# Patient Record
Sex: Male | Born: 1940
Health system: Southern US, Community
[De-identification: ages and names within clinical notes are randomized; demographics above are authoritative.]

## PROBLEM LIST (undated history)

## (undated) DIAGNOSIS — M5416 Radiculopathy, lumbar region: Secondary | ICD-10-CM

## (undated) DIAGNOSIS — C259 Malignant neoplasm of pancreas, unspecified: Secondary | ICD-10-CM

## (undated) DIAGNOSIS — R1900 Intra-abdominal and pelvic swelling, mass and lump, unspecified site: Secondary | ICD-10-CM

## (undated) DIAGNOSIS — K8689 Other specified diseases of pancreas: Secondary | ICD-10-CM

## (undated) DIAGNOSIS — C3490 Malignant neoplasm of unspecified part of unspecified bronchus or lung: Secondary | ICD-10-CM

## (undated) DIAGNOSIS — M1990 Unspecified osteoarthritis, unspecified site: Secondary | ICD-10-CM

## (undated) DIAGNOSIS — I639 Cerebral infarction, unspecified: Secondary | ICD-10-CM

## (undated) DIAGNOSIS — I8290 Acute embolism and thrombosis of unspecified vein: Secondary | ICD-10-CM

## (undated) DIAGNOSIS — I82409 Acute embolism and thrombosis of unspecified deep veins of unspecified lower extremity: Secondary | ICD-10-CM

## (undated) DIAGNOSIS — I10 Essential (primary) hypertension: Secondary | ICD-10-CM

## (undated) DIAGNOSIS — E119 Type 2 diabetes mellitus without complications: Secondary | ICD-10-CM

## (undated) DIAGNOSIS — J439 Emphysema, unspecified: Secondary | ICD-10-CM

## (undated) DIAGNOSIS — I700 Atherosclerosis of aorta: Secondary | ICD-10-CM

## (undated) HISTORY — DX: Cerebral infarction, unspecified: I63.9

## (undated) HISTORY — DX: Acute embolism and thrombosis of unspecified deep veins of unspecified lower extremity: I82.409

## (undated) HISTORY — DX: Atherosclerosis of aorta: I70.0

## (undated) HISTORY — PX: CYST REMOVAL LEG: SHX6280

## (undated) HISTORY — DX: Malignant neoplasm of unspecified part of unspecified bronchus or lung: C34.90

## (undated) HISTORY — PX: TONSILLECTOMY: SUR1361

## (undated) HISTORY — DX: Unspecified osteoarthritis, unspecified site: M19.90

## (undated) HISTORY — DX: Emphysema, unspecified: J43.9

## (undated) HISTORY — PX: APPENDECTOMY: SHX54

---

## 2001-10-27 ENCOUNTER — Ambulatory Visit (HOSPITAL_COMMUNITY): Admission: AD | Admit: 2001-10-27 | Discharge: 2001-10-27 | Payer: Self-pay | Admitting: General Surgery

## 2001-10-27 ENCOUNTER — Encounter: Payer: Self-pay | Admitting: General Surgery

## 2003-02-25 ENCOUNTER — Inpatient Hospital Stay (HOSPITAL_COMMUNITY): Admission: EM | Admit: 2003-02-25 | Discharge: 2003-02-28 | Payer: Self-pay | Admitting: Emergency Medicine

## 2003-02-26 ENCOUNTER — Encounter: Payer: Self-pay | Admitting: Emergency Medicine

## 2003-03-07 ENCOUNTER — Encounter: Admission: RE | Admit: 2003-03-07 | Discharge: 2003-03-07 | Payer: Self-pay | Admitting: Infectious Diseases

## 2003-03-14 ENCOUNTER — Encounter: Admission: RE | Admit: 2003-03-14 | Discharge: 2003-03-14 | Payer: Self-pay | Admitting: Internal Medicine

## 2003-03-21 ENCOUNTER — Encounter: Admission: RE | Admit: 2003-03-21 | Discharge: 2003-03-21 | Payer: Self-pay | Admitting: Internal Medicine

## 2003-04-07 ENCOUNTER — Encounter: Admission: RE | Admit: 2003-04-07 | Discharge: 2003-04-07 | Payer: Self-pay | Admitting: Internal Medicine

## 2003-04-18 ENCOUNTER — Encounter: Admission: RE | Admit: 2003-04-18 | Discharge: 2003-04-18 | Payer: Self-pay | Admitting: Internal Medicine

## 2003-05-02 ENCOUNTER — Encounter: Admission: RE | Admit: 2003-05-02 | Discharge: 2003-05-02 | Payer: Self-pay | Admitting: Internal Medicine

## 2003-05-16 ENCOUNTER — Encounter: Admission: RE | Admit: 2003-05-16 | Discharge: 2003-05-16 | Payer: Self-pay | Admitting: Internal Medicine

## 2003-06-06 ENCOUNTER — Encounter: Admission: RE | Admit: 2003-06-06 | Discharge: 2003-06-06 | Payer: Self-pay | Admitting: Internal Medicine

## 2003-06-27 ENCOUNTER — Encounter: Admission: RE | Admit: 2003-06-27 | Discharge: 2003-06-27 | Payer: Self-pay | Admitting: Internal Medicine

## 2003-07-25 ENCOUNTER — Encounter: Admission: RE | Admit: 2003-07-25 | Discharge: 2003-07-25 | Payer: Self-pay | Admitting: Internal Medicine

## 2003-08-15 ENCOUNTER — Encounter: Admission: RE | Admit: 2003-08-15 | Discharge: 2003-08-15 | Payer: Self-pay | Admitting: Internal Medicine

## 2003-09-07 ENCOUNTER — Encounter: Admission: RE | Admit: 2003-09-07 | Discharge: 2003-09-07 | Payer: Self-pay | Admitting: Internal Medicine

## 2003-09-12 ENCOUNTER — Encounter: Admission: RE | Admit: 2003-09-12 | Discharge: 2003-09-12 | Payer: Self-pay | Admitting: Internal Medicine

## 2003-10-10 ENCOUNTER — Encounter: Admission: RE | Admit: 2003-10-10 | Discharge: 2003-10-10 | Payer: Self-pay | Admitting: Internal Medicine

## 2003-10-13 ENCOUNTER — Encounter: Admission: RE | Admit: 2003-10-13 | Discharge: 2003-10-13 | Payer: Self-pay | Admitting: Internal Medicine

## 2013-11-26 ENCOUNTER — Encounter (HOSPITAL_COMMUNITY): Payer: Self-pay | Admitting: Emergency Medicine

## 2013-11-26 ENCOUNTER — Inpatient Hospital Stay (HOSPITAL_COMMUNITY)
Admission: EM | Admit: 2013-11-26 | Discharge: 2013-11-30 | DRG: 041 | Disposition: A | Discharge status: Rehabilitation Facility | Payer: Medicare Other | Attending: Family Medicine | Admitting: Family Medicine

## 2013-11-26 ENCOUNTER — Emergency Department (HOSPITAL_COMMUNITY): Payer: Medicare Other

## 2013-11-26 DIAGNOSIS — R29810 Facial weakness: Secondary | ICD-10-CM | POA: Diagnosis present

## 2013-11-26 DIAGNOSIS — E119 Type 2 diabetes mellitus without complications: Secondary | ICD-10-CM | POA: Diagnosis present

## 2013-11-26 DIAGNOSIS — I6340 Cerebral infarction due to embolism of unspecified cerebral artery: Principal | ICD-10-CM | POA: Diagnosis present

## 2013-11-26 DIAGNOSIS — G8190 Hemiplegia, unspecified affecting unspecified side: Secondary | ICD-10-CM | POA: Diagnosis present

## 2013-11-26 DIAGNOSIS — E785 Hyperlipidemia, unspecified: Secondary | ICD-10-CM | POA: Diagnosis present

## 2013-11-26 DIAGNOSIS — I639 Cerebral infarction, unspecified: Secondary | ICD-10-CM | POA: Diagnosis present

## 2013-11-26 DIAGNOSIS — R5381 Other malaise: Secondary | ICD-10-CM | POA: Diagnosis not present

## 2013-11-26 DIAGNOSIS — E875 Hyperkalemia: Secondary | ICD-10-CM | POA: Diagnosis present

## 2013-11-26 DIAGNOSIS — R4789 Other speech disturbances: Secondary | ICD-10-CM | POA: Diagnosis present

## 2013-11-26 DIAGNOSIS — R29818 Other symptoms and signs involving the nervous system: Secondary | ICD-10-CM | POA: Diagnosis not present

## 2013-11-26 DIAGNOSIS — Z5189 Encounter for other specified aftercare: Secondary | ICD-10-CM | POA: Diagnosis not present

## 2013-11-26 DIAGNOSIS — F17200 Nicotine dependence, unspecified, uncomplicated: Secondary | ICD-10-CM | POA: Diagnosis present

## 2013-11-26 DIAGNOSIS — I6350 Cerebral infarction due to unspecified occlusion or stenosis of unspecified cerebral artery: Secondary | ICD-10-CM | POA: Diagnosis not present

## 2013-11-26 DIAGNOSIS — G8110 Spastic hemiplegia affecting unspecified side: Secondary | ICD-10-CM | POA: Diagnosis not present

## 2013-11-26 DIAGNOSIS — R29898 Other symptoms and signs involving the musculoskeletal system: Secondary | ICD-10-CM | POA: Diagnosis not present

## 2013-11-26 DIAGNOSIS — I517 Cardiomegaly: Secondary | ICD-10-CM | POA: Diagnosis not present

## 2013-11-26 DIAGNOSIS — I10 Essential (primary) hypertension: Secondary | ICD-10-CM | POA: Diagnosis present

## 2013-11-26 DIAGNOSIS — I6330 Cerebral infarction due to thrombosis of unspecified cerebral artery: Secondary | ICD-10-CM | POA: Diagnosis not present

## 2013-11-26 DIAGNOSIS — Z86718 Personal history of other venous thrombosis and embolism: Secondary | ICD-10-CM

## 2013-11-26 DIAGNOSIS — Z79899 Other long term (current) drug therapy: Secondary | ICD-10-CM

## 2013-11-26 DIAGNOSIS — Z7982 Long term (current) use of aspirin: Secondary | ICD-10-CM

## 2013-11-26 DIAGNOSIS — R5383 Other fatigue: Secondary | ICD-10-CM | POA: Diagnosis not present

## 2013-11-26 DIAGNOSIS — B370 Candidal stomatitis: Secondary | ICD-10-CM | POA: Diagnosis not present

## 2013-11-26 HISTORY — DX: Acute embolism and thrombosis of unspecified vein: I82.90

## 2013-11-26 HISTORY — DX: Unspecified osteoarthritis, unspecified site: M19.90

## 2013-11-26 HISTORY — DX: Essential (primary) hypertension: I10

## 2013-11-26 HISTORY — DX: Type 2 diabetes mellitus without complications: E11.9

## 2013-11-26 HISTORY — DX: Cerebral infarction, unspecified: I63.9

## 2013-11-26 LAB — BASIC METABOLIC PANEL
BUN: 9 mg/dL (ref 6–23)
CALCIUM: 9.4 mg/dL (ref 8.4–10.5)
CO2: 22 mEq/L (ref 19–32)
CREATININE: 0.89 mg/dL (ref 0.50–1.35)
Chloride: 102 mEq/L (ref 96–112)
GFR calc non Af Amer: 82 mL/min — ABNORMAL LOW (ref >90)
Glucose, Bld: 117 mg/dL — ABNORMAL HIGH (ref 70–99)
Potassium: 3.9 mEq/L (ref 3.7–5.3)
Sodium: 140 mEq/L (ref 137–147)

## 2013-11-26 LAB — CBC
HEMATOCRIT: 43.7 % (ref 39.0–52.0)
Hemoglobin: 14.5 g/dL (ref 13.0–17.0)
MCH: 26.4 pg (ref 26.0–34.0)
MCHC: 33.2 g/dL (ref 30.0–36.0)
MCV: 79.5 fL (ref 78.0–100.0)
PLATELETS: 327 10*3/uL (ref 150–400)
RBC: 5.5 MIL/uL (ref 4.22–5.81)
RDW: 16.1 % — AB (ref 11.5–15.5)
WBC: 8.6 10*3/uL (ref 4.0–10.5)

## 2013-11-26 LAB — RAPID URINE DRUG SCREEN, HOSP PERFORMED
Amphetamines: NOT DETECTED
BARBITURATES: NOT DETECTED
Benzodiazepines: NOT DETECTED
COCAINE: NOT DETECTED
Opiates: NOT DETECTED
TETRAHYDROCANNABINOL: NOT DETECTED

## 2013-11-26 LAB — DIFFERENTIAL
BASOS PCT: 0 % (ref 0–1)
Basophils Absolute: 0 10*3/uL (ref 0.0–0.1)
Eosinophils Absolute: 0 10*3/uL (ref 0.0–0.7)
Eosinophils Relative: 0 % (ref 0–5)
LYMPHS PCT: 24 % (ref 12–46)
Lymphs Abs: 2.1 10*3/uL (ref 0.7–4.0)
MONO ABS: 0.4 10*3/uL (ref 0.1–1.0)
Monocytes Relative: 5 % (ref 3–12)
NEUTROS PCT: 70 % (ref 43–77)
Neutro Abs: 6 10*3/uL (ref 1.7–7.7)

## 2013-11-26 LAB — URINALYSIS, ROUTINE W REFLEX MICROSCOPIC
Bilirubin Urine: NEGATIVE
Glucose, UA: NEGATIVE mg/dL
HGB URINE DIPSTICK: NEGATIVE
KETONES UR: NEGATIVE mg/dL
Nitrite: NEGATIVE
PROTEIN: NEGATIVE mg/dL
Specific Gravity, Urine: 1.019 (ref 1.005–1.030)
Urobilinogen, UA: 1 mg/dL (ref 0.0–1.0)
pH: 6 (ref 5.0–8.0)

## 2013-11-26 LAB — COMPREHENSIVE METABOLIC PANEL
ALK PHOS: 82 U/L (ref 39–117)
ALT: 18 U/L (ref 0–53)
AST: 25 U/L (ref 0–37)
Albumin: 3.9 g/dL (ref 3.5–5.2)
BILIRUBIN TOTAL: 0.6 mg/dL (ref 0.3–1.2)
BUN: 10 mg/dL (ref 6–23)
CO2: 21 meq/L (ref 19–32)
CREATININE: 0.85 mg/dL (ref 0.50–1.35)
Calcium: 9.5 mg/dL (ref 8.4–10.5)
Chloride: 101 mEq/L (ref 96–112)
GFR calc Af Amer: >90 mL/min (ref >90)
GFR, EST NON AFRICAN AMERICAN: 84 mL/min — AB (ref >90)
Glucose, Bld: 129 mg/dL — ABNORMAL HIGH (ref 70–99)
POTASSIUM: 5.3 meq/L (ref 3.7–5.3)
SODIUM: 138 meq/L (ref 137–147)
Total Protein: 8.3 g/dL (ref 6.0–8.3)

## 2013-11-26 LAB — URINE MICROSCOPIC-ADD ON

## 2013-11-26 LAB — ETHANOL: Alcohol, Ethyl (B): <11 mg/dL (ref 0–11)

## 2013-11-26 LAB — APTT: APTT: 33 s (ref 24–37)

## 2013-11-26 LAB — I-STAT TROPONIN, ED: TROPONIN I, POC: 0 ng/mL (ref 0.00–0.08)

## 2013-11-26 LAB — PROTIME-INR
INR: 1.09 (ref 0.00–1.49)
Prothrombin Time: 13.9 seconds (ref 11.6–15.2)

## 2013-11-26 MED ORDER — ENOXAPARIN SODIUM 40 MG/0.4ML ~~LOC~~ SOLN
40.0000 mg | SUBCUTANEOUS | Status: DC
  Administered 2013-11-27 – 2013-11-28 (×2): 40 mg via SUBCUTANEOUS
  Filled 2013-11-26 (×3): qty 0.4

## 2013-11-26 MED ORDER — ASPIRIN 325 MG PO TABS
325.0000 mg | ORAL_TABLET | Freq: Every day | ORAL | Status: DC
  Administered 2013-11-26 – 2013-11-30 (×5): 325 mg via ORAL
  Filled 2013-11-26 (×5): qty 1

## 2013-11-26 MED ORDER — HYDRALAZINE HCL 20 MG/ML IJ SOLN
5.0000 mg | Freq: Once | INTRAMUSCULAR | Status: AC
  Administered 2013-11-27: 5 mg via INTRAVENOUS
  Filled 2013-11-26: qty 1

## 2013-11-26 MED ORDER — HYDRALAZINE HCL 20 MG/ML IJ SOLN: 10.0000 mg | Freq: Four times a day (QID) | INTRAMUSCULAR | Status: DC | PRN

## 2013-11-26 MED ORDER — HYDRALAZINE HCL 20 MG/ML IJ SOLN
5.0000 mg | Freq: Four times a day (QID) | INTRAMUSCULAR | Status: DC | PRN
  Administered 2013-11-26: 21:00:00 via INTRAVENOUS
  Filled 2013-11-26: qty 0.25

## 2013-11-26 NOTE — Progress Notes (Signed)
  CARE MANAGEMENT ED NOTE 11/26/2013  Patient:  NORI, POLAND   Account Number:  [id number]  Date Initiated:  11/26/2013  Documentation initiated by:  Livia Snellen  Subjective/Objective Assessment:   Patient presents to Ed with left sided weakness and difficulty ambulating.     Subjective/Objective Assessment Detail:     Action/Plan:   Action/Plan Detail:   Anticipated DC Date:       Status Recommendation to Physician:   Result of Recommendation:    Other ED Services  Consult Working [place]  CM consult  Other    Choice offered to / List presented to:  C-4 Adult Children          Status of service:  Completed, signed off  ED Comments:   ED Comments Detail:  EDCM spoke to patient and his daughter Tyrel Lex.  Pam's phone number [phone].  Patient lives at home alone. Patient reports he does not have any medical equipment at home and does not have any home health services at this time.  Patient reports he is usually able to perform his ADL's without difficulty however he requires assistance now because, "I cannt use this left arm."  [facility] provided patient with printed list of home health agencies in [facility].  [facility] informed patient that with home health, the patient may receive a visiting RN, PT, OT, aid and social worker if needed.   EDCM also provided patient a list of private duty nursing agencies and explained it would be an out of pocket expense for patinet.  Also provided patient with printed information about the [employer] [facility] [place].  All printed materials given to patient's daughter.  Patient and patient's daughter thankful for resources.  No further EDCM needs at this time.

## 2013-11-26 NOTE — ED Provider Notes (Signed)
Medical screening examination/treatment/procedure(s) were performed by non-physician practitioner and as supervising physician I was immediately available for consultation/collaboration.   EKG Interpretation   Date/Time:  Friday Nov 26 2013 17:45:27 EDT Ventricular Rate:  95 PR Interval:  197 QRS Duration: 123 QT Interval:  409 QTC Calculation: 514 R Axis:   -48 Text Interpretation:  Sinus rhythm Nonspecific IVCD with LAD LVH with  secondary repolarization abnormality Anterolateral Q waves, probably due  to LVH Baseline wander in lead(s) V5 Confirmed by Zenia Resides  MD, Jaquala Fuller  ([id number]) on 11/26/2013 5:48:45 PM        Leota Jacobsen, MD 11/26/13 2308

## 2013-11-26 NOTE — ED Notes (Signed)
Patient lives by himself and started noticing some left sided weakness.  He went to bed then this morning noticed slurred speech and his left sided weakness had worsened.  Patient has no history of previous stroke.

## 2013-11-26 NOTE — ED Provider Notes (Signed)
Medical screening examination/treatment/procedure(s) were conducted as a shared visit with non-physician practitioner(s) and myself.  I personally evaluated the patient during the encounter.   EKG Interpretation   Date/Time:  Friday Nov 26 2013 17:45:27 EDT Ventricular Rate:  95 PR Interval:  197 QRS Duration: 123 QT Interval:  409 QTC Calculation: 514 R Axis:   -48 Text Interpretation:  Sinus rhythm Nonspecific IVCD with LAD LVH with  secondary repolarization abnormality Anterolateral Q waves, probably due  to LVH Baseline wander in lead(s) V5 Confirmed by Candyce Gambino  MD, Teren Franckowiak  ([id number]) on 11/26/2013 5:48:45 PM     Patient here complaining of left-sided weakness that began yesterday. He's had trouble walking. On physical exam, he has 3/5 strength in his left upper extremity. No slurred speech. Will obtain head CT. Suspect that patient has had a stroke and will require hospitalization.  Leota Jacobsen, MD 11/26/13 [phone]

## 2013-11-26 NOTE — ED Notes (Signed)
Browning PA made aware of patient chem 8 results.

## 2013-11-26 NOTE — H&P (Signed)
[facility] History and Physical  Scott Conley OEV:[id number] DOB: 06/12/1941 DOA: 11/26/2013  Referring physician: ED physician PCP: No primary provider on file.   Chief Complaint:   HPI:  Pt is 73 yo male who presented to [facility] ED with main concern of sudden onset of left sided weakness and slurred speech and was last seen at his baseline 6 pm night prior to this admission. Pt says he woke up with slurred speech and has noticed weakness was getting worse on the left side. He denies similar events in the past, no chest pain or shortness of breath, no abdominal or urinary concerns.   In ED, pt is hemodynamically stable but SBP noted to be in 200's. Initial BMP unremarkable and I-STAT BMP with K 6.6. CT Head with small age-indeterminate high right frontoparietal cortical infarct. TRH asked to admit to [facility] hospital for stroke work up.   Assessment and Plan: Active Problems: Left sided weakness, slurred speech - will transfer to [facility] for further evaluation - MRI/MRA brain for further evaluation - lipid panel and A1C ordered, 2 D ECHO and carotid doppler - PT/OT/SLP - aspirin 325 mg PO given in ED - will continue once passes swallow evaluation  - neurology consulted, appreciate input  Hyperkalemia - will repeat BMP to ensure no blood error  Accelerated HTN - with SBP in 200 on arrival to ED - will place on Hydralazine IV scheduled and as needed for now - will need antihypertensive regimen adjusted but will avoid lowering BP too fast at this point  Radiological Exams on Admission: Ct Head Wo Contrast  11/26/2013   Small age-indeterminate high right frontoparietal cortical infarct - no evidence of hemorrhage.  Chronic small-vessel white matter ischemic changes.    Code Status: Full Family Communication: Pt at bedside Disposition Plan: Admit for further evaluation     Review of Systems:  Constitutional:  Negative for diaphoresis.  HENT: Negative for hearing loss, congestion,  sore throat, neck pain, tinnitus and ear discharge.   Eyes: Negative for blurred vision, double vision, photophobia, pain, discharge and redness.  Respiratory: Negative for cough, hemoptysis, sputum production, shortness of breath, wheezing and stridor.   Cardiovascular: Negative for chest pain, palpitations, orthopnea, claudication and leg swelling.  Gastrointestinal: Negative for heartburn, constipation, blood in stool and melena.  Genitourinary: Negative for dysuria, urgency, frequency, hematuria and flank pain.  Musculoskeletal: Negative for myalgias, back pain, joint pain and falls.  Skin: Negative for itching and rash.  Neurological: Negative for tingling, tremors, loss of consciousness and headaches.  Endo/Heme/Allergies: Negative for environmental allergies and polydipsia. Does not bruise/bleed easily.  Psychiatric/Behavioral: Negative for suicidal ideas. The patient is not nervous/anxious.      Past Medical History  Diagnosis Date  . Blood clot in vein   . Arthritis     Past Surgical History  Procedure Laterality Date  . Cyst removal leg      removed from right groin    Social History:  reports that he has been smoking Cigarettes.  He has a 29 pack-year smoking history. He has never used smokeless tobacco. He reports that he does not drink alcohol or use illicit drugs.  No Known Allergies  No known family medical history   Prior to Admission medications   Medication Sig Start Date End Date Taking? Authorizing Provider  diphenhydrAMINE (BENADRYL) 25 mg capsule Take 25 mg by mouth at bedtime as needed for sleep.   Yes Historical Provider, MD    Physical Exam: Filed Vitals:  11/26/13 1741 11/26/13 1745 11/26/13 1800 11/26/13 1900  BP:  192/96 181/94 178/91  Pulse:  95 95 94  Temp: 97.7 F (36.5 C)     TempSrc:      Resp:  0 18 19  Height:      Weight:      SpO2:  97% 97% 97%    Physical Exam  Constitutional: Appears well-developed and well-nourished. No  distress.  HENT: Normocephalic. External right and left ear normal. Oropharynx is clear and moist.  Eyes: Conjunctivae and EOM are normal. PERRLA, no scleral icterus.  Neck: Normal ROM. Neck supple. No JVD. No tracheal deviation. No thyromegaly.  CVS: RRR, S1/S2 +, no murmurs, no gallops, no carotid bruit.  Pulmonary: Effort and breath sounds normal, no stridor, rhonchi, wheezes, rales.  Abdominal: Soft. BS +,  no distension, tenderness, rebound or guarding.  Musculoskeletal: Normal range of motion. No edema and no tenderness.  Lymphadenopathy: No lymphadenopathy noted, cervical, inguinal. Neuro: Alert, follows commands appropriately, mild aphasia noted, CN II - XII intact except asymmetric smile, left upper and lower extremity strength 4/5 with decreased sensation to light touch, strength in right upper and lower extremity 5/5 with intact sensation to light touch.  Skin: Skin is warm and dry. No rash noted. Not diaphoretic. No erythema. No pallor.  Psychiatric: Normal mood and affect.  Labs on Admission:  Basic Metabolic Panel:  Recent Labs Lab 11/26/13 1730 11/26/13 1738  NA 138 137  K 5.3 6.6*  CL 101 106  CO2 21  --   GLUCOSE 129* 128*  BUN 10 11  CREATININE 0.85 1.00  CALCIUM 9.5  --    Liver Function Tests:  Recent Labs Lab 11/26/13 1730  AST 25  ALT 18  ALKPHOS 82  BILITOT 0.6  PROT 8.3  ALBUMIN 3.9   CBC:  Recent Labs Lab 11/26/13 1730 11/26/13 1738  WBC 8.6  --   NEUTROABS 6.0  --   HGB 14.5 16.3  HCT 43.7 48.0  MCV 79.5  --   PLT 327  --     EKG: Normal sinus rhythm, no ST/T wave changes  [name], MD  [facility] Pager [phone]  If 7PM-7AM, please contact night-coverage [email or web address] Password [email or web address] 11/26/2013, 8:01 PM

## 2013-11-26 NOTE — Progress Notes (Addendum)
Clinical Social Work Department BRIEF PSYCHOSOCIAL ASSESSMENT 11/26/2013  Patient:  Scott Conley, Scott Conley     Account Number:  [id number]     Admit date:  11/26/2013  Clinical Social Worker:  Luretha Rued  Date/Time:  11/26/2013 07:00 PM  Referred by:  CSW  Date Referred:  11/26/2013  Other Referral:   Interview type:  Patient Other interview type:   Daughter at bedside    PSYCHOSOCIAL DATA Living Status:  ALONE Admitted from facility:   Level of care:   Primary support name:  Jerrol Helmers Primary support relationship to patient:  CHILD, ADULT Degree of support available:   High level of support    CURRENT CONCERNS  Other Concerns:    SOCIAL WORK ASSESSMENT / PLAN CSW met with the patient and daughter at bedside to complete this assessment. Patient appears alert, oriented x4, calm, and cooperative.   Patient currently lives alone but has his daughters that lives close that frequently check on him.  Patient do not have a POA at this time but his family is very involved with his safety.   Assessment/plan status:  Psychosocial Support/Ongoing Assessment of Needs Other assessment/ plan:   Information/referral to community resources:   SNF    PATIENT'S/FAMILY'S RESPONSE TO PLAN OF CARE: Patient and family expressed their appreciation for the support of the social work department.     Chesley Noon, MSW, [place], 11/26/2013 Evening Clinical Social Worker [phone]

## 2013-11-26 NOTE — ED Provider Notes (Signed)
CSN: [id number]     Arrival date & time 11/26/13  1659 History   First MD Initiated Contact with Patient 11/26/13 1715     Chief Complaint  Patient presents with  . Extremity Weakness     (Consider location/radiation/quality/duration/timing/severity/associated sxs/prior Treatment) HPI Comments: Patient presents to the emergency department with chief complaint of left-sided weakness and slurred speech. He was last known normal last night at 6:00.  This is when the weakness began.  Patient states that he awoke this morning and had some slurred speech.  He denies any history of stroke.  He denies any chest pain, SOB, or abdominal pain.  He has not taken anything to alleviate his symptoms.  There are no aggravating or alleviating factors.  The history is provided by the patient. No language interpreter was used.    Past Medical History  Diagnosis Date  . Blood clot in vein    History reviewed. No pertinent past surgical history. No family history on file. History  Substance Use Topics  . Smoking status: Light Tobacco Smoker    Types: Cigarettes  . Smokeless tobacco: Not on file  . Alcohol Use: No    Review of Systems  Constitutional: Negative for fever and chills.  Respiratory: Negative for shortness of breath.   Cardiovascular: Negative for chest pain.  Gastrointestinal: Negative for nausea, vomiting, diarrhea and constipation.  Genitourinary: Negative for dysuria.  Neurological: Positive for facial asymmetry and weakness.      Allergies  Review of patient's allergies indicates not on file.  Home Medications   Prior to Admission medications   Not on File   BP 192/99  Pulse 99  Temp(Src) 97.7 F (36.5 C) (Oral)  Ht 6\' 4"  (1.93 m)  Wt 235 lb (106.595 kg)  BMI 28.62 kg/m2  SpO2 96% Physical Exam  Nursing note and vitals reviewed. Constitutional: He is oriented to person, place, and time. He appears well-developed and well-nourished.  HENT:  Head: Normocephalic and  atraumatic.  asymmetric smile  Eyes: Conjunctivae and EOM are normal. Pupils are equal, round, and reactive to light. Right eye exhibits no discharge. Left eye exhibits no discharge. No scleral icterus.  Neck: Normal range of motion. Neck supple. No JVD present.  Cardiovascular: Normal rate, regular rhythm and normal heart sounds.  Exam reveals no gallop and no friction rub.   No murmur heard. Pulmonary/Chest: Effort normal and breath sounds normal. No respiratory distress. He has no wheezes. He has no rales. He exhibits no tenderness.  Abdominal: Soft. He exhibits no distension and no mass. There is no tenderness. There is no rebound and no guarding.  Musculoskeletal: Normal range of motion. He exhibits no edema and no tenderness.  4/5 ROM and strength in left upper and lower extremities  Neurological: He is alert and oriented to person, place, and time.  No sensation in left upper and lower extremities   Skin: Skin is warm and dry.  Psychiatric: He has a normal mood and affect. His behavior is normal. Judgment and thought content normal.    ED Course  Procedures (including critical care time) Results for orders placed during the hospital encounter of 11/26/13  ETHANOL      Result Value Ref Range   Alcohol, Ethyl (B) <11  0 - 11 mg/dL  PROTIME-INR      Result Value Ref Range   Prothrombin Time 13.9  11.6 - 15.2 seconds   INR 1.09  0.00 - 1.49  APTT      Result  Value Ref Range   aPTT 33  24 - 37 seconds  CBC      Result Value Ref Range   WBC 8.6  4.0 - 10.5 K/uL   RBC 5.50  4.22 - 5.81 MIL/uL   Hemoglobin 14.5  13.0 - 17.0 g/dL   HCT 43.7  39.0 - 52.0 %   MCV 79.5  78.0 - 100.0 fL   MCH 26.4  26.0 - 34.0 pg   MCHC 33.2  30.0 - 36.0 g/dL   RDW 16.1 (*) 11.5 - 15.5 %   Platelets 327  150 - 400 K/uL  DIFFERENTIAL      Result Value Ref Range   Neutrophils Relative % 70  43 - 77 %   Neutro Abs 6.0  1.7 - 7.7 K/uL   Lymphocytes Relative 24  12 - 46 %   Lymphs Abs 2.1  0.7 -  4.0 K/uL   Monocytes Relative 5  3 - 12 %   Monocytes Absolute 0.4  0.1 - 1.0 K/uL   Eosinophils Relative 0  0 - 5 %   Eosinophils Absolute 0.0  0.0 - 0.7 K/uL   Basophils Relative 0  0 - 1 %   Basophils Absolute 0.0  0.0 - 0.1 K/uL  COMPREHENSIVE METABOLIC PANEL      Result Value Ref Range   Sodium 138  137 - 147 mEq/L   Potassium 5.3  3.7 - 5.3 mEq/L   Chloride 101  96 - 112 mEq/L   CO2 21  19 - 32 mEq/L   Glucose, Bld 129 (*) 70 - 99 mg/dL   BUN 10  6 - 23 mg/dL   Creatinine, Ser 0.85  0.50 - 1.35 mg/dL   Calcium 9.5  8.4 - 10.5 mg/dL   Total Protein 8.3  6.0 - 8.3 g/dL   Albumin 3.9  3.5 - 5.2 g/dL   AST 25  0 - 37 U/L   ALT 18  0 - 53 U/L   Alkaline Phosphatase 82  39 - 117 U/L   Total Bilirubin 0.6  0.3 - 1.2 mg/dL   GFR calc non Af Amer 84 (*) >90 mL/min   GFR calc Af Amer >90  >90 mL/min  URINE RAPID DRUG SCREEN (HOSP PERFORMED)      Result Value Ref Range   Opiates NONE DETECTED  NONE DETECTED   Cocaine NONE DETECTED  NONE DETECTED   Benzodiazepines NONE DETECTED  NONE DETECTED   Amphetamines NONE DETECTED  NONE DETECTED   Tetrahydrocannabinol NONE DETECTED  NONE DETECTED   Barbiturates NONE DETECTED  NONE DETECTED  URINALYSIS, ROUTINE W REFLEX MICROSCOPIC      Result Value Ref Range   Color, Urine YELLOW  YELLOW   APPearance CLEAR  CLEAR   Specific Gravity, Urine 1.019  1.005 - 1.030   pH 6.0  5.0 - 8.0   Glucose, UA NEGATIVE  NEGATIVE mg/dL   Hgb urine dipstick NEGATIVE  NEGATIVE   Bilirubin Urine NEGATIVE  NEGATIVE   Ketones, ur NEGATIVE  NEGATIVE mg/dL   Protein, ur NEGATIVE  NEGATIVE mg/dL   Urobilinogen, UA 1.0  0.0 - 1.0 mg/dL   Nitrite NEGATIVE  NEGATIVE   Leukocytes, UA TRACE (*) NEGATIVE  URINE MICROSCOPIC-ADD ON      Result Value Ref Range   Squamous Epithelial / LPF RARE  RARE   WBC, UA 0-2  <3 WBC/hpf  I-STAT CHEM 8, ED      Result Value Ref Range  Sodium 137  137 - 147 mEq/L   Potassium 6.6 (*) 3.7 - 5.3 mEq/L   Chloride 106  96 - 112  mEq/L   BUN 11  6 - 23 mg/dL   Creatinine, Ser 1.00  0.50 - 1.35 mg/dL   Glucose, Bld 128 (*) 70 - 99 mg/dL   Calcium, Ion 1.02 (*) 1.13 - 1.30 mmol/L   TCO2 23  0 - 100 mmol/L   Hemoglobin 16.3  13.0 - 17.0 g/dL   HCT 48.0  39.0 - 52.0 %   Comment NOTIFIED PHYSICIAN    I-STAT TROPOININ, ED      Result Value Ref Range   Troponin i, poc 0.00  0.00 - 0.08 ng/mL   Comment 3            Ct Head Wo Contrast  11/26/2013   CLINICAL DATA:  73 year old with left-sided arm and leg weakness for 1 day.  EXAM: CT HEAD WITHOUT CONTRAST  TECHNIQUE: Contiguous axial images were obtained from the base of the skull through the vertex without intravenous contrast.  COMPARISON:  None.  FINDINGS: There is a small high right frontoparietal cortical infarct which is age-indeterminate.  Mild chronic small-vessel white matter ischemic changes are present.  There is no evidence of mass lesion or mass effect, hydrocephalus, extra-axial fluid collection, midline shift or hemorrhage.  The visualized bony calvarium is unremarkable.  IMPRESSION: Small age-indeterminate high right frontoparietal cortical infarct - no evidence of hemorrhage.  Chronic small-vessel white matter ischemic changes.   Electronically Signed   By: Hassan Rowan M.D.   On: 11/26/2013 18:45      EKG Interpretation None      MDM   Final diagnoses:  Stroke    Patient with stroke symptoms.  Will check labs.  Not a candidate for TPA because onset was yesterday at 6:00pm.  Patient discussed with Dr. Zenia Resides.  5:55 PM Patient seen by and discussed with Dr. Zenia Resides, who agrees that the patient will need to be admitted.  K is 6.6 on i-stat.  Suspect hemolysis.  Will repeat CMP.  7:55 PM Patient discussed with hospitalist, as well as neurologist. Burnis Medin transfer the patient to the [facility].    Montine Circle, PA-C 11/26/13 1955

## 2013-11-27 ENCOUNTER — Inpatient Hospital Stay (HOSPITAL_COMMUNITY): Payer: Medicare Other

## 2013-11-27 DIAGNOSIS — I6350 Cerebral infarction due to unspecified occlusion or stenosis of unspecified cerebral artery: Secondary | ICD-10-CM | POA: Diagnosis not present

## 2013-11-27 DIAGNOSIS — I517 Cardiomegaly: Secondary | ICD-10-CM | POA: Diagnosis not present

## 2013-11-27 DIAGNOSIS — R5381 Other malaise: Secondary | ICD-10-CM | POA: Diagnosis not present

## 2013-11-27 LAB — HEMOGLOBIN A1C
Hgb A1c MFr Bld: 7.4 % — ABNORMAL HIGH (ref <5.7)
Mean Plasma Glucose: 166 mg/dL — ABNORMAL HIGH (ref <117)

## 2013-11-27 LAB — LIPID PANEL
Cholesterol: 196 mg/dL (ref 0–200)
HDL: 43 mg/dL (ref >39)
LDL CALC: 135 mg/dL — AB (ref 0–99)
Total CHOL/HDL Ratio: 4.6 RATIO
Triglycerides: 89 mg/dL (ref <150)
VLDL: 18 mg/dL (ref 0–40)

## 2013-11-27 MED ORDER — ATORVASTATIN CALCIUM 80 MG PO TABS
80.0000 mg | ORAL_TABLET | Freq: Every day | ORAL | Status: DC
  Administered 2013-11-27 – 2013-11-29 (×3): 80 mg via ORAL
  Filled 2013-11-27 (×4): qty 1

## 2013-11-27 MED ORDER — STROKE: EARLY STAGES OF RECOVERY BOOK
Freq: Once | Status: AC
  Administered 2013-11-29: 07:00:00
  Filled 2013-11-27: qty 1

## 2013-11-27 MED ORDER — HYDRALAZINE HCL 20 MG/ML IJ SOLN: 10.0000 mg | Freq: Four times a day (QID) | INTRAMUSCULAR | Status: DC | PRN

## 2013-11-27 NOTE — Progress Notes (Signed)
Stroke Team Progress Note  HISTORY Scott Conley is a 73 y.o. male with a history of no new medical problems given that he does not go to physicians frequently. He went to bed yesterday evening (4/30). He woke the next morning with left-sided facial droop and arm weakness. He states that this is been a static deficit since he noticed it.  Patient was not administered TPA secondary to being outside the window. He was admitted to [facility] for further evaluation and treatment.  SUBJECTIVE Resting comfortably, no acute concerns at this time  OBJECTIVE Most recent Vital Signs: Filed Vitals:   11/27/13 0400 11/27/13 0602 11/27/13 0800 11/27/13 1000  BP: 157/82 169/83 175/83 181/94  Pulse: 90 95 94 93  Temp: 97.9 F (36.6 C)   98 F (36.7 C)  TempSrc: Oral   Oral  Resp: 18 18 18 18   Height:      Weight:  248 lb (112.492 kg)    SpO2: 100% 98% 95% 99%   CBG (last 3)  No results found for this basename: GLUCAP,  in the last 72 hours  IV Fluid Intake:     MEDICATIONS  . aspirin  325 mg Oral Daily  . atorvastatin  80 mg Oral q1800  . enoxaparin (LOVENOX) injection  40 mg Subcutaneous Q24H   PRN:  hydrALAZINE  Diet:  Cardiac  Activity:   Up with assistance DVT Prophylaxis: lovenox  CLINICALLY SIGNIFICANT STUDIES Basic Metabolic Panel:  Recent Labs Lab 11/26/13 1730 11/26/13 1738 11/26/13 2050  NA 138 137 140  K 5.3 6.6* 3.9  CL 101 106 102  CO2 21  --  22  GLUCOSE 129* 128* 117*  BUN 10 11 9   CREATININE 0.85 1.00 0.89  CALCIUM 9.5  --  9.4   Liver Function Tests:  Recent Labs Lab 11/26/13 1730  AST 25  ALT 18  ALKPHOS 82  BILITOT 0.6  PROT 8.3  ALBUMIN 3.9   CBC:  Recent Labs Lab 11/26/13 1730 11/26/13 1738  WBC 8.6  --   NEUTROABS 6.0  --   HGB 14.5 16.3  HCT 43.7 48.0  MCV 79.5  --   PLT 327  --    Coagulation:  Recent Labs Lab 11/26/13 1730  LABPROT 13.9  INR 1.09   Cardiac Enzymes: No results found for this basename: CKTOTAL, CKMB, CKMBINDEX,  TROPONINI,  in the last 168 hours Urinalysis:  Recent Labs Lab 11/26/13 1757  COLORURINE YELLOW  LABSPEC 1.019  PHURINE 6.0  GLUCOSEU NEGATIVE  HGBUR NEGATIVE  BILIRUBINUR NEGATIVE  KETONESUR NEGATIVE  PROTEINUR NEGATIVE  UROBILINOGEN 1.0  NITRITE NEGATIVE  LEUKOCYTESUR TRACE*   Lipid Panel    Component Value Date/Time   CHOL 196 11/27/2013 0521   TRIG 89 11/27/2013 0521   HDL 43 11/27/2013 0521   CHOLHDL 4.6 11/27/2013 0521   VLDL 18 11/27/2013 0521   LDLCALC 135* 11/27/2013 0521   HgbA1C  No results found for this basename: HGBA1C    Urine Drug Screen:     Component Value Date/Time   LABOPIA NONE DETECTED 11/26/2013 1757   COCAINSCRNUR NONE DETECTED 11/26/2013 1757   LABBENZ NONE DETECTED 11/26/2013 1757   AMPHETMU NONE DETECTED 11/26/2013 1757   THCU NONE DETECTED 11/26/2013 1757   LABBARB NONE DETECTED 11/26/2013 1757    Alcohol Level:  Recent Labs Lab 11/26/13 1730  ETH <11    Ct Head Wo Contrast  11/26/2013   CLINICAL DATA:  73 year old with left-sided arm and leg weakness for  1 day.  EXAM: CT HEAD WITHOUT CONTRAST  TECHNIQUE: Contiguous axial images were obtained from the base of the skull through the vertex without intravenous contrast.  COMPARISON:  None.  FINDINGS: There is a small high right frontoparietal cortical infarct which is age-indeterminate.  Mild chronic small-vessel white matter ischemic changes are present.  There is no evidence of mass lesion or mass effect, hydrocephalus, extra-axial fluid collection, midline shift or hemorrhage.  The visualized bony calvarium is unremarkable.  IMPRESSION: Small age-indeterminate high right frontoparietal cortical infarct - no evidence of hemorrhage.  Chronic small-vessel white matter ischemic changes.   Electronically Signed   By: Hassan Rowan M.D.   On: 11/26/2013 18:45    MRI of the brain    MRA of the brain    2D Echocardiogram    Carotid Doppler    CXR    EKG    Therapy Recommendations   Physical Exam   Mental  Status:  Patient is awake, alert, oriented to person, place, month, year, and situation.  Immediate and remote memory are intact.  Patient is able to give a clear and coherent history.  No signs of aphasia or neglect  Cranial Nerves:  II: Visual Fields are full. Pupils are equal, round, and reactive to light.  III,IV, VI: EOMI without ptosis or diploplia.  V: Facial sensation is symmetric to temperature  VII: Facial movement is notable for droop on left  VIII: hearing is intact to voice  X: Uvula elevates symmetrically  XI: Shoulder shrug is symmetric.  XII: tongue is midline without atrophy or fasciculations.  Motor:  Tone is normal. Bulk is normal. 5/5 strength was present on the right side, on the left he has a distal much greater than proximal weakness of the left upper extremity and possible very mild weakness of the left lower extremity(5-/5).  Sensory:  Sensation is symmetric to light touch and temperature in the arms and legs.  Deep Tendon Reflexes:  2+ and symmetric in the biceps and patellae.  Plantars:  Toes are downgoing bilaterally.    ASSESSMENT [name] is a 73 y.o. male presenting with left sided facial droop and LUE weakness. No TPA given as he is outside the window. CT head showed possible subacute infarct. On no anticoagulation prior to admission. Now on ASA 325mg  daily for secondary stroke prevention. Patient with resultant left sided weakness. Stroke work up underway.   CVA  LDL 135    Hospital day # 1  TREATMENT/PLAN  Continue ASA 325mg  daily  Lipitor 80mg  daily  MRI/A pending  2D echo and carotid doppler  Rehab consult  Hemoglobin A1c pending  Risk factor management   Jim Like, DO Neurology-Stroke    To contact Stroke Continuity provider, please refer to [email or web address] After hours, contact General Neurology

## 2013-11-27 NOTE — Consult Note (Signed)
Neurology Consultation Reason for Consult: Stroke Referring Physician: Doyle Askew  CC: Stroke  History is obtained from: Patient  HPI: Scott Conley is a 73 y.o. male with a history of no new medical problems given that he does not go to physicians frequently. He went to bed yesterday evening (4/30). He woke the next morning with left-sided facial droop and arm weakness. He states that this is been a static deficit since he noticed it.   LKW: 4/30 prior to bed tpa given?: no, outside of window    ROS: A 14 point ROS was performed and is negative except as noted in the HPI.  Past Medical History  Diagnosis Date  . Blood clot in vein   . Arthritis     Family History: No history of stroke  Social History: Tob: Current smoker  Exam: Current vital signs: BP 162/81  Pulse 88  Temp(Src) 98 F (36.7 C) (Oral)  Resp 16  Ht 6\' 4"  (1.93 m)  Wt 111.222 kg (245 lb 3.2 oz)  BMI 29.86 kg/m2  SpO2 100% Vital signs in last 24 hours: Temp:  [97.7 F (36.5 C)-98 F (36.7 C)] 98 F (36.7 C) (05/02 0000) Pulse Rate:  [88-104] 88 (05/02 0201) Resp:  [0-19] 16 (05/02 0201) BP: (162-201)/(81-99) 162/81 mmHg (05/02 0201) SpO2:  [96 %-100 %] 100 % (05/02 0201) Weight:  [106.595 kg (235 lb)-111.222 kg (245 lb 3.2 oz)] 111.222 kg (245 lb 3.2 oz) (05/01 2214)  General: In bed, NAD CV: Regular rate and rhythm Mental Status: Patient is awake, alert, oriented to person, place, month, year, and situation. Immediate and remote memory are intact. Patient is able to give a clear and coherent history. No signs of aphasia or neglect Cranial Nerves: II: Visual Fields are full. Pupils are equal, round, and reactive to light.  Discs are difficult to visualize. III,IV, VI: EOMI without ptosis or diploplia.  V: Facial sensation is symmetric to temperature VII: Facial movement is notable for droop on left VIII: hearing is intact to voice X: Uvula elevates symmetrically XI: Shoulder shrug is  symmetric. XII: tongue is midline without atrophy or fasciculations.  Motor: Tone is normal. Bulk is normal. 5/5 strength was present on the right side, on the left he has a distal much greater than proximal weakness of the left upper extremity and possible very mild weakness of the left lower extremity(5-/5). Sensory: Sensation is symmetric to light touch and temperature in the arms and legs. Deep Tendon Reflexes: 2+ and symmetric in the biceps and patellae.  Plantars: Toes are downgoing bilaterally.  Cerebellar: FNF with mild tremor on the right, consistent with weakness on left. Gait: Not performed secondary to patient safety concerns         I have reviewed labs in epic and the results pertinent to this consultation are: BMP unremarkable  I have reviewed the images obtained: CT head-hypodensity suggestive of a subacute infarct  Impression: 73 year old male with subacute infarct causing left arm and face weakness. He is being admitted for stroke workup  Recommendations: 1. HgbA1c, fasting lipid panel 2. MRI, MRA  of the brain without contrast 3. Frequent neuro checks 4. Echocardiogram 5. Carotid dopplers 6. Prophylactic therapy-Antiplatelet med: Aspirin - dose 325mg  PO or 300mg  PR 7. Risk factor modification 8. Telemetry monitoring 9. PT consult, OT consult, Speech consult    Roland Rack, MD [facility] [phone]  If 7pm- 7am, please page neurology on call as listed in [place].

## 2013-11-27 NOTE — Progress Notes (Signed)
Note: This document was prepared with digital dictation and possible smart phrase technology. Any transcriptional errors that result from this process are unintentional.   Scott Conley BWL:[id number] DOB: Nov 19, 1940 DOA: 11/26/2013 PCP: No primary provider on file.  Brief narrative: 73 y/o ?, no significant pmh admitted 11/26/13 with sudden L sdied Scott Conley, Slurrred speech, accelerated HTN.  He has poor health Scott Holler C. at baseline and has not followed with a doctor for many years  Past medical history-As per Problem list Chart reviewed as below- reviewed  Consultants:  Neurology  Procedures:  CT head  Echocardiogram pending  Carotid Doppler pending  MRI pending  Antibiotics:   None   Subjective  He is a little better. Increasing strength in left upper and lower extremity. Still unable to coordinate well with the left upper extremity. No chest pain no nausea no vomiting slight tolerating diet    Objective    Interim History: No acute findings  Telemetry: Sinus rhythm 90   Objective: Filed Vitals:   11/27/13 0201 11/27/13 0400 11/27/13 0602 11/27/13 0800  BP: 162/81 157/82 169/83 175/83  Pulse: 88 90 95 94  Temp:  97.9 F (36.6 C)    TempSrc:  Oral    Resp: 16 18 18 18   Height:      Weight:   112.492 kg (248 lb)   SpO2: 100% 100% 98% 95%    Intake/Output Summary (Last 24 hours) at 11/27/13 1001 Last data filed at 11/27/13 0800  Gross per 24 hour  Intake    120 ml  Output      0 ml  Net    120 ml    Exam:  General: Alert pleasant oriented, facial twisting to the right, extraocular movements intact, vision by direct confrontation is normal Smile symmetrical Cardiovascular: S1-S2 no murmur rub or gallop no displaced PMI Respiratory: Clear Abdomen: Soft nontender nontender rebound Skin intact Neuro 4/5 power left upper and lower extremity, reflexes are 2/3 bilaterally. Past-pointing noted on the left side with poor coordination movements. Lower  extremity not evaluated from that standpoint. Sensory is intact bilaterally although a little less intense feeling of hypertension and lower extremity on the left side he asked her  Data Reviewed: Basic Metabolic Panel:  Recent Labs Lab 11/26/13 1730 11/26/13 1738 11/26/13 2050  NA 138 137 140  K 5.3 6.6* 3.9  CL 101 106 102  CO2 21  --  22  GLUCOSE 129* 128* 117*  BUN 10 11 9   CREATININE 0.85 1.00 0.89  CALCIUM 9.5  --  9.4   Liver Function Tests:  Recent Labs Lab 11/26/13 1730  AST 25  ALT 18  ALKPHOS 82  BILITOT 0.6  PROT 8.3  ALBUMIN 3.9   No results found for this basename: LIPASE, AMYLASE,  in the last 168 hours No results found for this basename: AMMONIA,  in the last 168 hours CBC:  Recent Labs Lab 11/26/13 1730 11/26/13 1738  WBC 8.6  --   NEUTROABS 6.0  --   HGB 14.5 16.3  HCT 43.7 48.0  MCV 79.5  --   PLT 327  --    Cardiac Enzymes: No results found for this basename: CKTOTAL, CKMB, CKMBINDEX, TROPONINI,  in the last 168 hours BNP: No components found with this basename: POCBNP,  CBG: No results found for this basename: GLUCAP,  in the last 168 hours  No results found for this or any previous visit (from the past 240 hour(s)).   Studies:  All Imaging reviewed and is as per above notation   Scheduled Meds: . aspirin  325 mg Oral Daily  . enoxaparin (LOVENOX) injection  40 mg Subcutaneous Q24H   Continuous Infusions:    Assessment/Plan: 1. Probable right MCA infarct resulting in face hand greater than leg weakness. On stroke pathway-await echocardiogram, MRI, carotid. Neurology has been consulted-recommending aspirin 325 mg daily 2. Accelerated hypertension-allow permissive hypertension up to 220/110-will start antihypertensives in the morning next line 3. Hyperlipidemia-LDL 135. Secondary prevention will start intensity statin atorvastatin 80 mg daily 4. ? Diabetes-blood sugar random 117, a weight HbA1c for further  recommendations  Code Status: Full Family Communication: ? # fr HCPOA-Scott Conley [phone] Disposition Plan: Pending PT/OT Eval   Verneita Griffes, MD  [facility] Pager [phone] 11/27/2013, 10:01 AM    LOS: 1 day

## 2013-11-27 NOTE — Progress Notes (Signed)
  Echocardiogram 2D Echocardiogram has been performed.  [place] 11/27/2013, 2:20 PM

## 2013-11-27 NOTE — Progress Notes (Signed)
Rehab Admissions Coordinator Note:  Patient was screened by Cleatrice Burke for appropriateness for an Inpatient Acute Rehab Consult per PT recommendation.  At this time, we are recommending Inpatient Rehab consult. Please place order.   Audelia [facility] 11/27/2013, 7:47 PM  I can be reached at [phone].

## 2013-11-27 NOTE — Evaluation (Signed)
Physical Therapy Evaluation Patient Details Name: Scott Conley MRN: [id number] DOB: 08-Mar-1941 Today's Date: 11/27/2013   History of Present Illness  Scott Conley is a 73 y.o. male with a history of no new medical problems given that he does not go to physicians frequently. He went to bed yesterday evening (4/30). He woke the next morning with left-sided facial droop and arm weakness.   Clinical Impression  Pt indep and driving PTA. Pt now presents with L sided weakness, impaired balance, and co-ordination. Daughter avail for intermittent supervision but not 24/7. Pt to strongly benefit from CIR upon d/c to achieve safe mod I function for safe transition home.  Pt demo's great rehab potential and suspect can achieve save mod I function thru intense rehab at CIR.    Follow Up Recommendations CIR;Supervision/Assistance - 24 hour    Equipment Recommendations   (defer to next venue)    Recommendations for Other Services Rehab consult     Precautions / Restrictions Precautions Precautions: Fall Restrictions Weight Bearing Restrictions: No      Mobility  Bed Mobility Overal bed mobility: Needs Assistance Bed Mobility: Supine to Sit     Supine to sit: Min assist;HOB elevated     General bed mobility comments: increased time, definite use of UEs  Transfers Overall transfer level: Needs assistance Equipment used: 1 person hand held assist Transfers: Sit to/from Stand Sit to Stand: Min assist         General transfer comment: increased time, guarded, unsteady, limited use of L UE functionally  Ambulation/Gait Ambulation/Gait assistance: Mod assist Ambulation Distance (Feet): 120 Feet Assistive device: Rolling walker (2 wheeled);None Gait Pattern/deviations: Step-through pattern;Decreased step length - left;Decreased stance time - left;Decreased dorsiflexion - left;Trunk flexed;Drifts right/left Gait velocity: slow   General Gait Details: attempted to amb without RW  however pt extremely unsteady requiring modA to maintain balance. pt with increased stability with RW however requirined min/modA for safe walker management has pt had difficulty with holding onto walker with L UE and inabiltiy to stay in walker. Pt required max verbal and tactile cues to manage walker. 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    Modified Rankin (Stroke Patients Only) Modified Rankin (Stroke Patients Only) Pre-Morbid Rankin Score: No symptoms Modified Rankin: Moderately severe disability     Balance Overall balance assessment: Needs assistance Sitting-balance support: No upper extremity supported;Feet supported Sitting balance-Leahy Scale: Fair     Standing balance support: Single extremity supported Standing balance-Leahy Scale: Poor Standing balance comment: pt with anterior bias                             Pertinent Vitals/Pain Denies pain    Home Living Family/patient expects to be discharged to:: Private residence Living Arrangements: Alone Available Help at Discharge: Family;Available PRN/intermittently Type of Home: Apartment Home Access: Level entry     Home Layout: One level Home Equipment: None      Prior Function Level of Independence: Independent               Hand Dominance   Dominant Hand: Right    Extremity/Trunk Assessment   Upper Extremity Assessment: LUE deficits/detail       LUE Deficits / Details: grossly 3/5, L UE drift, grip 3-/5   Lower Extremity Assessment: LLE deficits/detail   LLE Deficits / Details: grossly 3+/5  Cervical /  Trunk Assessment: Normal  Communication   Communication: No difficulties  Cognition Arousal/Alertness: Awake/alert Behavior During Therapy: WFL for tasks assessed/performed Overall Cognitive Status: Within Functional Limits for tasks assessed                      General Comments      Exercises         Assessment/Plan    PT Assessment Patient needs continued PT services  PT Diagnosis Difficulty walking;Generalized weakness   PT Problem List Decreased strength;Decreased activity tolerance;Decreased balance;Decreased mobility  PT Treatment Interventions DME instruction;Gait training;Stair training;Functional mobility training;Therapeutic activities;Therapeutic exercise;Balance training   PT Goals (Current goals can be found in the Care Plan section) Acute Rehab PT Goals Patient Stated Goal: home PT Goal Formulation: With patient Time For Goal Achievement: 12/11/13 Potential to Achieve Goals: Good    Frequency Min 4X/week   Barriers to discharge Decreased caregiver support pt lives alone    Co-evaluation               End of Session Equipment Utilized During Treatment: Gait belt Activity Tolerance: Patient tolerated treatment well Patient left: in chair;with call bell/phone within reach Nurse Communication: Mobility status         Time: 8676-7209 PT Time Calculation (min): 22 min   Charges:   PT Evaluation $Initial PT Evaluation Tier I: 1 Procedure PT Treatments $Gait Training: 8-22 mins   PT G CodesBerline Lopes 11/27/2013, 2:28 PM  Kittie Plater, PT, DPT Pager #: [phone] Office #: [phone]

## 2013-11-28 DIAGNOSIS — I6350 Cerebral infarction due to unspecified occlusion or stenosis of unspecified cerebral artery: Secondary | ICD-10-CM | POA: Diagnosis not present

## 2013-11-28 LAB — COMPREHENSIVE METABOLIC PANEL
ALT: 16 U/L (ref 0–53)
AST: 26 U/L (ref 0–37)
Albumin: 3.6 g/dL (ref 3.5–5.2)
Alkaline Phosphatase: 74 U/L (ref 39–117)
BUN: 14 mg/dL (ref 6–23)
CO2: 20 meq/L (ref 19–32)
CREATININE: 0.87 mg/dL (ref 0.50–1.35)
Calcium: 9.3 mg/dL (ref 8.4–10.5)
Chloride: 100 mEq/L (ref 96–112)
GFR calc Af Amer: >90 mL/min (ref >90)
GFR, EST NON AFRICAN AMERICAN: 83 mL/min — AB (ref >90)
Glucose, Bld: 105 mg/dL — ABNORMAL HIGH (ref 70–99)
Potassium: 3.7 mEq/L (ref 3.7–5.3)
Sodium: 138 mEq/L (ref 137–147)
Total Bilirubin: 0.8 mg/dL (ref 0.3–1.2)
Total Protein: 7.7 g/dL (ref 6.0–8.3)

## 2013-11-28 MED ORDER — SODIUM CHLORIDE 0.9 % IV SOLN
INTRAVENOUS | Status: DC
  Administered 2013-11-29: 20 mL/h via INTRAVENOUS

## 2013-11-28 MED ORDER — CARVEDILOL 6.25 MG PO TABS
6.2500 mg | ORAL_TABLET | Freq: Two times a day (BID) | ORAL | Status: DC
  Administered 2013-11-28 – 2013-11-30 (×3): 6.25 mg via ORAL
  Filled 2013-11-28 (×6): qty 1

## 2013-11-28 MED ORDER — METFORMIN HCL 500 MG PO TABS
500.0000 mg | ORAL_TABLET | Freq: Every day | ORAL | Status: DC
  Administered 2013-11-30: 500 mg via ORAL
  Filled 2013-11-28 (×3): qty 1

## 2013-11-28 NOTE — Progress Notes (Addendum)
Note: This document was prepared with digital dictation and possible smart phrase technology. Any transcriptional errors that result from this process are unintentional.   Scott Conley [id number]:[id number] DOB: 1941/02/11 DOA: 11/26/2013 PCP: No primary provider on file.  Brief narrative: 73 y/o ?, no significant pmh admitted 11/26/13 with sudden L sdied [employer], Slurrred speech, accelerated HTN.  He has poor health literacy of at baseline and has not followed with a doctor for many years  Past medical history-As per Problem list Chart reviewed as below- reviewed  Consultants:  Neurology  Procedures:  CT head  Echocardiogram pending  Carotid Doppler shows 1-39% bilateral stenosis only  MRI  5/3 -shows multifocal areas of acute infarct right hemisphere, thrombosis right middle cerebral artery  Antibiotics:   None   Subjective  He is a little better. Increasing strength in left lower extremity however still has right upper extremity weakness Eating lunch at bedside and watching TV No distress otherwise    Objective    Interim History: No acute findings  Telemetry: Sinus rhythm 90   Objective: Filed Vitals:   11/27/13 2355 11/28/13 0400 11/28/13 0803 11/28/13 1205  BP: 156/92 155/90 155/91 159/80  Pulse: 84 91 90 85  Temp: 97.4 F (36.3 C) 97.5 F (36.4 C) 97.5 F (36.4 C) 97.4 F (36.3 C)  TempSrc: Oral Oral Oral Oral  Resp: 18 18 18 18   Height:      Weight:  108.546 kg (239 lb 4.8 oz)    SpO2: 97% 98% 97% 95%    Intake/Output Summary (Last 24 hours) at 11/28/13 1236 Last data filed at 11/27/13 1300  Gross per 24 hour  Intake    120 ml  Output      0 ml  Net    120 ml    Exam:  General: Alert pleasant oriented, facial twisting to the right, extraocular movements intact, vision by direct confrontation is normal Smile symmetrical Cardiovascular: S1-S2 no murmur rub or gallop no displaced PMI Respiratory: Clear  Data Reviewed: Basic Metabolic  Panel:  Recent Labs Lab 11/26/13 1730 11/26/13 1738 11/26/13 2050 11/28/13 0354  NA 138 137 140 138  K 5.3 6.6* 3.9 3.7  CL 101 106 102 100  CO2 21  --  22 20  GLUCOSE 129* 128* 117* 105*  BUN 10 11 9 14   CREATININE 0.85 1.00 0.89 0.87  CALCIUM 9.5  --  9.4 9.3   Liver Function Tests:  Recent Labs Lab 11/26/13 1730 11/28/13 0354  AST 25 26  ALT 18 16  ALKPHOS 82 74  BILITOT 0.6 0.8  PROT 8.3 7.7  ALBUMIN 3.9 3.6   No results found for this basename: LIPASE, AMYLASE,  in the last 168 hours No results found for this basename: AMMONIA,  in the last 168 hours CBC:  Recent Labs Lab 11/26/13 1730 11/26/13 1738  WBC 8.6  --   NEUTROABS 6.0  --   HGB 14.5 16.3  HCT 43.7 48.0  MCV 79.5  --   PLT 327  --    Cardiac Enzymes: No results found for this basename: CKTOTAL, CKMB, CKMBINDEX, TROPONINI,  in the last 168 hours BNP: No components found with this basename: POCBNP,  CBG: No results found for this basename: GLUCAP,  in the last 168 hours  No results found for this or any previous visit (from the past 240 hour(s)).   Studies:              All Imaging reviewed and  is as per above notation   Scheduled Meds: .  stroke: mapping our early stages of recovery book   Does not apply Once  . aspirin  325 mg Oral Daily  . atorvastatin  80 mg Oral q1800  . enoxaparin (LOVENOX) injection  40 mg Subcutaneous Q24H   Continuous Infusions:    Assessment/Plan: 1. Probable right MCA infarct resulting in face hand greater than leg weakness. On stroke pathway-await echocardiogram, MRI, carotid. Neurology has been consulted-recommending TEE to rule out source of embolus-cardiology has been made aware 2. Accelerated hypertension-patient was allowed to rest hypertension 5/1 now will currently control more aggressively start Coreg 3.125 twice a day 3. Hyperlipidemia-LDL 135. Secondary prevention will start intensity statin atorvastatin 80 mg daily 4. Diabetes, A1c 7.4-started  metformin 500 mg will not aggressively control. Get dietary education from coordinator in the morning  Code Status: Full Family Communication: ? # fr HCPOA-[name],[name] [phone] answer at bedside when I called. Will attempt again in the morning Disposition Plan: Pending PT/OT Eval-speech therapist however recommends skilled nursing care at see her   Verneita Griffes, MD  [facility] Pager [phone] 11/28/2013, 12:36 PM    LOS: 2 days

## 2013-11-28 NOTE — Evaluation (Signed)
Speech Language Pathology Evaluation Patient Details Name: Scott Conley MRN: [id number] DOB: 10-13-1940 Today's Date: 11/28/2013 Time: 3545-6256 SLP Time Calculation (min): 26 min  Problem List:  Patient Active Problem List   Diagnosis Date Noted  . Stroke 11/26/2013   Past Medical History:  Past Medical History  Diagnosis Date  . Blood clot in vein   . Arthritis    Past Surgical History:  Past Surgical History  Procedure Laterality Date  . Cyst removal leg      removed from right groin   HPI:  Pt is 73 yo male who presented to [facility] ED with main concern of sudden onset of left sided weakness and slurred speech and was last seen at his baseline 6 pm night prior to this admission. Pt says he woke up with slurred speech and has noticed weakness was getting worse on the left side.  MRI: Multifocal areas of restricted diffusion affect the right hemisphere consistent with acute infarction. There is no associated hemorrhage.      Assessment / Plan / Recommendation Clinical Impression  Cognitive Linguistic Evaluation completed per Stroke Protocol.  No evidence of aphasia.  Cognitive skills functional at verbal and functional basic levels.  No further Skilled ST indicated in acute care setting as receiving necessary assist and supervision for ADL's.  Recommend ST consult at next level of care to further assess cognition for complex ADL's. ST to sign off education complete.      SLP Assessment  All further Speech Lanaguage Pathology  needs can be addressed in the next venue of care    Follow Up Recommendations  Inpatient Rehab        SLP Evaluation Prior Functioning  Cognitive/Linguistic Baseline: Within functional limits Type of Home: Apartment  Lives With: Alone Available Help at Discharge: Family;Available PRN/intermittently Education: Highschool, 2 years college  Vocation: Retired   Associate Professor  Overall Cognitive Status: Within Functional Limits for tasks  assessed Arousal/Alertness: Awake/alert Orientation Level: Oriented X4 Memory: Appears intact Awareness: Appears intact Problem Solving: Appears intact Safety/Judgment: Appears intact    Comprehension  Auditory Comprehension Overall Auditory Comprehension: Appears within functional limits for tasks assessed Visual Recognition/Discrimination Discrimination: Not tested Reading Comprehension Reading Status: Not tested    Expression Expression Primary Mode of Expression: Verbal Verbal Expression Overall Verbal Expression: Appears within functional limits for tasks assessed   Oral / Motor Oral Motor/Sensory Function Overall Oral Motor/Sensory Function: Appears within functional limits for tasks assessed Motor Speech Overall Motor Speech: Appears within functional limits for tasks assessed   [place], [place] 11/28/2013, 11:56 AM

## 2013-11-28 NOTE — Progress Notes (Signed)
Stroke Team Progress Note  HISTORY Scott Conley is a 73 y.o. male with a history of no new medical problems given that he does not go to physicians frequently. He went to bed yesterday evening (4/30). He woke the next morning with left-sided facial droop and arm weakness. He states that this is been a static deficit since he noticed it.  Patient was not administered TPA secondary to being outside the window. He was admitted to [facility] for further evaluation and treatment.  SUBJECTIVE Resting comfortably, no acute concerns at this time  OBJECTIVE Most recent Vital Signs: Filed Vitals:   11/27/13 1957 11/27/13 2355 11/28/13 0400 11/28/13 0803  BP: 170/86 156/92 155/90 155/91  Pulse: 91 84 91 90  Temp: 97.5 F (36.4 C) 97.4 F (36.3 C) 97.5 F (36.4 C) 97.5 F (36.4 C)  TempSrc: Oral Oral Oral Oral  Resp: 18 18 18 18   Height:      Weight:   239 lb 4.8 oz (108.546 kg)   SpO2: 99% 97% 98% 97%   CBG (last 3)  No results found for this basename: GLUCAP,  in the last 72 hours  IV Fluid Intake:     MEDICATIONS  .  stroke: mapping our early stages of recovery book   Does not apply Once  . aspirin  325 mg Oral Daily  . atorvastatin  80 mg Oral q1800  . enoxaparin (LOVENOX) injection  40 mg Subcutaneous Q24H   PRN:  hydrALAZINE  Diet:  Cardiac  Activity:   Up with assistance DVT Prophylaxis: lovenox  CLINICALLY SIGNIFICANT STUDIES Basic Metabolic Panel:   Recent Labs Lab 11/26/13 2050 11/28/13 0354  NA 140 138  K 3.9 3.7  CL 102 100  CO2 22 20  GLUCOSE 117* 105*  BUN 9 14  CREATININE 0.89 0.87  CALCIUM 9.4 9.3   Liver Function Tests:   Recent Labs Lab 11/26/13 1730 11/28/13 0354  AST 25 26  ALT 18 16  ALKPHOS 82 74  BILITOT 0.6 0.8  PROT 8.3 7.7  ALBUMIN 3.9 3.6   CBC:   Recent Labs Lab 11/26/13 1730 11/26/13 1738  WBC 8.6  --   NEUTROABS 6.0  --   HGB 14.5 16.3  HCT 43.7 48.0  MCV 79.5  --   PLT 327  --    Coagulation:   Recent Labs Lab  11/26/13 1730  LABPROT 13.9  INR 1.09   Cardiac Enzymes: No results found for this basename: CKTOTAL, CKMB, CKMBINDEX, TROPONINI,  in the last 168 hours Urinalysis:   Recent Labs Lab 11/26/13 1757  COLORURINE YELLOW  LABSPEC 1.019  PHURINE 6.0  GLUCOSEU NEGATIVE  HGBUR NEGATIVE  BILIRUBINUR NEGATIVE  KETONESUR NEGATIVE  PROTEINUR NEGATIVE  UROBILINOGEN 1.0  NITRITE NEGATIVE  LEUKOCYTESUR TRACE*   Lipid Panel    Component Value Date/Time   CHOL 196 11/27/2013 0521   TRIG 89 11/27/2013 0521   HDL 43 11/27/2013 0521   CHOLHDL 4.6 11/27/2013 0521   VLDL 18 11/27/2013 0521   LDLCALC 135* 11/27/2013 0521   HgbA1C  Lab Results  Component Value Date   HGBA1C 7.4* 11/27/2013    Urine Drug Screen:     Component Value Date/Time   LABOPIA NONE DETECTED 11/26/2013 1757   COCAINSCRNUR NONE DETECTED 11/26/2013 1757   LABBENZ NONE DETECTED 11/26/2013 1757   AMPHETMU NONE DETECTED 11/26/2013 1757   THCU NONE DETECTED 11/26/2013 1757   LABBARB NONE DETECTED 11/26/2013 1757    Alcohol Level:   Recent  Labs Lab 11/26/13 1730  ETH <11     MRI of the brain   Multifocal areas of restricted diffusion affect the right hemisphere  consistent with acute infarction. There is no associated hemorrhage.  Mild atrophy and small vessel disease.  Suspected acute thrombosis of one or more right middle cerebral  artery M3 vessels. Intracranial atherosclerotic change is seen more  proximally in a right MCA M2 branch.  2D Echocardiogram    Carotid Doppler    CXR    EKG    Therapy Recommendations   Physical Exam   Mental Status:  Patient is awake, alert, oriented to person, place, month, year, and situation.  Immediate and remote memory are intact.  Patient is able to give a clear and coherent history.  No signs of aphasia or neglect  Cranial Nerves:  II: Visual Fields are full. Pupils are equal, round, and reactive to light.  III,IV, VI: EOMI without ptosis or diploplia.  V: Facial sensation is  symmetric to temperature  VII: Facial movement is notable for droop on left  VIII: hearing is intact to voice  X: Uvula elevates symmetrically  XI: Shoulder shrug is symmetric.  XII: tongue is midline without atrophy or fasciculations.  Motor:  Tone is normal. Bulk is normal. 5/5 strength was present on the right side, on the left he has a distal much greater than proximal weakness of the left upper extremity and possible very mild weakness of the left lower extremity(5-/5).  Sensory:  Sensation is symmetric to light touch and temperature in the arms and legs.  Deep Tendon Reflexes:  2+ and symmetric in the biceps and patellae.  Plantars:  Toes are downgoing bilaterally.    ASSESSMENT [name] is a 73 y.o. male presenting with left sided facial droop and LUE weakness. No TPA given as he is outside the window. CT head showed possible subacute infarct. On no anticoagulation prior to admission. Now on ASA 325mg  daily for secondary stroke prevention. Patient with resultant left sided weakness.MRI shows right hemispheric acute infarct, suspect embolic etiology. Stroke work up underway.   CVA  LDL 135  DM    Hospital day # 2  TREATMENT/PLAN  Continue ASA 325mg  daily  Lipitor 80mg  daily  Carotid doppler  Suggest TEE, cardiology notified, may need loop recorder  Rehab consult  Risk factor management   Jim Like, DO Neurology-Stroke    To contact Stroke Continuity provider, please refer to [email or web address] After hours, contact General Neurology

## 2013-11-28 NOTE — Progress Notes (Signed)
VASCULAR LAB PRELIMINARY  PRELIMINARY  PRELIMINARY  PRELIMINARY  Carotid duplex completed.    Preliminary report:  Bilateral:  1-39% ICA stenosis.  Vertebral artery flow is antegrade.     [place], [place] 11/28/2013, 11:29 AM

## 2013-11-29 ENCOUNTER — Encounter (HOSPITAL_COMMUNITY): Payer: Self-pay | Admitting: *Deleted

## 2013-11-29 ENCOUNTER — Encounter (HOSPITAL_COMMUNITY)
Admission: EM | Disposition: A | Discharge status: Rehabilitation Facility | Payer: Self-pay | Source: Home / Self Care | Attending: Family Medicine

## 2013-11-29 DIAGNOSIS — I6330 Cerebral infarction due to thrombosis of unspecified cerebral artery: Secondary | ICD-10-CM | POA: Diagnosis not present

## 2013-11-29 DIAGNOSIS — I6350 Cerebral infarction due to unspecified occlusion or stenosis of unspecified cerebral artery: Secondary | ICD-10-CM

## 2013-11-29 DIAGNOSIS — G8110 Spastic hemiplegia affecting unspecified side: Secondary | ICD-10-CM | POA: Diagnosis not present

## 2013-11-29 HISTORY — PX: LOOP RECORDER IMPLANT: SHX5477

## 2013-11-29 HISTORY — PX: LOOP RECORDER IMPLANT: SHX5954

## 2013-11-29 HISTORY — PX: TEE WITHOUT CARDIOVERSION: SHX5443

## 2013-11-29 LAB — I-STAT CHEM 8, ED
BUN: 11 mg/dL (ref 6–23)
CREATININE: 1 mg/dL (ref 0.50–1.35)
Calcium, Ion: 1.02 mmol/L — ABNORMAL LOW (ref 1.13–1.30)
Chloride: 106 mEq/L (ref 96–112)
Glucose, Bld: 128 mg/dL — ABNORMAL HIGH (ref 70–99)
HCT: 48 % (ref 39.0–52.0)
Hemoglobin: 16.3 g/dL (ref 13.0–17.0)
POTASSIUM: 6.6 meq/L — AB (ref 3.7–5.3)
Sodium: 137 mEq/L (ref 137–147)
TCO2: 23 mmol/L (ref 0–100)

## 2013-11-29 SURGERY — LOOP RECORDER IMPLANT
Anesthesia: LOCAL

## 2013-11-29 SURGERY — ECHOCARDIOGRAM, TRANSESOPHAGEAL
Anesthesia: Moderate Sedation

## 2013-11-29 MED ORDER — LIVING WELL WITH DIABETES BOOK
Freq: Once | Status: AC
  Administered 2013-11-29: 09:00:00
  Filled 2013-11-29 (×2): qty 1

## 2013-11-29 MED ORDER — ENOXAPARIN SODIUM 60 MG/0.6ML ~~LOC~~ SOLN
55.0000 mg | SUBCUTANEOUS | Status: DC
  Administered 2013-11-30: 55 mg via SUBCUTANEOUS
  Filled 2013-11-29 (×2): qty 0.6

## 2013-11-29 MED ORDER — METOPROLOL TARTRATE 1 MG/ML IV SOLN
INTRAVENOUS | Status: AC
  Filled 2013-11-29: qty 5

## 2013-11-29 MED ORDER — METOPROLOL TARTRATE 1 MG/ML IV SOLN
2.5000 mg | Freq: Once | INTRAVENOUS | Status: AC
  Administered 2013-11-29: 2.5 mg via INTRAVENOUS

## 2013-11-29 MED ORDER — MIDAZOLAM HCL 10 MG/2ML IJ SOLN
INTRAMUSCULAR | Status: DC | PRN
  Administered 2013-11-29: 2 mg via INTRAVENOUS

## 2013-11-29 MED ORDER — LIDOCAINE VISCOUS 2 % MT SOLN
OROMUCOSAL | Status: DC | PRN
  Administered 2013-11-29: 5 mL via OROMUCOSAL

## 2013-11-29 MED ORDER — FENTANYL CITRATE 0.05 MG/ML IJ SOLN
INTRAMUSCULAR | Status: AC
  Filled 2013-11-29: qty 2

## 2013-11-29 MED ORDER — BUTAMBEN-TETRACAINE-BENZOCAINE 2-2-14 % EX AERO
INHALATION_SPRAY | CUTANEOUS | Status: DC | PRN
  Administered 2013-11-29: 2 via TOPICAL

## 2013-11-29 MED ORDER — FENTANYL CITRATE 0.05 MG/ML IJ SOLN
INTRAMUSCULAR | Status: DC | PRN
  Administered 2013-11-29 (×2): 25 ug via INTRAVENOUS

## 2013-11-29 MED ORDER — LIDOCAINE VISCOUS 2 % MT SOLN
OROMUCOSAL | Status: AC
  Filled 2013-11-29: qty 15

## 2013-11-29 MED ORDER — MIDAZOLAM HCL 5 MG/ML IJ SOLN
INTRAMUSCULAR | Status: AC
  Filled 2013-11-29: qty 2

## 2013-11-29 NOTE — Plan of Care (Signed)
Problem: Food- and Nutrition-Related Knowledge Deficit (NB-1.1) Goal: Nutrition education Formal process to instruct or train a patient/client in a skill or to impart knowledge to help patients/clients voluntarily manage or modify food choices and eating behavior to maintain or improve health.  Outcome: Completed/Met Date Met:  11/29/13  RD consulted for nutrition education regarding diabetes.     Lab Results  Component Value Date    HGBA1C 7.4* 11/27/2013    RD provided "Carbohydrate Counting for People with Diabetes" handout from the Academy of Nutrition and Dietetics. Discussed different food groups and their effects on blood sugar, emphasizing carbohydrate-containing foods. Provided list of carbohydrates and recommended serving sizes of common foods.  Discussed importance of controlled and consistent carbohydrate intake throughout the day. Provided examples of ways to balance meals/snacks and encouraged intake of high-fiber, whole grain complex carbohydrates. Teach back method used.  Expect fair compliance.  Body mass index is 29.38 kg/(m^2). Pt meets criteria for overweight based on current BMI.  Current diet order is NPO for a procedure. Labs and medications reviewed. No further nutrition interventions warranted at this time. RD contact information provided. If additional nutrition issues arise, please re-consult RD.  Molli Barrows, RD, LDN, [place] Pager [phone] After Hours Pager [phone]

## 2013-11-29 NOTE — Progress Notes (Signed)
Utilization review completed.  

## 2013-11-29 NOTE — Clinical Documentation Improvement (Signed)
Possible Clinical Conditions? ____________________Hemiparesis  ____________________Hemiplegia Clarify if dominant  Vs  non-dominant side _______Other Condition__________________ _______Cannot Clinically Determine   Supporting Information: Risk Factors: Stroke  Signs & Symptoms: ED notes: "chief complaint of left-sided weakness and slurred speech.  Neurological: Positive for facial asymmetry and weakness  Musculoskeletal: 4/5 ROM and strength in left upper and lower extremities  Neurological: No sensation in left upper and lower extremities" H&P: "main concern of sudden onset of left sided weakness; noticed weakness was getting worse on the left side Diagnostics: Treatment: MRI/MRA,  Neuro consult,  PT eval  Thank You, Philippa Chester ,RN Clinical Documentation Specialist:  [place] Information Management

## 2013-11-29 NOTE — CV Procedure (Signed)
Pre op Dx cryptogenic stroke Post op Dx    Procedure  Loop Recorder implantation  After routine prep and drape of the left parasternal area, a small incision was created. A [employer] LINQ Reveal Loop Recorder  Serial Number  [id number] was inserted.    Steristrips were  applied.  The patient tolerated the procedure without apparent complication.

## 2013-11-29 NOTE — Progress Notes (Signed)
I met with pt at bedside and contacted his daughter, [name], by phone per his request. Pt is in agreement to an inpt rehab admission today after his TEE. I contacted Dr. Samtani and he is aware and in agreement. I will make arrangements for admit later today. [phone] 

## 2013-11-29 NOTE — Progress Notes (Signed)
PT Progress Note:   Pt progressing with mobility & PT goals at this date.  Required decreased assistance for all mobility but cont's to present with decreased strength, balance, & independence with functional mobility.  Cont with current POC & d/c recommendation of CIR.      11/29/13 1200  PT Visit Information  Last PT Received On 11/29/13  Assistance Needed +1  History of Present Illness Scott Conley is a 73 y.o. male with a history of no new medical problems given that he does not go to physicians frequently. He went to bed yesterday evening (4/30). He woke the next morning with left-sided facial droop and arm weakness.   PT Time Calculation  PT Start Time 0808  PT Stop Time 0831  PT Time Calculation (min) 23 min  Subjective Data  Patient Stated Goal home  Precautions  Precautions Fall  Restrictions  Weight Bearing Restrictions No  Cognition  Arousal/Alertness Awake/alert  Behavior During Therapy WFL for tasks assessed/performed  Overall Cognitive Status Within Functional Limits for tasks assessed  Bed Mobility  Overal bed mobility Needs Assistance  Bed Mobility Supine to Sit  Supine to sit Min guard;HOB elevated  General bed mobility comments increased time, definite use of UEs  Transfers  Overall transfer level Needs assistance  Transfers Sit to/from Stand  Sit to Stand Min guard  General transfer comment cues for hand placement & technique.  Performed 5x's for strengthening, technique, & activiy tolerance.    Ambulation/Gait  Ambulation/Gait assistance Min guard;Min assist  Ambulation Distance (Feet) 150 Feet  Assistive device Rolling walker (2 wheeled);None  Gait Pattern/deviations Step-through pattern;Decreased stride length  Gait velocity slow  General Gait Details ocassional min (A) with turns due to mild unsteadiness.  Cues for safe RW management & body positioning inside RW.    Balance  Standing balance support No upper extremity supported  Standing  balance-Leahy Scale Good  Standing balance comment Pt able to reach cross midline, weight shift, reach various heights without UE support  PT - End of Session  Equipment Utilized During Treatment Gait belt  Activity Tolerance Patient tolerated treatment well  Patient left in chair;with call bell/phone within reach  Nurse Communication Mobility status  PT - Assessment/Plan  PT Plan Current plan remains appropriate  PT Frequency Min 4X/week  Recommendations for Other Services Rehab consult  Follow Up Recommendations CIR;Supervision/Assistance - 24 hour  Acute Rehab PT Goals  PT Goal Formulation With patient  Time For Goal Achievement 12/11/13  Potential to Achieve Goals Good  PT General Charges  $$ ACUTE PT VISIT 1 Procedure  PT Treatments  $Gait Training 8-22 mins  $Therapeutic Activity 8-22 mins    Sarajane Marek, [place] [phone] 11/29/2013

## 2013-11-29 NOTE — Consult Note (Signed)
ELECTROPHYSIOLOGY CONSULT NOTE  Patient ID: Scott Conley MRN: [id number], DOB/AGE: 1940-09-01   Admit date: 11/26/2013 Date of Consult: 11/29/2013  Primary Physician: No primary provider on file. Primary Cardiologist: new to [facility] Reason for Consultation: Cryptogenic stroke; recommendations regarding Implantable Loop Recorder  History of Present Illness Scott Conley was admitted on 11/26/2013 with left sided weakness and slurred speech.  Imaging has confirmed right hemispheric acute infarct. He has been monitored on telemetry which has demonstrated no arrhythmias. No cause has been identified. Inpatient stroke work-up is to be completed with a TEE. EP has been asked to evaluate for placement of an implantable loop recorder to monitor for atrial fibrillation.  TEE>> neg for CSE Normal function and structure  Past Medical History Past Medical History  Diagnosis Date  . Blood clot in vein   . Arthritis   . Diabetes   . CVA (cerebral infarction)   . Hypertension     Past Surgical History Past Surgical History  Procedure Laterality Date  . Cyst removal leg      removed from right groin    Allergies/Intolerances No Known Allergies Inpatient Medications . aspirin  325 mg Oral Daily  . atorvastatin  80 mg Oral q1800  . carvedilol  6.25 mg Oral BID WC  . enoxaparin (LOVENOX) injection  55 mg Subcutaneous Q24H  . living well with diabetes book   Does not apply Once  . metFORMIN  500 mg Oral Q breakfast   . sodium chloride     Social History History   Social History  . Marital Status: Widowed    Spouse Name: N/A    Number of Children: N/A  . Years of Education: N/A   Occupational History  . Not on file.   Social History Main Topics  . Smoking status: Heavy Tobacco Smoker -- 0.50 packs/day for 58 years    Types: Cigarettes  . Smokeless tobacco: Never Used  . Alcohol Use: No     Comment: none since 1985  . Drug Use: No  . Sexual Activity: Not on file    Other Topics Concern  . Not on file   Social History Narrative  . No narrative on file    Review of Systems General: No chills, fever, night sweats or weight changes  Cardiovascular:  No chest pain, dyspnea on exertion, edema, orthopnea, palpitations, paroxysmal nocturnal dyspnea Dermatological: No rash, lesions or masses Respiratory: No cough, dyspnea Urologic: No hematuria, dysuria Abdominal: No nausea, vomiting, diarrhea, bright red blood per rectum, melena, or hematemesis Neurologic: No visual changes, weakness, changes in mental status All other systems reviewed and are otherwise negative except as noted above.  Physical Exam Blood pressure 140/73, pulse 83, temperature 97.4 F (36.3 C), temperature source Oral, resp. rate 18, height 6\' 4"  (1.93 m), weight 241 lb 4.8 oz (109.453 kg), SpO2 99.00%.  General: Well developed, well appearing 73 y.o. male in no acute distress. HEENT: Normocephalic, atraumatic. EOMs intact. Sclera nonicteric.    Neck: Supple  Lungs: Respirations regular and unlabored, CTA bilaterally. No wheezes, rales or rhonchi. Heart: RRR. S1, S2 present. No murmurs,  Abdomen: Soft, non-tender, non-distended. BS present x 4 quadrants. No hepatosplenomegaly.  Extremities: No clubbing, cyanosis or edema. Psych: Normal affect. Neuro: Alert and oriented X 3. Moves all extremities spontaneously.  L sided weakness Musculoskeletal: No kyphosis. Skin: Intact. Warm and dry. No rashes or petechiae in exposed areas.   Labs Lab Results  Component Value Date   WBC 8.6  11/26/2013   HGB 16.3 11/26/2013   HCT 48.0 11/26/2013   MCV 79.5 11/26/2013   PLT 327 11/26/2013     Recent Labs Lab 11/28/13 0354  NA 138  K 3.7  CL 100  CO2 20  BUN 14  CREATININE 0.87  CALCIUM 9.3  PROT 7.7  BILITOT 0.8  ALKPHOS 74  ALT 16  AST 26  GLUCOSE 105*    Recent Labs  11/26/13 1730  INR 1.09    Radiology/Studies Dg Chest 2 View 11/27/2013   CLINICAL DATA:  Left-sided weakness,  stroke  EXAM: CHEST  2 VIEW  COMPARISON:  None.  FINDINGS: Lungs are clear.  No pleural effusion or pneumothorax.  The heart is normal in size.  Mild degenerative changes of the visualized thoracolumbar spine.  IMPRESSION: No evidence of acute cardiopulmonary disease.   Electronically Signed   By: Julian Hy M.D.   On: 11/27/2013 19:14   Ct Head Wo Contrast 11/26/2013   CLINICAL DATA:  73 year old with left-sided arm and leg weakness for 1 day.  EXAM: CT HEAD WITHOUT CONTRAST  TECHNIQUE: Contiguous axial images were obtained from the base of the skull through the vertex without intravenous contrast.  COMPARISON:  None.  FINDINGS: There is a small high right frontoparietal cortical infarct which is age-indeterminate.  Mild chronic small-vessel white matter ischemic changes are present.  There is no evidence of mass lesion or mass effect, hydrocephalus, extra-axial fluid collection, midline shift or hemorrhage.  The visualized bony calvarium is unremarkable.  IMPRESSION: Small age-indeterminate high right frontoparietal cortical infarct - no evidence of hemorrhage.  Chronic small-vessel white matter ischemic changes.   Electronically Signed   By: Hassan Rowan M.D.   On: 11/26/2013 18:45   Mr Brain Wo Contrast 11/27/2013   CLINICAL DATA:  Patient awoke with left-sided facial droop and arm weakness.  EXAM: MRI HEAD WITHOUT CONTRAST  MRA HEAD WITHOUT CONTRAST  TECHNIQUE: Multiplanar, multiecho pulse sequences of the brain and surrounding structures were obtained without intravenous contrast. Angiographic images of the head were obtained using MRA technique without contrast.  COMPARISON:  CT HEAD W/O CM dated 11/26/2013  FINDINGS: MRI HEAD FINDINGS  Multifocal areas of restricted diffusion affect the right posterior frontal, anterior parietal, and superior temporal lobe largely punctate and subcentimeter but some confluent consistent with an acute right MCA territory insult. There is no hemorrhage, mass lesion,  hydrocephalus, or extra-axial fluid.  Mild cerebral and cerebellar atrophy. Mild subcortical and periventricular T2 and FLAIR hyperintensities, likely chronic microvascular ischemic change. Flow voids are maintained throughout the carotid, basilar, and vertebral arteries. There are no areas of chronic hemorrhage. Pituitary, pineal, and cerebellar tonsils unremarkable. No upper cervical lesions. Visualized calvarium, skull base, and upper cervical osseous structures unremarkable. Scalp and extracranial soft tissues, orbits, sinuses, and mastoids show no acute process.  MRA HEAD FINDINGS  Mild non stenotic irregularity of the cavernous and supra clinoid internal carotid arteries without flow-limiting stenosis. Basilar artery widely patent. Right vertebral dominant. Left vertebral diminutive, primarily supplying PICA. No proximal flow limiting stenosis of the anterior, middle, or posterior cerebral arteries. No cerebellar branch occlusion. No visible aneurysm.  There are a reduced number of right middle cerebral artery M3 vessels consistent with one or more MCA branch occlusions. There is a severe stenosis of a proximal M2 branch on the right; it is unclear if the right MCA is a trifurcation or bifurcation.  IMPRESSION: Multifocal areas of restricted diffusion affect the right hemisphere consistent with acute infarction.  There is no associated hemorrhage.  Mild atrophy and small vessel disease.  Suspected acute thrombosis of one or more right middle cerebral artery M3 vessels. Intracranial atherosclerotic change is seen more proximally in a right MCA M2 branch.   Electronically Signed   By: Rolla Flatten M.D.   On: 11/27/2013 21:27   Echocardiogram  11-27-13 EF 55-60%, no RWMA, trivial AR, RA mildly dilated, LA 33.   12-lead ECG sinus rhythm, rate 68, LVH, prolonged QT Telemetry *    Assessment and Plan 1. Cryptogenic stroke   If the TEE is negative, we recommend loop recorder insertion to monitor for AF. The  indication for loop recorder insertion / monitoring for AF in setting of cryptogenic stroke was discussed with the patient. The loop recorder insertion procedure was reviewed in detail including risks and benefits. These risks include but are not limited to bleeding and infection. The patient expressed verbal understanding and agrees to proceed. The patient was also counseled regarding wound care and device follow-up.  Signed,

## 2013-11-29 NOTE — Consult Note (Signed)
Physical Medicine and Rehabilitation Consult Reason for Consult: CVA Referring Physician: [facility]   HPI: Scott Conley is a 73 y.o. right-handed male on no scheduled medications. Presented 11/26/2013 with left-sided weakness and slurred speech. Noted elevated systolic blood pressure in the 200s as well as findings of potassium 6.6. MRI of the brain shows right hemispheric acute infarct. MRA of the head with atherosclerotic type changes. Echocardiogram with ejection fraction of 60% no wall motion abnormalities. Carotid Dopplers with no ICA stenosis. Patient did not receive TPA. Coreg was added for hypertension control. TEE is pending. Neurology followup maintain on aspirin for CVA prophylaxis as well as subcutaneous Lovenox for DVT prophylaxis. Elevated hemoglobin A1c of 7.4 Glucophage initiated. Physical therapy evaluation completed 11/27/2013 with recommendations of physical medicine rehabilitation consult  Patient is retired he lives alone but has a daughter that can help him post discharge. He was independent prior to admission  Review of Systems  Musculoskeletal: Positive for joint pain and myalgias.  Neurological: Positive for speech change.  All other systems reviewed and are negative.  Past Medical History  Diagnosis Date  . Blood clot in vein   . Arthritis    Past Surgical History  Procedure Laterality Date  . Cyst removal leg      removed from right groin   History reviewed. No pertinent family history. Social History:  reports that he has been smoking Cigarettes.  He has a 29 pack-year smoking history. He has never used smokeless tobacco. He reports that he does not drink alcohol or use illicit drugs. Allergies: No Known Allergies Medications Prior to Admission  Medication Sig Dispense Refill  . diphenhydrAMINE (BENADRYL) 25 mg capsule Take 25 mg by mouth at bedtime as needed for sleep.        Home: Home Living Family/patient expects to be discharged to::  Private residence Living Arrangements: Alone Available Help at Discharge: Family;Available PRN/intermittently Type of Home: Apartment Home Access: Level entry Home Layout: One level Home Equipment: None  Lives With: Alone  Functional History: Prior Function Level of Independence: Independent Functional Status:  Mobility: Bed Mobility Overal bed mobility: Needs Assistance Bed Mobility: Supine to Sit Supine to sit: Min assist;HOB elevated General bed mobility comments: increased time, definite use of UEs Transfers Overall transfer level: Needs assistance Equipment used: 1 person hand held assist Transfers: Sit to/from Stand Sit to Stand: Min assist General transfer comment: increased time, guarded, unsteady, limited use of L UE functionally Ambulation/Gait Ambulation/Gait assistance: Mod assist Ambulation Distance (Feet): 120 Feet Assistive device: Rolling walker (2 wheeled);None Gait Pattern/deviations: Step-through pattern;Decreased step length - left;Decreased stance time - left;Decreased dorsiflexion - left;Trunk flexed;Drifts right/left Gait velocity: slow General Gait Details: attempted to amb without RW however pt extremely unsteady requiring modA to maintain balance. pt with increased stability with RW however requirined min/modA for safe walker management has pt had difficulty with holding onto walker with L UE and inabiltiy to stay in walker. Pt required max verbal and tactile cues to manage walker. requiring max walker management during turns    ADL:    Cognition: Cognition Overall Cognitive Status: Within Functional Limits for tasks assessed Arousal/Alertness: Awake/alert Orientation Level: Oriented X4 Memory: Appears intact Awareness: Appears intact Problem Solving: Appears intact Safety/Judgment: Appears intact Cognition Arousal/Alertness: Awake/alert Behavior During Therapy: WFL for tasks assessed/performed Overall Cognitive Status: Within Functional  Limits for tasks assessed  Blood pressure 146/78, pulse 84, temperature 97.6 F (36.4 C), temperature source Oral, resp. rate 18, height 6'  4" (1.93 m), weight 109.453 kg (241 lb 4.8 oz), SpO2 95.00%. Physical Exam  Constitutional: He is oriented to person, place, and time. He appears well-developed.  HENT:  Head: Normocephalic.  Eyes: EOM are normal.  Neck: Normal range of motion. Neck supple. No thyromegaly present.  Cardiovascular: Normal rate and regular rhythm.   Respiratory: Effort normal and breath sounds normal. No respiratory distress.  GI: Soft. Bowel sounds are normal. He exhibits no distension.  Neurological: He is alert and oriented to person, place, and time.  Follows three-step commands  Skin: Skin is warm and dry.  Psychiatric: He has a normal mood and affect.   motor strength is 5/5 in the right deltoid, bicep, tricep, grip, hip flexor, knee extensor, and ankle dorsiflexor plantar flexor 3/5 in the left deltoid, bicep, tricep, grip, 4/5 in the left hip flexor, knee extensors, ankle dorsiflexion plantar flexor Absent sensation left hand, normal sensation right hand Reduced sensation left lateral foot, normal sensation right foot  No results found for this or any previous visit (from the past 24 hour(s)). Dg Chest 2 View  11/27/2013   CLINICAL DATA:  Left-sided weakness, stroke  EXAM: CHEST  2 VIEW  COMPARISON:  None.  FINDINGS: Lungs are clear.  No pleural effusion or pneumothorax.  The heart is normal in size.  Mild degenerative changes of the visualized thoracolumbar spine.  IMPRESSION: No evidence of acute cardiopulmonary disease.   Electronically Signed   By: Julian Hy M.D.   On: 11/27/2013 19:14   Mr Brain Wo Contrast  11/27/2013   CLINICAL DATA:  Patient awoke with left-sided facial droop and arm weakness.  EXAM: MRI HEAD WITHOUT CONTRAST  MRA HEAD WITHOUT CONTRAST  TECHNIQUE: Multiplanar, multiecho pulse sequences of the brain and surrounding structures were  obtained without intravenous contrast. Angiographic images of the head were obtained using MRA technique without contrast.  COMPARISON:  CT HEAD W/O CM dated 11/26/2013  FINDINGS: MRI HEAD FINDINGS  Multifocal areas of restricted diffusion affect the right posterior frontal, anterior parietal, and superior temporal lobe largely punctate and subcentimeter but some confluent consistent with an acute right MCA territory insult. There is no hemorrhage, mass lesion, hydrocephalus, or extra-axial fluid.  Mild cerebral and cerebellar atrophy. Mild subcortical and periventricular T2 and FLAIR hyperintensities, likely chronic microvascular ischemic change. Flow voids are maintained throughout the carotid, basilar, and vertebral arteries. There are no areas of chronic hemorrhage. Pituitary, pineal, and cerebellar tonsils unremarkable. No upper cervical lesions. Visualized calvarium, skull base, and upper cervical osseous structures unremarkable. Scalp and extracranial soft tissues, orbits, sinuses, and mastoids show no acute process.  MRA HEAD FINDINGS  Mild non stenotic irregularity of the cavernous and supra clinoid internal carotid arteries without flow-limiting stenosis. Basilar artery widely patent. Right vertebral dominant. Left vertebral diminutive, primarily supplying PICA. No proximal flow limiting stenosis of the anterior, middle, or posterior cerebral arteries. No cerebellar branch occlusion. No visible aneurysm.  There are a reduced number of right middle cerebral artery M3 vessels consistent with one or more MCA branch occlusions. There is a severe stenosis of a proximal M2 branch on the right; it is unclear if the right MCA is a trifurcation or bifurcation.  IMPRESSION: Multifocal areas of restricted diffusion affect the right hemisphere consistent with acute infarction. There is no associated hemorrhage.  Mild atrophy and small vessel disease.  Suspected acute thrombosis of one or more right middle cerebral  artery M3 vessels. Intracranial atherosclerotic change is seen more proximally in a right  MCA M2 branch.   Electronically Signed   By: Rolla Flatten M.D.   On: 11/27/2013 21:27   Mr Jodene Nam Head/brain Wo Cm  11/27/2013   CLINICAL DATA:  Patient awoke with left-sided facial droop and arm weakness.  EXAM: MRI HEAD WITHOUT CONTRAST  MRA HEAD WITHOUT CONTRAST  TECHNIQUE: Multiplanar, multiecho pulse sequences of the brain and surrounding structures were obtained without intravenous contrast. Angiographic images of the head were obtained using MRA technique without contrast.  COMPARISON:  CT HEAD W/O CM dated 11/26/2013  FINDINGS: MRI HEAD FINDINGS  Multifocal areas of restricted diffusion affect the right posterior frontal, anterior parietal, and superior temporal lobe largely punctate and subcentimeter but some confluent consistent with an acute right MCA territory insult. There is no hemorrhage, mass lesion, hydrocephalus, or extra-axial fluid.  Mild cerebral and cerebellar atrophy. Mild subcortical and periventricular T2 and FLAIR hyperintensities, likely chronic microvascular ischemic change. Flow voids are maintained throughout the carotid, basilar, and vertebral arteries. There are no areas of chronic hemorrhage. Pituitary, pineal, and cerebellar tonsils unremarkable. No upper cervical lesions. Visualized calvarium, skull base, and upper cervical osseous structures unremarkable. Scalp and extracranial soft tissues, orbits, sinuses, and mastoids show no acute process.  MRA HEAD FINDINGS  Mild non stenotic irregularity of the cavernous and supra clinoid internal carotid arteries without flow-limiting stenosis. Basilar artery widely patent. Right vertebral dominant. Left vertebral diminutive, primarily supplying PICA. No proximal flow limiting stenosis of the anterior, middle, or posterior cerebral arteries. No cerebellar branch occlusion. No visible aneurysm.  There are a reduced number of right middle cerebral artery M3  vessels consistent with one or more MCA branch occlusions. There is a severe stenosis of a proximal M2 branch on the right; it is unclear if the right MCA is a trifurcation or bifurcation.  IMPRESSION: Multifocal areas of restricted diffusion affect the right hemisphere consistent with acute infarction. There is no associated hemorrhage.  Mild atrophy and small vessel disease.  Suspected acute thrombosis of one or more right middle cerebral artery M3 vessels. Intracranial atherosclerotic change is seen more proximally in a right MCA M2 branch.   Electronically Signed   By: Rolla Flatten M.D.   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 Does the need for close, 24 hr/day medical supervision in concert with the patient's rehab needs make it unreasonable for this patient to be served in a less intensive setting? Yes 2. Co-Morbidities requiring supervision/potential complications: Diabetes, osteoarthritis, tobacco abuse 3. Due to bladder management, bowel management, safety, skin/wound care, disease management, medication administration, pain management and patient education, does the patient require 24 hr/day rehab nursing? Yes 4. Does the patient require coordinated care of a physician, rehab nurse, PT (1-2 hrs/day, 5 days/week) and OT (1-2 hrs/day, 5 days/week) to address physical and functional deficits in the context of the above medical diagnosis(es)? Yes Addressing deficits in the following areas: balance, endurance, locomotion, strength, transferring, bowel/bladder control, bathing, dressing, feeding, grooming and toileting 5. Can the patient actively participate in an intensive therapy program of at least 3 hrs of therapy per day at least 5 days per week? Yes 6. The potential for patient to make measurable gains while on inpatient rehab is good 7. Anticipated functional outcomes upon discharge from inpatient rehab are modified  independent  with PT, modified independent with OT, modified independent with SLP. 8. Estimated rehab length of stay to reach the above functional goals is: 10-12 days 9. Does  the patient have adequate social supports to accommodate these discharge functional goals? Yes 10. Anticipated D/C setting: Home 11. Anticipated post D/C treatments: [place] therapy 12. Overall Rehab/Functional Prognosis: excellent  RECOMMENDATIONS: This patient's condition is appropriate for continued rehabilitative care in the following setting: CIR Patient has agreed to participate in recommended program. Yes Note that insurance prior authorization may be required for reimbursement for recommended care.  Comment:     11/29/2013

## 2013-11-29 NOTE — Progress Notes (Signed)
Made aware that loop recorder unable to be accomplished today. I discussed with Dr. Verlon Au and we will hold admission for today. I will follow up tomorrow. [phone]

## 2013-11-29 NOTE — Interval H&P Note (Signed)
History and Physical Interval Note:  11/29/2013 12:40 PM  Scott Conley  has presented today for surgery, with the diagnosis of stroke  The various methods of treatment have been discussed with the patient and family. After consideration of risks, benefits and other options for treatment, the patient has consented to  Procedure(s): TRANSESOPHAGEAL ECHOCARDIOGRAM (TEE) (N/A) as a surgical intervention .  The patient's history has been reviewed, patient examined, no change in status, stable for surgery.  I have reviewed the patient's chart and labs.  Questions were answered to the patient's satisfaction.     Sueanne Margarita

## 2013-11-29 NOTE — Evaluation (Signed)
Occupational Therapy Evaluation Patient Details Name: Scott Conley MRN: [id number] DOB: [birth year] Today's Date: 11/29/2013    History of Present Illness Scott Conley is a 73 y.o. male with a history of no new medical problems given that he does not go to physicians frequently. He went to 4/30 and woke the next morning with left-sided facial droop and arm weakness.    Clinical Impression   Patient very engaging.  Excited for his transfer to CIR.  He understands that he is waiting for placement of loop recorder.  Patient with mostly sensory-motor restrictions after this stroke.  Non dominant hemiparesis with decreased ability to sustain muscle activation, and difficulty with interlimb coordination.  Patient with LLE weakness which impedes dynamic stand balance at this time.  Patient also limited in his ability to reach his feet in sitting.      Follow Up Recommendations  CIR    Equipment Recommendations  Other (comment) (to be determined by CIR)    Recommendations for Other Services Rehab consult     Precautions / Restrictions Precautions Precautions: Fall Restrictions Weight Bearing Restrictions: No      Mobility Bed Mobility Overal bed mobility: Needs Assistance Bed Mobility: Supine to Sit     Supine to sit: Min guard;HOB elevated     General bed mobility comments: increased time, definite use of UEs  Transfers Overall transfer level: Needs assistance Equipment used: 1 person hand held assist Transfers: Sit to/from Stand Sit to Stand: Min guard         General transfer comment: cues for hand placement & technique.  Performed 5x's for strengthening, technique, & activiy tolerance.      Balance Overall balance assessment: Needs assistance Sitting-balance support: No upper extremity supported;Feet supported Sitting balance-Leahy Scale: Fair   Postural control: Posterior lean Standing balance support: No upper extremity supported;During functional  activity Standing balance-Leahy Scale: Fair Standing balance comment: Pt able to reach cross midline, weight shift, reach various heights without UE support                            ADL Overall ADL's : Needs assistance/impaired     Grooming: Wash/dry hands;Wash/dry face   Upper Body Bathing: Minimal assitance   Lower Body Bathing: Moderate assistance   Upper Body Dressing : Minimal assistance   Lower Body Dressing: Moderate assistance   Toilet Transfer: Minimal assistance           Functional mobility during ADLs: Minimal assistance General ADL Comments: Difficulty using left hand functionally, weak grasp. de;ayed release.  Proximal weakness.  Decreased balance, limited sensation LLE     Vision                     Perception Perception Perception Tested?: Yes   Praxis Praxis Praxis tested?: Within functional limits    Pertinent Vitals/Pain VSS, denies pain     Hand Dominance Right   Extremity/Trunk Assessment Upper Extremity Assessment Upper Extremity Assessment: LUE deficits/detail LUE Deficits / Details: grossly 3/5, L UE drift, grip 3-/5 LUE Coordination: decreased fine motor;decreased gross motor   Lower Extremity Assessment Lower Extremity Assessment: Defer to PT evaluation LLE Coordination: decreased gross motor   Cervical / Trunk Assessment Cervical / Trunk Assessment: Normal   Communication Communication Communication: No difficulties   Cognition Arousal/Alertness: Awake/alert Behavior During Therapy: WFL for tasks assessed/performed Overall Cognitive Status: Within Functional Limits for tasks assessed  General Comments       Exercises       Shoulder Instructions      Home Living Family/patient expects to be discharged to:: Private residence Living Arrangements: Alone Available Help at Discharge: Family;Available 24 hours/day Type of Home: Apartment Home Access: Level entry      Home Layout: One level     Bathroom Shower/Tub: Tub/shower unit Shower/tub characteristics: Architectural technologist: Standard Bathroom Accessibility: Yes How Accessible: Accessible via walker Home Equipment: Environmental consultant - 2 wheels      Lives With: Alone    Prior Functioning/Environment Level of Independence: Independent        Comments: drives. Retired for 8 years; eats out alot    OT Diagnosis: Generalized weakness;Hemiplegia non-dominant side   OT Problem List: Decreased strength;Impaired balance (sitting and/or standing);Impaired tone;Decreased range of motion;Decreased safety awareness;Cardiopulmonary status limiting activity;Decreased activity tolerance;Impaired sensation;Impaired UE functional use   OT Treatment/Interventions: Self-care/ADL training;Therapeutic exercise;Neuromuscular education;Energy conservation;DME and/or AE instruction;Manual therapy;Splinting;Therapeutic activities;Patient/family education;Balance training    OT Goals(Current goals can be found in the care plan section) Acute Rehab OT Goals Patient Stated Goal: Inpatient rehab OT Goal Formulation: With patient Time For Goal Achievement: 12/06/13 Potential to Achieve Goals: Good  OT Frequency: Min 2X/week   Barriers to D/C:            Co-evaluation              End of Session    Activity Tolerance: Patient tolerated treatment well Patient left: in chair;with call bell/phone within reach;with family/visitor present   Time: 1525-1540 OT Time Calculation (min): 15 min Charges:  OT General Charges $OT Visit: 1 Procedure OT Evaluation $Initial OT Evaluation Tier I: 1 Procedure OT Treatments $Self Care/Home Management : 8-22 mins G-Codes:    Mariah Milling 12/29/2013, 3:49 PM

## 2013-11-29 NOTE — Progress Notes (Signed)
Note: This document was prepared with digital dictation and possible smart phrase technology. Any transcriptional errors that result from this process are unintentional.   Scott Conley HCW:[id number] DOB: Dec 22, 1940 DOA: 11/26/2013 PCP: No primary provider on file.  Brief narrative: 73 y/o ?, no significant pmh admitted 11/26/13 with sudden L sdied [employer], Slurrred speech, accelerated HTN.  He has poor health literacy of at baseline and has not followed with a doctor for many years  Past medical history-As per Problem list Chart reviewed as below- reviewed  Consultants:  Neurology  Procedures:  CT head  Echocardiogram pending  Carotid Doppler shows 1-39% bilateral stenosis only  MRI  5/3 -shows multifocal areas of acute infarct right hemisphere, thrombosis right middle cerebral artery  Antibiotics:   None   Subjective   Went for TEE and had secretions and elevated pressure. Currently hungry and wants to eat. Loop recorder being discussed for today vs am NO     Objective    Interim History: No acute findings  Telemetry: Sinus rhythm 90   Objective: Filed Vitals:   11/29/13 1305 11/29/13 1310 11/29/13 1325 11/29/13 1408  BP: 160/90 163/92 142/87 187/92  Pulse: 83 80 78 78  Temp:      TempSrc:      Resp: 18 20 13    Height:      Weight:      SpO2: 99% 97% 99% 98%    Intake/Output Summary (Last 24 hours) at 11/29/13 1501 Last data filed at 11/29/13 0600  Gross per 24 hour  Intake    120 ml  Output    400 ml  Net   -280 ml    Exam:  General: Alert pleasant oriented, facial twisting to the right, extraocular movements intact, vision by direct confrontation is normal Smile symmetrical Cardiovascular: S1-S2 no murmur rub or gallop no displaced PMI Respiratory: Clear  Data Reviewed: Basic Metabolic Panel:  Recent Labs Lab 11/26/13 1730 11/26/13 1738 11/26/13 2050 11/28/13 0354  NA 138 137 140 138  K 5.3 6.6* 3.9 3.7  CL 101 106 102 100    CO2 21  --  22 20  GLUCOSE 129* 128* 117* 105*  BUN 10 11 9 14   CREATININE 0.85 1.00 0.89 0.87  CALCIUM 9.5  --  9.4 9.3   Liver Function Tests:  Recent Labs Lab 11/26/13 1730 11/28/13 0354  AST 25 26  ALT 18 16  ALKPHOS 82 74  BILITOT 0.6 0.8  PROT 8.3 7.7  ALBUMIN 3.9 3.6   No results found for this basename: LIPASE, AMYLASE,  in the last 168 hours No results found for this basename: AMMONIA,  in the last 168 hours CBC:  Recent Labs Lab 11/26/13 1730 11/26/13 1738  WBC 8.6  --   NEUTROABS 6.0  --   HGB 14.5 16.3  HCT 43.7 48.0  MCV 79.5  --   PLT 327  --    Cardiac Enzymes: No results found for this basename: CKTOTAL, CKMB, CKMBINDEX, TROPONINI,  in the last 168 hours BNP: No components found with this basename: POCBNP,  CBG: No results found for this basename: GLUCAP,  in the last 168 hours  No results found for this or any previous visit (from the past 240 hour(s)).   Studies:              All Imaging reviewed and is as per above notation   Scheduled Meds: . aspirin  325 mg Oral Daily  . atorvastatin  80  mg Oral q1800  . carvedilol  6.25 mg Oral BID WC  . enoxaparin (LOVENOX) injection  55 mg Subcutaneous Q24H  . metFORMIN  500 mg Oral Q breakfast   Continuous Infusions: . sodium chloride 20 mL/hr (11/29/13 1140)     Assessment/Plan: 1. Probable right MCA infarct resulting in face hand greater than leg weakness. On stroke pathway--work up completed although TEE suboptimal, bubble study neg by saline injection. Will be having Loop recorder placed this pm.  Likely d/c to CIR in am 2. Frothy blood tinged sputum-no symptoms now.  ?Anesthesia/preocdure related.  Monitor overnight 3. Accelerated hypertension-patient was allowed to rest hypertension 5/1 now will currently control more aggressively start Coreg 3.125 twice a day--if remains elevated, would uptitrate Coreg to 6.25 4. Hyperlipidemia-LDL 135. Secondary prevention will start intensity statin  atorvastatin 80 mg daily 5. Diabetes, A1c 7.4-started metformin 500 mg will not aggressively control. Get dietary education from coordinator in the morning  Code Status: Full Family Communication: ? # fr HCPOA-[name],[name] [phone] Disposition Plan: likely d/c to CIR in am  Verneita Griffes, MD  [facility] Pager [phone] 11/29/2013, 3:01 PM    LOS: 3 days

## 2013-11-29 NOTE — Progress Notes (Signed)
Stroke Team Progress Note  HISTORY Scott Conley is a 73 y.o. male with a history of no new medical problems given that he does not go to physicians frequently. He went to bed yesterday evening (4/30). He woke the next morning with left-sided facial droop and arm weakness. He states that this is been a static deficit since he noticed it. Patient was not administered TPA secondary to being outside the window. He was admitted to [facility] for further evaluation and treatment.  SUBJECTIVE Patient with lots of questions related to dx and treatment that were addressed by the stroke team.  OBJECTIVE Most recent Vital Signs: Filed Vitals:   11/29/13 0000 11/29/13 0400 11/29/13 0839 11/29/13 0840  BP: 148/83 146/78 152/73 140/73  Pulse: 69 84 84 83  Temp: 97.6 F (36.4 C) 97.6 F (36.4 C) 97.4 F (36.3 C)   TempSrc: Oral Oral Oral   Resp: 18 18 18    Height:      Weight:  109.453 kg (241 lb 4.8 oz)    SpO2: 95% 95% 99%    CBG (last 3)  No results found for this basename: GLUCAP,  in the last 72 hours  IV Fluid Intake:   . sodium chloride      MEDICATIONS  . aspirin  325 mg Oral Daily  . atorvastatin  80 mg Oral q1800  . carvedilol  6.25 mg Oral BID WC  . enoxaparin (LOVENOX) injection  55 mg Subcutaneous Q24H  . living well with diabetes book   Does not apply Once  . metFORMIN  500 mg Oral Q breakfast   PRN:  hydrALAZINE  Diet:  NPO for TEE Activity:   Up with assistance DVT Prophylaxis: lovenox  CLINICALLY SIGNIFICANT STUDIES Basic Metabolic Panel:   Recent Labs Lab 11/26/13 2050 11/28/13 0354  NA 140 138  K 3.9 3.7  CL 102 100  CO2 22 20  GLUCOSE 117* 105*  BUN 9 14  CREATININE 0.89 0.87  CALCIUM 9.4 9.3   Liver Function Tests:   Recent Labs Lab 11/26/13 1730 11/28/13 0354  AST 25 26  ALT 18 16  ALKPHOS 82 74  BILITOT 0.6 0.8  PROT 8.3 7.7  ALBUMIN 3.9 3.6   CBC:   Recent Labs Lab 11/26/13 1730 11/26/13 1738  WBC 8.6  --   NEUTROABS 6.0  --   HGB  14.5 16.3  HCT 43.7 48.0  MCV 79.5  --   PLT 327  --    Coagulation:   Recent Labs Lab 11/26/13 1730  LABPROT 13.9  INR 1.09   Cardiac Enzymes: No results found for this basename: CKTOTAL, CKMB, CKMBINDEX, TROPONINI,  in the last 168 hours Urinalysis:   Recent Labs Lab 11/26/13 1757  COLORURINE YELLOW  LABSPEC 1.019  PHURINE 6.0  GLUCOSEU NEGATIVE  HGBUR NEGATIVE  BILIRUBINUR NEGATIVE  KETONESUR NEGATIVE  PROTEINUR NEGATIVE  UROBILINOGEN 1.0  NITRITE NEGATIVE  LEUKOCYTESUR TRACE*   Lipid Panel    Component Value Date/Time   CHOL 196 11/27/2013 0521   TRIG 89 11/27/2013 0521   HDL 43 11/27/2013 0521   CHOLHDL 4.6 11/27/2013 0521   VLDL 18 11/27/2013 0521   LDLCALC 135* 11/27/2013 0521   HgbA1C  Lab Results  Component Value Date   HGBA1C 7.4* 11/27/2013    Urine Drug Screen:     Component Value Date/Time   LABOPIA NONE DETECTED 11/26/2013 1757   COCAINSCRNUR NONE DETECTED 11/26/2013 1757   LABBENZ NONE DETECTED 11/26/2013 1757   AMPHETMU  NONE DETECTED 11/26/2013 1757   THCU NONE DETECTED 11/26/2013 1757   LABBARB NONE DETECTED 11/26/2013 1757    Alcohol Level:   Recent Labs Lab 11/26/13 [place] <11     MRI of the brain  11/27/2013 Multifocal areas of restricted diffusion affect the right hemisphere consistent with acute infarction. There is no associated hemorrhage. Mild atrophy and small vessel disease.   MRA of the brain 11/27/2013 Suspected acute thrombosis of one or more right middle cerebral artery M3 vessels. Intracranial atherosclerotic change is seen more proximally in a right MCA M2 branch.  2D Echocardiogram EF 55-60% with no source of embolus.   Carotid Doppler  No evidence of hemodynamically significant internal carotid artery stenosis. Vertebral artery flow is antegrade.   CXR  11/27/2013 No evidence of acute cardiopulmonary disease.  EKG  normal sinus rhythm   Therapy Recommendations CIR  Physical Exam   Mental Status:  Patient is awake, alert, oriented  to person, place, month, year, and situation.  Immediate and remote memory are intact.  Patient is able to give a clear and coherent history.  No signs of aphasia or neglect  Cranial Nerves:  II: Visual Fields are full. Pupils are equal, round, and reactive to light.  III,IV, VI: EOMI without ptosis or diploplia.  V: Facial sensation is symmetric to temperature  VII: Facial movement is notable for droop on left  VIII: hearing is intact to voice  X: Uvula elevates symmetrically  XI: Shoulder shrug is symmetric.  XII: tongue is midline without atrophy or fasciculations.  Motor:  Tone is normal. Bulk is normal. 5/5 strength was present on the right side, on the left he has a distal much greater than proximal weakness of the left upper extremity and possible very mild weakness of the left lower extremity(5-/5).  Sensory:  Sensation is symmetric to light touch and temperature in the arms and legs.  Deep Tendon Reflexes:  2+ and symmetric in the biceps and patellae.  Plantars:  Toes are downgoing bilaterally.    ASSESSMENT [name] is a 73 y.o. male presenting with left sided facial droop and LUE weakness. No TPA given as he is outside the window. MRI confirmed right hemispheric infarcts, felt to be embolic due to unknown source. On no antithrombotics prior to admission. Now on ASA 325mg  daily for secondary stroke prevention. Patient with resultant left hemiparesis, IP rehab planned. Stroke work up underway.  Hyperlipidemia, LDL 135, on no statin PTA, now on Lipitor 80mg  daily, goal LDL < 70 for diabetics  Diabetes, HgbA1c 7.4, goal < 7.0  Cigarette smoker  Hx right leg DVT 2005, no etiology known  Hospital day # 3  TREATMENT/PLAN  Continue ASA 325mg  daily for secondary stroke prevention  Lipitor 80mg  daily  TEE to look for embolic source. Arranged with [place] for today at 1245. If positive for PFO (patent foramen ovale), check bilateral  lower extremity venous dopplers to rule out DVT as possible source of stroke. If TEE negative, a [place] electrophysiologist will consult and consider placement of an place implantable loop recorder to evaluate for atrial fibrillation as etiology of stroke. This has been explained to patient/family by Dr. Leonie Man and they are agreeable.   Rehab consult  Ongoing Risk factor management  Burnetta Sabin, MSN, RN, ANVP-BC, ANP-BC, GNP-BC [facility] Pager: [phone] 11/29/2013 10:42 AM  I have personally obtained a history, examined the patient, evaluated imaging results, and  formulated the assessment and plan of care. I agree with the above. Antony Contras, MD    To contact Stroke Continuity provider, please refer to [email or web address] After hours, contact General Neurology

## 2013-11-29 NOTE — CV Procedure (Addendum)
       PROCEDURE NOTE  Procedure:  Transesophageal echocardiogram Operator:  Fransico Him, MD Indications:  CVA Complications: blood tinged sputum with increased respiratory secretions and marked HTN at 236/153mmHg  requiring Lopressor 2.5mg  IV IV Meds:  Versed 2mg  IV, Fentanyl 13mcg IV, Lopressor 2.5mg  IV  Results: Normal LV size and function Mildly dilated RV with normal systolic function Mildly dilated RA Normal LA and LA appendage Normal TV with trivial TR Normal trileaflet AV with trivial AR Normal MV Normal PV with trivial PR Normal Ascending and thoracic aorta  TEE had to be aborted before bubble study due to increased respiratory secretions with severe cough and blood tinged secretions suctioned.  Beside saline contrast study performed which showed was negative for interatrial shunt on 3 separate injections in apical four chamber, parasternal short axis and epigastric views.    No obvious source of embolism  Patient tolerated procedure and was transferred back to his room in stable condition.

## 2013-11-29 NOTE — Progress Notes (Addendum)
Inpatient Diabetes Program Recommendations  AACE/ADA: New Consensus Statement on Inpatient Glycemic Control (2013)  Target Ranges:  Prepandial:   less than 140 mg/dL      Peak postprandial:   less than 180 mg/dL (1-2 hours)      Critically ill patients:  140 - 180 mg/dL     Results for EMANNUEL, VISE (MRN [id number]) as of 11/29/2013 07:49  Ref. Range 11/27/2013 05:21  Hemoglobin A1C Latest Range: <5.7 % 7.4 (H)     Admitted with CVA, accelerated HTN.  No medical care for years.  No PCP.  Diagnosed with DM this admission per Dr. Verlon Au.  Note patient started on Metformin 500 mg daily today.  DM Coordinator to see pt today and make sure RNs providing ongoing DM education with patient at bedside.  Patient may need Inpatient Rehab stay.   MD- Please start Novolog Sensitive SSI tid ac + HS   Will follow Wyn Quaker RN, MSN, CDE Diabetes Coordinator Inpatient Diabetes Program Team Pager: [phone] (8a-10p)   Addendum 1220pm:  Spoke with pt about new diagnosis.  Discussed A1C results with him and explained what an A1C is, basic pathophysiology of DM Type 2, basic home care, importance of checking CBGs and maintaining good CBG control to prevent long-term and short-term complications.  Reviewed Metformin with patient and how to take, side effects, etc.  Also reviewed basic DM diet information with patient as well.  RNs to provide ongoing basic DM education at bedside with this patient.  Have ordered educational booklet and DM videos.

## 2013-11-29 NOTE — Progress Notes (Signed)
  Echocardiogram Echocardiogram Transesophageal has been performed.  Scott Conley Scott Conley 11/29/2013, 2:04 PM

## 2013-11-29 NOTE — Progress Notes (Signed)
Medicare initial IM given to pt and signed copy placed in shadow chart

## 2013-11-29 NOTE — PMR Pre-admission (Deleted)
PMR Admission Coordinator Pre-Admission Assessment  Patient: Scott Conley is an 73 y.o., male MRN: [id number] DOB: [birth year] Height: 6\' 4"  (193 cm) Weight: 109.453 kg (241 lb 4.8 oz)              Insurance Information HMO:     PPO:      PCP:      IPA:      80/20: yes     OTHER: no HMO PRIMARY: Medicare a and b      Policy#: [id number]      Subscriber: pt Benefits:  Phone #: [email or web address] [email or web address]     Name: 11/29/13 Eff. Date: a 09/26/04 b 06/28/06     Deduct: $1260      Out of Pocket Max: none      Life Max: none CIR: 100%      SNF: 20 full days Outpatient: 80%     Co-Pay: 20% Home Health: 100%      Co-Pay: none DME: 80%     Co-Pay: 20% Providers: pt choice  SECONDARY: none       Medicaid Application Date:       Case Manager:  Disability Application Date:       Case Worker:   Emergency Contact Information Contact Information   Name Relation Home Work Mobile   [place] Daughter   [place]  [phone]     [name]    [phone]     Current Medical History  Patient Admitting Diagnosis:Right MCA distribution infarct with left hemiparesis   History of Present Illness: Scott Conley is a 73 y.o. right-handed male on no scheduled medications. Presented 11/26/2013 with left-sided weakness and slurred speech. Noted elevated systolic blood pressure in the 200s as well as findings of potassium 6.6. MRI of the brain shows right hemispheric acute infarct. MRA of the head with atherosclerotic type changes. Echocardiogram with ejection fraction of 60% no wall motion abnormalities. Carotid Dopplers with no ICA stenosis. Patient did not receive TPA. Coreg was added for hypertension control. TEE is pending and possible LOOP recorder for 11/29/13. Neurology followup maintain on aspirin for CVA prophylaxis as well as subcutaneous Lovenox for DVT prophylaxis.  Elevated hemoglobin A1c of 7.4 Glucophage initiated. New diagnosis for DM per pt.  Cardiology consulted for pt on telemetry  which has demonstrated no arrhythmias after cryptogenic stroke. No cause identified. TEE 11/27/13. EP placed an implantable loop recorder to monitor for atrial fibrillation on 11/29/13.  Patient is retired he lives alone but has a daughter that can help him post discharge. He was independent prior to admission   Total: 5  NIHSS    Past Medical History  Past Medical History  Diagnosis Date  . Blood clot in vein   . Arthritis   . Diabetes   . CVA (cerebral infarction)   . Hypertension     Family History  family history is not on file.  Prior Rehab/Hospitalizations: none  Current Medications  Current facility-administered medications:aspirin tablet 325 mg, 325 mg, Oral, Daily, [name], MD, 325 mg at 11/30/13 0952;  atorvastatin (LIPITOR) tablet 80 mg, 80 mg, Oral, q1800, Nita Sells, MD, 80 mg at 11/29/13 1712;  carvedilol (COREG) tablet 6.25 mg, 6.25 mg, Oral, BID WC, Nita Sells, MD, 6.25 mg at 11/30/13 0952 enoxaparin (LOVENOX) injection 55 mg, 55 mg, Subcutaneous, Q24H, Darnell Level Mancheril, RPH, 55 mg at 11/30/13 9833;  hydrALAZINE (APRESOLINE) injection 10 mg, 10 mg, Intravenous, Q6H PRN, Nita Sells, MD;  metFORMIN (GLUCOPHAGE)  tablet 500 mg, 500 mg, Oral, Q breakfast, Nita Sells, MD, 500 mg at 11/30/13 6578  Patients Current Diet: Carb Control for TEE 11/29/12. 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 Retired for 8 years; eats out Company secretary  Current Functional Level Cognition  Arousal/Alertness:  Awake/alert Overall Cognitive Status: Within Functional Limits for tasks assessed Orientation Level: Oriented X4 Memory: Appears intact Awareness: Appears intact Problem Solving: Appears intact Safety/Judgment: Appears intact    Extremity Assessment (includes Sensation/Coordination)          ADLs  Overall ADL's : Needs assistance/impaired Grooming: Wash/dry hands;Wash/dry face Upper Body Bathing: Minimal assitance Lower Body Bathing: Moderate assistance Upper Body Dressing : Minimal assistance Lower Body Dressing: Moderate assistance Toilet Transfer: Minimal assistance Functional mobility during ADLs: Minimal assistance General ADL Comments: Difficulty using left hand functionally, weak grasp. de;ayed release.  Proximal weakness.  Decreased balance, limited sensation LLE    Mobility  Overal bed mobility: Needs Assistance Bed Mobility: Supine to Sit Supine to sit: Min guard;HOB elevated General bed mobility comments: increased time, definite use of UEs    Transfers  Overall transfer level: Needs assistance Equipment used: 1 person hand held assist Transfers: Sit to/from Stand Sit to Stand: Min guard General transfer comment: cues for hand placement & technique.  Performed 5x's for strengthening, technique, & activiy tolerance.      Ambulation / Gait / Stairs / Wheelchair Mobility  Ambulation/Gait Ambulation/Gait assistance: Min guard;Min Wellsite geologist (Feet): 150 Feet Assistive device: Rolling walker (2 wheeled);None Gait Pattern/deviations: Step-through pattern;Decreased stride length Gait velocity: slow General Gait Details: ocassional min (A) with turns due to mild unsteadiness.  Cues for safe RW management & body positioning inside RW.      Posture / Balance      Special needs/care consideration Bowel mgmt: continent Bladder mgmt: condom catheter Diabetic mgmt Hgb A1C 7.4 new dx od DM per pt. Will need diet education, monitoring education, med  management, etc.   Previous Home Environment Living Arrangements: Alone  Lives With: Alone Available Help at Discharge: Family;Available 24 hours/day Type of Home: Apartment Home Layout: One level Home Access: Level entry Bathroom Shower/Tub: Chiropodist: Standard Bathroom Accessibility: Yes How Accessible: Accessible via walker Home Care Services: No  Discharge Living Setting Plans for Discharge Living Setting: Patient's home;Apartment;Alone Type of Home at Discharge: Apartment Discharge Home Layout: One level Discharge Home Access: Level entry Discharge Bathroom Shower/Tub: Tub/shower unit Discharge Bathroom Toilet: Standard Discharge Bathroom Accessibility: Yes How Accessible: Accessible via walker Does the patient have any problems obtaining your medications?: No  Social/Family/Support Systems Patient Roles: Parent;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) Contact Information: Arita Miss, his two daughters Anticipated Caregiver: [name] to come for one week after d/c; otherwise [name] prn and two sons prn Anticipated Caregiver's Contact Information: see above Ability/Limitations of Caregiver: [name] lives in [place], [name] and two local sons also work Caregiver Availability: Other (Comment) [name] for one week 24/7 then prn assist) Discharge Plan Discussed with Primary Caregiver: Yes Is Caregiver In Agreement with Plan?: Yes Does Caregiver/Family have Issues with Lodging/Transportation while Pt is in Rehab?: No  Goals/Additional Needs Patient/Family  Goal for Rehab: Mod I with PT, OT, and SLP Expected length of stay: ELOS 10 to 12 days Dietary Needs: Carb Modified; new diabetic dx this admission Pt/Family Agrees to Admission and willing to participate: Yes Program Orientation Provided & Reviewed with Pt/Caregiver Including Roles  & Responsibilities: Yes   Decrease burden of Care through IP rehab admission: n/a  Possible need for SNF placement  upon discharge:n/a   Patient Condition: This patient's condition remains as documented in the consult dated 11/29/13, in which the Rehabilitation Physician determined and documented that the patient's condition is appropriate for intensive rehabilitative care in an inpatient rehabilitation facility. Will admit to inpatient rehab today.  Preadmission Screen Completed By:  Cleatrice Burke, 11/30/2013 12:00 PM ______________________________________________________________________   Discussed status with Dr. Naaman Plummer on 11/29/13 at  1223 and received telephone approval for admission today. Patient not admitted 11/29/13 due to delay in implantation of Loop recorder after TEE with increased secretions and elevated BP during TEE 11/29/13.   Discussed status with Dr. Naaman Plummer on 11/30/13 at 33 and received telephone approval for admission today.  Admission Coordinator:  Cleatrice Burke, time 8887 Date 11/29/2013. We will plan admit today. 11/30/13 at 1200.

## 2013-11-30 ENCOUNTER — Inpatient Hospital Stay (HOSPITAL_COMMUNITY)
Admission: RE | Admit: 2013-11-30 | Discharge: 2013-12-10 | DRG: 945 | Disposition: A | Discharge status: Home Health | Payer: Medicare Other | Source: Intra-hospital | Attending: Physical Medicine & Rehabilitation | Admitting: Physical Medicine & Rehabilitation

## 2013-11-30 ENCOUNTER — Encounter (HOSPITAL_COMMUNITY): Payer: Self-pay | Admitting: Cardiology

## 2013-11-30 DIAGNOSIS — R29898 Other symptoms and signs involving the musculoskeletal system: Secondary | ICD-10-CM | POA: Diagnosis present

## 2013-11-30 DIAGNOSIS — Z7982 Long term (current) use of aspirin: Secondary | ICD-10-CM

## 2013-11-30 DIAGNOSIS — Z5189 Encounter for other specified aftercare: Secondary | ICD-10-CM | POA: Diagnosis not present

## 2013-11-30 DIAGNOSIS — I6330 Cerebral infarction due to thrombosis of unspecified cerebral artery: Secondary | ICD-10-CM | POA: Diagnosis not present

## 2013-11-30 DIAGNOSIS — I6350 Cerebral infarction due to unspecified occlusion or stenosis of unspecified cerebral artery: Secondary | ICD-10-CM

## 2013-11-30 DIAGNOSIS — R4789 Other speech disturbances: Secondary | ICD-10-CM | POA: Diagnosis present

## 2013-11-30 DIAGNOSIS — Z8673 Personal history of transient ischemic attack (TIA), and cerebral infarction without residual deficits: Secondary | ICD-10-CM | POA: Diagnosis not present

## 2013-11-30 DIAGNOSIS — E119 Type 2 diabetes mellitus without complications: Secondary | ICD-10-CM

## 2013-11-30 DIAGNOSIS — I6340 Cerebral infarction due to embolism of unspecified cerebral artery: Secondary | ICD-10-CM | POA: Diagnosis present

## 2013-11-30 DIAGNOSIS — Z602 Problems related to living alone: Secondary | ICD-10-CM

## 2013-11-30 DIAGNOSIS — Z79899 Other long term (current) drug therapy: Secondary | ICD-10-CM | POA: Diagnosis not present

## 2013-11-30 DIAGNOSIS — I639 Cerebral infarction, unspecified: Secondary | ICD-10-CM | POA: Diagnosis present

## 2013-11-30 DIAGNOSIS — I10 Essential (primary) hypertension: Secondary | ICD-10-CM | POA: Diagnosis present

## 2013-11-30 DIAGNOSIS — Y9210 Unspecified residential institution as the place of occurrence of the external cause: Secondary | ICD-10-CM | POA: Diagnosis not present

## 2013-11-30 DIAGNOSIS — B370 Candidal stomatitis: Secondary | ICD-10-CM | POA: Diagnosis present

## 2013-11-30 DIAGNOSIS — R197 Diarrhea, unspecified: Secondary | ICD-10-CM | POA: Diagnosis not present

## 2013-11-30 DIAGNOSIS — E785 Hyperlipidemia, unspecified: Secondary | ICD-10-CM | POA: Diagnosis present

## 2013-11-30 DIAGNOSIS — K5900 Constipation, unspecified: Secondary | ICD-10-CM | POA: Diagnosis present

## 2013-11-30 DIAGNOSIS — F17200 Nicotine dependence, unspecified, uncomplicated: Secondary | ICD-10-CM | POA: Diagnosis present

## 2013-11-30 DIAGNOSIS — T383X5A Adverse effect of insulin and oral hypoglycemic [antidiabetic] drugs, initial encounter: Secondary | ICD-10-CM | POA: Diagnosis not present

## 2013-11-30 LAB — CBC WITH DIFFERENTIAL/PLATELET
Basophils Absolute: 0 10*3/uL (ref 0.0–0.1)
Basophils Relative: 0 % (ref 0–1)
Eosinophils Absolute: 0.2 10*3/uL (ref 0.0–0.7)
Eosinophils Relative: 2 % (ref 0–5)
HCT: 45.2 % (ref 39.0–52.0)
HEMOGLOBIN: 15.1 g/dL (ref 13.0–17.0)
Lymphocytes Relative: 25 % (ref 12–46)
Lymphs Abs: 2.6 10*3/uL (ref 0.7–4.0)
MCH: 26.8 pg (ref 26.0–34.0)
MCHC: 33.4 g/dL (ref 30.0–36.0)
MCV: 80.1 fL (ref 78.0–100.0)
MONO ABS: 0.6 10*3/uL (ref 0.1–1.0)
MONOS PCT: 6 % (ref 3–12)
Neutro Abs: 6.8 10*3/uL (ref 1.7–7.7)
Neutrophils Relative %: 67 % (ref 43–77)
Platelets: 276 10*3/uL (ref 150–400)
RBC: 5.64 MIL/uL (ref 4.22–5.81)
RDW: 16.2 % — ABNORMAL HIGH (ref 11.5–15.5)
WBC: 10.3 10*3/uL (ref 4.0–10.5)

## 2013-11-30 LAB — COMPREHENSIVE METABOLIC PANEL
ALK PHOS: 78 U/L (ref 39–117)
ALT: 28 U/L (ref 0–53)
AST: 34 U/L (ref 0–37)
Albumin: 3.9 g/dL (ref 3.5–5.2)
BUN: 27 mg/dL — AB (ref 6–23)
CALCIUM: 9.8 mg/dL (ref 8.4–10.5)
CO2: 22 mEq/L (ref 19–32)
Chloride: 101 mEq/L (ref 96–112)
Creatinine, Ser: 1.13 mg/dL (ref 0.50–1.35)
GFR calc non Af Amer: 62 mL/min — ABNORMAL LOW (ref >90)
GFR, EST AFRICAN AMERICAN: 72 mL/min — AB (ref >90)
GLUCOSE: 116 mg/dL — AB (ref 70–99)
Potassium: 4 mEq/L (ref 3.7–5.3)
Sodium: 139 mEq/L (ref 137–147)
TOTAL PROTEIN: 8.1 g/dL (ref 6.0–8.3)
Total Bilirubin: 0.8 mg/dL (ref 0.3–1.2)

## 2013-11-30 LAB — GLUCOSE, CAPILLARY
GLUCOSE-CAPILLARY: 119 mg/dL — AB (ref 70–99)
Glucose-Capillary: 110 mg/dL — ABNORMAL HIGH (ref 70–99)

## 2013-11-30 MED ORDER — ASPIRIN 325 MG PO TABS: 325.0000 mg | ORAL_TABLET | Freq: Every day | ORAL | Status: DC

## 2013-11-30 MED ORDER — ENOXAPARIN SODIUM 60 MG/0.6ML ~~LOC~~ SOLN
55.0000 mg | SUBCUTANEOUS | Status: DC
  Administered 2013-12-01 – 2013-12-09 (×9): 55 mg via SUBCUTANEOUS
  Filled 2013-11-30 (×10): qty 0.6

## 2013-11-30 MED ORDER — CARVEDILOL 6.25 MG PO TABS
6.2500 mg | ORAL_TABLET | Freq: Two times a day (BID) | ORAL | Status: DC
  Administered 2013-11-30 – 2013-12-10 (×20): 6.25 mg via ORAL
  Filled 2013-11-30 (×22): qty 1

## 2013-11-30 MED ORDER — ONDANSETRON HCL 4 MG/2ML IJ SOLN: 4.0000 mg | Freq: Four times a day (QID) | INTRAMUSCULAR | Status: DC | PRN

## 2013-11-30 MED ORDER — SORBITOL 70 % SOLN: 30.0000 mL | Freq: Every day | Status: DC | PRN

## 2013-11-30 MED ORDER — ATORVASTATIN CALCIUM 80 MG PO TABS: 80.0000 mg | ORAL_TABLET | Freq: Every day | ORAL | Status: DC

## 2013-11-30 MED ORDER — INSULIN ASPART 100 UNIT/ML ~~LOC~~ SOLN
0.0000 [IU] | SUBCUTANEOUS | Status: DC
  Administered 2013-12-01 (×3): 2 [IU] via SUBCUTANEOUS

## 2013-11-30 MED ORDER — ACETAMINOPHEN 325 MG PO TABS: 325.0000 mg | ORAL_TABLET | ORAL | Status: DC | PRN

## 2013-11-30 MED ORDER — METFORMIN HCL 500 MG PO TABS: 500.0000 mg | ORAL_TABLET | Freq: Every day | ORAL | Status: DC

## 2013-11-30 MED ORDER — ONDANSETRON HCL 4 MG PO TABS: 4.0000 mg | ORAL_TABLET | Freq: Four times a day (QID) | ORAL | Status: DC | PRN

## 2013-11-30 MED ORDER — ATORVASTATIN CALCIUM 80 MG PO TABS
80.0000 mg | ORAL_TABLET | Freq: Every day | ORAL | Status: DC
  Administered 2013-11-30 – 2013-12-09 (×10): 80 mg via ORAL
  Filled 2013-11-30 (×11): qty 1

## 2013-11-30 MED ORDER — ASPIRIN 325 MG PO TABS
325.0000 mg | ORAL_TABLET | Freq: Every day | ORAL | Status: DC
  Administered 2013-11-30 – 2013-12-10 (×11): 325 mg via ORAL
  Filled 2013-11-30 (×13): qty 1

## 2013-11-30 MED ORDER — NYSTATIN 100000 UNIT/ML MT SUSP
5.0000 mL | Freq: Four times a day (QID) | OROMUCOSAL | Status: AC
  Administered 2013-11-30 – 2013-12-03 (×12): 500000 [IU] via ORAL
  Filled 2013-11-30 (×12): qty 5

## 2013-11-30 MED ORDER — METFORMIN HCL 500 MG PO TABS
500.0000 mg | ORAL_TABLET | Freq: Every day | ORAL | Status: DC
  Filled 2013-11-30 (×2): qty 1

## 2013-11-30 MED ORDER — CARVEDILOL 6.25 MG PO TABS
6.2500 mg | ORAL_TABLET | Freq: Two times a day (BID) | ORAL | Status: DC
Start: 2013-11-30 — End: 2013-12-10

## 2013-11-30 MED ORDER — ENOXAPARIN SODIUM 60 MG/0.6ML ~~LOC~~ SOLN: 55.0000 mg | SUBCUTANEOUS | Status: DC

## 2013-11-30 NOTE — Progress Notes (Signed)
Patient received at 1345 alert and oriented x 4 . No complaint of pain . Skin intact . Oriented patient to room and call bell system . Patient verbalized understanding of admission process .  Continue with plan of care .           Scott Conley

## 2013-11-30 NOTE — Progress Notes (Signed)
Stroke Team Progress Note  HISTORY Scott Conley is a 73 y.o. male with a history of no new medical problems given that he does not go to physicians frequently. He went to bed yesterday evening (4/30). He woke the next morning with left-sided facial droop and arm weakness. He states that this is been a static deficit since he noticed it. Patient was not administered TPA secondary to being outside the window. He was admitted to [facility] for further evaluation and treatment.  SUBJECTIVE Patient without complaints. Excited about plans for d/c to CIR today.  OBJECTIVE Most recent Vital Signs: Filed Vitals:   11/29/13 2000 11/30/13 0000 11/30/13 0400 11/30/13 0731  BP: 152/83 147/92 144/79 139/92  Pulse: 80 81 56 76  Temp: 97.9 F (36.6 C) 97.5 F (36.4 C) 97.7 F (36.5 C) 97.8 F (36.6 C)  TempSrc: Oral Oral Oral Oral  Resp: 18 18 18 17   Height:      Weight:   109.453 kg (241 lb 4.8 oz)   SpO2: 96% 95% 98% 93%   CBG (last 3)  No results found for this basename: GLUCAP,  in the last 72 hours  IV Fluid Intake:      MEDICATIONS  . aspirin  325 mg Oral Daily  . atorvastatin  80 mg Oral q1800  . carvedilol  6.25 mg Oral BID WC  . enoxaparin (LOVENOX) injection  55 mg Subcutaneous Q24H  . metFORMIN  500 mg Oral Q breakfast   PRN:  hydrALAZINE  Diet:  Carb Control thin liquids Activity:   Up with assistance DVT Prophylaxis: lovenox  CLINICALLY SIGNIFICANT STUDIES Basic Metabolic Panel:   Recent Labs Lab 11/26/13 2050 11/28/13 0354  NA 140 138  K 3.9 3.7  CL 102 100  CO2 22 20  GLUCOSE 117* 105*  BUN 9 14  CREATININE 0.89 0.87  CALCIUM 9.4 9.3   Liver Function Tests:   Recent Labs Lab 11/26/13 1730 11/28/13 0354  AST 25 26  ALT 18 16  ALKPHOS 82 74  BILITOT 0.6 0.8  PROT 8.3 7.7  ALBUMIN 3.9 3.6   CBC:   Recent Labs Lab 11/26/13 1730 11/26/13 1738  WBC 8.6  --   NEUTROABS 6.0  --   HGB 14.5 16.3  HCT 43.7 48.0  MCV 79.5  --   PLT 327  --     Coagulation:   Recent Labs Lab 11/26/13 1730  LABPROT 13.9  INR 1.09   Cardiac Enzymes: No results found for this basename: CKTOTAL, CKMB, CKMBINDEX, TROPONINI,  in the last 168 hours Urinalysis:   Recent Labs Lab 11/26/13 1757  COLORURINE YELLOW  LABSPEC 1.019  PHURINE 6.0  GLUCOSEU NEGATIVE  HGBUR NEGATIVE  BILIRUBINUR NEGATIVE  KETONESUR NEGATIVE  PROTEINUR NEGATIVE  UROBILINOGEN 1.0  NITRITE NEGATIVE  LEUKOCYTESUR TRACE*   Lipid Panel    Component Value Date/Time   CHOL 196 11/27/2013 0521   TRIG 89 11/27/2013 0521   HDL 43 11/27/2013 0521   CHOLHDL 4.6 11/27/2013 0521   VLDL 18 11/27/2013 0521   LDLCALC 135* 11/27/2013 0521   HgbA1C  Lab Results  Component Value Date   HGBA1C 7.4* 11/27/2013    Urine Drug Screen:     Component Value Date/Time   LABOPIA NONE DETECTED 11/26/2013 1757   COCAINSCRNUR NONE DETECTED 11/26/2013 1757   LABBENZ NONE DETECTED 11/26/2013 1757   AMPHETMU NONE DETECTED 11/26/2013 1757   THCU NONE DETECTED 11/26/2013 1757   LABBARB NONE DETECTED 11/26/2013 1757  Alcohol Level:   Recent Labs Lab 11/26/13 [place] <11     MRI of the brain  11/27/2013 Multifocal areas of restricted diffusion affect the right hemisphere consistent with acute infarction. There is no associated hemorrhage. Mild atrophy and small vessel disease.   MRA of the brain 11/27/2013 Suspected acute thrombosis of one or more right middle cerebral artery M3 vessels. Intracranial atherosclerotic change is seen more proximally in a right MCA M2 branch.  2D Echocardiogram EF 55-60% with no source of embolus.   Carotid Doppler  No evidence of hemodynamically significant internal carotid artery stenosis. Vertebral artery flow is antegrade.   CXR  11/27/2013 No evidence of acute cardiopulmonary disease.  EKG  normal sinus rhythm   Therapy Recommendations CIR  Physical Exam   Mental Status:  Patient is awake, alert, oriented to person, place, month, year, and situation.  Immediate  and remote memory are intact.  Patient is able to give a clear and coherent history.  No signs of aphasia or neglect  Cranial Nerves:  II: Visual Fields are full. Pupils are equal, round, and reactive to light.  III,IV, VI: EOMI without ptosis or diploplia.  V: Facial sensation is symmetric to temperature  VII: Facial movement is notable for droop on left  VIII: hearing is intact to voice  X: Uvula elevates symmetrically  XI: Shoulder shrug is symmetric.  XII: tongue is midline without atrophy or fasciculations.  Motor:  Tone is normal. Bulk is normal. 5/5 strength was present on the right side, on the left he has a distal much greater than proximal weakness of the left upper extremity and possible very mild weakness of the left lower extremity(5-/5).  Sensory:  Sensation is symmetric to light touch and temperature in the arms and legs.  Deep Tendon Reflexes:  2+ and symmetric in the biceps and patellae.  Plantars:  Toes are downgoing bilaterally.    ASSESSMENT [name] is a 73 y.o. male presenting with left sided facial droop and LUE weakness. No TPA given as he is outside the window. MRI confirmed right hemispheric infarcts, felt to be embolic due to unknown source. TEE without source of embolus, though suboptimal as bubble study not performed secondary to respiratory issues, though bedside injection x 3 was performed (neg). Loop recorder placed. On no antithrombotics prior to admission. Now on ASA 325mg  daily for secondary stroke prevention. Patient with resultant left hemiparesis, IP rehab planned. Stroke work up completed.  Accelerated hypertension Hyperlipidemia, LDL 135, on no statin PTA, now on Lipitor 80mg  daily, goal LDL < 70 for diabetics  Diabetes, HgbA1c 7.4, goal < 7.0  Cigarette smoker  Hx right leg DVT 2005, no etiology known  Hospital day # 4  TREATMENT/PLAN  Continue ASA 325mg  daily for secondary stroke prevention  Anticipate d/c to IP Rehab  today  Ongoing Risk factor management  Device clinic will follow up loop recorder monitoring  No further stroke workup indicated. Patient has a 10-15% risk of having another stroke over the next year, the highest risk is within 2 weeks of the most recent stroke/TIA (risk of having a stroke following a stroke or TIA is the same). Ongoing risk factor control by Primary Care Physician Stroke Service will sign off. Please call should any needs arise. Follow up with Dr. Leonie Man, [facility], in 2 months.  Burnetta Sabin, MSN, RN, ANVP-BC, ANP-BC, GNP-BC [facility] Pager: [phone] 11/30/2013 8:55 AM  I have personally obtained a history, examined  the patient, evaluated imaging results, and formulated the assessment and plan of care. I agree with the above.  Antony Contras, MD    To contact Stroke Continuity provider, please refer to [email or web address] After hours, contact General Neurology

## 2013-11-30 NOTE — Discharge Summary (Signed)
Physician Discharge Summary  Scott Conley YTK:[id number] DOB: May 12, 1941 DOA: 11/26/2013  PCP: No primary provider on file.  Admit date: 11/26/2013 Discharge date: 11/30/2013  Time spent: 25 minutes  Recommendations for Outpatient Follow-up:  1. Needs diabetic teaching-new diabetic-would increase metformin to 500 bid in 1 weel 2. Needs Loop recorder device follow up 2 wk-1 mo 3. Cont ASA 325 daily 4. Check lipid panel in 3 months-started Lipitor 80 this admit 5. Needs CIR level care per PT/OT    Discharge Diagnoses:  Active Problems:   Stroke   Discharge Condition: Good  Diet recommendation: heart healthy diabetic  Filed Weights   11/28/13 0400 11/29/13 0400 11/30/13 0400  Weight: 108.546 kg (239 lb 4.8 oz) 109.453 kg (241 lb 4.8 oz) 109.453 kg (241 lb 4.8 oz)    History of present illness:  Brief narrative:  73 y/o ?, no significant pmh admitted 11/26/13 with sudden L sdied [employer], Slurrred speech, accelerated HTN. He has poor health literacy of at baseline and has not followed with a doctor for many years    Past medical history-As per Problem list  Chart reviewed as below-  reviewed  Consultants:  Neurology Cardioloy Procedures:  CT head  Echocardiogram pending  Carotid Doppler shows 1-39% bilateral stenosis only  MRI 5/3 -shows multifocal areas of acute infarct right hemisphere, thrombosis right middle cerebral artery TEE 5/5 Loop recorder placed 5/5  Antibiotics:  None  Hospital Course:   1. Right MCA infarcts resulting in face hand greater than leg weakness. On stroke pathway--work up completed although TEE suboptimal, bubble study neg by saline injection. Had loop recorder 11/30/13--needs this follwed in OP setting by device clinic. Likely d/c to CIR in am 2. Frothy blood tinged sputum-no symptoms now. ?Anesthesia/procedure related. Monitor overnight 3. Accelerated hypertension-patient was allowed permissiove hypertension 5/1--uptitrate Coreg to  6.25 4. Hyperlipidemia-LDL 135. Secondary prevention will start intensity statin atorvastatin 80 mg daily 5. Diabetes, A1c 7.4-started metformin 500 mg- Doesn't qualify for insulin-recommend uptitration in 1 week to 500 po bid. Get dietary education as OP   Discharge Exam: Filed Vitals:   11/30/13 0731  BP: 139/92  Pulse: 76  Temp: 97.8 F (36.6 C)  Resp: 17    General: EOMI, NCAt  Cardiovascular: s1 s2 no m/r/g Respiratory: Clear no added sound Neuro intact wekaer L side but stronger  Discharge Instructions You were cared for by a hospitalist during your hospital stay. If you have any questions about your discharge medications or the care you received while you were in the hospital after you are discharged, you can call the unit and asked to speak with the hospitalist on call if the hospitalist that took care of you is not available. Once you are discharged, your primary care physician will handle any further medical issues. Please note that NO REFILLS for any discharge medications will be authorized once you are discharged, as it is imperative that you return to your primary care physician (or establish a relationship with a primary care physician if you do not have one) for your aftercare needs so that they can reassess your need for medications and monitor your lab values.  Discharge Orders   Future Appointments Provider Department Dept Phone   12/13/2013 10:30 AM [facility] Office [phone]   Future Orders Complete By Expires   Ambulatory referral to Nutrition and Diabetic Education  As directed    Diet - low sodium heart healthy  As directed    Increase  activity slowly  As directed        Medication List         aspirin 325 MG tablet  Take 1 tablet (325 mg total) by mouth daily.     atorvastatin 80 MG tablet  Commonly known as:  LIPITOR  Take 1 tablet (80 mg total) by mouth daily at 6 PM.     carvedilol 6.25 MG tablet  Commonly  known as:  COREG  Take 1 tablet (6.25 mg total) by mouth 2 (two) times daily with a meal.     diphenhydrAMINE 25 mg capsule  Commonly known as:  BENADRYL  Take 25 mg by mouth at bedtime as needed for sleep.     metFORMIN 500 MG tablet  Commonly known as:  GLUCOPHAGE  Take 1 tablet (500 mg total) by mouth daily with breakfast.       No Known Allergies     Follow-up Information   Follow up with Forbes Cellar, MD. Schedule an appointment as soon as possible for a visit in 2 months. (Stroke Clinic)    Specialties:  Neurology, Radiology   Contact information:   [address] [place] [address] [phone]        The results of significant diagnostics from this hospitalization (including imaging, microbiology, ancillary and laboratory) are listed below for reference.    Significant Diagnostic Studies: Dg Chest 2 View  11/27/2013   CLINICAL DATA:  Left-sided weakness, stroke  EXAM: CHEST  2 VIEW  COMPARISON:  None.  FINDINGS: Lungs are clear.  No pleural effusion or pneumothorax.  The heart is normal in size.  Mild degenerative changes of the visualized thoracolumbar spine.  IMPRESSION: No evidence of acute cardiopulmonary disease.   Electronically Signed   By: Julian Hy M.D.   On: 11/27/2013 19:14   Ct Head Wo Contrast  11/26/2013   CLINICAL DATA:  73 year old with left-sided arm and leg weakness for 1 day.  EXAM: CT HEAD WITHOUT CONTRAST  TECHNIQUE: Contiguous axial images were obtained from the base of the skull through the vertex without intravenous contrast.  COMPARISON:  None.  FINDINGS: There is a small high right frontoparietal cortical infarct which is age-indeterminate.  Mild chronic small-vessel white matter ischemic changes are present.  There is no evidence of mass lesion or mass effect, hydrocephalus, extra-axial fluid collection, midline shift or hemorrhage.  The visualized bony calvarium is unremarkable.  IMPRESSION: Small age-indeterminate high  right frontoparietal cortical infarct - no evidence of hemorrhage.  Chronic small-vessel white matter ischemic changes.   Electronically Signed   By: Hassan Rowan M.D.   On: 11/26/2013 18:45   Mr Brain Wo Contrast  11/27/2013   CLINICAL DATA:  Patient awoke with left-sided facial droop and arm weakness.  EXAM: MRI HEAD WITHOUT CONTRAST  MRA HEAD WITHOUT CONTRAST  TECHNIQUE: Multiplanar, multiecho pulse sequences of the brain and surrounding structures were obtained without intravenous contrast. Angiographic images of the head were obtained using MRA technique without contrast.  COMPARISON:  CT HEAD W/O CM dated 11/26/2013  FINDINGS: MRI HEAD FINDINGS  Multifocal areas of restricted diffusion affect the right posterior frontal, anterior parietal, and superior temporal lobe largely punctate and subcentimeter but some confluent consistent with an acute right MCA territory insult. There is no hemorrhage, mass lesion, hydrocephalus, or extra-axial fluid.  Mild cerebral and cerebellar atrophy. Mild subcortical and periventricular T2 and FLAIR hyperintensities, likely chronic microvascular ischemic change. Flow voids are maintained throughout the carotid, basilar, and vertebral  arteries. There are no areas of chronic hemorrhage. Pituitary, pineal, and cerebellar tonsils unremarkable. No upper cervical lesions. Visualized calvarium, skull base, and upper cervical osseous structures unremarkable. Scalp and extracranial soft tissues, orbits, sinuses, and mastoids show no acute process.  MRA HEAD FINDINGS  Mild non stenotic irregularity of the cavernous and supra clinoid internal carotid arteries without flow-limiting stenosis. Basilar artery widely patent. Right vertebral dominant. Left vertebral diminutive, primarily supplying PICA. No proximal flow limiting stenosis of the anterior, middle, or posterior cerebral arteries. No cerebellar branch occlusion. No visible aneurysm.  There are a reduced number of right middle cerebral  artery M3 vessels consistent with one or more MCA branch occlusions. There is a severe stenosis of a proximal M2 branch on the right; it is unclear if the right MCA is a trifurcation or bifurcation.  IMPRESSION: Multifocal areas of restricted diffusion affect the right hemisphere consistent with acute infarction. There is no associated hemorrhage.  Mild atrophy and small vessel disease.  Suspected acute thrombosis of one or more right middle cerebral artery M3 vessels. Intracranial atherosclerotic change is seen more proximally in a right MCA M2 branch.   Electronically Signed   By: Rolla Flatten M.D.   On: 11/27/2013 21:27   Mr Jodene Nam Head/brain Wo Cm  11/27/2013   CLINICAL DATA:  Patient awoke with left-sided facial droop and arm weakness.  EXAM: MRI HEAD WITHOUT CONTRAST  MRA HEAD WITHOUT CONTRAST  TECHNIQUE: Multiplanar, multiecho pulse sequences of the brain and surrounding structures were obtained without intravenous contrast. Angiographic images of the head were obtained using MRA technique without contrast.  COMPARISON:  CT HEAD W/O CM dated 11/26/2013  FINDINGS: MRI HEAD FINDINGS  Multifocal areas of restricted diffusion affect the right posterior frontal, anterior parietal, and superior temporal lobe largely punctate and subcentimeter but some confluent consistent with an acute right MCA territory insult. There is no hemorrhage, mass lesion, hydrocephalus, or extra-axial fluid.  Mild cerebral and cerebellar atrophy. Mild subcortical and periventricular T2 and FLAIR hyperintensities, likely chronic microvascular ischemic change. Flow voids are maintained throughout the carotid, basilar, and vertebral arteries. There are no areas of chronic hemorrhage. Pituitary, pineal, and cerebellar tonsils unremarkable. No upper cervical lesions. Visualized calvarium, skull base, and upper cervical osseous structures unremarkable. Scalp and extracranial soft tissues, orbits, sinuses, and mastoids show no acute process.  MRA  HEAD FINDINGS  Mild non stenotic irregularity of the cavernous and supra clinoid internal carotid arteries without flow-limiting stenosis. Basilar artery widely patent. Right vertebral dominant. Left vertebral diminutive, primarily supplying PICA. No proximal flow limiting stenosis of the anterior, middle, or posterior cerebral arteries. No cerebellar branch occlusion. No visible aneurysm.  There are a reduced number of right middle cerebral artery M3 vessels consistent with one or more MCA branch occlusions. There is a severe stenosis of a proximal M2 branch on the right; it is unclear if the right MCA is a trifurcation or bifurcation.  IMPRESSION: Multifocal areas of restricted diffusion affect the right hemisphere consistent with acute infarction. There is no associated hemorrhage.  Mild atrophy and small vessel disease.  Suspected acute thrombosis of one or more right middle cerebral artery M3 vessels. Intracranial atherosclerotic change is seen more proximally in a right MCA M2 branch.   Electronically Signed   By: Rolla Flatten M.D.   On: 11/27/2013 21:27    Microbiology: No results found for this or any previous visit (from the past 240 hour(s)).   Labs: Basic Metabolic Panel:  Recent Labs Lab  11/26/13 1730 11/26/13 1738 11/26/13 2050 11/28/13 0354  NA 138 137 140 138  K 5.3 6.6* 3.9 3.7  CL 101 106 102 100  CO2 21  --  22 20  GLUCOSE 129* 128* 117* 105*  BUN 10 11 9 14   CREATININE 0.85 1.00 0.89 0.87  CALCIUM 9.5  --  9.4 9.3   Liver Function Tests:  Recent Labs Lab 11/26/13 1730 11/28/13 0354  AST 25 26  ALT 18 16  ALKPHOS 82 74  BILITOT 0.6 0.8  PROT 8.3 7.7  ALBUMIN 3.9 3.6   No results found for this basename: LIPASE, AMYLASE,  in the last 168 hours No results found for this basename: AMMONIA,  in the last 168 hours CBC:  Recent Labs Lab 11/26/13 1730 11/26/13 1738  WBC 8.6  --   NEUTROABS 6.0  --   HGB 14.5 16.3  HCT 43.7 48.0  MCV 79.5  --   PLT 327  --     Cardiac Enzymes: No results found for this basename: CKTOTAL, CKMB, CKMBINDEX, TROPONINI,  in the last 168 hours BNP: BNP (last 3 results) No results found for this basename: PROBNP,  in the last 8760 hours CBG: No results found for this basename: GLUCAP,  in the last 168 hours     Signed:  Nita Sells  [facility] 11/30/2013, 11:23 AM

## 2013-11-30 NOTE — Progress Notes (Signed)
Patient information reviewed and entered into eRehab system by Charley Lafrance, RN, CRRN, PPS Coordinator.  Information including medical coding and functional independence measure will be reviewed and updated through discharge.     Per nursing patient was given "Data Collection Information Summary for Patients in Inpatient Rehabilitation Facilities with attached "Privacy Act Statement-Health Care Records" upon admission.  

## 2013-11-30 NOTE — H&P (Signed)
Physical Medicine and Rehabilitation Admission H&P  Chief Complaint   Patient presents with   .  Extremity Weakness   :  Chief complaint: Weakness  HPI: Scott Conley is a 73 y.o. right-handed male on no scheduled medications. Independent prior to admission living alone. Presented 11/26/2013 with left-sided weakness and slurred speech. Noted elevated systolic blood pressure in the 200s as well as findings of potassium 6.6. MRI of the brain shows right hemispheric acute infarct. MRA of the head with atherosclerotic type changes. Echocardiogram with ejection fraction of 60% no wall motion abnormalities. Carotid Dopplers with no ICA stenosis. Patient did not receive TPA. Coreg was added for hypertension control. TEE 11/29/2013 had to be aborted before bubble study due to increased respiratory secretions with severe cough. Bedside saline contrast study performed which showed negative for interatrial shunt or embolism. Loop recorder placed 11/29/2013 per Dr. Caryl Comes. Neurology followup maintain on aspirin for CVA prophylaxis as well as subcutaneous Lovenox for DVT prophylaxis.  Elevated hemoglobin A1c of 7.4 Glucophage initiated. Physical therapy evaluation completed 11/27/2013 with recommendations of physical medicine rehabilitation consult. Patient was admitted for comprehensive rehabilitation program  ROS Review of Systems  Musculoskeletal: Positive for joint pain and myalgias.  Neurological: Positive for speech change.  All other systems reviewed and are negative  Past Medical History   Diagnosis  Date   .  Blood clot in vein    .  Arthritis    .  Diabetes    .  CVA (cerebral infarction)    .  Hypertension     Past Surgical History   Procedure  Laterality  Date   .  Cyst removal leg       removed from right groin    History reviewed. No pertinent family history.  Social History: reports that he has been smoking Cigarettes. He has a 29 pack-year smoking history. He has never used smokeless  tobacco. He reports that he does not drink alcohol or use illicit drugs.  Allergies: No Known Allergies  Medications Prior to Admission   Medication  Sig  Dispense  Refill   .  diphenhydrAMINE (BENADRYL) 25 mg capsule  Take 25 mg by mouth at bedtime as needed for sleep.      Home:  Home Living  Family/patient expects to be discharged to:: Private residence  Living Arrangements: Alone  Available Help at Discharge: Family;Available PRN/intermittently  Type of Home: Apartment  Home Access: Level entry  Home Layout: One level  Home Equipment: None  Lives With: Alone  Functional History:  Prior Function  Level of Independence: Independent  Functional Status:  Mobility:  Bed Mobility  Overal bed mobility: Needs Assistance  Bed Mobility: Supine to Sit  Supine to sit: Min guard;HOB elevated  General bed mobility comments: increased time, definite use of UEs  Transfers  Overall transfer level: Needs assistance  Equipment used: 1 person hand held assist  Transfers: Sit to/from Stand  Sit to Stand: Min guard  General transfer comment: cues for hand placement & technique. Performed 5x's for strengthening, technique, & activiy tolerance.  Ambulation/Gait  Ambulation/Gait assistance: Min guard;Min assist  Ambulation Distance (Feet): 150 Feet  Assistive device: Rolling walker (2 wheeled);None  Gait Pattern/deviations: Step-through pattern;Decreased stride length  Gait velocity: slow  General Gait Details: ocassional min (A) with turns due to mild unsteadiness. Cues for safe RW management & body positioning inside RW.   ADL: min assist for basic adls  Cognition:  Cognition  Overall Cognitive Status: Within Functional  Limits for tasks assessed  Arousal/Alertness: Awake/alert  Orientation Level: Oriented X4  Memory: Appears intact  Awareness: Appears intact  Problem Solving: Appears intact  Safety/Judgment: Appears intact  Cognition  Arousal/Alertness: Awake/alert  Behavior During  Therapy: WFL for tasks assessed/performed  Overall Cognitive Status: Within Functional Limits for tasks assessed    Physical Exam:  Blood pressure 140/73, pulse 83, temperature 97.4 F (36.3 C), temperature source Oral, resp. rate 18, height 6' 4" (1.93 m), weight 109.453 kg (241 lb 4.8 oz), SpO2 99.00%.     HENT: slight thrush on the tongue Head: Normocephalic.  Eyes: EOM are normal. PERRL Neck: Normal range of motion. Neck supple. No thyromegaly present.  Cardiovascular: Normal rate and regular rhythm. No murmurs Respiratory: Effort normal and breath sounds normal. No respiratory distress. No wheezes GI: Soft. Bowel sounds are normal. He exhibits no distension.  Neurological: He is alert and oriented to person, place, and time.  Follows three-step commands. Mild left central 7.  motor strength is 5/5 in the right deltoid, bicep, tricep, grip, hip flexor, knee extensor, and ankle dorsiflexor plantar flexor  3+/5 in the left deltoid, bicep, tricep, grip, 4/5 in the left hip flexor, knee extensors, ankle dorsiflexion plantar flexor  1/2 sensation LUE to LT, 1+ LLE, normal sensation right hand. Senses pain in all 4's. DTR's 1+. No resting tone. Has good insight, awareness, memory. Conversationally appropriate. Skin: intact without breakdown, loop recorder intact on chest without drainage outside dressing Psych: pt pleasant and in good spirits, cooperative    Results for orders placed during the hospital encounter of 11/26/13 (from the past 48 hour(s))   COMPREHENSIVE METABOLIC PANEL Status: Abnormal    Collection Time    11/28/13 3:54 AM   Result  Value  Ref Range    Sodium  138  137 - 147 mEq/L    Potassium  3.7  3.7 - 5.3 mEq/L    Chloride  100  96 - 112 mEq/L    CO2  20  19 - 32 mEq/L    Glucose, Bld  105 (*)  70 - 99 mg/dL    BUN  14  6 - 23 mg/dL    Creatinine, Ser  0.87  0.50 - 1.35 mg/dL    Calcium  9.3  8.4 - 10.5 mg/dL    Total Protein  7.7  6.0 - 8.3 g/dL    Albumin  3.6   3.5 - 5.2 g/dL    AST  26  0 - 37 U/L    ALT  16  0 - 53 U/L    Alkaline Phosphatase  74  39 - 117 U/L    Total Bilirubin  0.8  0.3 - 1.2 mg/dL    GFR calc non Af Amer  83 (*)  >90 mL/min    GFR calc Af Amer  >90  >90 mL/min    Comment:  (NOTE)     The eGFR has been calculated using the CKD EPI equation.     This calculation has not been validated in all clinical situations.     eGFR's persistently <90 mL/min signify possible Chronic Kidney     Disease.    Dg Chest 2 View  11/27/2013 CLINICAL DATA: Left-sided weakness, stroke EXAM: CHEST 2 VIEW COMPARISON: None. FINDINGS: Lungs are clear. No pleural effusion or pneumothorax. The heart is normal in size. Mild degenerative changes of the visualized thoracolumbar spine. IMPRESSION: No evidence of acute cardiopulmonary disease. Electronically Signed By: Sriyesh Krishnan M.D. On: 11/27/2013   19:14  Mr Brain Wo Contrast  11/27/2013 CLINICAL DATA: Patient awoke with left-sided facial droop and arm weakness. EXAM: MRI HEAD WITHOUT CONTRAST MRA HEAD WITHOUT CONTRAST TECHNIQUE: Multiplanar, multiecho pulse sequences of the brain and surrounding structures were obtained without intravenous contrast. Angiographic images of the head were obtained using MRA technique without contrast. COMPARISON: CT HEAD W/O CM dated 11/26/2013 FINDINGS: MRI HEAD FINDINGS Multifocal areas of restricted diffusion affect the right posterior frontal, anterior parietal, and superior temporal lobe largely punctate and subcentimeter but some confluent consistent with an acute right MCA territory insult. There is no hemorrhage, mass lesion, hydrocephalus, or extra-axial fluid. Mild cerebral and cerebellar atrophy. Mild subcortical and periventricular T2 and FLAIR hyperintensities, likely chronic microvascular ischemic change. Flow voids are maintained throughout the carotid, basilar, and vertebral arteries. There are no areas of chronic hemorrhage. Pituitary, pineal, and cerebellar tonsils  unremarkable. No upper cervical lesions. Visualized calvarium, skull base, and upper cervical osseous structures unremarkable. Scalp and extracranial soft tissues, orbits, sinuses, and mastoids show no acute process. MRA HEAD FINDINGS Mild non stenotic irregularity of the cavernous and supra clinoid internal carotid arteries without flow-limiting stenosis. Basilar artery widely patent. Right vertebral dominant. Left vertebral diminutive, primarily supplying PICA. No proximal flow limiting stenosis of the anterior, middle, or posterior cerebral arteries. No cerebellar branch occlusion. No visible aneurysm. There are a reduced number of right middle cerebral artery M3 vessels consistent with one or more MCA branch occlusions. There is a severe stenosis of a proximal M2 branch on the right; it is unclear if the right MCA is a trifurcation or bifurcation. IMPRESSION: Multifocal areas of restricted diffusion affect the right hemisphere consistent with acute infarction. There is no associated hemorrhage. Mild atrophy and small vessel disease. Suspected acute thrombosis of one or more right middle cerebral artery M3 vessels. Intracranial atherosclerotic change is seen more proximally in a right MCA M2 branch. Electronically Signed By: Rolla Flatten M.D. On: 11/27/2013 21:27  Mr Jodene Nam Head/brain Wo Cm  11/27/2013 CLINICAL DATA: Patient awoke with left-sided facial droop and arm weakness. EXAM: MRI HEAD WITHOUT CONTRAST MRA HEAD WITHOUT CONTRAST TECHNIQUE: Multiplanar, multiecho pulse sequences of the brain and surrounding structures were obtained without intravenous contrast. Angiographic images of the head were obtained using MRA technique without contrast. COMPARISON: CT HEAD W/O CM dated 11/26/2013 FINDINGS: MRI HEAD FINDINGS Multifocal areas of restricted diffusion affect the right posterior frontal, anterior parietal, and superior temporal lobe largely punctate and subcentimeter but some confluent consistent with an acute  right MCA territory insult. There is no hemorrhage, mass lesion, hydrocephalus, or extra-axial fluid. Mild cerebral and cerebellar atrophy. Mild subcortical and periventricular T2 and FLAIR hyperintensities, likely chronic microvascular ischemic change. Flow voids are maintained throughout the carotid, basilar, and vertebral arteries. There are no areas of chronic hemorrhage. Pituitary, pineal, and cerebellar tonsils unremarkable. No upper cervical lesions. Visualized calvarium, skull base, and upper cervical osseous structures unremarkable. Scalp and extracranial soft tissues, orbits, sinuses, and mastoids show no acute process. MRA HEAD FINDINGS Mild non stenotic irregularity of the cavernous and supra clinoid internal carotid arteries without flow-limiting stenosis. Basilar artery widely patent. Right vertebral dominant. Left vertebral diminutive, primarily supplying PICA. No proximal flow limiting stenosis of the anterior, middle, or posterior cerebral arteries. No cerebellar branch occlusion. No visible aneurysm. There are a reduced number of right middle cerebral artery M3 vessels consistent with one or more MCA branch occlusions. There is a severe stenosis of a proximal M2 branch on the right; it is  unclear if the right MCA is a trifurcation or bifurcation. IMPRESSION: Multifocal areas of restricted diffusion affect the right hemisphere consistent with acute infarction. There is no associated hemorrhage. Mild atrophy and small vessel disease. Suspected acute thrombosis of one or more right middle cerebral artery M3 vessels. Intracranial atherosclerotic change is seen more proximally in a right MCA M2 branch. Electronically Signed By: John Curnes M.D. On: 11/27/2013 21:27   Medical Problem List and Plan:  1. Functional deficits secondary to right MCA infarct felt to be embolic.  2. DVT Prophylaxis/Anticoagulation: Subcutaneous Lovenox. Monitor platelet counts and any signs of bleeding  3. Pain Management:  Tylenol as needed  5. Neuropsych: This patient is capable of making decisions on his own behalf.  6. Hypertension. Coreg 6.25 mg twice a day. Monitor with increased mobility and adjust appropriately. Has had high readings over last 24 hours 7. Newly diagnosed diabetes mellitus. Hemoglobin A1c 7.4. Glucophage 500 mg daily. Provide diabetic teaching. Check blood sugars a.c. and at bedtime. Adjust regimen as needed for tighter control 8. Hyperlipidemia. Lipitor   Post Admission Physician Evaluation:  1. Functional deficits secondary to embolic right MCA infarct. 2. Patient is admitted to receive collaborative, interdisciplinary care between the physiatrist, rehab nursing staff, and therapy team. 3. Patient's level of medical complexity and substantial therapy needs in context of that medical necessity cannot be provided at a lesser intensity of care such as a SNF. 4. Patient has experienced substantial functional loss from his/her baseline which was documented above under the "Functional History" and "Functional Status" headings. Judging by the patient's diagnosis, physical exam, and functional history, the patient has potential for functional progress which will result in measurable gains while on inpatient rehab. These gains will be of substantial and practical use upon discharge in facilitating mobility and self-care at the household level. 5. Physiatrist will provide 24 hour management of medical needs as well as oversight of the therapy plan/treatment and provide guidance as appropriate regarding the interaction of the two. 6. 24 hour rehab nursing will assist with bladder management, bowel management, safety, skin/wound care, disease management, medication administration, pain management and patient education and help integrate therapy concepts, techniques,education, etc. 7. PT will assess and treat for/with: Lower extremity strength, range of motion, stamina, balance, functional mobility, safety,  adaptive techniques and equipment, NMR, visual-spatial awareness, stroke education, BP monitoring, family ed. Goals are: mod I. 8. OT will assess and treat for/with: ADL's, functional mobility, safety, upper extremity strength, adaptive techniques and equipment, NMR, visual-spatial awareness, bp monitoring, education. Goals are: mod I. 9. SLP will assess and treat for/with: speech intelligibility, communication. Goals are: mod I. 10. Case Management and Social Worker will assess and treat for psychological issues and discharge planning. 11. Team conference will be held weekly to assess progress toward goals and to determine barriers to discharge. 12. Patient will receive at least 3 hours of therapy per day at least 5 days per week. 13. ELOS: 7-9 days  14. Prognosis: excellent   Zachary T. Swartz, MD, FAAPMR [facility]   11/29/2013  

## 2013-11-30 NOTE — Progress Notes (Signed)
Inpatient Diabetes Program Recommendations  AACE/ADA: New Consensus Statement on Inpatient Glycemic Control (2013)  Target Ranges:  Prepandial:   less than 140 mg/dL      Peak postprandial:   less than 180 mg/dL (1-2 hours)      Critically ill patients:  140 - 180 mg/dL     New diagnosis of DM this admission.  Note plans to send patient to Inpatient Rehab for further care.  DM Coordinator and RD spoke with patient yesterday.  RNs to provide ongoing DM education with patient at the bedside.    MD- Please place order for CBGs checks tid ac + HS and Add Novolog Sensitive SSI    Will follow Wyn Quaker RN, MSN, CDE Diabetes Coordinator Inpatient Diabetes Program Team Pager: [phone] (8a-10p)

## 2013-11-30 NOTE — Progress Notes (Signed)
I have contacted Dr. Verlon Au to clarify if pt medically ready to d/c to inpt rehab today. Pt in chair and agreeable to admit today. [phone]

## 2013-11-30 NOTE — Progress Notes (Signed)
PMR Admission Coordinator Pre-Admission Assessment   Patient: Scott Conley is an 73 y.o., male MRN: [id number] DOB: [birth year] Height: 6\' 4"  (193 cm) Weight: 109.453 kg (241 lb 4.8 oz)                                                                                                                                                  Insurance Information HMO:     PPO:      PCP:      IPA:      80/20: yes     OTHER: no HMO PRIMARY: Medicare a and b      Policy#: [id number]      Subscriber: pt Benefits:  Phone #: [email or web address] [email or web address]     Name: 11/29/13 Eff. Date: a 09/26/04 b 06/28/06     Deduct: $1260      Out of Pocket Max: none      Life Max: none CIR: 100%      SNF: 20 full days Outpatient: 80%     Co-Pay: 20% Home Health: 100%      Co-Pay: none DME: 80%     Co-Pay: 20% Providers: pt choice  SECONDARY: none       Medicaid Application Date:       Case Manager:  Disability Application Date:       Case Worker:    Emergency Contact Information Contact Information     Name  Relation  Home  Work  Mobile     [employer]  Daughter      [place]    [phone]         [name]        [phone]       Current Medical History  Patient Admitting Diagnosis: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 right-handed male on no scheduled medications. Presented 11/26/2013 with left-sided weakness and slurred speech. Noted elevated systolic blood pressure in the 200s as well as findings of potassium 6.6. MRI of the brain shows right hemispheric acute infarct. MRA of the head with atherosclerotic type changes. Echocardiogram with ejection fraction of 60% no wall motion abnormalities. Carotid Dopplers with no ICA stenosis. Patient did not receive TPA. Coreg was added for hypertension control. TEE is pending and possible LOOP recorder for 11/29/13. Neurology followup maintain on aspirin for CVA prophylaxis as well as  subcutaneous Lovenox for DVT prophylaxis.   Elevated hemoglobin A1c of 7.4 Glucophage initiated. New diagnosis for DM per pt.   Cardiology consulted for pt on telemetry which has demonstrated no arrhythmias after cryptogenic stroke. No cause identified. TEE 11/27/13. EP placed an implantable loop recorder to monitor for atrial fibrillation on 11/29/13.   Patient is retired he lives alone but has a daughter that can help  him post discharge. He was independent prior to admission     Total: 5  NIHSS   Past Medical History  Past Medical History   Diagnosis  Date   .  Blood clot in vein     .  Arthritis     .  Diabetes     .  CVA (cerebral infarction)     .  Hypertension        Family History  family history is not on file.   Prior Rehab/Hospitalizations: none   Current Medications  Current facility-administered medications:aspirin tablet 325 mg, 325 mg, Oral, Daily, [name], MD, 325 mg at 11/30/13 4970;  atorvastatin (LIPITOR) tablet 80 mg, 80 mg, Oral, q1800, Nita Sells, MD, 80 mg at 11/29/13 1712;  carvedilol (COREG) tablet 6.25 mg, 6.25 mg, Oral, BID WC, Nita Sells, MD, 6.25 mg at 11/30/13 0952 enoxaparin (LOVENOX) injection 55 mg, 55 mg, Subcutaneous, Q24H, Darnell Level Mancheril, RPH, 55 mg at 11/30/13 2637;  hydrALAZINE (APRESOLINE) injection 10 mg, 10 mg, Intravenous, Q6H PRN, Nita Sells, MD;  metFORMIN (GLUCOPHAGE) tablet 500 mg, 500 mg, Oral, Q breakfast, Nita Sells, MD, 500 mg at 11/30/13 8588   Patients Current Diet: Carb Control for TEE 11/29/12. 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 Retired for 8 years; eats out Company secretary   Current Functional Level Cognition   Arousal/Alertness: Awake/alert Overall Cognitive Status: Within Functional Limits for tasks assessed Orientation Level: Oriented X4 Memory: Appears intact Awareness: Appears intact Problem Solving: Appears intact Safety/Judgment: Appears intact     Extremity Assessment (includes Sensation/Coordination)          ADLs   Overall ADL's : Needs assistance/impaired Grooming: Wash/dry hands;Wash/dry face Upper Body Bathing: Minimal assitance Lower Body Bathing: Moderate assistance Upper Body Dressing : Minimal assistance Lower Body Dressing: Moderate assistance Toilet Transfer: Minimal assistance Functional mobility during ADLs: Minimal assistance General ADL Comments: Difficulty using left hand functionally, weak grasp. de;ayed release.  Proximal weakness.  Decreased balance, limited sensation LLE      Mobility   Overal bed mobility: Needs Assistance Bed Mobility: Supine to Sit Supine to sit: Min guard;HOB elevated General bed mobility comments: increased time, definite use of UEs      Transfers   Overall transfer level: Needs assistance Equipment used: 1 person hand held assist Transfers: Sit to/from Stand Sit to Stand: Min guard General transfer comment: cues for hand placement & technique.  Performed 5x's for strengthening, technique, & activiy tolerance.       Ambulation / Gait / Stairs / Wheelchair Mobility   Ambulation/Gait Ambulation/Gait assistance: Min guard;Min Wellsite geologist (Feet): 150 Feet Assistive device: Rolling walker (2 wheeled);None Gait Pattern/deviations: Step-through pattern;Decreased stride length Gait velocity: slow General Gait Details: ocassional min (A) with turns due to mild unsteadiness.  Cues for safe RW management & body positioning inside RW.        Posture / Balance       Special needs/care consideration  Bowel mgmt: continent Bladder mgmt: condom catheter Diabetic mgmt Hgb A1C 7.4 new dx od DM per pt. Will need diet education, monitoring education, med management,  etc.      Previous Home Environment Living Arrangements: Alone  Lives With: Alone Available Help at Discharge: Family;Available 24 hours/day Type of Home: Apartment Home Layout: One level Home Access: Level entry Bathroom Shower/Tub: Chiropodist: Standard Bathroom Accessibility: Yes How Accessible: Accessible via walker Home Care Services: No   Discharge Living Setting Plans for Discharge Living Setting: Patient's home;Apartment;Alone Type of Home at Discharge: Apartment Discharge Home Layout: One level Discharge Home Access: Level entry Discharge Bathroom Shower/Tub: Tub/shower unit Discharge Bathroom Toilet: Standard Discharge Bathroom Accessibility: Yes How Accessible: Accessible via walker Does the patient have any problems obtaining your medications?: No   Social/Family/Support Systems Patient Roles: Parent;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) Contact Information: Arita Miss, his two daughters Anticipated Caregiver: [name] to come for one week after d/c; otherwise [name] prn and two sons prn Anticipated Caregiver's Contact Information: see above Ability/Limitations of Caregiver: [name] lives in [place], [name] and two local sons also work Caregiver Availability: Other (Comment) [name] for one week 24/7 then prn assist) Discharge Plan Discussed with Primary Caregiver: Yes Is Caregiver In Agreement with Plan?: Yes Does Caregiver/Family have Issues with Lodging/Transportation while Pt is in Rehab?: No   Goals/Additional Needs Patient/Family Goal for Rehab: Mod I with PT, OT, and SLP Expected length of stay: ELOS 10 to 12 days Dietary Needs: Carb Modified; new diabetic dx this admission Pt/Family Agrees to Admission  and willing to participate: Yes Program Orientation Provided & Reviewed with Pt/Caregiver Including Roles  & Responsibilities: Yes     Decrease burden of Care through IP rehab admission: n/a   Possible need for SNF placement upon discharge:n/a     Patient Condition: This patient's condition remains as documented in the consult dated 11/29/13, in which the Rehabilitation Physician determined and documented that the patient's condition is appropriate for intensive rehabilitative care in an inpatient rehabilitation facility. Will admit to inpatient rehab today.   Preadmission Screen Completed By:  Cleatrice [place], 11/30/2013 12:00 PM ______________________________________________________________________    Discussed status with Dr. Naaman Plummer on 11/29/13 at  1223 and received telephone approval for admission today. Patient not admitted 11/29/13 due to delay in implantation of Loop recorder after TEE with increased secretions and elevated BP during TEE 11/29/13.    Discussed status with Dr. Naaman Plummer on 11/30/13 at 52 and received telephone approval for admission today.   Admission Coordinator:  Cleatrice Burke, time 1610 Date 11/29/2013. We will plan admit today. 11/30/13 at 1200.         Deleted by: Audelia Acton Evergreen, RN    [11/30/2013 1:37 PM]  Cosigned by: Meredith Staggers, MD    [11/30/2013 12:28 PM]  Revision History...     Date/Time User Action   11/30/2013 12:28 PM Meredith Staggers, MD Cosign   11/30/2013 12:00 PM Cleatrice [place], RN Addend   11/30/2013 10:42 AM Cleatrice [place], RN Incomplete Revision   11/29/2013 1:20 PM Meredith Staggers, MD Cosign   11/29/2013 12:23 PM Cleatrice [place], RN Sign  View Details Report   Chart Correction History...     Date/Time User Action   11/30/2013 1:37 PM Cleatrice [place], RN Delete Data

## 2013-11-30 NOTE — Progress Notes (Signed)
Report called to Scott Conley, [address] [place]. Dr. Tessa Lerner, rehab MD at bedside to assess patient. Spoke with patient's daughter regarding move to other unit. Stable for transfer. Tonita Cong, RN

## 2013-11-30 NOTE — Progress Notes (Signed)
Bilateral lower extremity venous duplex:  No evidence of DVT, superficial thrombosis, or Baker's Cyst.

## 2013-11-30 NOTE — Discharge Summary (Signed)
Physician Discharge Summary  Scott Conley EHO:[id number] DOB: 1941/03/13 DOA: 11/26/2013  PCP: No primary provider on file.  Admit date: 11/26/2013 Discharge date: 11/30/2013  Time spent: 25 minutes  Recommendations for Outpatient Follow-up:   1. No PCP-Needs one assigned  2. Needs diabetic teaching-new diabetic-would increase metformin to 500 bid in 1 weel 3. Needs Loop recorder device follow up 2 wk-1 mo 4. Cont ASA 325 daily 5. Check lipid panel in 3 months-started Lipitor 80 this admit 6. Needs CIR level care per PT/OT    Discharge Diagnoses:  Active Problems:   Stroke   Discharge Condition: Good  Diet recommendation: heart healthy diabetic  Filed Weights   11/28/13 0400 11/29/13 0400 11/30/13 0400  Weight: 108.546 kg (239 lb 4.8 oz) 109.453 kg (241 lb 4.8 oz) 109.453 kg (241 lb 4.8 oz)    History of present illness:  Brief narrative:  73 y/o ?, no significant pmh admitted 11/26/13 with sudden L sdied [employer], Slurrred speech, accelerated HTN. He has poor health literacy of at baseline and has not followed with a doctor for many years    Past medical history-As per Problem list  Chart reviewed as below-  reviewed  Consultants:  Neurology Cardioloy Procedures:  CT head  Echocardiogram pending  Carotid Doppler shows 1-39% bilateral stenosis only  MRI 5/3 -shows multifocal areas of acute infarct right hemisphere, thrombosis right middle cerebral artery TEE 5/5 Loop recorder placed 5/5  Antibiotics:  None  Hospital Course:   1. Right MCA infarcts resulting in face hand greater than leg weakness. On stroke pathway--work up completed although TEE suboptimal, bubble study neg by saline injection. Had loop recorder 11/30/13--needs this follwed in OP setting by device clinic. Likely d/c to CIR in am 2. Frothy blood tinged sputum-no symptoms now. ?Anesthesia/procedure related. Monitor overnight 3. Accelerated hypertension-patient was allowed permissiove hypertension  5/1--uptitrate Coreg to 6.25 4. Hyperlipidemia-LDL 135. Secondary prevention will start intensity statin atorvastatin 80 mg daily 5. Diabetes, A1c 7.4-started metformin 500 mg- Doesn't qualify for insulin-recommend uptitration in 1 week to 500 po bid. Get dietary education as OP   Discharge Exam: Filed Vitals:   11/30/13 0731  BP: 139/92  Pulse: 76  Temp: 97.8 F (36.6 C)  Resp: 17    General: EOMI, NCAt  Cardiovascular: s1 s2 no m/r/g Respiratory: Clear no added sound Neuro intact wekaer L side but stronger  Discharge Instructions You were cared for by a hospitalist during your hospital stay. If you have any questions about your discharge medications or the care you received while you were in the hospital after you are discharged, you can call the unit and asked to speak with the hospitalist on call if the hospitalist that took care of you is not available. Once you are discharged, your primary care physician will handle any further medical issues. Please note that NO REFILLS for any discharge medications will be authorized once you are discharged, as it is imperative that you return to your primary care physician (or establish a relationship with a primary care physician if you do not have one) for your aftercare needs so that they can reassess your need for medications and monitor your lab values.      Discharge Orders   Future Appointments Provider Department Dept Phone   12/13/2013 10:30 AM [facility] Office [phone]   Future Orders Complete By Expires   Ambulatory referral to Nutrition and Diabetic Education  As directed    Diet -  low sodium heart healthy  As directed    Increase activity slowly  As directed        Medication List         aspirin 325 MG tablet  Take 1 tablet (325 mg total) by mouth daily.     atorvastatin 80 MG tablet  Commonly known as:  LIPITOR  Take 1 tablet (80 mg total) by mouth daily at 6 PM.     carvedilol  6.25 MG tablet  Commonly known as:  COREG  Take 1 tablet (6.25 mg total) by mouth 2 (two) times daily with a meal.     diphenhydrAMINE 25 mg capsule  Commonly known as:  BENADRYL  Take 25 mg by mouth at bedtime as needed for sleep.     metFORMIN 500 MG tablet  Commonly known as:  GLUCOPHAGE  Take 1 tablet (500 mg total) by mouth daily with breakfast.       No Known Allergies Follow-up Information   Follow up with Forbes Cellar, MD. Schedule an appointment as soon as possible for a visit in 2 months. (Stroke Clinic)    Specialties:  Neurology, Radiology   Contact information:   [address] [place] [address] [phone]        The results of significant diagnostics from this hospitalization (including imaging, microbiology, ancillary and laboratory) are listed below for reference.    Significant Diagnostic Studies: Dg Chest 2 View  11/27/2013   CLINICAL DATA:  Left-sided weakness, stroke  EXAM: CHEST  2 VIEW  COMPARISON:  None.  FINDINGS: Lungs are clear.  No pleural effusion or pneumothorax.  The heart is normal in size.  Mild degenerative changes of the visualized thoracolumbar spine.  IMPRESSION: No evidence of acute cardiopulmonary disease.   Electronically Signed   By: Julian Hy M.D.   On: 11/27/2013 19:14   Ct Head Wo Contrast  11/26/2013   CLINICAL DATA:  73 year old with left-sided arm and leg weakness for 1 day.  EXAM: CT HEAD WITHOUT CONTRAST  TECHNIQUE: Contiguous axial images were obtained from the base of the skull through the vertex without intravenous contrast.  COMPARISON:  None.  FINDINGS: There is a small high right frontoparietal cortical infarct which is age-indeterminate.  Mild chronic small-vessel white matter ischemic changes are present.  There is no evidence of mass lesion or mass effect, hydrocephalus, extra-axial fluid collection, midline shift or hemorrhage.  The visualized bony calvarium is unremarkable.  IMPRESSION: Small  age-indeterminate high right frontoparietal cortical infarct - no evidence of hemorrhage.  Chronic small-vessel white matter ischemic changes.   Electronically Signed   By: Hassan Rowan M.D.   On: 11/26/2013 18:45   Mr Brain Wo Contrast  11/27/2013   CLINICAL DATA:  Patient awoke with left-sided facial droop and arm weakness.  EXAM: MRI HEAD WITHOUT CONTRAST  MRA HEAD WITHOUT CONTRAST  TECHNIQUE: Multiplanar, multiecho pulse sequences of the brain and surrounding structures were obtained without intravenous contrast. Angiographic images of the head were obtained using MRA technique without contrast.  COMPARISON:  CT HEAD W/O CM dated 11/26/2013  FINDINGS: MRI HEAD FINDINGS  Multifocal areas of restricted diffusion affect the right posterior frontal, anterior parietal, and superior temporal lobe largely punctate and subcentimeter but some confluent consistent with an acute right MCA territory insult. There is no hemorrhage, mass lesion, hydrocephalus, or extra-axial fluid.  Mild cerebral and cerebellar atrophy. Mild subcortical and periventricular T2 and FLAIR hyperintensities, likely chronic microvascular ischemic change. Flow voids are  maintained throughout the carotid, basilar, and vertebral arteries. There are no areas of chronic hemorrhage. Pituitary, pineal, and cerebellar tonsils unremarkable. No upper cervical lesions. Visualized calvarium, skull base, and upper cervical osseous structures unremarkable. Scalp and extracranial soft tissues, orbits, sinuses, and mastoids show no acute process.  MRA HEAD FINDINGS  Mild non stenotic irregularity of the cavernous and supra clinoid internal carotid arteries without flow-limiting stenosis. Basilar artery widely patent. Right vertebral dominant. Left vertebral diminutive, primarily supplying PICA. No proximal flow limiting stenosis of the anterior, middle, or posterior cerebral arteries. No cerebellar branch occlusion. No visible aneurysm.  There are a reduced number of  right middle cerebral artery M3 vessels consistent with one or more MCA branch occlusions. There is a severe stenosis of a proximal M2 branch on the right; it is unclear if the right MCA is a trifurcation or bifurcation.  IMPRESSION: Multifocal areas of restricted diffusion affect the right hemisphere consistent with acute infarction. There is no associated hemorrhage.  Mild atrophy and small vessel disease.  Suspected acute thrombosis of one or more right middle cerebral artery M3 vessels. Intracranial atherosclerotic change is seen more proximally in a right MCA M2 branch.   Electronically Signed   By: Rolla Flatten M.D.   On: 11/27/2013 21:27   Mr Jodene Nam Head/brain Wo Cm  11/27/2013   CLINICAL DATA:  Patient awoke with left-sided facial droop and arm weakness.  EXAM: MRI HEAD WITHOUT CONTRAST  MRA HEAD WITHOUT CONTRAST  TECHNIQUE: Multiplanar, multiecho pulse sequences of the brain and surrounding structures were obtained without intravenous contrast. Angiographic images of the head were obtained using MRA technique without contrast.  COMPARISON:  CT HEAD W/O CM dated 11/26/2013  FINDINGS: MRI HEAD FINDINGS  Multifocal areas of restricted diffusion affect the right posterior frontal, anterior parietal, and superior temporal lobe largely punctate and subcentimeter but some confluent consistent with an acute right MCA territory insult. There is no hemorrhage, mass lesion, hydrocephalus, or extra-axial fluid.  Mild cerebral and cerebellar atrophy. Mild subcortical and periventricular T2 and FLAIR hyperintensities, likely chronic microvascular ischemic change. Flow voids are maintained throughout the carotid, basilar, and vertebral arteries. There are no areas of chronic hemorrhage. Pituitary, pineal, and cerebellar tonsils unremarkable. No upper cervical lesions. Visualized calvarium, skull base, and upper cervical osseous structures unremarkable. Scalp and extracranial soft tissues, orbits, sinuses, and mastoids show  no acute process.  MRA HEAD FINDINGS  Mild non stenotic irregularity of the cavernous and supra clinoid internal carotid arteries without flow-limiting stenosis. Basilar artery widely patent. Right vertebral dominant. Left vertebral diminutive, primarily supplying PICA. No proximal flow limiting stenosis of the anterior, middle, or posterior cerebral arteries. No cerebellar branch occlusion. No visible aneurysm.  There are a reduced number of right middle cerebral artery M3 vessels consistent with one or more MCA branch occlusions. There is a severe stenosis of a proximal M2 branch on the right; it is unclear if the right MCA is a trifurcation or bifurcation.  IMPRESSION: Multifocal areas of restricted diffusion affect the right hemisphere consistent with acute infarction. There is no associated hemorrhage.  Mild atrophy and small vessel disease.  Suspected acute thrombosis of one or more right middle cerebral artery M3 vessels. Intracranial atherosclerotic change is seen more proximally in a right MCA M2 branch.   Electronically Signed   By: Rolla Flatten M.D.   On: 11/27/2013 21:27    Microbiology: No results found for this or any previous visit (from the past 240 hour(s)).   Labs:  Basic Metabolic Panel:  Recent Labs Lab 11/26/13 1730 11/26/13 1738 11/26/13 2050 11/28/13 0354  NA 138 137 140 138  K 5.3 6.6* 3.9 3.7  CL 101 106 102 100  CO2 21  --  22 20  GLUCOSE 129* 128* 117* 105*  BUN 10 11 9 14   CREATININE 0.85 1.00 0.89 0.87  CALCIUM 9.5  --  9.4 9.3   Liver Function Tests:  Recent Labs Lab 11/26/13 1730 11/28/13 0354  AST 25 26  ALT 18 16  ALKPHOS 82 74  BILITOT 0.6 0.8  PROT 8.3 7.7  ALBUMIN 3.9 3.6   No results found for this basename: LIPASE, AMYLASE,  in the last 168 hours No results found for this basename: AMMONIA,  in the last 168 hours CBC:  Recent Labs Lab 11/26/13 1730 11/26/13 1738  WBC 8.6  --   NEUTROABS 6.0  --   HGB 14.5 16.3  HCT 43.7 48.0  MCV  79.5  --   PLT 327  --    Cardiac Enzymes: No results found for this basename: CKTOTAL, CKMB, CKMBINDEX, TROPONINI,  in the last 168 hours BNP: BNP (last 3 results) No results found for this basename: PROBNP,  in the last 8760 hours CBG: No results found for this basename: GLUCAP,  in the last 168 hours     Signed:  Nita Sells  [facility] 11/30/2013, 11:31 AM

## 2013-12-01 ENCOUNTER — Inpatient Hospital Stay (HOSPITAL_COMMUNITY): Payer: Medicare Other | Admitting: Rehabilitation

## 2013-12-01 ENCOUNTER — Inpatient Hospital Stay (HOSPITAL_COMMUNITY): Payer: Medicare Other | Admitting: Speech Pathology

## 2013-12-01 ENCOUNTER — Inpatient Hospital Stay (HOSPITAL_COMMUNITY): Payer: Medicare Other | Admitting: Occupational Therapy

## 2013-12-01 DIAGNOSIS — I6330 Cerebral infarction due to thrombosis of unspecified cerebral artery: Secondary | ICD-10-CM

## 2013-12-01 LAB — COMPREHENSIVE METABOLIC PANEL
ALBUMIN: 3.5 g/dL (ref 3.5–5.2)
ALT: 26 U/L (ref 0–53)
AST: 32 U/L (ref 0–37)
Alkaline Phosphatase: 73 U/L (ref 39–117)
BUN: 29 mg/dL — ABNORMAL HIGH (ref 6–23)
CO2: 20 mEq/L (ref 19–32)
CREATININE: 1.03 mg/dL (ref 0.50–1.35)
Calcium: 9 mg/dL (ref 8.4–10.5)
Chloride: 101 mEq/L (ref 96–112)
GFR calc Af Amer: 81 mL/min — ABNORMAL LOW (ref >90)
GFR calc non Af Amer: 69 mL/min — ABNORMAL LOW (ref >90)
Glucose, Bld: 123 mg/dL — ABNORMAL HIGH (ref 70–99)
POTASSIUM: 3.7 meq/L (ref 3.7–5.3)
Sodium: 138 mEq/L (ref 137–147)
Total Bilirubin: 0.8 mg/dL (ref 0.3–1.2)
Total Protein: 7.5 g/dL (ref 6.0–8.3)

## 2013-12-01 LAB — GLUCOSE, CAPILLARY
GLUCOSE-CAPILLARY: 114 mg/dL — AB (ref 70–99)
GLUCOSE-CAPILLARY: 126 mg/dL — AB (ref 70–99)
GLUCOSE-CAPILLARY: 130 mg/dL — AB (ref 70–99)
Glucose-Capillary: 121 mg/dL — ABNORMAL HIGH (ref 70–99)
Glucose-Capillary: 97 mg/dL (ref 70–99)
Glucose-Capillary: 107 mg/dL — ABNORMAL HIGH (ref 70–99)

## 2013-12-01 MED ORDER — LOPERAMIDE HCL 2 MG PO CAPS
4.0000 mg | ORAL_CAPSULE | Freq: Four times a day (QID) | ORAL | Status: DC | PRN
  Administered 2013-12-01 – 2013-12-09 (×2): 4 mg via ORAL
  Filled 2013-12-01 (×2): qty 2

## 2013-12-01 NOTE — Evaluation (Signed)
Physical Therapy Assessment and Plan  Patient Details  Name: Scott Conley MRN: [id number] Date of Birth: 12/15/1940  PT Diagnosis: Abnormal posture, Abnormality of gait, Coordination disorder, Hemiparesis non-dominant, Impaired sensation and Muscle weakness Rehab Potential: Excellent ELOS: 10-12 days   Today's Date: 12/01/2013 Time: 8563-1497 Time Calculation (min): 65 min  Problem List:  Patient Active Problem List   Diagnosis Date Noted  . CVA (cerebral infarction) 11/30/2013  . Stroke 11/26/2013    Past Medical History:  Past Medical History  Diagnosis Date  . Blood clot in vein   . Arthritis   . Diabetes   . CVA (cerebral infarction)   . Hypertension    Past Surgical History:  Past Surgical History  Procedure Laterality Date  . Cyst removal leg      removed from right groin  . Loop recorder implant  11/29/13    MDT LinQ implanted by Dr Caryl Comes for cryptogenic stroke  . Tee without cardioversion N/A 11/29/2013    Procedure: TRANSESOPHAGEAL ECHOCARDIOGRAM (TEE);  Surgeon: Sueanne Margarita, MD;  Location: [facility] ENDOSCOPY;  Service: Cardiovascular;  Laterality: N/A;    Assessment & Plan Clinical Impression: Patient is a 73 y.o. year old male on no scheduled medications. Independent prior to admission living alone. Presented 11/26/2013 with left-sided weakness and slurred speech. Noted elevated systolic blood pressure in the 200s as well as findings of potassium 6.6. MRI of the brain shows right hemispheric acute infarct. MRA of the head with atherosclerotic type changes. Echocardiogram with ejection fraction of 60% no wall motion abnormalities. Carotid Dopplers with no ICA stenosis. Patient did not receive TPA. Coreg was added for hypertension control. TEE 11/29/2013 had to be aborted before bubble study due to increased respiratory secretions with severe cough. Bedside saline contrast study performed which showed negative for interatrial shunt or embolism. Loop recorder placed  11/29/2013 per Dr. Caryl Comes. Neurology followup maintain on aspirin for CVA prophylaxis.  Patient transferred to CIR on 11/30/2013 .   Patient currently requires min with mobility secondary to muscle weakness, decreased cardiorespiratoy endurance, impaired timing and sequencing and decreased coordination and decreased problem solving and decreased memory.  Prior to hospitalization, patient was independent  with mobility and lived with Alone in a [place] home.  Home access is  Level entry.  Patient will benefit from skilled PT intervention to maximize safe functional mobility, minimize fall risk and decrease caregiver burden for planned discharge home with 24 hour supervision for one week then intermittent supervision.  Anticipate patient will benefit from follow up OP at discharge.  PT - End of Session Activity Tolerance: Tolerates 30+ min activity with multiple rests Endurance Deficit: No (allowed several rest breaks) PT Assessment Rehab Potential: Excellent Barriers to Discharge: Decreased caregiver support PT Patient demonstrates impairments in the following area(s): Balance;Endurance;Motor;Safety PT Transfers Functional Problem(s): Bed Mobility;Bed to Chair;Car;Furniture;Floor PT Locomotion Functional Problem(s): Ambulation;Stairs PT Plan PT Intensity: Minimum of 1-2 x/day ,45 to 90 minutes PT Frequency: 5 out of 7 days PT Duration Estimated Length of Stay: 10-12 days PT Treatment/Interventions: Ambulation/gait training;Balance/vestibular training;Discharge planning;DME/adaptive equipment instruction;Functional electrical stimulation;Functional mobility training;Neuromuscular re-education;Pain management;Patient/family education;Stair training;Therapeutic Activities;Therapeutic Exercise;UE/LE Strength taining/ROM;UE/LE Coordination activities;Wheelchair propulsion/positioning PT Transfers Anticipated Outcome(s): Mod I PT Locomotion Anticipated Outcome(s): supervision PT Recommendation Follow  Up Recommendations: Home health PT;Outpatient PT (HH if can't get to OP) Patient destination: Home Equipment Recommended: To be determined  Skilled Therapeutic Intervention PT evaluation and assessment completed, see details below.  Initiated gait training with RW x 100' x 2 reps  at min assist level. Note increased difficulty maintaining position inside of RW, esp during turns, almost making use of RW unsafe at this time.  Will continue to assess safety with use of AD during therapy sessions.  Also initiated stair training following assessment at min assist with cues for correct sequencing/technique.  Pt verbalized understanding.  Performed bed mobility in ADL apt to better simulate home environment at supervision level.  Ended session with w/c propulsion with BLEs in order to increased BLE strengthening and coordination.  Pt with increased difficulty sequencing feet in alternating pattern despite demonstration from therapist.  Pt assisted remainder of distance to room and then assisted to restroom via HHA.  Discussed ELOS, goals w/ expected outcomes, equipment and rehab schedule.  Pt verbalized understanding.  Pt left on toilet with OT in room to complete OT evaluation.    PT Evaluation Precautions/Restrictions Precautions Precautions: Fall Restrictions Weight Bearing Restrictions: No General   Vital Signs  Pain Pain Assessment Pain Assessment: No/denies pain Pain Score: 0-No pain Home Living/Prior Functioning Home Living Available Help at Discharge: Family;Available PRN/intermittently (daughter, will have 24/7 for a week following D/C) Type of Home: Apartment Home Access: Level entry Home Layout: One level  Lives With: Alone Prior Function Level of Independence: Independent with basic ADLs;Independent with gait;Independent with transfers  Able to Take Stairs?: No (never needed to use stairs) Driving: Yes Vocation: Retired Leisure: Hobbies-yes (Comment) (likes to play pool) Comments:  drives. Retired for 8 years; eats out alot-mostly for breakfast Vision/Perception   see OT note, however pt reports no change in vision from baseline.  Cognition Overall Cognitive Status: Impaired/Different from baseline (noted slight problem solving, awareness issues) Arousal/Alertness: Awake/alert Orientation Level: Oriented X4 Attention: Focused;Sustained;Selective;Alternating Focused Attention: Appears intact Sustained Attention: Appears intact Selective Attention: Appears intact Alternating Attention: Impaired Alternating Attention Impairment: Functional basic Memory: Impaired Memory Impairment: Decreased recall of new information Awareness: Impaired Awareness Impairment: Anticipatory impairment Problem Solving: Impaired Problem Solving Impairment: Functional complex Safety/Judgment: Appears intact Comments: Pt able to state that he should not get up on his own and should use call bell and wait for assist when getting OOB or chair.  Sensation Sensation Light Touch: Impaired Detail Light Touch Impaired Details: Impaired LUE Stereognosis: Not tested Hot/Cold: Not tested Proprioception: Appears Intact Coordination Gross Motor Movements are Fluid and Coordinated: No (mostly in LUE) Fine Motor Movements are Fluid and Coordinated: No (LUE) Coordination and Movement Description: Note difficulty coordinating movements with LUE.  Unsure if motor planning issue or due to decreased sensation.  Finger Nose Finger Test: Pt with slight over shooting/dysmetria with task.  Heel Shin Test: [facility] but not full ROM due to decreased hip strength in LLE.  Motor  Motor Motor: Hemiplegia;Abnormal postural alignment and control Motor - Skilled Clinical Observations: Pt demonstrates weakness in LUE>LE.  Also note coordination issues in LUE>LE with forward flexed trunk in sitting, standing and during ambulation.    Mobility Bed Mobility Bed Mobility: Supine to Sit;Sit to Supine Supine to Sit: 5:  Supervision Supine to Sit Details: Verbal cues for precautions/safety Sit to Supine: 5: Supervision Sit to Supine - Details: Verbal cues for technique;Verbal cues for precautions/safety Transfers Transfers: Yes Sit to Stand: 4: Min assist Sit to Stand Details: Verbal cues for sequencing;Verbal cues for technique;Verbal cues for precautions/safety;Manual facilitation for weight shifting Stand to Sit: 4: Min assist Stand to Sit Details (indicate cue type and reason): Verbal cues for sequencing;Verbal cues for technique;Verbal cues for precautions/safety;Manual facilitation for weight shifting Stand Pivot  Transfers: 4: Min Psychologist, occupational Details: Verbal cues for sequencing;Verbal cues for technique;Verbal cues for precautions/safety;Manual facilitation for weight shifting;Manual facilitation for weight bearing Locomotion  Ambulation Ambulation: Yes Ambulation/Gait Assistance: 4: Min assist Ambulation Distance (Feet): 90 Feet (then another 100' x 2 with RW) Assistive device: Rolling walker Ambulation/Gait Assistance Details: Verbal cues for sequencing;Verbal cues for technique;Verbal cues for precautions/safety;Verbal cues for gait pattern;Verbal cues for safe use of DME/AE;Manual facilitation for placement;Manual facilitation for weight shifting Ambulation/Gait Assistance Details: Pt with increased difficulty with placing RW, esp during turns and tends to increase forward flexed posture with use of RW.  Gait Gait: Yes Gait Pattern: Impaired Gait Pattern: Trunk flexed;Step-through pattern;Decreased stride length;Narrow base of support Stairs / Additional Locomotion Stairs: Yes Stairs Assistance: 4: Min assist Stairs Assistance Details: Verbal cues for sequencing;Verbal cues for technique;Verbal cues for precautions/safety;Verbal cues for gait pattern;Manual facilitation for weight shifting Stair Management Technique: Two rails;Step to pattern;Alternating pattern;Forwards Number  of Stairs: 5 Height of Stairs: 4 Architect: Yes Wheelchair Assistance: 5: Investment banker, operational Details: Verbal cues for sequencing;Verbal cues for technique;Verbal cues for Information systems manager: Both lower extermities Wheelchair Parts Management: Needs assistance Distance: 100  Trunk/Postural Assessment  Lumbar Assessment Lumbar Assessment: Exceptions to [facility] Lumbar Strength Overall Lumbar Strength Comments: Pt demonstrates posterior pelvic tilt during sitting.  Postural Control Postural Control: Deficits on evaluation Postural Limitations: Pt demonstrates forward flexed trunk, protracted shoulders and forward head during gait training, esp with RW.   Balance Balance Balance Assessed: Yes Static Sitting Balance Static Sitting - Balance Support: Feet supported;Bilateral upper extremity supported Static Sitting - Level of Assistance: 5: Stand by assistance Dynamic Sitting Balance Dynamic Sitting - Balance Support: Feet supported;During functional activity Dynamic Sitting - Level of Assistance: 5: Stand by assistance Static Standing Balance Static Standing - Balance Support: During functional activity Static Standing - Level of Assistance: 4: Min assist Dynamic Standing Balance Dynamic Standing - Balance Support: During functional activity Dynamic Standing - Level of Assistance: 4: Min assist Dynamic Standing - Balance Activities: Lateral lean/weight shifting;Forward lean/weight shifting Dynamic Standing - Comments: Pt able to adjust brief prior to toileting during session with light min assist for steadying.  Extremity Assessment      RLE Assessment RLE Assessment: Within Functional Limits LLE Assessment LLE Assessment: Exceptions to [facility] LLE Strength LLE Overall Strength: Deficits LLE Overall Strength Comments: Pt with grossly 3+/5 to 4/5 strength, however did note weakness in hip flex and functional weakness of  ankle DF while performing stairs.   FIM:  FIM - Bed/Chair Transfer Bed/Chair Transfer: 5: Supine > Sit: Supervision (verbal cues/safety issues);5: Sit > Supine: Supervision (verbal cues/safety issues);4: Bed > Chair or W/C: Min A (steadying Pt. > 75%);4: Chair or W/C > Bed: Min A (steadying Pt. > 75%) FIM - Locomotion: Wheelchair Distance: 100 Locomotion: Wheelchair: 2: Travels 50 - 149 ft with supervision, cueing or coaxing FIM - Locomotion: Ambulation Locomotion: Ambulation Assistive Devices: Walker - Rolling;Other (comment) (HHA and with RW) Ambulation/Gait Assistance: 4: Min assist Locomotion: Ambulation: 2: Travels 50 - 149 ft with minimal assistance (Pt.>75%) FIM - Locomotion: Stairs Locomotion: Scientist, physiological: Hand rail - 2 Locomotion: Stairs: 2: Up and Down 4 - 11 stairs with minimal assistance (Pt.>75%)   Refer to Care Plan for Long Term Goals  Recommendations for other services: None  Discharge Criteria: Patient will be discharged from PT if patient refuses treatment 3 consecutive times without medical reason, if treatment goals not met, if  there is a change in medical status, if patient makes no progress towards goals or if patient is discharged from hospital.  The above assessment, treatment plan, treatment alternatives and goals were discussed and mutually agreed upon: by patient  Raquel Sarna A  12/01/2013, 9:58 AM

## 2013-12-01 NOTE — Progress Notes (Signed)
Physical Therapy Session Note  Patient Details  Name: Scott Conley MRN: [id number] Date of Birth: 08/16/1940  Today's Date: 12/01/2013 Time: 1130-1157 Time Calculation (min): 27 min  Short Term Goals: Week 1:  PT Short Term Goal 1 (Week 1): =LTG's due to ELOS  Skilled Therapeutic Interventions/Progress Updates:  Pt received sitting in w/c in room, agreeable to therapy this morning.  Pt ambulated >150' to/from therapy gym with min assist.  Provided tactile cues for upright posture and min facilitation for increased weight shift to the R in order to better clear LLE.  Note at times that pt tends to drag LLE during gait.  Feel that it may be more motor planning issues than actual strength deficits.  Once in gym, performed BERG balance test with score of 33/56 (see details below).  Discussed results placed him at a high fall risk with understanding that this will be improved on and will assist in determining if AD needed at time of D/C.  Pt ambulated back to room as stated above.  Left in w/c with all needs in reach and nurse tech in room.    Therapy Documentation Precautions:  Precautions Precautions: Fall Precaution Comments: LUE and LLE mild hemiparesis Restrictions Weight Bearing Restrictions: No   Pain: Pain Assessment Pain Assessment: No/denies pain Pain Score: 0-No pain  Balance: Balance Balance Assessed: Yes Standardized Balance Assessment Standardized Balance Assessment: Berg Balance Test Berg Balance Test Sit to Stand: Able to stand  independently using hands Standing Unsupported: Able to stand 2 minutes with supervision Sitting with Back Unsupported but Feet Supported on Floor or Stool: Able to sit safely and securely 2 minutes Stand to Sit: Sits safely with minimal use of hands Transfers: Able to transfer safely, definite need of hands Standing Unsupported with Eyes Closed: Able to stand 10 seconds with supervision Standing Ubsupported with Feet Together: Able to  place feet together independently and stand for 1 minute with supervision From Standing, Reach Forward with Outstretched Arm: Can reach forward >12 cm safely (5") From Standing Position, Pick up Object from Floor: Able to pick up shoe, needs supervision From Standing Position, Turn to Look Behind Over each Shoulder: Looks behind one side only/other side shows less weight shift Turn 360 Degrees: Needs close supervision or verbal cueing Standing Unsupported, Alternately Place Feet on Step/Stool: Needs assistance to keep from falling or unable to try Standing Unsupported, One Foot in Front: Loses balance while stepping or standing Standing on One Leg: Unable to try or needs assist to prevent fall Total Score: 33  See FIM for current functional status  Therapy/Group: Individual Therapy  Raquel Sarna A Youa Deloney 12/01/2013, 12:04 PM

## 2013-12-01 NOTE — Progress Notes (Signed)
Inpatient [facility] Individual Statement of Services  Patient Name:  Scott Conley  Date:  12/01/2013  Welcome to the [facility].  Our goal is to provide you with an individualized program based on your diagnosis and situation, designed to meet your specific needs.  With this comprehensive rehabilitation program, you will be expected to participate in at least 3 hours of rehabilitation therapies Monday-Friday, with modified therapy programming on the weekends.  Your rehabilitation program will include the following services:  Physical Therapy (PT), Occupational Therapy (OT), Speech Therapy (ST), 24 hour per day rehabilitation nursing, Therapeutic Recreation (TR), Neuropsychology, Case Management (Social Worker), Rehabilitation Medicine, Nutrition Services and Pharmacy Services  Weekly team conferences will be held on Wednesdays to discuss your progress.  Your Social Worker will talk with you frequently to get your input and to update you on team discussions.  Team conferences with you and your family in attendance may also be held.  Expected length of stay: 10 days  Overall anticipated outcome: Modified Independent  Depending on your progress and recovery, your program may change. Your Social Worker will coordinate services and will keep you informed of any changes. Your Social Worker's name and contact numbers are listed  below.  The following services may also be recommended but are not provided by the [facility] will be made to provide these services after discharge if needed.  Arrangements include referral to agencies that provide these services.  Your insurance has been verified to be:  Medicare Your primary doctor is:  None  Pertinent information will be shared with your doctor and your insurance company.  Social  Worker:  Alfonse Alpers, LCSW  [phone] or [phone]  Information discussed with and copy given to patient by: [name], 12/01/2013, 2:04 PM

## 2013-12-01 NOTE — Progress Notes (Signed)
Subjective/Complaints: 73 y.o. right-handed male on no scheduled medications. Independent prior to admission living alone. Presented 11/26/2013 with left-sided weakness and slurred speech. Noted elevated systolic blood pressure in the 200s as well as findings of potassium 6.6. MRI of the brain shows right hemispheric acute infarct. MRA of the head with atherosclerotic type changes. Echocardiogram with ejection fraction of 60% no wall motion abnormalities. Carotid Dopplers with no ICA stenosis. Patient did not receive TPA. Coreg was added for hypertension control. TEE 11/29/2013 had to be aborted before bubble study due to increased respiratory secretions with severe cough. Bedside saline contrast study performed which showed negative for interatrial shunt or embolism. Loop recorder placed 11/29/2013 per Dr. Caryl Comes. Neurology followup maintain on aspirin for CVA prophylaxis   Loose BM x 2 Objective: Vital Signs: Blood pressure 146/81, pulse 73, temperature 97.9 F (36.6 C), temperature source Oral, resp. rate 19, height '6\' 4"'  (1.93 m), weight 106.414 kg (234 lb 9.6 oz), SpO2 93.00%. No results found. Results for orders placed during the hospital encounter of 11/30/13 (from the past 72 hour(s))  GLUCOSE, CAPILLARY     Status: Abnormal   Collection Time    11/30/13  4:02 PM      Result Value Ref Range   Glucose-Capillary 119 (*) 70 - 99 mg/dL  CBC WITH DIFFERENTIAL     Status: Abnormal   Collection Time    11/30/13  4:06 PM      Result Value Ref Range   WBC 10.3  4.0 - 10.5 K/uL   RBC 5.64  4.22 - 5.81 MIL/uL   Hemoglobin 15.1  13.0 - 17.0 g/dL   HCT 45.2  39.0 - 52.0 %   MCV 80.1  78.0 - 100.0 fL   MCH 26.8  26.0 - 34.0 pg   MCHC 33.4  30.0 - 36.0 g/dL   RDW 16.2 (*) 11.5 - 15.5 %   Platelets 276  150 - 400 K/uL   Neutrophils Relative % 67  43 - 77 %   Neutro Abs 6.8  1.7 - 7.7 K/uL   Lymphocytes Relative 25  12 - 46 %   Lymphs Abs 2.6  0.7 - 4.0 K/uL   Monocytes Relative 6  3 - 12 %    Monocytes Absolute 0.6  0.1 - 1.0 K/uL   Eosinophils Relative 2  0 - 5 %   Eosinophils Absolute 0.2  0.0 - 0.7 K/uL   Basophils Relative 0  0 - 1 %   Basophils Absolute 0.0  0.0 - 0.1 K/uL  COMPREHENSIVE METABOLIC PANEL     Status: Abnormal   Collection Time    11/30/13  4:06 PM      Result Value Ref Range   Sodium 139  137 - 147 mEq/L   Potassium 4.0  3.7 - 5.3 mEq/L   Chloride 101  96 - 112 mEq/L   CO2 22  19 - 32 mEq/L   Glucose, Bld 116 (*) 70 - 99 mg/dL   BUN 27 (*) 6 - 23 mg/dL   Creatinine, Ser 1.13  0.50 - 1.35 mg/dL   Calcium 9.8  8.4 - 10.5 mg/dL   Total Protein 8.1  6.0 - 8.3 g/dL   Albumin 3.9  3.5 - 5.2 g/dL   AST 34  0 - 37 U/L   ALT 28  0 - 53 U/L   Alkaline Phosphatase 78  39 - 117 U/L   Total Bilirubin 0.8  0.3 - 1.2 mg/dL   GFR calc  non Af Amer 62 (*) >90 mL/min   GFR calc Af Amer 72 (*) >90 mL/min   Comment: (NOTE)     The eGFR has been calculated using the CKD EPI equation.     This calculation has not been validated in all clinical situations.     eGFR's persistently <90 mL/min signify possible Chronic Kidney     Disease.  GLUCOSE, CAPILLARY     Status: Abnormal   Collection Time    11/30/13  8:48 PM      Result Value Ref Range   Glucose-Capillary 110 (*) 70 - 99 mg/dL  GLUCOSE, CAPILLARY     Status: Abnormal   Collection Time    12/01/13 12:08 AM      Result Value Ref Range   Glucose-Capillary 114 (*) 70 - 99 mg/dL  GLUCOSE, CAPILLARY     Status: Abnormal   Collection Time    12/01/13  4:14 AM      Result Value Ref Range   Glucose-Capillary 121 (*) 70 - 99 mg/dL  COMPREHENSIVE METABOLIC PANEL     Status: Abnormal   Collection Time    12/01/13  5:08 AM      Result Value Ref Range   Sodium 138  137 - 147 mEq/L   Potassium 3.7  3.7 - 5.3 mEq/L   Chloride 101  96 - 112 mEq/L   CO2 20  19 - 32 mEq/L   Glucose, Bld 123 (*) 70 - 99 mg/dL   BUN 29 (*) 6 - 23 mg/dL   Creatinine, Ser 1.03  0.50 - 1.35 mg/dL   Calcium 9.0  8.4 - 10.5 mg/dL   Total  Protein 7.5  6.0 - 8.3 g/dL   Albumin 3.5  3.5 - 5.2 g/dL   AST 32  0 - 37 U/L   ALT 26  0 - 53 U/L   Alkaline Phosphatase 73  39 - 117 U/L   Total Bilirubin 0.8  0.3 - 1.2 mg/dL   GFR calc non Af Amer 69 (*) >90 mL/min   GFR calc Af Amer 81 (*) >90 mL/min   Comment: (NOTE)     The eGFR has been calculated using the CKD EPI equation.     This calculation has not been validated in all clinical situations.     eGFR's persistently <90 mL/min signify possible Chronic Kidney     Disease.     HEENT: normal Cardio: RRR and no murmurs Resp: CTA B/L and unlabored GI: BS positive and NT,ND Extremity:  No Edema Skin:   Intact Neuro: Alert/Oriented, Cranial Nerve II-XII normal, Normal Sensory, Abnormal Motor 4- Left denltoid , bi, tri,grip, 4/5 B HF, KE, ADF, 5/5  RUE and Abnormal FMC Ataxic/ dec FMC Musc/Skel:  Normal Gen NAD   Assessment/Plan: 1. Functional deficits secondary to Right embolic MCA infarct which require 3+ hours per day of interdisciplinary therapy in a comprehensive inpatient rehab setting. Physiatrist is providing close team supervision and 24 hour management of active medical problems listed below. Physiatrist and rehab team continue to assess barriers to discharge/monitor patient progress toward functional and medical goals. FIM:       FIM - Toileting Toileting steps completed by patient: Performs perineal hygiene Toileting: 2: Max-Patient completed 1 of 3 steps  FIM - Radio producer Devices: Elevated toilet seat Toilet Transfers: 4-To toilet/BSC: Min A (steadying Pt. > 75%);4-From toilet/BSC: Min A (steadying Pt. > 75%)  FIM - Bed/Chair Transfer Bed/Chair Transfer: 5: Supine >  Sit: Supervision (verbal cues/safety issues);4: Sit > Supine: Min A (steadying pt. > 75%/lift 1 leg);4: Bed > Chair or W/C: Min A (steadying Pt. > 75%);4: Chair or W/C > Bed: Min A (steadying Pt. > 75%)     Comprehension Comprehension Mode:  Auditory Comprehension: 6-Follows complex conversation/direction: With extra time/assistive device  Expression Expression Mode: Verbal Expression: 6-Expresses complex ideas: With extra time/assistive device  Social Interaction Social Interaction: 6-Interacts appropriately with others with medication or extra time (anti-anxiety, antidepressant).  Problem Solving Problem Solving: 6-Solves complex problems: With extra time  Memory Memory: 6-More than reasonable amt of time  Medical Problem List and Plan:  1. Functional deficits secondary to right MCA infarct felt to be embolic.  2. DVT Prophylaxis/Anticoagulation: Subcutaneous Lovenox. Monitor platelet counts and any signs of bleeding  3. Pain Management: Tylenol as needed  5. Neuropsych: This patient is capable of making decisions on his own behalf.  6. Hypertension. Coreg 6.25 mg twice a day. Monitor with increased mobility and adjust appropriately. Has had high readings over last 24 hours  7. Newly diagnosed diabetes mellitus. Hemoglobin A1c 7.4. Glucophage 500 mg daily. Provide diabetic teaching. Check blood sugars a.c. and at bedtime. Adjust regimen as needed for tighter control  8. Hyperlipidemia. Lipitor  9.  Diarrhea, hold metformin, stool for C diff pnd  LOS (Days) 1 A FACE TO FACE EVALUATION WAS PERFORMED  Scott Conley 12/01/2013, 7:01 AM

## 2013-12-01 NOTE — Evaluation (Signed)
Speech Language Pathology Assessment and Plan  Patient Details  Name: Scott Conley MRN: [id number] Date of Birth: 1941/06/14  SLP Diagnosis:  none Rehab Potential:  defer to OT/PT ELOS:   defer to OT/PT  Today's Date: 12/01/2013 Time: 8242-3536 Time Calculation (min): 55 min  Problem List:  Patient Active Problem List   Diagnosis Date Noted  . CVA (cerebral infarction) 11/30/2013  . Stroke 11/26/2013   Past Medical History:  Past Medical History  Diagnosis Date  . Blood clot in vein   . Arthritis   . Diabetes   . CVA (cerebral infarction)   . Hypertension    Past Surgical History:  Past Surgical History  Procedure Laterality Date  . Cyst removal leg      removed from right groin  . Loop recorder implant  11/29/13    MDT LinQ implanted by Dr Caryl Comes for cryptogenic stroke  . Tee without cardioversion N/A 11/29/2013    Procedure: TRANSESOPHAGEAL ECHOCARDIOGRAM (TEE);  Surgeon: Sueanne Margarita, MD;  Location: [facility] ENDOSCOPY;  Service: Cardiovascular;  Laterality: N/A;    Assessment / Plan / Recommendation Clinical Impression Scott Conley is a 73 y.o. right-handed male on no scheduled medications. Independent prior to admission living alone. Presented 11/26/2013 with left-sided weakness and slurred speech. Noted elevated systolic blood pressure in the 200s as well as findings of potassium 6.6. MRI of the brain shows right hemispheric acute infarct. MRA of the head with atherosclerotic type changes. Echocardiogram with ejection fraction of 60% no wall motion abnormalities. Carotid Dopplers with no ICA stenosis. Patient did not receive TPA. Coreg was added for hypertension control. TEE 11/29/2013 had to be aborted before bubble study due to increased respiratory secretions with severe cough. Bedside saline contrast study performed which showed negative for interatrial shunt or embolism. Loop recorder placed 11/29/2013 per Dr. Caryl Comes. Neurology followup maintain on aspirin for CVA  prophylaxis as well as subcutaneous Lovenox for DVT prophylaxis. Elevated hemoglobin A1c of 7.4 Glucophage initiated. Physical therapy evaluation completed 11/27/2013 with recommendations of physical medicine rehabilitation consult. Patient was admitted for comprehensive rehabilitation program 11/30/13.  Orders received; cognitive-linguistic evaluation completed.  MOCA score within normal limits and patient Mod I with all tasks presented.  As a result, no skilled SLP services are warranted at this time.  Skilled Therapeutic Interventions          SLP provided education regarding diet changes and medication management as a means to reduce stroke risk; patient verbalized understanding.    SLP Assessment  Patient does not need any further Speech [place] Pathology Services    Recommendations  Patient destination: Home Follow up Recommendations: None Equipment Recommended: None recommended by SLP               Pain Pain Assessment Pain Assessment: No/denies pain Prior Functioning Cognitive/Linguistic Baseline: Within functional limits Type of Home: Apartment  Lives With: Alone Available Help at Discharge: Family;Available PRN/intermittently Education: Highschool, 2 years college  Vocation: Retired  See FIM for current functional status  Recommendations for other services: None  The above assessment, treatment plan, treatment alternatives and goals were discussed and mutually agreed upon: by patient  Carmelia Roller., [place] 12/01/2013, 4:34 PM

## 2013-12-01 NOTE — Progress Notes (Signed)
Social Work Assessment and Plan  Patient Details  Name: Scott Conley MRN: [id number] Date of Birth: 11-08-1940  Today's Date: 12/01/2013  Problem List:  Patient Active Problem List   Diagnosis Date Noted  . CVA (cerebral infarction) 11/30/2013  . Stroke 11/26/2013   Past Medical History:  Past Medical History  Diagnosis Date  . Blood clot in vein   . Arthritis   . Diabetes   . CVA (cerebral infarction)   . Hypertension    Past Surgical History:  Past Surgical History  Procedure Laterality Date  . Cyst removal leg      removed from right groin  . Loop recorder implant  11/29/13    MDT LinQ implanted by Dr Caryl Comes for cryptogenic stroke  . Tee without cardioversion N/A 11/29/2013    Procedure: TRANSESOPHAGEAL ECHOCARDIOGRAM (TEE);  Surgeon: Sueanne Margarita, MD;  Location: [facility] ENDOSCOPY;  Service: Cardiovascular;  Laterality: N/A;   Social History:  reports that he has been smoking Cigarettes.  He has a 29 pack-year smoking history. He has never used smokeless tobacco. He reports that he does not drink alcohol or use illicit drugs.  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  [name] to coordinate with sister [name] to cover friday, day of d/c.  otherwise [name] prn and two sons prn once [name] returns home. Ability/Limitations of Caregiver: [name] lives in [place], [place] and two local sons also work Caregiver Availability: Other (Comment) Family Dynamics: close, supportive family  Social History Preferred language: English Religion:  Education: couple of years of college Read: Yes Write: Yes Employment Status: Retired Date Retired/Disabled/Unemployed: 2007 Age Retired: 65 Lexicographer Issues: None reported Guardian/Conservator: N/A   Abuse/Neglect Physical Abuse: Denies Verbal Abuse: Denies Sexual Abuse: Denies Exploitation of patient/patient's resources: Denies Self-Neglect: Denies  Emotional Status Pt's affect, behavior and adjustment status: Pt was upbeat and ready to work hard with therapists to improve his condition.   Recent Psychosocial Issues: None reported Pyschiatric History: None reported Substance Abuse History: None reported  Patient / Family Perceptions, Expectations & Goals Pt/Family understanding of illness & functional limitations: Pt reports a good understanding of his condition and limitations.  He did not have any questions, nor did his dtr, [name]. Premorbid pt/family roles/activities: Pt likes to play pool 5-6 x/week and used to enjoy bowling. Anticipated changes in roles/activities/participation: Pt was helping his 6 y/o mother PTA.  Other family members helping out. Pt/family expectations/goals: Pt wants "to get back to being independent."  [employer]: None Premorbid Home Care/DME Agencies: None Transportation available at discharge: family Resource referrals recommended: Support group (specify)  Discharge Planning Living Arrangements: Alone;Children (dtr [name] for one week) Support Systems: Spouse/significant other;Other relatives;Friends/neighbors Type of Residence: Private residence Insurance Resources: Education officer, museum (specify) Personnel officer for prescriptions) Financial Resources: Social Security;Other (Comment) (retirement funds from [employer]) Financial Screen Referred: No Living Expenses: Rent Money Management: Patient Does the patient have any problems obtaining your medications?: No Home Management: Pt was doing all of this PTA.  His family will asssit. Patient/Family Preliminary Plans: Pt to return to his home with dtr, [name], initially for one week and then family to check on pt as  needed. Social Work Anticipated Follow Up Needs: [place] Additional Notes/Comments: Pt  feels someone can transport him to outpatient appointments. Expected length of stay: 10 days  Clinical Impression CSW met with pt to introduce self and role of CSW, as well as complete assessment. Pt was very pleasant with CSW and reported feeling comfortable with d/c on 12-10-13.  He explained that his dtr from [place] will initially stay with him for a week and after that, he will have intermittent/as needed visits from local dtr and 2 sons.  Pt also has a good support network of friends.  Pt does not have a PCP and has really not had a lot of medical interventions prior to this stroke.  Pt is open to any needed DME and follow up therapies.  CSW will arrange this closer to pt's d/c.  CSW did talk with pt's dtr, [name], who plans to come to [facility] on 12-11-13.  She will talk with her sister, [name], to coordinate pt's care prior to [name]'s arrival.  Encouraged dtr to start working on finding pt a PCP by talking with other family members and friends in the early.  CSW will continue to follow and assist pt as needed.   [name] 12/01/2013, 2:20 PM

## 2013-12-01 NOTE — Patient Care Conference (Signed)
Inpatient RehabilitationTeam Conference and Plan of Care Update Date: 12/01/2013   Time: 11:00 AM    Patient Name: Scott Conley      Medical Record Number: [id number]  Date of Birth: 1941/05/30 Sex: Male         Room/Bed: 4W05C/4W05C-01 Payor Info: Payor: MEDICARE / Plan: MEDICARE PART A AND B / Product Type: *No Product type* /    Admitting Diagnosis: CVA  Admit Date/Time:  11/30/2013  1:47 PM Admission Comments: No comment available   Primary Diagnosis:  <principal problem not specified> Principal Problem: <principal problem not specified>  Patient Active Problem List   Diagnosis Date Noted  . CVA (cerebral infarction) 11/30/2013  . Stroke 11/26/2013    Expected Discharge Date: Expected Discharge Date: 12/10/13  Team Members Present: Physician leading conference: Dr. Alysia Penna Social Worker Present: Ovidio Kin, LCSW Nurse Present: Elliot Cousin, RN PT Present: Cameron Sprang, PT OT Present: Clyda Greener, Artemio Aly, OT SLP Present: Gunnar Fusi, SLP PPS Coordinator present : Ileana Ladd, PT     Current Status/Progress Goal Weekly Team Focus  Medical   Diarrhea question etiology, left-sided weakness primarily upper extremity  Maintain medical stability during inpatient rehabilitation stay  Evaluate etiology of diarrhea   Bowel/Bladder   cont of bowel and bladder - 7 loose bm's since 11/30/13 on imodium 4mg  q6 hrs prn, cdiff precautions  decrease loose stools  continue to monitor bowel pattern and bladder   Swallow/Nutrition/ Hydration   eval pending          ADL's             Mobility   min assist overall for transfers, gait and stairs  supervision to Mod I  balance, gait, stairs, pt/family training   Communication   eval pending          Safety/Cognition/ Behavioral Observations  eval pending   Mod I  continue to monitor safety precautions   Pain   no complaints of pain  less than or equal to 2  continue to monitor   Skin   No skin  breakdown  No new skin breakdown this admission  continue to monitor    Rehab Goals Patient on target to meet rehab goals: Yes Rehab Goals Revised: None - pt's first conference *See Care Plan and progress notes for long and short-term goals.  Barriers to Discharge: See above    Possible Resolutions to Barriers:  See above    Discharge Planning/Teaching Needs:  Pt will return to his home with family support.  Pt will have 24/7 coverage for first week post d/c.  Pt's local dtr can attend family education, as dtr from [place] will not arrive prior to d/c.   Team Discussion:  Pt with diarrhea and is being checked for c-diff and metformin has been d/c'd to see if it was the cause for diarrhea.  Pt is continent of bowel and bladder.  Pt is currently at min assist level now with modified independent goals.  It is recommended that pt receive outpt therapies if family/friends can provide transportation.  Revisions to Treatment Plan:  None   Continued Need for Acute Rehabilitation Level of Care: The patient requires daily medical management by a physician with specialized training in physical medicine and rehabilitation for the following conditions: Daily direction of a multidisciplinary physical rehabilitation program to ensure safe treatment while eliciting the highest outcome that is of practical value to the patient.: Yes Daily medical management of patient stability for increased activity  during participation in an intensive rehabilitation regime.: Yes Daily analysis of laboratory values and/or radiology reports with any subsequent need for medication adjustment of medical intervention for : Neurological problems;Other  Silvestre Mesi Kenae Lindquist 12/01/2013, 2:34 PM

## 2013-12-01 NOTE — Evaluation (Signed)
Occupational Therapy Assessment and Plan  Patient Details  Name: Scott Conley MRN: [id number] Date of Birth: April 22, 1941  OT Diagnosis: abnormal posture, hemiplegia affecting non-dominant side and muscle weakness (generalized) Rehab Potential: Rehab Potential: Excellent ELOS: 10 -12 days   Today's Date: 12/01/2013 Time: 1130-1157 Time Calculation (min): 27 min  Problem List:  Patient Active Problem List   Diagnosis Date Noted  . CVA (cerebral infarction) 11/30/2013  . Stroke 11/26/2013    Past Medical History:  Past Medical History  Diagnosis Date  . Blood clot in vein   . Arthritis   . Diabetes   . CVA (cerebral infarction)   . Hypertension    Past Surgical History:  Past Surgical History  Procedure Laterality Date  . Cyst removal leg      removed from right groin  . Loop recorder implant  11/29/13    MDT LinQ implanted by Dr Caryl Comes for cryptogenic stroke  . Tee without cardioversion N/A 11/29/2013    Procedure: TRANSESOPHAGEAL ECHOCARDIOGRAM (TEE);  Surgeon: Sueanne Margarita, MD;  Location: [facility] ENDOSCOPY;  Service: Cardiovascular;  Laterality: N/A;    Assessment & Plan Clinical Impression: Patient is a 73 y.o. year old male with recent admission to the hospital on 11/26/2013 with left-sided weakness and slurred speech. Noted elevated systolic blood pressure in the 200s as well as findings of potassium 6.6. MRI of the brain shows right hemispheric acute infarct. MRA of the head with atherosclerotic type changes.  Patient transferred to CIR on 11/30/2013 .    Patient currently requires min with basic self-care skills secondary to impaired timing and sequencing, unbalanced muscle activation, motor apraxia, decreased coordination and decreased motor planning and decreased standing balance, decreased postural control and hemiplegia.  Prior to hospitalization, patient could complete ADLs and IADLs with independent .  Patient will benefit from skilled intervention to decrease level of  assist with basic self-care skills prior to discharge home with care partner.  Anticipate patient will require intermittent supervision and follow up outpatient.  OT - End of Session Activity Tolerance: Tolerates 30+ min activity without fatigue Endurance Deficit: No (allowed several rest breaks) OT Assessment Rehab Potential: Excellent OT Patient demonstrates impairments in the following area(s): Balance;Motor;Safety;Sensory OT Basic ADL's Functional Problem(s): Eating;Bathing;Dressing;Toileting OT Advanced ADL's Functional Problem(s): Simple Meal Preparation OT Transfers Functional Problem(s): Toilet;Tub/Shower OT Additional Impairment(s): Fuctional Use of Upper Extremity OT Plan OT Intensity: Minimum of 1-2 x/day, 45 to 90 minutes OT Frequency: 5 out of 7 days OT Duration/Estimated Length of Stay: 10 -12 days OT Treatment/Interventions: Medical illustrator training;Community reintegration;Discharge planning;Functional mobility training;DME/adaptive equipment instruction;Neuromuscular re-education;Patient/family education;Therapeutic Activities;Splinting/orthotics;Self Care/advanced ADL retraining;Therapeutic Exercise;UE/LE Strength taining/ROM;UE/LE Coordination activities OT Self Feeding Anticipated Outcome(s): modified independent OT Basic Self-Care Anticipated Outcome(s): modified independent OT Toileting Anticipated Outcome(s): modified independent OT Bathroom Transfers Anticipated Outcome(s): modified independent OT Recommendation Patient destination: Home Follow Up Recommendations: Outpatient OT Equipment Recommended: Tub/shower bench;3 in 1 bedside comode   Skilled Therapeutic Intervention   OT Evaluation Precautions/Restrictions  Precautions Precautions: Fall Precaution Comments: LUE and LLE mild hemiparesis Restrictions Weight Bearing Restrictions: No General   Vital Signs   Pain Pain Assessment Pain Assessment: No/denies pain Home Living/Prior Functioning Home  Living Family/patient expects to be discharged to:: Private residence Living Arrangements: Alone;Children Available Help at Discharge: Family;Available PRN/intermittently (daughter, will have 24/7 for a week following D/C) Type of Home: Apartment Home Access: Level entry Home Layout: One level  Lives With: Alone Prior Function Level of Independence: Independent with basic ADLs;Independent with gait;Independent with  transfers  Able to Take Stairs?: No (never needed to use stairs) Driving: Yes Vocation: Retired Leisure: Hobbies-yes (Comment) (likes to play pool) Comments: drives. Retired for 8 years; eats out alot-mostly for breakfast ADL   Vision/Perception  Vision- History Baseline Vision/History: Wears glasses (Pt had Lasik surgery but still has glasses for reading.) Patient Visual Report: No change from baseline Vision- Assessment Vision Assessment?: No apparent visual deficits;Yes Eye Alignment: Within Functional Limits Ocular Range of Motion: Within Functional Limits Alignment/Gaze Preference: Within Defined Limits Tracking/Visual Pursuits: Able to track stimulus in all quads without difficulty Saccades: Within functional limits Convergence: Within functional limits Visual Fields: No apparent deficits  Cognition Overall Cognitive Status: Within Functional Limits for tasks assessed Arousal/Alertness: Awake/alert Orientation Level: Oriented X4 Attention: Focused;Sustained;Selective Focused Attention: Appears intact Sustained Attention: Appears intact Selective Attention: Appears intact Awareness: Appears intact Awareness Impairment: Anticipatory impairment Safety/Judgment: Appears intact Comments: Pt able to state that he should not get up on his own and should use call bell and wait for assist when getting OOB or chair.  Sensation Sensation Light Touch: Impaired Detail Light Touch Impaired Details: Impaired LUE Stereognosis: Impaired Detail Stereognosis Impaired  Details: Impaired LUE Hot/Cold: Not tested Proprioception: Impaired Detail Proprioception Impaired Details: Impaired LUE Additional Comments: Pt with decreased light touch acuity as well as propriception in the left hand and digits. Coordination Gross Motor Movements are Fluid and Coordinated: No Fine Motor Movements are Fluid and Coordinated: No Coordination and Movement Description: Pt with decreased ability to motor plan functional use of the LUE with FM tasks.   Finger Nose Finger Test: Much slower with slight ataxia in the LUE compared to the right. Heel Shin Test: [facility] but not full ROM due to decreased hip strength in LLE.  Motor  Motor Motor: Hemiplegia;Abnormal postural alignment and control Motor - Skilled Clinical Observations: Pt demonstrates weakness in LUE>LE.  Also note coordination issues in LUE>LE with forward flexed trunk in sitting, standing and during ambulation.   Motor - Discharge Observations: Pt with increased weaknss in the LUE and LLE.  Flexed trunk in standing as well.  Mobility  Bed Mobility Bed Mobility: Supine to Sit;Sit to Supine Supine to Sit: 5: Supervision Supine to Sit Details: Verbal cues for precautions/safety Sit to Supine: 5: Supervision Sit to Supine - Details: Verbal cues for technique;Verbal cues for precautions/safety Transfers Transfers: Sit to Stand Sit to Stand: 4: Min assist;Without upper extremity assist;From toilet;From chair/3-in-1 Sit to Stand Details: Verbal cues for sequencing;Verbal cues for technique;Verbal cues for precautions/safety;Manual facilitation for weight shifting Stand to Sit: 4: Min assist;With upper extremity assist Stand to Sit Details (indicate cue type and reason): Verbal cues for sequencing;Verbal cues for technique;Verbal cues for precautions/safety;Manual facilitation for weight shifting  Trunk/Postural Assessment  Cervical Assessment Cervical Assessment: Within Functional Limits Thoracic Assessment Thoracic  Assessment: Exceptions to [facility] Thoracic Strength Overall Thoracic Strength Comments: Pt with slight thoracic kyphosis in standing. Lumbar Assessment Lumbar Assessment: Exceptions to [facility] Lumbar Strength Overall Lumbar Strength Comments: Pt demonstrates posterior pelvic tilt during sitting.  Postural Control Postural Control: Deficits on evaluation Postural Limitations: Pt demonstrates forward flexed trunk, protracted shoulders and forward head during gait training.    Balance Balance Balance Assessed: Yes Standardized Balance Assessment Standardized Balance Assessment: Berg Balance Test Berg Balance Test Sit to Stand: Able to stand  independently using hands Standing Unsupported: Able to stand 2 minutes with supervision Sitting with Back Unsupported but Feet Supported on Floor or Stool: Able to sit safely and securely 2 minutes  Stand to Sit: Sits safely with minimal use of hands Transfers: Able to transfer safely, definite need of hands Standing Unsupported with Eyes Closed: Able to stand 10 seconds with supervision Standing Ubsupported with Feet Together: Able to place feet together independently and stand for 1 minute with supervision From Standing, Reach Forward with Outstretched Arm: Can reach forward >12 cm safely (5") From Standing Position, Pick up Object from Floor: Able to pick up shoe, needs supervision From Standing Position, Turn to Look Behind Over each Shoulder: Looks behind one side only/other side shows less weight shift Turn 360 Degrees: Needs close supervision or verbal cueing Standing Unsupported, Alternately Place Feet on Step/Stool: Needs assistance to keep from falling or unable to try Standing Unsupported, One Foot in Front: Loses balance while stepping or standing Standing on One Leg: Unable to try or needs assist to prevent fall Total Score: 33 Static Sitting Balance Static Sitting - Balance Support: Feet supported;Bilateral upper extremity supported Static  Sitting - Level of Assistance: 5: Stand by assistance Dynamic Sitting Balance Dynamic Sitting - Balance Support: Feet supported;During functional activity Dynamic Sitting - Level of Assistance: 5: Stand by assistance Static Standing Balance Static Standing - Balance Support: During functional activity Static Standing - Level of Assistance: 4: Min assist Dynamic Standing Balance Dynamic Standing - Balance Support: During functional activity Dynamic Standing - Level of Assistance: 4: Min assist Dynamic Standing - Balance Activities: Reaching for objects Dynamic Standing - Comments: Pt able to adjust brief prior to toileting during session with light min assist for steadying.  Extremity/Trunk Assessment RUE Assessment RUE Assessment: Within Functional Limits LUE Assessment LUE Assessment: Exceptions to [facility] LUE Strength LUE Overall Strength Comments: Pt with Brunnstrum stage V in the left hand and arm.  AROM shouilder flexion 0-120 degrees with isolated movement at the shoulder, elbow, and digits.  He is able to oppose the thumb to all digits but decreased ability manipulating small objects.  Slight ataxia noted as well.  FIM:  FIM - Eating Eating Activity: 6: Swallowing techniques: self-managed FIM - Grooming Grooming Steps: Wash, rinse, dry face;Wash, rinse, dry hands;Oral care, brush teeth, clean dentures;Brush, comb hair;Shave or apply make-up Grooming: 5: Supervision: safety issues or verbal cues FIM - Bathing Bathing Steps Patient Completed: Chest;Right Arm;Left Arm;Abdomen;Front perineal area;Buttocks;Right upper leg;Left lower leg (including foot);Right lower leg (including foot);Left upper leg Bathing: 5: Supervision: Safety issues/verbal cues FIM - Upper Body Dressing/Undressing Upper body dressing/undressing steps patient completed: Thread/unthread right sleeve of pullover shirt/dresss;Thread/unthread left sleeve of pullover shirt/dress;Put head through opening of pull over  shirt/dress;Pull shirt over trunk Upper body dressing/undressing: 5: Supervision: Safety issues/verbal cues FIM - Lower Body Dressing/Undressing Lower body dressing/undressing steps patient completed: Thread/unthread right underwear leg;Thread/unthread left underwear leg;Pull underwear up/down;Thread/unthread right pants leg;Thread/unthread left pants leg;Pull pants up/down;Don/Doff right shoe;Fasten/unfasten left shoe;Fasten/unfasten right shoe;Don/Doff left shoe Lower body dressing/undressing: 4: Min-Patient completed 75 plus % of tasks FIM - Toileting Toileting steps completed by patient: Performs perineal hygiene;Adjust clothing prior to toileting;Adjust clothing after toileting Toileting: 4: Steadying assist FIM - Bed/Chair Transfer Bed/Chair Transfer: 5: Supine > Sit: Supervision (verbal cues/safety issues);5: Sit > Supine: Supervision (verbal cues/safety issues);4: Bed > Chair or W/C: Min A (steadying Pt. > 75%);4: Chair or W/C > Bed: Min A (steadying Pt. > 75%) FIM - Radio producer Devices: Elevated toilet seat;Grab bars Toilet Transfers: 4-To toilet/BSC: Min A (steadying Pt. > 75%);4-From toilet/BSC: Min A (steadying Pt. > 75%)   Refer to Care Plan for Long  Term Goals  Recommendations for other services: None  Discharge Criteria: Patient will be discharged from OT if patient refuses treatment 3 consecutive times without medical reason, if treatment goals not met, if there is a change in medical status, if patient makes no progress towards goals or if patient is discharged from hospital.  The above assessment, treatment plan, treatment alternatives and goals were discussed and mutually agreed upon: by patient Began education on selfcare re-training sit to stand at the sink.  Pt overall min assist level.  Demonstrates decreased coordination and functional use of the LUE, especially with FM tasks.  Cindra Presume OTR/L 12/01/2013, 12:35 PM

## 2013-12-01 NOTE — Progress Notes (Signed)
Social Work Patient ID: Scott Conley, male   DOB: Jan 17, 1941, 73 y.o.   MRN: [id number]  Inpatient RehabilitationTeam Conference and Plan of Care Update Date: 12/01/2013   Time: 11:00 AM     Patient Name: Scott Conley       Medical Record Number: [id number]   Date of Birth: 22-Apr-1941 Sex: Male         Room/Bed: 4W05C/4W05C-01 Payor Info: Payor: MEDICARE / Plan: MEDICARE PART A AND B / Product Type: *No Product type* /   Admitting Diagnosis: CVA   Admit Date/Time:  11/30/2013  1:47 PM Admission Comments: No comment available   Primary Diagnosis:  <principal problem not specified> Principal Problem: <principal problem not specified>    Patient Active Problem List     Diagnosis  Date Noted   .  CVA (cerebral infarction)  11/30/2013   .  Stroke  11/26/2013     Expected Discharge Date: Expected Discharge Date: 12/10/13  Team Members Present: Physician leading conference: Dr. Alysia Penna Social Worker Present: Ovidio Kin, LCSW Nurse Present: Elliot Cousin, RN PT Present: Cameron Sprang, PT OT Present: Clyda Greener, Artemio Aly, OT SLP Present: Gunnar Fusi, SLP PPS Coordinator present : Ileana Ladd, PT        Current Status/Progress  Goal  Weekly Team Focus   Medical     Diarrhea question etiology, left-sided weakness primarily upper extremity  Maintain medical stability during inpatient rehabilitation stay  Evaluate etiology of diarrhea   Bowel/Bladder     cont of bowel and bladder - 7 loose bm's since 11/30/13 on imodium 4mg  q6 hrs prn, cdiff precautions  decrease loose stools  continue to monitor bowel pattern and bladder   Swallow/Nutrition/ Hydration     eval pending        ADL's            Mobility     min assist overall for transfers, gait and stairs  supervision to Mod I  balance, gait, stairs, pt/family training   Communication     eval pending        Safety/Cognition/ Behavioral Observations    eval pending   Mod I  continue to monitor  safety precautions   Pain     no complaints of pain  less than or equal to 2  continue to monitor   Skin     No skin breakdown  No new skin breakdown this admission  continue to monitor    Rehab Goals Patient on target to meet rehab goals: Yes Rehab Goals Revised: None - pt's first conference *See Care Plan and progress notes for long and short-term goals.    Barriers to Discharge:  See above      Possible Resolutions to Barriers:    See above      Discharge Planning/Teaching Needs:    Pt will return to his home with family support.  Pt will have 24/7 coverage for first week post d/c.   Pt's local dtr can attend family education, as dtr from [place] will not arrive prior to d/c.    Team Discussion:    Pt with diarrhea and is being checked for c-diff and metformin has been d/c'd to see if it was the cause for diarrhea.  Pt is continent of bowel and bladder.  Pt is currently at min assist level now with modified independent goals.  It is recommended that pt receive outpt therapies if family/friends can provide transportation.   Revisions to Treatment Plan:  None    Continued Need for Acute Rehabilitation Level of Care: The patient requires daily medical management by a physician with specialized training in physical medicine and rehabilitation for the following conditions: Daily direction of a multidisciplinary physical rehabilitation program to ensure safe treatment while eliciting the highest outcome that is of practical value to the patient.: Yes Daily medical management of patient stability for increased activity during participation in an intensive rehabilitation regime.: Yes Daily analysis of laboratory values and/or radiology reports with any subsequent need for medication adjustment of medical intervention for : Neurological problems;Other  Silvestre Mesi Fredricka Kohrs 12/01/2013, 2:34 PM

## 2013-12-02 ENCOUNTER — Inpatient Hospital Stay (HOSPITAL_COMMUNITY): Payer: Medicare Other | Admitting: Occupational Therapy

## 2013-12-02 ENCOUNTER — Encounter (HOSPITAL_COMMUNITY): Payer: Medicare Other | Admitting: Occupational Therapy

## 2013-12-02 ENCOUNTER — Inpatient Hospital Stay (HOSPITAL_COMMUNITY): Payer: Medicare Other | Admitting: Speech Pathology

## 2013-12-02 ENCOUNTER — Inpatient Hospital Stay (HOSPITAL_COMMUNITY): Payer: Medicare Other | Admitting: Rehabilitation

## 2013-12-02 LAB — GLUCOSE, CAPILLARY
GLUCOSE-CAPILLARY: 105 mg/dL — AB (ref 70–99)
GLUCOSE-CAPILLARY: 109 mg/dL — AB (ref 70–99)
GLUCOSE-CAPILLARY: 111 mg/dL — AB (ref 70–99)
GLUCOSE-CAPILLARY: 123 mg/dL — AB (ref 70–99)
Glucose-Capillary: 101 mg/dL — ABNORMAL HIGH (ref 70–99)
Glucose-Capillary: 149 mg/dL — ABNORMAL HIGH (ref 70–99)

## 2013-12-02 MED ORDER — INSULIN ASPART 100 UNIT/ML ~~LOC~~ SOLN
0.0000 [IU] | Freq: Three times a day (TID) | SUBCUTANEOUS | Status: DC
  Administered 2013-12-02 – 2013-12-04 (×4): 2 [IU] via SUBCUTANEOUS
  Administered 2013-12-05: 3 [IU] via SUBCUTANEOUS
  Administered 2013-12-08: 2 [IU] via SUBCUTANEOUS

## 2013-12-02 MED ORDER — INSULIN ASPART 100 UNIT/ML ~~LOC~~ SOLN: 0.0000 [IU] | Freq: Three times a day (TID) | SUBCUTANEOUS | Status: DC

## 2013-12-02 NOTE — Progress Notes (Signed)
Subjective/Complaints: 73 y.o. right-handed male on no scheduled medications. Independent prior to admission living alone. Presented 11/26/2013 with left-sided weakness and slurred speech. Noted elevated systolic blood pressure in the 200s as well as findings of potassium 6.6. MRI of the brain shows right hemispheric acute infarct. MRA of the head with atherosclerotic type changes. Echocardiogram with ejection fraction of 60% no wall motion abnormalities. Carotid Dopplers with no ICA stenosis. Patient did not receive TPA. Coreg was added for hypertension control. TEE 11/29/2013 had to be aborted before bubble study due to increased respiratory secretions with severe cough. Bedside saline contrast study performed which showed negative for interatrial shunt or embolism. Loop recorder placed 11/29/2013 per Dr. Caryl Comes. Neurology followup maintain on aspirin for CVA prophylaxis   No further loose stools Review of Systems - Negative except feels week Objective: Vital Signs: Blood pressure 156/88, pulse 78, temperature 97.6 F (36.4 C), temperature source Oral, resp. rate 18, height _0  (1.93 m), weight 106.414 kg (234 lb 9.6 oz), SpO2 98.00%. No results found. Results for orders placed during the hospital encounter of 11/30/13 (from the past 72 hour(s))  GLUCOSE, CAPILLARY     Status: Abnormal   Collection Time    11/30/13  4:02 PM      Result Value Ref Range   Glucose-Capillary 119 (*) 70 - 99 mg/dL  CBC WITH DIFFERENTIAL     Status: Abnormal   Collection Time    11/30/13  4:06 PM      Result Value Ref Range   WBC 10.3  4.0 - 10.5 K/uL   RBC 5.64  4.22 - 5.81 MIL/uL   Hemoglobin 15.1  13.0 - 17.0 g/dL   HCT 45.2  39.0 - 52.0 %   MCV 80.1  78.0 - 100.0 fL   MCH 26.8  26.0 - 34.0 pg   MCHC 33.4  30.0 - 36.0 g/dL   RDW 16.2 (*) 11.5 - 15.5 %   Platelets 276  150 - 400 K/uL   Neutrophils Relative % 67  43 - 77 %   Neutro Abs 6.8  1.7 - 7.7 K/uL   Lymphocytes Relative 25  12 - 46 %   Lymphs  Abs 2.6  0.7 - 4.0 K/uL   Monocytes Relative 6  3 - 12 %   Monocytes Absolute 0.6  0.1 - 1.0 K/uL   Eosinophils Relative 2  0 - 5 %   Eosinophils Absolute 0.2  0.0 - 0.7 K/uL   Basophils Relative 0  0 - 1 %   Basophils Absolute 0.0  0.0 - 0.1 K/uL  COMPREHENSIVE METABOLIC PANEL     Status: Abnormal   Collection Time    11/30/13  4:06 PM      Result Value Ref Range   Sodium 139  137 - 147 mEq/L   Potassium 4.0  3.7 - 5.3 mEq/L   Chloride 101  96 - 112 mEq/L   CO2 22  19 - 32 mEq/L   Glucose, Bld 116 (*) 70 - 99 mg/dL   BUN 27 (*) 6 - 23 mg/dL   Creatinine, Ser 1.13  0.50 - 1.35 mg/dL   Calcium 9.8  8.4 - 10.5 mg/dL   Total Protein 8.1  6.0 - 8.3 g/dL   Albumin 3.9  3.5 - 5.2 g/dL   AST 34  0 - 37 U/L   ALT 28  0 - 53 U/L   Alkaline Phosphatase 78  39 - 117 U/L   Total Bilirubin 0.8  0.3 - 1.2 mg/dL   GFR calc non Af Amer 62 (*) >90 mL/min   GFR calc Af Amer 72 (*) >90 mL/min   Comment: (NOTE)     The eGFR has been calculated using the CKD EPI equation.     This calculation has not been validated in all clinical situations.     eGFR's persistently <90 mL/min signify possible Chronic Kidney     Disease.  GLUCOSE, CAPILLARY     Status: Abnormal   Collection Time    11/30/13  8:48 PM      Result Value Ref Range   Glucose-Capillary 110 (*) 70 - 99 mg/dL  GLUCOSE, CAPILLARY     Status: Abnormal   Collection Time    12/01/13 12:08 AM      Result Value Ref Range   Glucose-Capillary 114 (*) 70 - 99 mg/dL  GLUCOSE, CAPILLARY     Status: Abnormal   Collection Time    12/01/13  4:14 AM      Result Value Ref Range   Glucose-Capillary 121 (*) 70 - 99 mg/dL  COMPREHENSIVE METABOLIC PANEL     Status: Abnormal   Collection Time    12/01/13  5:08 AM      Result Value Ref Range   Sodium 138  137 - 147 mEq/L   Potassium 3.7  3.7 - 5.3 mEq/L   Chloride 101  96 - 112 mEq/L   CO2 20  19 - 32 mEq/L   Glucose, Bld 123 (*) 70 - 99 mg/dL   BUN 29 (*) 6 - 23 mg/dL   Creatinine, Ser 1.03   0.50 - 1.35 mg/dL   Calcium 9.0  8.4 - 10.5 mg/dL   Total Protein 7.5  6.0 - 8.3 g/dL   Albumin 3.5  3.5 - 5.2 g/dL   AST 32  0 - 37 U/L   ALT 26  0 - 53 U/L   Alkaline Phosphatase 73  39 - 117 U/L   Total Bilirubin 0.8  0.3 - 1.2 mg/dL   GFR calc non Af Amer 69 (*) >90 mL/min   GFR calc Af Amer 81 (*) >90 mL/min   Comment: (NOTE)     The eGFR has been calculated using the CKD EPI equation.     This calculation has not been validated in all clinical situations.     eGFR's persistently <90 mL/min signify possible Chronic Kidney     Disease.  GLUCOSE, CAPILLARY     Status: Abnormal   Collection Time    12/01/13  7:31 AM      Result Value Ref Range   Glucose-Capillary 126 (*) 70 - 99 mg/dL  GLUCOSE, CAPILLARY     Status: Abnormal   Collection Time    12/01/13 11:57 AM      Result Value Ref Range   Glucose-Capillary 130 (*) 70 - 99 mg/dL  GLUCOSE, CAPILLARY     Status: None   Collection Time    12/01/13  4:22 PM      Result Value Ref Range   Glucose-Capillary 97  70 - 99 mg/dL  GLUCOSE, CAPILLARY     Status: Abnormal   Collection Time    12/01/13  8:07 PM      Result Value Ref Range   Glucose-Capillary 107 (*) 70 - 99 mg/dL  GLUCOSE, CAPILLARY     Status: Abnormal   Collection Time    12/02/13 12:06 AM      Result Value Ref Range  Glucose-Capillary 109 (*) 70 - 99 mg/dL  GLUCOSE, CAPILLARY     Status: Abnormal   Collection Time    12/02/13  4:14 AM      Result Value Ref Range   Glucose-Capillary 111 (*) 70 - 99 mg/dL     HEENT: normal Cardio: RRR and no murmurs Resp: CTA B/L and unlabored GI: BS positive and NT,ND Extremity:  No Edema Skin:   Intact Neuro: Alert/Oriented, Cranial Nerve II-XII normal, Normal Sensory, Abnormal Motor 4- Left denltoid , bi, tri,grip, 4/5 B HF, KE, ADF, 5/5  RUE and Abnormal FMC Ataxic/ dec FMC Musc/Skel:  Normal Gen NAD   Assessment/Plan: 1. Functional deficits secondary to Right embolic MCA infarct which require 3+ hours per day  of interdisciplinary therapy in a comprehensive inpatient rehab setting. Physiatrist is providing close team supervision and 24 hour management of active medical problems listed below. Physiatrist and rehab team continue to assess barriers to discharge/monitor patient progress toward functional and medical goals. FIM: FIM - Bathing Bathing Steps Patient Completed: Chest;Right Arm;Left Arm;Abdomen;Front perineal area;Buttocks;Right upper leg;Left lower leg (including foot);Right lower leg (including foot);Left upper leg Bathing: 5: Supervision: Safety issues/verbal cues  FIM - Upper Body Dressing/Undressing Upper body dressing/undressing steps patient completed: Thread/unthread right sleeve of pullover shirt/dresss;Thread/unthread left sleeve of pullover shirt/dress;Put head through opening of pull over shirt/dress;Pull shirt over trunk Upper body dressing/undressing: 5: Supervision: Safety issues/verbal cues FIM - Lower Body Dressing/Undressing Lower body dressing/undressing steps patient completed: Thread/unthread right underwear leg;Thread/unthread left underwear leg;Pull underwear up/down;Thread/unthread right pants leg;Thread/unthread left pants leg;Pull pants up/down;Don/Doff right shoe;Fasten/unfasten left shoe;Fasten/unfasten right shoe;Don/Doff left shoe Lower body dressing/undressing: 4: Min-Patient completed 75 plus % of tasks  FIM - Toileting Toileting steps completed by patient: Adjust clothing prior to toileting;Performs perineal hygiene;Adjust clothing after toileting Toileting Assistive Devices: Grab bar or rail for support Toileting: 5: Supervision: Safety issues/verbal cues  FIM - Radio producer Devices: Elevated toilet seat;Grab bars Toilet Transfers: 4-To toilet/BSC: Min A (steadying Pt. > 75%);4-From toilet/BSC: Min A (steadying Pt. > 75%)  FIM - Bed/Chair Transfer Bed/Chair Transfer: 5: Supine > Sit: Supervision (verbal cues/safety issues);5:  Sit > Supine: Supervision (verbal cues/safety issues);4: Bed > Chair or W/C: Min A (steadying Pt. > 75%);4: Chair or W/C > Bed: Min A (steadying Pt. > 75%)  FIM - Locomotion: Wheelchair Distance: 100 Locomotion: Wheelchair: 2: Travels 50 - 149 ft with supervision, cueing or coaxing FIM - Locomotion: Ambulation Locomotion: Ambulation Assistive Devices: Walker - Rolling;Other (comment) (HHA and with RW) Ambulation/Gait Assistance: 4: Min assist Locomotion: Ambulation: 2: Travels 50 - 149 ft with minimal assistance (Pt.>75%)  Comprehension Comprehension Mode: Auditory Comprehension: 6-Follows complex conversation/direction: With extra time/assistive device  Expression Expression Mode: Verbal Expression: 7-Expresses complex ideas: With no assist  Social Interaction Social Interaction: 6-Interacts appropriately with others with medication or extra time (anti-anxiety, antidepressant).  Problem Solving Problem Solving: 6-Solves complex problems: With extra time  Memory Memory: 6-Assistive device: No helper  Medical Problem List and Plan:  1. Functional deficits secondary to right MCA infarct felt to be embolic.  2. DVT Prophylaxis/Anticoagulation: Subcutaneous Lovenox. Monitor platelet counts and any signs of bleeding  3. Pain Management: Tylenol as needed  5. Neuropsych: This patient is capable of making decisions on his own behalf.  6. Hypertension. Coreg 6.25 mg twice a day. Monitor with increased mobility and adjust appropriately. Has had high readings over last 24 hours  7. Newly diagnosed diabetes mellitus. Hemoglobin A1c 7.4. Glucophage 500 mg  daily. Provide diabetic teaching. Check blood sugars a.c. and at bedtime. Adjust regimen as needed for tighter control  8. Hyperlipidemia. Lipitor  9.  Diarrhea, resolved off metformin, stool for C diff pnd  LOS (Days) 2 A FACE TO FACE EVALUATION WAS PERFORMED  Charlett Blake 12/02/2013, 7:21 AM

## 2013-12-02 NOTE — Progress Notes (Signed)
Physical Therapy Session Note  Patient Details  Name: Scott Conley MRN: [id number] Date of Birth: 03-Oct-1940  Today's Date: 12/02/2013 Time: 1102-1200 Time Calculation (min): 58 min  Short Term Goals: Week 1:  PT Short Term Goal 1 (Week 1): =LTG's due to ELOS  Skilled Therapeutic Interventions/Progress Updates:   Pt received sitting in w/c in room, agreeable to therapy.  Pt ambulated to/from therapy gym with RW at min assist.  Note slight improvement with use of RW today as well as posture.  PT would provide intermittent cues for upright posture and pt would correct on his own at other times.  Also provided cues for keeping inside of RW with turns and when backing up to chair.  Once in therapy gym, performed seated nustep x 6 mins with BLEs only to increase NMR through LLE.  Pt able to keep pace above 40 steps per minute.  Transitioned over to therapy mat in order to work on trunk dissociation and trunk shortening/lengthening.  Elevated mat so feet not supported to further increase use of trunk while reaching for objects.  Also worked on retrieving cards from under hips to work on trunk shortening.  Worked on [employer] through stepping RLE to/from 6" step with and without UE support at min/guard assist with cues for upright posture and increased L knee extension.  Worked on high level gait/balance activities while performing forwards/backwards and side to side gait without use of AD with min assist (B hand held assist from therapist) as well as gait while kicking yoga block to work on weight shifts and WB through LLE, as well as hip abd strength.  Ended session with gait back to room as stated above.  Left in recliner with all needs in reach.    Therapy Documentation Precautions:  Precautions Precautions: Fall Precaution Comments: LUE and LLE mild hemiparesis Restrictions Weight Bearing Restrictions: No   Pain: Pain Assessment Pain Assessment: No/denies pain Ambulation Ambulation/Gait  Assistance: 4: Min assist   See FIM for current functional status  Therapy/Group: Individual Therapy  Raquel Sarna A Kashlyn Salinas 12/02/2013, 12:35 PM

## 2013-12-02 NOTE — Progress Notes (Signed)
Social Work Patient ID: Scott Conley, male   DOB: April 19, 1941, 73 y.o.   MRN: [id number]  CSW met with pt and spoke with pt's dtr, Scott Conley, via telephone to update them on team conference discussion.  Pt was pleased to learn his d/c date would be next week and dtr will begin to make her arrangements to travel here from [place].  Pt's dtr who is local, Scott Conley, will assist with pt's d/c on Friday and be with pt until Scott Conley's arrival.  Scott Conley told CSW she will communicate the outcome of team conference to her sister, Scott Conley, and they will coordinate plans.  Pt is appreciative of rehab care and is willing to stay and work hard until his d/c date.  Pt feels he can arrange friends and family for transportation to outpatient therapies.  CSW encouraged him to begin checking with people to make sure, otherwise, CSW will arrange [facility].  No questions or concerns noted at this time.

## 2013-12-02 NOTE — Progress Notes (Signed)
Occupational Therapy Session Note  Patient Details  Name: Scott Conley MRN: [id number] Date of Birth: September 29, 1940  Today's Date: 12/02/2013 Time: 0903-1000 Time Calculation (min): 57 min  Short Term Goals: Week 1:  OT Short Term Goal 1 (Week 1): Pt will perform all bathing with modified independence sit to stand. OT Short Term Goal 2 (Week 1): Pt will perform all dressing sit to stand with min assist.  OT Short Term Goal 3 (Week 1): Pt will perform toilet transfers with modified independence using 3:1. OT Short Term Goal 4 (Week 1): Pt will perform tub/shower transfer with modified independence. OT Short Term Goal 5 (Week 1): Pt will perform LUE FM and gross motor exercises with independence in order to increase functional use.   Skilled Therapeutic Interventions/Progress Updates:    Pt performed bathing and dressing during am OT session.  He was able to gather his clothing and towels with min assist but does exhibit a flexed posture in his knees and back as well as reaching for objets to help stabilize his balance.  He needed mod instructional cueing to gather all items needed for the bath and had to get up from the sink on 2 occasions to retrieve his shirt and pants as he did not bring them closer to the sink.  All bathing and dressing supervision level.  He used the LUE as a active assist for washing the right side as well as for assisting with removal of the left sock.  Finished with pt working on sitting in his wheelchair and folding towels and washcloths with incorporation of using bilateral UEs.  Had pt just use the LUE for folding washcloths. Therapy Documentation Precautions:  Precautions Precautions: Fall Precaution Comments: LUE and LLE mild hemiparesis Restrictions Weight Bearing Restrictions: No  Pain: Pain Assessment Pain Assessment: No/denies pain ADL: See FIM for current functional status  Therapy/Group: Individual Therapy  Cindra Presume OTR/L 12/02/2013, 10:00  AM

## 2013-12-02 NOTE — Progress Notes (Signed)
Occupational Therapy Session Note  Patient Details  Name: Scott Conley MRN: [id number] Date of Birth: 01-Apr-1941  Today's Date: 12/02/2013 Time: 7048-8891 Time Calculation (min): 60 min  Skilled Therapeutic Interventions/Progress Updates:    Had pt walk down to the therapy gym using the RW and close supervision.  He demonstrates increased trunk flexion with mobility and needs max instructional cueing to stand closer to the walker and stand up straight.  In the gym had him work in standing and wiping down the upper cabinet facings with the LUE.  He was able to successfully perform but needed multiple rest breaks secondary to fatigue when having to stand and reach up.  O2 sats 94% on room air and HR 95 with standing.  Transitioned to sitting and working on [employer] tasks using the LUE as well.  Had pt work on picking up larger pegs and placing in grid.  Then progressed to picking up checkers one at a time as well.  Eventually worked on Network engineer from palm to fingertips to place in grid.  Increased time and concentration needed to transition checker from palm to fingertips but he was able to perform with 90% accuracy.  Finished by having him work on flipping cards over one at a time.  Increased difficulty with this task and needed use of both hands to keep cards straight in the piles.  Therapy Documentation Precautions:  Precautions Precautions: Fall Precaution Comments: LUE and LLE mild hemiparesis Restrictions Weight Bearing Restrictions: No  Pain: Pain Assessment Pain Assessment: No/denies pain ADL: See FIM for current functional status  Therapy/Group: Individual Therapy  Cindra Presume OTR/L 12/02/2013, 3:31 PM

## 2013-12-03 ENCOUNTER — Inpatient Hospital Stay (HOSPITAL_COMMUNITY): Payer: Medicare Other | Admitting: Rehabilitation

## 2013-12-03 ENCOUNTER — Inpatient Hospital Stay (HOSPITAL_COMMUNITY): Payer: Medicare Other

## 2013-12-03 ENCOUNTER — Inpatient Hospital Stay (HOSPITAL_COMMUNITY): Payer: Medicare Other | Admitting: Occupational Therapy

## 2013-12-03 LAB — GLUCOSE, CAPILLARY
GLUCOSE-CAPILLARY: 98 mg/dL (ref 70–99)
Glucose-Capillary: 114 mg/dL — ABNORMAL HIGH (ref 70–99)
Glucose-Capillary: 123 mg/dL — ABNORMAL HIGH (ref 70–99)
Glucose-Capillary: 98 mg/dL (ref 70–99)

## 2013-12-03 NOTE — Progress Notes (Signed)
Occupational Therapy Session Note  Patient Details  Name: STEDMAN SUMMERVILLE MRN: [id number] Date of Birth: Oct 25, 1940  Today's Date: 12/03/2013 Time: 1886-7737 Time Calculation (min): 45 min  Skilled Therapeutic Interventions/Progress Updates:    Pt walked down to the therapy gym using the RW and close supervision.  He demonstrates increased trunk flexion with mobility and needs max instructional cueing to stand closer to the walker and stand up straight.  Pt completed BUE strengthen exercises using 2#; shoulder and elbow ranges; 1x12. Increased fatigue with use of LUE. Rest breaks when needed. Patient completed fine motor coordination task using resistive clothespins with LUE to increase pinch strength and hand eye coordination. Increased time to complete. Patient completed sitting and standing. Min vc's to only use LUE. Patient self corrected on several occasions.  Pt walked back to room with RW at close supervision. Used toilet to urinate standing with distant supervision. Pt left in room sitting in w/c with call light within reach. Therapy Documentation Precautions:  Precautions Precautions: Fall Precaution Comments: LUE and LLE mild hemiparesis Restrictions Weight Bearing Restrictions: No  Pain: Pain Assessment Pain Assessment: No/denies pain ADL: See FIM for current functional status  Therapy/Group: Individual Therapy  Clarene Duke Kindal Ponti OTR/L 12/03/2013, 1:56 PM

## 2013-12-03 NOTE — Progress Notes (Signed)
Occupational Therapy Session Note  Patient Details  Name: Scott Conley MRN: [id number] Date of Birth: 02-11-1941  Today's Date: 12/03/2013 Time: 1000-1100 Time Calculation (min): 60 min  Short Term Goals: Week 1:  OT Short Term Goal 1 (Week 1): Pt will perform all bathing with modified independence sit to stand. OT Short Term Goal 2 (Week 1): Pt will perform all dressing sit to stand with min assist.  OT Short Term Goal 3 (Week 1): Pt will perform toilet transfers with modified independence using 3:1. OT Short Term Goal 4 (Week 1): Pt will perform tub/shower transfer with modified independence. OT Short Term Goal 5 (Week 1): Pt will perform LUE FM and gross motor exercises with independence in order to increase functional use.   Skilled Therapeutic Interventions/Progress Updates: ADL-retraining with focus on functional use of LUE, sit><stand, dynamic standing balance.   Patient received in bed receptive for planned ADL.   Patient completed bathing at shower level, ambulating with RW with min verbal cues to stand upright and only supervision for transfer.   Patient completed bath unassisted, standing to wash buttocks and peri-area with standby assist for safety.  Patient returned to edge of bed to dress and required only 1 reminder to dress left LE first after brief struggle with lower body dressing.   Patient groomed at sink in w/c, brushing his teeth and shaving with only setup assist.   No LOB noted during mobility, standing, and transfers.  Patient left in w/c awaiting physical therapist with call light within reach.  Therapy Documentation Precautions:  Precautions Precautions: Fall Precaution Comments: LUE and LLE mild hemiparesis Restrictions Weight Bearing Restrictions: No  Vital Signs: Therapy Vitals Temp: 97.3 F (36.3 C) Temp src: Oral Pulse Rate: 69 Resp: 19 BP: 144/51 mmHg Patient Position, if appropriate: Sitting Oxygen Therapy SpO2: 96 % O2 Device: None (Room  air) Pain: Pain Assessment Pain Assessment: No/denies pain  See FIM for current functional status  Therapy/Group: Individual Therapy  Salome Spotted 12/03/2013, 3:26 PM

## 2013-12-03 NOTE — Progress Notes (Signed)
Physical Therapy Session Note  Patient Details  Name: Scott Conley MRN: [id number] Date of Birth: 1941/05/07  Today's Date: 12/03/2013 Time:  -     Short Term Goals: Week 1:  PT Short Term Goal 1 (Week 1): =LTG's due to ELOS  Skilled Therapeutic Interventions/Progress Updates:   Pt received sitting in w/c in room, agreeable to therapy session.  Performed gait training to/from therapy gym with RW at min/guard to close supervision level with continued cues for upright posture (again, he is able to intermittently correct on his own), increased step width, and also increased DF on LLE.  Took seated rest break in therapy gym before continuing to ortho gym.  Focus of session was gait while on LiteGait body weight support treadmill system.  Performed total of 6 mins x 419' at 1 mph.  Broke session into 2 min increments to decrease fatigue with one seated rest break.  During first two trials, assist with weight shifting at hips intermittently and also assisted LLE for forward progression and to perform heel strike instead of foot flat.  Pt also cued to do the same on RLE, as he tends to walk with feet flat B.  Last rep, placed small physioball between pts stomach and LiteGait system in order to increase forward protraction of hips and increased glute activation.  Pt able to do with continued cuing and motivation from therapist, however did drop ball on two occasions.  Ended session with gait in hallway without AD to determine carryover from treadmill system.  Performed HHA x 100' x 2 reps with therapist assisting with increased gait speed.  Pt shows marked improvement with maintaining upright posture when gait speed is increased.  Also trialed use of Lite AFO on LLE, however continued to note occasional foot drag and feel that it is more an awareness issue than pure motor.  Pt ambulated back to room and left in w/c with all needs in reach.    Therapy Documentation Precautions:  Precautions Precautions:  Fall Precaution Comments: LUE and LLE mild hemiparesis Restrictions Weight Bearing Restrictions: No   Vital Signs: Therapy Vitals Temp: 98.2 F (36.8 C) Temp src: Oral Pulse Rate: 70 Resp: 18 BP: 142/75 mmHg Patient Position, if appropriate: Lying Oxygen Therapy SpO2: 97 % Pain: No c/o pain during PT session.   See FIM for current functional status  Therapy/Group: Individual Therapy  Raquel Sarna A Chianne Byrns 12/03/2013, 8:06 AM

## 2013-12-03 NOTE — IPOC Note (Signed)
Overall Plan of Care [facility]) Patient Details Name: Scott Conley MRN: [id number] DOB: [birth year]  Admitting Diagnosis: CVA  Hospital Problems: Active Problems:   CVA (cerebral infarction)     Functional Problem List: Nursing Bladder;Bowel;Medication Management;Nutrition;Perception;Safety  PT Balance;Endurance;Motor;Safety  OT Balance;Motor;Safety;Sensory  SLP    TR         Basic ADL's: OT Eating;Bathing;Dressing;Toileting     Advanced  ADL's: OT Simple Meal Preparation     Transfers: PT Bed Mobility;Bed to Chair;Car;Furniture;Floor  OT Toilet;Tub/Shower     Locomotion: PT Ambulation;Stairs     Additional Impairments: OT Fuctional Use of Upper Extremity  SLP        TR      Anticipated Outcomes Item Anticipated Outcome  Self Feeding modified independent  Swallowing      Basic self-care  modified independent  Toileting  modified independent   Bathroom Transfers modified independent  Bowel/Bladder  mod I  Transfers  Mod I  Locomotion  supervision  Communication     Cognition     Pain  n/a  Safety/Judgment  mod I   Therapy Plan: PT Intensity: Minimum of 1-2 x/day ,45 to 90 minutes PT Frequency: 5 out of 7 days PT Duration Estimated Length of Stay: 10-12 days OT Intensity: Minimum of 1-2 x/day, 45 to 90 minutes OT Frequency: 5 out of 7 days OT Duration/Estimated Length of Stay: 10 -12 days         Team Interventions: Nursing Interventions Patient/Family Education;Bladder Management;Bowel Management;Disease Management/Prevention;Medication Management;Discharge Planning;Psychosocial Support  PT interventions Ambulation/gait training;Balance/vestibular training;Discharge planning;DME/adaptive equipment instruction;Functional electrical stimulation;Functional mobility training;Neuromuscular re-education;Pain management;Patient/family education;Stair training;Therapeutic Activities;Therapeutic Exercise;UE/LE Strength taining/ROM;UE/LE Coordination  activities;Wheelchair propulsion/positioning  OT Interventions Balance/vestibular training;Community reintegration;Discharge planning;Functional mobility training;DME/adaptive equipment instruction;Neuromuscular re-education;Patient/family education;Therapeutic Activities;Splinting/orthotics;Self Care/advanced ADL retraining;Therapeutic Exercise;UE/LE Strength taining/ROM;UE/LE Coordination activities  SLP Interventions    TR Interventions    SW/CM Interventions Discharge Planning;Psychosocial Support;Patient/Family Education    Team Discharge Planning: Destination: PT-Home ,OT- Home , SLP-Home Projected Follow-up: PT-Home health PT;Outpatient PT (HH if can't get to OP), OT-  Outpatient OT, SLP-None Projected Equipment Needs: PT-To be determined, OT- Tub/shower bench;3 in 1 bedside comode, SLP-None recommended by SLP Equipment Details: PT- , OT-  Patient/family involved in discharge planning: PT- Patient,  OT-Patient, SLP-Patient  MD ELOS: 7-10d Medical Rehab Prognosis:  Good Assessment: 73 y.o. right-handed male on no scheduled medications. Independent prior to admission living alone. Presented 11/26/2013 with left-sided weakness and slurred speech. Noted elevated systolic blood pressure in the 200s as well as findings of potassium 6.6. MRI of the brain shows right hemispheric acute infarct. MRA of the head with atherosclerotic type changes.  Now requiring 24/7 Rehab RN,MD, as well as CIR level PT, OT .  Treatment team will focus on ADLs and mobility with goals set at Mod I    See Team Conference Notes for weekly updates to the plan of care

## 2013-12-03 NOTE — Progress Notes (Signed)
Physical Therapy Session Note  Patient Details  Name: Scott Conley MRN: [id number] Date of Birth: 1941-03-25  Today's Date: 12/03/2013 Time: 1530-1600 Time Calculation (min): 30 min  Short Term Goals: Week 1:  PT Short Term Goal 1 (Week 1): =LTG's due to ELOS  Skilled Therapeutic Interventions/Progress Updates:   Pt received sitting in w/c in room, agreeable to therapy.  Pt ambulated to/from therapy gym >150' with RW at supervision level.  Note marked improvement in pts ability to maintain upright posture and keep closer to RW with turns.  Once in therapy gym, performed seated nustep x 7 mins at level 4 resistance with BLEs only to increase NMR through LLE with increased hip ext.  Tolerated well with 2 rest breaks in between due to fatigue.  Ended session with high level balance on balance beam to work on ankle and hip strategies.  Initially requires max assist, however pt then able to stand with min/guard to min assist therefore progressed to head turns side/side and up/down.  Then performing tapping to cones alternating LEs at mod assist initially to min assist.  Pt ambulated back to room as stated above.  All needs in reach.   Therapy Documentation Precautions:  Precautions Precautions: Fall Precaution Comments: LUE and LLE mild hemiparesis Restrictions Weight Bearing Restrictions: No   Vital Signs: Therapy Vitals Temp: 97.3 F (36.3 C) Temp src: Oral Pulse Rate: 69 Resp: 19 BP: 144/51 mmHg Patient Position, if appropriate: Sitting Oxygen Therapy SpO2: 96 % O2 Device: None (Room air) Pain: Pain Assessment Pain Assessment: No/denies pain   Locomotion : Ambulation Ambulation/Gait Assistance: 5: Supervision   See FIM for current functional status  Therapy/Group: Individual Therapy  Rosey Bath Enyah Moman 12/03/2013, 4:43 PM

## 2013-12-03 NOTE — Progress Notes (Signed)
Subjective/Complaints: 73 y.o. right-handed male on no scheduled medications. Independent prior to admission living alone. Presented 11/26/2013 with left-sided weakness and slurred speech. Noted elevated systolic blood pressure in the 200s as well as findings of potassium 6.6. MRI of the brain shows right hemispheric acute infarct. MRA of the head with atherosclerotic type changes. Echocardiogram with ejection fraction of 60% no wall motion abnormalities. Carotid Dopplers with no ICA stenosis. Patient did not receive TPA. Coreg was added for hypertension control. TEE 11/29/2013 had to be aborted before bubble study due to increased respiratory secretions with severe cough. Bedside saline contrast study performed which showed negative for interatrial shunt or embolism. Loop recorder placed 11/29/2013 per Dr. Caryl Comes. Neurology followup maintain on aspirin for CVA prophylaxis   No further loose stools Review of Systems - Negative except feels week Objective: Vital Signs: Blood pressure 142/75, pulse 70, temperature 98.2 F (36.8 C), temperature source Oral, resp. rate 18, height _0  (1.93 m), weight 106.414 kg (234 lb 9.6 oz), SpO2 97.00%. No results found. Results for orders placed during the hospital encounter of 11/30/13 (from the past 72 hour(s))  GLUCOSE, CAPILLARY     Status: Abnormal   Collection Time    11/30/13  4:02 PM      Result Value Ref Range   Glucose-Capillary 119 (*) 70 - 99 mg/dL  CBC WITH DIFFERENTIAL     Status: Abnormal   Collection Time    11/30/13  4:06 PM      Result Value Ref Range   WBC 10.3  4.0 - 10.5 K/uL   RBC 5.64  4.22 - 5.81 MIL/uL   Hemoglobin 15.1  13.0 - 17.0 g/dL   HCT 45.2  39.0 - 52.0 %   MCV 80.1  78.0 - 100.0 fL   MCH 26.8  26.0 - 34.0 pg   MCHC 33.4  30.0 - 36.0 g/dL   RDW 16.2 (*) 11.5 - 15.5 %   Platelets 276  150 - 400 K/uL   Neutrophils Relative % 67  43 - 77 %   Neutro Abs 6.8  1.7 - 7.7 K/uL   Lymphocytes Relative 25  12 - 46 %   Lymphs  Abs 2.6  0.7 - 4.0 K/uL   Monocytes Relative 6  3 - 12 %   Monocytes Absolute 0.6  0.1 - 1.0 K/uL   Eosinophils Relative 2  0 - 5 %   Eosinophils Absolute 0.2  0.0 - 0.7 K/uL   Basophils Relative 0  0 - 1 %   Basophils Absolute 0.0  0.0 - 0.1 K/uL  COMPREHENSIVE METABOLIC PANEL     Status: Abnormal   Collection Time    11/30/13  4:06 PM      Result Value Ref Range   Sodium 139  137 - 147 mEq/L   Potassium 4.0  3.7 - 5.3 mEq/L   Chloride 101  96 - 112 mEq/L   CO2 22  19 - 32 mEq/L   Glucose, Bld 116 (*) 70 - 99 mg/dL   BUN 27 (*) 6 - 23 mg/dL   Creatinine, Ser 1.13  0.50 - 1.35 mg/dL   Calcium 9.8  8.4 - 10.5 mg/dL   Total Protein 8.1  6.0 - 8.3 g/dL   Albumin 3.9  3.5 - 5.2 g/dL   AST 34  0 - 37 U/L   ALT 28  0 - 53 U/L   Alkaline Phosphatase 78  39 - 117 U/L   Total Bilirubin 0.8  0.3 - 1.2 mg/dL   GFR calc non Af Amer 62 (*) >90 mL/min   GFR calc Af Amer 72 (*) >90 mL/min   Comment: (NOTE)     The eGFR has been calculated using the CKD EPI equation.     This calculation has not been validated in all clinical situations.     eGFR's persistently <90 mL/min signify possible Chronic Kidney     Disease.  GLUCOSE, CAPILLARY     Status: Abnormal   Collection Time    11/30/13  8:48 PM      Result Value Ref Range   Glucose-Capillary 110 (*) 70 - 99 mg/dL  GLUCOSE, CAPILLARY     Status: Abnormal   Collection Time    12/01/13 12:08 AM      Result Value Ref Range   Glucose-Capillary 114 (*) 70 - 99 mg/dL  GLUCOSE, CAPILLARY     Status: Abnormal   Collection Time    12/01/13  4:14 AM      Result Value Ref Range   Glucose-Capillary 121 (*) 70 - 99 mg/dL  COMPREHENSIVE METABOLIC PANEL     Status: Abnormal   Collection Time    12/01/13  5:08 AM      Result Value Ref Range   Sodium 138  137 - 147 mEq/L   Potassium 3.7  3.7 - 5.3 mEq/L   Chloride 101  96 - 112 mEq/L   CO2 20  19 - 32 mEq/L   Glucose, Bld 123 (*) 70 - 99 mg/dL   BUN 29 (*) 6 - 23 mg/dL   Creatinine, Ser 1.03   0.50 - 1.35 mg/dL   Calcium 9.0  8.4 - 10.5 mg/dL   Total Protein 7.5  6.0 - 8.3 g/dL   Albumin 3.5  3.5 - 5.2 g/dL   AST 32  0 - 37 U/L   ALT 26  0 - 53 U/L   Alkaline Phosphatase 73  39 - 117 U/L   Total Bilirubin 0.8  0.3 - 1.2 mg/dL   GFR calc non Af Amer 69 (*) >90 mL/min   GFR calc Af Amer 81 (*) >90 mL/min   Comment: (NOTE)     The eGFR has been calculated using the CKD EPI equation.     This calculation has not been validated in all clinical situations.     eGFR's persistently <90 mL/min signify possible Chronic Kidney     Disease.  GLUCOSE, CAPILLARY     Status: Abnormal   Collection Time    12/01/13  7:31 AM      Result Value Ref Range   Glucose-Capillary 126 (*) 70 - 99 mg/dL  GLUCOSE, CAPILLARY     Status: Abnormal   Collection Time    12/01/13 11:57 AM      Result Value Ref Range   Glucose-Capillary 130 (*) 70 - 99 mg/dL  GLUCOSE, CAPILLARY     Status: None   Collection Time    12/01/13  4:22 PM      Result Value Ref Range   Glucose-Capillary 97  70 - 99 mg/dL  GLUCOSE, CAPILLARY     Status: Abnormal   Collection Time    12/01/13  8:07 PM      Result Value Ref Range   Glucose-Capillary 107 (*) 70 - 99 mg/dL  GLUCOSE, CAPILLARY     Status: Abnormal   Collection Time    12/02/13 12:06 AM      Result Value Ref Range  Glucose-Capillary 109 (*) 70 - 99 mg/dL  GLUCOSE, CAPILLARY     Status: Abnormal   Collection Time    12/02/13  4:14 AM      Result Value Ref Range   Glucose-Capillary 111 (*) 70 - 99 mg/dL  GLUCOSE, CAPILLARY     Status: Abnormal   Collection Time    12/02/13  7:38 AM      Result Value Ref Range   Glucose-Capillary 105 (*) 70 - 99 mg/dL  GLUCOSE, CAPILLARY     Status: Abnormal   Collection Time    12/02/13 11:55 AM      Result Value Ref Range   Glucose-Capillary 123 (*) 70 - 99 mg/dL  GLUCOSE, CAPILLARY     Status: Abnormal   Collection Time    12/02/13  5:12 PM      Result Value Ref Range   Glucose-Capillary 101 (*) 70 - 99 mg/dL   GLUCOSE, CAPILLARY     Status: Abnormal   Collection Time    12/02/13  8:43 PM      Result Value Ref Range   Glucose-Capillary 149 (*) 70 - 99 mg/dL     HEENT: normal Cardio: RRR and no murmurs Resp: CTA B/L and unlabored GI: BS positive and NT,ND Extremity:  No Edema Skin:   Intact Neuro: Alert/Oriented, Cranial Nerve II-XII normal, Normal Sensory, Abnormal Motor 4- Left denltoid , bi, tri,grip, 4/5 B HF, KE, ADF, 5/5  RUE and Abnormal FMC Ataxic/ dec FMC Musc/Skel:  Normal Gen NAD   Assessment/Plan: 1. Functional deficits secondary to Right embolic MCA infarct which require 3+ hours per day of interdisciplinary therapy in a comprehensive inpatient rehab setting. Physiatrist is providing close team supervision and 24 hour management of active medical problems listed below. Physiatrist and rehab team continue to assess barriers to discharge/monitor patient progress toward functional and medical goals. FIM: FIM - Bathing Bathing Steps Patient Completed: Chest;Right Arm;Left Arm;Abdomen;Front perineal area;Buttocks;Right upper leg;Left upper leg Bathing: 5: Supervision: Safety issues/verbal cues  FIM - Upper Body Dressing/Undressing Upper body dressing/undressing steps patient completed: Thread/unthread right sleeve of pullover shirt/dresss;Thread/unthread left sleeve of pullover shirt/dress;Put head through opening of pull over shirt/dress;Pull shirt over trunk Upper body dressing/undressing: 5: Supervision: Safety issues/verbal cues FIM - Lower Body Dressing/Undressing Lower body dressing/undressing steps patient completed: Thread/unthread right underwear leg;Thread/unthread left underwear leg;Pull underwear up/down;Thread/unthread right pants leg;Thread/unthread left pants leg;Pull pants up/down;Don/Doff right shoe;Fasten/unfasten left shoe;Fasten/unfasten right shoe;Don/Doff left shoe;Fasten/unfasten pants Lower body dressing/undressing: 5: Supervision: Safety issues/verbal  cues  FIM - Toileting Toileting steps completed by patient: Adjust clothing prior to toileting;Performs perineal hygiene;Adjust clothing after toileting Toileting Assistive Devices: Grab bar or rail for support Toileting: 5: Supervision: Safety issues/verbal cues  FIM - Radio producer Devices: Elevated toilet seat;Grab bars Toilet Transfers: 4-To toilet/BSC: Min A (steadying Pt. > 75%);4-From toilet/BSC: Min A (steadying Pt. > 75%)  FIM - Bed/Chair Transfer Bed/Chair Transfer: 0: Activity did not occur  FIM - Locomotion: Wheelchair Distance: 100 Locomotion: Wheelchair: 0: Activity did not occur FIM - Locomotion: Ambulation Locomotion: Ambulation Assistive Devices: Administrator Ambulation/Gait Assistance: 4: Min assist Locomotion: Ambulation: 4: Travels 150 ft or more with minimal assistance (Pt.>75%)  Comprehension Comprehension Mode: Auditory Comprehension: 6-Follows complex conversation/direction: With extra time/assistive device  Expression Expression Mode: Verbal Expression: 6-Expresses complex ideas: With extra time/assistive device  Social Interaction Social Interaction: 6-Interacts appropriately with others with medication or extra time (anti-anxiety, antidepressant).  Problem Solving Problem Solving: 6-Solves complex problems: With  extra time  Memory Memory: 6-Assistive device: No helper  Medical Problem List and Plan:  1. Functional deficits secondary to right MCA infarct felt to be embolic.  2. DVT Prophylaxis/Anticoagulation: Subcutaneous Lovenox. Monitor platelet counts and any signs of bleeding  3. Pain Management: Tylenol as needed  5. Neuropsych: This patient is capable of making decisions on his own behalf.  6. Hypertension. Coreg 6.25 mg twice a day. Monitor with increased mobility and adjust appropriately. Has had high readings over last 24 hours  7. Newly diagnosed diabetes mellitus. Hemoglobin A1c 7.4. Off glucophage  due to diarrhea Provide diabetic teaching. Check blood sugars a.c. and at bedtime. CBGs look ok on diet  8. Hyperlipidemia. Lipitor  9.  Diarrhea, resolved off metformin, stool for C diff pnd  LOS (Days) 3 A FACE TO FACE EVALUATION WAS PERFORMED  Scott Conley 12/03/2013, 6:32 AM

## 2013-12-04 ENCOUNTER — Inpatient Hospital Stay (HOSPITAL_COMMUNITY): Payer: Medicare Other | Admitting: Physical Therapy

## 2013-12-04 DIAGNOSIS — I10 Essential (primary) hypertension: Secondary | ICD-10-CM

## 2013-12-04 DIAGNOSIS — I6350 Cerebral infarction due to unspecified occlusion or stenosis of unspecified cerebral artery: Secondary | ICD-10-CM

## 2013-12-04 DIAGNOSIS — E119 Type 2 diabetes mellitus without complications: Secondary | ICD-10-CM

## 2013-12-04 LAB — GLUCOSE, CAPILLARY
GLUCOSE-CAPILLARY: 117 mg/dL — AB (ref 70–99)
GLUCOSE-CAPILLARY: 105 mg/dL — AB (ref 70–99)
Glucose-Capillary: 124 mg/dL — ABNORMAL HIGH (ref 70–99)
Glucose-Capillary: 109 mg/dL — ABNORMAL HIGH (ref 70–99)

## 2013-12-04 NOTE — Progress Notes (Signed)
Patient ID: Scott Conley, male   DOB: May 24, 1941, 73 y.o.   MRN: [id number]  12/04/2013.  Subjective/Complaints:  73 y.o. right-handed male  Independent prior to admission living alone. Presented 11/26/2013 with left-sided weakness and slurred speech. Noted elevated systolic blood pressure in the 200s as well as findings of potassium 6.6. MRI of the brain shows right hemispheric acute infarct. MRA of the head with atherosclerotic type changes. Echocardiogram with ejection fraction of 60% no wall motion abnormalities. Carotid Dopplers with no ICA stenosis. Patient did not receive TPA. Coreg was added for hypertension control. TEE 11/29/2013 had to be aborted before bubble study due to increased respiratory secretions with severe cough. Bedside saline contrast study performed which showed negative for interatrial shunt or embolism. Loop recorder placed 11/29/2013 per Dr. Caryl Comes. Neurology followup maintain on aspirin for CVA prophylaxis   Ambulatory will walker Review of Systems - Negative except feels week Objective:  Patient Vitals for the past 24 hrs:  BP Temp Temp src Pulse Resp SpO2  12/04/13 0553 144/83 mmHg 97.5 F (36.4 C) Oral 64 18 98 %  12/03/13 2210 132/82 mmHg 97.9 F (36.6 C) Oral 75 18 98 %  12/03/13 1406 144/51 mmHg 97.3 F (36.3 C) Oral 69 19 96 %    Intake/Output Summary (Last 24 hours) at 12/04/13 0858 Last data filed at 12/04/13 0556  Gross per 24 hour  Intake    720 ml  Output    400 ml  Net    320 ml    CBG (last 3)   Recent Labs  12/03/13 1626 12/03/13 2141 12/04/13 0704  GLUCAP 98 98 117*     Gen- alert without c/os HEENT negative Neck- no bruits Chest-clear CV- regular Abd- soft and flat Extr- no edema Neuro- slight L sided weakness    Assessment/Plan: 1. Functional deficits secondary to Right embolic MCA infarct which require 3+ hours per day of interdisciplinary therapy in a comprehensive inpatient rehab setting.  2. Hypertension. Coreg  6.25 mg twice a day. Monitor with increased mobility and adjust appropriately. Has had high readings over last 24 hours  3. Newly diagnosed diabetes mellitus. Hemoglobin A1c 7.4. Off glucophage due to diarrhea Provide diabetic teaching. Check blood sugars a.c. and at bedtime. CBGs look ok on diet  4. Hyperlipidemia. Lipitor  5.  Diarrhea, resolved off metformin  LOS (Days) 4 A FACE TO FACE EVALUATION WAS PERFORMED  Marletta Lor 12/04/2013, 8:56 AM

## 2013-12-04 NOTE — Progress Notes (Signed)
Physical Therapy Session Note  Patient Details  Name: Scott Conley MRN: [id number] Date of Birth: 27-Apr-1941  Today's Date: 12/04/2013 Time: 4975-3005 Time Calculation (min): 53 min  Short Term Goals: Week 1:  PT Short Term Goal 1 (Week 1): =LTG's due to ELOS  Skilled Therapeutic Interventions/Progress Updates:  Pt was seen bedside in the pm. Performed multiple sit to stand transfers with rolling walker and S. Pt ambulated with rolling walker and 200 feet x 3 with S and occasional verbal cues. Pt ambulated without assistive device about 85 feet with min guard. Pt ambulated with straight cane about 85 feet with min guard. Performed cone taps for LE strengthening. Pt ascended/descended 2 stairs with B rails and S.    Therapy Documentation Precautions:  Precautions Precautions: Fall Precaution Comments: LUE and LLE mild hemiparesis Restrictions Weight Bearing Restrictions: No General:   Pain: No c/o pain.    Locomotion : Ambulation Ambulation/Gait Assistance: 5: Supervision   See FIM for current functional status  Therapy/Group: Individual Therapy  [name] 12/04/2013, 5:20 PM

## 2013-12-05 ENCOUNTER — Inpatient Hospital Stay (HOSPITAL_COMMUNITY): Payer: Medicare Other | Admitting: Physical Therapy

## 2013-12-05 ENCOUNTER — Inpatient Hospital Stay (HOSPITAL_COMMUNITY): Payer: Medicare Other | Admitting: *Deleted

## 2013-12-05 LAB — GLUCOSE, CAPILLARY
GLUCOSE-CAPILLARY: 106 mg/dL — AB (ref 70–99)
GLUCOSE-CAPILLARY: 116 mg/dL — AB (ref 70–99)
GLUCOSE-CAPILLARY: 106 mg/dL — AB (ref 70–99)
Glucose-Capillary: 155 mg/dL — ABNORMAL HIGH (ref 70–99)

## 2013-12-05 NOTE — Progress Notes (Signed)
Occupational Therapy Session Note  Patient Details  Name: Scott Conley MRN: [id number] Date of Birth: [birth year]  Today's Date: 12/05/2013 Time: 0830-0930 Time Calculation (min):60 min  Short Term Goals: Week 1:  OT Short Term Goal 1 (Week 1): Pt will perform all bathing with modified independence sit to stand. OT Short Term Goal 2 (Week 1): Pt will perform all dressing sit to stand with min assist.  OT Short Term Goal 3 (Week 1): Pt will perform toilet transfers with modified independence using 3:1. OT Short Term Goal 4 (Week 1): Pt will perform tub/shower transfer with modified independence. OT Short Term Goal 5 (Week 1): Pt will perform LUE FM and gross motor exercises with independence in order to increase functional use.   Skilled Therapeutic Interventions/Progress Updates:      1st session:  Pt. Lying in bed upon OT arrival.  Ambulated from bed to dresser to retrieve clothes.  Needed cues to position walker close to body when reaching into drawers and closet.  Ambulated to shower and used tub bench to bathe.  Pt. Stood with SBA for peri cleaning.  Dressed self with assistance with jean button due to decreased strength.  Needed assist using LUE to velcro shoes.  Pt. Left in wc with call bell in reach.       Therapy Documentation Precautions:  Precautions Precautions: Fall Precaution Comments: LUE and LLE mild hemiparesis Restrictions Weight Bearing Restrictions: No Pain:  None   Time:1345-1430  (45 min)  2nd session Pain: none Individual session  2nd session Addressed NMRE, functional mobility, balance.  Pt. Ambulated to gym with RW and moderate verbal cues for upright posture.  Sat on EOM and engaged in LUE NMRE using thraband, bad mitten, PNF, and grasping exercises.  Pt stated his arm was fatiqued at end of session but no pain.  Ambulated back to room and left in wc with call bell in reach.        Exercises:  LUE AROM, PNF, activities       See FIM for current  functional status  Therapy/Group: Individual Therapy  Lisa Roca 12/05/2013, 8:53 AM

## 2013-12-05 NOTE — Progress Notes (Signed)
Physical Therapy Session Note  Patient Details  Name: Scott Conley MRN: [id number] Date of Birth: [birth year]  Today's Date: 12/05/2013 Time: 1215-1241 Time Calculation (min): 26 min  Short Term Goals: Week 1:  PT Short Term Goal 1 (Week 1): =LTG's due to ELOS  Skilled Therapeutic Interventions/Progress Updates:  Pt was seen bedside in the pm. Pt  Used to bathroom in the room with rolling walker and S. Pt ambulated with rolling walker 200 feet x 2 and S only. Pt ambulated 100 feet and 200 feet x 2 without assistive device and S to min guard, ambulates with wide BOS, decreased cadence and step length when ambulating without assistive device. No loss of balance while ambulating without assistive device. Pt's states he is more comfortable ambulating with rolling walker.    Therapy Documentation Precautions:  Precautions Precautions: Fall Precaution Comments: LUE and LLE mild hemiparesis Restrictions Weight Bearing Restrictions: No General:   Pain: No c/o pain.    Locomotion : Ambulation Ambulation/Gait Assistance: 5: Supervision   See FIM for current functional status  Therapy/Group: Individual Therapy  [name] 12/05/2013, 2:14 PM

## 2013-12-05 NOTE — Progress Notes (Signed)
Physical Therapy Session Note  Patient Details  Name: Scott Conley MRN: [id number] Date of Birth: 02/02/1941  Today's Date: 12/05/2013 Time: 3225-6720 Time Calculation (min): 55 min  Short Term Goals: Week 1:  PT Short Term Goal 1 (Week 1): =LTG's due to ELOS  Skilled Therapeutic Interventions/Progress Updates:  Pt was seen bedside in the am. Pt transferred sit to stand multiple times with rolling walker and S. Pt ambulated 200 feet x 2 with rolling walker and S. Pt ambulated 40 feet x 2 with rolling walker and S. Pt ambulated 100 feet x 2 without assistive device and S to min guard, wide BOS, decreased cadence, and decreased step length. Pt performed cone taps and criss cross cones for LE strengthening, as well as mini squats. Pt rode Nu-step x 10 minutes at level 4 with 1 rest break at 8 minute mark.   Therapy Documentation Precautions:  Precautions Precautions: Fall Precaution Comments: LUE and LLE mild hemiparesis Restrictions Weight Bearing Restrictions: No General:   Pain: No c/o pain.    Locomotion : Ambulation Ambulation/Gait Assistance: 5: Supervision   See FIM for current functional status  Therapy/Group: Individual Therapy  [name] 12/05/2013, 12:43 PM

## 2013-12-05 NOTE — Progress Notes (Signed)
Patient ID: Scott Conley, male   DOB: Jul 10, 1941, 73 y.o.   MRN: [id number]   Patient ID: Scott Conley, male   DOB: 1940/11/12, 73 y.o.   MRN: [id number]  12/05/2013.  Subjective/Complaints:  73 y.o. right-handed male  Independent prior to admission living alone. Presented 11/26/2013 with left-sided weakness and slurred speech. Noted elevated systolic blood pressure in the 200s as well as findings of potassium 6.6. MRI of the brain shows right hemispheric acute infarct. MRA of the head with atherosclerotic type changes. Echocardiogram with ejection fraction of 60% no wall motion abnormalities. Carotid Dopplers with no ICA stenosis. Patient did not receive TPA. Coreg was added for hypertension control. TEE 11/29/2013 had to be aborted before bubble study due to increased respiratory secretions with severe cough. Bedside saline contrast study performed which showed negative for interatrial shunt or embolism. Loop recorder placed 11/29/2013 per Dr. Caryl Comes. Neurology followup maintain on aspirin for CVA prophylaxis   Ambulatory with walker Review of Systems - no complaints; slept well last night  Past Medical History  Diagnosis Date  . Blood clot in vein   . Arthritis   . Diabetes   . CVA (cerebral infarction)   . Hypertension    Objective:  Patient Vitals for the past 24 hrs:  BP Temp Temp src Pulse Resp SpO2  12/05/13 0558 150/79 mmHg 97.7 F (36.5 C) Oral 63 18 98 %  12/04/13 2146 106/67 mmHg 97.6 F (36.4 C) Oral 64 18 96 %  12/04/13 1413 148/82 mmHg 97.9 F (36.6 C) Oral 67 18 98 %    Intake/Output Summary (Last 24 hours) at 12/05/13 0815 Last data filed at 12/05/13 0559  Gross per 24 hour  Intake    720 ml  Output    500 ml  Net    220 ml    CBG (last 3)   Recent Labs  12/04/13 1615 12/04/13 2051 12/05/13 0645  GLUCAP 105* 109* 106*     Gen- alert without c/os HEENT negative Neck- no bruits Chest-clear CV- regular Abd- soft and flat Extr- no edema Neuro-  slight L sided weakness    Assessment/Plan:  1. Functional deficits secondary to Right embolic MCA infarct which require 3+ hours per day of interdisciplinary therapy in a comprehensive inpatient rehab setting.  2. Hypertension. Coreg 6.25 mg twice a day. Monitor with increased mobility and adjust appropriately. Well controlled 3. Newly diagnosed diabetes mellitus. Hemoglobin A1c 7.4. Off glucophage due to diarrhea Provide diabetic teaching. Check blood sugars a.c. and at bedtime. CBGs well controlled 4. Hyperlipidemia. Lipitor    LOS (Days) 5 A FACE TO FACE EVALUATION WAS PERFORMED  Marletta Lor 12/05/2013, 8:15 AM

## 2013-12-06 ENCOUNTER — Inpatient Hospital Stay (HOSPITAL_COMMUNITY): Payer: Medicare Other | Admitting: Physical Therapy

## 2013-12-06 ENCOUNTER — Inpatient Hospital Stay (HOSPITAL_COMMUNITY): Payer: Medicare Other | Admitting: Rehabilitation

## 2013-12-06 ENCOUNTER — Inpatient Hospital Stay (HOSPITAL_COMMUNITY): Payer: Medicare Other | Admitting: *Deleted

## 2013-12-06 LAB — GLUCOSE, CAPILLARY
GLUCOSE-CAPILLARY: 118 mg/dL — AB (ref 70–99)
Glucose-Capillary: 98 mg/dL (ref 70–99)
Glucose-Capillary: 106 mg/dL — ABNORMAL HIGH (ref 70–99)
Glucose-Capillary: 105 mg/dL — ABNORMAL HIGH (ref 70–99)

## 2013-12-06 NOTE — Progress Notes (Signed)
Physical Therapy Session Note  Patient Details  Name: Scott Conley MRN: [id number] Date of Birth: June 06, 1941  Today's Date: 12/06/2013 Time: 5809-9833 Time Calculation (min): 42 min  Short Term Goals: Week 1:  PT Short Term Goal 1 (Week 1): =LTG's due to ELOS  Skilled Therapeutic Interventions/Progress Updates:    Session focused on increasing gait stability and stability with functional mobility in community environment. Pt performed w/c mobility x120' in controlled and community environments using bilat UE's/LE's with supervision. Transported pt remaining distance to hospital lobby, where pt performed gait x200', x175' in community environment with rolling walker and supervision to min A secondary to posterior walker legs getting caught on rugs in lobby. Discussed use of tennis balls on posterior legs of rolling walker to facilitate safety with negotiation of rugs. During seated rest breaks, pt engaged in interactive discussion of home modifications to decrease risk of falling. Outside of hospital lobby, PT explained, demonstrated technique for negotiating standard curb with rolling walker; pt gave effective return demonstration of curb negotiation with rolling walker and supervision, min cueing for technique. Transported pt back to rehab unit in w/c, where pt performed gait x65' in controlled environment without assistive device requiring close supervision; no overt LOB. Session ended in pt room, where pt was left seated in bedside chair with friend present and all needs within reach.  Therapy Documentation Precautions:  Precautions Precautions: Fall Precaution Comments: LUE and LLE mild hemiparesis Restrictions Weight Bearing Restrictions: No Vital Signs: Therapy Vitals Temp: 98.1 F (36.7 C) Temp src: Oral Pulse Rate: 74 Resp: 18 BP: 150/84 mmHg Patient Position, if appropriate: Sitting Oxygen Therapy SpO2: 96 % O2 Device: None (Room air) Pain: Pain Assessment Pain  Assessment: No/denies pain Locomotion : Ambulation Ambulation/Gait Assistance: 5: Supervision Wheelchair Mobility Distance: 120   See FIM for current functional status  Therapy/Group: Individual Therapy  Malva Cogan Tayana Shankle 12/06/2013, 3:24 PM

## 2013-12-06 NOTE — Progress Notes (Signed)
Physical Therapy Session Note  Patient Details  Name: Scott Conley MRN: [id number] Date of Birth: 1941/06/26  Today's Date: 12/06/2013 Time: 7096-2836 Time Calculation (min): 44 min  Short Term Goals: Week 1:  PT Short Term Goal 1 (Week 1): =LTG's due to ELOS  Skilled Therapeutic Interventions/Progress Updates:   Pt received sitting in recliner in room, agreeable to therapy this afternoon.  Pt ambulated to/from gym with RW at supervision level in controlled and carpeted environment in order to better simulate home environment.  Note slight dragging of LLE, however pt able to correct with min cues.  Once in therapy gym, had pt perform seated nu step x 7 mins at level 5 resistance with BLEs only in order to increase NMR through LLE.  Pt tolerated well with only single rest break.  Progressed to performing toe taps to cones alternating feet then side stepping to increase weight shift and WB through LLE as well as higher level balance.  Pt able to perform with min assist and no overt LOB.  Also had pt push Bosu ball side to side in order to also increase hip abd strength, esp on LLE.  Ambulated to ortho gym in order to perform car transfer.  Pt able to perform at supervision level with min cues for correct technique and safety.  Ambulated back down hallway to stair well where pt ascended/descended 10, 6" steps with single handrail to better prepare pt for community places that may have stairs.  He was able to perform at min assist level with min cues for correct sequencing/technique.  Pt ambulated back to room as stated above.  Left in recliner with all needs in reach.   Therapy Documentation Precautions:  Precautions Precautions: Fall Precaution Comments: LUE and LLE mild hemiparesis Restrictions Weight Bearing Restrictions: No Vital Signs: Therapy Vitals Temp: 98.1 F (36.7 C) Temp src: Oral Pulse Rate: 74 Resp: 18 BP: 150/84 mmHg Patient Position, if appropriate: Sitting Oxygen  Therapy SpO2: 96 % O2 Device: None (Room air) Pain: Pain Assessment Pain Assessment: No/denies pain   Locomotion : Ambulation Ambulation/Gait Assistance: 5: Supervision Wheelchair Mobility Distance: 120   See FIM for current functional status  Therapy/Group: Individual Therapy  Raquel Sarna A Ravneet Spilker 12/06/2013, 4:01 PM

## 2013-12-06 NOTE — Progress Notes (Signed)
Occupational Therapy Session Note  Patient Details  Name: Scott Conley MRN: [id number] Date of Birth: 1941/04/12  Today's Date: 12/06/2013 Time: 5885-0277 Time Calculation (min): 45 min  Short Term Goals: Week 1:  OT Short Term Goal 1 (Week 1): Pt will perform all bathing with modified independence sit to stand. OT Short Term Goal 2 (Week 1): Pt will perform all dressing sit to stand with min assist.  OT Short Term Goal 3 (Week 1): Pt will perform toilet transfers with modified independence using 3:1. OT Short Term Goal 4 (Week 1): Pt will perform tub/shower transfer with modified independence. OT Short Term Goal 5 (Week 1): Pt will perform LUE FM and gross motor exercises with independence in order to increase functional use.   Skilled Therapeutic Interventions/Progress Updates:    Engaged in therapeutic ADL at [place] level.  Pt. Used RW to gather clothes.  Addressed organization, functional mobility, transfers, standing balance, walker safety.  Provided questioning cues for anticipatory awarenss for getting dressed and supplies for shower.  Pt. Followed cues with no problems.  Recalled events from Sunday and asked appropriate questions.  Pt. Showered with bench and stood with Supervision.  Pt. Alerted OT when he was about to stand which he did not on previous occasions.  Questioned pt regarding the safest place to stand when when is donning pants. He followed through with no problems.  Pt. Able to button jeans and donned shoes and velcro.  Pt.left in wc with call button near sink where he was shaving.     Therapy Documentation Precautions:  Precautions Precautions: Fall Precaution Comments: LUE and LLE mild hemiparesis Restrictions Weight Bearing Restrictions: No    Pain: Pain Assessment Pain Assessment: No/denies pain ADL:     See FIM for current functional status  Therapy/Group: Individual Therapy  Lisa Roca 12/06/2013, 8:40 AM

## 2013-12-06 NOTE — Progress Notes (Signed)
Subjective/Complaints: 73 y.o. right-handed male on no scheduled medications. Independent prior to admission living alone. Presented 11/26/2013 with left-sided weakness and slurred speech. Noted elevated systolic blood pressure in the 200s as well as findings of potassium 6.6. MRI of the brain shows right hemispheric acute infarct. MRA of the head with atherosclerotic type changes. Echocardiogram with ejection fraction of 60% no wall motion abnormalities. Carotid Dopplers with no ICA stenosis. Patient did not receive TPA. Coreg was added for hypertension control. TEE 11/29/2013 had to be aborted before bubble study due to increased respiratory secretions with severe cough. Bedside saline contrast study performed which showed negative for interatrial shunt or embolism. Loop recorder placed 11/29/2013 per Dr. Caryl Comes. Neurology followup maintain on aspirin for CVA prophylaxis   No further loose stools Review of Systems - Negative except feels week Objective: Vital Signs: Blood pressure 160/93, pulse 65, temperature 97.6 F (36.4 C), temperature source Oral, resp. rate 18, height 6\' 4"  (1.93 m), weight 106.414 kg (234 lb 9.6 oz), SpO2 99.00%. No results found. Results for orders placed during the hospital encounter of 11/30/13 (from the past 72 hour(s))  GLUCOSE, CAPILLARY     Status: Abnormal   Collection Time    12/03/13 11:50 AM      Result Value Ref Range   Glucose-Capillary 123 (*) 70 - 99 mg/dL   Comment 1 Notify RN    GLUCOSE, CAPILLARY     Status: None   Collection Time    12/03/13  4:26 PM      Result Value Ref Range   Glucose-Capillary 98  70 - 99 mg/dL   Comment 1 Notify RN    GLUCOSE, CAPILLARY     Status: None   Collection Time    12/03/13  9:41 PM      Result Value Ref Range   Glucose-Capillary 98  70 - 99 mg/dL  GLUCOSE, CAPILLARY     Status: Abnormal   Collection Time    12/04/13  7:04 AM      Result Value Ref Range   Glucose-Capillary 117 (*) 70 - 99 mg/dL   Comment 1  Notify RN    GLUCOSE, CAPILLARY     Status: Abnormal   Collection Time    12/04/13 11:29 AM      Result Value Ref Range   Glucose-Capillary 124 (*) 70 - 99 mg/dL   Comment 1 Notify RN    GLUCOSE, CAPILLARY     Status: Abnormal   Collection Time    12/04/13  4:15 PM      Result Value Ref Range   Glucose-Capillary 105 (*) 70 - 99 mg/dL   Comment 1 Notify RN    GLUCOSE, CAPILLARY     Status: Abnormal   Collection Time    12/04/13  8:51 PM      Result Value Ref Range   Glucose-Capillary 109 (*) 70 - 99 mg/dL  GLUCOSE, CAPILLARY     Status: Abnormal   Collection Time    12/05/13  6:45 AM      Result Value Ref Range   Glucose-Capillary 106 (*) 70 - 99 mg/dL  GLUCOSE, CAPILLARY     Status: Abnormal   Collection Time    12/05/13 11:30 AM      Result Value Ref Range   Glucose-Capillary 116 (*) 70 - 99 mg/dL  GLUCOSE, CAPILLARY     Status: Abnormal   Collection Time    12/05/13  4:28 PM      Result Value Ref  Range   Glucose-Capillary 155 (*) 70 - 99 mg/dL   Comment 1 Notify RN    GLUCOSE, CAPILLARY     Status: Abnormal   Collection Time    12/05/13  9:24 PM      Result Value Ref Range   Glucose-Capillary 106 (*) 70 - 99 mg/dL  GLUCOSE, CAPILLARY     Status: Abnormal   Collection Time    12/06/13  7:23 AM      Result Value Ref Range   Glucose-Capillary 118 (*) 70 - 99 mg/dL     HEENT: normal Cardio: RRR and no murmurs Resp: CTA B/L and unlabored GI: BS positive and NT,ND Extremity:  No Edema Skin:   Intact Neuro: Alert/Oriented, Cranial Nerve II-XII normal, Normal Sensory, Abnormal Motor 4- Left denltoid , bi, tri,grip, 4/5 B HF, KE, ADF, 5/5  RUE and Abnormal FMC Ataxic/ dec FMC Musc/Skel:  Normal Gen NAD   Assessment/Plan: 1. Functional deficits secondary to Right embolic MCA infarct which require 3+ hours per day of interdisciplinary therapy in a comprehensive inpatient rehab setting. Physiatrist is providing close team supervision and 24 hour management of active  medical problems listed below. Physiatrist and rehab team continue to assess barriers to discharge/monitor patient progress toward functional and medical goals. FIM: FIM - Bathing Bathing Steps Patient Completed: Chest;Right Arm;Left Arm;Abdomen;Front perineal area;Buttocks;Right upper leg;Left upper leg Bathing: 5: Supervision: Safety issues/verbal cues  FIM - Upper Body Dressing/Undressing Upper body dressing/undressing steps patient completed: Thread/unthread right sleeve of pullover shirt/dresss;Thread/unthread left sleeve of pullover shirt/dress;Put head through opening of pull over shirt/dress;Pull shirt over trunk Upper body dressing/undressing: 5: Supervision: Safety issues/verbal cues FIM - Lower Body Dressing/Undressing Lower body dressing/undressing steps patient completed: Thread/unthread right underwear leg;Thread/unthread left underwear leg;Pull underwear up/down;Thread/unthread right pants leg;Thread/unthread left pants leg;Pull pants up/down;Don/Doff right sock;Don/Doff left sock;Fasten/unfasten left shoe Lower body dressing/undressing: 4: Min-Patient completed 75 plus % of tasks  FIM - Toileting Toileting steps completed by patient: Adjust clothing prior to toileting;Performs perineal hygiene;Adjust clothing after toileting Toileting Assistive Devices: Grab bar or rail for support Toileting: 5: Supervision: Safety issues/verbal cues  FIM - Radio producer Devices: Elevated toilet seat;Grab bars Toilet Transfers: 0-Activity did not occur  FIM - IT sales professional Transfer: 0: Activity did not occur  FIM - Locomotion: Wheelchair Distance: 100 Locomotion: Wheelchair: 0: Activity did not occur FIM - Locomotion: Ambulation Locomotion: Ambulation Assistive Devices: Administrator Ambulation/Gait Assistance: 5: Supervision Locomotion: Ambulation: 5: Travels 150 ft or more with supervision/safety issues  Comprehension Comprehension  Mode: Auditory Comprehension: 6-Follows complex conversation/direction: With extra time/assistive device  Expression Expression Mode: Verbal Expression: 5-Expresses complex 90% of the time/cues < 10% of the time  Social Interaction Social Interaction: 6-Interacts appropriately with others with medication or extra time (anti-anxiety, antidepressant).  Problem Solving Problem Solving: 6-Solves complex problems: With extra time  Memory Memory: 6-Assistive device: No helper  Medical Problem List and Plan:  1. Functional deficits secondary to right MCA infarct felt to be embolic, has loop recorder , may shower with this.  2. DVT Prophylaxis/Anticoagulation: Subcutaneous Lovenox. Monitor platelet counts and any signs of bleeding  3. Pain Management: Tylenol as needed  5. Neuropsych: This patient is capable of making decisions on his own behalf.  6. Hypertension. Coreg 6.25 mg twice a day. Monitor with increased mobility and adjust appropriately. Has had high readings over last 24 hours  7. Newly diagnosed diabetes mellitus. Hemoglobin A1c 7.4. Off glucophage due to diarrhea Provide diabetic  teaching. Check blood sugars a.c. and at bedtime. CBGs look ok on diet  8. Hyperlipidemia. Lipitor  9.  Diarrhea, resolved off metformin, stool for C diff pnd  LOS (Days) 6 A FACE TO FACE EVALUATION WAS PERFORMED  Charlett Blake 12/06/2013, 7:47 AM

## 2013-12-06 NOTE — Progress Notes (Signed)
Physical Therapy Session Note  Patient Details  Name: Scott Conley MRN: [id number] Date of Birth: 1941-01-04  Today's Date: 12/06/2013 Time: 1040-1130 Time Calculation (min): 50 min  Short Term Goals: Week 1:  PT Short Term Goal 1 (Week 1): =LTG's due to ELOS  Skilled Therapeutic Interventions/Progress Updates:   Pt received sitting in w/c in room, agreeable to therapy.  Pt ambulated to/from therapy gym with RW at supervision level.  Note marked improvement in ability to steer RW and maintain position inside of RW, esp with turns.  Min cues for upright posture, however this also is improved from last week.  Once in therapy gym, worked on high level balance on compliant/narrow surfaces while also working on reaching for clothes pins with LUE to work on coordination and fine IT trainer.  Started on balance board in horizontal fashion (rocker board) then progressed to standing on balance beam to further challenge ankle and hip strategy.  Pt did very well with both tasks and only requires up to min/guard for slight steadying.  Progressed to high level gait activity in hallway x 150' with head turns side/side, up/down and also with high gait speed and sudden stops.  Pt able to complete activity without AD with min/guard assist.  Pt ambulated into ortho gym in order to work on floor recovery.  Discussed situations in which he would need to call for 911 vs when attempting to off of floor.  Pt able to perform floor transfer at min assist level.  Provided verbal cues and demonstration on correctly performing transfer.  Ended session with gait back to gym and high level balance while picking up rings from floor.  Pt able to complete at supervision level (close for safety).  Pt ambulated back to room as stated above and left in w/c with all needs in reach.  Friend also in room.    Therapy Documentation Precautions:  Precautions Precautions: Fall Precaution Comments: LUE and LLE mild  hemiparesis Restrictions Weight Bearing Restrictions: No   Pain: no pain during session.    Ambulation Ambulation/Gait Assistance: 5: Supervision   See FIM for current functional status  Therapy/Group: Individual Therapy  Raquel Sarna A Thelma Viana 12/06/2013, 12:21 PM

## 2013-12-07 ENCOUNTER — Inpatient Hospital Stay (HOSPITAL_COMMUNITY): Payer: Medicare Other | Admitting: Rehabilitation

## 2013-12-07 ENCOUNTER — Inpatient Hospital Stay (HOSPITAL_COMMUNITY): Payer: Medicare Other

## 2013-12-07 ENCOUNTER — Encounter (HOSPITAL_COMMUNITY): Payer: Medicare Other | Admitting: Occupational Therapy

## 2013-12-07 ENCOUNTER — Inpatient Hospital Stay (HOSPITAL_COMMUNITY): Payer: Medicare Other | Admitting: *Deleted

## 2013-12-07 DIAGNOSIS — I6330 Cerebral infarction due to thrombosis of unspecified cerebral artery: Secondary | ICD-10-CM

## 2013-12-07 LAB — GLUCOSE, CAPILLARY
GLUCOSE-CAPILLARY: 104 mg/dL — AB (ref 70–99)
Glucose-Capillary: 115 mg/dL — ABNORMAL HIGH (ref 70–99)
Glucose-Capillary: 115 mg/dL — ABNORMAL HIGH (ref 70–99)
Glucose-Capillary: 108 mg/dL — ABNORMAL HIGH (ref 70–99)

## 2013-12-07 LAB — CREATININE, SERUM
Creatinine, Ser: 0.9 mg/dL (ref 0.50–1.35)
Creatinine, Ser: 0.92 mg/dL (ref 0.50–1.35)
GFR calc Af Amer: >90 mL/min (ref >90)
GFR calc non Af Amer: 82 mL/min — ABNORMAL LOW (ref >90)
GFR, EST NON AFRICAN AMERICAN: 81 mL/min — AB (ref >90)

## 2013-12-07 MED ORDER — RAMIPRIL 2.5 MG PO CAPS
2.5000 mg | ORAL_CAPSULE | Freq: Every day | ORAL | Status: DC
  Administered 2013-12-07 – 2013-12-10 (×4): 2.5 mg via ORAL
  Filled 2013-12-07 (×6): qty 1

## 2013-12-07 NOTE — Progress Notes (Signed)
Subjective/Complaints: 73 y.o. right-handed male on no scheduled medications. Independent prior to admission living alone. Presented 11/26/2013 with left-sided weakness and slurred speech. Noted elevated systolic blood pressure in the 200s as well as findings of potassium 6.6. MRI of the brain shows right hemispheric acute infarct. MRA of the head with atherosclerotic type changes. Echocardiogram with ejection fraction of 60% no wall motion abnormalities. Carotid Dopplers with no ICA stenosis. Patient did not receive TPA. Coreg was added for hypertension control. TEE 11/29/2013 had to be aborted before bubble study due to increased respiratory secretions with severe cough. Bedside saline contrast study performed which showed negative for interatrial shunt or embolism. Loop recorder placed 11/29/2013 per Dr. Caryl Comes. Neurology followup maintain on aspirin for CVA prophylaxis   Pt c/o dosing off during the day Review of Systems - Negative except feels week Objective: Vital Signs: Blood pressure 167/71, pulse 65, temperature 97.7 F (36.5 C), temperature source Oral, resp. rate 18, height '6\' 4"'  (1.93 m), weight 106.414 kg (234 lb 9.6 oz), SpO2 96.00%. No results found. Results for orders placed during the hospital encounter of 11/30/13 (from the past 72 hour(s))  GLUCOSE, CAPILLARY     Status: Abnormal   Collection Time    12/04/13 11:29 AM      Result Value Ref Range   Glucose-Capillary 124 (*) 70 - 99 mg/dL   Comment 1 Notify RN    GLUCOSE, CAPILLARY     Status: Abnormal   Collection Time    12/04/13  4:15 PM      Result Value Ref Range   Glucose-Capillary 105 (*) 70 - 99 mg/dL   Comment 1 Notify RN    GLUCOSE, CAPILLARY     Status: Abnormal   Collection Time    12/04/13  8:51 PM      Result Value Ref Range   Glucose-Capillary 109 (*) 70 - 99 mg/dL  GLUCOSE, CAPILLARY     Status: Abnormal   Collection Time    12/05/13  6:45 AM      Result Value Ref Range   Glucose-Capillary 106 (*) 70 -  99 mg/dL  GLUCOSE, CAPILLARY     Status: Abnormal   Collection Time    12/05/13 11:30 AM      Result Value Ref Range   Glucose-Capillary 116 (*) 70 - 99 mg/dL  GLUCOSE, CAPILLARY     Status: Abnormal   Collection Time    12/05/13  4:28 PM      Result Value Ref Range   Glucose-Capillary 155 (*) 70 - 99 mg/dL   Comment 1 Notify RN    GLUCOSE, CAPILLARY     Status: Abnormal   Collection Time    12/05/13  9:24 PM      Result Value Ref Range   Glucose-Capillary 106 (*) 70 - 99 mg/dL  GLUCOSE, CAPILLARY     Status: Abnormal   Collection Time    12/06/13  7:23 AM      Result Value Ref Range   Glucose-Capillary 118 (*) 70 - 99 mg/dL  GLUCOSE, CAPILLARY     Status: None   Collection Time    12/06/13 12:06 PM      Result Value Ref Range   Glucose-Capillary 98  70 - 99 mg/dL  GLUCOSE, CAPILLARY     Status: Abnormal   Collection Time    12/06/13  4:26 PM      Result Value Ref Range   Glucose-Capillary 106 (*) 70 - 99 mg/dL  GLUCOSE, CAPILLARY  Status: Abnormal   Collection Time    12/06/13  8:42 PM      Result Value Ref Range   Glucose-Capillary 105 (*) 70 - 99 mg/dL  CREATININE, SERUM     Status: Abnormal   Collection Time    12/07/13  1:34 AM      Result Value Ref Range   Creatinine, Ser 0.90  0.50 - 1.35 mg/dL   GFR calc non Af Amer 82 (*) >90 mL/min   GFR calc Af Amer >90  >90 mL/min   Comment: (NOTE)     The eGFR has been calculated using the CKD EPI equation.     This calculation has not been validated in all clinical situations.     eGFR's persistently <90 mL/min signify possible Chronic Kidney     Disease.  CREATININE, SERUM     Status: Abnormal   Collection Time    12/07/13  5:35 AM      Result Value Ref Range   Creatinine, Ser 0.92  0.50 - 1.35 mg/dL   GFR calc non Af Amer 81 (*) >90 mL/min   GFR calc Af Amer >90  >90 mL/min   Comment: (NOTE)     The eGFR has been calculated using the CKD EPI equation.     This calculation has not been validated in all  clinical situations.     eGFR's persistently <90 mL/min signify possible Chronic Kidney     Disease.  GLUCOSE, CAPILLARY     Status: Abnormal   Collection Time    12/07/13  7:08 AM      Result Value Ref Range   Glucose-Capillary 104 (*) 70 - 99 mg/dL     HEENT: normal Cardio: RRR and no murmurs Resp: CTA B/L and unlabored GI: BS positive and NT,ND Extremity:  No Edema Skin:   Intact Neuro: Alert/Oriented, Cranial Nerve II-XII normal, Normal Sensory, Abnormal Motor 4- Left denltoid , bi, tri,grip, 4/5 B HF, KE, ADF, 5/5  RUE and Abnormal FMC Ataxic/ dec FMC Musc/Skel:  Normal Gen NAD   Assessment/Plan: 1. Functional deficits secondary to Right embolic MCA infarct which require 3+ hours per day of interdisciplinary therapy in a comprehensive inpatient rehab setting. Physiatrist is providing close team supervision and 24 hour management of active medical problems listed below. Physiatrist and rehab team continue to assess barriers to discharge/monitor patient progress toward functional and medical goals. FIM: FIM - Bathing Bathing Steps Patient Completed: Chest;Right Arm;Left Arm;Abdomen;Front perineal area;Buttocks;Right upper leg;Left upper leg;Right lower leg (including foot);Left lower leg (including foot) Bathing: 5: Supervision: Safety issues/verbal cues  FIM - Upper Body Dressing/Undressing Upper body dressing/undressing steps patient completed: Thread/unthread right sleeve of pullover shirt/dresss;Thread/unthread left sleeve of pullover shirt/dress;Put head through opening of pull over shirt/dress;Pull shirt over trunk Upper body dressing/undressing: 5: Supervision: Safety issues/verbal cues FIM - Lower Body Dressing/Undressing Lower body dressing/undressing steps patient completed: Thread/unthread right underwear leg;Thread/unthread left underwear leg;Pull underwear up/down;Thread/unthread right pants leg;Thread/unthread left pants leg;Pull pants up/down;Don/Doff right  sock;Don/Doff left sock;Fasten/unfasten left shoe;Fasten/unfasten pants;Don/Doff right shoe;Don/Doff left shoe;Fasten/unfasten right shoe Lower body dressing/undressing: 5: Supervision: Safety issues/verbal cues  FIM - Toileting Toileting steps completed by patient: Adjust clothing prior to toileting;Performs perineal hygiene;Adjust clothing after toileting Toileting Assistive Devices: Grab bar or rail for support Toileting: 5: Supervision: Safety issues/verbal cues  FIM - Radio producer Devices: Elevated toilet seat;Grab bars Toilet Transfers: 0-Activity did not occur  FIM - IT sales professional Transfer: 0: Activity did not occur  FIM - Locomotion: Wheelchair Distance: 120 Locomotion: Wheelchair: 0: Activity did not occur FIM - Locomotion: Ambulation Locomotion: Ambulation Assistive Devices: Administrator Ambulation/Gait Assistance: 5: Supervision Locomotion: Ambulation: 5: Travels 150 ft or more with supervision/safety issues  Comprehension Comprehension Mode: Auditory Comprehension: 6-Follows complex conversation/direction: With extra time/assistive device  Expression Expression Mode: Verbal Expression: 6-Expresses complex ideas: With extra time/assistive device  Social Interaction Social Interaction: 7-Interacts appropriately with others - No medications needed.  Problem Solving Problem Solving: 6-Solves complex problems: With extra time  Memory Memory: 6-More than reasonable amt of time  Medical Problem List and Plan:  1. Functional deficits secondary to right MCA infarct felt to be embolic, has loop recorder , may shower with this.  2. DVT Prophylaxis/Anticoagulation: Subcutaneous Lovenox. Monitor platelet counts and any signs of bleeding  3. Pain Management: Tylenol as needed  5. Neuropsych: This patient is capable of making decisions on his own behalf.  6. Hypertension. Coreg 6.25 mg twice a day. Monitor with increased  mobility and adjust appropriately. Add ACE I          7. Newly diagnosed diabetes mellitus. Hemoglobin A1c 7.4. Off glucophage due to diarrhea Provide diabetic teaching. Check blood sugars a.c. and at bedtime. CBGs look ok on diet  8. Hyperlipidemia. Lipitor    LOS (Days) 7 A FACE TO FACE EVALUATION WAS PERFORMED  Charlett Blake 12/07/2013, 7:52 AM

## 2013-12-07 NOTE — Progress Notes (Signed)
Physical Therapy Session Note  Patient Details  Name: Scott Conley MRN: [id number] Date of Birth: 09-12-1940  Today's Date: 12/07/2013 Time: 0930-1026 Time Calculation (min): 56 min  Short Term Goals: Week 1:  PT Short Term Goal 1 (Week 1): =LTG's due to ELOS  Skilled Therapeutic Interventions/Progress Updates:   Pt received sitting in w/c in room, requesting for assist to don TEDS prior to session.  Provided total assist for donning TED hose and also for non skid socks and shoes.  Pt then ambulated to/from therapy gym x 150' x 2 reps without RW in order to further challenge balance, esp in higher traffic areas.  Pt able to ambulate at supervision level with min tactile cues for upright posture intermittently.  Once in therapy gym, worked on high level balance activity in // bars on rocker board horizontally and vertically without UE support performing head turns up/down, side side.  Pt able to maintain balance with min/guard to single min assist during on mild LOB.  Then worked on standing on foam pad while tossing ball/catching ball to/from [employer].  Began with BLEs shoulder width apart>closer>feet together.  He was able to maintain balance throughout with min/guard assist.   Progressed to performing standing balance on Wii Fit program in order to work on weight shifting.  Performed 2 games x 20 mins total with 3 seated rest breaks.  Pt with mild c/o fatigue following session.  Pt ambulated back to room as stated above.  All needs left in reach.    Therapy Documentation Precautions:  Precautions Precautions: Fall Precaution Comments: LUE and LLE mild hemiparesis Restrictions Weight Bearing Restrictions: No Vital Signs:   Pain: No c/o pain this morning.  Ambulation Ambulation/Gait Assistance: 5: Supervision   See FIM for current functional status  Therapy/Group: Individual Therapy  Raquel Sarna A Zuleika Gallus 12/07/2013, 10:29 AM

## 2013-12-07 NOTE — Progress Notes (Signed)
Physical Therapy Session Note  Patient Details  Name: Scott Conley MRN: [id number] Date of Birth: 10-Nov-1940  Today's Date: 12/07/2013 Time: 1525-1550 Time Calculation (min): 25 min  Short Term Goals: Week 1:  PT Short Term Goal 1 (Week 1): =LTG's due to ELOS  Skilled Therapeutic Interventions/Progress Updates:  Pt participated in community gait and mobility 2x200' and 2x~500' with RW and close S over varying and uneven surfaces and inclines.  Pt able to navigate busy settings and tight spaces with S, instructed on side-stepping technique with RW. Pt had no LOB, but needed 2 seated rest breaks due to fatigue. Discussed typical community access and trouble shooting possible difficulties. Performed transfers from varying surfaces and furniture with S. Performed curb step x2 with S and cues for RW management.  Pt left up in room with all needs in reach.       Therapy Documentation Precautions:  Precautions Precautions: Fall Precaution Comments: LUE and LLE mild hemiparesis Restrictions Weight Bearing Restrictions: No General:   Vital Signs:   Pain: Pain Assessment Pain Assessment: No/denies pain Pain Score: 0-No pain Mobility:   Locomotion : Ambulation Ambulation/Gait Assistance: 5: Supervision   See FIM for current functional status  Therapy/Group: Individual Therapy Kennieth Rad, PT, DPT   Soundra Pilon 12/07/2013, 3:58 PM

## 2013-12-07 NOTE — Progress Notes (Signed)
Social Work Patient ID: Scott Conley, male   DOB: May 25, 1941, 73 y.o.   MRN: [id number]  CSW spoke with pt's dtr in [place] who was concerned that pt was under precautions and that visitors needed to wear a gown and gloves.  CSW explained that pt was being tested for c-diff due to diarrhea and this is a precaution to others until the results are known.  CSW followed up with pt's RN, Scott Conley, and learned that MD ruled that diarrhea was due to medications and these have been discontinued and diarrhea has stopped.  CSW relayed this to pt's dtr, Scott Conley, who was pleased to learn this.  She will be coming on 12-11-13 and her sister will come to get pt on 12-10-13 at d/c.  CSW will arrange f/u care as needed and DME as recommended by therapists.

## 2013-12-07 NOTE — Plan of Care (Signed)
Problem: RH KNOWLEDGE DEFICIT Goal: RH STG INCREASE KNOWLEDGE OF DIABETES Patient and family will be able to verbalize understanding of diabetic management and medications and nutrition prior to discharge .  Outcome: Progressing Diabetes management handout provided

## 2013-12-07 NOTE — Progress Notes (Signed)
Occupational Therapy Session Note  Patient Details  Name: Scott Conley MRN: [id number] Date of Birth: 02-14-1941  Today's Date: 12/07/2013 Time: 1102-1200 Time Calculation (min): 58 min  Short Term Goals: Week 1:  OT Short Term Goal 1 (Week 1): Pt will perform all bathing with modified independence sit to stand. OT Short Term Goal 2 (Week 1): Pt will perform all dressing sit to stand with min assist.  OT Short Term Goal 3 (Week 1): Pt will perform toilet transfers with modified independence using 3:1. OT Short Term Goal 4 (Week 1): Pt will perform tub/shower transfer with modified independence. OT Short Term Goal 5 (Week 1): Pt will perform LUE FM and gross motor exercises with independence in order to increase functional use.   Skilled Therapeutic Interventions/Progress Updates:    Pt had already performed bathing and dressing prior to session so had him ambulate down to the ADL apartment to practice tub shower transfers.  Pt able to ambulate with close supervision but demonstrates increased lumbar flexion and increased knee flexion.  He needed mod instructional cueing to maintain upright standing.  He was able to transfer into the shower with min guard assist.  Discussed using the shower seat for safety at home which pt also agreed with, so plan to order.  Transitioned to the gym next and had pt work in quadriped to help increased forced weightbearing over the LUE.  Had pt reach across his body to grasp and place checkers with the RUE while stabilizing his upper body with the LUE.  Needed mod assist to come into tall kneeling and maintain.  Pt transitioned to sitting EOM and worked on bilateral shoulder flexion in sitting while holding a therapy ball.  Once he achieved approximately 140 degrees had him hold it for 3-5 seconds.  Pt with slight decreased ability to maintain left elbow extension.  Had pt work in standing as well on picking up cups with the LUE and placing on top of cabinet.   Attempted to donn 2 lb wrist weight and take cups down but pt unable to tolerate.  After standing for 2 mins and reaching up pt reported fatigue and needed to sit down.  Finished session by having pt pick up small 1" pegs one at a time and place in grid.  Pt able to perform with 75% accuracy.  When attempting to pick up more than one peg at a time and manipulate into the palm pt with frequent drops.  Ambulated back to room with supervision to conclude session.   Therapy Documentation Precautions:  Precautions Precautions: Fall Precaution Comments: LUE and LLE mild hemiparesis Restrictions Weight Bearing Restrictions: No  Pain: Pain Assessment Pain Assessment: No/denies pain ADL: See FIM for current functional status  Therapy/Group: Individual Therapy  Cindra Presume OTR/L 12/07/2013, 12:34 PM

## 2013-12-07 NOTE — Progress Notes (Signed)
Physical Therapy Session Note  Patient Details  Name: Scott Conley MRN: [id number] Date of Birth: [birth year]  Today's Date: 12/07/2013 Time: 3419-3790 Time Calculation (min): 41 min  Short Term Goals: Week 1:  PT Short Term Goal 1 (Week 1): =LTG's due to ELOS  Skilled Therapeutic Interventions/Progress Updates:    Patient received sitting in wheelchair. Session focused on gait training and standing dynamic balance. Gait training in controlled environment >150' x2 without AD and close supervision, intermittent min guard and tactile cues for upright posture, verbal cues to relax L UE to allow for reciprocal arm swing. Alternating LE taps on 4 inch step, 2x20 with min guard. Sit<>stands without UE support x10 with supervision, cues for slow descent from stand>sit to improve eccentric quad control. Backwards walking x30', initially without AD, then with use of L handrail to facilitate increased step/stride length and increased pace with min guard. Side stepping 20' x2 in each direction with therapist's hands as B UE support. Standing heel raises x20 with patient's UEs on therapist's shoulders. Patient left sitting in wheelchair with all needs within reach.  Therapy Documentation Precautions:  Precautions Precautions: Fall Precaution Comments: LUE and LLE mild hemiparesis Restrictions Weight Bearing Restrictions: No Pain: Pain Assessment Pain Assessment: No/denies pain Pain Score: 0-No pain Locomotion : Ambulation Ambulation/Gait Assistance: 5: Supervision   See FIM for current functional status  Therapy/Group: Individual Therapy  Lillia Abed. Tonatiuh Mallon, PT, DPT 12/07/2013, 1:40 PM

## 2013-12-08 ENCOUNTER — Inpatient Hospital Stay (HOSPITAL_COMMUNITY): Payer: Medicare Other | Admitting: Occupational Therapy

## 2013-12-08 ENCOUNTER — Inpatient Hospital Stay (HOSPITAL_COMMUNITY): Payer: Medicare Other | Admitting: Rehabilitation

## 2013-12-08 ENCOUNTER — Encounter (HOSPITAL_COMMUNITY): Payer: Medicare Other | Admitting: Occupational Therapy

## 2013-12-08 LAB — GLUCOSE, CAPILLARY
GLUCOSE-CAPILLARY: 108 mg/dL — AB (ref 70–99)
GLUCOSE-CAPILLARY: 114 mg/dL — AB (ref 70–99)
GLUCOSE-CAPILLARY: 129 mg/dL — AB (ref 70–99)
GLUCOSE-CAPILLARY: 120 mg/dL — AB (ref 70–99)

## 2013-12-08 MED ORDER — SENNOSIDES-DOCUSATE SODIUM 8.6-50 MG PO TABS
2.0000 | ORAL_TABLET | Freq: Every day | ORAL | Status: DC
Start: 2013-12-08 — End: 2013-12-09
  Administered 2013-12-08: 2 via ORAL
  Filled 2013-12-08: qty 2

## 2013-12-08 NOTE — Progress Notes (Signed)
Physical Therapy Session Note  Patient Details  Name: Scott Conley MRN: [id number] Date of Birth: 10-29-1940  Today's Date: 12/08/2013 Time: 5035-4656 Time Calculation (min): 60 min  Short Term Goals: Week 1:  PT Short Term Goal 1 (Week 1): =LTG's due to ELOS  Skilled Therapeutic Interventions/Progress Updates:   Pt received in therapy gym, ambulating with OT to therapy mat, agreeable to next PT session.  Focus of session was high level balance with gait while tossing ball up/down initially then progressing to side stepping while tossing ball back and fourth to therapist.  Performed all at supervision level, however noted that when side stepping, would actually end up braiding LEs with cues for maintaining side stepping for safety.  Single instance of LOB, however pt able to self correct.  Then worked on standing on balance beam while performing squats to reach for clothes pins with LUE and placing them over to the L on pole to work on quad/glute strength and increased weight shifting and WB through LLE.   Performed floor transfer today with min/guard assist for safety and total assist cues for naming situations when to call 911 vs when to attempt floor transfer.  Also provided min cues for hand placement and technique once in tall kneeling.  Ambulated to/from ADL apt in order to continue to work on high level balance, standing tolerance and increase use of LUE with task of making bed and donning pillow cases to pillows.  Able to perform all at supervision level, however requires seated rest breaks when doing pillows due to back pain and increased fatigue.  Performed seated nustep x 8 mins with BLEs only at level 4 resistance to increase overall activity tolerance and NMR to LLE.  Tolerated well with only single rest break.  Ended session with ambulation back towards room then performing tandem stepping and backwards walking on carpet for high level balance.  Pt left in w/c in room with all needs in  reach.  Adjusted RW in room to correct height.    Therapy Documentation Precautions:  Precautions Precautions: Fall Precaution Comments: LUE and LLE mild hemiparesis Restrictions Weight Bearing Restrictions: No   Pain: 5/10 back pain with floor transfer and squats, better when upright.  States he does not need Tylenol at this time.        See FIM for current functional status  Therapy/Group: Individual Therapy  Raquel Sarna A Maxene Byington 12/08/2013, 3:53 PM

## 2013-12-08 NOTE — Progress Notes (Addendum)
Subjective/Complaints: 73 y.o. right-handed male on no scheduled medications. Independent prior to admission living alone. Presented 11/26/2013 with left-sided weakness and slurred speech. Noted elevated systolic blood pressure in the 200s as well as findings of potassium 6.6. MRI of the brain shows right hemispheric acute infarct. MRA of the head with atherosclerotic type changes. Echocardiogram with ejection fraction of 60% no wall motion abnormalities. Carotid Dopplers with no ICA stenosis. Patient did not receive TPA. Coreg was added for hypertension control. TEE 11/29/2013 had to be aborted before bubble study due to increased respiratory secretions with severe cough. Bedside saline contrast study performed which showed negative for interatrial shunt or embolism. Loop recorder placed 11/29/2013 per Dr. Caryl Comes. Neurology followup maintain on aspirin for CVA prophylaxis   Pt c/o dosing off during the day Review of Systems - Negative except feels week Objective: Vital Signs: Blood pressure 170/82, pulse 65, temperature 97.8 F (36.6 C), temperature source Oral, resp. rate 18, height '6\' 4"'  (1.93 m), weight 106.414 kg (234 lb 9.6 oz), SpO2 99.00%. No results found. Results for orders placed during the hospital encounter of 11/30/13 (from the past 72 hour(s))  GLUCOSE, CAPILLARY     Status: Abnormal   Collection Time    12/05/13 11:30 AM      Result Value Ref Range   Glucose-Capillary 116 (*) 70 - 99 mg/dL  GLUCOSE, CAPILLARY     Status: Abnormal   Collection Time    12/05/13  4:28 PM      Result Value Ref Range   Glucose-Capillary 155 (*) 70 - 99 mg/dL   Comment 1 Notify RN    GLUCOSE, CAPILLARY     Status: Abnormal   Collection Time    12/05/13  9:24 PM      Result Value Ref Range   Glucose-Capillary 106 (*) 70 - 99 mg/dL  GLUCOSE, CAPILLARY     Status: Abnormal   Collection Time    12/06/13  7:23 AM      Result Value Ref Range   Glucose-Capillary 118 (*) 70 - 99 mg/dL  GLUCOSE,  CAPILLARY     Status: None   Collection Time    12/06/13 12:06 PM      Result Value Ref Range   Glucose-Capillary 98  70 - 99 mg/dL  GLUCOSE, CAPILLARY     Status: Abnormal   Collection Time    12/06/13  4:26 PM      Result Value Ref Range   Glucose-Capillary 106 (*) 70 - 99 mg/dL  GLUCOSE, CAPILLARY     Status: Abnormal   Collection Time    12/06/13  8:42 PM      Result Value Ref Range   Glucose-Capillary 105 (*) 70 - 99 mg/dL  CREATININE, SERUM     Status: Abnormal   Collection Time    12/07/13  1:34 AM      Result Value Ref Range   Creatinine, Ser 0.90  0.50 - 1.35 mg/dL   GFR calc non Af Amer 82 (*) >90 mL/min   GFR calc Af Amer >90  >90 mL/min   Comment: (NOTE)     The eGFR has been calculated using the CKD EPI equation.     This calculation has not been validated in all clinical situations.     eGFR's persistently <90 mL/min signify possible Chronic Kidney     Disease.  CREATININE, SERUM     Status: Abnormal   Collection Time    12/07/13  5:35 AM  Result Value Ref Range   Creatinine, Ser 0.92  0.50 - 1.35 mg/dL   GFR calc non Af Amer 81 (*) >90 mL/min   GFR calc Af Amer >90  >90 mL/min   Comment: (NOTE)     The eGFR has been calculated using the CKD EPI equation.     This calculation has not been validated in all clinical situations.     eGFR's persistently <90 mL/min signify possible Chronic Kidney     Disease.  GLUCOSE, CAPILLARY     Status: Abnormal   Collection Time    12/07/13  7:08 AM      Result Value Ref Range   Glucose-Capillary 104 (*) 70 - 99 mg/dL  GLUCOSE, CAPILLARY     Status: Abnormal   Collection Time    12/07/13 12:12 PM      Result Value Ref Range   Glucose-Capillary 115 (*) 70 - 99 mg/dL  GLUCOSE, CAPILLARY     Status: Abnormal   Collection Time    12/07/13  4:28 PM      Result Value Ref Range   Glucose-Capillary 115 (*) 70 - 99 mg/dL  GLUCOSE, CAPILLARY     Status: Abnormal   Collection Time    12/07/13  9:20 PM      Result Value  Ref Range   Glucose-Capillary 108 (*) 70 - 99 mg/dL     HEENT: normal Cardio: RRR and no murmurs Resp: CTA B/L and unlabored GI: BS positive and NT,ND Extremity:  No Edema Skin:   Intact Neuro: Alert/Oriented, Cranial Nerve II-XII normal, Normal Sensory, Abnormal Motor 4- Left denltoid , bi, tri,grip, 4/5 B HF, KE, ADF, 5/5  RUE and Abnormal FMC Ataxic/ dec FMC Musc/Skel:  Normal Gen NAD   Assessment/Plan: 1. Functional deficits secondary to Right embolic MCA infarct which require 3+ hours per day of interdisciplinary therapy in a comprehensive inpatient rehab setting. Physiatrist is providing close team supervision and 24 hour management of active medical problems listed below. Physiatrist and rehab team continue to assess barriers to discharge/monitor patient progress toward functional and medical goals. Team conference today please see physician documentation under team conference tab, met with team face-to-face to discuss problems,progress, and goals. Formulized individual treatment plan based on medical history, underlying problem and comorbidities. FIM: FIM - Bathing Bathing Steps Patient Completed: Chest;Right Arm;Left Arm;Abdomen;Front perineal area;Buttocks;Right upper leg;Left upper leg;Right lower leg (including foot);Left lower leg (including foot) Bathing: 5: Supervision: Safety issues/verbal cues  FIM - Upper Body Dressing/Undressing Upper body dressing/undressing steps patient completed: Thread/unthread right sleeve of pullover shirt/dresss;Thread/unthread left sleeve of pullover shirt/dress;Put head through opening of pull over shirt/dress;Pull shirt over trunk Upper body dressing/undressing: 5: Supervision: Safety issues/verbal cues FIM - Lower Body Dressing/Undressing Lower body dressing/undressing steps patient completed: Thread/unthread right underwear leg;Thread/unthread left underwear leg;Pull underwear up/down;Thread/unthread right pants leg;Thread/unthread left  pants leg;Pull pants up/down;Don/Doff right sock;Don/Doff left sock;Fasten/unfasten left shoe;Fasten/unfasten pants;Don/Doff right shoe;Don/Doff left shoe;Fasten/unfasten right shoe Lower body dressing/undressing: 5: Supervision: Safety issues/verbal cues  FIM - Toileting Toileting steps completed by patient: Adjust clothing prior to toileting;Performs perineal hygiene;Adjust clothing after toileting Toileting Assistive Devices: Grab bar or rail for support Toileting: 6: Assistive device: No helper  FIM - Radio producer Devices: Elevated toilet seat;Grab bars Toilet Transfers: 0-Activity did not occur  FIM - Control and instrumentation engineer Devices: Arm rests;Walker Bed/Chair Transfer: 5: Bed > Chair or W/C: Supervision (verbal cues/safety issues);5: Chair or W/C > Bed: Supervision (verbal cues/safety issues)  FIM - Locomotion: Wheelchair Distance: 120 Locomotion: Wheelchair: 0: Activity did not occur FIM - Locomotion: Ambulation Locomotion: Ambulation Assistive Devices: Administrator Ambulation/Gait Assistance: 5: Supervision Locomotion: Ambulation: 5: Travels 150 ft or more with supervision/safety issues  Comprehension Comprehension Mode: Auditory Comprehension: 6-Follows complex conversation/direction: With extra time/assistive device  Expression Expression Mode: Verbal Expression: 6-Expresses complex ideas: With extra time/assistive device  Social Interaction Social Interaction: 7-Interacts appropriately with others - No medications needed.  Problem Solving Problem Solving: 5-Solves complex 90% of the time/cues < 10% of the time  Memory Memory: 6-More than reasonable amt of time  Medical Problem List and Plan:  1. Functional deficits secondary to right MCA infarct felt to be embolic, has loop recorder , may shower with this.  2. DVT Prophylaxis/Anticoagulation: Subcutaneous Lovenox. Monitor platelet counts and any signs of  bleeding  3. Pain Management: Tylenol as needed  5. Neuropsych: This patient is capable of making decisions on his own behalf.  6. Hypertension. Coreg 6.25 mg twice a day. Monitor with increased mobility and adjust appropriately. Add ACE I          7. Newly diagnosed diabetes mellitus. Hemoglobin A1c 7.4. Off glucophage due to diarrhea Provide diabetic teaching. Check blood sugars a.c. and at bedtime. CBGs look ok on diet  8. Hyperlipidemia. Lipitor 9.  Constipation- no problems at home, may need stool softener   LOS (Days) 8 A FACE TO FACE EVALUATION WAS PERFORMED  Charlett Blake 12/08/2013, 7:26 AM

## 2013-12-08 NOTE — Progress Notes (Signed)
Physical Therapy Session Note  Patient Details  Name: Scott Conley MRN: [id number] Date of Birth: [birth year]  Today's Date: 12/08/2013 Time: 1030-1116 Time Calculation (min): 46 min  Short Term Goals: Week 1:  PT Short Term Goal 1 (Week 1): =LTG's due to ELOS  Skilled Therapeutic Interventions/Progress Updates:   Pt received in restroom, without supervision, therefore provided max education on having someone at least with him when ambulating in room and to restroom.  Pt verbalized understanding and states that he will call for assist.  Focus of session was gait training in community setting (>500') outside up/down inclines, over uneven surfaces, up/down stairs, and over grass all with RW (except stairs).  Pt able to perform all at supervision with min cues for use of RW over grass, maintaining closeness to RW, and maintaining upright posture.  Pt requires intermittent rest breaks due to fatigue and also cues for safety and sequencing on stairs with use of B handrails then single handrail.  Also worked on negotiating around small columns outside to simulate tight spaces and also in crowded areas.  Note that when higher level of distraction, pt tends to demonstrate increased forward flexion and increased distance from RW.  Ambulated back to room and left in w/c with all needs in reach.    Therapy Documentation Precautions:  Precautions Precautions: Fall Precaution Comments: LUE and LLE mild hemiparesis Restrictions Weight Bearing Restrictions: No     Pain: Pt with no c/o pain during session.  Ambulation Ambulation/Gait Assistance: 5: Supervision   See FIM for current functional status  Therapy/Group: Individual Therapy  Scott Conley 12/08/2013, 11:24 AM

## 2013-12-08 NOTE — Progress Notes (Signed)
Occupational Therapy Session Note  Patient Details  Name: Scott Conley MRN: [id number] Date of Birth: May 04, 1941  Today's Date: 12/08/2013 Time: 0900-1000 Time Calculation (min): 60 min  Short Term Goals: Week 1:  OT Short Term Goal 1 (Week 1): Pt will perform all bathing with modified independence sit to stand. OT Short Term Goal 2 (Week 1): Pt will perform all dressing sit to stand with min assist.  OT Short Term Goal 3 (Week 1): Pt will perform toilet transfers with modified independence using 3:1. OT Short Term Goal 4 (Week 1): Pt will perform tub/shower transfer with modified independence. OT Short Term Goal 5 (Week 1): Pt will perform LUE FM and gross motor exercises with independence in order to increase functional use.   Skilled Therapeutic Interventions/Progress Updates:    Pt performed shower and dressing this am.  He was able to ambulate around the room and to the shower with supervision and no assistive device.  He does exhibit increased trunk flexion with prolonged standing and functional transfers.  He still exhibits decreased LUE functional coordination as well but only impacts his proficiency with fastening buttons and pulling up his pants.  Pt stood to brush his teeth for approximately 5 mins but then reported back pain and needed to sit back down to rest and finish his shaving.  Concluded session with pt working on LUE functional coordination by having the pt work on Emergency planning/management officer and towel with just use of the LUE.     Therapy Documentation Precautions:  Precautions Precautions: Fall Precaution Comments: LUE and LLE mild hemiparesis Restrictions Weight Bearing Restrictions: No  Pain: Pain Assessment Pain Assessment: No/denies painNo report of pain when asked ADL: See FIM for current functional status  Therapy/Group: Individual Therapy  Cindra Presume OTR/L 12/08/2013, 11:03 AM

## 2013-12-08 NOTE — Progress Notes (Signed)
Occupational Therapy Session Note  Patient Details  Name: Scott Conley MRN: [id number] Date of Birth: Nov 07, 1940  Today's Date: 12/08/2013 Time: 1350-1416 Time Calculation (min): 26 min  Skilled Therapeutic Interventions/Progress Updates:    Pt ambulated down to the ADL kitchen using the RW and distant supervision.  Once in the kitchen had pt work on making a scrambled egg as he states that occasionally he does this at home.  Provided instruction on correct use of the RW for transporting items around the kitchen safely.  However, I do feel that his balance is well enough to maneuver around the kitchen without need for the RW.  He wanted to use it based on having to walk from his room stating his back begins to get weak if he walks long distances.  He was able to fry an egg with min instructional cueing for location of the items he needed and occasional cueing to stay inside of the walker and not attempt to walk while holding onto only one side of the walker.  Pt reports that his daughter will do most of the cooking when she comes to check on him.  Therapy Documentation Precautions:  Precautions Precautions: Fall Precaution Comments: LUE and LLE mild hemiparesis Restrictions Weight Bearing Restrictions: No  Pain: Pain Assessment Pain Assessment: No/denies pain ADL: See FIM for current functional status  Therapy/Group: Individual Therapy  Cindra Presume OTR/L 12/08/2013, 2:47 PM

## 2013-12-09 ENCOUNTER — Inpatient Hospital Stay (HOSPITAL_COMMUNITY): Payer: Medicare Other | Admitting: Occupational Therapy

## 2013-12-09 ENCOUNTER — Inpatient Hospital Stay (HOSPITAL_COMMUNITY): Payer: Medicare Other | Admitting: Physical Therapy

## 2013-12-09 ENCOUNTER — Inpatient Hospital Stay (HOSPITAL_COMMUNITY): Payer: Medicare Other | Admitting: Rehabilitation

## 2013-12-09 ENCOUNTER — Encounter (HOSPITAL_COMMUNITY): Payer: Medicare Other | Admitting: Occupational Therapy

## 2013-12-09 DIAGNOSIS — I6330 Cerebral infarction due to thrombosis of unspecified cerebral artery: Secondary | ICD-10-CM

## 2013-12-09 LAB — GLUCOSE, CAPILLARY
GLUCOSE-CAPILLARY: 117 mg/dL — AB (ref 70–99)
Glucose-Capillary: 115 mg/dL — ABNORMAL HIGH (ref 70–99)
Glucose-Capillary: 97 mg/dL (ref 70–99)
Glucose-Capillary: 109 mg/dL — ABNORMAL HIGH (ref 70–99)

## 2013-12-09 MED ORDER — SENNOSIDES-DOCUSATE SODIUM 8.6-50 MG PO TABS
1.0000 | ORAL_TABLET | Freq: Every day | ORAL | Status: DC
Start: 2013-12-09 — End: 2013-12-10
  Administered 2013-12-09: 1 via ORAL
  Filled 2013-12-09: qty 1

## 2013-12-09 NOTE — Discharge Summary (Signed)
NAMEBLAIR, Scott Conley NO.:  [id number]  MEDICAL RECORD NO.:  [id number]  LOCATION:  [id number]                        FACILITY:  [facility]  PHYSICIAN:  Charlett Blake, M.D.DATE OF BIRTH:  April 24, 1941  DATE OF ADMISSION:  11/30/2013 DATE OF DISCHARGE:  12/10/2013                              DISCHARGE SUMMARY   DISCHARGE DIAGNOSES: 1. Functional deficits secondary to right middle cerebral artery     infarct felt to be embolic. 2. Subcutaneous Lovenox for deep vein thrombosis prophylaxis. 3. Hypertension. 4. Hyperlipidemia. 5. Constipation.  HISTORY OF PRESENT ILLNESS:  This is a 73 year old right-handed male on no scheduled medications, independent prior to admission, living alone. Presented Nov 26, 2013, with left-sided weakness and slurred speech. Noted elevated blood pressure in the 200s as well as findings of potassium 6.6.  MRI of the brain showed right hemispheric acute infarct. MRA of the head with atherosclerotic type changes.  Echocardiogram with ejection fraction of 60%, no wall motion abnormalities.  Carotid Dopplers with no ICA stenosis.  The patient did not receive tPA.  Coreg was added for blood pressure control.  TEE Nov 29, 2013, had to be aborted before bubble study due to increased respiratory secretions with severe cough.  Bedside saline contrast study performed showed negative for inter-atrial shunt or embolism.  A loop recorder was placed per Dr. Caryl Comes.  Neurology Service was consulted, maintained on aspirin for CVA prophylaxis as well as subcutaneous Lovenox for DVT prophylaxis. Elevated hemoglobin 7.4.  Glucophage initiated.  Physical and occupational therapy ongoing. The patient was admitted for a comprehensive rehab program.  PAST MEDICAL HISTORY:  See discharge diagnoses.  SOCIAL HISTORY:  Lives alone.  FUNCTIONAL HISTORY PRIOR TO ADMISSION:  Independent.  FUNCTIONAL STATUS UPON ADMISSION TO REHAB SERVICES:  Min assist to ambulate  with a rolling walker, minimal assist for gait details, min to mod assist for activities of daily living.  PHYSICAL EXAMINATION:  VITAL SIGNS:  Blood pressure 140/73, pulse 83, temperature 97.4, respirations 18. GENERAL:  This was an alert male, followed 3-step commands, oriented to person, place, and time. LUNGS:  Clear to auscultation. CARDIAC:  Regular rate and rhythm. ABDOMEN:  Soft, nontender.  Good bowel sounds. PSYCH:  He had good insight and awareness of his deficits. NEURO:  Speech was fluent.  REHABILITATION HOSPITAL COURSE:  The patient was admitted to Inpatient Rehab Services with therapies initiated on a 3-hour daily basis consisting of physical therapy, occupational therapy, and rehabilitation nursing.  The following issues were addressed during the patient's rehabilitation stay.  Pertaining to Mr. Hanken's right MCA infarct, he remained on aspirin therapy.  He would follow up with Neurology Services.  Subcutaneous Lovenox for DVT prophylaxis with no bleeding episodes.  Blood pressures controlled on Coreg 6.25 mg b.i.d. as well as the addition of Altace 2.5 mg daily.  He would follow up with the PCP which was to be provided by Case Management.  Newly diagnosed diabetes mellitus. Hemoglobin A1c of 7.4.  Glucophage would be initiated.  The patient received weekly collaborative interdisciplinary team conferences to discuss estimated length of stay, family teaching, and any barriers to discharge.  Session has focused on high-level balancing gait while tossing  a ball progressing to side stepping.  Perform floor transfers with minimal guard assist for safety.  Ambulated to and from activities of daily living apartment in order to continue to work on high-level balance.  Occupational therapy, the patient was able to gather his belongings, shower, bathe, ambulate to sink side to perform daily activities of daily living.  Full teaching was completed.  Plan was to be discharged  to home with ongoing therapies dictated per [facility].  DISCHARGE MEDICATIONS: 1. Aspirin 325 mg p.o. daily. 2. Lipitor 80 mg p.o. daily. 3. Coreg 6.25 mg b.i.d. 4. Altace 2.5 mg p.o. daily.  DIET:  Diabetic diet.  The patient's Glucophage had been held due to some diarrhea.  He would follow up Dr. Alysia Penna at the outpatient rehab center January 18, 2014, Dr. Antony Contras 1 month call for appointment, Dr. Virl Axe, Cardiology Services.  Case Management to arrange for PCP.     Lauraine Rinne, P.A.   ______________________________ Charlett Blake, M.D.    DA/MEDQ  D:  12/09/2013  T:  12/09/2013  Job:  [id number]  cc:   Deboraha Sprang, MD, [facility] Pramod P. Leonie Man, MD

## 2013-12-09 NOTE — Discharge Summary (Signed)
Discharge summary job [id number]

## 2013-12-09 NOTE — Progress Notes (Signed)
Social Work Patient ID: Scott Conley, male   DOB: 12/27/1940, 73 y.o.   MRN: [id number]  CSW met with pt to update him on team conference discussion and called pt's dtr in [place] to confirm that pt remains ready for d/c tomorrow. They feel comfortable with this date and are making arrangements for having him at home.  Pt will need to do [facility] therapies and he still needs a primary care physician.  Dtr asked that CSW arrange PCP.  CSW is currently working on this.  CSW has already ordered pt's DME and arranged HH.  Will continue to follow and assist as needed.

## 2013-12-09 NOTE — Progress Notes (Signed)
Physical Therapy Session Note  Patient Details  Name: Scott Conley MRN: [id number] Date of Birth: 11-Aug-1940  Today's Date: 12/09/2013 Time: 9528-4132 Time Calculation (min): 45 min  Short Term Goals: Week 1:  PT Short Term Goal 1 (Week 1): =LTG's due to ELOS  Skilled Therapeutic Interventions/Progress Updates:   Pt received sitting in w/c. Gait training using RW x > 150 ft x 2 throughout rehab unit with mod I in controlled environment. Performed Berg Balance Scale with score of 46/56, improved from 33/56 on evaluation, indicating decreased risk of falls. Patient noted to have increased difficulty with SLS, for NMR patient performed alt LE step taps to 6" step to fatigue with no AD and supervision.  Patient negotiated up/down curb step using RW with supervision, vc's for safety. Gait training without AD x 50 ft on carpet with mod I. Pt required intermittent rest breaks due to fatigue throughout session. Pt returned to room and left sitting in w/c, mod I in room.    Therapy Documentation Precautions:  Precautions Precautions: Fall Precaution Comments: LUE and LLE mild hemiparesis Restrictions Weight Bearing Restrictions: No Pain: Pain Assessment Pain Assessment: No/denies pain Pain Score: 0-No pain  See FIM for current functional status  Therapy/Group: Individual Therapy  Laretta Alstrom 12/09/2013, 4:50 PM

## 2013-12-09 NOTE — Progress Notes (Signed)
Occupational Therapy Discharge Summary  Patient Details  Name: Scott Conley MRN: [id number] Date of Birth: 04-26-1941  Today's Date: 12/09/2013 Time: 3382-5053 Time Calculation (min): 57 min  Patient has met 12 of 12 long term goals due to improved activity tolerance, improved balance, postural control, ability to compensate for deficits, functional use of  LEFT upper extremity, improved awareness and improved coordination.  Patient to discharge at overall Modified Independent level.  Patient will be living alone but his daughters are planning to come and stay with him initially at discharge.    Reasons goals not met: NA  Recommendation:  Patient will benefit from ongoing skilled OT services in outpatient setting to continue to advance functional skills in the area of BADL.  Pt continues to demonstrate slight balance and endurance issues as well as decreased LUE functional use and coordination.  Feel he will benefit from continued OT to help further progress LUE functional use and overall independence with selfcare tasks.   Equipment: RW and tub seat  Reasons for discharge: treatment goals met and discharge from hospital  Patient/family agrees with progress made and goals achieved: Yes  OT Discharge Precautions/Restrictions  Precautions Precautions: Fall Precaution Comments: LUE and LLE mild hemiparesis Restrictions Weight Bearing Restrictions: No  Vital Signs Therapy Vitals Temp: 97.8 F (36.6 C) Temp src: Oral Pulse Rate: 59 Resp: 17 BP: 125/74 mmHg Patient Position (if appropriate): Sitting Oxygen Therapy SpO2: 97 % O2 Device: None (Room air) Pain Pain Assessment Pain Assessment: No/denies pain Pain Score: 0-No pain ADL  See FIM scale for details  Vision/Perception  Vision- History Baseline Vision/History: Wears glasses Patient Visual Report: No change from baseline Vision- Assessment Vision Assessment?: No apparent visual deficits;Yes Eye Alignment:  Within Functional Limits Alignment/Gaze Preference: Within Defined Limits Tracking/Visual Pursuits: Able to track stimulus in all quads without difficulty Saccades: Within functional limits Convergence: Within functional limits Visual Fields: No apparent deficits  Cognition Overall Cognitive Status: Within Functional Limits for tasks assessed Arousal/Alertness: Awake/alert Orientation Level: Oriented X4 Attention: Focused;Sustained;Selective;Alternating Focused Attention: Appears intact Sustained Attention: Appears intact Selective Attention: Appears intact Alternating Attention: Appears intact Alternating Attention Impairment: Functional basic Memory: Impaired Memory Impairment: Decreased recall of new information Awareness: Appears intact Awareness Impairment: Anticipatory impairment Problem Solving: Appears intact Problem Solving Impairment: Functional complex Safety/Judgment: Appears intact Comments: Pt still occasionally requres cueing to use the walker correctly. Sensation Sensation Light Touch: Appears Intact Stereognosis: Impaired Detail Stereognosis Impaired Details: Impaired LUE Hot/Cold: Not tested Proprioception: Appears Intact Proprioception Impaired Details: Impaired LUE Additional Comments: Pt demonstrated decreased proprioception and stereognosis in the left hand.   Coordination Gross Motor Movements are Fluid and Coordinated: No Fine Motor Movements are Fluid and Coordinated: No Coordination and Movement Description: Pt with decreased ability to motor plan functional use of the LUE with FM tasks.   Finger Nose Finger Test: Slight ataxia noted and decreased speed in the LUE compared to the right. Heel Shin Test: Grossly WFL, however not able to perform full ROM Motor  Motor Motor: Hemiplegia;Abnormal postural alignment and control Motor - Discharge Observations: Pt continues to demonstrate decreased coordination in LUE>LE, however has shown marked improvement  in functional tasks.  Also continue to note forward flexed posture with gait, esp when fatigued or without AD Mobility  Bed Mobility Bed Mobility: Supine to Sit;Sit to Supine Supine to Sit: 6: Modified independent (Device/Increase time) Sit to Supine: 6: Modified independent (Device/Increase time) Transfers Transfers: Sit to Stand Sit to Stand: 6: Modified independent (Device/Increase time);From toilet;With  upper extremity assist Stand to Sit: 6: Modified independent (Device/Increase time);To toilet  Trunk/Postural Assessment  Cervical Assessment Cervical Assessment: Within Functional Limits Thoracic Assessment Thoracic Assessment: Exceptions to [facility] Thoracic Strength Overall Thoracic Strength Comments: Pt with slight thoracic kyphosis in standing. Lumbar Assessment Lumbar Assessment: Exceptions to [facility] Lumbar Strength Overall Lumbar Strength Comments: Pt demonstrates posterior pelvic tilt during sitting.  Postural Control Postural Limitations: Pt demonstrates forward flexed trunk, protracted shoulders and forward head during gait training.    Balance Balance Balance Assessed: Yes Standardized Balance Assessment Standardized Balance Assessment: Berg Balance Test Berg Balance Test Sit to Stand: Able to stand without using hands and stabilize independently Standing Unsupported: Able to stand safely 2 minutes Sitting with Back Unsupported but Feet Supported on Floor or Stool: Able to sit safely and securely 2 minutes Stand to Sit: Sits safely with minimal use of hands Transfers: Able to transfer safely, minor use of hands Standing Unsupported with Eyes Closed: Able to stand 10 seconds safely Standing Ubsupported with Feet Together: Able to place feet together independently and stand 1 minute safely From Standing, Reach Forward with Outstretched Arm: Can reach confidently >25 cm (10") From Standing Position, Pick up Object from Floor: Able to pick up shoe safely and easily From  Standing Position, Turn to Look Behind Over each Shoulder: Looks behind one side only/other side shows less weight shift Turn 360 Degrees: Able to turn 360 degrees safely but slowly Standing Unsupported, Alternately Place Feet on Step/Stool: Able to complete 4 steps without aid or supervision Standing Unsupported, One Foot in Front: Able to take small step independently and hold 30 seconds Standing on One Leg: Tries to lift leg/unable to hold 3 seconds but remains standing independently Total Score: 46 Static Sitting Balance Static Sitting - Balance Support: Feet supported;Bilateral upper extremity supported Static Sitting - Level of Assistance: 6: Modified independent (Device/Increase time) Dynamic Sitting Balance Dynamic Sitting - Balance Support: Feet supported;During functional activity Dynamic Sitting - Level of Assistance: 6: Modified independent (Device/Increase time) Static Standing Balance Static Standing - Balance Support: During functional activity Static Standing - Level of Assistance: 6: Modified independent (Device/Increase time) Dynamic Standing Balance Dynamic Standing - Balance Support: During functional activity Dynamic Standing - Level of Assistance: 6: Modified independent (Device/Increase time) Dynamic Standing - Comments: During selfcare tasks Extremity/Trunk Assessment RUE Assessment RUE Assessment: Within Functional Limits LUE Assessment LUE Assessment: Exceptions to [facility] LUE Strength LUE Overall Strength Comments: Pt is currently Brunnstrum  stage V level in the left arm and hand.  He demonstrates increased strength and AROM overall in all joints compared to initial evaluation. He still demonstrates decreased ability to oppose the thumb to the 4th and 5th digits.  Strength overall 4/5 throughout including shoulder flexion.  Decreased ability with FM in-hand manipulation tasks secondary to decreased motor planning.       See FIM for current functional status  Cindra Presume OTR/L 12/09/2013, 4:46 PM

## 2013-12-09 NOTE — Progress Notes (Signed)
Occupational Therapy Session Note  Patient Details  Name: Scott Conley MRN: [id number] Date of Birth: [birth year]  Today's Date: 12/09/2013 Time: 1103-1200 Time Calculation (min): 57 min  Short Term Goals: Week 1:  OT Short Term Goal 1 (Week 1): Pt will perform all bathing with modified independence sit to stand. OT Short Term Goal 2 (Week 1): Pt will perform all dressing sit to stand with min assist.  OT Short Term Goal 3 (Week 1): Pt will perform toilet transfers with modified independence using 3:1. OT Short Term Goal 4 (Week 1): Pt will perform tub/shower transfer with modified independence. OT Short Term Goal 5 (Week 1): Pt will perform LUE FM and gross motor exercises with independence in order to increase functional use.   Skilled Therapeutic Interventions/Progress Updates:    Pt performed bathing and dressing at a modified independent level during session.  Therapist instructed him to use the RW for safety as pt has been noted to furniture walk at time and feel this is a much safer option.  Pt was able to transfer to the shower and it on the seat to bathe.  He stood to dry off without difficulty and performed all dressing and grooming tasks sit to stand at the sink.  He continues to demonstrate increased forward trunk flexion and lean in standing, especially when standing for extended period of times.  Feel the support of the walker can assist with this aspect as well.  Issued pt foam pieces to work on in Ecologist and pinching skills as well for the LUE.  Pt still with decreased FM control and dexterity.   Therapy Documentation Precautions:  Precautions Precautions: Fall Precaution Comments: LUE and LLE mild hemiparesis Restrictions Weight Bearing Restrictions: No  Pain: Pain Assessment Pain Assessment: No/denies pain ADL: See FIM for current functional status  Therapy/Group: Individual Therapy  Cindra Presume OTR/L 12/09/2013, 1:54 PM

## 2013-12-09 NOTE — Progress Notes (Signed)
Social Work Discharge Note  The overall goal for the admission was met for:   Discharge location: Yes - home  Length of Stay: Yes - 10 days  Discharge activity level: Yes - Modified Independent  Home/community participation: Yes  Services provided included: MD, RD, PT, OT, SLP, RN, TR, Pharmacy and [place]: Medicare and Other: [employer] for prescription coverage  Follow-up services arranged: Home Health: PT, OT, DME: wide rolling walker and shower seat and Patient/Family has no preference for HH/DME agencies  Comments (or additional information):  Patient/Family verbalized understanding of follow-up arrangements: Yes  Individual responsible for coordination of the follow-up plan: pt with support from his two daughters  Confirmed correct DME delivered: Silvestre Mesi Kortne All 12/09/2013    [place]

## 2013-12-09 NOTE — Progress Notes (Addendum)
Subjective/Complaints: 73 y.o. right-handed male on no scheduled medications. Independent prior to admission living alone. Presented 11/26/2013 with left-sided weakness and slurred speech. Noted elevated systolic blood pressure in the 200s as well as findings of potassium 6.6. MRI of the brain shows right hemispheric acute infarct. MRA of the head with atherosclerotic type changes. Echocardiogram with ejection fraction of 60% no wall motion abnormalities. Carotid Dopplers with no ICA stenosis. Patient did not receive TPA. Coreg was added for hypertension control. TEE 11/29/2013 had to be aborted before bubble study due to increased respiratory secretions with severe cough. Bedside saline contrast study performed which showed negative for interatrial shunt or embolism. Loop recorder placed 11/29/2013 per Dr. Caryl Comes. Neurology followup maintain on aspirin for CVA prophylaxis  Bowels too loose, increased senna to 2 tabs Review of Systems - Negative except feels week Objective: Vital Signs: Blood pressure 122/74, pulse 67, temperature 97.8 F (36.6 C), temperature source Oral, resp. rate 17, height '6\' 4"'  (1.93 m), weight 111.6 kg (246 lb 0.5 oz), SpO2 97.00%. No results found. Results for orders placed during the hospital encounter of 11/30/13 (from the past 72 hour(s))  GLUCOSE, CAPILLARY     Status: None   Collection Time    12/06/13 12:06 PM      Result Value Ref Range   Glucose-Capillary 98  70 - 99 mg/dL  GLUCOSE, CAPILLARY     Status: Abnormal   Collection Time    12/06/13  4:26 PM      Result Value Ref Range   Glucose-Capillary 106 (*) 70 - 99 mg/dL  GLUCOSE, CAPILLARY     Status: Abnormal   Collection Time    12/06/13  8:42 PM      Result Value Ref Range   Glucose-Capillary 105 (*) 70 - 99 mg/dL  CREATININE, SERUM     Status: Abnormal   Collection Time    12/07/13  1:34 AM      Result Value Ref Range   Creatinine, Ser 0.90  0.50 - 1.35 mg/dL   GFR calc non Af Amer 82 (*) >90 mL/min    GFR calc Af Amer >90  >90 mL/min   Comment: (NOTE)     The eGFR has been calculated using the CKD EPI equation.     This calculation has not been validated in all clinical situations.     eGFR's persistently <90 mL/min signify possible Chronic Kidney     Disease.  CREATININE, SERUM     Status: Abnormal   Collection Time    12/07/13  5:35 AM      Result Value Ref Range   Creatinine, Ser 0.92  0.50 - 1.35 mg/dL   GFR calc non Af Amer 81 (*) >90 mL/min   GFR calc Af Amer >90  >90 mL/min   Comment: (NOTE)     The eGFR has been calculated using the CKD EPI equation.     This calculation has not been validated in all clinical situations.     eGFR's persistently <90 mL/min signify possible Chronic Kidney     Disease.  GLUCOSE, CAPILLARY     Status: Abnormal   Collection Time    12/07/13  7:08 AM      Result Value Ref Range   Glucose-Capillary 104 (*) 70 - 99 mg/dL  GLUCOSE, CAPILLARY     Status: Abnormal   Collection Time    12/07/13 12:12 PM      Result Value Ref Range   Glucose-Capillary 115 (*) 70 -  99 mg/dL  GLUCOSE, CAPILLARY     Status: Abnormal   Collection Time    12/07/13  4:28 PM      Result Value Ref Range   Glucose-Capillary 115 (*) 70 - 99 mg/dL  GLUCOSE, CAPILLARY     Status: Abnormal   Collection Time    12/07/13  9:20 PM      Result Value Ref Range   Glucose-Capillary 108 (*) 70 - 99 mg/dL  GLUCOSE, CAPILLARY     Status: Abnormal   Collection Time    12/08/13  7:36 AM      Result Value Ref Range   Glucose-Capillary 108 (*) 70 - 99 mg/dL  GLUCOSE, CAPILLARY     Status: Abnormal   Collection Time    12/08/13 12:16 PM      Result Value Ref Range   Glucose-Capillary 114 (*) 70 - 99 mg/dL  GLUCOSE, CAPILLARY     Status: Abnormal   Collection Time    12/08/13  4:38 PM      Result Value Ref Range   Glucose-Capillary 129 (*) 70 - 99 mg/dL  GLUCOSE, CAPILLARY     Status: Abnormal   Collection Time    12/08/13  9:10 PM      Result Value Ref Range    Glucose-Capillary 120 (*) 70 - 99 mg/dL  GLUCOSE, CAPILLARY     Status: Abnormal   Collection Time    12/09/13  7:36 AM      Result Value Ref Range   Glucose-Capillary 117 (*) 70 - 99 mg/dL     HEENT: normal Cardio: RRR and no murmurs Resp: CTA B/L and unlabored GI: BS positive and NT,ND Extremity:  No Edema Skin:   Intact Neuro: Alert/Oriented, Cranial Nerve II-XII normal, Normal Sensory, Abnormal Motor 4- Left denltoid , bi, tri,grip, 4/5 B HF, KE, ADF, 5/5  RUE and Abnormal FMC Ataxic/ dec FMC Musc/Skel:  Normal Gen NAD   Assessment/Plan: 1. Functional deficits secondary to Right embolic MCA infarct which require 3+ hours per day of interdisciplinary therapy in a comprehensive inpatient rehab setting. Physiatrist is providing close team supervision and 24 hour management of active medical problems listed below. Physiatrist and rehab team continue to assess barriers to discharge/monitor patient progress toward functional and medical goals. Plan D/C in am FIM: FIM - Bathing Bathing Steps Patient Completed: Chest;Right Arm;Left Arm;Abdomen;Front perineal area;Buttocks;Right upper leg;Left upper leg;Right lower leg (including foot);Left lower leg (including foot) Bathing: 5: Supervision: Safety issues/verbal cues  FIM - Upper Body Dressing/Undressing Upper body dressing/undressing steps patient completed: Thread/unthread right sleeve of pullover shirt/dresss;Thread/unthread left sleeve of pullover shirt/dress;Put head through opening of pull over shirt/dress;Pull shirt over trunk Upper body dressing/undressing: 5: Supervision: Safety issues/verbal cues FIM - Lower Body Dressing/Undressing Lower body dressing/undressing steps patient completed: Thread/unthread right underwear leg;Thread/unthread left underwear leg;Pull underwear up/down;Thread/unthread right pants leg;Thread/unthread left pants leg;Pull pants up/down;Don/Doff right sock;Don/Doff left sock;Fasten/unfasten left  shoe;Fasten/unfasten pants;Don/Doff right shoe;Don/Doff left shoe;Fasten/unfasten right shoe Lower body dressing/undressing: 5: Supervision: Safety issues/verbal cues  FIM - Toileting Toileting steps completed by patient: Adjust clothing prior to toileting;Performs perineal hygiene;Adjust clothing after toileting Toileting Assistive Devices: Grab bar or rail for support Toileting: 5: Supervision: Safety issues/verbal cues  FIM - Radio producer Devices: Elevated toilet seat;Grab bars Toilet Transfers: 0-Activity did not occur  FIM - Control and instrumentation engineer Devices: Arm rests;Walker Bed/Chair Transfer: 0: Activity did not occur  FIM - Locomotion: Wheelchair Distance: 120 Locomotion: Wheelchair: 0: Activity  did not occur FIM - Locomotion: Ambulation Locomotion: Ambulation Assistive Devices: Walker - Rolling Ambulation/Gait Assistance: 5: Supervision Locomotion: Ambulation: 5: Travels 150 ft or more with supervision/safety issues  Comprehension Comprehension Mode: Auditory Comprehension: 7-Follows complex conversation/direction: With no assist  Expression Expression Mode: Verbal Expression: 7-Expresses complex ideas: With no assist  Social Interaction Social Interaction: 7-Interacts appropriately with others - No medications needed.  Problem Solving Problem Solving: 7-Solves complex problems: Recognizes & self-corrects  Memory Memory: 7-Complete Independence: No helper  Medical Problem List and Plan:  1. Functional deficits secondary to right MCA infarct felt to be embolic, has loop recorder , f/u as outpt with Cards 2. DVT Prophylaxis/Anticoagulation: Subcutaneous Lovenox. Monitor platelet counts and any signs of bleeding  3. Pain Management: Tylenol as needed  5. Neuropsych: This patient is capable of making decisions on his own behalf.  6. Hypertension. Coreg 6.25 mg twice a day. Monitor with increased mobility and  adjust appropriately. Add ACE I          7. Newly diagnosed diabetes mellitus. Hemoglobin A1c 7.4. Off glucophage due to diarrhea Provide diabetic teaching. Check blood sugars a.c. and at bedtime. CBGs look ok on diet  8. Hyperlipidemia. Lipitor 9.  Constipation-now with loose stools r/t laxative use , adjust meds   LOS (Days) 9 A FACE TO FACE EVALUATION WAS PERFORMED  Charlett Blake 12/09/2013, 10:10 AM

## 2013-12-09 NOTE — Progress Notes (Signed)
Social Work Patient ID: Olga Coaster, male   DOB: February 03, 1941, 73 y.o.   MRN: [id number]  Silvestre Mesi Webber Michiels, LCSW Social Worker Signed  Patient Care Conference Service date: 12/09/2013 11:56 AM  Inpatient RehabilitationTeam Conference and Plan of Care Update Date: 12/08/2013   Time: 11:25 AM     Patient Name: Scott Conley       Medical Record Number: [id number]   Date of Birth: Aug 05, 1940 Sex: Male         Room/Bed: 4W05C/4W05C-01 Payor Info: Payor: MEDICARE / Plan: MEDICARE PART A AND B / Product Type: *No Product type* /   Admitting Diagnosis: CVA   Admit Date/Time:  11/30/2013  1:47 PM Admission Comments: No comment available   Primary Diagnosis:  <principal problem not specified> Principal Problem: <principal problem not specified>    Patient Active Problem List     Diagnosis  Date Noted   .  CVA (cerebral infarction)  11/30/2013   .  Stroke  11/26/2013     Expected Discharge Date: Expected Discharge Date: 12/10/13  Team Members Present: Physician leading conference: Dr. Alysia Penna Social Worker Present: Alfonse Alpers, LCSW Nurse Present: Elliot Cousin, RN PT Present: Cameron Sprang, Cecille Rubin, PT OT Present: Clyda Greener, OT SLP Present: Gunnar Fusi, SLP PPS Coordinator present : Daiva Nakayama, RN, CRRN;Becky Alwyn Ren, PT        Current Status/Progress  Goal  Weekly Team Focus   Medical     Diarrhea resolved now with constipation, left-sided weakness improving  Normalize bowel function  Discharge planning   Bowel/Bladder     Continent of bowel and bladder, per report [facility] 12/05/13, sorbitol prn for constipation, uses urinal   remain continet with no reports of accidents   continue to monitor bowel pattern and bladder   Swallow/Nutrition/ Hydration            ADL's     Pt is currently supervision approaching modified independent level for all selfcare tasks and functional transfers.  overall modified independent  selfcare re-training, LUE  neuromuscular re-education, balance retraining, pt education   Mobility     Pt is currently supervision overall for mobility  all goals are Mod I  balance, gait, stairs, pt/family education   Communication            Safety/Cognition/ Behavioral Observations           Pain     no c/o pain  pain remain less than 2  assess pain q shift and prn   Skin     skin CDI  no new skin breakdown or signs of infectiont his admission  assess skin q shift     Rehab Goals Patient on target to meet rehab goals: Yes Rehab Goals Revised: None *See Care Plan and progress notes for long and short-term goals.    Barriers to Discharge:  Still requiring assistance for mobility      Possible Resolutions to Barriers:    Upgrade to modified independent vs. caregiver training      Discharge Planning/Teaching Needs:    Pt will return to his home with family support.  Pt will have 24/7 coverage for first week post d/c.   No family education required at this time.    Team Discussion:    Pt is doing well medically.  MD is starting pt on senokot-s as he is having trouble with constipation.  Pt's therapies are going well and pt remains on target for 12-10-13 d/c.  Pt continues  to have some discoordination with his left hand per OT.  Per PT, pt will be modified independent in the room on 12-09-13.   Revisions to Treatment Plan:    None    Continued Need for Acute Rehabilitation Level of Care: The patient requires daily medical management by a physician with specialized training in physical medicine and rehabilitation for the following conditions: Daily direction of a multidisciplinary physical rehabilitation program to ensure safe treatment while eliciting the highest outcome that is of practical value to the patient.: Yes Daily medical management of patient stability for increased activity during participation in an intensive rehabilitation regime.: Yes Daily analysis of laboratory values and/or radiology reports  with any subsequent need for medication adjustment of medical intervention for : Neurological problems;Other  Silvestre Mesi Matthias Bogus 12/09/2013, 11:56 AM

## 2013-12-09 NOTE — Patient Care Conference (Signed)
Inpatient RehabilitationTeam Conference and Plan of Care Update Date: 12/08/2013   Time: 11:25 AM    Patient Name: Scott Conley      Medical Record Number: [id number]  Date of Birth: July 15, 1941 Sex: Male         Room/Bed: 4W05C/4W05C-01 Payor Info: Payor: MEDICARE / Plan: MEDICARE PART A AND B / Product Type: *No Product type* /    Admitting Diagnosis: CVA  Admit Date/Time:  11/30/2013  1:47 PM Admission Comments: No comment available   Primary Diagnosis:  <principal problem not specified> Principal Problem: <principal problem not specified>  Patient Active Problem List   Diagnosis Date Noted  . CVA (cerebral infarction) 11/30/2013  . Stroke 11/26/2013    Expected Discharge Date: Expected Discharge Date: 12/10/13  Team Members Present: Physician leading conference: Dr. Alysia Penna Social Worker Present: Alfonse Alpers, LCSW Nurse Present: Elliot Cousin, RN PT Present: Cameron Sprang, Cecille Rubin, PT OT Present: Clyda Greener, OT SLP Present: Gunnar Fusi, SLP PPS Coordinator present : Daiva Nakayama, RN, CRRN;Becky Alwyn Ren, PT     Current Status/Progress Goal Weekly Team Focus  Medical   Diarrhea resolved now with constipation, left-sided weakness improving  Normalize bowel function  Discharge planning   Bowel/Bladder   Continent of bowel and bladder, per report [facility] 12/05/13, sorbitol prn for constipation, uses urinal   remain continet with no reports of accidents   continue to monitor bowel pattern and bladder   Swallow/Nutrition/ Hydration             ADL's   Pt is currently supervision approaching modified independent level for all selfcare tasks and functional transfers.  overall modified independent  selfcare re-training, LUE neuromuscular re-education, balance retraining, pt education   Mobility   Pt is currently supervision overall for mobility  all goals are Mod I  balance, gait, stairs, pt/family education   Communication             Safety/Cognition/  Behavioral Observations            Pain   no c/o pain  pain remain less than 2  assess pain q shift and prn   Skin   skin CDI  no new skin breakdown or signs of infectiont his admission  assess skin q shift     Rehab Goals Patient on target to meet rehab goals: Yes Rehab Goals Revised: None *See Care Plan and progress notes for long and short-term goals.  Barriers to Discharge: Still requiring assistance for mobility    Possible Resolutions to Barriers:  Upgrade to modified independent vs. caregiver training    Discharge Planning/Teaching Needs:  Pt will return to his home with family support.  Pt will have 24/7 coverage for first week post d/c.  No family education required at this time.   Team Discussion:  Pt is doing well medically.  MD is starting pt on senokot-s as he is having trouble with constipation.  Pt's therapies are going well and pt remains on target for 12-10-13 d/c.  Pt continues to have some discoordination with his left hand per OT.  Per PT, pt will be modified independent in the room on 12-09-13.  Revisions to Treatment Plan:  None   Continued Need for Acute Rehabilitation Level of Care: The patient requires daily medical management by a physician with specialized training in physical medicine and rehabilitation for the following conditions: Daily direction of a multidisciplinary physical rehabilitation program to ensure safe treatment while eliciting the highest outcome that is of  practical value to the patient.: Yes Daily medical management of patient stability for increased activity during participation in an intensive rehabilitation regime.: Yes Daily analysis of laboratory values and/or radiology reports with any subsequent need for medication adjustment of medical intervention for : Neurological problems;Other  [name] 12/09/2013, 11:56 AM

## 2013-12-09 NOTE — Progress Notes (Signed)
Physical Therapy Discharge Summary  Patient Details  Name: Scott Conley MRN: [id number] Date of Birth: 1940/12/14  Today's Date: 12/09/2013 Time: 0947-0962 Time Calculation (min): 57 min  Patient has met 9 of 9 long term goals due to improved activity tolerance, improved balance, improved postural control, increased strength, functional use of  left upper extremity and left lower extremity and improved coordination.  Patient to discharge at an ambulatory level Modified Independent.  Pt's family did not participate in formal family training due to being Mod I at D/C, however pts daughter will be available for 1 week following D/C in order to assist if needed.   Reasons goals not met: n/a  Recommendation:  Patient will benefit from ongoing skilled PT services in outpatient setting to continue to advance safe functional mobility, address ongoing impairments in balance, decreased coordination, decreased overall strength and endurance, and minimize fall risk.  Equipment: RW  Reasons for discharge: treatment goals met and discharge from hospital  Patient/family agrees with progress made and goals achieved: Yes  PT Treatment/Intervention:  Pt received sitting in w/c in room, agreeable to therapy.  Focus of session was checking off goals with car transfers, bed mobility, floor transfer, stair negotiation for community use, gait in controlled and home environment, balance and HEP.  Performed all mentioned above at Mod I level with exception of floor transfer at supervision level for min cues for safety and technique, and HEP.  Provided pt with HEP including strengthening and balance activities; seated LAQs x 10 reps BLE, standing knee flex x 10 reps BLE, standing hip abd x 10 reps BLE, mini squats x 10 reps, tandem stance with UE support (working on decreased UE support), and sit <> stand x 5 reps without UE support.  Worked shortly on balance with L foot planted on red balance disc and advancing  and retrostepping RLE with min assist.  Pt ambulated back to room and left in w/c with all needs in reach.  (Pt now Mod I in room).   PT Discharge Precautions/Restrictions Precautions Precautions: Fall Precaution Comments: LUE and LLE mild hemiparesis Restrictions Weight Bearing Restrictions: No Vital Signs Therapy Vitals Temp: 97.8 F (36.6 C) Temp src: Oral Pulse Rate: 59 Resp: 17 BP: 125/74 mmHg Patient Position, if appropriate: Sitting Oxygen Therapy SpO2: 97 % O2 Device: None (Room air) Pain Pain Assessment Pain Assessment: No/denies pain Pain Score: 0-No pain Vision/Perception  Vision - Assessment Eye Alignment: Within Functional Limits Alignment/Gaze Preference: Within Defined Limits Tracking/Visual Pursuits: Able to track stimulus in all quads without difficulty Saccades: Within functional limits Convergence: Within functional limits  Cognition Overall Cognitive Status: Within Functional Limits for tasks assessed Arousal/Alertness: Awake/alert Orientation Level: Oriented X4 Attention: Focused;Sustained;Selective;Alternating Focused Attention: Appears intact Sustained Attention: Appears intact Selective Attention: Appears intact Alternating Attention: Appears intact Memory: Impaired Memory Impairment: Decreased recall of new information Awareness: Appears intact Safety/Judgment: Appears intact Sensation Sensation Light Touch: Appears Intact Stereognosis: Not tested Hot/Cold: Not tested Proprioception: Appears Intact Coordination Gross Motor Movements are Fluid and Coordinated: No (not in LUE) Fine Motor Movements are Fluid and Coordinated: No (not in LUE) Heel Shin Test: Grossly WFL, however not able to perform full ROM Motor  Motor Motor: Hemiplegia;Abnormal postural alignment and control Motor - Discharge Observations: Pt continues to demonstrate decreased coordination in LUE>LE, however has shown marked improvement in functional tasks.  Also  continue to note forward flexed posture with gait, esp when fatigued or without AD  Mobility Bed Mobility Bed Mobility: Supine to Sit;Sit  to Supine Supine to Sit: 6: Modified independent (Device/Increase time) Sit to Supine: 6: Modified independent (Device/Increase time) Transfers Transfers: Yes Sit to Stand: 6: Modified independent (Device/Increase time) Stand to Sit: 6: Modified independent (Device/Increase time) Stand Pivot Transfers: 6: Modified independent (Device/Increase time) Locomotion  Ambulation Ambulation: Yes Ambulation/Gait Assistance: 6: Modified independent (Device/Increase time) Ambulation Distance (Feet): 200 Feet Assistive device: Rolling walker Gait Gait: Yes Gait Pattern: Impaired Gait Pattern: Trunk flexed;Step-through pattern;Decreased stride length;Narrow base of support Stairs / Additional Locomotion Stairs: Yes Stairs Assistance: 5: Supervision Stairs Assistance Details: Verbal cues for precautions/safety Stair Management Technique: One rail Right;Step to pattern;Forwards Number of Stairs: 10 Height of Stairs: 6 Wheelchair Mobility Wheelchair Mobility: No (pt ambulatory at D/C) Distance:  (pt ambulatory at D/C)  Trunk/Postural Assessment  Cervical Assessment Cervical Assessment: Within Functional Limits Thoracic Assessment Thoracic Assessment: Exceptions to [facility] Thoracic Strength Overall Thoracic Strength Comments: Pt with slight thoracic kyphosis in standing. Lumbar Assessment Lumbar Assessment: Exceptions to [facility] Lumbar Strength Overall Lumbar Strength Comments: Pt demonstrates posterior pelvic tilt during sitting.  Postural Control Postural Limitations: Pt demonstrates forward flexed trunk, protracted shoulders and forward head during gait training.    Balance Balance Balance Assessed: Yes Standardized Balance Assessment Standardized Balance Assessment: Berg Balance Test Berg Balance Test Sit to Stand: Able to stand without using hands and  stabilize independently Standing Unsupported: Able to stand safely 2 minutes Sitting with Back Unsupported but Feet Supported on Floor or Stool: Able to sit safely and securely 2 minutes Stand to Sit: Sits safely with minimal use of hands Transfers: Able to transfer safely, minor use of hands Standing Unsupported with Eyes Closed: Able to stand 10 seconds safely Standing Ubsupported with Feet Together: Able to place feet together independently and stand 1 minute safely From Standing, Reach Forward with Outstretched Arm: Can reach confidently >25 cm (10") From Standing Position, Pick up Object from Floor: Able to pick up shoe safely and easily From Standing Position, Turn to Look Behind Over each Shoulder: Looks behind one side only/other side shows less weight shift Turn 360 Degrees: Able to turn 360 degrees safely but slowly Standing Unsupported, Alternately Place Feet on Step/Stool: Able to complete 4 steps without aid or supervision Standing Unsupported, One Foot in Front: Able to take small step independently and hold 30 seconds Standing on One Leg: Tries to lift leg/unable to hold 3 seconds but remains standing independently Total Score: 46 Static Sitting Balance Static Sitting - Balance Support: Feet supported;Bilateral upper extremity supported Static Sitting - Level of Assistance: 6: Modified independent (Device/Increase time) Dynamic Sitting Balance Dynamic Sitting - Balance Support: Feet supported;During functional activity Dynamic Sitting - Level of Assistance: 6: Modified independent (Device/Increase time) Static Standing Balance Static Standing - Balance Support: During functional activity Static Standing - Level of Assistance: 6: Modified independent (Device/Increase time) Dynamic Standing Balance Dynamic Standing - Balance Support: During functional activity Dynamic Standing - Level of Assistance: 6: Modified independent (Device/Increase time) Extremity Assessment       RLE Assessment RLE Assessment: Within Functional Limits LLE Assessment LLE Assessment: Within Functional Limits (grossly 3+ to 4/5)  See FIM for current functional status  Raquel Sarna A Fredick Schlosser 12/09/2013, 4:21 PM

## 2013-12-10 ENCOUNTER — Encounter (HOSPITAL_COMMUNITY): Payer: Self-pay | Admitting: *Deleted

## 2013-12-10 DIAGNOSIS — I6330 Cerebral infarction due to thrombosis of unspecified cerebral artery: Secondary | ICD-10-CM

## 2013-12-10 LAB — GLUCOSE, CAPILLARY: Glucose-Capillary: 106 mg/dL — ABNORMAL HIGH (ref 70–99)

## 2013-12-10 MED ORDER — ATORVASTATIN CALCIUM 80 MG PO TABS: 80.0000 mg | ORAL_TABLET | Freq: Every day | ORAL | Status: DC

## 2013-12-10 MED ORDER — RAMIPRIL 2.5 MG PO CAPS: 2.5000 mg | ORAL_CAPSULE | Freq: Every day | ORAL | Status: DC

## 2013-12-10 MED ORDER — CARVEDILOL 6.25 MG PO TABS: 6.2500 mg | ORAL_TABLET | Freq: Two times a day (BID) | ORAL | Status: DC

## 2013-12-10 MED ORDER — ASPIRIN 325 MG PO TABS: 325.0000 mg | ORAL_TABLET | Freq: Every day | ORAL | Status: DC

## 2013-12-10 MED ORDER — SENNOSIDES-DOCUSATE SODIUM 8.6-50 MG PO TABS: 1.0000 | ORAL_TABLET | Freq: Every day | ORAL | Status: DC

## 2013-12-10 NOTE — Progress Notes (Signed)
Discharge to home accompanied by dtr. Discharge instructions given to pt via [place] PAC. HAndouts given for medications. No questions noted. Margarito Liner

## 2013-12-10 NOTE — Discharge Instructions (Signed)
Inpatient Rehab Discharge Instructions  SKEETER SHEARD Discharge date and time: 12/10/13   Activities/Precautions/ Functional Status: Activity: activity as tolerated Diet: diabetic diet Wound Care: none needed Functional status:  ___ No restrictions     ___ Walk up steps independently ___ 24/7 supervision/assistance   ___ Walk up steps with assistance _X__ Intermittent supervision/assistance  _X__ Bathe/dress independently ___ Walk with walker     ___ Bathe/dress with assistance ___ Walk Independently    ___ Shower independently ___ Walk with assistance    ___ Shower with assistance _X__ No alcohol     ___ Return to work/school ________  COMMUNITY REFERRALS UPON DISCHARGE:   Home Health:   PT     OT  Agency:  [facility] Phone:  [phone] Medical Equipment/Items Ordered: wide rolling walker and shower seat  Agency/Supplier:  [facility]     Phone:  [phone]  GENERAL COMMUNITY RESOURCES FOR PATIENT/FAMILY: Support Groups:  [facility] Support [facility]                                Meets the 3rd Sunday of the month (except June, July, and August)                                   3:00 Pm in the [place] of the Inpatient Rehab Unit at [facility]  Special Instructions: Followup cardiology services Dr. Caryl Comes in relations to loop recorder [place] Cigarette smoking nearly doubles your risk of having a stroke & is the single most alterable risk factor  If you smoke or have smoked in the last 12 months, you are advised to quit smoking for your health.  Most of the excess cardiovascular risk related to smoking disappears within a year of stopping.  Ask you doctor about anti-smoking medications  [place] Quit Line: 1-800-QUIT NOW  Free Smoking Cessation Classes [phone]  CHOLESTEROL Know your levels; limit fat & cholesterol in your diet  Lipid Panel     Component Value Date/Time   CHOL 196 11/27/2013 0521   TRIG 89  11/27/2013 0521   HDL 43 11/27/2013 0521   CHOLHDL 4.6 11/27/2013 0521   VLDL 18 11/27/2013 0521   LDLCALC 135* 11/27/2013 0521      Many patients benefit from treatment even if their cholesterol is at goal.  Goal: Total Cholesterol (CHOL) less than 160  Goal:  Triglycerides (TRIG) less than 150  Goal:  HDL greater than 40  Goal:  LDL (LDLCALC) less than 100   BLOOD PRESSURE American Stroke Association blood pressure target is less that 120/80 mm/Hg  Your discharge blood pressure is:  BP: 136/79 mmHg  Monitor your blood pressure  Limit your salt and alcohol intake  Many individuals will require more than one medication for high blood pressure  DIABETES (A1c is a blood sugar average for last 3 months) Goal HGBA1c is under 7% (HBGA1c is blood sugar average for last 3 months)  Diabetes:    Lab Results  Component Value Date   HGBA1C 7.4* 11/27/2013     Your HGBA1c can be lowered with medications, healthy diet, and exercise.  Check your blood sugar as directed by your physician  Call your physician if you experience unexplained or low blood sugars.  PHYSICAL ACTIVITY/REHABILITATION Goal is 30 minutes at least 4 days per week  Activity: Increase activity slowly, and No driving, Therapies: Physical Therapy: Home Health Return to work: N/A  Activity decreases your risk of heart attack and stroke and makes your heart stronger.  It helps control your weight and blood pressure; helps you relax and can improve your mood.  Participate in a regular exercise program.  Talk with your doctor about the best form of exercise for you (dancing, walking, swimming, cycling).  DIET/WEIGHT Goal is to maintain a healthy weight  Your discharge diet is: Carb Control  liquids Your height is:  Height: 6\' 4"  (193 cm) Your current weight is: Weight: 111.6 kg (246 lb 0.5 oz) Your Body Mass Index (BMI) is:  BMI (Calculated): 28.6  Following the type of diet specifically designed for you will help prevent  another stroke.  Your goal weight is:  205 lbs  Your goal Body Mass Index (BMI) is 19-24.  Healthy food habits can help reduce 3 risk factors for stroke:  High cholesterol, hypertension, and excess weight.  RESOURCES Stroke/Support Group:  Call [phone]   STROKE EDUCATION PROVIDED/REVIEWED AND GIVEN TO PATIENT Stroke warning signs and symptoms How to activate emergency medical system (call 911). Medications prescribed at discharge. Need for follow-up after discharge. Personal risk factors for stroke. Pneumonia vaccine given:  Flu vaccine given:  My questions have been answered, the writing is legible, and I understand these instructions.  I will adhere to these goals & educational materials that have been provided to me after my discharge from the hospital.       My questions have been answered and I understand these instructions. I will adhere to these goals and the provided educational materials after my discharge from the hospital.  Patient/Caregiver Signature _______________________________ Date __________  Clinician Signature _______________________________________ Date __________  Please bring this form and your medication list with you to all your follow-up doctor's appointments.

## 2013-12-10 NOTE — Progress Notes (Signed)
Subjective/Complaints: No new issues Review of Systems - Negative except feels week Objective: Vital Signs: Blood pressure 136/79, pulse 56, temperature 97.7 F (36.5 C), temperature source Oral, resp. rate 18, height 6\' 4"  (1.93 m), weight 111.6 kg (246 lb 0.5 oz), SpO2 98.00%. No results found. Results for orders placed during the hospital encounter of 11/30/13 (from the past 72 hour(s))  GLUCOSE, CAPILLARY     Status: Abnormal   Collection Time    12/07/13 12:12 PM      Result Value Ref Range   Glucose-Capillary 115 (*) 70 - 99 mg/dL  GLUCOSE, CAPILLARY     Status: Abnormal   Collection Time    12/07/13  4:28 PM      Result Value Ref Range   Glucose-Capillary 115 (*) 70 - 99 mg/dL  GLUCOSE, CAPILLARY     Status: Abnormal   Collection Time    12/07/13  9:20 PM      Result Value Ref Range   Glucose-Capillary 108 (*) 70 - 99 mg/dL  GLUCOSE, CAPILLARY     Status: Abnormal   Collection Time    12/08/13  7:36 AM      Result Value Ref Range   Glucose-Capillary 108 (*) 70 - 99 mg/dL  GLUCOSE, CAPILLARY     Status: Abnormal   Collection Time    12/08/13 12:16 PM      Result Value Ref Range   Glucose-Capillary 114 (*) 70 - 99 mg/dL  GLUCOSE, CAPILLARY     Status: Abnormal   Collection Time    12/08/13  4:38 PM      Result Value Ref Range   Glucose-Capillary 129 (*) 70 - 99 mg/dL  GLUCOSE, CAPILLARY     Status: Abnormal   Collection Time    12/08/13  9:10 PM      Result Value Ref Range   Glucose-Capillary 120 (*) 70 - 99 mg/dL  GLUCOSE, CAPILLARY     Status: Abnormal   Collection Time    12/09/13  7:36 AM      Result Value Ref Range   Glucose-Capillary 117 (*) 70 - 99 mg/dL  GLUCOSE, CAPILLARY     Status: Abnormal   Collection Time    12/09/13 12:07 PM      Result Value Ref Range   Glucose-Capillary 115 (*) 70 - 99 mg/dL  GLUCOSE, CAPILLARY     Status: None   Collection Time    12/09/13  4:29 PM      Result Value Ref Range   Glucose-Capillary 97  70 - 99 mg/dL   GLUCOSE, CAPILLARY     Status: Abnormal   Collection Time    12/09/13  9:28 PM      Result Value Ref Range   Glucose-Capillary 109 (*) 70 - 99 mg/dL   Comment 1 Notify RN    GLUCOSE, CAPILLARY     Status: Abnormal   Collection Time    12/10/13  7:23 AM      Result Value Ref Range   Glucose-Capillary 106 (*) 70 - 99 mg/dL   Comment 1 Notify RN       HEENT: normal Cardio: RRR and no murmurs Resp: CTA B/L and unlabored GI: BS positive and NT,ND Extremity:  No Edema Skin:   Intact Neuro: Alert/Oriented, Cranial Nerve II-XII normal, Normal Sensory, Abnormal Motor 4- Left denltoid , bi, tri,grip, 4/5 B HF, KE, ADF, 5/5  RUE and Abnormal FMC Ataxic/ dec FMC Musc/Skel:  Normal Gen NAD   Assessment/Plan:  1. Functional deficits secondary to Right embolic MCA infarct Stable for D/C today F/u PCP in 1-2 weeks F/u PM&R 3 weeks See D/C summary See D/C instructions FIM: FIM - Bathing Bathing Steps Patient Completed: Chest;Right Arm;Left Arm;Abdomen;Front perineal area;Buttocks;Right upper leg;Left upper leg;Right lower leg (including foot);Left lower leg (including foot) Bathing: 6: More than reasonable amount of time  FIM - Upper Body Dressing/Undressing Upper body dressing/undressing steps patient completed: Thread/unthread right sleeve of pullover shirt/dresss;Thread/unthread left sleeve of pullover shirt/dress;Put head through opening of pull over shirt/dress;Pull shirt over trunk Upper body dressing/undressing: 6: More than reasonable amount of time FIM - Lower Body Dressing/Undressing Lower body dressing/undressing steps patient completed: Thread/unthread right underwear leg;Thread/unthread left underwear leg;Pull underwear up/down;Thread/unthread right pants leg;Thread/unthread left pants leg;Pull pants up/down;Don/Doff right sock;Don/Doff left sock;Fasten/unfasten left shoe;Fasten/unfasten pants;Don/Doff right shoe;Don/Doff left shoe;Fasten/unfasten right shoe Lower body  dressing/undressing: 6: More than reasonable amount of time  FIM - Toileting Toileting steps completed by patient: Adjust clothing prior to toileting;Performs perineal hygiene;Adjust clothing after toileting Toileting Assistive Devices: Grab bar or rail for support Toileting: 6: More than reasonable amount of time  FIM - Radio producer Devices: Elevated toilet seat;Grab bars;Walker Toilet Transfers: 6-More than reasonable amt of time  FIM - Engineer, site: Copy: 6: Bed > Chair or W/C: No assist;6: Chair or W/C > Bed: No assist  FIM - Locomotion: Electrical engineer:  (pt ambulatory at D/C) Locomotion: Wheelchair: 0: Activity did not occur (pt ambulatory) FIM - Locomotion: Ambulation Locomotion: Ambulation Assistive Devices: Administrator Ambulation/Gait Assistance: 6: Modified independent (Device/Increase time) Locomotion: Ambulation: 6: Travels 150 ft or more with assistive device/no helper  Comprehension Comprehension Mode: Auditory Comprehension: 7-Follows complex conversation/direction: With no assist  Expression Expression Mode: Verbal Expression: 7-Expresses complex ideas: With no assist  Social Interaction Social Interaction: 7-Interacts appropriately with others - No medications needed.  Problem Solving Problem Solving: 7-Solves complex problems: Recognizes & self-corrects  Memory Memory: 7-Complete Independence: No helper  Medical Problem List and Plan:  1. Functional deficits secondary to right MCA infarct felt to be embolic, has loop recorder , f/u as outpt with Cards 2. DVT Prophylaxis/Anticoagulation: Subcutaneous Lovenox. Monitor platelet counts and any signs of bleeding  3. Pain Management: Tylenol as needed  5. Neuropsych: This patient is capable of making decisions on his own behalf.  6. Hypertension. Coreg 6.25 mg twice a day. Monitor with increased mobility and  adjust appropriately. Add ACE I          7. Newly diagnosed diabetes mellitus. Hemoglobin A1c 7.4. Off glucophage due to diarrhea Provide diabetic teaching. Check blood sugars a.c. and at bedtime. CBGs look ok on diet  8. Hyperlipidemia. Lipitor    LOS (Days) 10 A FACE TO FACE EVALUATION WAS PERFORMED  Charlett Blake 12/10/2013, 10:16 AM

## 2013-12-13 ENCOUNTER — Ambulatory Visit (INDEPENDENT_AMBULATORY_CARE_PROVIDER_SITE_OTHER): Payer: Medicare Other | Admitting: *Deleted

## 2013-12-13 DIAGNOSIS — I639 Cerebral infarction, unspecified: Secondary | ICD-10-CM

## 2013-12-13 DIAGNOSIS — I6350 Cerebral infarction due to unspecified occlusion or stenosis of unspecified cerebral artery: Secondary | ICD-10-CM

## 2013-12-13 LAB — MDC_IDC_ENUM_SESS_TYPE_INCLINIC
MDC IDC SESS DTM: 20150518111445
MDC IDC SET ZONE DETECTION INTERVAL: 2000 ms
Zone Setting Detection Interval: 3000 ms
Zone Setting Detection Interval: 380 ms

## 2013-12-13 NOTE — Progress Notes (Signed)
Wound check appointment. Steri-strips removed. Wound without redness or edema. Incision edges approximated, wound well healed. Normal device function. Pt with 0 tachy episodes; 0 brady episodes; 0 asystole. Pt followed via monthly [facility] summary reports. ROV w/ Dr. Caryl Comes PRN.

## 2013-12-14 DIAGNOSIS — I10 Essential (primary) hypertension: Secondary | ICD-10-CM | POA: Diagnosis not present

## 2013-12-14 DIAGNOSIS — E119 Type 2 diabetes mellitus without complications: Secondary | ICD-10-CM | POA: Diagnosis not present

## 2013-12-14 DIAGNOSIS — Z5189 Encounter for other specified aftercare: Secondary | ICD-10-CM | POA: Diagnosis not present

## 2013-12-14 DIAGNOSIS — I69959 Hemiplegia and hemiparesis following unspecified cerebrovascular disease affecting unspecified side: Secondary | ICD-10-CM | POA: Diagnosis not present

## 2013-12-16 DIAGNOSIS — Z5189 Encounter for other specified aftercare: Secondary | ICD-10-CM | POA: Diagnosis not present

## 2013-12-16 DIAGNOSIS — E119 Type 2 diabetes mellitus without complications: Secondary | ICD-10-CM | POA: Diagnosis not present

## 2013-12-16 DIAGNOSIS — I69959 Hemiplegia and hemiparesis following unspecified cerebrovascular disease affecting unspecified side: Secondary | ICD-10-CM | POA: Diagnosis not present

## 2013-12-16 DIAGNOSIS — I10 Essential (primary) hypertension: Secondary | ICD-10-CM | POA: Diagnosis not present

## 2013-12-21 DIAGNOSIS — I10 Essential (primary) hypertension: Secondary | ICD-10-CM | POA: Diagnosis not present

## 2013-12-21 DIAGNOSIS — E119 Type 2 diabetes mellitus without complications: Secondary | ICD-10-CM | POA: Diagnosis not present

## 2013-12-21 DIAGNOSIS — I69959 Hemiplegia and hemiparesis following unspecified cerebrovascular disease affecting unspecified side: Secondary | ICD-10-CM | POA: Diagnosis not present

## 2013-12-21 DIAGNOSIS — Z5189 Encounter for other specified aftercare: Secondary | ICD-10-CM | POA: Diagnosis not present

## 2013-12-23 ENCOUNTER — Encounter: Payer: Self-pay | Admitting: Internal Medicine

## 2013-12-23 DIAGNOSIS — I69959 Hemiplegia and hemiparesis following unspecified cerebrovascular disease affecting unspecified side: Secondary | ICD-10-CM | POA: Diagnosis not present

## 2013-12-23 DIAGNOSIS — I10 Essential (primary) hypertension: Secondary | ICD-10-CM | POA: Diagnosis not present

## 2013-12-23 DIAGNOSIS — Z5189 Encounter for other specified aftercare: Secondary | ICD-10-CM | POA: Diagnosis not present

## 2013-12-23 DIAGNOSIS — E119 Type 2 diabetes mellitus without complications: Secondary | ICD-10-CM | POA: Diagnosis not present

## 2013-12-24 DIAGNOSIS — I69959 Hemiplegia and hemiparesis following unspecified cerebrovascular disease affecting unspecified side: Secondary | ICD-10-CM | POA: Diagnosis not present

## 2013-12-24 DIAGNOSIS — Z5189 Encounter for other specified aftercare: Secondary | ICD-10-CM | POA: Diagnosis not present

## 2013-12-24 DIAGNOSIS — E119 Type 2 diabetes mellitus without complications: Secondary | ICD-10-CM | POA: Diagnosis not present

## 2013-12-24 DIAGNOSIS — I10 Essential (primary) hypertension: Secondary | ICD-10-CM | POA: Diagnosis not present

## 2013-12-25 DIAGNOSIS — Z5189 Encounter for other specified aftercare: Secondary | ICD-10-CM | POA: Diagnosis not present

## 2013-12-25 DIAGNOSIS — I69959 Hemiplegia and hemiparesis following unspecified cerebrovascular disease affecting unspecified side: Secondary | ICD-10-CM | POA: Diagnosis not present

## 2013-12-25 DIAGNOSIS — I10 Essential (primary) hypertension: Secondary | ICD-10-CM | POA: Diagnosis not present

## 2013-12-25 DIAGNOSIS — E119 Type 2 diabetes mellitus without complications: Secondary | ICD-10-CM | POA: Diagnosis not present

## 2013-12-27 DIAGNOSIS — E119 Type 2 diabetes mellitus without complications: Secondary | ICD-10-CM | POA: Diagnosis not present

## 2013-12-27 DIAGNOSIS — Z5189 Encounter for other specified aftercare: Secondary | ICD-10-CM | POA: Diagnosis not present

## 2013-12-27 DIAGNOSIS — I69959 Hemiplegia and hemiparesis following unspecified cerebrovascular disease affecting unspecified side: Secondary | ICD-10-CM | POA: Diagnosis not present

## 2013-12-27 DIAGNOSIS — I10 Essential (primary) hypertension: Secondary | ICD-10-CM | POA: Diagnosis not present

## 2013-12-29 DIAGNOSIS — E119 Type 2 diabetes mellitus without complications: Secondary | ICD-10-CM | POA: Diagnosis not present

## 2013-12-29 DIAGNOSIS — I69959 Hemiplegia and hemiparesis following unspecified cerebrovascular disease affecting unspecified side: Secondary | ICD-10-CM | POA: Diagnosis not present

## 2013-12-29 DIAGNOSIS — I10 Essential (primary) hypertension: Secondary | ICD-10-CM | POA: Diagnosis not present

## 2013-12-29 DIAGNOSIS — Z5189 Encounter for other specified aftercare: Secondary | ICD-10-CM | POA: Diagnosis not present

## 2013-12-30 DIAGNOSIS — Z5189 Encounter for other specified aftercare: Secondary | ICD-10-CM | POA: Diagnosis not present

## 2013-12-30 DIAGNOSIS — I69959 Hemiplegia and hemiparesis following unspecified cerebrovascular disease affecting unspecified side: Secondary | ICD-10-CM | POA: Diagnosis not present

## 2013-12-30 DIAGNOSIS — E782 Mixed hyperlipidemia: Secondary | ICD-10-CM | POA: Diagnosis not present

## 2013-12-30 DIAGNOSIS — I10 Essential (primary) hypertension: Secondary | ICD-10-CM | POA: Diagnosis not present

## 2013-12-30 DIAGNOSIS — E119 Type 2 diabetes mellitus without complications: Secondary | ICD-10-CM | POA: Diagnosis not present

## 2013-12-30 DIAGNOSIS — I679 Cerebrovascular disease, unspecified: Secondary | ICD-10-CM | POA: Diagnosis not present

## 2013-12-30 DIAGNOSIS — IMO0001 Reserved for inherently not codable concepts without codable children: Secondary | ICD-10-CM | POA: Diagnosis not present

## 2014-01-03 DIAGNOSIS — Z5189 Encounter for other specified aftercare: Secondary | ICD-10-CM | POA: Diagnosis not present

## 2014-01-03 DIAGNOSIS — I10 Essential (primary) hypertension: Secondary | ICD-10-CM | POA: Diagnosis not present

## 2014-01-03 DIAGNOSIS — E119 Type 2 diabetes mellitus without complications: Secondary | ICD-10-CM | POA: Diagnosis not present

## 2014-01-03 DIAGNOSIS — I69959 Hemiplegia and hemiparesis following unspecified cerebrovascular disease affecting unspecified side: Secondary | ICD-10-CM | POA: Diagnosis not present

## 2014-01-05 DIAGNOSIS — I10 Essential (primary) hypertension: Secondary | ICD-10-CM | POA: Diagnosis not present

## 2014-01-05 DIAGNOSIS — Z5189 Encounter for other specified aftercare: Secondary | ICD-10-CM | POA: Diagnosis not present

## 2014-01-05 DIAGNOSIS — I69959 Hemiplegia and hemiparesis following unspecified cerebrovascular disease affecting unspecified side: Secondary | ICD-10-CM | POA: Diagnosis not present

## 2014-01-05 DIAGNOSIS — E119 Type 2 diabetes mellitus without complications: Secondary | ICD-10-CM | POA: Diagnosis not present

## 2014-01-10 ENCOUNTER — Other Ambulatory Visit: Payer: Self-pay | Admitting: Physical Medicine and Rehabilitation

## 2014-01-10 DIAGNOSIS — I69959 Hemiplegia and hemiparesis following unspecified cerebrovascular disease affecting unspecified side: Secondary | ICD-10-CM | POA: Diagnosis not present

## 2014-01-10 DIAGNOSIS — Z5189 Encounter for other specified aftercare: Secondary | ICD-10-CM | POA: Diagnosis not present

## 2014-01-10 DIAGNOSIS — E119 Type 2 diabetes mellitus without complications: Secondary | ICD-10-CM | POA: Diagnosis not present

## 2014-01-10 DIAGNOSIS — I10 Essential (primary) hypertension: Secondary | ICD-10-CM | POA: Diagnosis not present

## 2014-01-12 ENCOUNTER — Ambulatory Visit (INDEPENDENT_AMBULATORY_CARE_PROVIDER_SITE_OTHER): Payer: Medicare Other | Admitting: *Deleted

## 2014-01-12 DIAGNOSIS — I6350 Cerebral infarction due to unspecified occlusion or stenosis of unspecified cerebral artery: Secondary | ICD-10-CM

## 2014-01-12 DIAGNOSIS — I639 Cerebral infarction, unspecified: Secondary | ICD-10-CM

## 2014-01-12 LAB — MDC_IDC_ENUM_SESS_TYPE_REMOTE

## 2014-01-13 ENCOUNTER — Encounter: Payer: Self-pay | Admitting: *Deleted

## 2014-01-18 ENCOUNTER — Encounter: Payer: Self-pay | Admitting: Physical Medicine & Rehabilitation

## 2014-01-18 ENCOUNTER — Ambulatory Visit (HOSPITAL_BASED_OUTPATIENT_CLINIC_OR_DEPARTMENT_OTHER): Payer: Medicare Other | Admitting: Physical Medicine & Rehabilitation

## 2014-01-18 ENCOUNTER — Encounter: Payer: Medicare Other | Attending: Physical Medicine & Rehabilitation

## 2014-01-18 VITALS — BP 120/68 | HR 65 | Resp 14 | Ht 76.0 in | Wt 235.4 lb

## 2014-01-18 DIAGNOSIS — E119 Type 2 diabetes mellitus without complications: Secondary | ICD-10-CM | POA: Insufficient documentation

## 2014-01-18 DIAGNOSIS — I69993 Ataxia following unspecified cerebrovascular disease: Secondary | ICD-10-CM | POA: Diagnosis not present

## 2014-01-18 DIAGNOSIS — G830 Diplegia of upper limbs: Secondary | ICD-10-CM | POA: Insufficient documentation

## 2014-01-18 DIAGNOSIS — I69965 Other paralytic syndrome following unspecified cerebrovascular disease, bilateral: Secondary | ICD-10-CM | POA: Diagnosis not present

## 2014-01-18 DIAGNOSIS — I6350 Cerebral infarction due to unspecified occlusion or stenosis of unspecified cerebral artery: Secondary | ICD-10-CM | POA: Diagnosis not present

## 2014-01-18 DIAGNOSIS — I10 Essential (primary) hypertension: Secondary | ICD-10-CM | POA: Diagnosis not present

## 2014-01-18 DIAGNOSIS — F17200 Nicotine dependence, unspecified, uncomplicated: Secondary | ICD-10-CM | POA: Diagnosis not present

## 2014-01-18 NOTE — Progress Notes (Signed)
Subjective:    Patient ID: Scott Conley, male    DOB: October 05, 1940, 73 y.o.   MRN: [id number]  HPI This is a 73 year old right-handed male on no scheduled medications, independent prior to admission, living alone. Presented Nov 26, 2013, with left-sided weakness and slurred speech. Noted elevated blood pressure in the 200s as well as findings of potassium 6.6.  MRI of the brain showed right hemispheric acute infarct. MRA of the head with atherosclerotic type changes.  Echocardiogram with ejection fraction of 60%, no wall motion abnormalities.  Carotid Dopplers with no ICA stenosis.  The patient did not receive tPA.  Coreg was added for blood pressure control  DATE OF ADMISSION:  11/30/2013 DATE OF DISCHARGE:  12/10/2013  Patient has had home health OT as well as PT. PT plans to come out until 01/24/2014, OT finished last week  Patient is independent with dressing and bathing. Daughter assists with cooking and cleaning  Has followed up with primary care physician since discharge from the Pain Inventory Average Pain 0 Pain Right Now 0 My pain is no pain  In the last 24 hours, has pain interfered with the following? General activity 0 Relation with others 0 Enjoyment of life 0 What TIME of day is your pain at its worst? no pain Sleep (in general) Good  Pain is worse with: no pain Pain improves with: no pain Relief from Meds: no pain  Mobility walk without assistance use a cane how many minutes can you walk? 10 ability to climb steps?  no do you drive?  no  Function retired  Neuro/Psych No problems in this area  Prior Studies Any changes since last visit?  no  Physicians involved in your care Any changes since last visit?  no   History reviewed. No pertinent family history. History   Social History  . Marital Status: Widowed    Spouse Name: N/A    Number of Children: N/A  . Years of Education: N/A   Social History Main Topics  . Smoking status:  Heavy Tobacco Smoker -- 0.50 packs/day for 58 years    Types: Cigarettes  . Smokeless tobacco: Never Used  . Alcohol Use: No     Comment: none since 1985  . Drug Use: No  . Sexual Activity: None   Other Topics Concern  . None   Social History Narrative  . None   Past Surgical History  Procedure Laterality Date  . Cyst removal leg      removed from right groin  . Loop recorder implant  11/29/13    MDT LinQ implanted by Dr Caryl Comes for cryptogenic stroke  . Tee without cardioversion N/A 11/29/2013    Procedure: TRANSESOPHAGEAL ECHOCARDIOGRAM (TEE);  Surgeon: Sueanne Margarita, MD;  Location: [facility] ENDOSCOPY;  Service: Cardiovascular;  Laterality: N/A;   Past Medical History  Diagnosis Date  . Blood clot in vein   . Arthritis   . Diabetes   . CVA (cerebral infarction)   . Hypertension    BP 120/68  Pulse 65  Resp 14  Ht 6\' 4"  (1.93 m)  Wt 235 lb 6.4 oz (106.777 kg)  BMI 28.67 kg/m2  SpO2 98%  Opioid Risk Score:   Fall Risk Score: Moderate Fall Risk (6-13 points) (educated and handout given for fall prevention in the home) Review of Systems  All other systems reviewed and are negative.      Objective:   Physical Exam Decreased fine motor with left finger to thumb opposition  Dysdiadochokinesis with rapid alternating supination and pronation of the left upper extremity Motor strength is 4/5 in the left deltoid, bicep, tricep, grip Motor strength is 5/5 in the right deltoid, bicep, tricep Sensation equal in bilateral upper extremities to pinprick Lower extremity strength is 5/5 bilateral hip flexor knee extensor ankle dorsiflexor  Ambulation is without toe drag or knee instability. No AFO, no assistive device except when outside      Assessment & Plan:  #1. Left upper extremity paresis status post right CVA 11/26/2013. Has made excellent progress.  patient may benefit from some OT as an outpatient however patient feels like he is doing well enough with his left hand. If he  changes his mind I can call in a referral  We discussed recommendations such as no smoking, walking on regular basis.  followup with primary care physician Dr. polite   Graduated return to driving instructions were provided. It is recommended that the patient first drives with another licensed driver in an empty parking lot. If the patient does well with this, and they can drive on a quiet street with the licensed driver. If the patient does well with this they can drive on a busy street with a licensed driver. If the patient does well with this, the next time out they can go by himself. For the first month after resuming driving, I recommend no nighttime or Interstate driving.  Over half of the 25 min visit was spent counseling and coordinating care.

## 2014-01-18 NOTE — Patient Instructions (Signed)

## 2014-01-21 NOTE — Progress Notes (Signed)
Loop recorder 

## 2014-01-27 DIAGNOSIS — E782 Mixed hyperlipidemia: Secondary | ICD-10-CM | POA: Diagnosis not present

## 2014-01-27 DIAGNOSIS — I10 Essential (primary) hypertension: Secondary | ICD-10-CM | POA: Diagnosis not present

## 2014-01-27 DIAGNOSIS — IMO0001 Reserved for inherently not codable concepts without codable children: Secondary | ICD-10-CM | POA: Diagnosis not present

## 2014-01-27 DIAGNOSIS — Z5189 Encounter for other specified aftercare: Secondary | ICD-10-CM | POA: Diagnosis not present

## 2014-01-27 DIAGNOSIS — I679 Cerebrovascular disease, unspecified: Secondary | ICD-10-CM | POA: Diagnosis not present

## 2014-01-27 DIAGNOSIS — I69959 Hemiplegia and hemiparesis following unspecified cerebrovascular disease affecting unspecified side: Secondary | ICD-10-CM | POA: Diagnosis not present

## 2014-01-27 DIAGNOSIS — E119 Type 2 diabetes mellitus without complications: Secondary | ICD-10-CM | POA: Diagnosis not present

## 2014-02-09 ENCOUNTER — Ambulatory Visit (INDEPENDENT_AMBULATORY_CARE_PROVIDER_SITE_OTHER): Payer: Medicare Other | Admitting: *Deleted

## 2014-02-09 DIAGNOSIS — I6350 Cerebral infarction due to unspecified occlusion or stenosis of unspecified cerebral artery: Secondary | ICD-10-CM | POA: Diagnosis not present

## 2014-02-09 DIAGNOSIS — I639 Cerebral infarction, unspecified: Secondary | ICD-10-CM

## 2014-02-09 LAB — MDC_IDC_ENUM_SESS_TYPE_REMOTE

## 2014-02-16 NOTE — Progress Notes (Signed)
Loop recorder 

## 2014-03-01 ENCOUNTER — Ambulatory Visit (INDEPENDENT_AMBULATORY_CARE_PROVIDER_SITE_OTHER): Payer: Medicare Other | Admitting: Nurse Practitioner

## 2014-03-01 ENCOUNTER — Encounter: Payer: Self-pay | Admitting: Nurse Practitioner

## 2014-03-01 VITALS — BP 160/88 | HR 70 | Ht 76.0 in | Wt 240.0 lb

## 2014-03-01 DIAGNOSIS — Z720 Tobacco use: Secondary | ICD-10-CM | POA: Insufficient documentation

## 2014-03-01 DIAGNOSIS — I69993 Ataxia following unspecified cerebrovascular disease: Secondary | ICD-10-CM | POA: Diagnosis not present

## 2014-03-01 DIAGNOSIS — I6340 Cerebral infarction due to embolism of unspecified cerebral artery: Secondary | ICD-10-CM | POA: Diagnosis not present

## 2014-03-01 DIAGNOSIS — I639 Cerebral infarction, unspecified: Secondary | ICD-10-CM

## 2014-03-01 MED ORDER — SENNOSIDES-DOCUSATE SODIUM 8.6-50 MG PO TABS: 1.0000 | ORAL_TABLET | Freq: Every day | ORAL | Status: DC

## 2014-03-01 NOTE — Patient Instructions (Addendum)
Continue aspirin 325 mg orally every day  for secondary stroke prevention and maintain strict control of hypertension with blood pressure goal below 130/90, diabetes with hemoglobin A1c goal below 6.5% and lipids with LDL cholesterol goal below 100 mg/dL.  Followup in the future with me in 4 months.  Stroke Prevention Some medical conditions and behaviors are associated with an increased chance of having a stroke. You may prevent a stroke by making healthy choices and managing medical conditions. HOW CAN I REDUCE MY RISK OF HAVING A STROKE?   Stay physically active. Get at least 30 minutes of activity on most or all days.  Do not smoke. It may also be helpful to avoid exposure to secondhand smoke.  Limit alcohol use. Moderate alcohol use is considered to be:  No more than 2 drinks per day for men.  No more than 1 drink per day for nonpregnant women.  Eat healthy foods. This involves:  Eating 5 or more servings of fruits and vegetables a day.  Making dietary changes that address high blood pressure (hypertension), high cholesterol, diabetes, or obesity.  Manage your cholesterol levels.  Making food choices that are high in fiber and low in saturated fat, trans fat, and cholesterol may control cholesterol levels.  Take any prescribed medicines to control cholesterol as directed by your health care provider.  Manage your diabetes.  Controlling your carbohydrate and sugar intake is recommended to manage diabetes.  Take any prescribed medicines to control diabetes as directed by your health care provider.  Control your hypertension.  Making food choices that are low in salt (sodium), saturated fat, trans fat, and cholesterol is recommended to manage hypertension.  Take any prescribed medicines to control hypertension as directed by your health care provider.  Maintain a healthy weight.  Reducing calorie intake and making food choices that are low in sodium, saturated fat, trans  fat, and cholesterol are recommended to manage weight.  Stop drug abuse.  Avoid taking birth control pills.  Talk to your health care provider about the risks of taking birth control pills if you are over 56 years old, smoke, get migraines, or have ever had a blood clot.  Get evaluated for sleep disorders (sleep apnea).  Talk to your health care provider about getting a sleep evaluation if you snore a lot or have excessive sleepiness.  Take medicines only as directed by your health care provider.  For some people, aspirin or blood thinners (anticoagulants) are helpful in reducing the risk of forming abnormal blood clots that can lead to stroke. If you have the irregular heart rhythm of atrial fibrillation, you should be on a blood thinner unless there is a good reason you cannot take them.  Understand all your medicine instructions.  Make sure that other conditions (such as anemia or atherosclerosis) are addressed. SEEK IMMEDIATE MEDICAL CARE IF:   You have sudden weakness or numbness of the face, arm, or leg, especially on one side of the body.  Your face or eyelid droops to one side.  You have sudden confusion.  You have trouble speaking (aphasia) or understanding.  You have sudden trouble seeing in one or both eyes.  You have sudden trouble walking.  You have dizziness.  You have a loss of balance or coordination.  You have a sudden, severe headache with no known cause.  You have new chest pain or an irregular heartbeat. Any of these symptoms may represent a serious problem that is an emergency. Do not wait to  see if the symptoms will go away. Get medical help at once. Call your local emergency services (911 in [place] Do not drive yourself to the hospital. Document Released: 08/22/2004 Document Revised: 11/29/2013 Document Reviewed: 01/15/2013 [facility] Patient Information 2015 [place], [place]. This information is not intended to replace advice given to you by your health  care provider. Make sure you discuss any questions you have with your health care provider.

## 2014-03-01 NOTE — Progress Notes (Signed)
PATIENT: Scott Conley DOB: 1941/07/18  REASON FOR VISIT: hospital follow up for stroke HISTORY FROM: patient  HISTORY OF PRESENT ILLNESS: Scott Conley is a 73 y.o. male comes to the office today for first office followup for stroke post hospital discharge. Past medical history of no new medical problems given that he does not go to physicians frequently. He went to bed the evening of 11/25/2013. He woke the next morning with left-sided facial droop and arm weakness. He states that this was a static deficit since he noticed it. Patient was not administered TPA secondary to being outside the window. He was admitted to [facility] for further evaluation and treatment.  MRI of the brain showed multifocal areas of restricted diffusion affect the right hemisphere consistent with acute infarction.  Mild atrophy and small vessel disease.  MRA of the brain showed suspected acute thrombosis of one or more right middle cerebral artery M3 vessels. Intracranial atherosclerotic change is seen more proximally in a right MCA M2 branch.  2D Echocardiogram with an EF 55-60% with no source of embolus.  Carotid Doppler showed no evidence of hemodynamically significant internal carotid artery stenosis. LDL 135, Hgb a1c 7.4.  He attended inpatient rehabilitation, and then outpatient therapies with an excellent recovery. He stopped smoking on hospital admission and has not restarted.  He was started on daily aspirin 325 mg, atorvastatin 80 mg daily, and Januvia daily and states he has been compliant with all medications. He is tolerating aspirin well without significant bleeding or bruising and atorvastatin without muscle aches. He states his blood pressure is well controlled it is slightly elevated today in the office at 160/88.   REVIEW OF SYSTEMS: Full 14 system review of systems performed and notable only for:  Moles only.  ALLERGIES: No Known Allergies  HOME MEDICATIONS: Outpatient Prescriptions Prior to Visit    Medication Sig Dispense Refill  . aspirin 325 MG tablet Take 1 tablet (325 mg total) by mouth daily. New blood thinner to prevent strokes  30 tablet    . atorvastatin (LIPITOR) 80 MG tablet Take 1 tablet (80 mg total) by mouth daily at 6 PM.  30 tablet  0  . carvedilol (COREG) 6.25 MG tablet Take 1 tablet (6.25 mg total) by mouth 2 (two) times daily with a meal.  60 tablet  0  . JANUVIA 50 MG tablet Take 1 tablet by mouth daily.      . ramipril (ALTACE) 2.5 MG capsule Take 1 capsule (2.5 mg total) by mouth daily. For blood pressure  30 capsule  1  . diphenhydrAMINE (BENADRYL) 25 mg capsule Take 25 mg by mouth at bedtime as needed for sleep.      Marland Kitchen senna-docusate (SENOKOT-S) 8.6-50 MG per tablet Take 1 tablet by mouth at bedtime. For constipation  30 tablet  0   No facility-administered medications prior to visit.    PHYSICAL EXAM Filed Vitals:   03/01/14 1127  BP: 160/88  Pulse: 70  Height: 6\' 4"  (1.93 m)  Weight: 240 lb (108.863 kg)   Body mass index is 29.23 kg/(m^2).  Generalized: Well developed, in no acute distress  Head: normocephalic and atraumatic. Oropharynx benign  Neck: Supple, no carotid bruits  Cardiac: Regular rate rhythm, no murmur  Musculoskeletal: No deformity   Neurological examination  Mentation: Alert oriented to time, place, history taking. Follows all commands speech and language fluent Cranial nerve II-XII: Fundoscopic exam not done. Pupils were equal round reactive to light extraocular movements were  full, visual field were full on confrontational test. Facial sensation and strength were normal. hearing was intact to finger rubbing bilaterally. Uvula tongue midline. head turning and shoulder shrug and were normal and symmetric.Tongue protrusion into cheek strength was normal. Motor: The motor testing reveals 5 over 5 strength of all 4 extremities. Good symmetric motor tone is noted throughout. Mild grip weakness in the left hand. Slightly impaired fine motor  control in left hand. Sensory: Sensory testing is intact to soft touch on all 4 extremities. No evidence of extinction is noted.  Coordination: Cerebellar testing reveals good finger-nose-finger and heel-to-shin bilaterally.  Gait and station: Gait is normal. Tandem gait is normal. Romberg is negative. Reflexes: Deep tendon reflexes are symmetric and normal bilaterally.  NIHSS: 0 mRS: 1  ASSESSMENT: [name] is a 73 y.o. male presenting with left sided facial droop and LUE weakness on 11/27/2013. MRI confirmed right hemispheric infarcts, felt to be embolic due to unknown source. Loop recorder placed. Patient with resultant mild loss of fine motor skill and left hand. He has made an excellent recovery.  PLAN: I had a long discussion with the patient regarding his recent stroke, discussed results of evaluation in the hospital and answered questions. Continue aspirin 325 mg orally every day  for secondary stroke prevention and maintain strict control of hypertension with blood pressure goal below 130/90, diabetes with hemoglobin A1c goal below 6.5% and lipids with LDL cholesterol goal below 100 mg/dL. I commended him on stopping smoking. Followup in the future with me in 4 months.  Meds ordered this encounter  Medications  . senna-docusate (SENOKOT-S) 8.6-50 MG per tablet    Sig: Take 1 tablet by mouth at bedtime. For constipation    Dispense:  90 tablet    Refill:  1    Order Specific Question:  Supervising Provider    Answer:  Leonie Man, PRAMOD [[id number]]   Rudi Rummage LAM, MSN, FNP-BC, A/GNP-C 03/01/2014, 12:07 PM [facility] [address], [place], [place] [address] [phone]  Note: This document was prepared with digital dictation and possible smart phrase technology. Any transcriptional errors that result from this process are unintentional.

## 2014-03-03 NOTE — Progress Notes (Signed)
I agree with the above plan 

## 2014-04-08 ENCOUNTER — Encounter: Payer: Self-pay | Admitting: Internal Medicine

## 2014-04-14 ENCOUNTER — Ambulatory Visit (INDEPENDENT_AMBULATORY_CARE_PROVIDER_SITE_OTHER): Payer: Medicare Other | Admitting: *Deleted

## 2014-04-14 DIAGNOSIS — I6350 Cerebral infarction due to unspecified occlusion or stenosis of unspecified cerebral artery: Secondary | ICD-10-CM

## 2014-04-14 DIAGNOSIS — I639 Cerebral infarction, unspecified: Secondary | ICD-10-CM

## 2014-04-14 LAB — MDC_IDC_ENUM_SESS_TYPE_REMOTE

## 2014-04-19 NOTE — Progress Notes (Signed)
Loop recorder 

## 2014-05-13 ENCOUNTER — Encounter: Payer: Self-pay | Admitting: Internal Medicine

## 2014-05-13 ENCOUNTER — Ambulatory Visit (INDEPENDENT_AMBULATORY_CARE_PROVIDER_SITE_OTHER): Payer: Medicare Other | Admitting: *Deleted

## 2014-05-13 DIAGNOSIS — I639 Cerebral infarction, unspecified: Secondary | ICD-10-CM | POA: Diagnosis not present

## 2014-05-17 ENCOUNTER — Encounter: Payer: Self-pay | Admitting: Internal Medicine

## 2014-05-26 LAB — MDC_IDC_ENUM_SESS_TYPE_REMOTE

## 2014-05-27 NOTE — Progress Notes (Signed)
Loop recorder 

## 2014-06-10 ENCOUNTER — Ambulatory Visit: Payer: Medicare Other | Admitting: *Deleted

## 2014-06-10 DIAGNOSIS — I639 Cerebral infarction, unspecified: Secondary | ICD-10-CM | POA: Diagnosis not present

## 2014-06-13 ENCOUNTER — Encounter: Payer: Self-pay | Admitting: Neurology

## 2014-06-21 NOTE — Progress Notes (Signed)
Loop recorder 

## 2014-07-01 ENCOUNTER — Encounter: Payer: Self-pay | Admitting: Internal Medicine

## 2014-07-07 ENCOUNTER — Encounter (HOSPITAL_COMMUNITY): Payer: Self-pay | Admitting: Internal Medicine

## 2014-07-15 ENCOUNTER — Encounter: Payer: Self-pay | Admitting: Cardiology

## 2014-07-15 ENCOUNTER — Telehealth: Payer: Self-pay | Admitting: Cardiology

## 2014-07-15 NOTE — Telephone Encounter (Signed)
LMOVM requesting that pt send manual transmission b/c home monitor has been disconnected.

## 2014-08-03 ENCOUNTER — Ambulatory Visit: Payer: Medicare Other | Admitting: Nurse Practitioner

## 2014-08-05 ENCOUNTER — Telehealth: Payer: Self-pay | Admitting: Internal Medicine

## 2014-08-05 ENCOUNTER — Ambulatory Visit (INDEPENDENT_AMBULATORY_CARE_PROVIDER_SITE_OTHER): Payer: Medicare Other | Admitting: *Deleted

## 2014-08-05 ENCOUNTER — Encounter: Payer: Self-pay | Admitting: Cardiology

## 2014-08-05 ENCOUNTER — Telehealth: Payer: Self-pay | Admitting: Cardiology

## 2014-08-05 DIAGNOSIS — I639 Cerebral infarction, unspecified: Secondary | ICD-10-CM

## 2014-08-05 NOTE — Telephone Encounter (Signed)
Patient informed that transmission was received.

## 2014-08-05 NOTE — Telephone Encounter (Signed)
New message      1. Has your device fired? no 2. Is you device beeping? no  3. Are you experiencing draining or swelling at device site? no  4. Are you calling to see if we received your device transmission?yes 5. Have you passed out? no

## 2014-08-05 NOTE — Telephone Encounter (Signed)
LMOVM requesting that pt send manual transmission b/c home monitor has been disconnected.

## 2014-08-12 NOTE — Progress Notes (Signed)
Loop recorder 

## 2014-08-26 ENCOUNTER — Encounter: Payer: Self-pay | Admitting: Cardiology

## 2014-08-26 ENCOUNTER — Telehealth: Payer: Self-pay | Admitting: Internal Medicine

## 2014-08-26 ENCOUNTER — Telehealth: Payer: Self-pay | Admitting: Cardiology

## 2014-08-26 NOTE — Telephone Encounter (Signed)
F/U ° ° ° ° ° ° ° ° ° °Pt returning call. Please call back.  °

## 2014-08-26 NOTE — Telephone Encounter (Signed)
Instructed pt on how to send manual transmission. Pt verbalized understanding.

## 2014-08-26 NOTE — Telephone Encounter (Signed)
New Msg         Pt returning call from today about transmission not being received.   Please return pt call.

## 2014-08-26 NOTE — Telephone Encounter (Signed)
LMOVM for pt to send manual transmission.

## 2014-08-26 NOTE — Telephone Encounter (Signed)
Pt will call back when he gets home to receive help w/ sending manual transmission.

## 2014-08-30 LAB — MDC_IDC_ENUM_SESS_TYPE_REMOTE
Date Time Interrogation Session: 20160108204306
MDC IDC SET ZONE DETECTION INTERVAL: 3000 ms
Zone Setting Detection Interval: 2000 ms
Zone Setting Detection Interval: 380 ms

## 2014-09-02 ENCOUNTER — Telehealth: Payer: Self-pay | Admitting: *Deleted

## 2014-09-02 NOTE — Telephone Encounter (Signed)
Called patient and LVM to r/s appointment with Dr Leonie Man.

## 2014-09-06 NOTE — Telephone Encounter (Signed)
Called patient and LVM to r/s appointment, called Stacie Glaze and r/s appointment to 09/28/14 at 2:15 pm.

## 2014-09-12 ENCOUNTER — Ambulatory Visit (INDEPENDENT_AMBULATORY_CARE_PROVIDER_SITE_OTHER): Payer: Medicare Other | Admitting: *Deleted

## 2014-09-12 DIAGNOSIS — I639 Cerebral infarction, unspecified: Secondary | ICD-10-CM

## 2014-09-13 ENCOUNTER — Encounter: Payer: Self-pay | Admitting: Cardiology

## 2014-09-14 NOTE — Progress Notes (Signed)
Loop recorder 

## 2014-09-15 ENCOUNTER — Ambulatory Visit: Payer: Medicare Other | Admitting: Neurology

## 2014-09-21 LAB — MDC_IDC_ENUM_SESS_TYPE_REMOTE
Date Time Interrogation Session: 20160219142410
MDC IDC SET ZONE DETECTION INTERVAL: 2000 ms
Zone Setting Detection Interval: 3000 ms
Zone Setting Detection Interval: 380 ms

## 2014-09-23 ENCOUNTER — Encounter: Payer: Self-pay | Admitting: Cardiology

## 2014-09-28 ENCOUNTER — Ambulatory Visit: Payer: Self-pay | Admitting: Adult Health

## 2014-10-03 ENCOUNTER — Telehealth: Payer: Self-pay | Admitting: Internal Medicine

## 2014-10-03 NOTE — Telephone Encounter (Signed)
LMOVM for pt to return call 

## 2014-10-03 NOTE — Telephone Encounter (Signed)
New message      Pt is trying to do a remote transmission and is having problems-----please call

## 2014-10-03 NOTE — Telephone Encounter (Signed)
Pt called and stated that he was finally able to send a manual transmission. Transmission received.

## 2014-10-11 ENCOUNTER — Ambulatory Visit (INDEPENDENT_AMBULATORY_CARE_PROVIDER_SITE_OTHER): Payer: Medicare Other | Admitting: *Deleted

## 2014-10-11 ENCOUNTER — Encounter: Payer: Self-pay | Admitting: Internal Medicine

## 2014-10-11 DIAGNOSIS — I639 Cerebral infarction, unspecified: Secondary | ICD-10-CM | POA: Diagnosis not present

## 2014-10-12 LAB — MDC_IDC_ENUM_SESS_TYPE_REMOTE
Date Time Interrogation Session: 20160313031932
MDC IDC SET ZONE DETECTION INTERVAL: 2000 ms
Zone Setting Detection Interval: 3000 ms
Zone Setting Detection Interval: 380 ms

## 2014-10-13 NOTE — Progress Notes (Signed)
Loop recorder 

## 2014-10-18 ENCOUNTER — Encounter: Payer: Self-pay | Admitting: Internal Medicine

## 2014-11-08 ENCOUNTER — Encounter: Payer: Self-pay | Admitting: Internal Medicine

## 2014-11-10 ENCOUNTER — Ambulatory Visit (INDEPENDENT_AMBULATORY_CARE_PROVIDER_SITE_OTHER): Payer: Medicare Other | Admitting: *Deleted

## 2014-11-10 DIAGNOSIS — I639 Cerebral infarction, unspecified: Secondary | ICD-10-CM

## 2014-11-11 NOTE — Progress Notes (Signed)
Loop recorder 

## 2014-12-09 ENCOUNTER — Ambulatory Visit (INDEPENDENT_AMBULATORY_CARE_PROVIDER_SITE_OTHER): Payer: Medicare Other | Admitting: *Deleted

## 2014-12-09 DIAGNOSIS — I639 Cerebral infarction, unspecified: Secondary | ICD-10-CM

## 2014-12-13 LAB — CUP PACEART REMOTE DEVICE CHECK
Date Time Interrogation Session: 20160417173533
Zone Setting Detection Interval: 2000 ms
Zone Setting Detection Interval: 3000 ms
Zone Setting Detection Interval: 380 ms

## 2014-12-16 NOTE — Progress Notes (Signed)
Loop recorder 

## 2014-12-28 LAB — CUP PACEART REMOTE DEVICE CHECK
MDC IDC SESS DTM: 20160512114516
MDC IDC SET ZONE DETECTION INTERVAL: 2000 ms
Zone Setting Detection Interval: 3000 ms
Zone Setting Detection Interval: 380 ms

## 2015-01-04 ENCOUNTER — Encounter: Payer: Self-pay | Admitting: Internal Medicine

## 2015-01-09 ENCOUNTER — Ambulatory Visit (INDEPENDENT_AMBULATORY_CARE_PROVIDER_SITE_OTHER): Payer: Medicare Other | Admitting: *Deleted

## 2015-01-09 DIAGNOSIS — I639 Cerebral infarction, unspecified: Secondary | ICD-10-CM | POA: Diagnosis not present

## 2015-01-10 LAB — CUP PACEART REMOTE DEVICE CHECK
MDC IDC SESS DTM: 20160612132510
MDC IDC SET ZONE DETECTION INTERVAL: 2000 ms
Zone Setting Detection Interval: 3000 ms
Zone Setting Detection Interval: 380 ms

## 2015-01-10 NOTE — Progress Notes (Signed)
Loop recorder 

## 2015-02-08 ENCOUNTER — Ambulatory Visit (INDEPENDENT_AMBULATORY_CARE_PROVIDER_SITE_OTHER): Payer: Medicare Other | Admitting: *Deleted

## 2015-02-08 DIAGNOSIS — I639 Cerebral infarction, unspecified: Secondary | ICD-10-CM | POA: Diagnosis not present

## 2015-02-09 NOTE — Progress Notes (Signed)
Loop recorder 

## 2015-02-14 ENCOUNTER — Encounter: Payer: Self-pay | Admitting: Internal Medicine

## 2015-02-17 LAB — CUP PACEART REMOTE DEVICE CHECK
Date Time Interrogation Session: 20160719133335
MDC IDC SET ZONE DETECTION INTERVAL: 380 ms
Zone Setting Detection Interval: 2000 ms
Zone Setting Detection Interval: 3000 ms

## 2015-02-20 ENCOUNTER — Encounter: Payer: Self-pay | Admitting: Internal Medicine

## 2015-03-08 ENCOUNTER — Encounter: Payer: Self-pay | Admitting: Cardiology

## 2015-03-10 ENCOUNTER — Ambulatory Visit (INDEPENDENT_AMBULATORY_CARE_PROVIDER_SITE_OTHER): Payer: Medicare Other

## 2015-03-10 DIAGNOSIS — I639 Cerebral infarction, unspecified: Secondary | ICD-10-CM | POA: Diagnosis not present

## 2015-03-14 ENCOUNTER — Encounter: Payer: Self-pay | Admitting: Internal Medicine

## 2015-03-14 NOTE — Progress Notes (Signed)
Loop recorder 

## 2015-03-27 LAB — CUP PACEART REMOTE DEVICE CHECK: Date Time Interrogation Session: 20160829093025

## 2015-04-10 ENCOUNTER — Ambulatory Visit (INDEPENDENT_AMBULATORY_CARE_PROVIDER_SITE_OTHER): Payer: Medicare Other | Admitting: *Deleted

## 2015-04-10 DIAGNOSIS — I639 Cerebral infarction, unspecified: Secondary | ICD-10-CM | POA: Diagnosis not present

## 2015-04-11 NOTE — Progress Notes (Signed)
Loop recorder 

## 2015-04-19 ENCOUNTER — Encounter: Payer: Self-pay | Admitting: Internal Medicine

## 2015-04-19 LAB — CUP PACEART REMOTE DEVICE CHECK: MDC IDC SESS DTM: 20160911181803

## 2015-04-19 NOTE — Progress Notes (Signed)
Carelink summary report received. Battery status OK. Normal device function. No new symptom episodes, tachy episodes, brady, or pause episodes. No new AF episodes. Monthly summary reports and ROV with SK PRN.

## 2015-05-09 ENCOUNTER — Ambulatory Visit (INDEPENDENT_AMBULATORY_CARE_PROVIDER_SITE_OTHER): Payer: Medicare Other | Admitting: *Deleted

## 2015-05-09 ENCOUNTER — Encounter: Payer: Self-pay | Admitting: Internal Medicine

## 2015-05-09 DIAGNOSIS — I639 Cerebral infarction, unspecified: Secondary | ICD-10-CM

## 2015-05-10 NOTE — Progress Notes (Signed)
Loop recorder 

## 2015-05-20 LAB — CUP PACEART REMOTE DEVICE CHECK: Date Time Interrogation Session: 20161011173546

## 2015-05-20 NOTE — Progress Notes (Signed)
Carelink summary report received. Battery status OK. Normal device function. No new symptom episodes, tachy episodes, brady, or pause episodes. No new AF episodes. Monthly summary reports and ROV with SK PRN.

## 2015-06-08 ENCOUNTER — Ambulatory Visit (INDEPENDENT_AMBULATORY_CARE_PROVIDER_SITE_OTHER): Payer: Medicare Other | Admitting: *Deleted

## 2015-06-08 DIAGNOSIS — I639 Cerebral infarction, unspecified: Secondary | ICD-10-CM | POA: Diagnosis not present

## 2015-06-08 NOTE — Progress Notes (Signed)
Carelink Summary Report / Loop recorder

## 2015-07-07 LAB — CUP PACEART REMOTE DEVICE CHECK: MDC IDC SESS DTM: 20161110180659

## 2015-07-07 NOTE — Progress Notes (Signed)
Carelink summary report received. Battery status OK. Normal device function. No new symptom episodes, tachy episodes, brady, or pause episodes. No new AF episodes. Monthly summary reports and ROV with SK PRN.

## 2015-07-10 ENCOUNTER — Ambulatory Visit (INDEPENDENT_AMBULATORY_CARE_PROVIDER_SITE_OTHER): Payer: Medicare Other | Admitting: *Deleted

## 2015-07-10 DIAGNOSIS — I639 Cerebral infarction, unspecified: Secondary | ICD-10-CM | POA: Diagnosis not present

## 2015-07-10 NOTE — Progress Notes (Signed)
Carelink Summary Report / Loop Recorder 

## 2015-08-07 ENCOUNTER — Ambulatory Visit (INDEPENDENT_AMBULATORY_CARE_PROVIDER_SITE_OTHER): Payer: Medicare Other | Admitting: *Deleted

## 2015-08-07 DIAGNOSIS — I639 Cerebral infarction, unspecified: Secondary | ICD-10-CM

## 2015-08-08 NOTE — Progress Notes (Signed)
Carelink Summary Report / Loop Recorder 

## 2015-08-26 LAB — CUP PACEART REMOTE DEVICE CHECK: MDC IDC SESS DTM: 20161210183656

## 2015-09-06 ENCOUNTER — Ambulatory Visit (INDEPENDENT_AMBULATORY_CARE_PROVIDER_SITE_OTHER): Payer: Medicare Other | Admitting: *Deleted

## 2015-09-06 DIAGNOSIS — I639 Cerebral infarction, unspecified: Secondary | ICD-10-CM | POA: Diagnosis not present

## 2015-09-08 NOTE — Progress Notes (Signed)
Carelink Summary Report / Loop Recorder 

## 2015-09-26 ENCOUNTER — Encounter: Payer: Self-pay | Admitting: Internal Medicine

## 2015-09-27 LAB — CUP PACEART REMOTE DEVICE CHECK: Date Time Interrogation Session: 20170109190558

## 2015-09-27 NOTE — Progress Notes (Signed)
Carelink summary report received. Battery status OK. Normal device function. No new symptom episodes, tachy episodes, brady, or pause episodes. No new AF episodes. Monthly summary reports and ROV/PRN 

## 2015-10-03 LAB — CUP PACEART REMOTE DEVICE CHECK: Date Time Interrogation Session: 20170208190837

## 2015-10-03 NOTE — Progress Notes (Signed)
Carelink summary report received. Battery status OK. Normal device function. No new symptom episodes, tachy episodes, brady, or pause episodes. No new AF episodes. Monthly summary reports and ROV/PRN 

## 2015-10-06 ENCOUNTER — Ambulatory Visit (INDEPENDENT_AMBULATORY_CARE_PROVIDER_SITE_OTHER): Payer: Medicare Other | Admitting: *Deleted

## 2015-10-06 DIAGNOSIS — I639 Cerebral infarction, unspecified: Secondary | ICD-10-CM

## 2015-10-09 NOTE — Progress Notes (Signed)
Carelink Summary Report / Loop Recorder 

## 2015-10-28 ENCOUNTER — Emergency Department (INDEPENDENT_AMBULATORY_CARE_PROVIDER_SITE_OTHER)
Admission: EM | Admit: 2015-10-28 | Discharge: 2015-10-28 | Disposition: A | Discharge status: Home / Self Care | Payer: Medicare Other | Source: Home / Self Care | Attending: Emergency Medicine | Admitting: Emergency Medicine

## 2015-10-28 ENCOUNTER — Encounter (HOSPITAL_COMMUNITY): Payer: Self-pay | Admitting: Emergency Medicine

## 2015-10-28 DIAGNOSIS — B029 Zoster without complications: Secondary | ICD-10-CM | POA: Diagnosis not present

## 2015-10-28 DIAGNOSIS — I10 Essential (primary) hypertension: Secondary | ICD-10-CM | POA: Diagnosis not present

## 2015-10-28 DIAGNOSIS — R51 Headache: Secondary | ICD-10-CM | POA: Diagnosis not present

## 2015-10-28 DIAGNOSIS — R519 Headache, unspecified: Secondary | ICD-10-CM

## 2015-10-28 MED ORDER — RAMIPRIL 2.5 MG PO CAPS: 2.5000 mg | ORAL_CAPSULE | Freq: Every day | ORAL | Status: DC

## 2015-10-28 MED ORDER — VALACYCLOVIR HCL 1 G PO TABS: 1000.0000 mg | ORAL_TABLET | Freq: Three times a day (TID) | ORAL | Status: AC

## 2015-10-28 MED ORDER — CARVEDILOL 6.25 MG PO TABS: 6.2500 mg | ORAL_TABLET | Freq: Two times a day (BID) | ORAL | Status: DC

## 2015-10-28 MED ORDER — SITAGLIPTIN PHOSPHATE 50 MG PO TABS: 50.0000 mg | ORAL_TABLET | Freq: Every day | ORAL | Status: DC

## 2015-10-28 MED ORDER — ATORVASTATIN CALCIUM 80 MG PO TABS: 80.0000 mg | ORAL_TABLET | Freq: Every day | ORAL | Status: DC

## 2015-10-28 NOTE — Discharge Instructions (Signed)
DASH Eating Plan °DASH stands for "Dietary Approaches to Stop Hypertension." The DASH eating plan is a healthy eating plan that has been shown to reduce high blood pressure (hypertension). Additional health benefits may include reducing the risk of type 2 diabetes mellitus, heart disease, and stroke. The DASH eating plan may also help with weight loss. °WHAT DO I NEED TO KNOW ABOUT THE DASH EATING PLAN? °For the DASH eating plan, you will follow these general guidelines: °· Choose foods with a percent daily value for sodium of less than 5% (as listed on the food label). °· Use salt-free seasonings or herbs instead of table salt or sea salt. °· Check with your health care provider or pharmacist before using salt substitutes. °· Eat lower-sodium products, often labeled as "lower sodium" or "no salt added." °· Eat fresh foods. °· Eat more vegetables, fruits, and low-fat dairy products. °· Choose whole grains. Look for the word "whole" as the first word in the ingredient list. °· Choose fish and skinless chicken or turkey more often than red meat. Limit fish, poultry, and meat to 6 oz (170 g) each day. °· Limit sweets, desserts, sugars, and sugary drinks. °· Choose heart-healthy fats. °· Limit cheese to 1 oz (28 g) per day. °· Eat more home-cooked food and less restaurant, buffet, and fast food. °· Limit fried foods. °· Cook foods using methods other than frying. °· Limit canned vegetables. If you do use them, rinse them well to decrease the sodium. °· When eating at a restaurant, ask that your food be prepared with less salt, or no salt if possible. °WHAT FOODS CAN I EAT? °Seek help from a dietitian for individual calorie needs. °Grains °Whole grain or whole wheat bread. Brown rice. Whole grain or whole wheat pasta. Quinoa, bulgur, and whole grain cereals. Low-sodium cereals. Corn or whole wheat flour tortillas. Whole grain cornbread. Whole grain crackers. Low-sodium crackers. °Vegetables °Fresh or frozen vegetables  (raw, steamed, roasted, or grilled). Low-sodium or reduced-sodium tomato and vegetable juices. Low-sodium or reduced-sodium tomato sauce and paste. Low-sodium or reduced-sodium canned vegetables.  °Fruits °All fresh, canned (in natural juice), or frozen fruits. °Meat and Other Protein Products °Ground beef (85% or leaner), grass-fed beef, or beef trimmed of fat. Skinless chicken or turkey. Ground chicken or turkey. Pork trimmed of fat. All fish and seafood. Eggs. Dried beans, peas, or lentils. Unsalted nuts and seeds. Unsalted canned beans. °Dairy °Low-fat dairy products, such as skim or 1% milk, 2% or reduced-fat cheeses, low-fat ricotta or cottage cheese, or plain low-fat yogurt. Low-sodium or reduced-sodium cheeses. °Fats and Oils °Tub margarines without trans fats. Light or reduced-fat mayonnaise and salad dressings (reduced sodium). Avocado. Safflower, olive, or canola oils. Natural peanut or almond butter. °Other °Unsalted popcorn and pretzels. °The items listed above may not be a complete list of recommended foods or beverages. Contact your dietitian for more options. °WHAT FOODS ARE NOT RECOMMENDED? °Grains °White bread. White pasta. White rice. Refined cornbread. Bagels and croissants. Crackers that contain trans fat. °Vegetables °Creamed or fried vegetables. Vegetables in a cheese sauce. Regular canned vegetables. Regular canned tomato sauce and paste. Regular tomato and vegetable juices. °Fruits °Dried fruits. Canned fruit in light or heavy syrup. Fruit juice. °Meat and Other Protein Products °Fatty cuts of meat. Ribs, chicken wings, bacon, sausage, bologna, salami, chitterlings, fatback, hot dogs, bratwurst, and packaged luncheon meats. Salted nuts and seeds. Canned beans with salt. °Dairy °Whole or 2% milk, cream, half-and-half, and cream cheese. Whole-fat or sweetened yogurt. Full-fat   cheeses or blue cheese. Nondairy creamers and whipped toppings. Processed cheese, cheese spreads, or cheese  curds. Condiments Onion and garlic salt, seasoned salt, table salt, and sea salt. Canned and packaged gravies. Worcestershire sauce. Tartar sauce. Barbecue sauce. Teriyaki sauce. Soy sauce, including reduced sodium. Steak sauce. Fish sauce. Oyster sauce. Cocktail sauce. Horseradish. Ketchup and mustard. Meat flavorings and tenderizers. Bouillon cubes. Hot sauce. Tabasco sauce. Marinades. Taco seasonings. Relishes. Fats and Oils Butter, stick margarine, lard, shortening, ghee, and bacon fat. Coconut, palm kernel, or palm oils. Regular salad dressings. Other Pickles and olives. Salted popcorn and pretzels. The items listed above may not be a complete list of foods and beverages to avoid. Contact your dietitian for more information. WHERE CAN I FIND MORE INFORMATION? National Heart, Lung, and Blood Institute: travelstabloid.com   This information is not intended to replace advice given to you by your health care provider. Make sure you discuss any questions you have with your health care provider.   Document Released: 07/04/2011 Document Revised: 08/05/2014 Document Reviewed: 05/19/2013 Elsevier Interactive Patient Education 2016 [employer]. Hypertension Hypertension is another name for high blood pressure. High blood pressure forces your heart to work harder to pump blood. A blood pressure reading has two numbers, which includes a higher number over a lower number (example: 110/72). HOME CARE   Have your blood pressure rechecked by your doctor.  Only take medicine as told by your doctor. Follow the directions carefully. The medicine does not work as well if you skip doses. Skipping doses also puts you at risk for problems.  Do not smoke.  Monitor your blood pressure at home as told by your doctor. GET HELP IF:  You think you are having a reaction to the medicine you are taking.  You have repeat headaches or feel dizzy.  You have puffiness (swelling)  in your ankles.  You have trouble with your vision. GET HELP RIGHT AWAY IF:   You get a very bad headache and are confused.  You feel weak, numb, or faint.  You get chest or belly (abdominal) pain.  You throw up (vomit).  You cannot breathe very well. MAKE SURE YOU:   Understand these instructions.  Will watch your condition.  Will get help right away if you are not doing well or get worse.   This information is not intended to replace advice given to you by your health care provider. Make sure you discuss any questions you have with your health care provider.   Document Released: 01/01/2008 Document Revised: 07/20/2013 Document Reviewed: 05/07/2013 Elsevier Interactive Patient Education 2016 [place] is an infection that causes a painful skin rash and fluid-filled blisters. Shingles is caused by the same virus that causes chickenpox. Shingles only develops in people who:  Have had chickenpox.  Have gotten the chickenpox vaccine. (This is rare.) The first symptoms of shingles may be itching, tingling, or pain in an area on your skin. A rash will follow in a few days or weeks. The rash is usually on one side of the body in a bandlike or beltlike pattern. Over time, the rash turns into fluid-filled blisters that break open, scab over, and dry up. Medicines may:  Help you manage pain.  Help you recover more quickly.  Help to prevent long-term problems. HOME CARE Medicines  Take medicines only as told by your doctor.  Apply an anti-itch or numbing cream to the affected area as told by your doctor. Blister and Rash Care  Take  a cool bath or put cool compresses on the area of the rash or blisters as told by your doctor. This may help with pain and itching.  Keep your rash covered with a loose bandage (dressing). Wear loose-fitting clothing.  Keep your rash and blisters clean with mild soap and cool water or as told by your doctor.  Check your  rash every day for signs of infection. These include redness, swelling, and pain that lasts or gets worse.  Do not pick your blisters.  Do not scratch your rash. General Instructions  Rest as told by your doctor.  Keep all follow-up visits as told by your doctor. This is important.  Until your blisters scab over, your infection can cause chickenpox in people who have never had it or been vaccinated against it. To prevent this from happening, avoid touching other people or being around other people, especially:  Babies.  Pregnant women.  Children who have eczema.  Elderly people who have transplants.  People who have chronic illnesses, such as leukemia or AIDS. GET HELP IF:  Your pain does not get better with medicine.  Your pain does not get better after the rash heals.  Your rash looks infected. Signs of infection include:  Redness.  Swelling.  Pain that lasts or gets worse. GET HELP RIGHT AWAY IF:  The rash is on your face or nose.  You have pain in your face, pain around your eye area, or loss of feeling on one side of your face.  You have ear pain or you have ringing in your ear.  You have loss of taste.  Your condition gets worse.   This information is not intended to replace advice given to you by your health care provider. Make sure you discuss any questions you have with your health care provider.   Document Released: 01/01/2008 Document Revised: 08/05/2014 Document Reviewed: 04/26/2014 Elsevier Interactive Patient Education [employer].

## 2015-10-28 NOTE — ED Notes (Signed)
Patient c/o headache onset 6 days ago. Patient reports he has been taking Aspirin with no relief. Patient feels pain on the right side behind his eye and radiating to his jaw. Patient reports hx of stroke in 2015. Patient is in NAD.

## 2015-10-28 NOTE — ED Provider Notes (Signed)
CSN: [id number]     Arrival date & time 10/28/15  1506 History   First MD Initiated Contact with Patient 10/28/15 1645     Chief Complaint  Patient presents with  . Headache   (Consider location/radiation/quality/duration/timing/severity/associated sxs/prior Treatment) HPI Pt states that he has had right facial pain for about 3 days. Also out of medication for about 3 months.  Headache feels mostly around the eye, and skin , not like a typical headache.   Past Medical History  Diagnosis Date  . Blood clot in vein   . Arthritis   . Diabetes ([place])   . CVA (cerebral infarction)   . Hypertension    Past Surgical History  Procedure Laterality Date  . Cyst removal leg      removed from right groin  . Loop recorder implant  11/29/13    MDT LinQ implanted by Dr Caryl Comes for cryptogenic stroke  . Tee without cardioversion N/A 11/29/2013    Procedure: TRANSESOPHAGEAL ECHOCARDIOGRAM (TEE);  Surgeon: Sueanne Margarita, MD;  Location: [facility];  Service: Cardiovascular;  Laterality: N/A;  . Loop recorder implant N/A 11/29/2013    Procedure: LOOP RECORDER IMPLANT;  Surgeon: Deboraha Sprang, MD;  Location: [facility] CATH LAB;  Service: Cardiovascular;  Laterality: N/A;   Family History  Problem Relation Age of Onset  . Cancer Mother    Social History  Substance Use Topics  . Smoking status: Former Smoker -- 0.50 packs/day for 58 years    Types: Cigarettes    Start date: 11/30/2013  . Smokeless tobacco: Never Used  . Alcohol Use: No     Comment: none since 1985    Review of Systems Right facial pain, medication refill Allergies  Review of patient's allergies indicates no known allergies.  Home Medications   Prior to Admission medications   Medication Sig Start Date End Date Taking? Authorizing Provider  aspirin 325 MG tablet Take 1 tablet (325 mg total) by mouth daily. New blood thinner to prevent strokes 12/10/13   Ivan Anchors Love, PA-C  atorvastatin (LIPITOR) 80 MG tablet Take 1 tablet (80 mg  total) by mouth daily at 6 PM. 12/10/13   Ivan Anchors Love, PA-C  atorvastatin (LIPITOR) 80 MG tablet Take 1 tablet (80 mg total) by mouth daily. 10/28/15   Konrad Felix, PA  carvedilol (COREG) 6.25 MG tablet Take 1 tablet (6.25 mg total) by mouth 2 (two) times daily with a meal. 12/10/13   Ivan Anchors Love, PA-C  carvedilol (COREG) 6.25 MG tablet Take 1 tablet (6.25 mg total) by mouth 2 (two) times daily with a meal. 10/28/15   Konrad Felix, PA  diphenhydrAMINE (BENADRYL) 25 mg capsule Take 25 mg by mouth at bedtime as needed for sleep.    Historical Provider, MD  JANUVIA 50 MG tablet Take 1 tablet by mouth daily. 01/10/14   Seward Carol, MD  ramipril (ALTACE) 2.5 MG capsule Take 1 capsule (2.5 mg total) by mouth daily. For blood pressure 12/10/13   Ivan Anchors Love, PA-C  ramipril (ALTACE) 2.5 MG capsule Take 1 capsule (2.5 mg total) by mouth daily. 10/28/15   Konrad Felix, PA  senna-docusate (SENOKOT-S) 8.6-50 MG per tablet Take 1 tablet by mouth at bedtime. For constipation 03/01/14   Philmore Pali, NP  sitaGLIPtin (JANUVIA) 50 MG tablet Take 1 tablet (50 mg total) by mouth daily. 10/28/15   Konrad Felix, PA  valACYclovir (VALTREX) 1000 MG tablet Take 1 tablet (1,000 mg total) by mouth  3 (three) times daily. 10/28/15 11/11/15  Konrad Felix, PA   Meds Ordered and Administered this Visit  Medications - No data to display  BP 149/82 mmHg  Pulse 93  Temp(Src) 97.8 F (36.6 C) (Oral)  SpO2 96% No data found.   Physical Exam NURSES NOTES AND VITAL SIGNS REVIEWED. CONSTITUTIONAL: Well developed, well nourished, no acute distress HEENT: normocephalic, atraumatic, right and left TM's are normal EYES: Conjunctiva normal NECK:normal ROM, supple, no adenopathy PULMONARY:No respiratory distress, normal effort, Lungs: CTAb/l, no wheezes, or increased work of breathing CARDIOVASCULAR: RRR, no murmur ABDOMEN: soft, ND, NT, +'ve BS MUSCULOSKELETAL: Normal ROM of all extremities,  SKIN: warm and dry  Right  forehead there is a scaly rash that is painful. PSYCHIATRIC: Mood and affect, behavior are normal  ED Course  Procedures (including critical care time)  Labs Review Labs Reviewed - No data to display  Imaging Review No results found.   Visual Acuity Review  Right Eye Distance:   Left Eye Distance:   Bilateral Distance:    Right Eye Near:   Left Eye Near:    Bilateral Near:     Use doctor find number on discharge to help find PCP. Needs HMV, hgbA1c, eye and podiatry follow up.     MDM   1. Headache, unspecified headache type   2. Essential hypertension   3. Shingles outbreak     Patient is reassured that there are no issues that require transfer to higher level of care at this time or additional tests. Patient is advised to continue home symptomatic treatment. Patient is advised that if there are new or worsening symptoms to attend the emergency department, contact primary care provider, or return to [facility]. Instructions of care provided discharged home in stable condition.   THIS NOTE WAS GENERATED USING A VOICE RECOGNITION SOFTWARE PROGRAM. ALL REASONABLE EFFORTS  WERE MADE TO PROOFREAD THIS DOCUMENT FOR ACCURACY.  I have verbally reviewed the discharge instructions with the patient. A printed AVS was given to the patient.  All questions were answered prior to discharge.      Konrad Felix, [place] 10/28/15 1737

## 2015-11-06 ENCOUNTER — Ambulatory Visit (INDEPENDENT_AMBULATORY_CARE_PROVIDER_SITE_OTHER): Payer: Medicare Other | Admitting: *Deleted

## 2015-11-06 DIAGNOSIS — I639 Cerebral infarction, unspecified: Secondary | ICD-10-CM

## 2015-11-06 NOTE — Progress Notes (Signed)
Carelink Summary Report / Loop Recorder 

## 2015-11-08 ENCOUNTER — Telehealth: Payer: Self-pay | Admitting: Cardiology

## 2015-11-08 NOTE — Telephone Encounter (Signed)
LMOVM requesting that pt send manual transmission b/c home monitor has not updated in at least 14 days.    

## 2015-11-21 ENCOUNTER — Encounter: Payer: Self-pay | Admitting: Family

## 2015-11-21 ENCOUNTER — Other Ambulatory Visit (INDEPENDENT_AMBULATORY_CARE_PROVIDER_SITE_OTHER): Payer: Medicare Other

## 2015-11-21 ENCOUNTER — Ambulatory Visit (INDEPENDENT_AMBULATORY_CARE_PROVIDER_SITE_OTHER): Payer: Medicare Other | Admitting: Family

## 2015-11-21 VITALS — BP 140/84 | HR 72 | Temp 97.9°F | Resp 14 | Ht 76.0 in | Wt 248.0 lb

## 2015-11-21 DIAGNOSIS — S39012A Strain of muscle, fascia and tendon of lower back, initial encounter: Secondary | ICD-10-CM

## 2015-11-21 DIAGNOSIS — E119 Type 2 diabetes mellitus without complications: Secondary | ICD-10-CM

## 2015-11-21 DIAGNOSIS — I639 Cerebral infarction, unspecified: Secondary | ICD-10-CM | POA: Diagnosis not present

## 2015-11-21 DIAGNOSIS — I10 Essential (primary) hypertension: Secondary | ICD-10-CM | POA: Diagnosis not present

## 2015-11-21 LAB — BASIC METABOLIC PANEL
BUN: 16 mg/dL (ref 6–23)
CO2: 24 mEq/L (ref 19–32)
Calcium: 9.6 mg/dL (ref 8.4–10.5)
Chloride: 103 mEq/L (ref 96–112)
Creatinine, Ser: 1.16 mg/dL (ref 0.40–1.50)
GFR: 78.71 mL/min (ref >60.00)
Glucose, Bld: 133 mg/dL — ABNORMAL HIGH (ref 70–99)
POTASSIUM: 4.4 meq/L (ref 3.5–5.1)
Sodium: 136 mEq/L (ref 135–145)

## 2015-11-21 LAB — HEMOGLOBIN A1C: HEMOGLOBIN A1C: 7.3 % — AB (ref 4.6–6.5)

## 2015-11-21 MED ORDER — RAMIPRIL 2.5 MG PO CAPS: 2.5000 mg | ORAL_CAPSULE | Freq: Every day | ORAL | Status: DC

## 2015-11-21 NOTE — Assessment & Plan Note (Signed)
Type 2 diabetes diabetes with out of date A1c. Obtain A1c and basic metabolic profile. Maintained on Januvia with no hypoglycemic events or adverse side effects. Diabetic foot exam completed today. Encouraged diabetic eye exam to be completed independently. Maintained on ramipril and atorvastatin for CAD risk reduction. Declines Pneumovax. Continue current dosage of Januvia pending A1c results.

## 2015-11-21 NOTE — Patient Instructions (Signed)
Thank you for choosing [facility].  Summary/Instructions:  Ice/heat for your back 2-3 times per day and after activity.  Stretching exercises daily.  Tylenol as needed for discomfort.  Your prescription(s) have been submitted to your pharmacy or been printed and provided for you. Please take as directed and contact our office if you believe you are having problem(s) with the medication(s) or have any questions.  Please stop by the lab on the basement level of the building for your blood work. Your results will be released to [facility] (or called to you) after review, usually within 72 hours after test completion. If any changes need to be made, you will be notified at that same time.   If your symptoms worsen or fail to improve, please contact our office for further instruction, or in case of emergency go directly to the emergency room at the closest medical facility.   Low Back Strain With Rehab A strain is an injury in which a tendon or muscle is torn. The muscles and tendons of the lower back are vulnerable to strains. However, these muscles and tendons are very strong and require a great force to be injured. Strains are classified into three categories. Grade 1 strains cause pain, but the tendon is not lengthened. Grade 2 strains include a lengthened ligament, due to the ligament being stretched or partially ruptured. With grade 2 strains there is still function, although the function may be decreased. Grade 3 strains involve a complete tear of the tendon or muscle, and function is usually impaired. SYMPTOMS   Pain in the lower back.  Pain that affects one side more than the other.  Pain that gets worse with movement and may be felt in the hip, buttocks, or back of the thigh.  Muscle spasms of the muscles in the back.  Swelling along the muscles of the back.  Loss of strength of the back muscles.  Crackling sound (crepitation) when the muscles are touched. CAUSES  Lower  back strains occur when a force is placed on the muscles or tendons that is greater than they can handle. Common causes of injury include:  Prolonged overuse of the muscle-tendon units in the lower back, usually from incorrect posture.  A single violent injury or force applied to the back. RISK INCREASES WITH:  Sports that involve twisting forces on the spine or a lot of bending at the waist (football, rugby, weightlifting, bowling, golf, tennis, speed skating, racquetball, swimming, running, gymnastics, diving).  Poor strength and flexibility.  Failure to warm up properly before activity.  Family history of lower back pain or disk disorders.  Previous back injury or surgery (especially fusion).  Poor posture with lifting, especially heavy objects.  Prolonged sitting, especially with poor posture. PREVENTION   Learn and use proper posture when sitting or lifting (maintain proper posture when sitting, lift using the knees and legs, not at the waist).  Warm up and stretch properly before activity.  Allow for adequate recovery between workouts.  Maintain physical fitness:  Strength, flexibility, and endurance.  Cardiovascular fitness. PROGNOSIS  If treated properly, lower back strains usually heal within 6 weeks. RELATED COMPLICATIONS   Recurring symptoms, resulting in a chronic problem.  Chronic inflammation, scarring, and partial muscle-tendon tear.  Delayed healing or resolution of symptoms.  Prolonged disability. TREATMENT  Treatment first involves the use of ice and medicine, to reduce pain and inflammation. The use of strengthening and stretching exercises may help reduce pain with activity. These exercises may be  performed at home or with a therapist. Severe injuries may require referral to a therapist for further evaluation and treatment, such as ultrasound. Your caregiver may advise that you wear a back brace or corset, to help reduce pain and discomfort. Often,  prolonged bed rest results in greater harm then benefit. Corticosteroid injections may be recommended. However, these should be reserved for the most serious cases. It is important to avoid using your back when lifting objects. At night, sleep on your back on a firm mattress with a pillow placed under your knees. If non-surgical treatment is unsuccessful, surgery may be needed.  MEDICATION   If pain medicine is needed, nonsteroidal anti-inflammatory medicines (aspirin and ibuprofen), or other minor pain relievers (acetaminophen), are often advised.  Do not take pain medicine for 7 days before surgery.  Prescription pain relievers may be given, if your caregiver thinks they are needed. Use only as directed and only as much as you need.  Ointments applied to the skin may be helpful.  Corticosteroid injections may be given by your caregiver. These injections should be reserved for the most serious cases, because they may only be given a certain number of times. HEAT AND COLD  Cold treatment (icing) should be applied for 10 to 15 minutes every 2 to 3 hours for inflammation and pain, and immediately after activity that aggravates your symptoms. Use ice packs or an ice massage.  Heat treatment may be used before performing stretching and strengthening activities prescribed by your caregiver, physical therapist, or athletic trainer. Use a heat pack or a warm water soak. SEEK MEDICAL CARE IF:   Symptoms get worse or do not improve in 2 to 4 weeks, despite treatment.  You develop numbness, weakness, or loss of bowel or bladder function.  New, unexplained symptoms develop. (Drugs used in treatment may produce side effects.) EXERCISES  RANGE OF MOTION (ROM) AND STRETCHING EXERCISES - Low Back Strain Most people with lower back pain will find that their symptoms get worse with excessive bending forward (flexion) or arching at the lower back (extension). The exercises which will help resolve your  symptoms will focus on the opposite motion.  Your physician, physical therapist or athletic trainer will help you determine which exercises will be most helpful to resolve your lower back pain. Do not complete any exercises without first consulting with your caregiver. Discontinue any exercises which make your symptoms worse until you speak to your caregiver.  If you have pain, numbness or tingling which travels down into your buttocks, leg or foot, the goal of the therapy is for these symptoms to move closer to your back and eventually resolve. Sometimes, these leg symptoms will get better, but your lower back pain may worsen. This is typically an indication of progress in your rehabilitation. Be very alert to any changes in your symptoms and the activities in which you participated in the 24 hours prior to the change. Sharing this information with your caregiver will allow him/her to most efficiently treat your condition.  These exercises may help you when beginning to rehabilitate your injury. Your symptoms may resolve with or without further involvement from your physician, physical therapist or athletic trainer. While completing these exercises, remember:  Restoring tissue flexibility helps normal motion to return to the joints. This allows healthier, less painful movement and activity.  An effective stretch should be held for at least 30 seconds.  A stretch should never be painful. You should only feel a gentle lengthening or release in  the stretched tissue. FLEXION RANGE OF MOTION AND STRETCHING EXERCISES: STRETCH - Flexion, Single Knee to Chest   Lie on a firm bed or floor with both legs extended in front of you.  Keeping one leg in contact with the floor, bring your opposite knee to your chest. Hold your leg in place by either grabbing behind your thigh or at your knee.  Pull until you feel a gentle stretch in your lower back. Hold __________ seconds.  Slowly release your grasp and  repeat the exercise with the opposite side. Repeat __________ times. Complete this exercise __________ times per day.  STRETCH - Flexion, Double Knee to Chest   Lie on a firm bed or floor with both legs extended in front of you.  Keeping one leg in contact with the floor, bring your opposite knee to your chest.  Tense your stomach muscles to support your back and then lift your other knee to your chest. Hold your legs in place by either grabbing behind your thighs or at your knees.  Pull both knees toward your chest until you feel a gentle stretch in your lower back. Hold __________ seconds.  Tense your stomach muscles and slowly return one leg at a time to the floor. Repeat __________ times. Complete this exercise __________ times per day.  STRETCH - Low Trunk Rotation  Lie on a firm bed or floor. Keeping your legs in front of you, bend your knees so they are both pointed toward the ceiling and your feet are flat on the floor.  Extend your arms out to the side. This will stabilize your upper body by keeping your shoulders in contact with the floor.  Gently and slowly drop both knees together to one side until you feel a gentle stretch in your lower back. Hold for __________ seconds.  Tense your stomach muscles to support your lower back as you bring your knees back to the starting position. Repeat the exercise to the other side. Repeat __________ times. Complete this exercise __________ times per day  EXTENSION RANGE OF MOTION AND FLEXIBILITY EXERCISES: STRETCH - Extension, Prone on Elbows   Lie on your stomach on the floor, a bed will be too soft. Place your palms about shoulder width apart and at the height of your head.  Place your elbows under your shoulders. If this is too painful, stack pillows under your chest.  Allow your body to relax so that your hips drop lower and make contact more completely with the floor.  Hold this position for __________ seconds.  Slowly return to  lying flat on the floor. Repeat __________ times. Complete this exercise __________ times per day.  RANGE OF MOTION - Extension, Prone Press Ups  Lie on your stomach on the floor, a bed will be too soft. Place your palms about shoulder width apart and at the height of your head.  Keeping your back as relaxed as possible, slowly straighten your elbows while keeping your hips on the floor. You may adjust the placement of your hands to maximize your comfort. As you gain motion, your hands will come more underneath your shoulders.  Hold this position __________ seconds.  Slowly return to lying flat on the floor. Repeat __________ times. Complete this exercise __________ times per day.  RANGE OF MOTION- Quadruped, Neutral Spine   Assume a hands and knees position on a firm surface. Keep your hands under your shoulders and your knees under your hips. You may place padding under your knees for  comfort.  Drop your head and point your tail bone toward the ground below you. This will round out your lower back like an angry cat. Hold this position for __________ seconds.  Slowly lift your head and release your tail bone so that your back sags into a large arch, like an old horse.  Hold this position for __________ seconds.  Repeat this until you feel limber in your lower back.  Now, find your "sweet spot." This will be the most comfortable position somewhere between the two previous positions. This is your neutral spine. Once you have found this position, tense your stomach muscles to support your lower back.  Hold this position for __________ seconds. Repeat __________ times. Complete this exercise __________ times per day.  STRENGTHENING EXERCISES - Low Back Strain These exercises may help you when beginning to rehabilitate your injury. These exercises should be done near your "sweet spot." This is the neutral, low-back arch, somewhere between fully rounded and fully arched, that is your least  painful position. When performed in this safe range of motion, these exercises can be used for people who have either a flexion or extension based injury. These exercises may resolve your symptoms with or without further involvement from your physician, physical therapist or athletic trainer. While completing these exercises, remember:   Muscles can gain both the endurance and the strength needed for everyday activities through controlled exercises.  Complete these exercises as instructed by your physician, physical therapist or athletic trainer. Increase the resistance and repetitions only as guided.  You may experience muscle soreness or fatigue, but the pain or discomfort you are trying to eliminate should never worsen during these exercises. If this pain does worsen, stop and make certain you are following the directions exactly. If the pain is still present after adjustments, discontinue the exercise until you can discuss the trouble with your caregiver. STRENGTHENING - Deep Abdominals, Pelvic Tilt  Lie on a firm bed or floor. Keeping your legs in front of you, bend your knees so they are both pointed toward the ceiling and your feet are flat on the floor.  Tense your lower abdominal muscles to press your lower back into the floor. This motion will rotate your pelvis so that your tail bone is scooping upwards rather than pointing at your feet or into the floor.  With a gentle tension and even breathing, hold this position for __________ seconds. Repeat __________ times. Complete this exercise __________ times per day.  STRENGTHENING - Abdominals, Crunches   Lie on a firm bed or floor. Keeping your legs in front of you, bend your knees so they are both pointed toward the ceiling and your feet are flat on the floor. Cross your arms over your chest.  Slightly tip your chin down without bending your neck.  Tense your abdominals and slowly lift your trunk high enough to just clear your shoulder  blades. Lifting higher can put excessive stress on the lower back and does not further strengthen your abdominal muscles.  Control your return to the starting position. Repeat __________ times. Complete this exercise __________ times per day.  STRENGTHENING - Quadruped, Opposite UE/LE Lift   Assume a hands and knees position on a firm surface. Keep your hands under your shoulders and your knees under your hips. You may place padding under your knees for comfort.  Find your neutral spine and gently tense your abdominal muscles so that you can maintain this position. Your shoulders and hips should form a  rectangle that is parallel with the floor and is not twisted.  Keeping your trunk steady, lift your right hand no higher than your shoulder and then your left leg no higher than your hip. Make sure you are not holding your breath. Hold this position __________ seconds.  Continuing to keep your abdominal muscles tense and your back steady, slowly return to your starting position. Repeat with the opposite arm and leg. Repeat __________ times. Complete this exercise __________ times per day.  STRENGTHENING - Lower Abdominals, Double Knee Lift  Lie on a firm bed or floor. Keeping your legs in front of you, bend your knees so they are both pointed toward the ceiling and your feet are flat on the floor.  Tense your abdominal muscles to brace your lower back and slowly lift both of your knees until they come over your hips. Be certain not to hold your breath.  Hold __________ seconds. Using your abdominal muscles, return to the starting position in a slow and controlled manner. Repeat __________ times. Complete this exercise __________ times per day.  POSTURE AND BODY MECHANICS CONSIDERATIONS - Low Back Strain Keeping correct posture when sitting, standing or completing your activities will reduce the stress put on different body tissues, allowing injured tissues a chance to heal and limiting painful  experiences. The following are general guidelines for improved posture. Your physician or physical therapist will provide you with any instructions specific to your needs. While reading these guidelines, remember:  The exercises prescribed by your provider will help you have the flexibility and strength to maintain correct postures.  The correct posture provides the best environment for your joints to work. All of your joints have less wear and tear when properly supported by a spine with good posture. This means you will experience a healthier, less painful body.  Correct posture must be practiced with all of your activities, especially prolonged sitting and standing. Correct posture is as important when doing repetitive low-stress activities (typing) as it is when doing a single heavy-load activity (lifting). RESTING POSITIONS Consider which positions are most painful for you when choosing a resting position. If you have pain with flexion-based activities (sitting, bending, stooping, squatting), choose a position that allows you to rest in a less flexed posture. You would want to avoid curling into a fetal position on your side. If your pain worsens with extension-based activities (prolonged standing, working overhead), avoid resting in an extended position such as sleeping on your stomach. Most people will find more comfort when they rest with their spine in a more neutral position, neither too rounded nor too arched. Lying on a non-sagging bed on your side with a pillow between your knees, or on your back with a pillow under your knees will often provide some relief. Keep in mind, being in any one position for a prolonged period of time, no matter how correct your posture, can still lead to stiffness. PROPER SITTING POSTURE In order to minimize stress and discomfort on your spine, you must sit with correct posture. Sitting with good posture should be effortless for a healthy body. Returning to good  posture is a gradual process. Many people can work toward this most comfortably by using various supports until they have the flexibility and strength to maintain this posture on their own. When sitting with proper posture, your ears will fall over your shoulders and your shoulders will fall over your hips. You should use the back of the chair to support your upper back.  Your lower back will be in a neutral position, just slightly arched. You may place a small pillow or folded towel at the base of your lower back for support.  When working at a desk, create an environment that supports good, upright posture. Without extra support, muscles tire, which leads to excessive strain on joints and other tissues. Keep these recommendations in mind: CHAIR:  A chair should be able to slide under your desk when your back makes contact with the back of the chair. This allows you to work closely.  The chair's height should allow your eyes to be level with the upper part of your monitor and your hands to be slightly lower than your elbows. BODY POSITION  Your feet should make contact with the floor. If this is not possible, use a foot rest.  Keep your ears over your shoulders. This will reduce stress on your neck and lower back. INCORRECT SITTING POSTURES  If you are feeling tired and unable to assume a healthy sitting posture, do not slouch or slump. This puts excessive strain on your back tissues, causing more damage and pain. Healthier options include:  Using more support, like a lumbar pillow.  Switching tasks to something that requires you to be upright or walking.  Talking a brief walk.  Lying down to rest in a neutral-spine position. PROLONGED STANDING WHILE SLIGHTLY LEANING FORWARD  When completing a task that requires you to lean forward while standing in one place for a long time, place either foot up on a stationary 2-4 inch high object to help maintain the best posture. When both feet are on the  ground, the lower back tends to lose its slight inward curve. If this curve flattens (or becomes too large), then the back and your other joints will experience too much stress, tire more quickly, and can cause pain. CORRECT STANDING POSTURES Proper standing posture should be assumed with all daily activities, even if they only take a few moments, like when brushing your teeth. As in sitting, your ears should fall over your shoulders and your shoulders should fall over your hips. You should keep a slight tension in your abdominal muscles to brace your spine. Your tailbone should point down to the ground, not behind your body, resulting in an over-extended swayback posture.  INCORRECT STANDING POSTURES  Common incorrect standing postures include a forward head, locked knees and/or an excessive swayback. WALKING Walk with an upright posture. Your ears, shoulders and hips should all line-up. PROLONGED ACTIVITY IN A FLEXED POSITION When completing a task that requires you to bend forward at your waist or lean over a low surface, try to find a way to stabilize 3 out of 4 of your limbs. You can place a hand or elbow on your thigh or rest a knee on the surface you are reaching across. This will provide you more stability so that your muscles do not fatigue as quickly. By keeping your knees relaxed, or slightly bent, you will also reduce stress across your lower back. CORRECT LIFTING TECHNIQUES DO :   Assume a wide stance. This will provide you more stability and the opportunity to get as close as possible to the object which you are lifting.  Tense your abdominals to brace your spine. Bend at the knees and hips. Keeping your back locked in a neutral-spine position, lift using your leg muscles. Lift with your legs, keeping your back straight.  Test the weight of unknown objects before attempting to lift them.  Try to keep your elbows locked down at your sides in order get the best strength from your  shoulders when carrying an object.  Always ask for help when lifting heavy or awkward objects. INCORRECT LIFTING TECHNIQUES DO NOT:   Lock your knees when lifting, even if it is a small object.  Bend and twist. Pivot at your feet or move your feet when needing to change directions.  Assume that you can safely pick up even a paper clip without proper posture.   This information is not intended to replace advice given to you by your health care provider. Make sure you discuss any questions you have with your health care provider.   Document Released: 07/15/2005 Document Revised: 08/05/2014 Document Reviewed: 10/27/2008 [employer] Interactive Patient Education [employer].

## 2015-11-21 NOTE — Progress Notes (Signed)
Subjective:    Patient ID: Scott Conley, male    DOB: [birth year], 75 y.o.   MRN: [id number]  Chief Complaint  Patient presents with  . Establish Care    have been having back pain, x1 week    HPI:  Scott Conley is a 75 y.o. male who  has a past medical history of Blood clot in vein; Arthritis; Diabetes ([place]); CVA (cerebral infarction); Hypertension; and Stroke [facility]). and presents today For an office visit to establish care.  1.) Hypertension - Currently managed with carvedilol and ramapril. Reports that he has been out of the ramapril for about 2 days. Generally takes his medication as prescribed and denies adverse side effects or hypotensive readings. Does not currently monitor his blood pressure at home.   2.) Type 2 diabetes - Currently maintained on Januvia. Reports taking the medication as prescribed and denies adverse side effects. Does not currently check his blood sugars at home. Denies any symptoms of end organ damage.   Lab Results  Component Value Date   HGBA1C 7.3* 11/21/2015   3.) Back pain - This is a new problem. Associated symptom of pain located on the right side of his lower back has been going on for about 2 weeks. Does not recall any trauma or sounds/sensations heard or felt. Pain is described as achy and generally improved with sitting. There is occasional sharp pains. No modifying factors/treatments to make it better. No radiculopathy. No changes to bowel/bladder habits or saddle anesthesia.   No Known Allergies   Outpatient Prescriptions Prior to Visit  Medication Sig Dispense Refill  . aspirin 325 MG tablet Take 1 tablet (325 mg total) by mouth daily. New blood thinner to prevent strokes 30 tablet   . atorvastatin (LIPITOR) 80 MG tablet Take 1 tablet (80 mg total) by mouth daily. 90 tablet 0  . carvedilol (COREG) 6.25 MG tablet Take 1 tablet (6.25 mg total) by mouth 2 (two) times daily with a meal. 120 tablet 0  . diphenhydrAMINE (BENADRYL) 25 mg  capsule Take 25 mg by mouth at bedtime as needed for sleep.    Marland Kitchen JANUVIA 50 MG tablet Take 1 tablet by mouth daily.    Marland Kitchen senna-docusate (SENOKOT-S) 8.6-50 MG per tablet Take 1 tablet by mouth at bedtime. For constipation 90 tablet 1  . sitaGLIPtin (JANUVIA) 50 MG tablet Take 1 tablet (50 mg total) by mouth daily. 90 tablet 0  . atorvastatin (LIPITOR) 80 MG tablet Take 1 tablet (80 mg total) by mouth daily at 6 PM. 30 tablet 0  . carvedilol (COREG) 6.25 MG tablet Take 1 tablet (6.25 mg total) by mouth 2 (two) times daily with a meal. 60 tablet 0  . ramipril (ALTACE) 2.5 MG capsule Take 1 capsule (2.5 mg total) by mouth daily. For blood pressure 30 capsule 1  . ramipril (ALTACE) 2.5 MG capsule Take 1 capsule (2.5 mg total) by mouth daily. 90 capsule 0   No facility-administered medications prior to visit.     Past Medical History  Diagnosis Date  . Blood clot in vein   . Arthritis   . Diabetes ([place])   . CVA (cerebral infarction)   . Hypertension   . Stroke [facility])     Left hand stiffness      Past Surgical History  Procedure Laterality Date  . Cyst removal leg      removed from right groin  . Loop recorder implant  11/29/13    MDT LinQ implanted  by Dr Caryl Comes for cryptogenic stroke  . Tee without cardioversion N/A 11/29/2013    Procedure: TRANSESOPHAGEAL ECHOCARDIOGRAM (TEE);  Surgeon: Sueanne Margarita, MD;  Location: [place];  Service: Cardiovascular;  Laterality: N/A;  . Loop recorder implant N/A 11/29/2013    Procedure: LOOP RECORDER IMPLANT;  Surgeon: Deboraha Sprang, MD;  Location: [facility] CATH LAB;  Service: Cardiovascular;  Laterality: N/A;     Family History  Problem Relation Age of Onset  . Breast cancer Mother   . Breast cancer Maternal Grandmother      Social History   Social History  . Marital Status: Widowed    Spouse Name: N/A  . Number of Children: 4  . Years of Education: 14   Occupational History  . Retired    Social History Main Topics  . Smoking status:  Former Smoker -- 0.50 packs/day for 58 years    Types: Cigarettes    Start date: 11/30/2013  . Smokeless tobacco: Never Used  . Alcohol Use: No     Comment: none since 1985  . Drug Use: No  . Sexual Activity: Not on file   Other Topics Concern  . Not on file   Social History Narrative   Fun: playing billiards   Denies abuse and feels safe at home.       Review of Systems  Constitutional: Negative for fever and chills.  Eyes:       Negative for changes in vision  Respiratory: Negative for cough, chest tightness and wheezing.   Cardiovascular: Negative for chest pain, palpitations and leg swelling.  Endocrine: Negative for polydipsia, polyphagia and polyuria.  Musculoskeletal: Positive for back pain.  Neurological: Negative for dizziness, weakness and light-headedness.      Objective:    BP 140/84 mmHg  Pulse 72  Temp(Src) 97.9 F (36.6 C) (Oral)  Resp 14  Ht '6\' 4"'$  (1.93 m)  Wt 248 lb (112.492 kg)  BMI 30.20 kg/m2  SpO2 94% Nursing note and vital signs reviewed.  Physical Exam  Constitutional: He is oriented to person, place, and time. He appears well-developed and well-nourished. No distress.  Cardiovascular: Normal rate, regular rhythm, normal heart sounds and intact distal pulses.   Pulmonary/Chest: Effort normal and breath sounds normal.  Musculoskeletal:  Low back - no obvious deformity, discoloration, or edema. Mild tenderness elicited on right lumbar paraspinal musculature. Range of motion slightly restricted in flexion with no pain noted during movements. Straight leg raises negative. Faber's test is negative. Distal pulses and sensation are intact and appropriate.  Neurological: He is alert and oriented to person, place, and time.  Skin: Skin is warm and dry.  Psychiatric: He has a normal mood and affect. His behavior is normal. Judgment and thought content normal.       Assessment & Plan:   Problem List Items Addressed This Visit      Cardiovascular  and Mediastinum   Stroke ([place])    At risk for stroke given previous stroke and monitoring through risk factor adjustment and optimization. Hypertension and diabetes are the main risks associated with this time.      Relevant Medications   ramipril (ALTACE) 2.5 MG capsule   Essential hypertension    Blood pressure is at goal with today's measurement. He has not taken his ramipril in the last 2 days and continues to take the carvedilol. No adverse side effects or end organ system damage noted in addition to previously identified stroke. Continue current dosage of ramipril and carvedilol.  Encouraged to monitor blood pressure at home. Continue lifestyle management with nutrition and exercise.      Relevant Medications   ramipril (ALTACE) 2.5 MG capsule   Other Relevant Orders   Basic Metabolic Panel (BMET) (Completed)     Endocrine   Type 2 diabetes mellitus ([place]) - Primary    Type 2 diabetes diabetes with out of date A1c. Obtain A1c and basic metabolic profile. Maintained on Januvia with no hypoglycemic events or adverse side effects. Diabetic foot exam completed today. Encouraged diabetic eye exam to be completed independently. Maintained on ramipril and atorvastatin for CAD risk reduction. Declines Pneumovax. Continue current dosage of Januvia pending A1c results.      Relevant Medications   ramipril (ALTACE) 2.5 MG capsule   Other Relevant Orders   Basic Metabolic Panel (BMET) (Completed)   Hemoglobin A1c (Completed)     Musculoskeletal and Integument   Low back strain    Symptoms and exam consistent with low back strain. Treat conservatively with ice/heat, home exercise therapy and over-the-counter acetaminophen as needed for discomfort. Follow-up in 3 weeks or sooner if symptoms worsen or do not improve.          I am having Mr. Bogart maintain his diphenhydrAMINE, aspirin, JANUVIA, senna-docusate, atorvastatin, carvedilol, sitaGLIPtin, and ramipril.   Meds ordered this  encounter  Medications  . ramipril (ALTACE) 2.5 MG capsule    Sig: Take 1 capsule (2.5 mg total) by mouth daily.    Dispense:  90 capsule    Refill:  0    Order Specific Question:  Supervising Provider    Answer:  Hoyt Koch [[id number]]     Follow-up: Return Annual physical.  Scott Po, FNP

## 2015-11-21 NOTE — Assessment & Plan Note (Signed)
Symptoms and exam consistent with low back strain. Treat conservatively with ice/heat, home exercise therapy and over-the-counter acetaminophen as needed for discomfort. Follow-up in 3 weeks or sooner if symptoms worsen or do not improve.

## 2015-11-21 NOTE — Assessment & Plan Note (Signed)
Blood pressure is at goal with today's measurement. He has not taken his ramipril in the last 2 days and continues to take the carvedilol. No adverse side effects or end organ system damage noted in addition to previously identified stroke. Continue current dosage of ramipril and carvedilol. Encouraged to monitor blood pressure at home. Continue lifestyle management with nutrition and exercise.

## 2015-11-21 NOTE — Progress Notes (Signed)
Pre visit review using our clinic review tool, if applicable. No additional management support is needed unless otherwise documented below in the visit note. 

## 2015-11-21 NOTE — Assessment & Plan Note (Signed)
At risk for stroke given previous stroke and monitoring through risk factor adjustment and optimization. Hypertension and diabetes are the main risks associated with this time.

## 2015-11-22 ENCOUNTER — Telehealth: Payer: Self-pay | Admitting: Family

## 2015-11-22 NOTE — Telephone Encounter (Signed)
Please inform patient that his A1c is 7.3 and his kidney function and electrolytes are within the normal limits. Please continue to take the Januvia and we will plan to follow up for your annual exam or about 4 months.

## 2015-11-23 NOTE — Telephone Encounter (Signed)
Pt aware of results 

## 2015-12-05 ENCOUNTER — Ambulatory Visit (INDEPENDENT_AMBULATORY_CARE_PROVIDER_SITE_OTHER): Payer: Medicare Other | Admitting: *Deleted

## 2015-12-05 DIAGNOSIS — I639 Cerebral infarction, unspecified: Secondary | ICD-10-CM

## 2015-12-06 NOTE — Progress Notes (Signed)
Carelink Summary Report / Loop Recorder 

## 2015-12-20 LAB — CUP PACEART REMOTE DEVICE CHECK: Date Time Interrogation Session: 20170310193756

## 2015-12-24 LAB — CUP PACEART REMOTE DEVICE CHECK: Date Time Interrogation Session: 20170409200628

## 2015-12-24 NOTE — Progress Notes (Signed)
Carelink summary report received. Battery status OK. Normal device function. No new symptom episodes, tachy episodes, brady, or pause episodes. No new AF episodes. Monthly summary reports and ROV/PRN 

## 2016-01-04 ENCOUNTER — Ambulatory Visit (INDEPENDENT_AMBULATORY_CARE_PROVIDER_SITE_OTHER): Payer: Medicare Other | Admitting: *Deleted

## 2016-01-04 DIAGNOSIS — I639 Cerebral infarction, unspecified: Secondary | ICD-10-CM

## 2016-01-05 NOTE — Progress Notes (Signed)
Carelink Summary Report / Loop Recorder 

## 2016-01-12 LAB — CUP PACEART REMOTE DEVICE CHECK
Date Time Interrogation Session: 20170608203658
MDC IDC SESS DTM: 20170509201205

## 2016-01-25 ENCOUNTER — Telehealth: Payer: Self-pay | Admitting: Family

## 2016-01-25 DIAGNOSIS — I10 Essential (primary) hypertension: Secondary | ICD-10-CM

## 2016-01-25 MED ORDER — ATORVASTATIN CALCIUM 80 MG PO TABS: 80.0000 mg | ORAL_TABLET | Freq: Every day | ORAL | Status: DC

## 2016-01-25 MED ORDER — RAMIPRIL 2.5 MG PO CAPS: 2.5000 mg | ORAL_CAPSULE | Freq: Every day | ORAL | Status: DC

## 2016-01-25 MED ORDER — SITAGLIPTIN PHOSPHATE 50 MG PO TABS
50.0000 mg | ORAL_TABLET | Freq: Every day | ORAL | Status: DC
Start: 2016-01-25 — End: 2016-04-22

## 2016-01-25 MED ORDER — CARVEDILOL 6.25 MG PO TABS: 6.2500 mg | ORAL_TABLET | Freq: Two times a day (BID) | ORAL | Status: DC

## 2016-01-25 NOTE — Telephone Encounter (Signed)
Patient needs prescription refilled for Atorvastatin, Januvia, Ramipril and Carvedilol. Verify pharmacy.

## 2016-01-25 NOTE — Telephone Encounter (Signed)
LVM letting pt know that medications have been sent.

## 2016-01-29 ENCOUNTER — Other Ambulatory Visit: Payer: Self-pay

## 2016-02-05 ENCOUNTER — Ambulatory Visit (INDEPENDENT_AMBULATORY_CARE_PROVIDER_SITE_OTHER): Payer: Medicare Other | Admitting: *Deleted

## 2016-02-05 DIAGNOSIS — I639 Cerebral infarction, unspecified: Secondary | ICD-10-CM

## 2016-02-05 NOTE — Progress Notes (Signed)
Carelink Summary Report / Loop Recorder 

## 2016-03-04 ENCOUNTER — Ambulatory Visit (INDEPENDENT_AMBULATORY_CARE_PROVIDER_SITE_OTHER): Payer: Medicare Other | Admitting: *Deleted

## 2016-03-04 DIAGNOSIS — I639 Cerebral infarction, unspecified: Secondary | ICD-10-CM | POA: Diagnosis not present

## 2016-03-05 NOTE — Progress Notes (Signed)
Carelink Summary Report / Loop Recorder 

## 2016-03-06 ENCOUNTER — Telehealth: Payer: Self-pay | Admitting: Cardiology

## 2016-03-06 NOTE — Telephone Encounter (Signed)
LMOVM requesting that pt send manual transmission b/c home monitor has not updated in at least 14 days.    

## 2016-03-07 LAB — CUP PACEART REMOTE DEVICE CHECK: MDC IDC SESS DTM: 20170710180140

## 2016-03-11 LAB — CUP PACEART REMOTE DEVICE CHECK: Date Time Interrogation Session: 20170807214026

## 2016-04-03 ENCOUNTER — Ambulatory Visit (INDEPENDENT_AMBULATORY_CARE_PROVIDER_SITE_OTHER): Payer: Medicare Other | Admitting: *Deleted

## 2016-04-03 DIAGNOSIS — I639 Cerebral infarction, unspecified: Secondary | ICD-10-CM | POA: Diagnosis not present

## 2016-04-04 NOTE — Progress Notes (Signed)
Carelink Summary Report / Loop Recorder 

## 2016-04-22 ENCOUNTER — Telehealth: Payer: Self-pay | Admitting: *Deleted

## 2016-04-22 MED ORDER — SITAGLIPTIN PHOSPHATE 50 MG PO TABS: 50.0000 mg | ORAL_TABLET | Freq: Every day | ORAL | 5 refills | Status: DC

## 2016-04-22 MED ORDER — ATORVASTATIN CALCIUM 80 MG PO TABS
80.0000 mg | ORAL_TABLET | Freq: Every day | ORAL | 5 refills | Status: DC
Start: 2016-04-22 — End: 2016-10-23

## 2016-04-22 MED ORDER — CARVEDILOL 6.25 MG PO TABS: 6.2500 mg | ORAL_TABLET | Freq: Two times a day (BID) | ORAL | 5 refills | Status: DC

## 2016-04-22 NOTE — Telephone Encounter (Signed)
Pt left msg on triage requesting med refills. Called pt back to verify which meds need to be refill inform pt will send to [facility]...Scott Conley

## 2016-05-03 ENCOUNTER — Ambulatory Visit (INDEPENDENT_AMBULATORY_CARE_PROVIDER_SITE_OTHER): Payer: Medicare Other | Admitting: *Deleted

## 2016-05-03 DIAGNOSIS — I639 Cerebral infarction, unspecified: Secondary | ICD-10-CM | POA: Diagnosis not present

## 2016-05-04 LAB — CUP PACEART REMOTE DEVICE CHECK: Date Time Interrogation Session: 20170906221121

## 2016-05-04 NOTE — Progress Notes (Signed)
Carelink summary report received. Battery status OK. Normal device function. No new symptom episodes, tachy episodes, brady, or pause episodes. No new AF episodes. Monthly summary reports and ROV/PRN 

## 2016-05-06 NOTE — Progress Notes (Signed)
Carelink Summary Report / Loop Recorder 

## 2016-05-21 ENCOUNTER — Other Ambulatory Visit: Payer: Self-pay | Admitting: *Deleted

## 2016-05-21 DIAGNOSIS — I10 Essential (primary) hypertension: Secondary | ICD-10-CM

## 2016-05-21 MED ORDER — RAMIPRIL 2.5 MG PO CAPS: 2.5000 mg | ORAL_CAPSULE | Freq: Every day | ORAL | 1 refills | Status: DC

## 2016-05-23 ENCOUNTER — Telehealth: Payer: Self-pay | Admitting: Cardiology

## 2016-05-23 NOTE — Telephone Encounter (Signed)
LMOVM requesting that pt send manual transmission b/c home monitor has not updated in at least 14 days.    

## 2016-06-03 ENCOUNTER — Ambulatory Visit (INDEPENDENT_AMBULATORY_CARE_PROVIDER_SITE_OTHER): Payer: Medicare Other | Admitting: *Deleted

## 2016-06-03 DIAGNOSIS — I639 Cerebral infarction, unspecified: Secondary | ICD-10-CM | POA: Diagnosis not present

## 2016-06-04 NOTE — Progress Notes (Signed)
Carelink Summary Report / Loop Recorder 

## 2016-06-09 LAB — CUP PACEART REMOTE DEVICE CHECK
Date Time Interrogation Session: 20171006233745
Implantable Pulse Generator Implant Date: 20150504

## 2016-06-09 NOTE — Progress Notes (Signed)
Carelink summary report received. Battery status OK. Normal device function. No new symptom episodes, tachy episodes, brady, or pause episodes. No new AF episodes. Monthly summary reports and ROV/PRN 

## 2016-07-02 ENCOUNTER — Ambulatory Visit (INDEPENDENT_AMBULATORY_CARE_PROVIDER_SITE_OTHER): Payer: Medicare Other | Admitting: *Deleted

## 2016-07-02 DIAGNOSIS — I639 Cerebral infarction, unspecified: Secondary | ICD-10-CM

## 2016-07-03 NOTE — Progress Notes (Signed)
Carelink Summary Report / Loop Recorder 

## 2016-07-18 LAB — CUP PACEART REMOTE DEVICE CHECK
Date Time Interrogation Session: 20171106004157
MDC IDC PG IMPLANT DT: 20150504

## 2016-07-18 NOTE — Progress Notes (Signed)
Carelink summary report received. Battery status OK. Normal device function. No new symptom episodes, tachy episodes, brady, or pause episodes. No new AF episodes. Monthly summary reports and ROV/PRN 

## 2016-08-01 ENCOUNTER — Ambulatory Visit (INDEPENDENT_AMBULATORY_CARE_PROVIDER_SITE_OTHER): Payer: Medicare Other | Admitting: *Deleted

## 2016-08-01 DIAGNOSIS — I639 Cerebral infarction, unspecified: Secondary | ICD-10-CM

## 2016-08-02 NOTE — Progress Notes (Signed)
Carelink Summary Report / Loop Recorder 

## 2016-08-09 ENCOUNTER — Telehealth: Payer: Self-pay | Admitting: Cardiology

## 2016-08-09 NOTE — Telephone Encounter (Signed)
LM for pt to returncall

## 2016-08-18 LAB — CUP PACEART REMOTE DEVICE CHECK
Implantable Pulse Generator Implant Date: 20150504
MDC IDC SESS DTM: 20171206004225

## 2016-08-18 NOTE — Progress Notes (Signed)
Carelink summary report received. Battery status OK. Normal device function. No new symptom episodes, tachy episodes, brady, or pause episodes. No new AF episodes. Monthly summary reports and ROV/PRN 

## 2016-09-02 ENCOUNTER — Ambulatory Visit (INDEPENDENT_AMBULATORY_CARE_PROVIDER_SITE_OTHER): Payer: Medicare Other | Admitting: *Deleted

## 2016-09-02 DIAGNOSIS — I639 Cerebral infarction, unspecified: Secondary | ICD-10-CM | POA: Diagnosis not present

## 2016-09-03 NOTE — Progress Notes (Signed)
Carelink Summary Report / Loop Recorder 

## 2016-09-14 LAB — CUP PACEART REMOTE DEVICE CHECK
Date Time Interrogation Session: 20180105010904
MDC IDC PG IMPLANT DT: 20150504

## 2016-09-14 NOTE — Progress Notes (Signed)
Carelink summary report received. Battery status OK. Normal device function. No new symptom episodes, tachy episodes, brady, or pause episodes. No new AF episodes. Monthly summary reports and ROV/PRN 

## 2016-09-28 LAB — CUP PACEART REMOTE DEVICE CHECK
Implantable Pulse Generator Implant Date: 20150504
MDC IDC SESS DTM: 20180204011617

## 2016-09-28 NOTE — Progress Notes (Signed)
Carelink summary report received. Battery status OK. Normal device function. No new symptom episodes, tachy episodes, brady, or pause episodes. No new AF episodes. Monthly summary reports and ROV/PRN 

## 2016-09-30 ENCOUNTER — Ambulatory Visit (INDEPENDENT_AMBULATORY_CARE_PROVIDER_SITE_OTHER): Payer: Medicare Other | Admitting: *Deleted

## 2016-09-30 DIAGNOSIS — I639 Cerebral infarction, unspecified: Secondary | ICD-10-CM

## 2016-10-01 NOTE — Progress Notes (Signed)
Carelink Summary Report / Loop Recorder 

## 2016-10-15 ENCOUNTER — Other Ambulatory Visit: Payer: Self-pay

## 2016-10-15 MED ORDER — SITAGLIPTIN PHOSPHATE 50 MG PO TABS: 50.0000 mg | ORAL_TABLET | Freq: Every day | ORAL | 0 refills | Status: DC

## 2016-10-17 ENCOUNTER — Telehealth: Payer: Self-pay | Admitting: Cardiology

## 2016-10-17 NOTE — Telephone Encounter (Signed)
Spoke w/ pt and requested that he send a manual transmission b/c his home monitor has not updated in at least 14 days.   

## 2016-10-23 ENCOUNTER — Telehealth: Payer: Self-pay | Admitting: *Deleted

## 2016-10-23 MED ORDER — ATORVASTATIN CALCIUM 80 MG PO TABS: 80.0000 mg | ORAL_TABLET | Freq: Every day | ORAL | 0 refills | Status: DC

## 2016-10-23 MED ORDER — SITAGLIPTIN PHOSPHATE 50 MG PO TABS
50.0000 mg | ORAL_TABLET | Freq: Every day | ORAL | 0 refills | Status: DC
Start: 2016-10-23 — End: 2016-11-21

## 2016-10-23 NOTE — Telephone Encounter (Signed)
Rec'd call pt states he is needing refill on his Januvia and atorvastatin. Inform pt he is due for yearly appt in April can only send 30 day to pharmacy until he come in for appt. Sent 30 day to [facility]...Scott Conley

## 2016-10-30 ENCOUNTER — Ambulatory Visit (INDEPENDENT_AMBULATORY_CARE_PROVIDER_SITE_OTHER): Payer: Medicare Other | Admitting: *Deleted

## 2016-10-30 DIAGNOSIS — I639 Cerebral infarction, unspecified: Secondary | ICD-10-CM

## 2016-10-31 NOTE — Progress Notes (Signed)
Carelink Summary Report / Loop Recorder 

## 2016-11-01 LAB — CUP PACEART REMOTE DEVICE CHECK
MDC IDC PG IMPLANT DT: 20150504
MDC IDC SESS DTM: 20180306013750

## 2016-11-08 LAB — CUP PACEART REMOTE DEVICE CHECK
Date Time Interrogation Session: 20180405014134
MDC IDC PG IMPLANT DT: 20150504

## 2016-11-21 ENCOUNTER — Encounter: Payer: Self-pay | Admitting: Family

## 2016-11-21 ENCOUNTER — Other Ambulatory Visit: Payer: Self-pay | Admitting: General Practice

## 2016-11-21 ENCOUNTER — Ambulatory Visit (INDEPENDENT_AMBULATORY_CARE_PROVIDER_SITE_OTHER): Payer: Medicare Other | Admitting: Family

## 2016-11-21 ENCOUNTER — Other Ambulatory Visit (INDEPENDENT_AMBULATORY_CARE_PROVIDER_SITE_OTHER): Payer: Medicare Other

## 2016-11-21 VITALS — BP 124/70 | HR 75 | Temp 97.9°F | Resp 16 | Ht 76.0 in | Wt 265.8 lb

## 2016-11-21 DIAGNOSIS — E6609 Other obesity due to excess calories: Secondary | ICD-10-CM

## 2016-11-21 DIAGNOSIS — Z6830 Body mass index (BMI) 30.0-30.9, adult: Secondary | ICD-10-CM

## 2016-11-21 DIAGNOSIS — E119 Type 2 diabetes mellitus without complications: Secondary | ICD-10-CM

## 2016-11-21 DIAGNOSIS — I10 Essential (primary) hypertension: Secondary | ICD-10-CM | POA: Diagnosis not present

## 2016-11-21 DIAGNOSIS — Z0000 Encounter for general adult medical examination without abnormal findings: Secondary | ICD-10-CM

## 2016-11-21 DIAGNOSIS — I639 Cerebral infarction, unspecified: Secondary | ICD-10-CM

## 2016-11-21 DIAGNOSIS — E669 Obesity, unspecified: Secondary | ICD-10-CM | POA: Insufficient documentation

## 2016-11-21 DIAGNOSIS — Z125 Encounter for screening for malignant neoplasm of prostate: Secondary | ICD-10-CM | POA: Diagnosis not present

## 2016-11-21 LAB — COMPREHENSIVE METABOLIC PANEL
ALT: 20 U/L (ref 0–53)
AST: 21 U/L (ref 0–37)
Albumin: 4 g/dL (ref 3.5–5.2)
Alkaline Phosphatase: 73 U/L (ref 39–117)
BILIRUBIN TOTAL: 0.6 mg/dL (ref 0.2–1.2)
BUN: 13 mg/dL (ref 6–23)
CO2: 26 meq/L (ref 19–32)
CREATININE: 1.16 mg/dL (ref 0.40–1.50)
Calcium: 9.6 mg/dL (ref 8.4–10.5)
Chloride: 107 mEq/L (ref 96–112)
GFR: 78.5 mL/min (ref >60.00)
Glucose, Bld: 145 mg/dL — ABNORMAL HIGH (ref 70–99)
Potassium: 4.6 mEq/L (ref 3.5–5.1)
Sodium: 139 mEq/L (ref 135–145)
Total Protein: 7.6 g/dL (ref 6.0–8.3)

## 2016-11-21 LAB — MICROALBUMIN / CREATININE URINE RATIO
Creatinine,U: 622.4 mg/dL
Microalb Creat Ratio: 1 mg/g (ref 0.0–30.0)
Microalb, Ur: 6.4 mg/dL — ABNORMAL HIGH (ref 0.0–1.9)

## 2016-11-21 LAB — CBC
HCT: 38.8 % — ABNORMAL LOW (ref 39.0–52.0)
Hemoglobin: 12.3 g/dL — ABNORMAL LOW (ref 13.0–17.0)
MCHC: 31.8 g/dL (ref 30.0–36.0)
MCV: 79.1 fl (ref 78.0–100.0)
Platelets: 297 10*3/uL (ref 150.0–400.0)
RBC: 4.9 Mil/uL (ref 4.22–5.81)
RDW: 17.6 % — ABNORMAL HIGH (ref 11.5–15.5)
WBC: 7.9 10*3/uL (ref 4.0–10.5)

## 2016-11-21 LAB — LIPID PANEL
CHOL/HDL RATIO: 2
Cholesterol: 104 mg/dL (ref 0–200)
HDL: 43 mg/dL (ref >39.00)
LDL CALC: 50 mg/dL (ref 0–99)
NONHDL: 60.63
Triglycerides: 55 mg/dL (ref 0.0–149.0)
VLDL: 11 mg/dL (ref 0.0–40.0)

## 2016-11-21 LAB — HEMOGLOBIN A1C: Hgb A1c MFr Bld: 7.2 % — ABNORMAL HIGH (ref 4.6–6.5)

## 2016-11-21 MED ORDER — SITAGLIPTIN PHOSPHATE 100 MG PO TABS: 100.0000 mg | ORAL_TABLET | Freq: Every day | ORAL | 3 refills | Status: DC

## 2016-11-21 MED ORDER — CARVEDILOL 6.25 MG PO TABS
6.2500 mg | ORAL_TABLET | Freq: Two times a day (BID) | ORAL | 5 refills | Status: DC
Start: 2016-11-21 — End: 2020-03-05

## 2016-11-21 MED ORDER — RAMIPRIL 2.5 MG PO CAPS: 2.5000 mg | ORAL_CAPSULE | Freq: Every day | ORAL | 1 refills | Status: DC

## 2016-11-21 MED ORDER — ATORVASTATIN CALCIUM 80 MG PO TABS: 80.0000 mg | ORAL_TABLET | Freq: Every day | ORAL | 1 refills | Status: DC

## 2016-11-21 NOTE — Assessment & Plan Note (Signed)
Reviewed and updated patient's medical, surgical, family and social history. Medications and allergies were also reviewed. Basic screenings for depression, activities of daily living, hearing, cognition and safety were performed. Provider list was updated and health plan was provided to the patient.   Declines Prevnar, Pneumovax, and tetanus. All other immunizations are up-to-date per recommendations. Due for dental and vision exam encouraged to be completed independently. Obtain PSA for prostate cancer screening. All other screenings are up-to-date per recommendations.  Overall well exam with risk factors for cardiovascular disease including obesity, previous stroke, type 2 diabetes, and hypertension. Recommend weight loss of 5-10% of current body weight through nutrition and physical activity changes. Goal is 30 minutes of moderate level activity daily or approximately 10,000 steps per day. He does not smoke anymore. Encourage nutritional intake that is moderate, balance, and varied. Continue other healthy lifestyle behaviors and choices. Follow-up prevention exam in 1 year. Follow-up office visit for chronic conditions.

## 2016-11-21 NOTE — Patient Instructions (Signed)
Thank you for choosing [facility].  SUMMARY AND INSTRUCTIONS:  Please work on increasing physical activity to goal of 30 minutes daily or approximately 10,000 steps.  Decrease saturated fats and processed/sugary foods and increase nutrient dense foods like fruits and vegetables.  Medications have been refilled. We will refill your diabetic medications once we see your A1c.   Medication:  Your prescription(s) have been submitted to your pharmacy or been printed and provided for you. Please take as directed and contact our office if you believe you are having problem(s) with the medication(s) or have any questions.  Labs:  Please stop by the lab on the lower level of the building for your blood work. Your results will be released to [facility] (or called to you) after review, usually within 72 hours after test completion. If any changes need to be made, you will be notified at that same time.  1.) The lab is open from 7:30am to 5:30 pm Monday-Friday 2.) No appointment is necessary 3.) Fasting (if needed) is 6-8 hours after food and drink; black coffee and water are okay   Follow up:  If your symptoms worsen or fail to improve, please contact our office for further instruction, or in case of emergency go directly to the emergency room at the closest medical facility.   Health Maintenance  Topic Date Due  . FOOT EXAM  10/20/1950  . OPHTHALMOLOGY EXAM  [birth year]20/1950  . TETANUS/TDAP  10/21/1958  . PNA vac Low Risk Adult (1 of 2 - PCV13) 10/20/2004  . HEMOGLOBIN A1C  05/22/2016  . INFLUENZA VACCINE  02/26/2017    Health Maintenance, Male A healthy lifestyle and preventive care is important for your health and wellness. Ask your health care provider about what schedule of regular examinations is right for you. What should I know about weight and diet?  Eat a Healthy Diet  Eat plenty of vegetables, fruits, whole grains, low-fat dairy products, and lean protein.  Do not eat a lot  of foods high in solid fats, added sugars, or salt. Maintain a Healthy Weight  Regular exercise can help you achieve or maintain a healthy weight. You should:  Do at least 150 minutes of exercise each week. The exercise should increase your heart rate and make you sweat (moderate-intensity exercise).  Do strength-training exercises at least twice a week. Watch Your Levels of Cholesterol and Blood Lipids  Have your blood tested for lipids and cholesterol every 5 years starting at 77 years of age. If you are at high risk for heart disease, you should start having your blood tested when you are 77 years old. You may need to have your cholesterol levels checked more often if:  Your lipid or cholesterol levels are high.  You are older than 77 years of age.  You are at high risk for heart disease. What should I know about cancer screening? Many types of cancers can be detected early and may often be prevented. Lung Cancer  You should be screened every year for lung cancer if:  You are a current smoker who has smoked for at least 30 years.  You are a former smoker who has quit within the past 15 years.  Talk to your health care provider about your screening options, when you should start screening, and how often you should be screened. Colorectal Cancer  Routine colorectal cancer screening usually begins at 77 years of age and should be repeated every 5-10 years until you are 77 years old. You  may need to be screened more often if early forms of precancerous polyps or small growths are found. Your health care provider may recommend screening at an earlier age if you have risk factors for colon cancer.  Your health care provider may recommend using home test kits to check for hidden blood in the stool.  A small camera at the end of a tube can be used to examine your colon (sigmoidoscopy or colonoscopy). This checks for the earliest forms of colorectal cancer. Prostate and Testicular  Cancer  Depending on your age and overall health, your health care provider may do certain tests to screen for prostate and testicular cancer.  Talk to your health care provider about any symptoms or concerns you have about testicular or prostate cancer. Skin Cancer  Check your skin from head to toe regularly.  Tell your health care provider about any new moles or changes in moles, especially if:  There is a change in a mole's size, shape, or color.  You have a mole that is larger than a pencil eraser.  Always use sunscreen. Apply sunscreen liberally and repeat throughout the day.  Protect yourself by wearing long sleeves, pants, a wide-brimmed hat, and sunglasses when outside. What should I know about heart disease, diabetes, and high blood pressure?  If you are 10-4 years of age, have your blood pressure checked every 3-5 years. If you are 98 years of age or older, have your blood pressure checked every year. You should have your blood pressure measured twice-once when you are at a hospital or clinic, and once when you are not at a hospital or clinic. Record the average of the two measurements. To check your blood pressure when you are not at a hospital or clinic, you can use:  An automated blood pressure machine at a pharmacy.  A home blood pressure monitor.  Talk to your health care provider about your target blood pressure.  If you are between 71-24 years old, ask your health care provider if you should take aspirin to prevent heart disease.  Have regular diabetes screenings by checking your fasting blood sugar level.  If you are at a normal weight and have a low risk for diabetes, have this test once every three years after the age of 57.  If you are overweight and have a high risk for diabetes, consider being tested at a younger age or more often.  A one-time screening for abdominal aortic aneurysm (AAA) by ultrasound is recommended for men aged 65-75 years who are current  or former smokers. What should I know about preventing infection? Hepatitis B  If you have a higher risk for hepatitis B, you should be screened for this virus. Talk with your health care provider to find out if you are at risk for hepatitis B infection. Hepatitis C  Blood testing is recommended for:  Everyone born from [birth year] through 1966.  Anyone with known risk factors for hepatitis C. Sexually Transmitted Diseases (STDs)  You should be screened each year for STDs including gonorrhea and chlamydia if:  You are sexually active and are younger than 77 years of age.  You are older than 77 years of age and your health care provider tells you that you are at risk for this type of infection.  Your sexual activity has changed since you were last screened and you are at an increased risk for chlamydia or gonorrhea. Ask your health care provider if you are at risk.  Talk with  your health care provider about whether you are at high risk of being infected with HIV. Your health care provider may recommend a prescription medicine to help prevent HIV infection. What else can I do?  Schedule regular health, dental, and eye exams.  Stay current with your vaccines (immunizations).  Do not use any tobacco products, such as cigarettes, chewing tobacco, and e-cigarettes. If you need help quitting, ask your health care provider.  Limit alcohol intake to no more than 2 drinks per day. One drink equals 12 ounces of beer, 5 ounces of wine, or 1 ounces of hard liquor.  Do not use street drugs.  Do not share needles.  Ask your health care provider for help if you need support or information about quitting drugs.  Tell your health care provider if you often feel depressed.  Tell your health care provider if you have ever been abused or do not feel safe at home. This information is not intended to replace advice given to you by your health care provider. Make sure you discuss any questions you have  with your health care provider. Document Released: 01/11/2008 Document Revised: 03/13/2016 Document Reviewed: 04/18/2015 Elsevier Interactive Patient Education  2017 [place] Directive Advance directives are legal documents that let you make choices ahead of time about your health care and medical treatment in case you become unable to communicate for yourself. Advance directives are a way for you to communicate your wishes to family, friends, and health care providers. This can help convey your decisions about end-of-life care if you become unable to communicate. Discussing and writing advance directives should happen over time rather than all at once. Advance directives can be changed depending on your situation and what you want, even after you have signed the advance directives. If you do not have an advance directive, some states assign family decision makers to act on your behalf based on how closely you are related to them. Each state has its own laws regarding advance directives. You may want to check with your health care provider, attorney, or state representative about the laws in your state. There are different types of advance directives, such as: Medical power of attorney. Living will. Do not resuscitate (DNR) or do not attempt resuscitation (DNAR) order. Health care proxy and medical power of attorney A health care proxy, also called a health care agent, is a person who is appointed to make medical decisions for you in cases in which you are unable to make the decisions yourself. Generally, people choose someone they know well and trust to represent their preferences. Make sure to ask this person for an agreement to act as your proxy. A proxy may have to exercise judgment in the event of a medical decision for which your wishes are not known. A medical power of attorney is a legal document that names your health care proxy. Depending on the laws in your state, after the  document is written, it may also need to be: Signed. Notarized. Dated. Copied. Witnessed. Incorporated into your medical record. You may also want to appoint someone to manage your financial affairs in a situation in which you are unable to do so. This is called a durable power of attorney for finances. It is a separate legal document from the durable power of attorney for health care. You may choose the same person or someone different from your health care proxy to act as your agent in financial matters. If you do  not appoint a proxy, or if there is a concern that the proxy is not acting in your best interests, a court-appointed guardian may be designated to act on your behalf. Living will A living will is a set of instructions documenting your wishes about medical care when you cannot express them yourself. Health care providers should keep a copy of your living will in your medical record. You may want to give a copy to family members or friends. To alert caregivers in case of an emergency, you can place a card in your wallet to let them know that you have a living will and where they can find it. A living will is used if you become: Terminally ill. Incapacitated. Unable to communicate or make decisions. Items to consider in your living will include: The use or non-use of life-sustaining equipment, such as dialysis machines and breathing machines (ventilators). A DNR or DNAR order, which is the instruction not to use cardiopulmonary resuscitation (CPR) if breathing or heartbeat stops. The use or non-use of tube feeding. Withholding of food and fluids. Comfort (palliative) care when the goal becomes comfort rather than a cure. Organ and tissue donation. A living will does not give instructions for distributing your money and property if you should pass away. It is recommended that you seek the advice of a lawyer when writing a will. Decisions about taxes, beneficiaries, and asset distribution  will be legally binding. This process can relieve your family and friends of any concerns surrounding disputes or questions that may come up about the distribution of your assets. DNR or DNAR A DNR or DNAR order is a request not to have CPR in the event that your heart stops beating or you stop breathing. If a DNR or DNAR order has not been made and shared, a health care provider will try to help any patient whose heart has stopped or who has stopped breathing. If you plan to have surgery, talk with your health care provider about how your DNR or DNAR order will be followed if problems occur. Summary Advance directives are the legal documents that allow you to make choices ahead of time about your health care and medical treatment in case you become unable to communicate for yourself. The process of discussing and writing advance directives should happen over time. You can change the advance directives, even after you have signed them. Advance directives include DNR or DNAR orders, living wills, and designating an agent as your medical power of attorney. This information is not intended to replace advice given to you by your health care provider. Make sure you discuss any questions you have with your health care provider. Document Released: 10/22/2007 Document Revised: 06/03/2016 Document Reviewed: 06/03/2016 Elsevier Interactive Patient Education  2017 [employer].

## 2016-11-21 NOTE — Assessment & Plan Note (Signed)
Stable with no current symptoms and monitored through risk factor adjustment. Goal LDL of less than 70 and cold blood pressure less than 140/90. Continue to monitor.

## 2016-11-21 NOTE — Assessment & Plan Note (Signed)
Blood pressure appears adequately controlled current medication regimen and no adverse side effects. Denies worst headache of life with no symptoms of end organ damage noted on physical exam. Continue current dosage of ramipril and carvedilol. Encouraged to monitor blood pressure at home and follow sodium diet.

## 2016-11-21 NOTE — Assessment & Plan Note (Signed)
BMI of 32. Has gained significant amount of weight since previous office visit. Recommend weight loss of 5-10% of current body weight. Recommend increasing physical activity to 30 minutes of moderate level activity daily. Encourage nutritional intake that focuses on nutrient dense foods and is moderate, varied, and balanced and is low in saturated fats and processed/sugary foods. Continue to monitor.

## 2016-11-21 NOTE — Assessment & Plan Note (Signed)
Obtain hemoglobin A1c and urine microalbumin. Diabetic foot exam completed today. Maintained on ramipril atorvastatin for CAD risk reduction. Declines Pneumovax. Due for diabetic eye exam encouraged to be completed independently. Continue monitoring blood sugars at home. Changes pending A1c results.

## 2016-11-21 NOTE — Progress Notes (Signed)
Subjective:    Patient ID: Scott Conley, male    DOB: 01-15-1941, 76 y.o.   MRN: [id number]  Chief Complaint  Patient presents with  . CPE    fasting, refill on all meds    HPI:  Scott Conley is a 76 y.o. male who presents today for a Medicare Annual Wellness/Physical exam.    1) Health Maintenance -   Diet - Averaging about 2-3 meals per day consisting of a regular diet; Caffeine 2-3 cups of coffee every now and then.   Exercise - No structured exercise   Wt Readings from Last 3 Encounters:  11/21/16 265 lb 12.8 oz (120.6 kg)  11/21/15 248 lb (112.5 kg)  03/01/14 240 lb (108.9 kg)     2) Preventative Exams / Immunizations:  Dental -- Due for exam   Vision -- Due for exam    Health Maintenance  Topic Date Due  . FOOT EXAM  10/20/1949  . OPHTHALMOLOGY EXAM  10/20/1949  . TETANUS/TDAP  10/21/1958  . PNA vac Low Risk Adult (1 of 2 - PCV13) 10/20/2004  . HEMOGLOBIN A1C  05/22/2016  . INFLUENZA VACCINE  02/26/2017      There is no immunization history on file for this patient.  RISK FACTORS  Tobacco History  Smoking Status  . Former Smoker  . Packs/day: 0.50  . Years: 58.00  . Types: Cigarettes  . Start date: 11/30/2013  Smokeless Tobacco  . Never Used     Cardiac risk factors: advanced age (older than 83 for men, 92 for women), diabetes mellitus, hypertension, male gender, obesity (BMI >= 30 kg/m2) and sedentary lifestyle.  Depression Screen  Depression screen [facility] 2/9 11/21/2016  Decreased Interest 0  Down, Depressed, Hopeless 0  PHQ - 2 Score 0     Activities of Daily Living In your present state of health, do you have any difficulty performing the following activities?:  Driving? No Managing money?  No Feeding yourself? No Getting from bed to chair? No Climbing a flight of stairs? No Preparing food and eating?: No Bathing or showering? No Getting dressed: No Getting to the toilet? No Using the toilet: No Moving around from place  to place: No In the past year have you fallen or had a near fall?:No   Home Safety Has smoke detector and wears seat belts. No firearms. No excess sun exposure. Are there smokers in your home (other than you)?  No Do you feel safe at home?  Yes  Hearing Difficulties: No Do you often ask people to speak up or repeat themselves? No Do you experience ringing or noises in your ears? No  Do you have difficulty understanding soft or whispered voices? No    Cognitive Testing  Alert? Yes   Normal Appearance? Yes  Oriented to person? Yes  Place? Yes   Time? Yes  Recall of three objects?  Yes  Can perform simple calculations? Yes  Displays appropriate judgment? Yes  Can read the correct time from a watch face? Yes  Do you feel that you have a problem with memory? No  Do you often misplace items? No   Advanced Directives have been discussed with the patient? Yes   Current Physicians/Providers and Suppliers  1. Terri Piedra, FNP - Internal Medicine  Indicate any recent Medical Services you may have received from other than [facility] providers in the past year (date may be approximate).  All answers were reviewed with the patient and necessary referrals were made:  Mauricio Po, [place]   11/21/2016    No Known Allergies   Outpatient Medications Prior to Visit  Medication Sig Dispense Refill  . aspirin 325 MG tablet Take 1 tablet (325 mg total) by mouth daily. New blood thinner to prevent strokes 30 tablet   . senna-docusate (SENOKOT-S) 8.6-50 MG per tablet Take 1 tablet by mouth at bedtime. For constipation 90 tablet 1  . sitaGLIPtin (JANUVIA) 50 MG tablet Take 1 tablet (50 mg total) by mouth daily. Due for yearly physical w/labs in April must see MD for refills 30 tablet 0  . atorvastatin (LIPITOR) 80 MG tablet Take 1 tablet (80 mg total) by mouth daily. Due for yearly physical w/labs in April must see MD for refills 30 tablet 0  . carvedilol (COREG) 6.25 MG tablet Take 1 tablet (6.25 mg  total) by mouth 2 (two) times daily with a meal. 60 tablet 5  . JANUVIA 50 MG tablet Take 1 tablet by mouth daily.    . ramipril (ALTACE) 2.5 MG capsule Take 1 capsule (2.5 mg total) by mouth daily. 90 capsule 1  . diphenhydrAMINE (BENADRYL) 25 mg capsule Take 25 mg by mouth at bedtime as needed for sleep.     No facility-administered medications prior to visit.      Past Medical History:  Diagnosis Date  . Arthritis   . Blood clot in vein   . CVA (cerebral infarction)   . Diabetes ([place])   . Hypertension   . Stroke [facility])    Left hand stiffness      Past Surgical History:  Procedure Laterality Date  . CYST REMOVAL LEG     removed from right groin  . LOOP RECORDER IMPLANT  11/29/13   MDT LinQ implanted by Dr Caryl Comes for cryptogenic stroke  . LOOP RECORDER IMPLANT N/A 11/29/2013   Procedure: LOOP RECORDER IMPLANT;  Surgeon: Deboraha Sprang, MD;  Location: [facility] CATH LAB;  Service: Cardiovascular;  Laterality: N/A;  . TEE WITHOUT CARDIOVERSION N/A 11/29/2013   Procedure: TRANSESOPHAGEAL ECHOCARDIOGRAM (TEE);  Surgeon: Sueanne Margarita, MD;  Location: [facility] ENDOSCOPY;  Service: Cardiovascular;  Laterality: N/A;     Family History  Problem Relation Age of Onset  . Breast cancer Mother   . Breast cancer Maternal Grandmother      Social History   Social History  . Marital status: Widowed    Spouse name: N/A  . Number of children: 4  . Years of education: 14   Occupational History  . Retired    Social History Main Topics  . Smoking status: Former Smoker    Packs/day: 0.50    Years: 58.00    Types: Cigarettes    Start date: 11/30/2013  . Smokeless tobacco: Never Used  . Alcohol use No     Comment: none since 1985  . Drug use: No  . Sexual activity: Not on file   Other Topics Concern  . Not on file   Social History Narrative   Fun: playing billiards   Denies abuse and feels safe at home.      Review of Systems  Constitutional: Denies fever, chills, fatigue, or significant  weight gain/loss. HENT: Head: Denies headache or neck pain Ears: Denies changes in hearing, ringing in ears, earache, drainage Nose: Denies discharge, stuffiness, itching, nosebleed, sinus pain Throat: Denies sore throat, hoarseness, dry mouth, sores, thrush Eyes: Denies loss/changes in vision, pain, redness, blurry/double vision, flashing lights Cardiovascular: Denies chest pain/discomfort, tightness, palpitations, shortness of breath with activity,  difficulty lying down, swelling, sudden awakening with shortness of breath Respiratory: Denies shortness of breath, cough, sputum production, wheezing Gastrointestinal: Denies dysphasia, heartburn, change in appetite, nausea, change in bowel habits, rectal bleeding, constipation, diarrhea, yellow skin or eyes Genitourinary: Denies frequency, urgency, burning/pain, blood in urine, incontinence, change in urinary strength. Musculoskeletal: Denies muscle/joint pain, stiffness, back pain, redness or swelling of joints, trauma Skin: Denies rashes, lumps, itching, dryness, color changes, or hair/nail changes Neurological: Denies dizziness, fainting, seizures, weakness, numbness, tingling, tremor Psychiatric - Denies nervousness, stress, depression or memory loss Endocrine: Denies heat or cold intolerance, sweating, frequent urination, excessive thirst, changes in appetite Hematologic: Denies ease of bruising or bleeding    Objective:     BP 124/70 (BP Location: Left Arm, Patient Position: Sitting, Cuff Size: Large)   Pulse 75   Temp 97.9 F (36.6 C) (Oral)   Resp 16   Ht '6\' 4"'$  (1.93 m)   Wt 265 lb 12.8 oz (120.6 kg)   SpO2 96%   BMI 32.35 kg/m  Nursing note and vital signs reviewed.  Physical Exam  Constitutional: He is oriented to person, place, and time. He appears well-developed and well-nourished. No distress.  Cardiovascular: Normal rate, regular rhythm, normal heart sounds and intact distal pulses.   Pulmonary/Chest: Effort normal and  breath sounds normal.  Neurological: He is alert and oriented to person, place, and time.  Diabetic foot exam - bilateral feet are free from skin breakdown, cuts, and abrasions. Toenails are neatly trimmed. Pulses are intact and appropriate. Sensation is intact to monofilament bilaterally.  Skin: Skin is warm and dry.  Psychiatric: He has a normal mood and affect. His behavior is normal. Judgment and thought content normal.       Assessment & Plan:   During the course of the visit the patient was educated and counseled about appropriate screening and preventive services including:    Pneumococcal vaccine   Influenza vaccine  Prostate cancer screening  Colorectal cancer screening  Diabetes screening  Nutrition counseling   Diet review for nutrition referral? Yes ____  Not Indicated _X___   Patient Instructions (the written plan) was given to the patient.  Medicare Attestation I have personally reviewed: The patient's medical and social history Their use of alcohol, tobacco or illicit drugs Their current medications and supplements The patient's functional ability including ADLs,fall risks, home safety risks, cognitive, and hearing and visual impairment Diet and physical activities Evidence for depression or mood disorders  The patient's weight, height, BMI,  have been recorded in the chart.  I have made referrals, counseling, and provided education to the patient based on review of the above and I have provided the patient with a written personalized care plan for preventive services.     Problem List Items Addressed This Visit      Cardiovascular and Mediastinum   Stroke ([place])    Stable with no current symptoms and monitored through risk factor adjustment. Goal LDL of less than 70 and cold blood pressure less than 140/90. Continue to monitor.      Relevant Medications   atorvastatin (LIPITOR) 80 MG tablet   ramipril (ALTACE) 2.5 MG capsule   carvedilol (COREG) 6.25  MG tablet   Other Relevant Orders   CBC (Completed)   Comprehensive metabolic panel (Completed)   Lipid panel (Completed)   Hemoglobin A1c (Completed)   Essential hypertension    Blood pressure appears adequately controlled current medication regimen and no adverse side effects. Denies worst headache of life  with no symptoms of end organ damage noted on physical exam. Continue current dosage of ramipril and carvedilol. Encouraged to monitor blood pressure at home and follow sodium diet.      Relevant Medications   atorvastatin (LIPITOR) 80 MG tablet   ramipril (ALTACE) 2.5 MG capsule   carvedilol (COREG) 6.25 MG tablet   Other Relevant Orders   CBC (Completed)   Comprehensive metabolic panel (Completed)   Lipid panel (Completed)     Endocrine   Type 2 diabetes mellitus (HCC)    Obtain hemoglobin A1c and urine microalbumin. Diabetic foot exam completed today. Maintained on ramipril atorvastatin for CAD risk reduction. Declines Pneumovax. Due for diabetic eye exam encouraged to be completed independently. Continue monitoring blood sugars at home. Changes pending A1c results.      Relevant Medications   atorvastatin (LIPITOR) 80 MG tablet   ramipril (ALTACE) 2.5 MG capsule   Other Relevant Orders   Hemoglobin A1c (Completed)   Urine Microalbumin w/creat. ratio (Completed)     Other   Medicare annual wellness visit, subsequent - Primary    Reviewed and updated patient's medical, surgical, family and social history. Medications and allergies were also reviewed. Basic screenings for depression, activities of daily living, hearing, cognition and safety were performed. Provider list was updated and health plan was provided to the patient.   Declines Prevnar, Pneumovax, and tetanus. All other immunizations are up-to-date per recommendations. Due for dental and vision exam encouraged to be completed independently. Obtain PSA for prostate cancer screening. All other screenings are up-to-date  per recommendations.  Overall well exam with risk factors for cardiovascular disease including obesity, previous stroke, type 2 diabetes, and hypertension. Recommend weight loss of 5-10% of current body weight through nutrition and physical activity changes. Goal is 30 minutes of moderate level activity daily or approximately 10,000 steps per day. He does not smoke anymore. Encourage nutritional intake that is moderate, balance, and varied. Continue other healthy lifestyle behaviors and choices. Follow-up prevention exam in 1 year. Follow-up office visit for chronic conditions.      Obesity    BMI of 32. Has gained significant amount of weight since previous office visit. Recommend weight loss of 5-10% of current body weight. Recommend increasing physical activity to 30 minutes of moderate level activity daily. Encourage nutritional intake that focuses on nutrient dense foods and is moderate, varied, and balanced and is low in saturated fats and processed/sugary foods. Continue to monitor.         Other Visit Diagnoses    Screening for prostate cancer       Relevant Orders   PSA, Medicare       I have discontinued Mr. Briley's diphenhydrAMINE and JANUVIA. I am also having him maintain his aspirin, senna-docusate, sitaGLIPtin, atorvastatin, ramipril, and carvedilol.   Meds ordered this encounter  Medications  . atorvastatin (LIPITOR) 80 MG tablet    Sig: Take 1 tablet (80 mg total) by mouth daily. Due for yearly physical w/labs in April must see MD for refills    Dispense:  90 tablet    Refill:  1    Order Specific Question:   Supervising Provider    Answer:   Pricilla Holm A [[id number]]  . ramipril (ALTACE) 2.5 MG capsule    Sig: Take 1 capsule (2.5 mg total) by mouth daily.    Dispense:  90 capsule    Refill:  1    Order Specific Question:   Supervising Provider    Answer:  [place], [place] [[id number]]  . carvedilol (COREG) 6.25 MG tablet    Sig: Take 1 tablet (6.25 mg total)  by mouth 2 (two) times daily with a meal.    Dispense:  60 tablet    Refill:  5    Order Specific Question:   Supervising Provider    Answer:   Pricilla Holm A [[id number]]     Follow-up: Return in about 3 months (around 02/20/2017), or if symptoms worsen or fail to improve.   Mauricio Po, FNP

## 2016-11-29 ENCOUNTER — Ambulatory Visit (INDEPENDENT_AMBULATORY_CARE_PROVIDER_SITE_OTHER): Payer: Medicare Other | Admitting: *Deleted

## 2016-11-29 DIAGNOSIS — I639 Cerebral infarction, unspecified: Secondary | ICD-10-CM | POA: Diagnosis not present

## 2016-12-02 NOTE — Progress Notes (Signed)
Carelink Summary Report / Loop Recorder 

## 2016-12-12 LAB — CUP PACEART REMOTE DEVICE CHECK
Date Time Interrogation Session: 20180505021014
MDC IDC PG IMPLANT DT: 20150504

## 2016-12-30 ENCOUNTER — Ambulatory Visit (INDEPENDENT_AMBULATORY_CARE_PROVIDER_SITE_OTHER): Payer: Medicare Other | Admitting: *Deleted

## 2016-12-30 DIAGNOSIS — I639 Cerebral infarction, unspecified: Secondary | ICD-10-CM

## 2016-12-30 NOTE — Progress Notes (Signed)
Carelink Summary Report / Loop Recorder 

## 2017-01-01 LAB — CUP PACEART REMOTE DEVICE CHECK
Date Time Interrogation Session: 20180604020913
Implantable Pulse Generator Implant Date: 20150504

## 2017-01-13 ENCOUNTER — Other Ambulatory Visit: Payer: Self-pay

## 2017-01-13 MED ORDER — SITAGLIPTIN PHOSPHATE 100 MG PO TABS: 100.0000 mg | ORAL_TABLET | Freq: Every day | ORAL | 1 refills | Status: DC

## 2017-01-28 ENCOUNTER — Ambulatory Visit (INDEPENDENT_AMBULATORY_CARE_PROVIDER_SITE_OTHER): Payer: Medicare Other | Admitting: *Deleted

## 2017-01-28 DIAGNOSIS — I639 Cerebral infarction, unspecified: Secondary | ICD-10-CM | POA: Diagnosis not present

## 2017-01-30 NOTE — Progress Notes (Signed)
Carelink Summary Report / Loop Recorder 

## 2017-02-06 LAB — CUP PACEART REMOTE DEVICE CHECK
Implantable Pulse Generator Implant Date: 20150504
MDC IDC SESS DTM: 20180704021701

## 2017-02-27 ENCOUNTER — Ambulatory Visit (INDEPENDENT_AMBULATORY_CARE_PROVIDER_SITE_OTHER): Payer: Medicare Other | Admitting: *Deleted

## 2017-02-27 DIAGNOSIS — I639 Cerebral infarction, unspecified: Secondary | ICD-10-CM

## 2017-02-28 NOTE — Progress Notes (Signed)
Carelink Summary Report / Loop Recorder 

## 2017-03-11 LAB — CUP PACEART REMOTE DEVICE CHECK
MDC IDC PG IMPLANT DT: 20150504
MDC IDC SESS DTM: 20180803023903

## 2017-03-24 ENCOUNTER — Telehealth: Payer: Self-pay | Admitting: Family

## 2017-03-24 MED ORDER — SITAGLIPTIN PHOSPHATE 100 MG PO TABS: 100.0000 mg | ORAL_TABLET | Freq: Every day | ORAL | 0 refills | Status: DC

## 2017-03-24 NOTE — Telephone Encounter (Signed)
sitaGLIPtin (JANUVIA) 100 MG tablet   Patient is requesting a refill on this medication. Please advise.   [place].

## 2017-03-24 NOTE — Telephone Encounter (Signed)
Reviewed pt chart sent refill to [facility]...Scott Conley

## 2017-04-01 ENCOUNTER — Ambulatory Visit (INDEPENDENT_AMBULATORY_CARE_PROVIDER_SITE_OTHER): Payer: Medicare Other | Admitting: *Deleted

## 2017-04-01 DIAGNOSIS — I639 Cerebral infarction, unspecified: Secondary | ICD-10-CM | POA: Diagnosis not present

## 2017-04-03 NOTE — Progress Notes (Signed)
Carelink Summary Report / Loop Recorder 

## 2017-04-06 LAB — CUP PACEART REMOTE DEVICE CHECK
Date Time Interrogation Session: 20180902034054
MDC IDC PG IMPLANT DT: 20150504

## 2017-04-28 ENCOUNTER — Ambulatory Visit (INDEPENDENT_AMBULATORY_CARE_PROVIDER_SITE_OTHER): Payer: Medicare Other | Admitting: *Deleted

## 2017-04-28 DIAGNOSIS — I639 Cerebral infarction, unspecified: Secondary | ICD-10-CM

## 2017-04-29 NOTE — Progress Notes (Signed)
Loop recorder summary report

## 2017-05-01 LAB — CUP PACEART REMOTE DEVICE CHECK
Implantable Pulse Generator Implant Date: 20150504
MDC IDC SESS DTM: 20181002041200

## 2017-05-23 ENCOUNTER — Telehealth: Payer: Self-pay | Admitting: *Deleted

## 2017-05-23 NOTE — Telephone Encounter (Signed)
Spoke with patient regarding LINQ at [facility] as of 05/12/17.  Reviewed various options with patient and offered appointment with MD or PA to discuss explant.  Patient states he would prefer to just leave the [facility] in place.  He verbalizes understanding that it will no longer monitor his heart rhythm.  Patient agrees to call our office if he decides he wants to have it explanted at a later date.  Patient will plan to unplug his home monitor.  Unenrolled from [place], return kit ordered to confirmed address.  Patient denies additional questions or concerns at this time.

## 2017-06-23 ENCOUNTER — Telehealth: Payer: Self-pay | Admitting: Internal Medicine

## 2017-06-23 DIAGNOSIS — I10 Essential (primary) hypertension: Secondary | ICD-10-CM

## 2017-06-23 MED ORDER — SITAGLIPTIN PHOSPHATE 100 MG PO TABS: 100.0000 mg | ORAL_TABLET | Freq: Every day | ORAL | 1 refills | Status: DC

## 2017-06-23 MED ORDER — RAMIPRIL 2.5 MG PO CAPS: 2.5000 mg | ORAL_CAPSULE | Freq: Every day | ORAL | 1 refills | Status: DC

## 2017-06-23 MED ORDER — ATORVASTATIN CALCIUM 80 MG PO TABS: 80.0000 mg | ORAL_TABLET | Freq: Every day | ORAL | 1 refills | Status: DC

## 2017-06-23 NOTE — Telephone Encounter (Signed)
Per office policy sent 30 day to local pharmacy until appt.../lmb  

## 2017-06-23 NOTE — Telephone Encounter (Signed)
Patient has appt to transfer to [facility] in Feb.  Patient is requesting refills on januvia, ramipril and atorvastatin to be sent to [facility] at [place].

## 2017-07-16 ENCOUNTER — Other Ambulatory Visit: Payer: Self-pay | Admitting: Internal Medicine

## 2017-08-21 ENCOUNTER — Other Ambulatory Visit: Payer: Self-pay | Admitting: Internal Medicine

## 2017-08-21 DIAGNOSIS — I10 Essential (primary) hypertension: Secondary | ICD-10-CM

## 2017-09-01 ENCOUNTER — Ambulatory Visit: Payer: Medicare Other | Admitting: Internal Medicine

## 2017-10-01 ENCOUNTER — Ambulatory Visit: Payer: Medicare Other | Admitting: Internal Medicine

## 2017-11-05 ENCOUNTER — Ambulatory Visit: Payer: Medicare Other | Admitting: Internal Medicine

## 2018-01-19 ENCOUNTER — Ambulatory Visit: Admit: 2018-01-19 | Discharge: 2018-01-19 | Payer: MEDICARE | Attending: Medical | Primary: Family Medicine

## 2018-01-19 DIAGNOSIS — I10 Essential (primary) hypertension: Secondary | ICD-10-CM

## 2018-01-19 MED ORDER — AMLODIPINE-BENAZEPRIL 2.5 MG-10 MG CAP
ORAL_CAPSULE | Freq: Every day | ORAL | 5 refills | Status: DC
Start: 2018-01-19 — End: 2018-06-19

## 2018-01-19 NOTE — ACP (Advance Care Planning) (Signed)
No documents on file.

## 2018-01-19 NOTE — Progress Notes (Signed)
[facility]  [address] [place] [address]  Phone [phone]  Fax:  [phone]    Patient: Matthew Sherman  Date of Birth: 06-12-1941  Age 77 y.o.  Sex male  MEDICAL RECORD NUMBER [id number]  Visit Date: 01/19/18  Author:  Sueanne Margarita, PA-C    Family Practice Clinic Note    Chief Complaint   Patient presents with   ??? New Patient   ??? Blood Pressure Check   ??? Other     Pt is having pain in his legs, it just started a few weeks ago        History of Present Illness  This is a 77 year old male, new patient to the office, who presents today to establish care.  He reports a past medical history that includes hypertension but states that he has never been on medication for this.  Notes that he checks his blood pressure at home and is typically in the 170s/80s to 90s.  He denies headaches, vision change, shortness of breath, chest pain or palpitations.  No lower extremity edema.  He denies any other significant past medical history but admits that he has not been to see a physician in 12 or 13 years.  He believes it is been at least that long, possibly longer, since he has had lab work done.    Patient felt that he should become established because he has been experiencing some pains in the back of his legs for the past 3 weeks.  He notes achy pain in the muscles of the back of his legs which seems to worsen with ambulation.  He does not have any discomfort when he is sitting or lying down.  He also notes a "cold" sensation of his feet during this time as well.  Denies any numbness or tingling.  Denies weakness of his extremities.  Denies back pain.  No history of similar symptoms.    Past History:    Past Medical history   Past Medical History:   Diagnosis Date   ??? Hypertension        Current Problem List:   Patient Active Problem List   Diagnosis Code   ??? Hypertension I10       Current Medications:   Current Outpatient Medications   Medication Sig Dispense    ??? amLODIPine-benazepril (LOTREL) 2.5-10 mg per capsule Take 1 Cap by mouth daily. 30 Cap     No current facility-administered medications for this visit.        Allergies:No Known Allergies    Surgical History:  Past Surgical History:   Procedure Laterality Date   ??? HX APPENDECTOMY  1956   ??? HX CATARACT REMOVAL Bilateral     right done 2018, left 2002       Social History:   Social History     Social History Narrative   ??? Not on file      Social History     Socioeconomic History   ??? Marital status: MARRIED     Spouse name: Not on file   ??? Number of children: Not on file   ??? Years of education: Not on file   ??? Highest education level: Not on file   Occupational History   ??? Not on file   Social Needs   ??? Financial resource strain: Not on file   ??? Food insecurity:     Worry: Not on file     Inability: Not on file   ???  Transportation needs:     Medical: Not on file     Non-medical: Not on file   Tobacco Use   ??? Smoking status: Never Smoker   ??? Smokeless tobacco: Never Used   Substance and Sexual Activity   ??? Alcohol use: Never     Frequency: Never   ??? Drug use: Never   ??? Sexual activity: Not on file   Lifestyle   ??? Physical activity:     Days per week: Not on file     Minutes per session: Not on file   ??? Stress: Not on file   Relationships   ??? Social connections:     Talks on phone: Not on file     Gets together: Not on file     Attends religious service: Not on file     Active member of club or organization: Not on file     Attends meetings of clubs or organizations: Not on file     Relationship status: Not on file   ??? Intimate partner violence:     Fear of current or ex partner: Not on file     Emotionally abused: Not on file     Physically abused: Not on file     Forced sexual activity: Not on file   Other Topics Concern   ??? Not on file   Social History Narrative   ??? Not on file         ROS  Review of Systems   Constitutional: Negative for chills, fatigue and fever.   HENT: Negative for congestion.     Eyes: Negative for visual disturbance.   Respiratory: Negative for cough, chest tightness and shortness of breath.    Cardiovascular: Negative for chest pain, palpitations and leg swelling.   Gastrointestinal: Negative for blood in stool, constipation, diarrhea, nausea and vomiting.   Endocrine: Negative for cold intolerance, heat intolerance, polydipsia and polyuria.   Genitourinary: Negative for difficulty urinating.   Musculoskeletal: Positive for myalgias.   Skin: Negative for rash.   Neurological: Negative for dizziness, weakness, light-headedness, numbness and headaches.   Hematological: Does not bruise/bleed easily.         Visit Vitals  BP (!) 174/92 (BP 1 Location: Left arm, BP Patient Position: Sitting)   Pulse 69   Temp 98.2 ??F (36.8 ??C) (Oral)   Resp 13   Ht 6' (1.829 m)   Wt 215 lb 9.6 oz (97.8 kg)   SpO2 97%   BMI 29.24 kg/m??     Body mass index is 29.24 kg/m??.     Physical Exam    Physical Exam   Constitutional: He is oriented to person, place, and time. He appears well-developed and well-nourished.   Elderly gentleman in no apparent distress   HENT:   Head: Normocephalic and atraumatic.   Eyes: Conjunctivae are normal.   Neck: Neck supple. No thyromegaly present.   Cardiovascular: Normal rate, regular rhythm and normal heart sounds.   No murmur heard.  Palpable but slightly decreased pulses DP/PT bilateral   Pulmonary/Chest: Effort normal and breath sounds normal.   Musculoskeletal: Normal range of motion.   Lymphadenopathy:     He has no cervical adenopathy.   Neurological: He is alert and oriented to person, place, and time.   Psychiatric: He has a normal mood and affect.       ASSESSMENT & PLAN  Encounter Diagnoses     ICD-10-CM ICD-9-CM   1. Essential hypertension I10 401.9  2. Claudication of lower extremity (HCC) I73.9 443.9       1. Essential hypertension  *Blood pressure is elevated today with goal at < 140/90. Limit your sodium  intake to less than 2 grams daily. A weight loss of 5-10 pounds can also be helpful. Avoid excessive alcohol and caffeine. Start the Lotrel, one a day. Check your blood pressure 3-4 times a week or more frequently if any problems, record them, and bring them in for [place] to review at your next visit. Notify the office if persistently > 160/100 on more than 3 separate occasions and/or if you develop headaches, dizziness, lightheadedness, chest pain, shortness of breath, palpitations, vision changes, speech abnormalities or any other problems.  *We will obtain some fasting labs prior to next visit for review.    - amLODIPine-benazepril (LOTREL) 2.5-10 mg per capsule; Take 1 Cap by mouth daily.  Dispense: 30 Cap; Refill: 5  - CBC WITH AUTOMATED DIFF; Future  - LIPID PANEL; Future  - METABOLIC PANEL, COMPREHENSIVE; Future  - TSH 3RD GENERATION; Future    2. Claudication of lower extremity ([place])  *We will arrange referral for lower extremity arterial Doppler study for evaluation.    - DUPLEX LOWER EXT ARTERY BILAT; Future  - CBC WITH AUTOMATED DIFF; Future  - LIPID PANEL; Future  - METABOLIC PANEL, COMPREHENSIVE; Future  - TSH 3RD GENERATION; Future      Orders Placed This Encounter   ??? DUPLEX LOWER EXT ARTERY BILAT     Please evaluate for claudication of bilat lower extremities     Standing Status:   Future     Standing Expiration Date:   02/19/2019   ??? CBC WITH AUTOMATED DIFF     Standing Status:   Future     Standing Expiration Date:   07/21/2018   ??? LIPID PANEL     Standing Status:   Future     Standing Expiration Date:   67/61/9509   ??? METABOLIC PANEL, COMPREHENSIVE     Standing Status:   Future     Standing Expiration Date:   07/21/2018   ??? TSH, 3RD GENERATION     Standing Status:   Future     Standing Expiration Date:   07/21/2018   ??? amLODIPine-benazepril (LOTREL) 2.5-10 mg per capsule     Sig: Take 1 Cap by mouth daily.     Dispense:  30 Cap     Refill:  5      I have reviewed the patient's past medical history, social history and family history and vitals.  We have discussed treatment plan and follow up and given patient instructions.  Patient's questions are answered and we will follow up as indicated.    Dictated using voice recognition software.  Proof read but unrecognized errors may exist.      Follow-up and Dispositions    ?? Return in about 3 weeks (around 02/09/2018) for f/u lower ext art doppler, HTN, labs.         Sueanne Margarita, PA-C

## 2018-01-19 NOTE — Patient Instructions (Signed)
*  We will arrange for the ultrasound of your legs for further evaluation.  You will be called to schedule the appointment.  *Blood pressure is elevated today with goal at < 140/90. Limit your sodium intake to less than 2 grams daily. A weight loss of 5-10 pounds can also be helpful. Avoid excessive alcohol and caffeine. Start the Lotrel, one a day. Check your blood pressure 3-4 times a week or more frequently if any problems, record them, and bring them in for [place] to review at your next visit. Notify the office if persistently > 160/100 on more than 3 separate occasions and/or if you develop headaches, dizziness, lightheadedness, chest pain, shortness of breath, palpitations, vision changes, speech abnormalities or any other problems.

## 2018-01-21 ENCOUNTER — Inpatient Hospital Stay: Admit: 2018-01-21 | Payer: MEDICARE | Attending: Medical | Primary: Family Medicine

## 2018-01-21 DIAGNOSIS — I739 Peripheral vascular disease, unspecified: Secondary | ICD-10-CM

## 2018-01-22 ENCOUNTER — Other Ambulatory Visit: Payer: Self-pay | Admitting: Family

## 2018-01-22 NOTE — Progress Notes (Signed)
Please let patient know that his arterial doppler study was normal. Keep scheduled follow up appointment

## 2018-01-23 ENCOUNTER — Encounter: Payer: MEDICARE | Attending: Medical | Primary: Family Medicine

## 2018-02-03 ENCOUNTER — Other Ambulatory Visit: Admit: 2018-02-03 | Payer: MEDICARE | Primary: Family Medicine

## 2018-02-03 DIAGNOSIS — I10 Essential (primary) hypertension: Secondary | ICD-10-CM

## 2018-02-03 NOTE — Progress Notes (Signed)
appt 02/10/18 - labs essentially normal

## 2018-02-04 LAB — CBC WITH AUTOMATED DIFF
ABS. BASOPHILS: 0 10*3/uL (ref 0.0–0.2)
ABS. EOSINOPHILS: 0.1 10*3/uL (ref 0.0–0.4)
ABS. IMM. GRANS.: 0 10*3/uL (ref 0.0–0.1)
ABS. MONOCYTES: 0.7 10*3/uL (ref 0.1–0.9)
ABS. NEUTROPHILS: 5 10*3/uL (ref 1.4–7.0)
Abs Lymphocytes: 1.7 10*3/uL (ref 0.7–3.1)
BASOPHILS: 0 %
EOSINOPHILS: 1 %
HCT: 49.2 % (ref 37.5–51.0)
HGB: 16.7 g/dL (ref 13.0–17.7)
IMMATURE GRANULOCYTES: 0 %
Lymphocytes: 22 %
MCH: 28.6 pg (ref 26.6–33.0)
MCHC: 33.9 g/dL (ref 31.5–35.7)
MCV: 84 fL (ref 79–97)
MONOCYTES: 9 %
NEUTROPHILS: 68 %
PLATELET: 192 10*3/uL (ref 150–450)
RBC: 5.84 x10E6/uL — ABNORMAL HIGH (ref 4.14–5.80)
RDW: 14.6 % (ref 12.3–15.4)
WBC: 7.5 10*3/uL (ref 3.4–10.8)

## 2018-02-04 LAB — METABOLIC PANEL, COMPREHENSIVE
A-G Ratio: 2 (ref 1.2–2.2)
ALT (SGPT): 24 IU/L (ref 0–44)
AST (SGOT): 19 IU/L (ref 0–40)
Albumin: 4.2 g/dL (ref 3.5–4.8)
Alk. phosphatase: 61 IU/L (ref 39–117)
BUN/Creatinine ratio: 14 (ref 10–24)
BUN: 13 mg/dL (ref 8–27)
Bilirubin, total: 0.6 mg/dL (ref 0.0–1.2)
CO2: 22 mmol/L (ref 20–29)
Calcium: 9.2 mg/dL (ref 8.6–10.2)
Chloride: 104 mmol/L (ref 96–106)
Creatinine: 0.91 mg/dL (ref 0.76–1.27)
GFR est AA: 93 mL/min/{1.73_m2} (ref >59)
GFR est non-AA: 80 mL/min/{1.73_m2} (ref >59)
GLOBULIN, TOTAL: 2.1 g/dL (ref 1.5–4.5)
Glucose: 93 mg/dL (ref 65–99)
Potassium: 4.5 mmol/L (ref 3.5–5.2)
Protein, total: 6.3 g/dL (ref 6.0–8.5)
Sodium: 143 mmol/L (ref 134–144)

## 2018-02-04 LAB — LIPID PANEL
Cholesterol, total: 111 mg/dL (ref 100–199)
HDL Cholesterol: 36 mg/dL — ABNORMAL LOW (ref >39)
LDL, calculated: 63 mg/dL (ref 0–99)
Triglyceride: 59 mg/dL (ref 0–149)
VLDL, calculated: 12 mg/dL (ref 5–40)

## 2018-02-04 LAB — TSH 3RD GENERATION: TSH: 1.67 u[IU]/mL (ref 0.450–4.500)

## 2018-02-10 ENCOUNTER — Ambulatory Visit: Admit: 2018-02-10 | Payer: MEDICARE | Attending: Medical | Primary: Family Medicine

## 2018-02-10 DIAGNOSIS — I10 Essential (primary) hypertension: Secondary | ICD-10-CM

## 2018-02-10 NOTE — Patient Instructions (Signed)
*  A regular exercise program is a great way to maintain or to help you lose weight. General recommendations are: to perform at least 30 minutes, 5 times a week, of  Continuous aerobic exercise. Aerobic exercise consists of using the large muscles of your lower body and maintaining a heart rate of 60-80% of your maximum heart rate. This can be done with walking, swimming or biking. The best way to know you are working hard enough is the "talk test". It should be uncomfortable but not impossiible for you to have a conversation. Remember to start at a low level and increase the intensity from your start point, as this will avoid injury.   *Make sure to get plenty of exercise. Eat plenty of fruits, vegetables, whole grains and fish - all of which promote heart health. Avoid saturated and trans fats, which can raise cholesterol levels and add weight. Limit sweets, starches (carbohydrates) such as breads, pasta, potatoes, candy, soda, and alcohol. Try eating smaller meals throughout the day instead of 3 large meals. We will touch base on your progress at your next appointment.    Marland Kitchen

## 2018-02-10 NOTE — Progress Notes (Signed)
[facility]  [address] [place] [address]  Phone [phone]  Fax:  [phone]    Patient: Matthew Sherman  Date of Birth: 26-Apr-1941  Age 77 y.o.  Sex male  MEDICAL RECORD NUMBER [id number]  Visit Date: 02/10/18  Author:  Sueanne Margarita, PA-C    Family Practice Clinic Note    Chief Complaint   Patient presents with   ??? Hypertension     He has been checking his BP at home a few times daily and it seems to be higher in the evenings.    ??? Results     Labs drawn on 02/03/18 and had testing done at radiology in June 2019.        History of Present Illness  This is a 77 year old male who returns today for 3-week follow-up.  To follow-up hypertension.  He brings his home readings in for review.  He has noted improvement overall and his blood pressures.  Ranging 115-145/60-70s.  He reports compliance with Lotrel 2.5/10 mg once daily.  He denies any problems or side effects on the medication.  Denies chest pain, palpitations, shortness of breath or lower extremity edema.    Patient also here today to review recent arterial Doppler studies of the lower extremities.  He continues to have some infrequent/intermittent discomfort in the back of his legs.  No significant change from last visit.      =======================================================================================  Hx 01/19/18 (DF)    ??  ASSESSMENT & PLAN        Encounter Diagnoses   ?? ?? ICD-10-CM ICD-9-CM   1. Essential hypertension I10 401.9   2. Claudication of lower extremity (HCC) I73.9 443.9   ??  ??  1. Essential hypertension  *Blood pressure is elevated today with goal at < 140/90. Limit your sodium intake to less than 2 grams daily. A weight loss of 5-10 pounds can also be helpful. Avoid excessive alcohol and caffeine. Start the Lotrel, one a day. Check your blood pressure 3-4 times a week or more frequently if any problems, record them, and bring them in for [place] to review at your next  visit. Notify the office if persistently > 160/100 on more than 3 separate occasions and/or if you develop headaches, dizziness, lightheadedness, chest pain, shortness of breath, palpitations, vision changes, speech abnormalities or any other problems.  *We will obtain some fasting labs prior to next visit for review.  ??  - amLODIPine-benazepril (LOTREL) 2.5-10 mg per capsule; Take 1 Cap by mouth daily.  Dispense: 30 Cap; Refill: 5  - CBC WITH AUTOMATED DIFF; Future  - LIPID PANEL; Future  - METABOLIC PANEL, COMPREHENSIVE; Future  - TSH 3RD GENERATION; Future  ??  2. Claudication of lower extremity ([place])  *We will arrange referral for lower extremity arterial Doppler study for evaluation.  ??  - DUPLEX LOWER EXT ARTERY BILAT; Future  - CBC WITH AUTOMATED DIFF; Future  - LIPID PANEL; Future  - METABOLIC PANEL, COMPREHENSIVE; Future  - TSH 3RD GENERATION; Future  ??  ??        Orders Placed This Encounter   ??? DUPLEX LOWER EXT ARTERY BILAT   ?? ?? Please evaluate for claudication of bilat lower extremities   ?? ?? Standing Status:   Future   ?? ?? Standing Expiration Date:   02/19/2019   ??? CBC WITH AUTOMATED DIFF   ?? ?? Standing Status:   Future   ?? ?? Standing Expiration Date:   07/21/2018   ???  LIPID PANEL   ?? ?? Standing Status:   Future   ?? ?? Standing Expiration Date:   29/56/2130   ??? METABOLIC PANEL, COMPREHENSIVE   ?? ?? Standing Status:   Future   ?? ?? Standing Expiration Date:   07/21/2018   ??? TSH, 3RD GENERATION   ?? ?? Standing Status:   Future   ?? ?? Standing Expiration Date:   07/21/2018   ??? amLODIPine-benazepril (LOTREL) 2.5-10 mg per capsule   ?? ?? Sig: Take 1 Cap by mouth daily.   ?? ?? Dispense:  30 Cap   ?? ?? Refill:  5   ??  I have reviewed the patient's past medical history, social history and family history and vitals.  We have discussed treatment plan and follow up and given patient instructions.  Patient's questions are answered and we will follow up as indicated.  ??   Dictated using voice recognition software. ??Proof read but unrecognized errors may exist.  ??  ??  Follow-up and Dispositions    ?? Return in about 3 weeks (around 02/09/2018) for f/u lower ext art doppler, HTN, labs.  ??   ??  ??  Sueanne Margarita, PA-C  ========================================================================================      Past History:    Past Medical history   Past Medical History:   Diagnosis Date   ??? Hypertension        Current Problem List:   Patient Active Problem List   Diagnosis Code   ??? Hypertension I10       Current Medications:   Current Outpatient Medications   Medication Sig Dispense   ??? amLODIPine-benazepril (LOTREL) 2.5-10 mg per capsule Take 1 Cap by mouth daily. 30 Cap     No current facility-administered medications for this visit.        Allergies:No Known Allergies    Surgical History:  Past Surgical History:   Procedure Laterality Date   ??? HX APPENDECTOMY  1956   ??? HX CATARACT REMOVAL Bilateral     right done 2018, left 2002       Social History:   Social History     Social History Narrative   ??? Not on file      Social History     Socioeconomic History   ??? Marital status: MARRIED     Spouse name: Not on file   ??? Number of children: Not on file   ??? Years of education: Not on file   ??? Highest education level: Not on file   Occupational History   ??? Not on file   Social Needs   ??? Financial resource strain: Not on file   ??? Food insecurity:     Worry: Not on file     Inability: Not on file   ??? Transportation needs:     Medical: Not on file     Non-medical: Not on file   Tobacco Use   ??? Smoking status: Never Smoker   ??? Smokeless tobacco: Never Used   Substance and Sexual Activity   ??? Alcohol use: Never     Frequency: Never   ??? Drug use: Never   ??? Sexual activity: Not Currently     Partners: Female   Lifestyle   ??? Physical activity:     Days per week: Not on file     Minutes per session: Not on file   ??? Stress: Not on file   Relationships   ??? Social connections:      Talks on  phone: Not on file     Gets together: Not on file     Attends religious service: Not on file     Active member of club or organization: Not on file     Attends meetings of clubs or organizations: Not on file     Relationship status: Not on file   ??? Intimate partner violence:     Fear of current or ex partner: Not on file     Emotionally abused: Not on file     Physically abused: Not on file     Forced sexual activity: Not on file   Other Topics Concern   ??? Not on file   Social History Narrative   ??? Not on file         ROS  Review of Systems   Constitutional: Negative for chills, fatigue and fever.   Respiratory: Negative for chest tightness and shortness of breath.    Cardiovascular: Negative for chest pain, palpitations and leg swelling.   Neurological: Negative for dizziness, light-headedness and headaches.         Visit Vitals  BP 140/72 (BP 1 Location: Left arm, BP Patient Position: Sitting)   Pulse 67   Temp 97.9 ??F (36.6 ??C) (Tympanic)   Resp 16   Ht 6' (1.829 m)   Wt 211 lb 12.8 oz (96.1 kg)   SpO2 97%   BMI 28.73 kg/m??     Body mass index is 28.73 kg/m??.     Physical Exam    Physical Exam   Constitutional: He is oriented to person, place, and time. He appears well-developed and well-nourished.   Elderly gentleman in no apparent distress   HENT:   Head: Normocephalic and atraumatic.   Eyes: Conjunctivae are normal.   Neck: Neck supple. No thyromegaly present.   Cardiovascular: Normal rate, regular rhythm and normal heart sounds.   No murmur heard.  Pulmonary/Chest: Effort normal and breath sounds normal.   Musculoskeletal: Normal range of motion.   Lymphadenopathy:     He has no cervical adenopathy.   Neurological: He is alert and oriented to person, place, and time.   Psychiatric: He has a normal mood and affect.   =================================================================================     01/21/2018 13:27 01/21/2018 14:03   Study Result      TITLE: Lower extremity arterial ultrasound examination.  ??  INDICATION: Lower extremity claudication  ??  TECHNIQUE: Grayscale, color, and Doppler interrogation performed.  The entire  length of all of the arteries of the extremity were evaluated.  ??  COMPARISON: None.  ??  ??  ??  RIGHT LOWER EXTREMITY:  Peak systolic velocities--within normal limits Normal  triphasic waveform throughout.  ??  LEFT LOWER EXTREMITY:  Peak systolic velocities--within normal limits Normal  triphasic waveforms throughout.  ??  ABDOMEN:  No evidence of aneurysm.  ??  IMPRESSION  IMPRESSION:  No significant arterial disease identified.       ======================================================================================   Lab Results   Component Value Date/Time    Sodium 143 02/03/2018 10:19 AM    Potassium 4.5 02/03/2018 10:19 AM    Chloride 104 02/03/2018 10:19 AM    CO2 22 02/03/2018 10:19 AM    Glucose 93 02/03/2018 10:19 AM    BUN 13 02/03/2018 10:19 AM    Creatinine 0.91 02/03/2018 10:19 AM    BUN/Creatinine ratio 14 02/03/2018 10:19 AM    GFR est AA 93 02/03/2018 10:19 AM    GFR est non-AA 80 02/03/2018 10:19 AM  Calcium 9.2 02/03/2018 10:19 AM    Bilirubin, total 0.6 02/03/2018 10:19 AM    AST (SGOT) 19 02/03/2018 10:19 AM    Alk. phosphatase 61 02/03/2018 10:19 AM    Protein, total 6.3 02/03/2018 10:19 AM    Albumin 4.2 02/03/2018 10:19 AM    A-G Ratio 2.0 02/03/2018 10:19 AM    ALT (SGPT) 24 02/03/2018 10:19 AM     Lab Results   Component Value Date/Time    WBC 7.5 02/03/2018 10:19 AM    HGB 16.7 02/03/2018 10:19 AM    HCT 49.2 02/03/2018 10:19 AM    PLATELET 192 02/03/2018 10:19 AM    MCV 84 02/03/2018 10:19 AM     Lab Results   Component Value Date/Time    Cholesterol, total 111 02/03/2018 10:19 AM    HDL Cholesterol 36 (L) 02/03/2018 10:19 AM    LDL, calculated 63 02/03/2018 10:19 AM    VLDL, calculated 12 02/03/2018 10:19 AM    Triglyceride 59 02/03/2018 10:19 AM     Lab Results   Component Value Date/Time     TSH 1.670 02/03/2018 10:19 AM        ASSESSMENT & PLAN  Encounter Diagnoses     ICD-10-CM ICD-9-CM   1. Essential hypertension I10 401.9   2. Leg pain, bilateral M79.604 729.5    M79.605    3. Overweight (BMI 25.0-29.9) E66.3 278.02       1. Essential hypertension  *Blood pressure is controlled today at goal < 140/90. Continue Lotrel the same. Check your blood pressures at home 2-3 times weekly and more often if any problems, record them, and bring them in for [place] to review.      2. Leg pain, bilateral  *Arterial doppler study was normal.  *Would consider imaging of the lumbar spine if symptoms should worsen or persist.    3.  Overweight  *A regular exercise program is a great way to maintain or to help you lose weight. General recommendations are: to perform at least 30 minutes, 5 times a week, of  Continuous aerobic exercise. Aerobic exercise consists of using the large muscles of your lower body and maintaining a heart rate of 60-80% of your maximum heart rate. This can be done with walking, swimming or biking. The best way to know you are working hard enough is the "talk test". It should be uncomfortable but not impossiible for you to have a conversation. Remember to start at a low level and increase the intensity from your start point, as this will avoid injury.   *Make sure to get plenty of exercise. Eat plenty of fruits, vegetables, whole grains and fish - all of which promote heart health. Avoid saturated and trans fats, which can raise cholesterol levels and add weight. Limit sweets, starches (carbohydrates) such as breads, pasta, potatoes, candy, soda, and alcohol. Try eating smaller meals throughout the day instead of 3 large meals. We will touch base on your progress at your next appointment.      No orders of the defined types were placed in this encounter.    I have reviewed the patient's past medical history, social history and family history and vitals.   We have discussed treatment plan and follow up and given patient instructions.  Patient's questions are answered and we will follow up as indicated.    Dictated using voice recognition software.  Proof read but unrecognized errors may exist.      Follow-up and Dispositions    ??  Return in about 6 months (around 08/13/2018) for 6 mo f/u HTN.         Sueanne Margarita, PA-C

## 2018-02-24 ENCOUNTER — Other Ambulatory Visit: Payer: Self-pay | Admitting: Family

## 2018-03-18 ENCOUNTER — Inpatient Hospital Stay: Admit: 2018-03-18 | Payer: MEDICARE | Primary: Family Medicine

## 2018-03-18 ENCOUNTER — Ambulatory Visit: Admit: 2018-03-18 | Discharge: 2018-03-18 | Payer: MEDICARE | Attending: Family Medicine | Primary: Family Medicine

## 2018-03-18 DIAGNOSIS — M47816 Spondylosis without myelopathy or radiculopathy, lumbar region: Secondary | ICD-10-CM

## 2018-03-18 DIAGNOSIS — R200 Anesthesia of skin: Secondary | ICD-10-CM

## 2018-03-18 NOTE — Progress Notes (Addendum)
*ATTENTION:  This note has been created by a medical student for educational purposes only.  Please do not refer to the content of this note for clinical decision-making, billing, or other purposes.  Please see attending physician???s note to obtain clinical information on this patient.*     PROGRESS NOTE    SUBJECTIVE:   Matthew Sherman is a 77 y.o. male seen for a follow up visit regarding Leg Pain (when walking his feet and legs will go numb,get cold and start to give out; going on for awhile now)    Numbness and tingling  Pt presents today with N/T in his lower extremities. The sensation is brought on by standing and walking. It started sometime between April and now, and the onset has been gradual. He states that roughly 5 mins after standing or walking, his right foot begins to feel "cool and damp". Shortly after, he feels the sensation in his left foot and it travels B/L up to his ankles and posterior knee. He lives a very active lifestyle, and is distressed by his recent inability to enjoy his every day activities that include walking and standing.     Past Medical History, Past Surgical History, Family history, Social History, and Medications were all reviewed with the patient today and updated as necessary.       Current Outpatient Medications   Medication Sig Dispense Refill   ??? amLODIPine-benazepril (LOTREL) 2.5-10 mg per capsule Take 1 Cap by mouth daily. 30 Cap 5     No Known Allergies  Patient Active Problem List   Diagnosis Code   ??? Hypertension I10     Past Medical History:   Diagnosis Date   ??? Hypertension      Past Surgical History:   Procedure Laterality Date   ??? HX APPENDECTOMY  1956   ??? HX CATARACT REMOVAL Bilateral     right done 2018, left 2002     Social History     Tobacco Use   ??? Smoking status: Never Smoker   ??? Smokeless tobacco: Never Used   Substance Use Topics   ??? Alcohol use: Never     Frequency: Never         Review of Systems    Constitutional: Positive for activity change. Negative for chills, fatigue and fever.   HENT: Negative.    Eyes: Negative.    Respiratory: Negative.    Cardiovascular: Negative.    Gastrointestinal: Negative.    Endocrine: Negative.    Genitourinary: Negative.    Musculoskeletal: Positive for gait problem.        Inability to walk steady once feet go numb     Skin: Negative.    Allergic/Immunologic: Negative.    Neurological: Positive for numbness.        Feet and legs go numb after standing or walking   Hematological: Negative.    Psychiatric/Behavioral: Negative.          OBJECTIVE:  Visit Vitals  BP 152/80 (BP 1 Location: Right arm, BP Patient Position: Sitting)   Pulse 65   Temp 97.3 ??F (36.3 ??C) (Tympanic)   Ht 6\' 1"  (1.854 m)   Wt 210 lb 9.6 oz (95.5 kg)   SpO2 98%   BMI 27.79 kg/m??        Physical Exam   Constitutional: He appears well-developed and well-nourished. No distress.   HENT:   Head: Normocephalic and atraumatic.   Eyes: Pupils are equal, round, and reactive to light.  Neck: Normal range of motion.   Cardiovascular: Normal rate, regular rhythm, normal heart sounds and intact distal pulses. Exam reveals no gallop and no friction rub.   No murmur heard.  Pulmonary/Chest: Effort normal and breath sounds normal. He has no wheezes. He has no rales.   Musculoskeletal: Normal range of motion.   Despite numbness, nl ROM in the lumbar spine and hip/knee/ankle joints   Neurological: He is alert. He has normal strength. No sensory deficit.   N/T reproduced in foot during sciatic compression test   Skin: Skin is warm. He is not diaphoretic.   Psychiatric: He has a normal mood and affect. His behavior is normal.       Medical problems and test results were reviewed with the patient today.     No results found for this or any previous visit (from the past 672 hour(s)).      ASSESSMENT and PLAN    Diagnoses and all orders for this visit:    1. Numbness and tingling  -     XR SPINE LUMB 2 OR 3 V; Future     Pt consents to future XRAY, and pending the results, obtaining an MRI to show the extent of nerve involvement. Will return for OMT. (will check DTR at OMT visit**)    Will monitor BP for next 3 weeks and adjust accordingly at next OV    Follow-up and Dispositions    ?? Return in about 3 weeks (around 04/08/2018) for OMT.

## 2018-03-18 NOTE — Progress Notes (Signed)
XRay came back normal. I am ordering his MRI.

## 2018-03-18 NOTE — Progress Notes (Signed)
PROGRESS NOTE    SUBJECTIVE:   Matthew Sherman is a 77 y.o. male seen for a follow up visit regarding Leg Pain (when walking his feet and legs will go numb,get cold and start to give out; going on for awhile now)    Numbness and tingling  Pt presents today with N/T in his lower extremities. The sensation is brought on by standing and walking. It started sometime between April and now, and the onset has been gradual. He states that roughly 5 mins after standing or walking, his right foot begins to feel "cool and damp". Shortly after, he feels the sensation in his left foot and it travels B/L up to his ankles and posterior knee. It stops mid way up his hamstring on both legs. He lives a very active lifestyle, and is distressed by his recent inability to enjoy his every day activities that include walking and standing. There was no inciting incident. No pain. No bowel or bladder incontinence. Numbness improves slightly when he bends forward at his waist. None with laying down.     Past Medical History, Past Surgical History, Family history, Social History, and Medications were all reviewed with the patient today and updated as necessary.       Current Outpatient Medications   Medication Sig Dispense Refill   ??? amLODIPine-benazepril (LOTREL) 2.5-10 mg per capsule Take 1 Cap by mouth daily. 30 Cap 5     No Known Allergies  Patient Active Problem List   Diagnosis Code   ??? Hypertension I10     Past Medical History:   Diagnosis Date   ??? Hypertension      Past Surgical History:   Procedure Laterality Date   ??? HX APPENDECTOMY  1956   ??? HX CATARACT REMOVAL Bilateral     right done 2018, left 2002     Social History     Tobacco Use   ??? Smoking status: Never Smoker   ??? Smokeless tobacco: Never Used   Substance Use Topics   ??? Alcohol use: Never     Frequency: Never         Review of Systems   Constitutional: Positive for activity change. Negative for chills, fatigue and fever.   HENT: Negative.    Eyes: Negative.     Respiratory: Negative.    Cardiovascular: Negative.    Gastrointestinal: Negative.    Endocrine: Negative.    Genitourinary: Negative.    Musculoskeletal: Positive for gait problem.        Inability to walk steady once feet go numb     Skin: Negative.    Allergic/Immunologic: Negative.    Neurological: Positive for numbness.        Feet and legs go numb after standing or walking   Hematological: Negative.    Psychiatric/Behavioral: Negative.          OBJECTIVE:  Visit Vitals  BP 152/80 (BP 1 Location: Right arm, BP Patient Position: Sitting)   Pulse 65   Temp 97.3 ??F (36.3 ??C) (Tympanic)   Ht 6\' 1"  (1.854 m)   Wt 210 lb 9.6 oz (95.5 kg)   SpO2 98%   BMI 27.79 kg/m??        Physical Exam   Constitutional: He appears well-developed and well-nourished. No distress.   HENT:   Head: Normocephalic and atraumatic.   Eyes: Pupils are equal, round, and reactive to light.   Neck: Normal range of motion.   Cardiovascular: Normal rate, regular rhythm, normal  heart sounds and intact distal pulses. Exam reveals no gallop and no friction rub.   No murmur heard.  Pulmonary/Chest: Effort normal and breath sounds normal. He has no wheezes. He has no rales.   Musculoskeletal: Normal range of motion.   Despite numbness, nl ROM in the lumbar spine and hip/knee/ankle joints   Anterior innominate on right.   Neurological: He is alert. He has normal strength. No sensory deficit.   N/T reproduced in foot during sciatic compression test   Skin: Skin is warm. He is not diaphoretic.   Psychiatric: He has a normal mood and affect. His behavior is normal.       Medical problems and test results were reviewed with the patient today.     No results found for this or any previous visit (from the past 672 hour(s)).      ASSESSMENT and PLAN    Diagnoses and all orders for this visit:    1. Numbness and tingling  -     XR SPINE LUMB 2 OR 3 V; Future    2. Somatic dysfunction of hip     Pt consents to future XRAY, and pending the results, obtaining an MRI to show the extent of nerve involvement. Will return for OMT as well. OMT, ME, to innominate region today.    (will check DTR at OMT visit**)    Will monitor BP for next 3 weeks and adjust accordingly at next OV    Follow-up and Dispositions    ?? Return in about 3 weeks (around 04/08/2018) for OMT.

## 2018-03-18 NOTE — Progress Notes (Signed)
Informed of results

## 2018-03-26 ENCOUNTER — Encounter

## 2018-04-07 ENCOUNTER — Inpatient Hospital Stay: Admit: 2018-04-07 | Payer: MEDICARE | Attending: Family Medicine | Primary: Family Medicine

## 2018-04-07 DIAGNOSIS — M5416 Radiculopathy, lumbar region: Secondary | ICD-10-CM

## 2018-04-08 ENCOUNTER — Ambulatory Visit: Admit: 2018-04-08 | Payer: MEDICARE | Attending: Family Medicine | Primary: Family Medicine

## 2018-04-08 DIAGNOSIS — M9901 Segmental and somatic dysfunction of cervical region: Secondary | ICD-10-CM

## 2018-04-08 NOTE — Progress Notes (Signed)
Patient: Matthew Sherman  Date of Birth: May 25, 1941  MEDICAL RECORD NUMBER [id number]  Visit Date: 05/16/18  Author:  Olga Millers, DO    Family Practice Clinic Note    Patient presents for OMT for spinal stenosis of his lumbar and numbness and tingling in his lower extremities. The sensation is brought on by standing and walking. It started sometime between April and now, and the onset has been gradual. He states that roughly 5 mins after standing or walking, his right foot begins to feel "cool and damp". Shortly after, he feels the sensation in his left foot and it travels B/L up to his ankles and posterior knee. It stops mid way up his hamstring on both legs. He lives a very active lifestyle, and is distressed by his recent inability to enjoy his every day activities that include walking and standing. There was no inciting incident. No pain. No bowel or bladder incontinence. Numbness improves slightly when he bends forward at his waist. None with laying down.             Allergies:No Known Allergies    Current Problem List:   Patient Active Problem List   Diagnosis Code   ??? Hypertension I10       Current Medications:   Current Outpatient Medications   Medication Sig Dispense   ??? amLODIPine-benazepril (LOTREL) 2.5-10 mg per capsule Take 1 Cap by mouth daily. 30 Cap     No current facility-administered medications for this visit.        Social History:   Social History     Socioeconomic History   ??? Marital status: MARRIED     Spouse name: Not on file   ??? Number of children: Not on file   ??? Years of education: Not on file   ??? Highest education level: Not on file   Tobacco Use   ??? Smoking status: Never Smoker   ??? Smokeless tobacco: Never Used   Substance and Sexual Activity   ??? Alcohol use: Never     Frequency: Never   ??? Drug use: Never   ??? Sexual activity: Not Currently     Partners: Female       Family History:   Family History   Problem Relation Age of Onset   ??? Heart Attack Father    ??? No Known Problems Sister     ??? No Known Problems Brother    ??? No Known Problems Maternal Grandmother    ??? No Known Problems Maternal Grandfather    ??? No Known Problems Paternal Grandmother    ??? No Known Problems Paternal Grandfather        Surgical History:  Past Surgical History:   Procedure Laterality Date   ??? HX APPENDECTOMY  1956   ??? HX CATARACT REMOVAL Bilateral     right done 2018, left 2002   ??? HX TONSIL AND ADENOIDECTOMY             ROS                There were no vitals taken for this visit.  There is no height or weight on file to calculate BMI.     Physical Exam       Cervical : C3-4 rotated right  Thoracic : T5-7 rotated right, sidebent right  Lumbar : L4-5 rotated left  Innominate : anterior innominate on right  Sacrum : left on left torsion  Ribs : in align  Extremities :  in align            Assessment and Plan  Diagnoses and all orders for this visit:    1. Somatic dysfunction of cervical region  -     PR OSTEOPATHIC MANIP,5-6 BODY REGN    2. Spinal stenosis of lumbar region with neurogenic claudication  -     REFERRAL TO NEUROSURGERY  -     PR OSTEOPATHIC MANIP,5-6 BODY REGN    3. Somatic dysfunction of thoracic region  -     PR OSTEOPATHIC MANIP,5-6 BODY REGN    4. Somatic dysfunction of lumbar region  -     PR OSTEOPATHIC MANIP,5-6 BODY REGN    5. Somatic dysfunction of pelvis region  -     PR OSTEOPATHIC MANIP,5-6 BODY REGN    6. Somatic dysfunction of sacral region  -     PR OSTEOPATHIC MANIP,5-6 BODY REGN        Technique used : ME to cervical region     Technique used : ME to thoracic region     Technique used : ME to lumbar region     Technique used : ME to innominate region     Technique used : compression to sacral:  region         MRI showed fairly severe spinal stenosis, so he was sent to neurosurgery. RTC prn    Olga Millers, DO

## 2018-04-13 NOTE — Telephone Encounter (Signed)
Pt wife called and states he is unable to stand at all now and she is very worried on what to do and they have an upcoming wedding and he is suppose to walk his daughter down the aisle and currently he can not do that.

## 2018-04-13 NOTE — Telephone Encounter (Signed)
Patient is scheduled to see Neurosurgeon 10/7. Spoke with patient.

## 2018-05-04 ENCOUNTER — Ambulatory Visit: Admit: 2018-05-04 | Discharge: 2018-05-04 | Payer: MEDICARE | Attending: Neurological Surgery | Primary: Family Medicine

## 2018-05-04 DIAGNOSIS — M48062 Spinal stenosis, lumbar region with neurogenic claudication: Secondary | ICD-10-CM

## 2018-05-04 NOTE — Progress Notes (Signed)
[facility] CLINIC NOTE:   History of Present Illness:    CC: Complains that his feet feel cold and wet and that he has straining or cramping in his bilateral lower extremities.    Matthew Sherman is a 77 y.o. male who presents today for evaluation of complaints of his feet feeling cold and wet as well as straining or cramping in his lower extremities.  He states that he noticed the symptoms approximately 6 months ago.  He states that he has worked with a Restaurant manager, fast food in [place], [place] but has not had any improvement in symptoms.  He previously was doing exercises with his low back but has since stopped the exercises he feels that his pain has improved after stopping exercising.  He states that he cannot walk more than approximately 10 minutes without having to stop secondary to discomfort.  He states that he has noticed a significant decline or decrease in his activities of daily living and quality of life over the course the past 6 months.  The patient describes his feet feeling heavy or cold and wet as his primary complaint.  He also endorses that there is straining in his bilateral lower extremities that is associated with any walking for any length of time. The patient has an MRI lumbar spine without contrast performed on 04/07/2018 that demonstrates multilevel lumbar spinal stenosis.  At L4-L5 there is severe central and lateral recess stenosis secondary to facet arthropathy and ligamentum flavum hypertrophy that results in almost a complete block at the L4-L5 level.  At L3-L4 there is severe lateral recess and central spinal stenosis secondary to facet arthropathy ligamentum flavum hypertrophy and epidural lipomatosis and at L2-L3 there is also severe central lateral recess stenosis secondary to facet arthropathy and ligamentum flavum hypertrophy.  There is moderate left neuroforaminal stenosis at L2-L3, severe  right-sided neuroforaminal stenosis at L4-L5 moderate bilateral neuroforaminal stenosis at L3-L4, moderate bilateral neuroforaminal stenosis at L5 and S1.    Past Medical History:   Diagnosis Date   ??? Hypertension       Past Surgical History:   Procedure Laterality Date   ??? HX APPENDECTOMY  1956   ??? HX CATARACT REMOVAL Bilateral     right done 2018, left 2002   ??? HX TONSIL AND ADENOIDECTOMY       No Known Allergies   Family History   Problem Relation Age of Onset   ??? Heart Attack Father    ??? No Known Problems Sister    ??? No Known Problems Brother    ??? No Known Problems Maternal Grandmother    ??? No Known Problems Maternal Grandfather    ??? No Known Problems Paternal Grandmother    ??? No Known Problems Paternal Grandfather       Social History     Socioeconomic History   ??? Marital status: MARRIED     Spouse name: Not on file   ??? Number of children: Not on file   ??? Years of education: Not on file   ??? Highest education level: Not on file   Occupational History   ??? Not on file   Social Needs   ??? Financial resource strain: Not on file   ??? Food insecurity:     Worry: Not on file     Inability: Not on file   ??? Transportation needs:     Medical: Not on file     Non-medical: Not on file   Tobacco  Use   ??? Smoking status: Never Smoker   ??? Smokeless tobacco: Never Used   Substance and Sexual Activity   ??? Alcohol use: Never     Frequency: Never   ??? Drug use: Never   ??? Sexual activity: Not Currently     Partners: Female   Lifestyle   ??? Physical activity:     Days per week: Not on file     Minutes per session: Not on file   ??? Stress: Not on file   Relationships   ??? Social connections:     Talks on phone: Not on file     Gets together: Not on file     Attends religious service: Not on file     Active member of club or organization: Not on file     Attends meetings of clubs or organizations: Not on file     Relationship status: Not on file   ??? Intimate partner violence:     Fear of current or ex partner: Not on file      Emotionally abused: Not on file     Physically abused: Not on file     Forced sexual activity: Not on file   Other Topics Concern   ??? Not on file   Social History Narrative   ??? Not on file     Current Outpatient Medications   Medication Sig Dispense Refill   ??? amLODIPine-benazepril (LOTREL) 2.5-10 mg per capsule Take 1 Cap by mouth daily. 30 Cap 5     Patient Active Problem List   Diagnosis Code   ??? Hypertension I10        Review of Systems: A complete ROS was done and as stated in the HPI or otherwise negative.Review of Systems    Constitutional:                    No recent weight changes, fever, fatigue, sleep difficulties, loss of appetite   ENT/Mouth:  No hearing loss, POSringing in the ears, chronic sinus problem, nose bleeds,sore throat, voice change, hoarseness, swollen glands in neck, or difficulties with chewing and swallowing.   Cardiovascular:  No chest pain/angina pectoris, palpitations, swelling of feet/ankles/hands, or calf pain while walking.     Respiratory: No chronic or frequent coughs, spitting up blood, shortness of breath, asthma, or wheezing.     Gastrointestinal: No a bdominal pain, heartburn, nausea, vomiting, constipation, or frequent diarrhea     Genitourinary: POS frequent urination, burning or painful urination, or blood in urine     Musculoskeletal:   NoPOS joint pain, stiffness/swelling,POS weakness of muscles, or muscle pain/cramp     Integument:   No rash/itching     Neurological:  No dizziness/vertigo, POSnumbness/tingling sensation, tremors,POS weakness in limbs, frequent or recurring headaches, memory loss or confusion.       Endocrine: No excessive thirst or urination, heat or cold intolerance.                 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   Hearing grossly intact   Face symmetric and tongue mid-line on protrusion   No mid-line cervical, thoracic, or lumbar tenderness to palpation   Patient with strength exam as follows:   Upper Extremities: Right Left      Deltoid  5 5    Biceps  5 5    Triceps 5 5    Grip  5 5     Lower Extremities:      Hip Flex 5 5    Quads  5 5    Hamstrings 5 5    Dorsiflex 5 5    Plantarflex 5 5    EHL  5 5  Sensation grossly intact light touch  DTR diminished patellar responses  No clonus or babinski present   No Hoffman's sign present bilaterally   Negative straight leg raise test on the right and left  Gait: Ambulates with a slightly antalgic gait slightly hunched forward.    Assessment & Plan:  Matthew Sherman is a 77 y.o. male who presents  complaints of his feet feeling cold and wet as well as straining or cramping in his lower extremities. I have independently reviewed and interpreted MRI lumbar spine without contrast performed on 04/07/2018 that demonstrates multilevel lumbar spinal stenosis.  At L4-L5 there is severe central and lateral recess stenosis secondary to facet arthropathy and ligamentum flavum hypertrophy that results in almost a complete block at the L4-L5 level.  At L3-L4 there is severe lateral recess and central spinal stenosis secondary to facet arthropathy ligamentum flavum hypertrophy and epidural lipomatosis and at L2-L3 there is also severe central lateral recess stenosis secondary to facet arthropathy and ligamentum flavum hypertrophy.  There is moderate left neuroforaminal stenosis at L2-L3, severe right-sided neuroforaminal stenosis at L4-L5 moderate bilateral neuroforaminal stenosis at L3-L4, moderate bilateral neuroforaminal  stenosis at L5 and S1.  Patient describes what can only be interpreted as lumbar spinal stenosis with neurogenic claudication secondary to the aforementioned severe stenosis at L2-L3, L3-L4 and L4-L5.  I believe that any further conservative medical management will not benefit the patient as he has such severe stenosis that he is at risk for developing further neurologic decline and/or deficits.  I recommend proceeding with a L2-L5 open laminectomies for decompression. Lumbar laminectomies for decompression Consent: The relative risks of complication include but are not limited to infection, bleeding, spinal fluid leak, major vascular injury, increased pain, weakness numbness, delayed spinal instability, paralysis, incontinence, impotence, heart attack, stroke and death were discussed with patient and family members present. They have been provided the opportunity to ask any questions regarding the surgical procedure. The patient and family members present expressed understanding and agree to proceed with surgery.      ICD-10-CM ICD-9-CM    1. Lumbar stenosis with neurogenic claudication M48.062 724.03      Kenwood Rosiak R. Bradd Canary, MD  [facility]    Notes are transcribed with Dragon, a medical voice recording dictation service, and may contain minor errors.

## 2018-06-01 ENCOUNTER — Inpatient Hospital Stay: Admit: 2018-06-01 | Payer: MEDICARE | Primary: Family Medicine

## 2018-06-01 DIAGNOSIS — Z01818 Encounter for other preprocedural examination: Secondary | ICD-10-CM

## 2018-06-01 LAB — MSSA/MRSA SC BY PCR, NASAL SWAB

## 2018-06-01 LAB — HEMOGLOBIN: HGB: 17.3 g/dL — ABNORMAL HIGH (ref 13.6–17.2)

## 2018-06-01 NOTE — Other (Signed)
Patient verified name and DOB. Patient provided medical/health information and PTA medications to the best of their ability.    TYPE  CASE:2  Order for consent not found in EHR .   Labs per surgeon:none     Labs per anesthesia protocol: hgb, MRSA. Results pending  EKG  :  Not needed at time of PAT. Pt without CAD , DM or CVA    Patient provided with and instructed on education handouts including Guide to Surgery, blood transfusions, pain management, and hand hygiene for the family and community, and Administrator, arts.     Hibiclens and instructions given per hospital policy.      Instructed patient to continue previous medications as prescribed prior to surgery unless otherwise directed and to take the following medications the day of surgery according to anesthesia guidelines : none .  Instructed patient to hold  the following medications: none.    Original medication prescription bottles not visualized during patient appointment.     Patient teach back successful and patient demonstrates knowledge of instruction.

## 2018-06-01 NOTE — Other (Signed)
Recent Results (from the past 12 hour(s))   HEMOGLOBIN    Collection Time: 06/01/18 11:41 AM   Result Value Ref Range    HGB 17.3 (H) 13.6 - 17.2 g/dL   MSSA/MRSA SC BY PCR, NASAL SWAB    Collection Time: 06/01/18 11:41 AM   Result Value Ref Range    Special Requests: NO SPECIAL REQUESTS      Culture result:        SA target not detected.                                 A MRSA NEGATIVE, SA NEGATIVE test result does not preclude MRSA or SA nasal colonization.   Reviewed

## 2018-06-09 ENCOUNTER — Inpatient Hospital Stay
Admit: 2018-06-09 | Discharge: 2018-06-12 | Disposition: A | Discharge status: Home / Self Care | Payer: MEDICARE | Attending: Neurological Surgery | Admitting: Neurological Surgery

## 2018-06-09 ENCOUNTER — Observation Stay: Admit: 2018-06-09 | Payer: MEDICARE | Primary: Family Medicine

## 2018-06-09 DIAGNOSIS — M48062 Spinal stenosis, lumbar region with neurogenic claudication: Secondary | ICD-10-CM

## 2018-06-09 MED ORDER — ACETAMINOPHEN 1,000 MG/100 ML (10 MG/ML) IV
1000 mg/100 mL (10 mg/mL) | INTRAVENOUS | Status: DC | PRN
Start: 2018-06-09 — End: 2018-06-09
  Administered 2018-06-09: 19:00:00 via INTRAVENOUS

## 2018-06-09 MED ORDER — OXYCODONE 5 MG TAB
5 mg | Freq: Once | ORAL | Status: DC | PRN
Start: 2018-06-09 — End: 2018-06-09

## 2018-06-09 MED ORDER — ALCOHOL 62% (NOZIN) NASAL SANITIZER
62 % | Freq: Two times a day (BID) | CUTANEOUS | Status: DC
Start: 2018-06-09 — End: 2018-06-12
  Administered 2018-06-10 – 2018-06-12 (×6): via TOPICAL

## 2018-06-09 MED ORDER — PHENYLEPHRINE 10 MG/ML INJECTION
10 mg/mL | INTRAMUSCULAR | Status: DC | PRN
Start: 2018-06-09 — End: 2018-06-09
  Administered 2018-06-09 (×3): via INTRAVENOUS

## 2018-06-09 MED ORDER — THROMBIN (BOVINE) 5,000 UNIT TOPICAL SOLUTION
5000 unit | CUTANEOUS | Status: DC | PRN
Start: 2018-06-09 — End: 2018-06-09
  Administered 2018-06-09 (×2): via TOPICAL

## 2018-06-09 MED ORDER — CEFAZOLIN 2 GRAM/20 ML IN STERILE WATER INTRAVENOUS SYRINGE
2 gram/0 mL | Freq: Three times a day (TID) | INTRAVENOUS | Status: AC
Start: 2018-06-09 — End: 2018-06-10
  Administered 2018-06-10 (×3): via INTRAVENOUS

## 2018-06-09 MED ORDER — ACETAMINOPHEN 1,000 MG/100 ML (10 MG/ML) IV
1000 mg/100 mL (10 mg/mL) | INTRAVENOUS | Status: AC
Start: 2018-06-09 — End: ?

## 2018-06-09 MED ORDER — SODIUM CHLORIDE 0.9 % IV
50000 unit | INTRAVENOUS | Status: DC | PRN
Start: 2018-06-09 — End: 2018-06-09
  Administered 2018-06-09: 16:00:00

## 2018-06-09 MED ORDER — DEXAMETHASONE SODIUM PHOSPHATE 4 MG/ML IJ SOLN
4 mg/mL | INTRAMUSCULAR | Status: DC | PRN
Start: 2018-06-09 — End: 2018-06-09
  Administered 2018-06-09: 16:00:00 via INTRAVENOUS

## 2018-06-09 MED ORDER — EPHEDRINE 50 MG/5 ML (10 MG/ML) IN NS IV SYRINGE
50 mg/5 mL (10 mg/mL) | INTRAVENOUS | Status: DC | PRN
Start: 2018-06-09 — End: 2018-06-09
  Administered 2018-06-09 (×2): via INTRAVENOUS

## 2018-06-09 MED ORDER — FENTANYL CITRATE (PF) 50 MCG/ML IJ SOLN
50 mcg/mL | Freq: Once | INTRAMUSCULAR | Status: DC
Start: 2018-06-09 — End: 2018-06-09

## 2018-06-09 MED ORDER — ALCOHOL 62% (NOZIN) NASAL SANITIZER
62 % | Freq: Once | CUTANEOUS | Status: AC
Start: 2018-06-09 — End: 2018-06-09
  Administered 2018-06-09: 15:00:00 via TOPICAL

## 2018-06-09 MED ORDER — LIDOCAINE (PF) 20 MG/ML (2 %) IJ SOLN
20 mg/mL (2 %) | INTRAMUSCULAR | Status: DC | PRN
Start: 2018-06-09 — End: 2018-06-09
  Administered 2018-06-09: 16:00:00 via INTRAVENOUS

## 2018-06-09 MED ORDER — THROMBIN (BOVINE) 5,000 UNIT TOPICAL SOLUTION
5000 unit | CUTANEOUS | Status: AC
Start: 2018-06-09 — End: ?

## 2018-06-09 MED ORDER — KETAMINE 50 MG/ML IJ SOLN
50 mg/mL | INTRAMUSCULAR | Status: DC | PRN
Start: 2018-06-09 — End: 2018-06-09
  Administered 2018-06-09: 16:00:00 via INTRAVENOUS

## 2018-06-09 MED ORDER — HYDROMORPHONE (PF) 1 MG/ML IJ SOLN
1 mg/mL | INTRAMUSCULAR | Status: DC | PRN
Start: 2018-06-09 — End: 2018-06-12
  Administered 2018-06-10: 09:00:00 via INTRAVENOUS

## 2018-06-09 MED ORDER — ROCURONIUM 10 MG/ML IV
10 mg/mL | INTRAVENOUS | Status: DC | PRN
Start: 2018-06-09 — End: 2018-06-09
  Administered 2018-06-09 (×3): via INTRAVENOUS

## 2018-06-09 MED ORDER — FENTANYL CITRATE (PF) 50 MCG/ML IJ SOLN
50 mcg/mL | INTRAMUSCULAR | Status: DC | PRN
Start: 2018-06-09 — End: 2018-06-09
  Administered 2018-06-09 (×3): via INTRAVENOUS

## 2018-06-09 MED ORDER — SODIUM CHLORIDE 0.9 % IJ SYRG
INTRAMUSCULAR | Status: DC | PRN
Start: 2018-06-09 — End: 2018-06-12

## 2018-06-09 MED ORDER — ALBUMIN, HUMAN 5 % IV
5 % | INTRAVENOUS | Status: DC | PRN
Start: 2018-06-09 — End: 2018-06-09
  Administered 2018-06-09 (×2): via INTRAVENOUS

## 2018-06-09 MED ORDER — MIDAZOLAM 1 MG/ML IJ SOLN
1 mg/mL | Freq: Once | INTRAMUSCULAR | Status: DC
Start: 2018-06-09 — End: 2018-06-09

## 2018-06-09 MED ORDER — HYDROCODONE-ACETAMINOPHEN 10 MG-325 MG TAB
10-325 mg | ORAL | Status: DC | PRN
Start: 2018-06-09 — End: 2018-06-12
  Administered 2018-06-10 – 2018-06-12 (×7): via ORAL

## 2018-06-09 MED ORDER — FENTANYL CITRATE (PF) 50 MCG/ML IJ SOLN
50 mcg/mL | INTRAMUSCULAR | Status: AC
Start: 2018-06-09 — End: ?

## 2018-06-09 MED ORDER — VANCOMYCIN 1,000 MG IV SOLR
1000 mg | INTRAVENOUS | Status: AC
Start: 2018-06-09 — End: ?

## 2018-06-09 MED ORDER — GLYCOPYRROLATE 0.2 MG/ML IJ SOLN
0.2 mg/mL | INTRAMUSCULAR | Status: DC | PRN
Start: 2018-06-09 — End: 2018-06-09
  Administered 2018-06-09: 19:00:00 via INTRAVENOUS

## 2018-06-09 MED ORDER — HYDROMORPHONE (PF) 2 MG/ML IJ SOLN
2 mg/mL | INTRAMUSCULAR | Status: DC | PRN
Start: 2018-06-09 — End: 2018-06-09

## 2018-06-09 MED ORDER — BUPIVACAINE-EPINEPHRINE (PF) 0.25 %-1:200,000 IJ SOLN
0.25 %-1:200,000 | INTRAMUSCULAR | Status: DC | PRN
Start: 2018-06-09 — End: 2018-06-09
  Administered 2018-06-09: 16:00:00 via SUBCUTANEOUS

## 2018-06-09 MED ORDER — LIDOCAINE HCL 1 % (10 MG/ML) IJ SOLN
10 mg/mL (1 %) | INTRAMUSCULAR | Status: DC | PRN
Start: 2018-06-09 — End: 2018-06-09

## 2018-06-09 MED ORDER — SODIUM CHLORIDE 0.9 % IJ SYRG
Freq: Three times a day (TID) | INTRAMUSCULAR | Status: DC
Start: 2018-06-09 — End: 2018-06-12
  Administered 2018-06-10 – 2018-06-12 (×10): via INTRAVENOUS

## 2018-06-09 MED ORDER — ALBUMIN, HUMAN 5 % IV
5 % | INTRAVENOUS | Status: AC
Start: 2018-06-09 — End: ?

## 2018-06-09 MED ORDER — ONDANSETRON (PF) 4 MG/2 ML INJECTION
4 mg/2 mL | INTRAMUSCULAR | Status: DC | PRN
Start: 2018-06-09 — End: 2018-06-09
  Administered 2018-06-09: 19:00:00 via INTRAVENOUS

## 2018-06-09 MED ORDER — SODIUM CHLORIDE 0.9 % IJ SYRG
INTRAMUSCULAR | Status: DC | PRN
Start: 2018-06-09 — End: 2018-06-09

## 2018-06-09 MED ORDER — CYCLOBENZAPRINE 10 MG TAB
10 mg | Freq: Three times a day (TID) | ORAL | Status: DC | PRN
Start: 2018-06-09 — End: 2018-06-12
  Administered 2018-06-10: 01:00:00 via ORAL

## 2018-06-09 MED ORDER — PROPOFOL 10 MG/ML IV EMUL
10 mg/mL | INTRAVENOUS | Status: DC | PRN
Start: 2018-06-09 — End: 2018-06-09
  Administered 2018-06-09: 16:00:00 via INTRAVENOUS

## 2018-06-09 MED ORDER — PANTOPRAZOLE 40 MG TAB, DELAYED RELEASE
40 mg | Freq: Every day | ORAL | Status: DC
Start: 2018-06-09 — End: 2018-06-12
  Administered 2018-06-10 – 2018-06-12 (×3): via ORAL

## 2018-06-09 MED ORDER — CEFAZOLIN 2 GRAM/20 ML IN STERILE WATER INTRAVENOUS SYRINGE
2 gram/0 mL | Freq: Once | INTRAVENOUS | Status: AC
Start: 2018-06-09 — End: 2018-06-09
  Administered 2018-06-09: 16:00:00 via INTRAVENOUS

## 2018-06-09 MED ORDER — SODIUM CHLORIDE 0.9 % IV
INTRAVENOUS | Status: DC | PRN
Start: 2018-06-09 — End: 2018-06-09
  Administered 2018-06-09: 17:00:00 via INTRAVENOUS

## 2018-06-09 MED ORDER — NALOXONE 0.4 MG/ML INJECTION
0.4 mg/mL | INTRAMUSCULAR | Status: DC | PRN
Start: 2018-06-09 — End: 2018-06-12

## 2018-06-09 MED ORDER — KETAMINE 50 MG/ML IJ SOLN
50 mg/mL | INTRAMUSCULAR | Status: AC
Start: 2018-06-09 — End: ?

## 2018-06-09 MED ORDER — FAMOTIDINE 20 MG TAB
20 mg | Freq: Once | ORAL | Status: AC
Start: 2018-06-09 — End: 2018-06-09
  Administered 2018-06-09: 15:00:00 via ORAL

## 2018-06-09 MED ORDER — LACTATED RINGERS IV
INTRAVENOUS | Status: DC
Start: 2018-06-09 — End: 2018-06-09

## 2018-06-09 MED ORDER — ACETAMINOPHEN 325 MG TABLET
325 mg | ORAL | Status: DC | PRN
Start: 2018-06-09 — End: 2018-06-12

## 2018-06-09 MED ORDER — BENZOCAINE-MENTHOL 15 MG-2.6 MG LOZENGES
Status: DC | PRN
Start: 2018-06-09 — End: 2018-06-12

## 2018-06-09 MED ORDER — PROMETHAZINE 25 MG/ML INJECTION
25 mg/mL | INTRAMUSCULAR | Status: DC | PRN
Start: 2018-06-09 — End: 2018-06-09

## 2018-06-09 MED ORDER — CEFAZOLIN 2 GRAM/20 ML IN STERILE WATER INTRAVENOUS SYRINGE
2 gram/0 mL | Freq: Three times a day (TID) | INTRAVENOUS | Status: DC
Start: 2018-06-09 — End: 2018-06-09

## 2018-06-09 MED ORDER — LACTATED RINGERS IV
INTRAVENOUS | Status: DC
Start: 2018-06-09 — End: 2018-06-09
  Administered 2018-06-09 (×2): via INTRAVENOUS

## 2018-06-09 MED ORDER — BACITRACIN 50,000 UNIT IM
50000 unit | INTRAMUSCULAR | Status: AC
Start: 2018-06-09 — End: ?

## 2018-06-09 MED ORDER — NEOSTIGMINE METHYLSULFATE 3 MG/3 ML (1 MG/ML) IV SYRINGE
3 mg/ mL (1 mg/mL) | INTRAVENOUS | Status: DC | PRN
Start: 2018-06-09 — End: 2018-06-09
  Administered 2018-06-09: 19:00:00 via INTRAVENOUS

## 2018-06-09 MED ORDER — VANCOMYCIN 1,000 MG IV SOLR
1000 mg | INTRAVENOUS | Status: DC | PRN
Start: 2018-06-09 — End: 2018-06-09
  Administered 2018-06-09: 16:00:00 via TOPICAL

## 2018-06-09 MED ORDER — PHENOL 1.4 % MUCOSAL AEROSOL SPRAY
1.4 % | Status: DC | PRN
Start: 2018-06-09 — End: 2018-06-09

## 2018-06-09 MED ORDER — SODIUM CHLORIDE 0.9 % IJ SYRG
Freq: Three times a day (TID) | INTRAMUSCULAR | Status: DC
Start: 2018-06-09 — End: 2018-06-09

## 2018-06-09 MED FILL — BUMINATE 5 % INTRAVENOUS SOLUTION: 5 % | INTRAVENOUS | Qty: 250

## 2018-06-09 MED FILL — THROMBIN-JMI 5,000 UNIT TOPICAL SOLUTION: 5000 unit | CUTANEOUS | Qty: 10000

## 2018-06-09 MED FILL — CEPACOL SORE THROAT (BENZOCAINE-MENTHOL) 15 MG-2.6 MG LOZENGES: Qty: 16

## 2018-06-09 MED FILL — FENTANYL CITRATE (PF) 50 MCG/ML IJ SOLN: 50 mcg/mL | INTRAMUSCULAR | Qty: 2

## 2018-06-09 MED FILL — OFIRMEV 1,000 MG/100 ML (10 MG/ML) INTRAVENOUS SOLUTION: 1000 mg/100 mL (10 mg/mL) | INTRAVENOUS | Qty: 100

## 2018-06-09 MED FILL — BACITRACIN 50,000 UNIT IM: 50000 unit | INTRAMUSCULAR | Qty: 50000

## 2018-06-09 MED FILL — KETAMINE 50 MG/ML IJ SOLN: 50 mg/mL | INTRAMUSCULAR | Qty: 10

## 2018-06-09 MED FILL — CYCLOBENZAPRINE 10 MG TAB: 10 mg | ORAL | Qty: 1

## 2018-06-09 MED FILL — CEFAZOLIN 2 GRAM/20 ML IN STERILE WATER INTRAVENOUS SYRINGE: 2 gram/0 mL | INTRAVENOUS | Qty: 20

## 2018-06-09 MED FILL — FAMOTIDINE 20 MG TAB: 20 mg | ORAL | Qty: 1

## 2018-06-09 MED FILL — ALCOHOL 62% (NOZIN) NASAL SANITIZER: 62 % | CUTANEOUS | Qty: 3

## 2018-06-09 MED FILL — VANCOMYCIN 1,000 MG IV SOLR: 1000 mg | INTRAVENOUS | Qty: 1000

## 2018-06-09 NOTE — Anesthesia Post-Procedure Evaluation (Signed)
Procedure(s):  L2-L5 LAMINECTOMY FOR DECOMPRESSION.    general    Anesthesia Post Evaluation      Multimodal analgesia: multimodal analgesia not used between 6 hours prior to anesthesia start to PACU discharge  Patient location during evaluation: PACU  Patient participation: complete - patient participated  Level of consciousness: awake and alert  Pain management: adequate  Airway patency: patent  Anesthetic complications: no  Cardiovascular status: hemodynamically stable  Respiratory status: acceptable  Hydration status: acceptable        Vitals Value Taken Time   BP 152/75 06/09/2018  3:49 PM   Temp 37.1 ??C (98.8 ??F) 06/09/2018  3:20 PM   Pulse 85 06/09/2018  4:02 PM   Resp 16 06/09/2018  3:49 PM   SpO2 96 % 06/09/2018  4:02 PM   Vitals shown include unvalidated device data.

## 2018-06-09 NOTE — Progress Notes (Signed)
..  TRANSFER - IN REPORT:    Verbal report received from Sherwood  on Matthew Sherman  being received from surgery.      Report consisted of patient???s Situation, Background, Assessment and   Recommendations.     Information from the following report  was reviewed with the receiving nurse.    Opportunity for questions and clarification was provided.      Assessment completed upon patient???s arrival to unit and care assumed.

## 2018-06-09 NOTE — Anesthesia Pre-Procedure Evaluation (Signed)
Relevant Problems   No relevant active problems       Anesthetic History               Review of Systems / Medical History  Patient summary reviewed and pertinent labs reviewed    Pulmonary                   Neuro/Psych              Cardiovascular    Hypertension: well controlled              Exercise tolerance: <4 METS     GI/Hepatic/Renal                Endo/Other             Other Findings              Physical Exam    Airway  Mallampati: II  TM Distance: > 6 cm  Neck ROM: normal range of motion   Mouth opening: Normal     Cardiovascular  Regular rate and rhythm,  S1 and S2 normal,  no murmur, click, rub, or gallop  Rhythm: regular  Rate: normal         Dental    Dentition: Full lower dentures and Full upper dentures     Pulmonary  Breath sounds clear to auscultation               Abdominal         Other Findings            Anesthetic Plan    ASA: 2  Anesthesia type: general            Anesthetic plan and risks discussed with: Patient

## 2018-06-09 NOTE — Op Note (Addendum)
NEUROSURGERY OPERATIVE REPORT:    Patient: Matthew Sherman    Patient MRN: [id number]    Procedure Date: 06/09/2018     Patient Date of Birth: [birth year]    Pre-Operative Diagnosis:   1. Lumbar spinal stenosis with neurogenic claudication L2-L5    Post-Operative Diagnosis SAME AS ABOVE    Procedure:   1. Lumbar Laminectomy, medial facetectomy and foraminotomy L2-L3, L3-L4 and L4-L5    Anesthesia: General Endotracheal Anesthesia    Blood Loss: 1000 cc    Surgeon: Lurene Shadow, MD     Surgical Staff:   See Operative Report     Procedure in Detail:   The patient was transported to the OR and placed under general endotracheal anesthesia. The patient was positioned in the prone position and padded appropriately. A surgical pause time-out was performed at the beginning of the case prior to incision confirming procedure, sterility and functionality of instruments, surgical site, stability of patient, and anti-biotic administration. Patient was prepped and draped in a sterile fashion with Sage Wipe, 70% isopropyl alcohol and two Chloraprep sticks. A midline lumbar incision was made to expose the lumbosacral fascia. The fascia was opened in the midline and a subperiosteal dissection exposed the spinous processes and lamina from L2 to L5 and intraoperative C-arm fluoroscopic imaging was used to identify the appropriate level. Spinous process and lamina of L4 was then removed using Leksell rongeurs, Kerrison rongeurs, and a high speed drill. The hypertrophic ligamentum flavum was mobilized and removed bilateral medial facetectomy and foraminotomy were completed at L4-L5. The facet joints were undercut, decompressing the exiting and traversing nerve roots. A ball probe was then passed into the epidural space to verify that the nerve roots were free of compression.  Local hemostasis was obtained. Decompression was repeated in the same fashion at L3-L4 and L2-L3. The leading edge of L5 was removed with  Leksell rongeurs, Kerrison rongeurs and high speed drill. The spinous process of L5 was left in place. The soft tissue retractors were removed. Hemostasis was obtained.  The wound was copiously irrigated with bacitracin impregnated irrigation and vancomycin powder was instilled into the wound.  A JP drain was brought out through separate stab wound from below the deep fascia the fascia was closed over the JP drain with interrupted zero Vicryls. The subcutaneous tissue was reapproximated with 2-0 Vicryls interrupted subdermal sutures. The skin was closed with staples. All counts were correct throughout the entirety of the case. Patient was allowed to recover from general anesthesia. Patient tolerated the procedure well and was taken to the recovery room.     Disposition: PACU and from PACU to the surgical floor.     Demica Zook R. Bradd Canary, [place]

## 2018-06-09 NOTE — Other (Signed)
Dr Donnetta Hail by to see pt, OK to go to floor

## 2018-06-09 NOTE — H&P (Signed)
NEUROSURGERY H&P NOTE:     CC: Complains that his feet feel cold and wet and that he has straining or cramping in his bilateral lower extremities.    HPI:   Matthew Sherman is a 77 y.o. male who presents today with of complaints of his feet feeling cold and wet as well as straining or cramping in his lower extremities.  He was evaluated in and determined to have symptoms and findings consistent with lumbar spinal stenosis with neurogenic claudication. He state most recently his legs gave out on him at a wedding. He failed conservative medical management. The patient has an MRI lumbar spine without contrast performed on 04/07/2018 that demonstrates multilevel lumbar spinal stenosis.  At L4-L5 there is severe central and lateral recess stenosis secondary to facet arthropathy and ligamentum flavum hypertrophy that results in almost a complete block at the L4-L5 level.  At L3-L4 there is severe lateral recess and central spinal stenosis secondary to facet arthropathy ligamentum flavum hypertrophy and epidural lipomatosis and at L2-L3 there is also severe central lateral recess stenosis secondary to facet arthropathy and ligamentum flavum hypertrophy.  There is moderate left neuroforaminal stenosis at L2-L3, severe right-sided neuroforaminal stenosis at L4-L5 moderate bilateral neuroforaminal stenosis at L3-L4, moderate bilateral neuroforaminal stenosis at L5 and S1.    Past Medical History:   Diagnosis Date   ??? Hypertension      Past Surgical History:   Procedure Laterality Date   ??? HX APPENDECTOMY  1956   ??? HX CATARACT REMOVAL Bilateral     right done 2018, left 2002   ??? HX TONSIL AND ADENOIDECTOMY       No Known Allergies  Social History     Socioeconomic History   ??? Marital status: MARRIED     Spouse name: Not on file   ??? Number of children: Not on file   ??? Years of education: Not on file   ??? Highest education level: Not on file   Occupational History   ??? Not on file   Social Needs    ??? Financial resource strain: Not on file   ??? Food insecurity:     Worry: Not on file     Inability: Not on file   ??? Transportation needs:     Medical: Not on file     Non-medical: Not on file   Tobacco Use   ??? Smoking status: Never Smoker   ??? Smokeless tobacco: Never Used   Substance and Sexual Activity   ??? Alcohol use: Never     Frequency: Never   ??? Drug use: Never   ??? Sexual activity: Not Currently     Partners: Female   Lifestyle   ??? Physical activity:     Days per week: Not on file     Minutes per session: Not on file   ??? Stress: Not on file   Relationships   ??? Social connections:     Talks on phone: Not on file     Gets together: Not on file     Attends religious service: Not on file     Active member of club or organization: Not on file     Attends meetings of clubs or organizations: Not on file     Relationship status: Not on file   ??? Intimate partner violence:     Fear of current or ex partner: Not on file     Emotionally abused: Not on file     Physically  abused: Not on file     Forced sexual activity: Not on file   Other Topics Concern   ??? Not on file   Social History Narrative   ??? Not on file     Review of Systems - Negative except as noted per HPI     Physical Exam:   Visit Vitals  BP (!) 201/97 (BP 1 Location: Right arm, BP Patient Position: At rest)   Pulse 92   Temp 99.1 ??F (37.3 ??C)   Resp 18   Ht 6' (1.829 m)   Wt 212 lb 6.4 oz (96.3 kg)   SpO2 97%   BMI 28.81 kg/m??   General: No acute distress  Mood and affect appropriate   Head normocephalic and atraumatic  Skin warm and dry  CV: Regular rate and rhythm   Resp: No increased work of breathing  Awake, alert, and oriented    Eyes open spontaneously .   Hearing grossly intact   Face symmetric and tongue mid-line on protrusion   No mid-line cervical, thoracic, or lumbar tenderness to palpation   Patient with strength exam as follows:   Upper Extremities:      Right    Left  ??                          Deltoid             5          5                           Biceps             5          5                          Triceps            5          5                          Grip                 5          5  ??              Lower Extremities:  ??                          Hip Flex           5          5                          Quads              5          5                          Hamstrings      5          5                          Dorsiflex  5          5                          Plantarflex       5          5                          EHL                 5          5  Sensation grossly intact light touch  No clonus or babinski present   Gait: deferred as he is in Pre-op Hold     Assessment and Plan:   Matthew Sherman is a 77 y.o. male who presents today with of complaints of his feet feeling cold and wet as well as straining or cramping in his lower extremities.  He was evaluated in and determined to have symptoms and findings consistent with lumbar spinal stenosis with neurogenic claudication. He state most recently his legs gave out on him at a wedding. He failed conservative medical management. The patient has an MRI lumbar spine without contrast performed on 04/07/2018 that demonstrates multilevel lumbar spinal stenosis.  At L4-L5 there is severe central and lateral recess stenosis secondary to facet arthropathy and ligamentum flavum hypertrophy that results in almost a complete block at the L4-L5 level.  At L3-L4 there is severe lateral recess and central spinal stenosis secondary to facet arthropathy ligamentum flavum hypertrophy and epidural lipomatosis and at L2-L3 there is also severe central lateral recess stenosis secondary to facet arthropathy and ligamentum flavum hypertrophy.  There is moderate left neuroforaminal stenosis at L2-L3, severe right-sided neuroforaminal stenosis at L4-L5 moderate bilateral neuroforaminal stenosis at L3-L4, moderate bilateral  neuroforaminal stenosis at L5 and S1. OR today for L2-L5 lumbar laminectomies for decompression. Plan to admit to the hospital for post-operative convalescence following the surgery.     Landy Mace R. Bradd Canary, [place]

## 2018-06-09 NOTE — Other (Signed)
TRANSFER - OUT REPORT:    Verbal report given to [employer] on Matthew Sherman  being transferred to 629 for routine post - op       Report consisted of patient???s Situation, Background, Assessment and   Recommendations(SBAR).     Information from the following report(s) SBAR, OR Summary, Procedure Summary, Intake/Output, MAR, Recent Results and Cardiac Rhythm Sinus Rhythm with 1st degree AVB and BBB was reviewed with the receiving nurse.    Opportunity for questions and clarification was provided.      Patient transported with:   O2 @ 2L/NC liters

## 2018-06-09 NOTE — Progress Notes (Signed)
Transfer of care.   Report received from off going RN at bedside. Patient is awake, lying in bed and family??at bedside visiting.??Oriented patient to staff and plan of care. Patient is alert and oriented x 4, S/P??L2 - L5 laminectomy for decompression. Patient is lying in bed??with even and unlabored respirations noted on 2 L oxygen via NC. No needs voiced at this moment. Assessment to follow, see for details.

## 2018-06-09 NOTE — Progress Notes (Signed)
Problem: Pain  Goal: *Control of Pain  Outcome: Progressing Towards Goal     Problem: Patient Education: Go to Patient Education Activity  Goal: Patient/Family Education  Outcome: Progressing Towards Goal     Problem: Impaired Skin Integrity/Pressure Injury Treatment  Goal: *Improvement of Existing Pressure Injury  Outcome: Progressing Towards Goal     Problem: Patient Education: Go to Patient Education Activity  Goal: Patient/Family Education  Outcome: Progressing Towards Goal

## 2018-06-09 NOTE — Other (Signed)
Pt awaiting bed placement, Dr Donnetta Hail aware of 1st degree AVB and BBB.

## 2018-06-09 NOTE — Progress Notes (Signed)
06/09/18 1600   Dual Skin Pressure Injury Assessment   Dual Skin Pressure Injury Assessment X   Second Care Provider (Based on [place]) Kristine, RN   Skin Integumentary   Skin Integumentary (WDL) X   Skin Color Pink   Skin Condition/Temp Dry;Warm   Skin Integrity Incision (comment)  (Mid back with hemovac drain)   Wound Prevention and Protection Methods   Orientation of Wound Prevention Posterior   Location of Wound Prevention Sacrum/Coccyx   Dressing Present  No     Dual skin assessment completed with Carmell Austria, RN

## 2018-06-10 LAB — CBC W/O DIFF
ABSOLUTE NRBC: 0 10*3/uL (ref 0.0–0.2)
HCT: 43.3 % (ref 41.1–50.3)
HGB: 14.2 g/dL (ref 13.6–17.2)
MCH: 29.5 PG (ref 26.1–32.9)
MCHC: 32.8 g/dL (ref 31.4–35.0)
MCV: 89.8 FL (ref 79.6–97.8)
MPV: 9.6 FL (ref 9.4–12.3)
PLATELET: 183 10*3/uL (ref 150–450)
RBC: 4.82 M/uL (ref 4.23–5.6)
RDW: 13.1 % (ref 11.9–14.6)
WBC: 12.3 10*3/uL — ABNORMAL HIGH (ref 4.3–11.1)

## 2018-06-10 MED FILL — ALCOHOL 62% (NOZIN) NASAL SANITIZER: 62 % | CUTANEOUS | Qty: 1

## 2018-06-10 MED FILL — HYDROCODONE-ACETAMINOPHEN 10 MG-325 MG TAB: 10-325 mg | ORAL | Qty: 1

## 2018-06-10 MED FILL — CEFAZOLIN 2 GRAM/20 ML IN STERILE WATER INTRAVENOUS SYRINGE: 2 gram/0 mL | INTRAVENOUS | Qty: 20

## 2018-06-10 MED FILL — HYDROMORPHONE (PF) 1 MG/ML IJ SOLN: 1 mg/mL | INTRAMUSCULAR | Qty: 1

## 2018-06-10 MED FILL — PANTOPRAZOLE 40 MG TAB, DELAYED RELEASE: 40 mg | ORAL | Qty: 1

## 2018-06-10 NOTE — Progress Notes (Signed)
Initial visit to assess pt's spiritual needs.      Feeling today?  ok    Receiving good care?  yes    Needs from Spiritual Care:  Nothing now    Ministry of presence & prayer to demonstrate caring & concern, convey emotional & spiritual support.    Chaplain Jasmine Awe, MDiv,ThM,PhD

## 2018-06-10 NOTE — Progress Notes (Signed)
Problem: Mobility Impaired (Adult and Pediatric)  Goal: *Acute Goals and Plan of Care (Insert Text)  Description  LTG:  (1.)Matthew Sherman will move from supine to sit and sit to supine , scoot up and down and roll side to side with MODIFIED INDEPENDENCE within 7 treatment day(s).    (2.)Matthew Sherman will transfer from bed to chair and chair to bed with SUPERVISION using the least restrictive device within 7 treatment day(s).    (3.)Matthew Sherman will ambulate with SUPERVISION for 250+ feet with the least restrictive device within 7 treatment day(s).   (4.)Matthew Sherman will ascend and descend 3 steps with stand by assist or min handheld assist within 7 days.  ________________________________________________________________________________________________   Outcome: Progressing Towards Goal     PHYSICAL THERAPY: Daily Note and PM 06/10/2018  OUTPATIENT: PT Visit Days : 1  Payor: [employer] MEDICARE / Plan: [place] PPO/PFFS / Product Type: Managed Care Medicare /       NAME/AGE/GENDER: Matthew Sherman is a 77 y.o. male   PRIMARY DIAGNOSIS: Lumbar stenosis with neurogenic claudication [M48.062]  Spinal stenosis of lumbar region with neurogenic claudication [M48.062] <principal problem not specified>   <principal problem not specified>    Procedure(s) (LRB):  L2-L5 LAMINECTOMY FOR DECOMPRESSION (N/A)  1 Day Post-Op  ICD-10: Treatment Diagnosis:    ?? Generalized Muscle Weakness (M62.81)  ?? Difficulty in walking, Not elsewhere classified (R26.2)  ?? Other abnormalities of gait and mobility (R26.89)  ?? Low Back Pain (M54.5)   Precaution/Allergies:  Patient has no known allergies.      ASSESSMENT:     Matthew Sherman is a 77 year old male seen for initial therapy evaluation following above procedure. At baseline he lives with spouse and is fully independent without use of any DME. On fall 3-4 months ago.     11/13- pt presents in supine with continued post op soreness, improved  compared to this morning. He performs log roll and bed mobility with SBA/min verbal cues. Intact seated balance. CGA/SBA for sit-stand transfer. Pt able to increase gait distance compared to this morning and does not need constant handheld assist, only CGA/SBA for safety. Demonstrates slow, slightly unsteady gait with mild trunk sway at times, no major loss of balance noted. Verbal cues for gait safety. Returned to room and pt back to bed at his request as he states he had just returned to bed after eating lunch when therapist arrived. Sit to supine transfer with SBA and good technique. Left comfortable in bed with family present, needs in reach. Good progress this afternoon. Plans for home with family at dc, [facility].     This section established at most recent assessment   PROBLEM LIST (Impairments causing functional limitations):  1. Decreased Strength  2. Decreased Transfer Abilities  3. Decreased Ambulation Ability/Technique  4. Decreased Balance  5. Increased Pain  6. Decreased Activity Tolerance   INTERVENTIONS PLANNED: (Benefits and precautions of physical therapy have been discussed with the patient.)  1. Balance Exercise  2. Bed Mobility  3. Group Therapy  4. Home Exercise Program (HEP)  5. Therapeutic Activites  6. Therapeutic Exercise/Strengthening  7. Transfer Training     TREATMENT PLAN: Frequency/Duration: twice daily for duration of hospital stay  Rehabilitation Potential For Stated Goals: Good     REHAB RECOMMENDATIONS (at time of discharge pending progress):    Placement:  It is my opinion, based on this patient's performance to date, that Matthew Sherman may benefit from  HOME HEALTH THERAPY after discharge due to the functional deficits listed above that are likely to improve with skilled rehabilitation because he/she has multiple medical issues that affect his/her functional mobility in the community.  Equipment:   ? tbd               HISTORY:   History of Present Injury/Illness (Reason for Referral):   Per H&P, "Matthew Sherman is a 77 y.o. male who presents today with of complaints of his feet feeling cold and wet as well as straining or cramping in his lower extremities.  He was evaluated in and determined to have symptoms and findings consistent with lumbar spinal stenosis with neurogenic claudication. He state most recently his legs gave out on him at a wedding. He failed conservative medical management. The patient has an MRI lumbar spine without contrast performed on 04/07/2018 that demonstrates multilevel lumbar spinal stenosis.  At L4-L5 there is severe central and lateral recess stenosis secondary to facet arthropathy and ligamentum flavum hypertrophy that results in almost a complete block at the L4-L5 level.  At L3-L4 there is severe lateral recess and central spinal stenosis secondary to facet arthropathy ligamentum flavum hypertrophy and epidural lipomatosis and at L2-L3 there is also severe central lateral recess stenosis secondary to facet arthropathy and ligamentum flavum hypertrophy.  There is moderate left neuroforaminal stenosis at L2-L3, severe right-sided neuroforaminal stenosis at L4-L5 moderate bilateral neuroforaminal stenosis at L3-L4, moderate bilateral neuroforaminal stenosis at L5 and S1."    Past Medical History/Comorbidities:   Matthew Sherman  has a past medical history of Hypertension.  Matthew Sherman  has a past surgical history that includes hx appendectomy (1956); hx cataract removal (Bilateral); and hx tonsil and adenoidectomy.  Social History/Living Environment:   Home Environment: Private residence  # Steps to Enter: 3  Rails to Enter: No  Wheelchair Ramp: No  One/Two Story Residence: One story  Living Alone: No  Support Systems: Copy  Patient Expects to be Discharged to:: Private residence  Current DME Used/Available at Home: None  Prior Level of Function/Work/Activity:  Lives with wife. Independent baseline, no DME use. Drives. 3-4 recent falls.       Number of Personal Factors/Comorbidities that affect the Plan of Care: 1-2: MODERATE COMPLEXITY   EXAMINATION:   Most Recent Physical Functioning:   Gross Assessment:  AROM: Within functional limits  Strength: Generally decreased, functional  Coordination: Within functional limits  Sensation: Intact               Posture:  Posture (WDL): Exceptions to WDL  Posture Assessment: Forward head, Rounded shoulders  Balance:  Sitting: Intact  Sitting - Static: Fair (occasional)  Sitting - Dynamic: Fair (occasional)  Standing: Impaired  Standing - Static: Fair(+)  Standing - Dynamic : Fair(+) Bed Mobility:  Rolling: Stand-by assistance  Supine to Sit: Stand-by assistance  Sit to Supine: Stand-by assistance  Scooting: Stand-by assistance  Wheelchair Mobility:     Transfers:  Sit to Stand: Stand-by assistance  Stand to Sit: Stand-by assistance  Bed to Chair: Stand-by assistance  Interventions: Verbal cues;Safety awareness training  Duration: 10 Minutes  Gait:     Base of Support: Center of gravity altered  Speed/Cadence: Pace decreased (<100 feet/min);Fluctuations  Step Length: Left shortened;Right shortened  Gait Abnormalities: Trunk sway increased;Decreased step clearance;Path deviations  Distance (ft): 270 Feet (ft)  Assistive Device: (none)  Ambulation - Level of Assistance: Contact guard assistance;Stand-by assistance  Interventions: Verbal cues;Safety awareness training  Body Structures Involved:  1. Bones  2. Joints  3. Muscles Body Functions Affected:  1. Sensory/Pain  2. Movement Related Activities and Participation Affected:  1. General Tasks and Demands  2. Mobility  3. Domestic Life  4. Community, Social and [employer]   Number of elements that affect the Plan of Care: 4+: HIGH COMPLEXITY   CLINICAL PRESENTATION:   Presentation: Stable and uncomplicated: LOW COMPLEXITY   CLINICAL DECISION MAKING:   [place]??? ???6 Clicks???   Basic Mobility Inpatient Short Form   How much difficulty does the patient currently have... Unable A Lot A Little None   1.  Turning over in bed (including adjusting bedclothes, sheets and blankets)?   ? 1   ? 2   ? 3   ? 4   2.  Sitting down on and standing up from a chair with arms ( e.g., wheelchair, bedside commode, etc.)   ? 1   ? 2   ? 3   ? 4   3.  Moving from lying on back to sitting on the side of the bed?   ? 1   ? 2   ? 3   ? 4   How much help from another person does the patient currently need... Total A Lot A Little None   4.  Moving to and from a bed to a chair (including a wheelchair)?   ? 1   ? 2   ? 3   ? 4   5.  Need to walk in hospital room?   ? 1   ? 2   ? 3   ? 4   6.  Climbing 3-5 steps with a railing?   ? 1   ? 2   ? 3   ? 4   ?? 2007, [employer], under license to [place]. All rights reserved      Score:  Initial: 19 Most Recent: X (Date: -- )    Interpretation of Tool:  Represents activities that are increasingly more difficult (i.e. Bed mobility, Transfers, Gait).    Medical Necessity:     ?? Patient demonstrates good  ??  rehab potential due to higher previous functional level.  Reason for Services/Other Comments:  ?? Patient continues to demonstrate capacity to improve strength, mobility, balance, transfers, activity tolerance which will increase independence and increase safety  ?? .   Use of outcome tool(s) and clinical judgement create a POC that gives a: Clear prediction of patient's progress: LOW COMPLEXITY            TREATMENT:   (In addition to Assessment/Re-Assessment sessions the following treatments were rendered)   Pre-treatment Symptoms/Complaints:  "I just got back in bed after sitting up for lunch"  Pain: Initial:   Pain Intensity 1: 2  Pain Location 1: Back  Pain Intervention(s) 1: Repositioned  Post Session:  2/10     Therapeutic Activity: (  10 Minutes ):  Therapeutic activities including Bed mobility with instruction/education on log roll technique, body  mechanics, and spinal precautions, sit-stand transfers, standing activities, Ambulation on level ground and review of post op mobility/plan of care to improve mobility, strength, balance and activity tolerance .  Required minimal Verbal cues;Safety awareness training to promote static and dynamic balance in standing and promote motor control of bilateral, lower extremity(s).       Braces/Orthotics/Lines/Etc:   ?? drain hemovac   ?? O2 Device: Room air  Treatment/Session Assessment:    ??  Response to Treatment: 250 ft no DME, CGA/SBA and verbal cues  ?? Interdisciplinary Collaboration:   o Physical Therapist  o Registered Nurse  ?? After treatment position/precautions:   o Supine in bed  o Bed/Chair-wheels locked  o Bed in low position  o Call light within reach  o Family at bedside   ?? Compliance with Program/Exercises: Will assess as treatment progresses  ?? Recommendations/Intent for next treatment session:  "Next visit will focus on advancements to more challenging activities and reduction in assistance provided".  Total Treatment Duration:  PT Patient Time In/Time Out  Time In: 1353  Time Out: [place], DPT

## 2018-06-10 NOTE — Progress Notes (Addendum)
Problem: Mobility Impaired (Adult and Pediatric)  Goal: *Acute Goals and Plan of Care (Insert Text)  Description  LTG:  (1.)Matthew Sherman will move from supine to sit and sit to supine , scoot up and down and roll side to side with MODIFIED INDEPENDENCE within 7 treatment day(s).    (2.)Matthew Sherman will transfer from bed to chair and chair to bed with SUPERVISION using the least restrictive device within 7 treatment day(s).    (3.)Matthew Sherman will ambulate with SUPERVISION for 250+ feet with the least restrictive device within 7 treatment day(s).   (4.)Matthew Sherman will ascend and descend 3 steps with stand by assist or min handheld assist within 7 days.  ________________________________________________________________________________________________   Outcome: Progressing Towards Goal     PHYSICAL THERAPY: Initial Assessment and AM 06/10/2018  OUTPATIENT: PT Visit Days : 1  Payor: [employer] MEDICARE / Plan: [place] PPO/PFFS / Product Type: Managed Care Medicare /       NAME/AGE/GENDER: Matthew Sherman is a 77 y.o. male   PRIMARY DIAGNOSIS: Lumbar stenosis with neurogenic claudication [M48.062]  Spinal stenosis of lumbar region with neurogenic claudication [M48.062] <principal problem not specified>   <principal problem not specified>    Procedure(s) (LRB):  L2-L5 LAMINECTOMY FOR DECOMPRESSION (N/A)  1 Day Post-Op  ICD-10: Treatment Diagnosis:    ?? Generalized Muscle Weakness (M62.81)  ?? Difficulty in walking, Not elsewhere classified (R26.2)  ?? Other abnormalities of gait and mobility (R26.89)  ?? Low Back Pain (M54.5)   Precaution/Allergies:  Patient has no known allergies.      ASSESSMENT:     Matthew Sherman is a 77 year old male seen for initial therapy evaluation following above procedure. At baseline he lives with spouse and is fully independent without use of any DME. On fall 3-4 months ago. Pt presents in supine with c/o expected post op pain and soreness. Educated on post op  mobility, spinal precautions, and log roll technique. Performs bed mobility/transfer to sitting with SBA. Intact seated balance noted. Cueing for sit-stand transfer technique and body mechanics. Stood with CGA-min assist. Ambulates total of 120 ft in hallway with constant min handheld assist due to generalized weakness and balance deficits. Demonstrates narrow base of support and some unsteadiness. Returned to room and up to recliner after activity with spouse present, needs in reach. Matthew Sherman is demonstrating expected post op deficits with strength, mobility, balance, transfers, and activity tolerance and is limited by post op pain. He will benefit from continued therapy during hospital stay to address deficits/maximize independence with mobility. Pt plans to dc home with wife when medically stable, would benefit from [facility] PT.     This section established at most recent assessment   PROBLEM LIST (Impairments causing functional limitations):  1. Decreased Strength  2. Decreased Transfer Abilities  3. Decreased Ambulation Ability/Technique  4. Decreased Balance  5. Increased Pain  6. Decreased Activity Tolerance   INTERVENTIONS PLANNED: (Benefits and precautions of physical therapy have been discussed with the patient.)  1. Balance Exercise  2. Bed Mobility  3. Group Therapy  4. Home Exercise Program (HEP)  5. Therapeutic Activites  6. Therapeutic Exercise/Strengthening  7. Transfer Training     TREATMENT PLAN: Frequency/Duration: twice daily for duration of hospital stay  Rehabilitation Potential For Stated Goals: Good     REHAB RECOMMENDATIONS (at time of discharge pending progress):    Placement:  It is my opinion, based on this patient's performance to date, that  Matthew Sherman may benefit from [place] after discharge due to the functional deficits listed above that are likely to improve with skilled rehabilitation because he/she has multiple medical issues that affect  his/her functional mobility in the community.  Equipment:   ? tbd               HISTORY:   History of Present Injury/Illness (Reason for Referral):  Per H&P, "Matthew Sherman is a 77 y.o. male who presents today with of complaints of his feet feeling cold and wet as well as straining or cramping in his lower extremities.  He was evaluated in and determined to have symptoms and findings consistent with lumbar spinal stenosis with neurogenic claudication. He state most recently his legs gave out on him at a wedding. He failed conservative medical management. The patient has an MRI lumbar spine without contrast performed on 04/07/2018 that demonstrates multilevel lumbar spinal stenosis.  At L4-L5 there is severe central and lateral recess stenosis secondary to facet arthropathy and ligamentum flavum hypertrophy that results in almost a complete block at the L4-L5 level.  At L3-L4 there is severe lateral recess and central spinal stenosis secondary to facet arthropathy ligamentum flavum hypertrophy and epidural lipomatosis and at L2-L3 there is also severe central lateral recess stenosis secondary to facet arthropathy and ligamentum flavum hypertrophy.  There is moderate left neuroforaminal stenosis at L2-L3, severe right-sided neuroforaminal stenosis at L4-L5 moderate bilateral neuroforaminal stenosis at L3-L4, moderate bilateral neuroforaminal stenosis at L5 and S1."    Past Medical History/Comorbidities:   Matthew Sherman  has a past medical history of Hypertension.  Matthew Sherman  has a past surgical history that includes hx appendectomy (1956); hx cataract removal (Bilateral); and hx tonsil and adenoidectomy.  Social History/Living Environment:   Home Environment: Private residence  # Steps to Enter: 3  Rails to Enter: No  Wheelchair Ramp: No  One/Two Story Residence: One story  Living Alone: No  Support Systems: Copy  Patient Expects to be Discharged to:: Private residence   Current DME Used/Available at Home: None  Prior Level of Function/Work/Activity:  Lives with wife. Independent baseline, no DME use. Drives. 3-4 recent falls.      Number of Personal Factors/Comorbidities that affect the Plan of Care: 1-2: MODERATE COMPLEXITY   EXAMINATION:   Most Recent Physical Functioning:   Gross Assessment:  AROM: Within functional limits  Strength: Generally decreased, functional  Coordination: Within functional limits  Sensation: Intact               Posture:  Posture (WDL): Exceptions to WDL  Posture Assessment: Forward head, Rounded shoulders  Balance:  Sitting: Impaired  Sitting - Static: Fair (occasional)  Sitting - Dynamic: Fair (occasional)  Standing: Impaired  Standing - Static: Fair  Standing - Dynamic : Fair Bed Mobility:  Rolling: Contact guard assistance  Supine to Sit: Contact guard assistance  Scooting: Contact guard assistance  Wheelchair Mobility:     Transfers:  Sit to Stand: Contact guard assistance  Stand to Sit: Contact guard assistance  Bed to Chair: Contact guard assistance  Interventions: Verbal cues;Safety awareness training;Tactile cues  Duration: 8 Minutes  Gait:     Base of Support: Center of gravity altered  Speed/Cadence: Pace decreased (<100 feet/min);Slow  Step Length: Left shortened;Right shortened  Gait Abnormalities: Trunk sway increased;Decreased step clearance;Path deviations  Distance (ft): 120 Feet (ft)  Assistive Device: Other (comment)(min handheld assist)  Ambulation - Level of Assistance: Minimal assistance  Interventions: Verbal cues;Safety awareness training;Tactile cues      Body Structures Involved:  1. Bones  2. Joints  3. Muscles Body Functions Affected:  1. Sensory/Pain  2. Movement Related Activities and Participation Affected:  1. General Tasks and Demands  2. Mobility  3. Domestic Life  4. Community, Social and [employer]   Number of elements that affect the Plan of Care: 4+: HIGH COMPLEXITY   CLINICAL PRESENTATION:    Presentation: Stable and uncomplicated: LOW COMPLEXITY   CLINICAL DECISION MAKING:   [place]??? ???6 Clicks???   Basic Mobility Inpatient Short Form  How much difficulty does the patient currently have... Unable A Lot A Little None   1.  Turning over in bed (including adjusting bedclothes, sheets and blankets)?   ? 1   ? 2   ? 3   ? 4   2.  Sitting down on and standing up from a chair with arms ( e.g., wheelchair, bedside commode, etc.)   ? 1   ? 2   ? 3   ? 4   3.  Moving from lying on back to sitting on the side of the bed?   ? 1   ? 2   ? 3   ? 4   How much help from another person does the patient currently need... Total A Lot A Little None   4.  Moving to and from a bed to a chair (including a wheelchair)?   ? 1   ? 2   ? 3   ? 4   5.  Need to walk in hospital room?   ? 1   ? 2   ? 3   ? 4   6.  Climbing 3-5 steps with a railing?   ? 1   ? 2   ? 3   ? 4   ?? 2007, Trustees of [employer], under license to [employer]. All rights reserved      Score:  Initial: 19 Most Recent: X (Date: -- )    Interpretation of Tool:  Represents activities that are increasingly more difficult (i.e. Bed mobility, Transfers, Gait).    Medical Necessity:     ?? Patient demonstrates good  ??  rehab potential due to higher previous functional level.  Reason for Services/Other Comments:  ?? Patient continues to demonstrate capacity to improve strength, mobility, balance, transfers, activity tolerance which will increase independence and increase safety  ?? .   Use of outcome tool(s) and clinical judgement create a POC that gives a: Clear prediction of patient's progress: LOW COMPLEXITY            TREATMENT:   (In addition to Assessment/Re-Assessment sessions the following treatments were rendered)   Pre-treatment Symptoms/Complaints:  "I will try"  Pain: Initial:   Pain Intensity 1: 3  Pain Location 1: Back  Pain Intervention(s) 1: Repositioned, Ambulation/Increased Activity  Post Session:  2/10      Therapeutic Activity: (  8 Minutes ):  Therapeutic activities including Bed mobility with instruction/education on log roll technique, body mechanics, and spinal precautions, sit-stand and Chair transfers, standing activities, Ambulation on level ground and review of post op mobility/plan of care to improve mobility, strength, balance and activity tolerance .  Required minimal Verbal cues;Safety awareness training;Tactile cues to promote static and dynamic balance in standing and promote motor control of bilateral, lower extremity(s).       Braces/Orthotics/Lines/Etc:   ?? drain hemovac   ??  O2 Device: Room air  Treatment/Session Assessment:    ?? Response to Treatment:  Pt performs mobility in room and hallway with min handheld assist  ?? Interdisciplinary Collaboration:   o Physical Therapist  o Registered Nurse  ?? After treatment position/precautions:   o Up in chair  o Bed/Chair-wheels locked  o Bed in low position  o Call light within reach  o Family at bedside   ?? Compliance with Program/Exercises: Will assess as treatment progresses  ?? Recommendations/Intent for next treatment session:  "Next visit will focus on advancements to more challenging activities and reduction in assistance provided".  Total Treatment Duration:  PT Patient Time In/Time Out  Time In: 1028  Time Out: [place], DPT

## 2018-06-10 NOTE — Progress Notes (Signed)
Pt up ambulating the halls.

## 2018-06-10 NOTE — Progress Notes (Signed)
Problem: Pain  Goal: *Control of Pain  Outcome: Progressing Towards Goal     Problem: Impaired Skin Integrity/Pressure Injury Treatment  Goal: *Improvement of Existing Pressure Injury  Outcome: Progressing Towards Goal

## 2018-06-10 NOTE — Progress Notes (Signed)
Chart review complete, CM will continue to follow for DC needs.

## 2018-06-10 NOTE — Progress Notes (Signed)
TRANSFER - IN REPORT:    Verbal report received from Maudie Mercury, [place] on Matthew Sherman  being received from 6th floor for routine progression of care      Report consisted of patient???s Situation, Background, Assessment and   Recommendations(SBAR).     Information from the following report(s) SBAR, Kardex, Procedure Summary, Intake/Output, MAR and Recent Results was reviewed with the receiving nurse.    Opportunity for questions and clarification was provided.      Assessment completed upon patient???s arrival to unit and care assumed.

## 2018-06-10 NOTE — Progress Notes (Signed)
Shift Summary  Hourly rounds performed on pt, pt resting in bed quietly with head the bed elevated. Patient remained on??RA.??C/O pain this shift and was medicated with PRN med prescribed accordingly.??Incision site dressing is c/d/i. Hemovac drain draining out bright blood. Neurovascular checks completed without impairment noted. Patient remained alert and oriented x 4.??Bilateral FA IV sites remained in place.??No concern noted or??voiced this shift, afebrile, VSS, some output and 0 BM noted. No acute distress noted, care continue till transfer of care is done to up coming RN.

## 2018-06-10 NOTE — Progress Notes (Signed)
NEUROSURGERY PROGRESS NOTE:   Admit Date: 06/09/2018  Subjective:   No acute overnight events. Patient state his legs do feel better this am. State his pain is a 4 out of 10 after a Norco this am     Objective:  Visit Vitals  BP 157/72 (BP 1 Location: Left arm, BP Patient Position: At rest)   Pulse 77   Temp 98.1 ??F (36.7 ??C)   Resp 20   Ht 6' (1.829 m)   Wt 212 lb 6.4 oz (96.3 kg)   SpO2 97%   BMI 28.81 kg/m??     General: No acute distress  Awake, alert, and oriented   Eyes open spontaneously   Hearing grossly intact   No pronation or drift on exam   Patient with 5/5 strength in the bilateral upper and bilateral lower extremities   Incision: Clean, dry and intact with an Aquacel dressing in place with a drain holding suction with 275 cc of serosanguinous output     Assessment and Plan:   Matthew Sherman 77 y.o. male who is now 1 Day Post-Op from a L2-L5 laminectomies for decompression for treatment of lumbar spinal stenosis with neurogenic claudication.   - Continue SCDs for DVT prophylaxis   - Regular diet   - Continue bowel regimen  - Continue PO and IV PRN pain medications   - Continue surgical drain and antibiotic drain prophylaxis   - Transfer to the 7th floor when a bed is available  - PT and OT eval   - Continue incentive spirometery   - Up ad lib and to chair TID     Lurene Shadow, MD

## 2018-06-10 NOTE — Progress Notes (Signed)
06/10/18 1112   Dual Skin Pressure Injury Assessment   Dual Skin Pressure Injury Assessment WDL   Second Care Provider (Based on [place]) Remo Lipps, RN   Skin Integumentary   Skin Integumentary (WDL) X   Skin Color Appropriate for ethnicity   Skin Condition/Temp Warm;Dry   Skin Integrity Incision (comment);Scars (comment)  (Incision: Back)   Turgor Non-tenting   Wound Prevention and Protection Methods   Orientation of Wound Prevention Posterior   Location of Wound Prevention Sacrum/Coccyx   Dressing Present  No   Wound Offloading (Prevention Methods) Bed, pressure redistribution/air;Bed, pressure reduction mattress;Pillows;Repositioning;Turning

## 2018-06-10 NOTE — Progress Notes (Signed)
....  TRANSFER - OUT REPORT:    Verbal report given to [facility] RN  on Matthew Sherman  being transferred to 7th floor.        Report consisted of patient???s Situation, Background, Assessment and   Recommendations(SBAR).     Information from the following report was reviewed with the receiving nurse.    Lines:   Peripheral IV 06/09/18 Right Forearm (Active)   Site Assessment Clean, dry, & intact 06/10/2018  8:07 AM   Phlebitis Assessment 0 06/10/2018  8:07 AM   Infiltration Assessment 0 06/10/2018  8:07 AM   Dressing Status Clean, dry, & intact 06/10/2018  8:07 AM   Dressing Type Tape;Transparent 06/10/2018  8:07 AM   Hub Color/Line Status Pink;Capped 06/10/2018  3:04 AM   Action Taken Other (comment) 06/10/2018  3:04 AM       Peripheral IV 06/09/18 Left Arm (Active)   Site Assessment Clean, dry, & intact 06/10/2018  8:07 AM   Phlebitis Assessment 0 06/10/2018  8:07 AM   Infiltration Assessment 0 06/10/2018  8:07 AM   Dressing Status Clean, dry, & intact 06/10/2018  8:07 AM   Dressing Type Tape;Transparent 06/10/2018  8:07 AM   Hub Color/Line Status Gray;Capped 06/10/2018  3:04 AM   Action Taken Other (comment) 06/10/2018  3:04 AM        Opportunity for questions and clarification was provided.      Patient transported with:

## 2018-06-10 NOTE — Progress Notes (Signed)
CM met with pt and spouse at bedside to complete assessment. Pt confirmed address, PCP, emergency contact, and insurance. Pt lives with spouse. He confirmed at a baseline prior to admission, pt is independent with ADL's such as cooking, cleaning, bathing and driving. Pt stated no DME in the home. Pt has no difficulty obtaining medications in the community. He anticipates to return home when medically stabled. At this time, pt voiced no needs. CM will continue to follow.     Care Management Interventions  PCP Verified by CM: Yes  Mode of Transport at Discharge: Other (see comment)  Transition of Care Consult (CM Consult): Discharge Planning  Discharge Durable Medical Equipment: No  Physical Therapy Consult: Yes  Occupational Therapy Consult: Yes  Speech Therapy Consult: No  Current Support Network: Own Home, Lives with Spouse  Confirm Follow Up Transport: Self  Plan discussed with Pt/Family/Caregiver: Yes  Freedom of Choice Offered: Yes  [place] Provided?: No  Discharge Location  Discharge Placement: Home

## 2018-06-11 MED ORDER — POLYETHYLENE GLYCOL 3350 17 GRAM (100 %) ORAL POWDER PACKET
17 gram | Freq: Every day | ORAL | Status: DC
Start: 2018-06-11 — End: 2018-06-12
  Administered 2018-06-12: 15:00:00 via ORAL

## 2018-06-11 MED ORDER — CEFAZOLIN 2 GRAM/20 ML IN STERILE WATER INTRAVENOUS SYRINGE
2 gram/0 mL | Freq: Three times a day (TID) | INTRAVENOUS | Status: DC
Start: 2018-06-11 — End: 2018-06-12
  Administered 2018-06-11 – 2018-06-12 (×4): via INTRAVENOUS

## 2018-06-11 MED FILL — CEFAZOLIN 2 GRAM/20 ML IN STERILE WATER INTRAVENOUS SYRINGE: 2 gram/0 mL | INTRAVENOUS | Qty: 20

## 2018-06-11 MED FILL — PANTOPRAZOLE 40 MG TAB, DELAYED RELEASE: 40 mg | ORAL | Qty: 1

## 2018-06-11 MED FILL — ALCOHOL 62% (NOZIN) NASAL SANITIZER: 62 % | CUTANEOUS | Qty: 1

## 2018-06-11 MED FILL — HYDROCODONE-ACETAMINOPHEN 10 MG-325 MG TAB: 10-325 mg | ORAL | Qty: 1

## 2018-06-11 NOTE — Progress Notes (Signed)
Received message from PT that pt needs 3 n 1 bedside commode for home use, orders/clinical faxed to [facility], pt and male visitor made aware DME will be delivered prior to dc home.

## 2018-06-11 NOTE — Progress Notes (Signed)
NEUROSURGERY PROGRESS NOTE:   Admit Date: 06/09/2018  Subjective:   No acute overnight events. Patient was able to walk in the hallway with therapy yesterday and states his legs feel better.     Objective:  Visit Vitals  BP 170/85 (BP 1 Location: Right arm, BP Patient Position: Post activity)   Pulse 91   Temp 98.7 ??F (37.1 ??C)   Resp 20   Ht 6' (1.829 m)   Wt 212 lb 6.4 oz (96.3 kg)   SpO2 94%   BMI 28.81 kg/m??     General: No acute distress  Awake, alert, and oriented   Eyes open spontaneously   Hearing grossly intact   No pronation or drift on exam   Patient with 5/5 strength in the bilateral upper and bilateral lower extremities   Incision: Clean, dry and intact with an Aquacel dressing in place with a drain holding suction with 190 cc of serosanguinous output      Assessment and Plan:   Matthew Sherman 77 y.o. male who is now 2 Days Post-Op from a L2-L5 laminectomies for decompression for treatment of lumbar spinal stenosis with neurogenic claudication. His Hb yesterday was 12.3 following surgery. He is doing well and progressing as expected   - Continue SCDs for DVT prophylaxis   - Regular diet   - Continue bowel regimen  - Continue PO and IV PRN pain medications   - Continue surgical drain and antibiotic drain prophylaxis   - PT and OT eval   - Continue incentive spirometery   - Up ad lib and to chair TID   - Dispo: Anticipate discharge home in a few days     Lurene Shadow, MD

## 2018-06-11 NOTE — Progress Notes (Signed)
Problem: Self Care Deficits Care Plan (Adult)  Goal: *Acute Goals and Plan of Care (Insert Text)  Description  1. Pt will toilet with SBA   2. Pt will complete functional mobility for ADLs with SBA  3. Pt will complete lower body dressing with SBA using AE as needed  4. Pt will complete grooming and hygiene at sink with SBA  5. Pt will demonstrate independence with HEP to promote increased BUE strength and functional use for ADLs      Timeframe: 7 days     Outcome: Progressing Towards Goal     OCCUPATIONAL THERAPY: Initial Assessment and Daily Note 06/11/2018  INPATIENT: OT Visit Days: 1  Payor: HUMANA MEDICARE / Plan: [facility] HU[employer]NA MEDICARE CHOICE PPO/PFFS / Product Type: Managed Care Medicare /      NAME/AGE/GENDER: Matthew Sherman is a 77 y.o. male   PRIMARY DIAGNOSIS:  Lumbar stenosis with neurogenic claudication [M48.062]  Spinal stenosis of lumbar region with neurogenic claudication [M48.062]  S/P laminectomy [Z98.890] <principal problem not specified>   <principal problem not specified>    Procedure(s) (LRB):  L2-L5 LAMINECTOMY FOR DECOMPRESSION (N/A)  2 Days Post-Op  ICD-10: Treatment Diagnosis:    Low Back Pain (M54.5)   Precautions/Allergies:    back Patient has no known allergies.      ASSESSMENT:     Matthew Sherman is s/p L2-5 laminectomy. Pt lives with his wife and is independent at baseline, does not use any DME. This session, pt presented with decreased mobility, endurance, and limitations of spinal precautions impacting ADLs. Pt educated on back precautions and on adaptive techniques and AE to maintain during ADLs and mobility for ADLs. Pt stood with CGA, extra time to extend through knees. Pt demonstrated mobility in room and bathroom with SBA-CGA w/o an AD, took slow, shuffling steps. Pt toileted with CGA, completed grooming and hygiene at sink with SBA. Pt required min A to don/ doff socks and underwear using reacher and sock aide. Pt  initially endorsed 8/10 pain at start of session, decreased to 2/10 following mobility. Pt is below his functional baseline and would benefit from skilled OT services to address deficits.     This section established at most recent assessment   PROBLEM LIST (Impairments causing functional limitations):  Decreased Strength  Decreased ADL/Functional Activities  Decreased Transfer Abilities  Decreased Balance  Increased Pain  Decreased Activity Tolerance  Decreased Flexibility/Joint Mobility   INTERVENTIONS PLANNED: (Benefits and precautions of occupational therapy have been discussed with the patient.)  Activities of daily living training  Adaptive equipment training  Balance training  Therapeutic activity  Therapeutic exercise     TREATMENT PLAN: Frequency/Duration: Follow patient 3 times/ week to address above goals.  Rehabilitation Potential For Stated Goals: Good     REHAB RECOMMENDATIONS (at time of discharge pending progress):    Placement:  It is my opinion, based on this patient's performance to date, that Matthew Sherman may benefit from d/c home, likely no d/c needs.  Equipment:   3:1 BSC, reacher               OCCUPATIONAL PROFILE AND HISTORY:   History of Present Injury/Illness (Reason for Referral):  See H&P  Past Medical History/Comorbidities:   Matthew Sherman  has a past medical history of Hypertension.  Matthew Sherman  has a past surgical history that includes hx appendectomy (1956); hx cataract removal (Bilateral); and hx tonsil and adenoidectomy.  Social History/Living Environment:   Home Environment: Private residence  #  Steps to Enter: 3  Rails to Enter: No  Wheelchair Ramp: No  One/Two Story Residence: One story  Living Alone: No  Support Systems: Copy  Patient Expects to be Discharged to:: Private residence  Current DME Used/Available at Home: None  Tub or Shower Type: Tub/Shower combination  Prior Level of Function/Work/Activity:  See assessment      Number of Personal Factors/Comorbidities that affect the Plan of Care: Expanded review of therapy/medical records (1-2):  MODERATE COMPLEXITY   ASSESSMENT OF OCCUPATIONAL PERFORMANCE::   Activities of Daily Living:   Basic ADLs (From Assessment) Complex ADLs (From Assessment)   Feeding: Independent  Oral Facial Hygiene/Grooming: Stand-by assistance  Bathing: Minimum assistance  Upper Body Dressing: Stand-by assistance  Lower Body Dressing: Minimum assistance  Toileting: Stand by assistance, Contact guard assistance Instrumental ADL  Meal Preparation: Moderate assistance  Homemaking: Moderate assistance   Grooming/Bathing/Dressing Activities of Daily Living   Grooming  Grooming Assistance: Stand-by assistance;Contact guard assistance             Toileting  Toileting Assistance: Stand-by assistance;Contact guard assistance     Functional Transfers  Bathroom Mobility: Stand-by assistance;Contact guard assistance  Toilet Transfer : Contact guard assistance   Lower Body Dressing Assistance  Dressing Assistance: Minimum assistance Bed/Mat Mobility  Rolling: Independent  Supine to Sit: Independent  Sit to Supine: Independent  Sit to Stand: Stand-by assistance  Stand to Sit: Stand-by assistance     Most Recent Physical Functioning:   Gross Assessment:  AROM: Within functional limits  Strength: Generally decreased, functional  Coordination: Within functional limits  Sensation: Intact               Posture:  Posture (WDL): Exceptions to WDL  Posture Assessment: Forward head, Rounded shoulders  Balance:  Sitting: Intact  Standing: Impaired  Standing - Static: Good  Standing - Dynamic : Fair(+) Bed Mobility:  Rolling: Independent  Supine to Sit: Independent  Sit to Supine: Independent  Wheelchair Mobility:     Transfers:  Sit to Stand: Stand-by assistance  Stand to Sit: Stand-by assistance            Patient Vitals for the past 6 hrs:   BP BP Patient Position SpO2 Pulse   06/11/18 0759 137/71 At rest 92 % 84    06/11/18 1204 116/69 At rest 90 % 84       Mental Status  Neurologic State: Alert  Orientation Level: Oriented X4  Cognition: Follows commands                          Physical Skills Involved:  Balance  Activity Tolerance  Pain (acute) Cognitive Skills Affected (resulting in the inability to perform in a timely and safe manner):  none  Psychosocial Skills Affected:  Habits/Routines  Environmental Adaptation   Number of elements that affect the Plan of Care: 3-5:  MODERATE COMPLEXITY   CLINICAL DECISION MAKING:   [facility] Home	[place] AM-PAC??? ???6 Clicks???   Daily Activity Inpatient Short Form  How much help from another person does the patient currently need... Total A Lot A Little None   1.  Putting on and taking off regular lower body clothing?   ? 1   ? 2   ? 3   ? 4   2.  Bathing (including washing, rinsing, drying)?   ? 1   ? 2   ? 3   ? 4   3.  Toileting, which includes  using toilet, bedpan or urinal?   ? 1   ? 2   ? 3   ? 4   4.  Putting on and taking off regular upper body clothing?   ? 1   ? 2   ? 3   ? 4   5.  Taking care of personal grooming such as brushing teeth?   ? 1   ? 2   ? 3   ? 4   6.  Eating meals?   ? 1   ? 2   ? 3   ? 4   ?? 2007, Trustees of [employer], under license to [employer]. All rights reserved      Score:  Initial: 20 Most Recent: X (Date: -- )    Interpretation of Tool:  Represents activities that are increasingly more difficult (i.e. Bed mobility, Transfers, Gait).    Medical Necessity:     Patient demonstrates good   rehab potential due to higher previous functional level.  Reason for Services/Other Comments:  Patient continues to require skilled intervention due to decreased ADLs and functional performance from baseline   .   Use of outcome tool(s) and clinical judgement create a POC that gives a: LOW COMPLEXITY         TREATMENT:   (In addition to Assessment/Re-Assessment sessions the following treatments were rendered)     Pre-treatment Symptoms/Complaints:    Pain: Initial:    Pain Intensity 1: 8  Pain Intervention(s) 1: Repositioned  Post Session:  2     Self Care: (20 min): Procedure(s) (per grid) utilized to improve and/or restore self-care/home management as related to dressing, toileting and grooming. Required minimal   cueing to facilitate activities of daily living skills and compensatory activities.    Braces/Orthotics/Lines/Etc:   O2 Device: Room air  Treatment/Session Assessment:    Response to Treatment:  no adverse reaction   Interdisciplinary Collaboration:   Tour manager  After treatment position/precautions:   Up in chair  Bed/Chair-wheels locked  Call light within reach  RN notified  Family at bedside   Compliance with Program/Exercises: Compliant all of the time.  Recommendations/Intent for next treatment session:  "Next visit will focus on advancements to more challenging activities and reduction in assistance provided".  Total Treatment Duration:  OT Patient Time In/Time Out  Time In: 1044  Time Out: 1115     Rick Duff, OT

## 2018-06-11 NOTE — Progress Notes (Signed)
Problem: Mobility Impaired (Adult and Pediatric)  Goal: *Acute Goals and Plan of Care (Insert Text)  Description  LTG:  (1.)Matthew Sherman will move from supine to sit and sit to supine , scoot up and down and roll side to side with MODIFIED INDEPENDENCE within 7 treatment day(s).    (2.)Matthew Sherman will transfer from bed to chair and chair to bed with SUPERVISION using the least restrictive device within 7 treatment day(s).    (3.)Matthew Sherman will ambulate with SUPERVISION for 250+ feet with the least restrictive device within 7 treatment day(s).   (4.)Matthew Sherman will ascend and descend 3 steps with stand by assist or min handheld assist within 7 days.  ________________________________________________________________________________________________   Outcome: Progressing Towards Goal     PHYSICAL THERAPY: Daily Note and PM 06/11/2018  INPATIENT: PT Visit Days : 2  Payor: HUMANA MEDICARE / Plan: [facility] HU[employer]NA MEDICARE CHOICE PPO/PFFS / Product Type: Managed Care Medicare /       NAME/AGE/GENDER: Matthew Sherman is a 77 y.o. male   PRIMARY DIAGNOSIS: Lumbar stenosis with neurogenic claudication [M48.062]  Spinal stenosis of lumbar region with neurogenic claudication [M48.062]  S/P laminectomy [Z98.890] <principal problem not specified>   <principal problem not specified>    Procedure(s) (LRB):  L2-L5 LAMINECTOMY FOR DECOMPRESSION (N/A)  2 Days Post-Op  ICD-10: Treatment Diagnosis:    ?? Generalized Muscle Weakness (M62.81)  ?? Difficulty in walking, Not elsewhere classified (R26.2)  ?? Other abnormalities of gait and mobility (R26.89)  ?? Low Back Pain (M54.5)   Precaution/Allergies:  Patient has no known allergies.      ASSESSMENT:     Matthew Sherman is a 77 year old male seen for initial therapy evaluation following above procedure. At baseline he lives with spouse and is fully independent without use of any DME. On fall 3-4 months ago.     11/14- pt presents just sitting up with family member and is willing to  walk.  He struggles to stand reporting it is painful but does not need help.  He walked the loop without any AD starting slowly but improving some with distance.  He practiced going up and over the stair unit 2 times with good ability.  Upon return to the room he practiced getting in and out of a flat bed without the railing with perfect log roll technique and no assistance.  Do to him struggling to stand he would benefit from a commode to stand from his low toilet at home.  Excellent progress and just about meeting all goals.  Will work on sit to stand from low toilet later today.    PM:  Patient was sleeping and log rolled out of a flat bed without help.  He stood from an elevated bed and walked the same distance as this morning with cues for trying to take bigger stride lengths.  He performed standing exercises below with good ability then returned to chair in his room.   His 3-1 commode had been delivered to his room so discussed use of it.  Good progress with mobility.  Will likely see one more time tomorrow before discharge with goals met.    This section established at most recent assessment   PROBLEM LIST (Impairments causing functional limitations):  1. Decreased Strength  2. Decreased Transfer Abilities  3. Decreased Ambulation Ability/Technique  4. Decreased Balance  5. Increased Pain  6. Decreased Activity Tolerance   INTERVENTIONS PLANNED: (Benefits and precautions of physical therapy have been discussed  with the patient.)  1. Balance Exercise  2. Bed Mobility  3. Group Therapy  4. Home Exercise Program (HEP)  5. Therapeutic Activites  6. Therapeutic Exercise/Strengthening  7. Transfer Training     TREATMENT PLAN: Frequency/Duration: twice daily for duration of hospital stay  Rehabilitation Potential For Stated Goals: Good     REHAB RECOMMENDATIONS (at time of discharge pending progress):    Placement:  It is my opinion, based on this patient's performance to date, that Mr.  Sherman may benefit from [place] after discharge due to the functional deficits listed above that are likely to improve with skilled rehabilitation because he/she has multiple medical issues that affect his/her functional mobility in the community.  Equipment:   ? tbd               HISTORY:   History of Present Injury/Illness (Reason for Referral):  Per H&P, "Matthew Sherman is a 77 y.o. male who presents today with of complaints of his feet feeling cold and wet as well as straining or cramping in his lower extremities.  He was evaluated in and determined to have symptoms and findings consistent with lumbar spinal stenosis with neurogenic claudication. He state most recently his legs gave out on him at a wedding. He failed conservative medical management. The patient has an MRI lumbar spine without contrast performed on 04/07/2018 that demonstrates multilevel lumbar spinal stenosis.  At L4-L5 there is severe central and lateral recess stenosis secondary to facet arthropathy and ligamentum flavum hypertrophy that results in almost a complete block at the L4-L5 level.  At L3-L4 there is severe lateral recess and central spinal stenosis secondary to facet arthropathy ligamentum flavum hypertrophy and epidural lipomatosis and at L2-L3 there is also severe central lateral recess stenosis secondary to facet arthropathy and ligamentum flavum hypertrophy.  There is moderate left neuroforaminal stenosis at L2-L3, severe right-sided neuroforaminal stenosis at L4-L5 moderate bilateral neuroforaminal stenosis at L3-L4, moderate bilateral neuroforaminal stenosis at L5 and S1."    Past Medical History/Comorbidities:   Matthew Sherman  has a past medical history of Hypertension.  Matthew Sherman  has a past surgical history that includes hx appendectomy (1956); hx cataract removal (Bilateral); and hx tonsil and adenoidectomy.  Social History/Living Environment:   Home Environment: Private residence  # Steps to Enter: 3   Rails to Enter: No  Wheelchair Ramp: No  One/Two Story Residence: One story  Living Alone: No  Support Systems: Copy  Patient Expects to be Discharged to:: Private residence  Current DME Used/Available at Home: None  Tub or Shower Type: Tub/Shower combination  Prior Level of Function/Work/Activity:  Lives with wife. Independent baseline, no DME use. Drives. 3-4 recent falls.      Number of Personal Factors/Comorbidities that affect the Plan of Care: 1-2: MODERATE COMPLEXITY   EXAMINATION:   Most Recent Physical Functioning:   Gross Assessment:                  Posture:     Balance:    Bed Mobility:  Rolling: Independent  Supine to Sit: Independent;Additional time  Sit to Supine: Independent  Wheelchair Mobility:     Transfers:  Sit to Stand: Stand-by assistance  Stand to Sit: Modified independent  Gait:     Distance (ft): 250 Feet (ft)  Assistive Device: (none)  Ambulation - Level of Assistance: Stand-by assistance  Number of Stairs Trained: 3(2x)  Stairs - Level of Assistance: Stand-by assistance  Rail Use:  Both      Body Structures Involved:  1. Bones  2. Joints  3. Muscles Body Functions Affected:  1. Sensory/Pain  2. Movement Related Activities and Participation Affected:  1. General Tasks and Demands  2. Mobility  3. Domestic Life  4. Community, Social and [employer]   Number of elements that affect the Plan of Care: 4+: HIGH COMPLEXITY   CLINICAL PRESENTATION:   Presentation: Stable and uncomplicated: LOW COMPLEXITY   CLINICAL DECISION MAKING:   [place]??? ???6 Clicks???   Basic Mobility Inpatient Short Form  How much difficulty does the patient currently have... Unable A Lot A Little None   1.  Turning over in bed (including adjusting bedclothes, sheets and blankets)?   ? 1   ? 2   ? 3   ? 4   2.  Sitting down on and standing up from a chair with arms ( e.g., wheelchair, bedside commode, etc.)   ? 1   ? 2   ? 3   ? 4    3.  Moving from lying on back to sitting on the side of the bed?   ? 1   ? 2   ? 3   ? 4   How much help from another person does the patient currently need... Total A Lot A Little None   4.  Moving to and from a bed to a chair (including a wheelchair)?   ? 1   ? 2   ? 3   ? 4   5.  Need to walk in hospital room?   ? 1   ? 2   ? 3   ? 4   6.  Climbing 3-5 steps with a railing?   ? 1   ? 2   ? 3   ? 4   ?? 2007, Trustees of [employer], under license to [employer]. All rights reserved      Score:  Initial: 19 Most Recent: X (Date: -- )    Interpretation of Tool:  Represents activities that are increasingly more difficult (i.e. Bed mobility, Transfers, Gait).    Medical Necessity:     ?? Patient demonstrates good  ??  rehab potential due to higher previous functional level.  Reason for Services/Other Comments:  ?? Patient continues to demonstrate capacity to improve strength, mobility, balance, transfers, activity tolerance which will increase independence and increase safety  ?? .   Use of outcome tool(s) and clinical judgement create a POC that gives a: Clear prediction of patient's progress: LOW COMPLEXITY            TREATMENT:   (In addition to Assessment/Re-Assessment sessions the following treatments were rendered)   Pre-treatment Symptoms/Complaints:  "ok"  Pain: Initial:   Pain Intensity 1: 2  Post Session:  2/10     Therapeutic Exercise: (25 Minutes):  Exercises per grid below to improve mobility, strength, balance and endurance.  Required minimal verbal cues to promote proper body mechanics.  Progressed complexity of movement as indicated.     Date:  06/11/18 Date:   Date:     Activity/Exercise Parameters Parameters Parameters   ambulation 250 ft     Calf raises 10x B     Standing hip abd 10x B     Standing marching 10x B     Sit back squats 10x                   Braces/Orthotics/Lines/Etc:   ??  drain hemovac   ?? O2 Device: Room air  Treatment/Session Assessment:    ?? Response to Treatment: above   ?? Interdisciplinary Collaboration:   o Physical Therapy Assistant  o Registered Nurse  ?? After treatment position/precautions:   o Up in chair  o Bed/Chair-wheels locked  o Bed in low position  o Call light within reach  o Family at bedside   ?? Compliance with Program/Exercises: Compliant all of the time  ?? Recommendations/Intent for next treatment session:  "Next visit will focus on advancements to more challenging activities and reduction in assistance provided".  Total Treatment Duration:  PT Patient Time In/Time Out  Time In: 1400  Time Out: [address], PTA

## 2018-06-11 NOTE — Other (Signed)
Interdisciplinary team rounds were held 06/11/2018 with the following team members:  Care Management, Physical Therapy, and Floor Supervisor.    Plan of care discussed. See clinical pathway and/or care plan for interventions and desired outcomes.

## 2018-06-11 NOTE — Progress Notes (Signed)
Problem: Mobility Impaired (Adult and Pediatric)  Goal: *Acute Goals and Plan of Care (Insert Text)  Description  LTG:  (1.)Mr. Snell will move from supine to sit and sit to supine , scoot up and down and roll side to side with MODIFIED INDEPENDENCE within 7 treatment day(s).    (2.)Mr. Towles will transfer from bed to chair and chair to bed with SUPERVISION using the least restrictive device within 7 treatment day(s).    (3.)Mr. Shewmake will ambulate with SUPERVISION for 250+ feet with the least restrictive device within 7 treatment day(s).   (4.)Mr. Slifer will ascend and descend 3 steps with stand by assist or min handheld assist within 7 days.  ________________________________________________________________________________________________   Outcome: Progressing Towards Goal     PHYSICAL THERAPY: Daily Note and AM 06/11/2018  OUTPATIENT: PT Visit Days : 2  Payor: [employer] MEDICARE / Plan: [place] PPO/PFFS / Product Type: Managed Care Medicare /       NAME/AGE/GENDER: Andri Prestia is a 77 y.o. male   PRIMARY DIAGNOSIS: Lumbar stenosis with neurogenic claudication [M48.062]  Spinal stenosis of lumbar region with neurogenic claudication [M48.062] <principal problem not specified>   <principal problem not specified>    Procedure(s) (LRB):  L2-L5 LAMINECTOMY FOR DECOMPRESSION (N/A)  2 Days Post-Op  ICD-10: Treatment Diagnosis:    ?? Generalized Muscle Weakness (M62.81)  ?? Difficulty in walking, Not elsewhere classified (R26.2)  ?? Other abnormalities of gait and mobility (R26.89)  ?? Low Back Pain (M54.5)   Precaution/Allergies:  Patient has no known allergies.      ASSESSMENT:     Mr. Grounds is a 77 year old male seen for initial therapy evaluation following above procedure. At baseline he lives with spouse and is fully independent without use of any DME. On fall 3-4 months ago.     11/14- pt presents just sitting up with family member and is willing to  walk.  He struggles to stand reporting it is painful but does not need help.  He walked the loop without any AD starting slowly but improving some with distance.  He practiced going up and over the stair unit 2 times with good ability.  Upon return to the room he practiced getting in and out of a flat bed without the railing with perfect log roll technique and no assistance.  Do to him struggling to stand he would benefit from a commode to stand from his low toilet at home.  Excellent progress and just about meeting all goals.  Will work on sit to stand from low toilet later today.    This section established at most recent assessment   PROBLEM LIST (Impairments causing functional limitations):  1. Decreased Strength  2. Decreased Transfer Abilities  3. Decreased Ambulation Ability/Technique  4. Decreased Balance  5. Increased Pain  6. Decreased Activity Tolerance   INTERVENTIONS PLANNED: (Benefits and precautions of physical therapy have been discussed with the patient.)  1. Balance Exercise  2. Bed Mobility  3. Group Therapy  4. Home Exercise Program (HEP)  5. Therapeutic Activites  6. Therapeutic Exercise/Strengthening  7. Transfer Training     TREATMENT PLAN: Frequency/Duration: twice daily for duration of hospital stay  Rehabilitation Potential For Stated Goals: Good     REHAB RECOMMENDATIONS (at time of discharge pending progress):    Placement:  It is my opinion, based on this patient's performance to date, that Mr. Doetsch may benefit from [place] after discharge due to the  functional deficits listed above that are likely to improve with skilled rehabilitation because he/she has multiple medical issues that affect his/her functional mobility in the community.  Equipment:   ? tbd               HISTORY:   History of Present Injury/Illness (Reason for Referral):  Per H&P, "Jeremi Losito is a 77 y.o. male who presents today  with of complaints of his feet feeling cold and wet as well as straining or cramping in his lower extremities.  He was evaluated in and determined to have symptoms and findings consistent with lumbar spinal stenosis with neurogenic claudication. He state most recently his legs gave out on him at a wedding. He failed conservative medical management. The patient has an MRI lumbar spine without contrast performed on 04/07/2018 that demonstrates multilevel lumbar spinal stenosis.  At L4-L5 there is severe central and lateral recess stenosis secondary to facet arthropathy and ligamentum flavum hypertrophy that results in almost a complete block at the L4-L5 level.  At L3-L4 there is severe lateral recess and central spinal stenosis secondary to facet arthropathy ligamentum flavum hypertrophy and epidural lipomatosis and at L2-L3 there is also severe central lateral recess stenosis secondary to facet arthropathy and ligamentum flavum hypertrophy.  There is moderate left neuroforaminal stenosis at L2-L3, severe right-sided neuroforaminal stenosis at L4-L5 moderate bilateral neuroforaminal stenosis at L3-L4, moderate bilateral neuroforaminal stenosis at L5 and S1."    Past Medical History/Comorbidities:   Mr. Biebel  has a past medical history of Hypertension.  Mr. Guillet  has a past surgical history that includes hx appendectomy (1956); hx cataract removal (Bilateral); and hx tonsil and adenoidectomy.  Social History/Living Environment:   Home Environment: Private residence  # Steps to Enter: 3  Rails to Enter: No  Wheelchair Ramp: No  One/Two Story Residence: One story  Living Alone: No  Support Systems: Copy  Patient Expects to be Discharged to:: Private residence  Current DME Used/Available at Home: None  Prior Level of Function/Work/Activity:  Lives with wife. Independent baseline, no DME use. Drives. 3-4 recent falls.      Number of Personal Factors/Comorbidities that affect the Plan of Care:  1-2: MODERATE COMPLEXITY   EXAMINATION:   Most Recent Physical Functioning:   Gross Assessment:                  Posture:     Balance:    Bed Mobility:  Rolling: Independent  Supine to Sit: Independent  Sit to Supine: Independent  Wheelchair Mobility:     Transfers:  Sit to Stand: Stand-by assistance  Stand to Sit: Stand-by assistance  Gait:     Distance (ft): 250 Feet (ft)  Assistive Device: (none)  Ambulation - Level of Assistance: Stand-by assistance  Number of Stairs Trained: 3(2x)  Stairs - Level of Assistance: Stand-by assistance  Rail Use: Both      Body Structures Involved:  1. Bones  2. Joints  3. Muscles Body Functions Affected:  1. Sensory/Pain  2. Movement Related Activities and Participation Affected:  1. General Tasks and Demands  2. Mobility  3. Domestic Life  4. Community, Social and [employer]   Number of elements that affect the Plan of Care: 4+: HIGH COMPLEXITY   CLINICAL PRESENTATION:   Presentation: Stable and uncomplicated: LOW COMPLEXITY   CLINICAL DECISION MAKING:   [facility]	[place] AM-PAC??? ???6 Clicks???   Basic Mobility Inpatient Short Form  How much difficulty does the patient  currently have... Unable A Lot A Little None   1.  Turning over in bed (including adjusting bedclothes, sheets and blankets)?   ? 1   ? 2   ? 3   ? 4   2.  Sitting down on and standing up from a chair with arms ( e.g., wheelchair, bedside commode, etc.)   ? 1   ? 2   ? 3   ? 4   3.  Moving from lying on back to sitting on the side of the bed?   ? 1   ? 2   ? 3   ? 4   How much help from another person does the patient currently need... Total A Lot A Little None   4.  Moving to and from a bed to a chair (including a wheelchair)?   ? 1   ? 2   ? 3   ? 4   5.  Need to walk in hospital room?   ? 1   ? 2   ? 3   ? 4   6.  Climbing 3-5 steps with a railing?   ? 1   ? 2   ? 3   ? 4   ?? 2007, [employer], under license to [employer]. All rights reserved      Score:  Initial: 19 Most Recent: X (Date: -- )     Interpretation of Tool:  Represents activities that are increasingly more difficult (i.e. Bed mobility, Transfers, Gait).    Medical Necessity:     ?? Patient demonstrates good  ??  rehab potential due to higher previous functional level.  Reason for Services/Other Comments:  ?? Patient continues to demonstrate capacity to improve strength, mobility, balance, transfers, activity tolerance which will increase independence and increase safety  ?? .   Use of outcome tool(s) and clinical judgement create a POC that gives a: Clear prediction of patient's progress: LOW COMPLEXITY            TREATMENT:   (In addition to Assessment/Re-Assessment sessions the following treatments were rendered)   Pre-treatment Symptoms/Complaints:  "ok"  Pain: Initial:   Pain Intensity 1: 0  Post Session:  2/10     Therapeutic Activity: (25 minutes ):  Therapeutic activities including Bed mobility with instruction/education on log roll technique, body mechanics, and spinal precautions, sit-stand transfers,  Ambulation on level ground and up and down stair unit 2 times to improve mobility, strength, balance and activity tolerance .  Required no assistance to promote static and dynamic balance in standing and promote motor control of bilateral, lower extremity(s).       Braces/Orthotics/Lines/Etc:   ?? drain hemovac   ?? O2 Device: Room air  Treatment/Session Assessment:    ?? Response to Treatment: above  ?? Interdisciplinary Collaboration:   o Physical Therapy Assistant  o Registered Nurse  ?? After treatment position/precautions:   o Up in chair  o Bed/Chair-wheels locked  o Bed in low position  o Call light within reach  o Family at bedside   ?? Compliance with Program/Exercises: Compliant all of the time  ?? Recommendations/Intent for next treatment session:  "Next visit will focus on advancements to more challenging activities and reduction in assistance provided".  Total Treatment Duration:  PT Patient Time In/Time Out  Time In: 0905   Time Out: 0930    Jeanne Ivan, PTA

## 2018-06-11 NOTE — Telephone Encounter (Signed)
Per  Misty in  Utilization Review-  Recommendations are for a 23 hr OBS change in status order    Misty [phone]

## 2018-06-11 NOTE — Progress Notes (Signed)
Problem: Pain  Goal: *Control of Pain  Outcome: Progressing Towards Goal     Problem: Patient Education: Go to Patient Education Activity  Goal: Patient/Family Education  Outcome: Progressing Towards Goal     Problem: Falls - Risk of  Goal: *Absence of Falls  Description  Document Patrcia Dolly Fall Risk and appropriate interventions in the flowsheet.  Outcome: Progressing Towards Goal  Note:   Fall Risk Interventions:            Medication Interventions: Bed/chair exit alarm, Teach patient to arise slowly, Patient to call before getting OOB, Evaluate medications/consider consulting pharmacy    Elimination Interventions: Bed/chair exit alarm, Call light in reach, Patient to call for help with toileting needs, Stay With Me (per policy), Urinal in reach    History of Falls Interventions: Bed/chair exit alarm, Door open when patient unattended, Investigate reason for fall         Problem: Patient Education: Go to Patient Education Activity  Goal: Patient/Family Education  Outcome: Progressing Towards Goal     Problem: Simple Spine Procedure: Discharge Outcomes  Goal: *Optimal pain control at patient's stated goal  Outcome: Progressing Towards Goal  Goal: *Demonstrates ability to place and remove stabilization device  Outcome: Progressing Towards Goal  Goal: *Progress independence mobility/activities (eg: Mobility precautions)  Outcome: Progressing Towards Goal  Goal: *Resumes normal function of bladder and bowel  Outcome: Progressing Towards Goal  Goal: *Lungs clear or at baseline  Outcome: Progressing Towards Goal  Goal: *Verbalizes name, dosage, time, side effects, and number of days to continue medications  Outcome: Progressing Towards Goal  Goal: *Modified independence with transfers, ambulation on levels with assistance devices, stair climbing, ADL's  Outcome: Progressing Towards Goal  Goal: *Describes follow-up/return visits to physicians  Outcome: Progressing Towards Goal   Goal: *Describes available resources and support systems  Outcome: Progressing Towards Goal  Goal: *Labs within defined limits  Outcome: Progressing Towards Goal  Goal: *Tolerating diet  Outcome: Progressing Towards Goal

## 2018-06-12 MED ORDER — LISINOPRIL 20 MG TAB
20 mg | Freq: Every day | ORAL | Status: DC
Start: 2018-06-12 — End: 2018-06-12

## 2018-06-12 MED ORDER — BISACODYL 10 MG RECTAL SUPPOSITORY
10 mg | Freq: Every day | RECTAL | Status: DC
Start: 2018-06-12 — End: 2018-06-12

## 2018-06-12 MED ORDER — HYDROCODONE-ACETAMINOPHEN 10 MG-325 MG TAB
10-325 mg | ORAL_TABLET | ORAL | 0 refills | Status: AC | PRN
Start: 2018-06-12 — End: 2018-06-17

## 2018-06-12 MED ORDER — SENNOSIDES 8.6 MG TAB
8.6 mg | ORAL_TABLET | Freq: Every day | ORAL | 0 refills | Status: DC
Start: 2018-06-12 — End: 2018-06-19

## 2018-06-12 MED ORDER — AMLODIPINE 5 MG TAB
5 mg | Freq: Every day | ORAL | Status: DC
Start: 2018-06-12 — End: 2018-06-12

## 2018-06-12 MED ORDER — CYCLOBENZAPRINE 10 MG TAB
10 mg | ORAL_TABLET | Freq: Three times a day (TID) | ORAL | 0 refills | Status: DC | PRN
Start: 2018-06-12 — End: 2018-06-19

## 2018-06-12 MED ORDER — .PHARMACY TO SUBSTITUTE PER PROTOCOL
Status: DC | PRN
Start: 2018-06-12 — End: 2018-06-12

## 2018-06-12 MED FILL — MIRALAX 17 GRAM ORAL POWDER PACKET: 17 gram | ORAL | Qty: 1

## 2018-06-12 MED FILL — ALCOHOL 62% (NOZIN) NASAL SANITIZER: 62 % | CUTANEOUS | Qty: 1

## 2018-06-12 MED FILL — BISACODYL 10 MG RECTAL SUPPOSITORY: 10 mg | RECTAL | Qty: 1

## 2018-06-12 MED FILL — CEFAZOLIN 2 GRAM/20 ML IN STERILE WATER INTRAVENOUS SYRINGE: 2 gram/0 mL | INTRAVENOUS | Qty: 20

## 2018-06-12 MED FILL — PANTOPRAZOLE 40 MG TAB, DELAYED RELEASE: 40 mg | ORAL | Qty: 1

## 2018-06-12 MED FILL — HYDROCODONE-ACETAMINOPHEN 10 MG-325 MG TAB: 10-325 mg | ORAL | Qty: 1

## 2018-06-12 NOTE — Progress Notes (Signed)
Problem: Pain  Goal: *Control of Pain  Outcome: Progressing Towards Goal     Problem: Patient Education: Go to Patient Education Activity  Goal: Patient/Family Education  Outcome: Progressing Towards Goal     Problem: Impaired Skin Integrity/Pressure Injury Treatment  Goal: *Improvement of Existing Pressure Injury  Outcome: Progressing Towards Goal     Problem: Patient Education: Go to Patient Education Activity  Goal: Patient/Family Education  Outcome: Progressing Towards Goal     Problem: Falls - Risk of  Goal: *Absence of Falls  Description  Document Matthew Sherman Fall Risk and appropriate interventions in the flowsheet.  Outcome: Progressing Towards Goal  Note:   Fall Risk Interventions:            Medication Interventions: Bed/chair exit alarm, Evaluate medications/consider consulting pharmacy, Patient to call before getting OOB, Teach patient to arise slowly    Elimination Interventions: Call light in reach, Bed/chair exit alarm, Patient to call for help with toileting needs, Stay With Me (per policy), Urinal in reach    History of Falls Interventions: Bed/chair exit alarm, Evaluate medications/consider consulting pharmacy, Investigate reason for fall         Problem: Patient Education: Go to Patient Education Activity  Goal: Patient/Family Education  Outcome: Progressing Towards Goal     Problem: Simple Spine Procedure: Discharge Outcomes  Goal: *Optimal pain control at patient's stated goal  Outcome: Progressing Towards Goal  Goal: *Demonstrates ability to place and remove stabilization device  Outcome: Progressing Towards Goal  Goal: *Progress independence mobility/activities (eg: Mobility precautions)  Outcome: Progressing Towards Goal  Goal: *Resumes normal function of bladder and bowel  Outcome: Progressing Towards Goal  Goal: *Lungs clear or at baseline  Outcome: Progressing Towards Goal  Goal: *Verbalizes name, dosage, time, side effects, and number of days to continue medications   Outcome: Progressing Towards Goal  Goal: *Modified independence with transfers, ambulation on levels with assistance devices, stair climbing, ADL's  Outcome: Progressing Towards Goal  Goal: *Describes follow-up/return visits to physicians  Outcome: Progressing Towards Goal  Goal: *Describes available resources and support systems  Outcome: Progressing Towards Goal  Goal: *Labs within defined limits  Outcome: Progressing Towards Goal  Goal: *Tolerating diet  Outcome: Progressing Towards Goal

## 2018-06-12 NOTE — Discharge Summary (Signed)
[place] SUMMARY     Patient Name: Matthew Sherman  Patient MRN: [id number]   Patient Date of Birth: [birth year]  Date of Admission: 06/09/2018  Date of Discharge: 06/12/2018     Pre-Procedure Diagnosis: Lumbar spinal stenosis with neurogenic claudication L2-L5  Post-Procedure Diagnosis: SAME AS ABOVE     Admitting Attending: Stephan Minister. Bradd Canary, MD  Discharge Attending: SAME AS ABOVE    Past Medical History:   Diagnosis Date   ??? Hypertension      Past Surgical History:   Procedure Laterality Date   ??? HX APPENDECTOMY  1956   ??? HX CATARACT REMOVAL Bilateral     right done 2018, left 2002   ??? HX TONSIL AND ADENOIDECTOMY       No Known Allergies    Procedures Performed During Hospitalization:   06/09/2018  1. Lumbar Laminectomy, medial facetectomy and foraminotomy L2-L3, L3-L4 and L4-L5    Summary of Hospital Events:   Matthew Sherman is a 77 y.o. male who was admitted to inpatient status in the hospital for routine post-operative convalescence. There he progressed as expected in the post-operative setting without complications. On the day of discharge his pain was adequately controlled, he was ambulating without difficulty, and he was amenable to being discharged home.     Physical Exam:   Visit Vitals  BP 146/74 (BP 1 Location: Left arm, BP Patient Position: At rest)   Pulse 78   Temp 98.7 ??F (37.1 ??C)   Resp 17   Ht 6' (1.829 m)   Wt 212 lb 6.4 oz (96.3 kg)   SpO2 (!) 9%   BMI 28.81 kg/m??     Patient is in no acute distress  Patient is awake, alert, and oriented.   Patient skin is warm and dry  No mid-line cervical, thoracic, or lumbar tenderness to palpation   Patient has 5/5 strength in the all muscle groups    Incision: is clean, dry, and intact with staples on the skin   Sensation is intact to light touch and pin-prick    Follow-Up: Wound check with Nurse Rhae Lerner at [facility]    Current Discharge Medication List       START taking these medications    Details   cyclobenzaprine (FLEXERIL) 10 mg tablet Take 1 Tab by mouth three (3) times daily as needed for Muscle Spasm(s) for up to 15 days. Indications: muscle spasm  Qty: 45 Tab, Refills: 0    Associated Diagnoses: Spinal stenosis of lumbar region with neurogenic claudication      HYDROcodone-acetaminophen (NORCO) 10-325 mg tablet Take 1 Tab by mouth every four (4) hours as needed for Pain (Moderat to Severe pain) for up to 5 days. Max Daily Amount: 6 Tabs.  Qty: 20 Tab, Refills: 0    Associated Diagnoses: Spinal stenosis of lumbar region with neurogenic claudication         CONTINUE these medications which have NOT CHANGED    Details   amLODIPine-benazepril (LOTREL) 2.5-10 mg per capsule Take 1 Cap by mouth daily.  Qty: 30 Cap, Refills: 5    Associated Diagnoses: Essential hypertension           The patient has been advised to call or seek medical attention should they develop any new weakness, numbness, tingling, fevers, drainage from incision, redness of the wound, or any other concerns.     Dallen Bunte R. Bradd Canary, [facility]

## 2018-06-12 NOTE — Progress Notes (Signed)
Pt is medically cleared for discharge to home today.  3in1 BSC delivered for home use. No other dc needs or concerns identified or reported.  SW remains available to assist as needed.    Care Management Interventions  PCP Verified by CM: Yes  Mode of Transport at Discharge: Other (see comment)  Transition of Care Consult (CM Consult): Discharge Planning  Discharge Durable Medical Equipment: Yes(3in1 BSC from [facility])  Physical Therapy Consult: Yes  Occupational Therapy Consult: Yes  Speech Therapy Consult: No  Current Support Network: Own Home, Lives with Spouse  Confirm Follow Up Transport: Self  Plan discussed with Pt/Family/Caregiver: Yes  Freedom of Choice Offered: Yes  [place] Provided?: No  Discharge Location  Discharge Placement: Home with family assistance

## 2018-06-12 NOTE — Progress Notes (Signed)
Discharge instructions  all new medications,medication side effects sheet, follow up appointment and  prescriptions reviewed and explained to the patient. Patient verbalizes understanding of instructions. A copy of discharge instructions and prescriptions  have been given to patient.  Opportunity for questions provided.

## 2018-06-12 NOTE — Telephone Encounter (Signed)
Per Lisabeth Pick from [facility]- recommendations are to downgrade hospitalization to OBS- message into Dr. Bradd Canary for his decision

## 2018-06-15 NOTE — Telephone Encounter (Signed)
TCV- Pt informed on upcoming appt on Nov 27 @12 :00 with Dr. Blanca Friend. Pt informed to bring all meds and discharge paperwork to visit.

## 2018-06-15 NOTE — Telephone Encounter (Signed)
TCV-DC 11/15-[facility]- Surgical F/U

## 2018-06-15 NOTE — Progress Notes (Signed)
This note will not be viewable in [place].      Transition of Care Discharge Follow-up Questionnaire   Date/Time of Call:   06/15/2018   1124am   What was the patient hospitalized for? Spinal stenosis of lumbar region with neurogenic claudication s/p L2-L5 LAMINECTOMY FOR DECOMPRESSION (N/A Spine Lumbar)   Does the patient understand his/her diagnosis and/or treatment and what happened during the hospitalization?     Yes, spoke with patient???s wife states understanding of diagnosis and treatment; and is agreeable to call.    Patient states doing ok . Patient???s wife states patient had a small amount of blood in stool. Patient???s wife denies patient strained during bowel movement. This Care coordinator instructed patient???s wife to call Dr. Bradd Canary office if persist. Patient???s wife voiced understanding.   Did the patient receive discharge instructions?   Yes    CM Assessed Risk for Readmission:       Patient stated Risk for Readmission:      Moderate/high r/t diagnosis, comorbidities and /or complication of surgery      None stated, planned surgery   Review any discharge instructions (see discharge instructions/AVS in [facility]).  Ask patient if they understand these.  Do they have any questions?     Reviewed, understanding is stated, no questions at this time       Were home services ordered (nursing, PT, OT, ST, etc.)?   No home health services needed at this time   If so, has the first visit occurred? If not, why? (Assist with coordination of services if necessary.)   N/A     Was any DME ordered? Yes           If so, has it been received?  If not, why?  (Assist patient in obtaining DME orders &/or equipment if necessary.) Patient ???s wife states patient received the 3 in 1 BSC   Complete a review of all medications (new, continued and discontinued meds per the D/C instructions and medication tab in [facility]). C START taking:  cyclobenzaprine 10 mg tablet (FLEXERIL)  HYDROcodone-acetaminophen 10-325 mg   tablet (NORCO)  senna 8.6 mg tablet (SENOKOT)ompleted     Were all new prescriptions filled?  If not, why?  (Assist patient in obtaining medications if necessary ??? escalate for CCM &/or SW if ongoing issues are verbalized by pt or   anticipated)   Yes, Patient states have all new medication.                 Does the patient understand the purpose and dosing instructions for all medications?  (If patient has questions, provide explanation and education.)   Yes      Does the patient have any problems in performing ADL???s? (If patient is unable to perform ADLs ??? what is the limiting factor(s)?  Do they have a support system that can assist? If no support system is present, discuss possible assistance that they may be able to obtain. Escalate for CCM/SW if ongoing issues are verbalized by pt or anticipated)   Independent with ADL???s at baseline        Does the patient have all follow-up appointments scheduled?      7 day f/up with PCP?   (f/up with PCP may be w/in 14 days if patient has a f/up with their specialist w/in 7 days)    7-14 day f/up with specialist?   (or per discharge instructions)    If f/up has not been made ??? what actions has  the care coordinator made to accomplish this?    Has transportation been arranged?   Yes          Patient???s wife will call Apolonio Schneiders C. Pruitt, DO to schedule a f/u appointment    Dr. Bradd Canary 11/25              Yes, no transportation needs at this time.     Any other questions or concerns expressed by the patient?     No other needs or concerns identified.      Contact information for Care Coordinator was given, instructed to call with new questions or concerns.   Schedule next appointment with [facility] Coordinator or refer to RN Case Manager/Social Work Case Manager per the workflow guidelines.      When is care coordinator???s next follow-up call scheduled?      If referred for CCM ??? what RN care manager was the referral assigned?   Care Coordinator will follow per workflow guidelines.  .         Within 14  day   TOC Call Completed By: Vivia Birmingham , LPN  Care Coordinator

## 2018-06-15 NOTE — Telephone Encounter (Signed)
Per Chrissy- she states patient will not eat or drink- advised to try protein shakes, ensure, boost, carnation instant and restart MVIs- advised if patient becomes dehydrated will need fluid replacement

## 2018-06-15 NOTE — Telephone Encounter (Signed)
Pt's daughter called with questions post op

## 2018-06-18 NOTE — Telephone Encounter (Signed)
Patient is having diarrhea with 9 loose stools and very frothy- patient denies any fevers, chills or N/V- patient is having cramping and bloating- advised on stay hydrated, Imodium , probiotics and PCP appointment tomorrow- check for fevers

## 2018-06-19 ENCOUNTER — Inpatient Hospital Stay: Admit: 2018-06-19 | Payer: MEDICARE | Primary: Family Medicine

## 2018-06-19 ENCOUNTER — Encounter

## 2018-06-19 ENCOUNTER — Ambulatory Visit: Admit: 2018-06-19 | Payer: MEDICARE | Attending: Family Medicine | Primary: Family Medicine

## 2018-06-19 DIAGNOSIS — R197 Diarrhea, unspecified: Secondary | ICD-10-CM

## 2018-06-19 DIAGNOSIS — R0602 Shortness of breath: Secondary | ICD-10-CM

## 2018-06-19 MED ORDER — DIPHENOXYLATE-ATROPINE 2.5 MG-0.025 MG TAB
ORAL_TABLET | Freq: Four times a day (QID) | ORAL | 0 refills | Status: AC | PRN
Start: 2018-06-19 — End: 2018-06-24

## 2018-06-19 MED ORDER — AMLODIPINE-BENAZEPRIL 2.5 MG-10 MG CAP
ORAL_CAPSULE | Freq: Every day | ORAL | 1 refills | Status: DC
Start: 2018-06-19 — End: 2018-11-10

## 2018-06-19 MED ORDER — AMLODIPINE-BENAZEPRIL 2.5 MG-10 MG CAP
ORAL_CAPSULE | Freq: Every day | ORAL | 1 refills | Status: DC
Start: 2018-06-19 — End: 2018-06-19

## 2018-06-19 NOTE — Progress Notes (Signed)
PROGRESS NOTE    SUBJECTIVE:   Matthew Sherman is a 77 y.o. male seen for a follow up visit regarding Diarrhea (since he had spinal surgery, his stool has been clear as water; going on for the 4 days now; no stomach pain )    77 y/o CM here for diarrhea after having a lumbar laminectomy on L2-5 on 06/09/18, 10 days ago. He has been going about 5-8 times a day since then. He has also felt bloated/swollen in his abdomen. He has also been feeling SOB when he lays down. He has not been eating much, but trying to keep liquids down. Imodium is not working. No fever/chills. No blood in stool. No abdominal pain. No nausea or vomiting.     Past Medical History, Past Surgical History, Family history, Social History, and Medications were all reviewed with the patient today and updated as necessary.       Current Outpatient Medications   Medication Sig Dispense Refill   ??? amLODIPine-benazepril (LOTREL) 2.5-10 mg per capsule Take 1 Cap by mouth daily. 90 Cap 1   ??? B.infantis-B.ani-B.long-B.bifi (PROBIOTIC 4X) 10-15 mg TbEC Take 1 Tab by mouth daily. 90 Tab 1     No Known Allergies  Patient Active Problem List   Diagnosis Code   ??? Hypertension I10   ??? Spinal stenosis of lumbar region with neurogenic claudication M48.062   ??? S/P laminectomy Z98.890     Past Medical History:   Diagnosis Date   ??? Hypertension      Past Surgical History:   Procedure Laterality Date   ??? HX APPENDECTOMY  1956   ??? HX CATARACT REMOVAL Bilateral     right done 2018, left 2002   ??? HX LUMBAR LAMINECTOMY  06/09/2018    lami L2-3. L3-4 4-5 Dr. Bradd Canary   ??? HX TONSIL AND ADENOIDECTOMY       Social History     Tobacco Use   ??? Smoking status: Never Smoker   ??? Smokeless tobacco: Never Used   Substance Use Topics   ??? Alcohol use: Never     Frequency: Never         Review of Systems   Constitutional: Negative for chills and fever.   Respiratory: Positive for shortness of breath. Negative for cough and wheezing.     Cardiovascular: Negative for chest pain and palpitations.   Gastrointestinal: Positive for abdominal distention and diarrhea. Negative for abdominal pain, blood in stool, nausea and vomiting.   Neurological: Negative for dizziness and headaches.         OBJECTIVE:  Visit Vitals  BP 142/80 (BP 1 Location: Right arm, BP Patient Position: Sitting)   Pulse 61   Temp 96.5 ??F (35.8 ??C) (Tympanic)   Ht 6' (1.829 m)   Wt 210 lb 6.4 oz (95.4 kg)   SpO2 97%   BMI 28.54 kg/m??        Physical Exam  Constitutional:       Appearance: Normal appearance.   Cardiovascular:      Rate and Rhythm: Normal rate and regular rhythm.      Heart sounds: No murmur.   Pulmonary:      Effort: Pulmonary effort is normal.      Breath sounds: Normal breath sounds.   Abdominal:      General: Bowel sounds are normal. There is distension.      Palpations: Abdomen is soft.      Tenderness: There is no tenderness.   Neurological:  Mental Status: He is alert and oriented to person, place, and time.   Psychiatric:         Mood and Affect: Mood normal.         Medical problems and test results were reviewed with the patient today.     No results found for this or any previous visit (from the past 672 hour(s)).      ASSESSMENT and PLAN    Diagnoses and all orders for this visit:    1. Diarrhea, unspecified type  -     diphenoxylate-atropine (LOMOTIL) 2.5-0.025 mg per tablet; Take 1 Tab by mouth four (4) times daily as needed for Diarrhea for up to 5 days. Max Daily Amount: 4 Tabs.    2. Abdominal swelling    3. SOB (shortness of breath)  -     XR CHEST PA LAT; Future    4. Essential hypertension  -     amLODIPine-benazepril (LOTREL) 2.5-10 mg per capsule; Take 1 Cap by mouth daily.    Xray was performed for SOB.   Lomotil will be used diarrhea first.   HTN medication refilled.       Follow-up and Dispositions    ?? Return if symptoms worsen or fail to improve.

## 2018-06-19 NOTE — Telephone Encounter (Signed)
Patients wife called stating that Dr. Venetia Constable nurse told him that he had the sypmtoms of C.diff. They gave him Protonix and three rounds of antibiotics while he was in the hospital.

## 2018-06-19 NOTE — Progress Notes (Signed)
I spoke to pt about his xray results.

## 2018-06-22 ENCOUNTER — Ambulatory Visit: Admit: 2018-06-22 | Discharge: 2018-06-22 | Payer: MEDICARE | Primary: Family Medicine

## 2018-06-22 DIAGNOSIS — M48062 Spinal stenosis, lumbar region with neurogenic claudication: Secondary | ICD-10-CM

## 2018-06-22 NOTE — Progress Notes (Signed)
Subjective: Patient states he has no pain but continues with daily loose stool- x 2 episodes and visible blood- patient has never had a colonoscopy; patient sees his PCP on Wednesday      Objective:  Visit Vitals  BP 148/78 (BP 1 Location: Left arm, BP Patient Position: Sitting)   Pulse 85   Temp 97.7 ??F (36.5 ??C) (Tympanic)   Ht 6' (1.829 m)   Wt 211 lb (95.7 kg)   SpO2 95%   BMI 28.62 kg/m??     Pain Scale: 0 - No pain/10    General: patient is cooperative; mood and affect are appropriate; denies any fevers,chills or HA; denies any fatigue or weakness  HEENT:trachea is midline; no voice, visual or swallowing difficulty; good ROM with neck; mucous membranes are normal  Neuro:alert and oriented; denies any dizziness; gait and coordination observed are normal; ambulates without assistance  Wound:large lumbar incision is healing well with no signs of redness or swelling- some proximal bruising noted with a right side incision excoriation size of a quarter and below incisional area 2 areas of excoriation with no signs of infection. Incision cleansed with betadine using aseptic technique, sprayed with clotting/ Lidocaine spray for comfort and the decision was made to remove every other staple today. Every other staple removed with a staple removal tool and patient tolerated well    Assessment:  Post op check: Patient is here for initial post-op check. Dr.Mathes    is the supervising physician in clinic today and approves of today's treatment plan. Abdomen palpated round slightly distended with over active bowel sounds heard- continue to monitor for fevers ,amounts of stool and continue probiotics and Imodium; patient to f/u with his PCP for a possible stool sample      Plan:  Post-op instructions: strict 2-5 lb weight limit; frequent positional changes  Incisional care: keep incision dry;do not itch scratch or submerge  BLT:no BLT and use proper body mechanics   Management and expected pain:advised patient of expected nerve swelling and length of time for nerve healing  Post-op complications:observe for infection, DVT and pneumonia  Therapy/exercises: start a walking program; demonstrated simple leg strengthening exercises  Return appointment with any follow-up films:Patient demonstrated knowledge of procedure and post op instructions; patient was given the opportunity for questions and clarification was provided

## 2018-06-22 NOTE — Patient Instructions (Signed)
Postoperative Home Instructions Lumbar Surgery    Showering: Do not soak in bathtub, hot tub or swimming pool for 3 weeks. Use only soap and water on the incision and pat it dry. Do not scrub the incision.    Wound Care: A small to moderate amount of reddish drainage on the dressing is normal for the first several days after surgery. Large amounts of clear, watery drainage is not normal and you should call doctors office immediately.  Please call if any drainage noted to wound after suture visit.    Signs of Infection: Please notify your physician for any of the following; a temperature greater than 100.5, extreme tenderness at the wound, excessive redness and/or swelling, or large amounts of drainage.    Driving: You may begin driving after your first visit to the physicians office for a wound check. If you had a lumbar fusion with instrumentation, no driving until directed.    Medication: You can take anti-inflammatory medications such as Motrin or Celebrex for 30 days after surgery only if prescribed by your physician. The pain medication prescribed may be taken as needed. You should take a stool softener twice a day, drink plenty of water and eat high fiber foods to avoid constipation. This is a common problem patients experience while taking pain medication.    Deep Breathing Exercises: Continue to use your incentive spirometer.    Smoking: Smoking will interfere with your healing. If you smoke you may end up having another surgery or more problems.    Lumbar Corsette: If prescribed by your physician, wear your corsette when standing, sitting or walking for a long period of time. You do not have to wear your corsette to sleep at night.    Activity: No heavy lifting. Your weight limit is 2-5 pounds until directed by your physician. Avoid bending, twisting at the waist or stretching above your head where incision is being pulled. You should bend with your  knees and keep your back straight if needing to pick something up off the ground. Sleep on your side or your back, but do not sleep on your stomach. No prolonged sitting. Sitting can aggravate the nerve that is healing in leg. Only sit for periods of about 15-20 minutes at any one time. Alternate between sitting, walking, standing, and lying down. You may do steps and inclines in moderation.    Sexual Relations: You may resume sexual relations 2 weeks after your surgery.     Walking Program: Walking strengthens the spinal muscles and will help protect your discs and vertebrae. Increase your walking program up to two miles per day over the next four weeks. Begin slowly with 1/8 to 1/4 of a mile and add to this each day.     Symptoms after Surgery: Do not be alarmed if you still have some of the same symptoms you had prior to surgery. The nerves often require time to heal after the pressure has been relieved. You may experience pain and symptoms that you did not have prior to surgery such as tingling, numbness and or weakness to lower extremities. If these are bothersome please report to your physician. The level of pain you experience should improve as your body heals. It takes 6-8 weeks to heal from any surgery and we expect you to experience some discomfort possibly for this period of time. You should expect to feel soreness, stiffness and pain especially in the mornings, this should improve somewhat during the day and by the evening you   may experience more pain. If you desire you can take your pain medications to relieve these symptoms and use warm moist heat especially at night to the incision site. Please contact the office should you develop any problems urinating.      Follow up: You will be given a follow up appointment after your suture visit. If you have had a lumbar fusion you will be given an order for a lumbar spine x-ray to have done approximately a week prior to return in follow-up.     Please call your physicians office if you have any other questions or problems. Listen to your body; it will tell you if you are overdoing it.    Educational Websites: [email or web address] and [email or web address]

## 2018-06-24 ENCOUNTER — Ambulatory Visit: Admit: 2018-06-24 | Payer: MEDICARE | Attending: Family Medicine | Primary: Family Medicine

## 2018-06-24 DIAGNOSIS — Z0000 Encounter for general adult medical examination without abnormal findings: Secondary | ICD-10-CM

## 2018-06-24 MED ORDER — B.ANIMALIS-B.BIFIDUM-B.INFANTIS-B.LONGUM 10 MG-15 MG TABLET,DELAY REL
10-15 mg | ORAL_TABLET | Freq: Every day | ORAL | 1 refills | Status: DC
Start: 2018-06-24 — End: 2018-08-17

## 2018-06-24 NOTE — Progress Notes (Signed)
cologuard was neg

## 2018-06-24 NOTE — Progress Notes (Signed)
PROGRESS NOTE    SUBJECTIVE:   Matthew Sherman is a 77 y.o. male seen for a follow up visit regarding Results (follow up) and Annual Wellness Visit    77 y/o CM here for diarrhea after having a lumbar laminectomy on L2-5 on 06/09/18, 15 days ago. He has been going about 5-8 times a day since then. He was feeling bloated/swollen in his abdomen, but that has gone away. The wife remembered that he was antibiotics in the hospital after his surgery.     Past Medical History, Past Surgical History, Family history, Social History, and Medications were all reviewed with the patient today and updated as necessary.       Current Outpatient Medications   Medication Sig Dispense Refill   ??? B.infantis-B.ani-B.long-B.bifi (PROBIOTIC 4X) 10-15 mg TbEC Take 1 Tab by mouth daily. 90 Tab 1   ??? amLODIPine-benazepril (LOTREL) 2.5-10 mg per capsule Take 1 Cap by mouth daily. 90 Cap 1     No Known Allergies  Patient Active Problem List   Diagnosis Code   ??? Hypertension I10   ??? Spinal stenosis of lumbar region with neurogenic claudication M48.062   ??? S/P laminectomy Z98.890     Past Medical History:   Diagnosis Date   ??? Hypertension      Past Surgical History:   Procedure Laterality Date   ??? HX APPENDECTOMY  1956   ??? HX CATARACT REMOVAL Bilateral     right done 2018, left 2002   ??? HX LUMBAR LAMINECTOMY  06/09/2018    lami L2-3. L3-4 4-5 Dr. Bradd Canary   ??? HX TONSIL AND ADENOIDECTOMY       Social History     Tobacco Use   ??? Smoking status: Never Smoker   ??? Smokeless tobacco: Never Used   Substance Use Topics   ??? Alcohol use: Never     Frequency: Never         Review of Systems   Constitutional: Negative for chills, fatigue and fever.   HENT: Negative for congestion and sinus pressure.    Respiratory: Negative for cough, shortness of breath and wheezing.    Cardiovascular: Negative for chest pain, palpitations and leg swelling.   Gastrointestinal: Positive for diarrhea. Negative for abdominal pain, nausea and vomiting.    Musculoskeletal: Negative for joint swelling.   Neurological: Negative for dizziness, tremors and weakness.   Psychiatric/Behavioral: Negative for agitation.         OBJECTIVE:  Visit Vitals  BP 144/80 (BP 1 Location: Right arm, BP Patient Position: Sitting)   Pulse 78   Temp 97.6 ??F (36.4 ??C) (Tympanic)   Ht 6' (1.829 m)   Wt 211 lb (95.7 kg)   SpO2 97%   BMI 28.62 kg/m??        Physical Exam  Constitutional:       Appearance: Normal appearance.   Neck:      Vascular: No carotid bruit.   Cardiovascular:      Rate and Rhythm: Normal rate and regular rhythm.      Pulses:           Radial pulses are 2+ on the right side and 2+ on the left side.        Posterior tibial pulses are 2+ on the right side and 2+ on the left side.      Heart sounds: No murmur.   Pulmonary:      Breath sounds: Normal breath sounds.   Abdominal:  General: Abdomen is flat. Bowel sounds are normal.      Palpations: Abdomen is soft.      Tenderness: There is generalized tenderness.   Neurological:      Mental Status: He is alert and oriented to person, place, and time.   Psychiatric:         Mood and Affect: Mood normal.         Medical problems and test results were reviewed with the patient today.     No results found for this or any previous visit (from the past 672 hour(s)).      ASSESSMENT and PLAN    Diagnoses and all orders for this visit:    1. Initial Medicare annual wellness visit    2. Diarrhea of presumed infectious origin  -     C DIFFICILE TOXIN A & B BY EIA  -     B.infantis-B.ani-B.long-B.bifi (PROBIOTIC 4X) 10-15 mg TbEC; Take 1 Tab by mouth daily.    3. Colon cancer screening  -     COLOGUARD TEST (FECAL DNA COLORECTAL CANCER SCREENING)    C. Diff will be checked.   Probiotic given.   Cologuard will be ordered.       Follow-up and Dispositions    ?? Return in about 3 months (around 09/24/2018).

## 2018-06-24 NOTE — Patient Instructions (Signed)
Medicare Wellness Visit, Male    The best way to live healthy is to have a lifestyle where you eat a well-balanced diet, exercise regularly, limit alcohol use, and quit all forms of tobacco/nicotine, if applicable.     Regular preventive services are another way to keep healthy. Preventive services (vaccines, screening tests, monitoring & exams) can help personalize your care plan, which helps you manage your own care. Screening tests can find health problems at the earliest stages, when they are easiest to treat.   Home follows the current, evidence-based guidelines published by the [place] [employer] (USPSTF) when recommending preventive services for our patients. Because we follow these guidelines, sometimes recommendations change over time as research supports it. (For example, a prostate screening blood test is no longer routinely recommended for men with no symptoms).  Of course, you and your doctor may decide to screen more often for some diseases, based on your risk and co-morbidities (chronic disease you are already diagnosed with).     Preventive services for you include:  - Medicare offers their members a free annual wellness visit, which is time for you and your primary care provider to discuss and plan for your preventive service needs. Take advantage of this benefit every year!  -All adults over age 62 should receive the recommended pneumonia vaccines. Current USPSTF guidelines recommend a series of two vaccines for the best pneumonia protection.   -All adults should have a flu vaccine yearly and tetanus vaccine every 10 years.  -All adults age 25 and older should receive the shingles vaccines (series of two vaccines).       -All adults age 2-70 who are overweight should have a diabetes screening test once every three years.   -Other screening tests & preventive services for persons with diabetes  include: an eye exam to screen for diabetic retinopathy, a kidney function test, a foot exam, and stricter control over your cholesterol.   -Cardiovascular screening for adults with routine risk involves an electrocardiogram (ECG) at intervals determined by the provider.   -Colorectal cancer screening should be done for adults age 27-75 with no increased risk factors for colorectal cancer.  There are a number of acceptable methods of screening for this type of cancer. Each test has its own benefits and drawbacks. Discuss with your provider what is most appropriate for you during your annual wellness visit. The different tests include: colonoscopy (considered the best screening method), a fecal occult blood test, a fecal DNA test, and sigmoidoscopy.  -All adults born between [place] should be screened once for Hepatitis C.  -An Abdominal Aortic Aneurysm (AAA) Screening is recommended for men age 67-75 who has ever smoked in their lifetime.     Here is a list of your current Health Maintenance items (your personalized list of preventive services) with a due date:  Health Maintenance Due   Topic Date Due   ??? Shingles Vaccine (1 of 2) 10/20/1989   ??? Glaucoma Screening   10/20/2004   ??? Annual Well Visit  01/19/2018   ??? Flu Vaccine  02/26/2018

## 2018-06-24 NOTE — Progress Notes (Signed)
This is an Initial Medicare Annual Wellness Exam (AWV) (Performed 12 months after IPPE or effective date of Medicare Part B enrollment, Once in a lifetime)    I have reviewed the patient's medical history in detail and updated the computerized patient record.     History     Patient Active Problem List   Diagnosis Code   ??? Hypertension I10   ??? Spinal stenosis of lumbar region with neurogenic claudication M48.062   ??? S/P laminectomy Z98.890     Past Medical History:   Diagnosis Date   ??? Hypertension       Past Surgical History:   Procedure Laterality Date   ??? HX APPENDECTOMY  1956   ??? HX CATARACT REMOVAL Bilateral     right done 2018, left 2002   ??? HX LUMBAR LAMINECTOMY  06/09/2018    lami L2-3. L3-4 4-5 Dr. Bradd Canary   ??? HX TONSIL AND ADENOIDECTOMY       Current Outpatient Medications   Medication Sig Dispense Refill   ??? B.infantis-B.ani-B.long-B.bifi (PROBIOTIC 4X) 10-15 mg TbEC Take 1 Tab by mouth daily. 90 Tab 1   ??? diphenoxylate-atropine (LOMOTIL) 2.5-0.025 mg per tablet Take 1 Tab by mouth four (4) times daily as needed for Diarrhea for up to 5 days. Max Daily Amount: 4 Tabs. 20 Tab 0   ??? amLODIPine-benazepril (LOTREL) 2.5-10 mg per capsule Take 1 Cap by mouth daily. 90 Cap 1     No Known Allergies    Family History   Problem Relation Age of Onset   ??? Heart Attack Father    ??? No Known Problems Sister    ??? No Known Problems Brother    ??? No Known Problems Maternal Grandmother    ??? No Known Problems Maternal Grandfather    ??? No Known Problems Paternal Grandmother    ??? No Known Problems Paternal Grandfather      Social History     Tobacco Use   ??? Smoking status: Never Smoker   ??? Smokeless tobacco: Never Used   Substance Use Topics   ??? Alcohol use: Never     Frequency: Never       Depression Risk Factor Screening:     3 most recent PHQ Screens 06/24/2018   Little interest or pleasure in doing things Not at all   Feeling down, depressed, irritable, or hopeless Not at all   Total Score PHQ 2 0        Alcohol Risk Factor Screening (MALE > 65):   Do you average more 1 drink per night or more than 7 drinks a week: No    In the past three months have you have had more than 4 drinks containing alcohol on one occasion: No      Functional Ability and Level of Safety:   Hearing: Hearing is good. Whisper Test done with normal results.    Activities of Daily Living:  The home contains: no safety equipment.  Patient does total self care     Ambulation: with no difficulty    Fall Risk:  Fall Risk Assessment, last 12 mths 06/24/2018   Able to walk? Yes   Fall in past 12 months? No   Fall with injury? -   Number of falls in past 12 months -   Fall Risk Score -       Abuse Screen:  Patient is not abused    Cognitive Screening   Has your family/caregiver stated any concerns about your memory:  no  Cognitive Screening: Normal - Clock Drawing Test, Mini Cog Test    Patient Care Team   Patient Care Team:  Olga Millers, DO as PCP - General (Family Practice)  Blanca Friend, Luberta Mutter, DO as PCP - [facility] Empaneled Provider  Vivia Birmingham, LPN as Care Coordinator    Assessment/Plan   Education and counseling provided:  Are appropriate based on today's review and evaluation    Diagnoses and all orders for this visit:    1. Diarrhea of presumed infectious origin  -     C DIFFICILE TOXIN A & B BY EIA    2. Colon cancer screening  -     COLOGUARD TEST (FECAL DNA COLORECTAL CANCER SCREENING)    Other orders  -     B.infantis-B.ani-B.long-B.bifi (PROBIOTIC 4X) 10-15 mg TbEC; Take 1 Tab by mouth daily.         Health Maintenance Due   Topic Date Due   ??? Shingrix Vaccine Age 70> (1 of 2) 10/20/1989   ??? GLAUCOMA SCREENING Q2Y  10/20/2004   ??? MEDICARE YEARLY EXAM  01/19/2018   ??? Influenza Age 56 to Adult  02/26/2018

## 2018-06-24 NOTE — Progress Notes (Signed)
Pt noticed of results.

## 2018-06-26 LAB — C DIFFICILE TOXIN A & B BY EIA: C. difficile Toxins A+B: NEGATIVE

## 2018-06-26 NOTE — Progress Notes (Signed)
This note will not be viewable in [facility].    Transitions of Care ??? Follow up Outreach Note   Outreach type Phone call: spoke with patient  Home visit:   Date/Time of Outreach: 111/29/2019 147pm     Has patient attended PCP or specialist follow-up appointments since last contact?    What was outcome of appointment?    When is next follow-up scheduled?   Patient states doing ok.  Patient reports f/u appointment with Amie Portland, DO 11/22 with next f/u appointment 11/27. Patient also reports seen Dr. Bradd Canary on 11/25for f/u on surgery, with next appointment on 12/23.     Review medications.    Any medication changes since last outreach?    Does patient have any questions or issues related to their medications?   None stated      Not at this time.     Home health active?  If yes ??? any issue? Progress?   N/ A       Referrals needed?  (CM, SW, HH, etc. )   No    Other issues/Miscellaneous? (Transportation, access to meals, ability to perform ADLs, adequate caregiver support, etc.)  Patient states no other needs or concerns at this time. Patient expressed her appreciation for the f/u call.   Next Outreach Scheduled?    Graduation from program?   No  Yes       Next Steps/Goals (if applicable):   N/A     Outreach completed by:   Vivia Birmingham , LPN  Care Coordinator

## 2018-06-29 ENCOUNTER — Ambulatory Visit: Admit: 2018-06-29 | Discharge: 2018-06-29 | Payer: MEDICARE | Primary: Family Medicine

## 2018-06-29 DIAGNOSIS — M48062 Spinal stenosis, lumbar region with neurogenic claudication: Secondary | ICD-10-CM

## 2018-06-29 NOTE — Progress Notes (Signed)
Subjective:      Burke Terry is a 77 y.o. male who presents today for wound check and rest of staple removal.  Current symptoms: Patient states he has no pain but incision feels very itchy. Patient states he continues with irregularity with his stools and seeing his PCP- Patient never had a colonoscopy screening.  Interventions to date: incision open to air and was instructed not to get wet    Objective:     Visit Vitals  BP 132/72 (BP 1 Location: Left arm, BP Patient Position: Sitting)   Pulse 70   Temp 97.6 ??F (36.4 ??C) (Tympanic)   Ht 6' (1.829 m)   Wt 208 lb 6 oz (94.5 kg)   SpO2 96%   BMI 28.26 kg/m??       Wound:   lumbar incision healing well  With scaly edges. No signs of redness or swelling. Some bruising noted at right sacral area.     Assessment:     Wound check.   Interventions today. Lumbar incision cleansed with betadine using aseptic technique.Rest of staples removed using a staple removal tool. Steri strips applied except for 2 small areas were I was unable to remove scab formation. Patient tolerated well.    Plan:     1 Continue strict restrictions. Not to saturate incision and pat dry  2. Patient instructions were given.  3. Follow up scheduled.

## 2018-07-15 ENCOUNTER — Ambulatory Visit: Admit: 2018-07-15 | Discharge: 2018-07-15 | Payer: MEDICARE | Attending: Neurological Surgery | Primary: Family Medicine

## 2018-07-15 DIAGNOSIS — Z9889 Other specified postprocedural states: Secondary | ICD-10-CM

## 2018-07-15 NOTE — Progress Notes (Signed)
[facility] NOTE:   History of Present Illness:    CC: Post-operative Follow-up Visit     Matthew Sherman is a 77 y.o. male who presents for post-op follow-up visit.  He is now post-op from a lumbar laminectomy, medial facetectomy and foraminotomies at L2-L3, L3-L4 and L4-L5 performed for the treatment of lumbar spinal stenosis with neurogenic claudication on 06/09/2018.  Patient has done well in the postoperative setting with the exception he did have a period of 5 days worth of runny watery loose bowel movements that has all completely resolved at this point currently.  He denies any new issues with healing or fevers sweats or chills.  Patient states that the pain shooting up and down his legs with walking has subsequently resolved he still has some mild numbness and cramping in the toes but states that he feels much better than prior to surgery.     Past Medical History:   Diagnosis Date   ??? Hypertension       Past Surgical History:   Procedure Laterality Date   ??? HX APPENDECTOMY  1956   ??? HX CATARACT REMOVAL Bilateral     right done 2018, left 2002   ??? HX LUMBAR LAMINECTOMY  06/09/2018    lami L2-3. L3-4 4-5 Dr. Bradd Canary   ??? HX TONSIL AND ADENOIDECTOMY       No Known Allergies   Family History   Problem Relation Age of Onset   ??? Heart Attack Father    ??? No Known Problems Sister    ??? No Known Problems Brother    ??? No Known Problems Maternal Grandmother    ??? No Known Problems Maternal Grandfather    ??? No Known Problems Paternal Grandmother    ??? No Known Problems Paternal Grandfather       Social History     Socioeconomic History   ??? Marital status: MARRIED     Spouse name: Not on file   ??? Number of children: Not on file   ??? Years of education: Not on file   ??? Highest education level: Not on file   Occupational History   ??? Not on file   Social Needs   ??? Financial resource strain: Not on file   ??? Food insecurity:     Worry: Not on file     Inability: Not on file    ??? Transportation needs:     Medical: Not on file     Non-medical: Not on file   Tobacco Use   ??? Smoking status: Never Smoker   ??? Smokeless tobacco: Never Used   Substance and Sexual Activity   ??? Alcohol use: Never     Frequency: Never   ??? Drug use: Never   ??? Sexual activity: Not Currently     Partners: Female   Lifestyle   ??? Physical activity:     Days per week: Not on file     Minutes per session: Not on file   ??? Stress: Not on file   Relationships   ??? Social connections:     Talks on phone: Not on file     Gets together: Not on file     Attends religious service: Not on file     Active member of club or organization: Not on file     Attends meetings of clubs or organizations: Not on file     Relationship status: Not on file   ??? Intimate  partner violence:     Fear of current or ex partner: Not on file     Emotionally abused: Not on file     Physically abused: Not on file     Forced sexual activity: Not on file   Other Topics Concern   ??? Not on file   Social History Narrative   ??? Not on file     Current Outpatient Medications   Medication Sig Dispense Refill   ??? B.infantis-B.ani-B.long-B.bifi (PROBIOTIC 4X) 10-15 mg TbEC Take 1 Tab by mouth daily. 90 Tab 1   ??? amLODIPine-benazepril (LOTREL) 2.5-10 mg per capsule Take 1 Cap by mouth daily. 90 Cap 1     Patient Active Problem List   Diagnosis Code   ??? Hypertension I10   ??? Spinal stenosis of lumbar region with neurogenic claudication M48.062   ??? S/P laminectomy Z98.890        Review of Systems: A complete ROS was done and as stated in the HPI or otherwise negative.    Physical Exam:  Vitals:    07/15/18 1354   BP: 150/80   Pulse: 85   Temp: 97.6 ??F (36.4 ??C)   TempSrc: Tympanic   SpO2: 98%   Weight: 208 lb (94.3 kg)   Height: 6' (1.829 m)   PainSc:   0 - No pain   General: No acute distress  Awake, alert, and oriented   Eyes open spontaneously   Face symmetric and tongue mid-line on protrusion   No mid-line cervical, thoracic, or lumbar tenderness to palpation    Patient with strength exam as follows:   Upper Extremities: Right Left      Deltoid  5 5    Biceps  5 5    Triceps 5 5    Grip  5 5     Lower Extremities:      Hip Flex 5 5    Quads  5 5    Hamstrings 5 5    Dorsiflex 5 5    Plantarflex 5 5    EHL  5 5  Sensation grossly intact to light touch  No pathological reflexes  Gait: normal nonantalgic gait, stands from a seated position without difficulty and ambulates independently  Incision: Small area of scab in the middle third of the incision removed with healthy granulation tissue underneath, consistent with only a small area of absence of healing of the superficial epidermis.  No erythema fluctuance or drainage     Assessment & Plan:  Matthew Sherman is a 77 y.o. male who presents for post-op follow-up visit.  He is now post-op from a lumbar laminectomy, medial facetectomy and foraminotomies at L2-L3, L3-L4 and L4-L5 performed for the treatment of lumbar spinal stenosis with neurogenic claudication on 06/09/2018.  He is doing well in the postoperative setting.  I will see him back in 1 month's time for follow-up.      ICD-10-CM ICD-9-CM    1. S/P laminectomy Z98.890 V45.89      Raileigh Sabater R. Bradd Canary, MD  [facility]    Notes are transcribed with Dragon, a medical voice recording dictation service, and may contain minor errors.

## 2018-08-17 ENCOUNTER — Ambulatory Visit: Admit: 2018-08-17 | Discharge: 2018-08-17 | Payer: MEDICARE | Attending: Neurological Surgery | Primary: Family Medicine

## 2018-08-17 DIAGNOSIS — Z9889 Other specified postprocedural states: Secondary | ICD-10-CM

## 2018-08-17 NOTE — Progress Notes (Signed)
[facility] NOTE:   History of Present Illness:    CC: Post-operative Follow-up Visit     Matthew Sherman is a 78 y.o. male who presents for post-op follow-up visit.  He is now post-op from a lumbar laminectomy, medial facetectomy and foraminotomies at L2-L3, L3-L4 and L4-L5 performed for the treatment of lumbar spinal stenosis with neurogenic claudication on 06/09/2018.  The patient has done exceedingly well in the postoperative setting he is now walking up to an hour a day on the treadmill without any issues.  He states prior to surgery he can only walk 10 minutes before having to sit down because he was even to fall secondary to his leg swellings are going to give out.  He denies any fevers, sweats or chills.  He denies any drainage or issues from his wound.  Past Medical History:   Diagnosis Date   ??? Hypertension       Past Surgical History:   Procedure Laterality Date   ??? HX APPENDECTOMY  1956   ??? HX CATARACT REMOVAL Bilateral     right done 2018, left 2002   ??? HX LUMBAR LAMINECTOMY  06/09/2018    lami L2-3. L3-4 4-5 Dr. Bradd Canary   ??? HX TONSIL AND ADENOIDECTOMY       No Known Allergies   Family History   Problem Relation Age of Onset   ??? Heart Attack Father    ??? No Known Problems Sister    ??? No Known Problems Brother    ??? No Known Problems Maternal Grandmother    ??? No Known Problems Maternal Grandfather    ??? No Known Problems Paternal Grandmother    ??? No Known Problems Paternal Grandfather       Social History     Socioeconomic History   ??? Marital status: MARRIED     Spouse name: Not on file   ??? Number of children: Not on file   ??? Years of education: Not on file   ??? Highest education level: Not on file   Occupational History   ??? Not on file   Social Needs   ??? Financial resource strain: Not on file   ??? Food insecurity:     Worry: Not on file     Inability: Not on file   ??? Transportation needs:     Medical: Not on file     Non-medical: Not on file   Tobacco Use    ??? Smoking status: Never Smoker   ??? Smokeless tobacco: Never Used   Substance and Sexual Activity   ??? Alcohol use: Never     Frequency: Never   ??? Drug use: Never   ??? Sexual activity: Not Currently     Partners: Female   Lifestyle   ??? Physical activity:     Days per week: Not on file     Minutes per session: Not on file   ??? Stress: Not on file   Relationships   ??? Social connections:     Talks on phone: Not on file     Gets together: Not on file     Attends religious service: Not on file     Active member of club or organization: Not on file     Attends meetings of clubs or organizations: Not on file     Relationship status: Not on file   ??? Intimate partner violence:     Fear of current or ex partner: Not  on file     Emotionally abused: Not on file     Physically abused: Not on file     Forced sexual activity: Not on file   Other Topics Concern   ??? Not on file   Social History Narrative   ??? Not on file     Current Outpatient Medications   Medication Sig Dispense Refill   ??? amLODIPine-benazepril (LOTREL) 2.5-10 mg per capsule Take 1 Cap by mouth daily. 90 Cap 1     Patient Active Problem List   Diagnosis Code   ??? Hypertension I10   ??? Spinal stenosis of lumbar region with neurogenic claudication M48.062   ??? S/P laminectomy Z98.890      Review of Systems: A complete ROS was done and as stated in the HPI or otherwise negative.    Physical Exam:  Vitals:    08/17/18 1353   BP: 128/59   Pulse: 67   Temp: 97.4 ??F (36.3 ??C)   TempSrc: Tympanic   SpO2: 98%   Weight: 208 lb (94.3 kg)   Height: 6' (1.829 m)   PainSc:   0 - No pain   PainLoc: Back   General: No acute distress  Awake, alert, and oriented   Eyes open spontaneously   Face symmetric and tongue mid-line on protrusion   No mid-line cervical, thoracic, or lumbar tenderness to palpation   Patient with strength exam as follows:   Upper Extremities: Right Left      Deltoid  5 5    Biceps  5 5    Triceps 5 5    Grip  5 5     Lower Extremities:      Hip Flex 5 5     Quads  5 5    Hamstrings 5 5    Dorsiflex 5 5    Plantarflex 5 5    EHL  5 5  Sensation grossly intact to light touch  No pathological reflexes  Gait: normal nonantalgic gait, stands from a seated position without difficulty and ambulates independently  Incision: Small area of scab in the middle third of the incision removed with healthy granulation tissue underneath, once again this area has not entirely healed. Remains superficial when probed with a q-tip, cleaned and dressed with Sterri-Strips and an occlusive Tegaderm.  Nonerythematous without drainage or fluctuance    Assessment & Plan:  Matthew Sherman is a 78 y.o. male who presents for post-op follow-up visit.  He is now post-op from a lumbar laminectomy, medial facetectomy and foraminotomies at L2-L3, L3-L4 and L4-L5 performed for the treatment of lumbar spinal stenosis with neurogenic claudication on 06/09/2018.  He continues to do well in the postoperative setting without issues however still does have a small area in the middle third of his wound that has not completely epithelialized and closed.  I have cleaned it and placed new Steri-Strip dressing over this area to reinforce and take tension off the wound as it is heals with secondary intention.  I will see the patient back in 2 weeks time for a wound check.      ICD-10-CM ICD-9-CM    1. S/P laminectomy Z98.890 V45.89       Matthew Sherman R. Bradd Canary, MD  [facility]    Notes are transcribed with Dragon, a medical voice recording dictation service, and may contain minor errors.

## 2018-08-31 ENCOUNTER — Ambulatory Visit: Admit: 2018-08-31 | Discharge: 2018-08-31 | Payer: MEDICARE | Attending: Neurological Surgery | Primary: Family Medicine

## 2018-08-31 DIAGNOSIS — Z9889 Other specified postprocedural states: Secondary | ICD-10-CM

## 2018-08-31 NOTE — Progress Notes (Signed)
[facility] NOTE:   History of Present Illness:    CC: Post-operative Follow-up Visit     Matthew Sherman is a 78 y.o. male who presents for post-op follow-up visit.  He is now post-op from a lumbar laminectomy, medial facetectomy and foraminotomies at L2-L3, L3-L4 and L4-L5 performed for the treatment of lumbar spinal stenosis with neurogenic claudication on 06/09/2018.  Patient denies any drainage from a small pinhole area of superficial non-epithelialization in the middle aspect of his incision.  He denies any pain and states that he is doing remarkably well in the postoperative setting.  Past Medical History:   Diagnosis Date   ??? Hypertension       Past Surgical History:   Procedure Laterality Date   ??? HX APPENDECTOMY  1956   ??? HX CATARACT REMOVAL Bilateral     right done 2018, left 2002   ??? HX LUMBAR LAMINECTOMY  06/09/2018    lami L2-3. L3-4 4-5 Dr. Bradd Canary   ??? HX TONSIL AND ADENOIDECTOMY       No Known Allergies   Family History   Problem Relation Age of Onset   ??? Heart Attack Father    ??? No Known Problems Sister    ??? No Known Problems Brother    ??? No Known Problems Maternal Grandmother    ??? No Known Problems Maternal Grandfather    ??? No Known Problems Paternal Grandmother    ??? No Known Problems Paternal Grandfather       Social History     Socioeconomic History   ??? Marital status: MARRIED     Spouse name: Not on file   ??? Number of children: Not on file   ??? Years of education: Not on file   ??? Highest education level: Not on file   Occupational History   ??? Not on file   Social Needs   ??? Financial resource strain: Not on file   ??? Food insecurity:     Worry: Not on file     Inability: Not on file   ??? Transportation needs:     Medical: Not on file     Non-medical: Not on file   Tobacco Use   ??? Smoking status: Never Smoker   ??? Smokeless tobacco: Never Used   Substance and Sexual Activity   ??? Alcohol use: Never     Frequency: Never   ??? Drug use: Never    ??? Sexual activity: Not Currently     Partners: Female   Lifestyle   ??? Physical activity:     Days per week: Not on file     Minutes per session: Not on file   ??? Stress: Not on file   Relationships   ??? Social connections:     Talks on phone: Not on file     Gets together: Not on file     Attends religious service: Not on file     Active member of club or organization: Not on file     Attends meetings of clubs or organizations: Not on file     Relationship status: Not on file   ??? Intimate partner violence:     Fear of current or ex partner: Not on file     Emotionally abused: Not on file     Physically abused: Not on file     Forced sexual activity: Not on file   Other Topics Concern   ??? Not on file  Social History Narrative   ??? Not on file     Current Outpatient Medications   Medication Sig Dispense Refill   ??? amLODIPine-benazepril (LOTREL) 2.5-10 mg per capsule Take 1 Cap by mouth daily. 90 Cap 1     Patient Active Problem List   Diagnosis Code   ??? Hypertension I10   ??? Spinal stenosis of lumbar region with neurogenic claudication M48.062   ??? S/P laminectomy Z98.890      Review of Systems: A complete ROS was done and as stated in the HPI or otherwise negative.    Physical Exam:  Vitals:    08/31/18 1533   BP: 122/70   Pulse: 99   Temp: 98 ??F (36.7 ??C)   TempSrc: Tympanic   SpO2: 97%   Weight: 208 lb (94.3 kg)   Height: 6' (1.829 m)   PainSc:   0 - No pain   PainLoc: Back   General: No acute distress  Awake, alert, and oriented   Eyes open spontaneously   Face symmetric and tongue mid-line on protrusion   No mid-line cervical, thoracic, or lumbar tenderness to palpation   Patient with strength exam as follows:   Upper Extremities: Right Left      Deltoid  5 5    Biceps  5 5    Triceps 5 5    Grip  5 5     Lower Extremities:      Hip Flex 5 5    Quads  5 5    Hamstrings 5 5    Dorsiflex 5 5    Plantarflex 5 5    EHL  5 5  Sensation grossly intact to light touch  No pathological reflexes   Gait: normal nonantalgic gait, stands from a seated position without difficulty and ambulates independently  Incision: The previously mentioned small area still has not entirely closed but his remain cleaned and has no evidence of drainage or erythema.  It just lacks a very superficial epithelialization.  When probed with a Q-tip is remains very superficial.  I have redressed it with a Steri-Strip dressing    Assessment & Plan:  Matthew Sherman is a 78 y.o. male who presents for post-op follow-up visit.  He is now post-op from a lumbar laminectomy, medial facetectomy and foraminotomies at L2-L3, L3-L4 and L4-L5 performed for the treatment of lumbar spinal stenosis with neurogenic claudication on 06/09/2018.  The area in the middle third of his incision that is not completely epithelialized has no evidence of erythema or drainage.  I have replaced a Steri-Strips again to remove tension from the wound to allow it to heal with secondary intention.  I have no evidence or concern for infection at this time.  I will see the patient back in 2 months time for follow-up.  Told the patient that he has no further restrictions and he can return back to function as he sees fit at a gradual rate of increase.      ICD-10-CM ICD-9-CM    1. S/P laminectomy Z98.890 V45.89    2. Lumbar stenosis with neurogenic claudication M48.062 724.03    3. Wound dehiscence T81.30XA 998.30       Ritter Helsley R. Bradd Canary, MD  [facility]    Notes are transcribed with Dragon, a medical voice recording dictation service, and may contain minor errors.

## 2018-11-02 ENCOUNTER — Encounter: Attending: Neurological Surgery | Primary: Family Medicine

## 2018-11-08 ENCOUNTER — Telehealth: Payer: MEDICARE

## 2018-11-10 ENCOUNTER — Telehealth: Admit: 2018-11-10 | Payer: MEDICARE | Attending: Family Medicine | Primary: Family Medicine

## 2018-11-10 DIAGNOSIS — I10 Essential (primary) hypertension: Secondary | ICD-10-CM

## 2018-11-10 MED ORDER — AMLODIPINE-BENAZEPRIL 2.5 MG-10 MG CAP
ORAL_CAPSULE | Freq: Every day | ORAL | 1 refills | Status: DC
Start: 2018-11-10 — End: 2019-04-28

## 2018-11-10 MED ORDER — TRIAMCINOLONE ACETONIDE 0.5 % TOPICAL CREAM
0.5 % | Freq: Two times a day (BID) | CUTANEOUS | 0 refills | Status: DC
Start: 2018-11-10 — End: 2019-04-28

## 2018-11-10 NOTE — Progress Notes (Signed)
Consent: [name], who was seen by synchronous (real-time) audio-video technology, and/or his healthcare decision maker, is aware that this patient-initiated, Telehealth encounter on 11/10/2018 is a billable service, with coverage as determined by his insurance carrier. He is aware that he may receive a bill and has provided verbal consent to proceed: Yes.    78 year old Caucasian male here for rash on his left lower leg on the inside.  It is not painful.  It only occurs when he does not have calendula cream on it.  It has not spread.  He did hurt his knee a few days prior to getting the rash, and wondered if that had anything to do with it.  The rash does not have warmth.  It is not swollen.  There is not any vesicles.  He has not been outside except to the mailbox.  He also needs a refill on his hypertension medication.  He is on Lotrel and it has controlled his blood pressure well.    Assessment & Plan:   Diagnoses and all orders for this visit:    1. Essential hypertension  -     amLODIPine-benazepril (LOTREL) 2.5-10 mg per capsule; Take 1 Cap by mouth daily.    2. Dermatitis  -     triamcinolone (ARISTOCORT) 0.5 % topical cream; Apply  to affected area two (2) times a day. use thin layer    Refilled Lotrel.  Given triamcinolone for possible dermatitis.            I spent at least 10 minutes with this established patient, and >50% of the time was spent counseling and/or coordinating care regarding rash, HTN  712  Subjective:   Matthew Sherman is a 78 y.o. male who was seen for Rash and Hypertension      Prior to Admission medications    Medication Sig Start Date End Date Taking? Authorizing Provider   triamcinolone (ARISTOCORT) 0.5 % topical cream Apply  to affected area two (2) times a day. use thin layer 11/10/18  Yes Vietta Bonifield C, DO   amLODIPine-benazepril (LOTREL) 2.5-10 mg per capsule Take 1 Cap by mouth daily. 11/10/18  Yes Sherman Lipuma C, DO    amLODIPine-benazepril (LOTREL) 2.5-10 mg per capsule Take 1 Cap by mouth daily. 06/19/18 11/10/18  Olga Millers, DO     No Known Allergies    Current Outpatient Medications   Medication Sig Dispense Refill   ??? triamcinolone (ARISTOCORT) 0.5 % topical cream Apply  to affected area two (2) times a day. use thin layer 15 g 0   ??? amLODIPine-benazepril (LOTREL) 2.5-10 mg per capsule Take 1 Cap by mouth daily. 27 Cap 1     Past Medical History:   Diagnosis Date   ??? Hypertension        Review of Systems   Constitutional: Negative for chills and fever.   Respiratory: Negative for cough and shortness of breath.    Cardiovascular: Negative for chest pain and palpitations.   Gastrointestinal: Negative for abdominal pain, heartburn, nausea and vomiting.   Skin: Positive for rash.   Neurological: Negative for dizziness and headaches.   Psychiatric/Behavioral: Negative for depression. The patient is not nervous/anxious.          Objective:   There were no vitals taken for this visit.   General: alert, cooperative, no distress   Mental  status: normal mood, behavior, speech, dress, motor activity, and thought processes, able to follow commands   HENT:  NCAT   Neck: no visualized mass   Resp: no respiratory distress   Neuro: no gross deficits   Skin: Red, raised rash on inside of left lower leg    Psychiatric: normal affect, consistent with stated mood, no evidence of hallucinations     Additional exam findings:       We discussed the expected course, resolution and complications of the diagnosis(es) in detail.  Medication risks, benefits, costs, interactions, and alternatives were discussed as indicated.  I advised him to contact the office if his condition worsens, changes or fails to improve as anticipated. He expressed understanding with the diagnosis(es) and plan.       Matthew Sherman is a 78 y.o. male being evaluated by a video visit encounter for concerns as above.  A caregiver was present when  appropriate. Due to this being a Scientist, physiological (During ZOXWR-60 public health emergency), evaluation of the following organ systems was limited: Vitals/Constitutional/EENT/Resp/CV/GI/GU/MS/Neuro/Skin/Heme-Lymph-Imm.  Pursuant to the emergency declaration under the [place], 1135 waiver authority and the [employer] Act, this Virtual  Visit was conducted, with patient's (and/or legal guardian's) consent, to reduce the patient's risk of exposure to COVID-19 and provide necessary medical care.     Services were provided through a video synchronous discussion virtually to substitute for in-person clinic visit.   Patient and provider were located at their individual homes.        Olga Millers, DO

## 2018-12-30 ENCOUNTER — Encounter: Attending: Neurological Surgery | Primary: Family Medicine

## 2019-01-27 ENCOUNTER — Ambulatory Visit: Admit: 2019-01-27 | Discharge: 2019-01-27 | Payer: MEDICARE | Attending: Neurological Surgery | Primary: Family Medicine

## 2019-01-27 DIAGNOSIS — Z9889 Other specified postprocedural states: Secondary | ICD-10-CM

## 2019-01-27 NOTE — Progress Notes (Signed)
[facility] NOTE:   History of Present Illness:    CC: Postoperative follow-up visit    Matthew Sherman is a 78 y.o. male who presents for postoperative follow-up visit.  He is now postop from a lumbar laminectomy, medial facetectomy and foraminotomies at L2-L3, L3-L4 and L4-L5 for the treatment of lumbar spinal stenosis with neurogenic claudication performed on 06/09/2018.  The patient states his wound has entirely healed now and has not had no issues since he was last seen in August 31, 2018.  He denies any significant pain in his low back or lower extremities other than his left knee he felt a pop a few weeks ago and since that time is been nursing the knee back to health.  He states that the knees pain has significantly improved with the use of a heating pad over his left knee.  He said the pain was primarily in the back of the left knee with standing and/or bearing weight.  He denies any other new symptoms or complaints at this time.  He has no new imaging to review and is very pleased with his progress overall in the postoperative setting.    Past Medical History:   Diagnosis Date   ??? Hypertension       Past Surgical History:   Procedure Laterality Date   ??? HX APPENDECTOMY  1956   ??? HX CATARACT REMOVAL Bilateral     right done 2018, left 2002   ??? HX LUMBAR LAMINECTOMY  06/09/2018    lami L2-3. L3-4 4-5 Dr. Bradd Canary   ??? HX TONSIL AND ADENOIDECTOMY       No Known Allergies   Family History   Problem Relation Age of Onset   ??? Heart Attack Father    ??? No Known Problems Sister    ??? No Known Problems Brother    ??? No Known Problems Maternal Grandmother    ??? No Known Problems Maternal Grandfather    ??? No Known Problems Paternal Grandmother    ??? No Known Problems Paternal Grandfather       Social History     Socioeconomic History   ??? Marital status: MARRIED     Spouse name: Not on file   ??? Number of children: Not on file   ??? Years of education: Not on file    ??? Highest education level: Not on file   Occupational History   ??? Not on file   Social Needs   ??? Financial resource strain: Not on file   ??? Food insecurity     Worry: Not on file     Inability: Not on file   ??? Transportation needs     Medical: Not on file     Non-medical: Not on file   Tobacco Use   ??? Smoking status: Never Smoker   ??? Smokeless tobacco: Never Used   Substance and Sexual Activity   ??? Alcohol use: Never     Frequency: Never   ??? Drug use: Never   ??? Sexual activity: Not Currently     Partners: Female   Lifestyle   ??? Physical activity     Days per week: Not on file     Minutes per session: Not on file   ??? Stress: Not on file   Relationships   ??? Social Product manager on phone: Not on file     Gets together: Not on file  Attends religious service: Not on file     Active member of club or organization: Not on file     Attends meetings of clubs or organizations: Not on file     Relationship status: Not on file   ??? Intimate partner violence     Fear of current or ex partner: Not on file     Emotionally abused: Not on file     Physically abused: Not on file     Forced sexual activity: Not on file   Other Topics Concern   ??? Not on file   Social History Narrative   ??? Not on file     Current Outpatient Medications   Medication Sig Dispense Refill   ??? triamcinolone (ARISTOCORT) 0.5 % topical cream Apply  to affected area two (2) times a day. use thin layer 15 g 0   ??? amLODIPine-benazepril (LOTREL) 2.5-10 mg per capsule Take 1 Cap by mouth daily. 90 Cap 1     Patient Active Problem List   Diagnosis Code   ??? Hypertension I10   ??? Spinal stenosis of lumbar region with neurogenic claudication M48.062   ??? S/P laminectomy Z98.890        Review of Systems   Musculoskeletal: Positive for joint pain.        Left knee pain   All other systems reviewed and are negative.    Physical Exam:  Vitals:    01/27/19 1356   BP: (!) 170/92   Pulse: (!) 102   Temp: 97.9 ??F (36.6 ??C)   TempSrc: Temporal   SpO2: 97%    Weight: 219 lb (99.3 kg)   Height: 6' (1.829 m)   PainSc:   0 - No pain   Body mass index is 29.7 kg/m??.  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  No mid-line lumbar tenderness to palpation   Patient with strength exam as follows:   Upper Extremities: Right Left      Deltoid  5 5    Biceps  5 5    Triceps 5 5    Grip  5 5     Lower Extremities:      Hip Flex 5 5    Quads  5 5    Hamstrings 5 5    Dorsiflex 5 5    Plantarflex 5 5    EHL  5 5  Sensation grossly intact to light touch  DTR diminished patellar responses  Gait: normal nonantalgic gait, stands from a seated position without difficulty and ambulates independently    Assessment & Plan:    Matthew Sherman is a 78 y.o. male who presents for postoperative follow-up visit.  He is now postop from a lumbar laminectomy, medial facetectomy and foraminotomies at L2-L3, L3-L4 and L4-L5 for the treatment of lumbar spinal stenosis with neurogenic claudication performed on 06/09/2018.  The patient has done exceedingly well in the postoperative setting.  He denies any new complaints at this time.  He is very pleased with his postoperative performance and that his reoperative back pain and lower extremity pain symptoms as well as his claudication symptoms have completely resolved.  I will  see him back on an as-needed basis.  I recommended that should the patient continue to have knee pain that he would be best to follow-up with his primary care provider to see what further studies need to be performed.      ICD-10-CM ICD-9-CM    1. S/P laminectomy Z98.890 V45.89    2. Lumbar stenosis with neurogenic claudication M48.062 724.03    3. Acute pain of left knee M25.562 719.46       Roda Lauture R. Bradd Canary, [place]     Notes are transcribed with [employer], a medical voice recording dictation service, and may contain minor errors.

## 2019-04-28 ENCOUNTER — Ambulatory Visit: Admit: 2019-04-28 | Discharge: 2019-04-28 | Payer: MEDICARE | Attending: Medical | Primary: Family Medicine

## 2019-04-28 DIAGNOSIS — I10 Essential (primary) hypertension: Secondary | ICD-10-CM

## 2019-04-28 MED ORDER — LISINOPRIL 10 MG TAB
10 mg | ORAL_TABLET | Freq: Every day | ORAL | 1 refills | Status: DC
Start: 2019-04-28 — End: 2019-05-24

## 2019-04-28 NOTE — Telephone Encounter (Signed)
Pt was asked to schedule virtual visit so I could look at the rash. He has a visit 11/10/2018.

## 2019-04-28 NOTE — Progress Notes (Signed)
Patient advised of results and to continue the vitamin D supplement. Patient verbalized understanding on all.

## 2019-04-28 NOTE — Addendum Note (Signed)
Addended by: Amie Portland C on: 04/28/2019 04:54 PM     Modules accepted: Level of Service

## 2019-04-28 NOTE — Progress Notes (Signed)
Please let patient know that his recent labs were essentially normal with exception of low vitamin D level.  He should continue with his daily supplementation.  He noted he recently started this.

## 2019-04-28 NOTE — Patient Instructions (Signed)
*  We will have you stop the amlodipine???benazepril (Lotrel) for now.  To replace this will have you start lisinopril 10 mg once daily.  *Please keep track of your blood pressure. Choose 2-3 days a week, check it first thing in the morning and again later in the day/evening. Write down your results and bring them with you to your next office visit for review. We'd like you to stay < 140/90, and ideally < 130/80. Let us know if you find yourself above this routinely.  *We will review your labs as soon as they are available and we will discuss them further at your follow-up office visit.  *We will arrange the referral to gastroenterology.  You should get a call from them soon to arrange a visit.  If you do not hear from them in the next 7 to 10 days, let us know.  *Continue with your fiber intake.  I would recommend the use of over-the-counter MiraLAX, 1 capful in a drink daily.  *Follow-up in 2 weeks.

## 2019-04-28 NOTE — Progress Notes (Signed)
[facility]  [address] [place] [address]  Phone [phone]  Fax:  [phone]    Patient: Matthew Sherman  Date of Birth: 23-Feb-1941  Age 78 y.o.  Sex male  MEDICAL RECORD NUMBER [id number]  Visit Date: 04/30/19  Author:  Sueanne Margarita, PA-C    Family Practice Clinic Note    Chief Complaint   Patient presents with   ??? Constipation       History of Present Illness  This is a 78 year old male who presents today with complaints of bowel changes which have been ongoing since he underwent L-spine surgery in November 2019.  He has had bouts of constipation and diarrhea since that time.  Previously never had any issues with either.  Notes that since the day he left the hospital he has had persistent abdominal bloating and intermittent discomfort.  He has been using over-the-counter laxatives intermittently.  He did have some antibiotics while he was in the hospital so a stool study for C. difficile was obtained and he also had a Cologuard test performed both of which were negative.  He had an abdominal x-ray performed which was unremarkable.  He has been using a probiotic routinely and notes that the probiotic did seem to help with the diarrhea but once that subsided he discontinued it.  He has been using some over-the-counter products such as Beano and Phazyme to help with the bloating but has not seen much improvement with this and the addition of fiber to his diet.  During seeing more constipation in the last few weeks.  His appetite has been decreased although he is trying to eat regularly.  His activity levels have gone down somewhat simply because of the COVID pandemic.  He has concerns about potential celiac disease.  He would like to have some lab work to evaluate this.  Also would be open to a gastroenterology referral.  He has had intermittent fatigue and dizziness but denies near syncope or syncope.  He does try to get some exercise with use of a  treadmill at home.  He also remains concerned that some of these symptoms may be side effects of his blood pressure medication (Lotrel).  His blood pressure has been consistently less than 140/90 on home checks.  He denies any low readings.  Denies fever or chills.  No nausea or vomiting.  Denies hematochezia or melena.  He has not experienced any chest pain or shortness of breath.      Past History:    Past Medical history   Past Medical History:   Diagnosis Date   ??? Hypertension        Current Problem List:   Patient Active Problem List   Diagnosis Code   ??? Hypertension I10   ??? Spinal stenosis of lumbar region with neurogenic claudication M48.062   ??? S/P laminectomy Z98.890       Current Medications:   Current Outpatient Medications   Medication Sig Dispense   ??? lisinopriL (PRINIVIL, ZESTRIL) 10 mg tablet Take 1 Tab by mouth daily. 90 Tab     No current facility-administered medications for this visit.        Allergies:No Known Allergies    Surgical History:  Past Surgical History:   Procedure Laterality Date   ??? HX APPENDECTOMY  1956   ??? HX CATARACT REMOVAL Bilateral     right done 2018, left 2002   ??? HX LUMBAR LAMINECTOMY  06/09/2018    lami L2-3. L3-4 4-5  Dr. Bradd Canary   ??? HX TONSIL AND ADENOIDECTOMY         Family History:  Family History   Problem Relation Age of Onset   ??? Heart Attack Father    ??? No Known Problems Sister    ??? No Known Problems Brother    ??? No Known Problems Maternal Grandmother    ??? No Known Problems Maternal Grandfather    ??? No Known Problems Paternal Grandmother    ??? No Known Problems Paternal Grandfather        Social History:   Social History     Social History Narrative   ??? Not on file      Social History     Socioeconomic History   ??? Marital status: MARRIED     Spouse name: Not on file   ??? Number of children: Not on file   ??? Years of education: Not on file   ??? Highest education level: Not on file   Occupational History   ??? Not on file   Social Needs    ??? Financial resource strain: Not on file   ??? Food insecurity     Worry: Not on file     Inability: Not on file   ??? Transportation needs     Medical: Not on file     Non-medical: Not on file   Tobacco Use   ??? Smoking status: Never Smoker   ??? Smokeless tobacco: Never Used   Substance and Sexual Activity   ??? Alcohol use: Never     Frequency: Never   ??? Drug use: Never   ??? Sexual activity: Not Currently     Partners: Female   Lifestyle   ??? Physical activity     Days per week: Not on file     Minutes per session: Not on file   ??? Stress: Not on file   Relationships   ??? Social Product manager on phone: Not on file     Gets together: Not on file     Attends religious service: Not on file     Active member of club or organization: Not on file     Attends meetings of clubs or organizations: Not on file     Relationship status: Not on file   ??? Intimate partner violence     Fear of current or ex partner: Not on file     Emotionally abused: Not on file     Physically abused: Not on file     Forced sexual activity: Not on file   Other Topics Concern   ??? Not on file   Social History Narrative   ??? Not on file         ROS  Review of Systems   Constitutional: Positive for fatigue. Negative for chills and fever.   HENT: Negative for congestion.    Respiratory: Negative for shortness of breath.    Cardiovascular: Negative for chest pain.   Gastrointestinal: Positive for abdominal distention, abdominal pain, constipation and diarrhea. Negative for blood in stool, nausea and vomiting.   Genitourinary: Negative for dysuria and frequency.   Neurological: Positive for dizziness. Negative for syncope and headaches.   Psychiatric/Behavioral: Negative for confusion.         Visit Vitals  BP 130/78 (BP 1 Location: Left arm, BP Patient Position: Sitting)   Pulse 94   Temp 97.7 ??F (36.5 ??C) (Temporal)   Resp 16   Ht 6' (1.829  m)   Wt 210 lb 6.4 oz (95.4 kg)   SpO2 95%   BMI 28.54 kg/m??     Body mass index is 28.54 kg/m??.     Physical Exam     Physical Exam  Constitutional:       General: He is not in acute distress.     Appearance: Normal appearance. He is not ill-appearing.   HENT:      Head: Normocephalic.      Right Ear: External ear normal.      Left Ear: External ear normal.      Nose: Nose normal.      Mouth/Throat:      Pharynx: Oropharynx is clear.   Eyes:      Conjunctiva/sclera: Conjunctivae normal.   Neck:      Musculoskeletal: Neck supple.   Cardiovascular:      Rate and Rhythm: Normal rate and regular rhythm.      Heart sounds: Normal heart sounds.   Pulmonary:      Effort: Pulmonary effort is normal.      Breath sounds: Normal breath sounds.   Abdominal:      General: Bowel sounds are normal. There is distension (Mild bloating noted.  No appreciable ascites).      Palpations: Abdomen is soft. There is no mass.      Tenderness: There is no abdominal tenderness. There is no right CVA tenderness, left CVA tenderness, guarding or rebound.   Musculoskeletal: Normal range of motion.      Comments: Trace edema bilateral lower extremities.   Lymphadenopathy:      Cervical: No cervical adenopathy.   Skin:     General: Skin is warm and dry.   Neurological:      Mental Status: He is alert.   Psychiatric:         Mood and Affect: Mood normal.         Behavior: Behavior normal.         Thought Content: Thought content normal.         ASSESSMENT & PLAN  Encounter Diagnoses     ICD-10-CM ICD-9-CM   1. Essential hypertension  I10 401.9   2. Constipation, unspecified constipation type  K59.00 564.00   3. Abdominal bloating  R14.0 787.3   4. Lower extremity edema  R60.0 782.3       1. Essential hypertension  *Blood pressure appears to be well controlled by today's reading however it is conceivable that patient may be experiencing some side effects of the medication.  We will have him hold Lotrel for now and begin plain lisinopril 10 mg once a day.  He is to continue to monitor his blood pressure at home and let us know if his readings are consistently above  140/90.  Bring readings to next visit for review.    - CBC WITH AUTOMATED DIFF  - METABOLIC PANEL, COMPREHENSIVE  - lisinopriL (PRINIVIL, ZESTRIL) 10 mg tablet; Take 1 Tab by mouth daily.  Dispense: 90 Tab; Refill: 1    2. Constipation, unspecified constipation type  *We will obtain some labs to include a CBC, CMP, TSH and celiac panel for further evaluation.  *We will arrange referral to gastroenterology for evaluation.  *May try over-the-counter MiraLAX daily as needed for constipation.  *Continue with probiotic the same for now.    - CBC WITH AUTOMATED DIFF  - METABOLIC PANEL, COMPREHENSIVE  - TSH 3RD GENERATION  - CELIAC ANTIBODY PROFILE  - REFERRAL TO GASTROENTEROLOGY  -  VITAMIN D, 25 HYDROXY  - VITAMIN B12 & FOLATE    3. Abdominal bloating  *As above  - CELIAC ANTIBODY PROFILE  - REFERRAL TO GASTROENTEROLOGY  - VITAMIN D, 25 HYDROXY  - VITAMIN B12 & FOLATE    4. Lower extremity edema  *Intermittent issue.  May be related to the amlodipine component of his blood pressure medication.  We have made a switch as documented above.      Orders Placed This Encounter   ??? CBC WITH AUTOMATED DIFF   ??? METABOLIC PANEL, COMPREHENSIVE   ??? TSH, 3RD GENERATION   ??? CELIAC ANTIBODY PROFILE   ??? VITAMIN D, 25 HYDROXY   ??? VITAMIN B12 & FOLATE   ??? GASTROENTEROLOGY     Referral Priority:   Routine     Referral Type:   Consultation     Referral Reason:   Specialty Services Required     Requested Specialty:   Gastroenterology     Number of Visits Requested:   1   ??? lisinopriL (PRINIVIL, ZESTRIL) 10 mg tablet     Sig: Take 1 Tab by mouth daily.     Dispense:  90 Tab     Refill:  1     I have reviewed the patient's past medical history, social history and family history and vitals.  We have discussed treatment plan and follow up and given patient instructions.  Patient's questions are answered and we will follow up as indicated.      Dictated using voice recognition software.  Proof read but unrecognized errors may exist.     Follow-up and Dispositions    ?? Return in about 3 weeks (around 05/19/2019) for 3-week follow-up.         Sueanne Margarita, PA-C

## 2019-04-30 LAB — CBC WITH AUTOMATED DIFF
ABS. BASOPHILS: 0.1 10*3/uL (ref 0.0–0.2)
ABS. EOSINOPHILS: 0.1 10*3/uL (ref 0.0–0.4)
ABS. IMM. GRANS.: 0.1 10*3/uL (ref 0.0–0.1)
ABS. MONOCYTES: 1.2 10*3/uL — ABNORMAL HIGH (ref 0.1–0.9)
ABS. NEUTROPHILS: 8.3 10*3/uL — ABNORMAL HIGH (ref 1.4–7.0)
Abs Lymphocytes: 1.3 10*3/uL (ref 0.7–3.1)
BASOPHILS: 1 %
EOSINOPHILS: 1 %
HCT: 48.9 % (ref 37.5–51.0)
HGB: 16.2 g/dL (ref 13.0–17.7)
IMMATURE GRANULOCYTES: 1 %
Lymphocytes: 12 %
MCH: 27.6 pg (ref 26.6–33.0)
MCHC: 33.1 g/dL (ref 31.5–35.7)
MCV: 83 fL (ref 79–97)
MONOCYTES: 11 %
NEUTROPHILS: 74 %
PLATELET: 237 10*3/uL (ref 150–450)
RBC: 5.87 x10E6/uL — ABNORMAL HIGH (ref 4.14–5.80)
RDW: 13.2 % (ref 11.6–15.4)
WBC: 10.9 10*3/uL — ABNORMAL HIGH (ref 3.4–10.8)

## 2019-04-30 LAB — VITAMIN B12 & FOLATE
Folate: 11.9 ng/mL (ref >3.0)
Vitamin B12: 349 pg/mL (ref 232–1245)

## 2019-04-30 LAB — METABOLIC PANEL, COMPREHENSIVE
A-G Ratio: 1.5 (ref 1.2–2.2)
ALT (SGPT): 45 IU/L — ABNORMAL HIGH (ref 0–44)
AST (SGOT): 28 IU/L (ref 0–40)
Albumin: 3.5 g/dL — ABNORMAL LOW (ref 3.7–4.7)
Alk. phosphatase: 98 IU/L (ref 39–117)
BUN/Creatinine ratio: 11 (ref 10–24)
BUN: 9 mg/dL (ref 8–27)
Bilirubin, total: 0.7 mg/dL (ref 0.0–1.2)
CO2: 20 mmol/L (ref 20–29)
Calcium: 8.8 mg/dL (ref 8.6–10.2)
Chloride: 104 mmol/L (ref 96–106)
Creatinine: 0.84 mg/dL (ref 0.76–1.27)
GFR est AA: 96 mL/min/{1.73_m2} (ref >59)
GFR est non-AA: 83 mL/min/{1.73_m2} (ref >59)
GLOBULIN, TOTAL: 2.3 g/dL (ref 1.5–4.5)
Glucose: 110 mg/dL — ABNORMAL HIGH (ref 65–99)
Potassium: 4.5 mmol/L (ref 3.5–5.2)
Protein, total: 5.8 g/dL — ABNORMAL LOW (ref 6.0–8.5)
Sodium: 140 mmol/L (ref 134–144)

## 2019-04-30 LAB — CELIAC ANTIBODY PROFILE
Deamidated Gliadin Ab, IgA: 4 units (ref 0–19)
Deamidated Gliadin Ab, IgG: 3 units (ref 0–19)
Immunoglobulin A, Qt.: 175 mg/dL (ref 61–437)
t-Transglutaminase, IgA: <2 U/mL (ref 0–3)
t-Transglutaminase, IgG: <2 U/mL (ref 0–5)

## 2019-04-30 LAB — TSH 3RD GENERATION: TSH: 2.62 u[IU]/mL (ref 0.450–4.500)

## 2019-04-30 LAB — VITAMIN D, 25 HYDROXY: VITAMIN D, 25-HYDROXY: 22.1 ng/mL — ABNORMAL LOW (ref 30.0–100.0)

## 2019-05-15 ENCOUNTER — Emergency Department: Admit: 2019-05-15 | Payer: MEDICARE | Primary: Family Medicine

## 2019-05-15 ENCOUNTER — Inpatient Hospital Stay
Admit: 2019-05-15 | Discharge: 2019-05-24 | Disposition: A | Discharge status: Home Health | Payer: MEDICARE | Attending: Family Medicine | Admitting: Family Medicine

## 2019-05-15 ENCOUNTER — Inpatient Hospital Stay: Payer: MEDICARE | Attending: Emergency Medicine | Primary: Family Medicine

## 2019-05-15 ENCOUNTER — Inpatient Hospital Stay: Admit: 2019-05-15 | Payer: MEDICARE | Primary: Family Medicine

## 2019-05-15 DIAGNOSIS — C259 Malignant neoplasm of pancreas, unspecified: Secondary | ICD-10-CM

## 2019-05-15 LAB — CBC WITH AUTOMATED DIFF
ABS. BASOPHILS: 0.1 10*3/uL (ref 0.0–0.2)
ABS. EOSINOPHILS: 0 10*3/uL (ref 0.0–0.8)
ABS. IMM. GRANS.: 0.2 10*3/uL (ref 0.0–0.5)
ABS. LYMPHOCYTES: 1.3 10*3/uL (ref 0.5–4.6)
ABS. MONOCYTES: 1.9 10*3/uL — ABNORMAL HIGH (ref 0.1–1.3)
ABS. NEUTROPHILS: 15.5 10*3/uL — ABNORMAL HIGH (ref 1.7–8.2)
ABSOLUTE NRBC: 0 10*3/uL (ref 0.0–0.2)
BASOPHILS: 0 % (ref 0.0–2.0)
EOSINOPHILS: 0 % — ABNORMAL LOW (ref 0.5–7.8)
HCT: 54.6 % — ABNORMAL HIGH (ref 41.1–50.3)
HGB: 17.7 g/dL — ABNORMAL HIGH (ref 13.6–17.2)
IMMATURE GRANULOCYTES: 1 % (ref 0.0–5.0)
LYMPHOCYTES: 7 % — ABNORMAL LOW (ref 13–44)
MCH: 27 PG (ref 26.1–32.9)
MCHC: 32.4 g/dL (ref 31.4–35.0)
MCV: 83.4 FL (ref 79.6–97.8)
MONOCYTES: 10 % (ref 4.0–12.0)
MPV: 9.3 FL — ABNORMAL LOW (ref 9.4–12.3)
NEUTROPHILS: 82 % — ABNORMAL HIGH (ref 43–78)
PLATELET: 269 10*3/uL (ref 150–450)
RBC: 6.55 M/uL — ABNORMAL HIGH (ref 4.23–5.6)
RDW: 13 % (ref 11.9–14.6)
WBC: 18.9 10*3/uL — ABNORMAL HIGH (ref 4.3–11.1)

## 2019-05-15 LAB — EKG, 12 LEAD, INITIAL
Atrial Rate: 97 {beats}/min
Calculated P Axis: 53 degrees
Calculated R Axis: -177 degrees
Calculated T Axis: -13 degrees
P-R Interval: 228 ms
Q-T Interval: 374 ms
QRS Duration: 140 ms
QTC Calculation (Bezet): 474 ms
Ventricular Rate: 97 {beats}/min

## 2019-05-15 LAB — LIPASE: Lipase: 42 U/L — ABNORMAL LOW (ref 73–393)

## 2019-05-15 LAB — METABOLIC PANEL, COMPREHENSIVE
A-G Ratio: 0.6 — ABNORMAL LOW (ref 1.2–3.5)
ALT (SGPT): 97 U/L — ABNORMAL HIGH (ref 12–65)
AST (SGOT): 50 U/L — ABNORMAL HIGH (ref 15–37)
Albumin: 2.9 g/dL — ABNORMAL LOW (ref 3.2–4.6)
Alk. phosphatase: 105 U/L (ref 50–136)
Anion gap: 8 mmol/L (ref 7–16)
BUN: 11 MG/DL (ref 8–23)
Bilirubin, total: 0.9 MG/DL (ref 0.2–1.1)
CO2: 28 mmol/L (ref 21–32)
Calcium: 9.3 MG/DL (ref 8.3–10.4)
Chloride: 98 mmol/L (ref 98–107)
Creatinine: 1.1 MG/DL (ref 0.8–1.5)
GFR est AA: >60 mL/min/{1.73_m2} (ref >60)
GFR est non-AA: >60 mL/min/{1.73_m2} (ref >60)
Globulin: 4.5 g/dL — ABNORMAL HIGH (ref 2.3–3.5)
Glucose: 133 mg/dL — ABNORMAL HIGH (ref 65–100)
Potassium: 3.8 mmol/L (ref 3.5–5.1)
Protein, total: 7.4 g/dL (ref 6.3–8.2)
Sodium: 134 mmol/L — ABNORMAL LOW (ref 136–145)

## 2019-05-15 LAB — TROPONIN-HIGH SENSITIVITY
Troponin-High Sensitivity: 28.7 pg/mL — ABNORMAL HIGH (ref 0–14)
Troponin-High Sensitivity: 26.9 pg/mL — ABNORMAL HIGH (ref 0–14)

## 2019-05-15 LAB — LACTIC ACID: Lactic acid: 2 MMOL/L (ref 0.4–2.0)

## 2019-05-15 LAB — NT-PRO BNP: NT pro-BNP: 3986 PG/ML — ABNORMAL HIGH (ref <450)

## 2019-05-15 MED ORDER — HEPARIN (PORCINE) 5,000 UNIT/ML IJ SOLN
5000 unit/mL | Freq: Once | INTRAMUSCULAR | Status: AC
Start: 2019-05-15 — End: 2019-05-15
  Administered 2019-05-15: 23:00:00 via INTRAVENOUS

## 2019-05-15 MED ORDER — POLYETHYLENE GLYCOL 3350 17 GRAM (100 %) ORAL POWDER PACKET
17 gram | Freq: Every day | ORAL | Status: DC | PRN
Start: 2019-05-15 — End: 2019-05-24

## 2019-05-15 MED ORDER — TUBERCULIN PPD 5 UNIT/0.1 ML INTRADERMAL
5 tub. unit /0.1 mL | Freq: Once | INTRADERMAL | Status: AC
Start: 2019-05-15 — End: 2019-05-16

## 2019-05-15 MED ORDER — ENOXAPARIN 100 MG/ML SUB-Q SYRINGE
100 mg/mL | SUBCUTANEOUS | Status: DC
Start: 2019-05-15 — End: 2019-05-15
  Administered 2019-05-15: 22:00:00 via SUBCUTANEOUS

## 2019-05-15 MED ORDER — IOPAMIDOL 76 % IV SOLN
76 % | Freq: Once | INTRAVENOUS | Status: AC
Start: 2019-05-15 — End: 2019-05-15
  Administered 2019-05-15: 21:00:00 via INTRAVENOUS

## 2019-05-15 MED ORDER — HEPARIN (PORCINE) IN D5W 25,000 UNIT/500 ML IV
25000 unit/500 mL (50 unit/mL) | INTRAVENOUS | Status: AC
Start: 2019-05-15 — End: 2019-05-19
  Administered 2019-05-15 – 2019-05-19 (×11): via INTRAVENOUS

## 2019-05-15 MED ORDER — SODIUM CHLORIDE 0.9 % IJ SYRG
Freq: Once | INTRAMUSCULAR | Status: AC
Start: 2019-05-15 — End: 2019-05-15
  Administered 2019-05-15: 21:00:00 via INTRAVENOUS

## 2019-05-15 MED ORDER — SODIUM CHLORIDE 0.9 % IV
INTRAVENOUS | Status: DC
Start: 2019-05-15 — End: 2019-05-19
  Administered 2019-05-16 – 2019-05-18 (×8): via INTRAVENOUS

## 2019-05-15 MED ORDER — SODIUM CHLORIDE 0.9 % IJ SYRG
Freq: Three times a day (TID) | INTRAMUSCULAR | Status: DC
Start: 2019-05-15 — End: 2019-05-24
  Administered 2019-05-16 – 2019-05-24 (×27): via INTRAVENOUS

## 2019-05-15 MED ORDER — SODIUM CHLORIDE 0.9% BOLUS IV
0.9 % | Freq: Once | INTRAVENOUS | Status: AC
Start: 2019-05-15 — End: 2019-05-15
  Administered 2019-05-15: 21:00:00 via INTRAVENOUS

## 2019-05-15 MED ORDER — ACETAMINOPHEN 325 MG TABLET
325 mg | Freq: Four times a day (QID) | ORAL | Status: DC | PRN
Start: 2019-05-15 — End: 2019-05-24
  Administered 2019-05-16 – 2019-05-21 (×2): via ORAL

## 2019-05-15 MED ORDER — ONDANSETRON (PF) 4 MG/2 ML INJECTION
4 mg/2 mL | Freq: Four times a day (QID) | INTRAMUSCULAR | Status: DC | PRN
Start: 2019-05-15 — End: 2019-05-24
  Administered 2019-05-16: 12:00:00 via INTRAVENOUS

## 2019-05-15 MED ORDER — ACETAMINOPHEN 650 MG RECTAL SUPPOSITORY
650 mg | Freq: Four times a day (QID) | RECTAL | Status: DC | PRN
Start: 2019-05-15 — End: 2019-05-24

## 2019-05-15 MED ORDER — SODIUM CHLORIDE 0.9 % IJ SYRG
INTRAMUSCULAR | Status: DC | PRN
Start: 2019-05-15 — End: 2019-05-24

## 2019-05-15 MED FILL — HEPARIN (PORCINE) IN D5W 25,000 UNIT/500 ML IV: 25000 unit/500 mL (50 unit/mL) | INTRAVENOUS | Qty: 500

## 2019-05-15 MED FILL — HEPARIN (PORCINE) 5,000 UNIT/ML IJ SOLN: 5000 unit/mL | INTRAMUSCULAR | Qty: 2

## 2019-05-15 MED FILL — LOVENOX 100 MG/ML SUBCUTANEOUS SYRINGE: 100 mg/mL | SUBCUTANEOUS | Qty: 1

## 2019-05-15 NOTE — ED Provider Notes (Addendum)
HPI   78 year old male history of hypertension presents to the ED with 2 complaints.  He reports he has been short of breath for the past few days.  No fevers.  No cough.  No known Covid exposures.  No chest pain.  No known history of CAD CHF VHD or cardiac arrhythmias.  Denies hemoptysis.  Denies lower extremity edema.    Patient has also had ongoing issues with abdominal pain.  Reportedly saw his GI physician this week.  He has been rather constipated although he states he had a bowel movement this morning.  Denies nausea vomiting or diarrhea.  No blood per rectum.  Apparently has a colonoscopy scheduled for October 28.    Past Medical History:   Diagnosis Date   ??? Hypertension        Past Surgical History:   Procedure Laterality Date   ??? HX APPENDECTOMY  1956   ??? HX CATARACT REMOVAL Bilateral     right done 2018, left 2002   ??? HX LUMBAR LAMINECTOMY  06/09/2018    lami L2-3. L3-4 4-5 Dr. Bradd Canary   ??? HX TONSIL AND ADENOIDECTOMY           Family History:   Problem Relation Age of Onset   ??? Heart Attack Father    ??? No Known Problems Sister    ??? No Known Problems Brother    ??? No Known Problems Maternal Grandmother    ??? No Known Problems Maternal Grandfather    ??? No Known Problems Paternal Grandmother    ??? No Known Problems Paternal Grandfather        Social History     Socioeconomic History   ??? Marital status: MARRIED     Spouse name: Not on file   ??? Number of children: Not on file   ??? Years of education: Not on file   ??? Highest education level: Not on file   Occupational History   ??? Not on file   Social Needs   ??? Financial resource strain: Not on file   ??? Food insecurity     Worry: Not on file     Inability: Not on file   ??? Transportation needs     Medical: Not on file     Non-medical: Not on file   Tobacco Use   ??? Smoking status: Never Smoker   ??? Smokeless tobacco: Never Used   Substance and Sexual Activity   ??? Alcohol use: Never     Frequency: Never   ??? Drug use: Never   ??? Sexual activity: Not Currently      Partners: Female   Lifestyle   ??? Physical activity     Days per week: Not on file     Minutes per session: Not on file   ??? Stress: Not on file   Relationships   ??? Social Product manager on phone: Not on file     Gets together: Not on file     Attends religious service: Not on file     Active member of club or organization: Not on file     Attends meetings of clubs or organizations: Not on file     Relationship status: Not on file   ??? Intimate partner violence     Fear of current or ex partner: Not on file     Emotionally abused: Not on file     Physically abused: Not on file     Forced sexual  activity: Not on file   Other Topics Concern   ??? Not on file   Social History Narrative   ??? Not on file         ALLERGIES: Patient has no known allergies.    Review of Systems  Review of Systems Negative Except as in HPI    Vitals:    05/15/19 1401   BP: (!) 151/84   Pulse: 98   Resp: 18   Temp: 97.4 ??F (36.3 ??C)   SpO2: 94%   Weight: 95.3 kg (210 lb)   Height: 6' (1.829 m)            Physical Exam  Vitals signs and nursing note reviewed.   Constitutional:       Comments: Somewhat chronically ill-appearing but acutely nontoxic.  No acute distress.   HENT:      Mouth/Throat:      Mouth: Mucous membranes are moist.   Neck:      Vascular: No JVD.   Cardiovascular:      Rate and Rhythm: Normal rate and regular rhythm.   Pulmonary:      Effort: Pulmonary effort is normal.      Comments: Decreased breath sounds in the bases with some scattered rales in the bases  Abdominal:      General: Bowel sounds are normal. There is distension.      Palpations: Abdomen is soft.      Tenderness: There is generalized abdominal tenderness. There is no guarding. Negative signs include Murphy's sign and McBurney's sign.      Comments: No palpable incarcerated hernia   Musculoskeletal:      Comments: Neck and back grossly unremarkable. No acute gross extremity abnormalities noted.  No palpable cords in the lower extremities.   Skin:      General: Skin is warm.      Findings: No rash.   Neurological:      Comments: Awake, alert. GCS 15. CN II-XII grossly intact. Speech clear. No gross lateralizing neuro deficits.    Psychiatric:         Behavior: Behavior normal.          MDM  Number of Diagnoses or Management Options  Diagnosis management comments: Patient with pancreatic tumor and multiple bilateral pulmonary emboli.  Will admit for further treatment.         Procedures      EKG performed at 1405.  Sinus rhythm at 97 with first-degree AV block.  Right bundle branch block.  Inferior Q waves.  No STEMI.  Old anterolateral infarct.  QTc 474      Signout Note/Transition of Care  The patient was signed out to the oncoming attending at shift change, at this time pending the results of studies, and for final disposition.  Please see their additional documentation for the rest of the ED course.  Current plan is follow-up on labs imaging and disposition.           Please note, this chart was dictated using Dragon dictation, voice recognition software.  While efforts were made to correct any transcription errors, some may inadvertently remain.  Please forgive punctuation and typographic/voice recognition errors.  Please contact me with any questions concerns or for clarification of documentation.        CT CHEST ABD PELV W CONT (Preliminary result)   Result time 05/15/19 17:45:44   Procedure changed from CT ABD PELV W CONT   ED Interpretation by Avel Peace, MD (05/15/19  17:45:44, [facility], Radiology)     IMPRESSION:   1. ??Primary pancreas tumor.  2. ??Peritoneal carcinomatosis and ascites.  3. ??Multiple bilateral pulmonary emboli.            ??      Results Include:    Recent Results (from the past 24 hour(s))   EKG, 12 LEAD, INITIAL    Collection Time: 05/15/19  2:05 PM   Result Value Ref Range    Ventricular Rate 97 BPM    Atrial Rate 97 BPM    P-R Interval 228 ms    QRS Duration 140 ms    Q-T Interval 374 ms    QTC Calculation (Bezet) 474 ms     Calculated P Axis 53 degrees    Calculated R Axis -177 degrees    Calculated T Axis -13 degrees    Diagnosis       Sinus rhythm with 1st degree A-V block with Premature atrial complexes  Right bundle branch block  Inferior infarct , age undetermined  Anterolateral infarct , age undetermined  Abnormal ECG  No previous ECGs available  Confirmed by GRABARCZYK  MD ([facility]), MARK A ([id number]) on 05/15/2019 4:06:06 PM     CBC WITH AUTOMATED DIFF    Collection Time: 05/15/19  2:16 PM   Result Value Ref Range    WBC 18.9 (H) 4.3 - 11.1 K/uL    RBC 6.55 (H) 4.23 - 5.6 M/uL    HGB 17.7 (H) 13.6 - 17.2 g/dL    HCT 54.6 (H) 41.1 - 50.3 %    MCV 83.4 79.6 - 97.8 FL    MCH 27.0 26.1 - 32.9 PG    MCHC 32.4 31.4 - 35.0 g/dL    RDW 13.0 11.9 - 14.6 %    PLATELET 269 150 - 450 K/uL    MPV 9.3 (L) 9.4 - 12.3 FL    ABSOLUTE NRBC 0.00 0.0 - 0.2 K/uL    DF AUTOMATED      NEUTROPHILS 82 (H) 43 - 78 %    LYMPHOCYTES 7 (L) 13 - 44 %    MONOCYTES 10 4.0 - 12.0 %    EOSINOPHILS 0 (L) 0.5 - 7.8 %    BASOPHILS 0 0.0 - 2.0 %    IMMATURE GRANULOCYTES 1 0.0 - 5.0 %    ABS. NEUTROPHILS 15.5 (H) 1.7 - 8.2 K/UL    ABS. LYMPHOCYTES 1.3 0.5 - 4.6 K/UL    ABS. MONOCYTES 1.9 (H) 0.1 - 1.3 K/UL    ABS. EOSINOPHILS 0.0 0.0 - 0.8 K/UL    ABS. BASOPHILS 0.1 0.0 - 0.2 K/UL    ABS. IMM. GRANS. 0.2 0.0 - 0.5 K/UL   METABOLIC PANEL, COMPREHENSIVE    Collection Time: 05/15/19  2:16 PM   Result Value Ref Range    Sodium 134 (L) 136 - 145 mmol/L    Potassium 3.8 3.5 - 5.1 mmol/L    Chloride 98 98 - 107 mmol/L    CO2 28 21 - 32 mmol/L    Anion gap 8 7 - 16 mmol/L    Glucose 133 (H) 65 - 100 mg/dL    BUN 11 8 - 23 MG/DL    Creatinine 1.10 0.8 - 1.5 MG/DL    GFR est AA >60 >60 ml/min/1.69m    GFR est non-AA >60 >60 ml/min/1.741m   Calcium 9.3 8.3 - 10.4 MG/DL    Bilirubin, total 0.9 0.2 - 1.1 MG/DL    ALT (SGPT) 97 (H)  12 - 65 U/L    AST (SGOT) 50 (H) 15 - 37 U/L    Alk. phosphatase 105 50 - 136 U/L    Protein, total 7.4 6.3 - 8.2 g/dL    Albumin 2.9 (L) 3.2 - 4.6 g/dL     Globulin 4.5 (H) 2.3 - 3.5 g/dL    A-G Ratio 0.6 (L) 1.2 - 3.5     TROPONIN-HIGH SENSITIVITY    Collection Time: 05/15/19  2:16 PM   Result Value Ref Range    Troponin-High Sensitivity 28.7 (H) 0 - 14 pg/mL   NT-PRO BNP    Collection Time: 05/15/19  2:16 PM   Result Value Ref Range    NT pro-BNP 3,986 (H) <450 PG/ML   LIPASE    Collection Time: 05/15/19  2:16 PM   Result Value Ref Range    Lipase 42 (L) 73 - 393 U/L   SARS-COV-2    Collection Time: 05/15/19  3:27 PM   Result Value Ref Range    Specimen source Nasopharyngeal      COVID-19 rapid test Not detected NOTD      SARS CoV-2 PENDING    LACTIC ACID    Collection Time: 05/15/19  3:27 PM   Result Value Ref Range    Lactic acid 2.0 0.4 - 2.0 MMOL/L

## 2019-05-15 NOTE — ED Triage Notes (Signed)
Pt ambulatory to triage wearing mask from home.  States he has been shob for the last couple of days.  Seen at GI doctor this week.  GI MD states he has not movement at all in his lower intestines.  Scheduled for colonoscopy on the 28th of October.  Denies nausea, vomiting, or diarrhea.

## 2019-05-15 NOTE — ED Notes (Signed)
Report to Kia,RN to assume care at this time.

## 2019-05-15 NOTE — Other (Incomplete)
TRANSFER - OUT REPORT:    Verbal report given to ***(name) on Matthew Sherman  being transferred to ***(unit) for {TRANSFER CARE:18410}       Report consisted of patient???s Situation, Background, Assessment and   Recommendations(SBAR).     Information from the following report(s) {SBAR REPORTS AL:538233 was reviewed with the receiving nurse.    Lines:   Peripheral IV 05/15/19 Left Antecubital (Active)        Opportunity for questions and clarification was provided.      Patient transported with:   {TRANSPORTDETAILS:22609}

## 2019-05-15 NOTE — Progress Notes (Signed)
On heparin Drip.  PTT elevated which is expected.  Per protocol will go with Xa level.  No active bleeding.

## 2019-05-15 NOTE — Other (Signed)
TRANSFER - IN REPORT:    Verbal report received from Kia, RN on Matthew Sherman being received from ED    Report consisted of patient???s Situation, Background, Assessment and Recommendations(SBAR).     Information from the following report(s) SBAR, ED Summary, [facility] and Recent Results was reviewed. Opportunity for questions and clarification was provided.      Assessment completed upon patient???s arrival to unit and care assumed.     Patient received to room 321. Patient connected to monitor and assessment completed. Plan of care reviewed. Patient oriented to room and call light. Patient aware to use call light to communicate any chest pain or needs.

## 2019-05-15 NOTE — H&P (Signed)
Hospitalist H&P Note     Admit Date:  05/15/2019  2:14 PM   Name:  Matthew Sherman   Age:  78 y.o.  DOB:  Feb 09, 1941   MRN:  [id number]   PCP:  Olga Millers, DO  Treatment Team: Attending Provider: Orion Modest, MD; Primary Nurse: Teodora Medici, RN    HPI:     CC:  Shortness of breath         Mr. Matthew Sherman is a 78 yo male with PMH of HTN and Lumbar laminectomy who is evaluated with progressive shortness of breath. He was seen by GI last week for abdominal distention, anorexia and scheduled for EGD/ colonoscopy. He reports some loose, jelly like BM for several months. No rectal bleeding. No weight loss. No chest pain. He has anorexia.   CT Chest/ AP done in the ED and shows primary pancreas tumor, peritoneal carcinomatosis and bilateral PE on preliminary report.   His daughter Matthew Sherman is present and I also discussed case with daughter 9 via phone.    10 systems reviewed and negative except as noted in HPI.- denies ear or throat pains, no arthritis, no confusion or memory loss, no edema        Past Medical History:   Diagnosis Date   ??? Hypertension       Past Surgical History:   Procedure Laterality Date   ??? HX APPENDECTOMY  1956   ??? HX CATARACT REMOVAL Bilateral     right done 2018, left 2002   ??? HX LUMBAR LAMINECTOMY  06/09/2018    lami L2-3. L3-4 4-5 Dr. Bradd Canary   ??? HX TONSIL AND ADENOIDECTOMY        No Known Allergies   Social History     Tobacco Use   ??? Smoking status: Never Smoker   ??? Smokeless tobacco: Never Used   Substance Use Topics   ??? Alcohol use: Never     Frequency: Never      Family History   Problem Relation Age of Onset   ??? Heart Attack Father    ??? No Known Problems Sister    ??? No Known Problems Brother    ??? No Known Problems Maternal Grandmother    ??? No Known Problems Maternal Grandfather    ??? No Known Problems Paternal Grandmother    ??? No Known Problems Paternal Grandfather       There is no immunization history for the selected administration types on  file for this patient.  PTA Medications:  Prior to Admission Medications   Prescriptions Last Dose Informant Patient Reported? Taking?   lisinopriL (PRINIVIL, ZESTRIL) 10 mg tablet Not Taking at Unknown time  No No   Sig: Take 1 Tab by mouth daily.      Facility-Administered Medications: None       Objective:     Patient Vitals for the past 24 hrs:   Temp Pulse Resp BP SpO2   05/15/19 1758 ??? 99 ??? 127/73 100 %   05/15/19 1401 97.4 ??F (36.3 ??C) 98 18 (!) 151/84 94 %     Oxygen Therapy  O2 Sat (%): 100 % (05/15/19 1758)  Pulse via Oximetry: 99 beats per minute (05/15/19 1758)  O2 Device: Room air (05/15/19 1401)  No intake or output data in the 24 hours ending 05/15/19 1842    Physical Exam:  General:    Alert.  No distress, elderly, no distress   Eyes:   Normal sclera.  Extraocular  movements intact. PERRLA  ENT:  Normocephalic, atraumatic.  Irritated and dry posterior oral mucous membranes  CV:   RRR.  No m/r/g.  . No edema  Lungs:  CTAB.  No wheezing, rhonchi, or rales. anterior  Abdomen: Distended, decreased BS, nontender   Extremities: Warm and dry. .  Neurologic:  grossly intact.  Skin:     No rashes or jaundice.  Normal coloration  Psych:  Normal mood and affect.    I reviewed the labs, imaging, EKGs, telemetry, and other studies done this admission.    EKG tracing personally reviewed as NSR with first degree AV block    Data Review:   Recent Results (from the past 24 hour(s))   EKG, 12 LEAD, INITIAL    Collection Time: 05/15/19  2:05 PM   Result Value Ref Range    Ventricular Rate 97 BPM    Atrial Rate 97 BPM    P-R Interval 228 ms    QRS Duration 140 ms    Q-T Interval 374 ms    QTC Calculation (Bezet) 474 ms    Calculated P Axis 53 degrees    Calculated R Axis -177 degrees    Calculated T Axis -13 degrees    Diagnosis       Sinus rhythm with 1st degree A-V block with Premature atrial complexes  Right bundle branch block  Inferior infarct , age undetermined  Anterolateral infarct , age undetermined   Abnormal ECG  No previous ECGs available  Confirmed by GRABARCZYK  MD ([facility]), MARK A ([id number]) on 05/15/2019 4:06:06 PM     CBC WITH AUTOMATED DIFF    Collection Time: 05/15/19  2:16 PM   Result Value Ref Range    WBC 18.9 (H) 4.3 - 11.1 K/uL    RBC 6.55 (H) 4.23 - 5.6 M/uL    HGB 17.7 (H) 13.6 - 17.2 g/dL    HCT 54.6 (H) 41.1 - 50.3 %    MCV 83.4 79.6 - 97.8 FL    MCH 27.0 26.1 - 32.9 PG    MCHC 32.4 31.4 - 35.0 g/dL    RDW 13.0 11.9 - 14.6 %    PLATELET 269 150 - 450 K/uL    MPV 9.3 (L) 9.4 - 12.3 FL    ABSOLUTE NRBC 0.00 0.0 - 0.2 K/uL    DF AUTOMATED      NEUTROPHILS 82 (H) 43 - 78 %    LYMPHOCYTES 7 (L) 13 - 44 %    MONOCYTES 10 4.0 - 12.0 %    EOSINOPHILS 0 (L) 0.5 - 7.8 %    BASOPHILS 0 0.0 - 2.0 %    IMMATURE GRANULOCYTES 1 0.0 - 5.0 %    ABS. NEUTROPHILS 15.5 (H) 1.7 - 8.2 K/UL    ABS. LYMPHOCYTES 1.3 0.5 - 4.6 K/UL    ABS. MONOCYTES 1.9 (H) 0.1 - 1.3 K/UL    ABS. EOSINOPHILS 0.0 0.0 - 0.8 K/UL    ABS. BASOPHILS 0.1 0.0 - 0.2 K/UL    ABS. IMM. GRANS. 0.2 0.0 - 0.5 K/UL   METABOLIC PANEL, COMPREHENSIVE    Collection Time: 05/15/19  2:16 PM   Result Value Ref Range    Sodium 134 (L) 136 - 145 mmol/L    Potassium 3.8 3.5 - 5.1 mmol/L    Chloride 98 98 - 107 mmol/L    CO2 28 21 - 32 mmol/L    Anion gap 8 7 - 16 mmol/L    Glucose 133 (H) 65 -  100 mg/dL    BUN 11 8 - 23 MG/DL    Creatinine 1.10 0.8 - 1.5 MG/DL    GFR est AA >60 >60 ml/min/1.21m    GFR est non-AA >60 >60 ml/min/1.758m   Calcium 9.3 8.3 - 10.4 MG/DL    Bilirubin, total 0.9 0.2 - 1.1 MG/DL    ALT (SGPT) 97 (H) 12 - 65 U/L    AST (SGOT) 50 (H) 15 - 37 U/L    Alk. phosphatase 105 50 - 136 U/L    Protein, total 7.4 6.3 - 8.2 g/dL    Albumin 2.9 (L) 3.2 - 4.6 g/dL    Globulin 4.5 (H) 2.3 - 3.5 g/dL    A-G Ratio 0.6 (L) 1.2 - 3.5     TROPONIN-HIGH SENSITIVITY    Collection Time: 05/15/19  2:16 PM   Result Value Ref Range    Troponin-High Sensitivity 28.7 (H) 0 - 14 pg/mL   NT-PRO BNP    Collection Time: 05/15/19  2:16 PM   Result Value Ref Range     NT pro-BNP 3,986 (H) <450 PG/ML   LIPASE    Collection Time: 05/15/19  2:16 PM   Result Value Ref Range    Lipase 42 (L) 73 - 393 U/L   SARS-COV-2    Collection Time: 05/15/19  3:27 PM   Result Value Ref Range    Specimen source Nasopharyngeal      COVID-19 rapid test Not detected NOTD      SARS CoV-2 PENDING    LACTIC ACID    Collection Time: 05/15/19  3:27 PM   Result Value Ref Range    Lactic acid 2.0 0.4 - 2.0 MMOL/L   TROPONIN-HIGH SENSITIVITY    Collection Time: 05/15/19  4:40 PM   Result Value Ref Range    Troponin-High Sensitivity 26.9 (H) 0 - 14 pg/mL       All Micro Results     Procedure Component Value Units Date/Time    CULTURE, BLOOD [[id number]]ollected:  05/15/19 1528    Order Status:  Completed Specimen:  Blood Updated:  05/15/19 1631    CULTURE, BLOOD [[id number]]ollected:  05/15/19 1527    Order Status:  Completed Specimen:  Blood Updated:  05/15/19 1631          Other Studies:  Xr Abd Acute W 1 V Chest    Result Date: 05/15/2019  Abdominal Series INDICATION:  Shortness of breath and abdominal pain A single view of the chest and 2 views of the abdomen were obtained. FINDINGS: There are stable mild chronic change in the left lung base.  There are no infiltrates or effusions. The heart size is normal. Flat and upright views of the abdomen show multiple dilated loops of bowel, mostly colon.  There is no free air.  No renal calculi are seen.     IMPRESSION:  1.  Distended bowel, ileus versus distal obstruction. 2.  No acute findings in the chest.     Ct Chest Abd Pelv W Cont    Result Date: 05/15/2019  CT of the Chest, Abdomen, and Pelvis INDICATION: Shortness of breath and abdominal pain Multiple axial images were obtained through the chest, abdomen, and pelvis. Oral contrast was used for bowel opacification.  10084mf Isovue 370 intravenous contrast was used for better evaluation of solid organs and vascular structures.  Radiation dose reduction techniques  were used for this study.  All CT scans performed at this facility use one or all  of the following: Automated exposure control, adjustment of the mA and/or kVp according to patient's size, iterative reconstruction. COMPARISON: None FINDINGS: -LUNGS: Minimal bibasilar infiltrate/atelectasis, left greater than right.  Tiny bilateral pleural effusions are also present.  No definite pulmonary mass. -MEDIASTINUM/AXILLA: No significant adenopathy. -HEART/VESSELS: There are multiple bilateral pulmonary emboli.  Mild atherosclerosis.  Heart size is normal. -CHEST WALL: Normal. -LIVER: Normal in size and appearance.  -GALLBLADDER/BILE DUCTS: Gallbladder is collapsed.  No bile duct dilatation. -PANCREAS: There is a 2.2 cm mass in the body of the pancreas.  Masses compressing the splenic vein.  Pancreatic duct is dilated. -SPLEEN: Normal. -ADRENALS:  Normal. -KIDNEYS/URETERS: 6.7 cm cyst in the lower pole the left kidney.  No hydronephrosis or significant mass. -BLADDER: Normal. -REPRODUCTIVE ORGANS: No pelvic masses. -BOWEL: Mild distention of the transverse colon.  No inflammatory changes. -LYMPH NODES: No significant retroperitoneal, mesenteric, or pelvic adenopathy. -BONES: No fracture or significant bone lesion. -VASCULATURE: Normal -OTHER: There is peritoneal carcinomatosis with associated ascites.  Fluid is most prominent in the upper left abdomen.      IMPRESSION: 1.  Primary pancreas tumor. 2.  Peritoneal carcinomatosis and ascites. 3.  Multiple bilateral pulmonary emboli. Critical results called to Dr.  Delora Fuel  at 5:45 PM 789 If there are any questions about this report, I can be reached on [employer] or at [phone]       Assessment and Plan:     Hospital Problems as of 05/15/2019 Date Reviewed: 05/08/2019          Codes Class Noted - Resolved POA    Pulmonary emboli ([place]) ICD-10-CM: I26.99  ICD-9-CM: 415.19  05/15/2019 - Present Yes        Pancreatic mass ICD-10-CM: K86.89  ICD-9-CM: 577.8  05/15/2019 - Present Yes         Leukocytosis ICD-10-CM: D72.829  ICD-9-CM: 288.60  05/15/2019 - Present Yes        Polycythemia ICD-10-CM: D75.1  ICD-9-CM: 238.4  05/15/2019 - Present Yes        Hypertension ICD-10-CM: I10  ICD-9-CM: 401.9  Unknown - Present Yes              ?? Bilateral PE:  ?? Admit to remote tele   ?? IV heparin drip  ?? ECHO and LE duplex  ?? O2 as needed, currently on Room air       ?? Pancreatic tumor:  ?? Consult GI and oncology  ?? Diet as tolerant pending consults       ?? HTN:  ?? Holding lisinopril as he states he has been recently normotensive       ?? Polycythemia:  ?? Hydrate and reassess CBC       ?? Leukocytosis:  ?? Check UA  ?? followup CBC as may be stress reaction      ?? Elevated LFTS:  ?? Repeat lab    Discharge planning:  pending  DVT ppx: IV heparin  Code status:  Full  Estimated LOS:  Greater than 2 midnights  Risk:  high  Care plan: daughter 13 [phone]  Signed:  Orion Modest, MD

## 2019-05-15 NOTE — Progress Notes (Signed)
Problem: Falls - Risk of  Goal: *Absence of Falls  Description: Document Patrcia Dolly Fall Risk and appropriate interventions in the flowsheet.  Outcome: Progressing Towards Goal  Note: Fall Risk Interventions:            Medication Interventions: Patient to call before getting OOB, Teach patient to arise slowly, Evaluate medications/consider consulting pharmacy         History of Falls Interventions: Vital signs minimum Q4HRs X 24 hrs (comment for end date), Room close to nurse's station, Investigate reason for fall

## 2019-05-15 NOTE — Progress Notes (Signed)
Admission skin assessment completed with second RN and reveals the following: Sacrum and heels are intact with no signs of breakdown. Skin is cool, flaky, and pale. Scattered healing scars and scabs present on bilateral lower and upper extremities.

## 2019-05-16 LAB — CBC WITH AUTOMATED DIFF
ABS. BASOPHILS: 0.1 10*3/uL (ref 0.0–0.2)
ABS. BASOPHILS: 0 10*3/uL (ref 0.0–0.2)
ABS. EOSINOPHILS: 0 10*3/uL (ref 0.0–0.8)
ABS. EOSINOPHILS: 0 10*3/uL (ref 0.0–0.8)
ABS. IMM. GRANS.: 0.2 10*3/uL (ref 0.0–0.5)
ABS. IMM. GRANS.: 0.2 10*3/uL (ref 0.0–0.5)
ABS. LYMPHOCYTES: 1.4 10*3/uL (ref 0.5–4.6)
ABS. LYMPHOCYTES: 1 10*3/uL (ref 0.5–4.6)
ABS. MONOCYTES: 1.5 10*3/uL — ABNORMAL HIGH (ref 0.1–1.3)
ABS. MONOCYTES: 1.6 10*3/uL — ABNORMAL HIGH (ref 0.1–1.3)
ABS. NEUTROPHILS: 15.2 10*3/uL — ABNORMAL HIGH (ref 1.7–8.2)
ABS. NEUTROPHILS: 14.9 10*3/uL — ABNORMAL HIGH (ref 1.7–8.2)
ABSOLUTE NRBC: 0 10*3/uL (ref 0.0–0.2)
ABSOLUTE NRBC: 0 10*3/uL (ref 0.0–0.2)
BASOPHILS: 0 % (ref 0.0–2.0)
BASOPHILS: 0 % (ref 0.0–2.0)
EOSINOPHILS: 0 % — ABNORMAL LOW (ref 0.5–7.8)
EOSINOPHILS: 0 % — ABNORMAL LOW (ref 0.5–7.8)
HCT: 51.2 % — ABNORMAL HIGH (ref 41.1–50.3)
HCT: 46.1 % (ref 41.1–50.3)
HGB: 16.9 g/dL (ref 13.6–17.2)
HGB: 15.6 g/dL (ref 13.6–17.2)
IMMATURE GRANULOCYTES: 1 % (ref 0.0–5.0)
IMMATURE GRANULOCYTES: 1 % (ref 0.0–5.0)
LYMPHOCYTES: 8 % — ABNORMAL LOW (ref 13–44)
LYMPHOCYTES: 6 % — ABNORMAL LOW (ref 13–44)
MCH: 27.2 PG (ref 26.1–32.9)
MCH: 27.5 PG (ref 26.1–32.9)
MCHC: 33 g/dL (ref 31.4–35.0)
MCHC: 33.8 g/dL (ref 31.4–35.0)
MCV: 82.3 FL (ref 79.6–97.8)
MCV: 81.2 FL (ref 79.6–97.8)
MONOCYTES: 8 % (ref 4.0–12.0)
MONOCYTES: 9 % (ref 4.0–12.0)
MPV: 10.2 FL (ref 9.4–12.3)
MPV: 9.6 FL (ref 9.4–12.3)
NEUTROPHILS: 83 % — ABNORMAL HIGH (ref 43–78)
NEUTROPHILS: 84 % — ABNORMAL HIGH (ref 43–78)
PLATELET: 235 10*3/uL (ref 150–450)
PLATELET: 217 10*3/uL (ref 150–450)
RBC: 6.22 M/uL — ABNORMAL HIGH (ref 4.23–5.6)
RBC: 5.68 M/uL — ABNORMAL HIGH (ref 4.23–5.6)
RDW: 13 % (ref 11.9–14.6)
RDW: 12.7 % (ref 11.9–14.6)
WBC: 18.4 10*3/uL — ABNORMAL HIGH (ref 4.3–11.1)
WBC: 17.7 10*3/uL — ABNORMAL HIGH (ref 4.3–11.1)

## 2019-05-16 LAB — METABOLIC PANEL, BASIC
Anion gap: 9 mmol/L (ref 7–16)
BUN: 12 MG/DL (ref 8–23)
CO2: 25 mmol/L (ref 21–32)
Calcium: 8.4 MG/DL (ref 8.3–10.4)
Chloride: 103 mmol/L (ref 98–107)
Creatinine: 0.69 MG/DL — ABNORMAL LOW (ref 0.8–1.5)
GFR est AA: >60 mL/min/{1.73_m2} (ref >60)
GFR est non-AA: >60 mL/min/{1.73_m2} (ref >60)
Glucose: 133 mg/dL — ABNORMAL HIGH (ref 65–100)
Potassium: 3.9 mmol/L (ref 3.5–5.1)
Sodium: 137 mmol/L (ref 136–145)

## 2019-05-16 LAB — SARS-COV-2
COVID-19 rapid test: NOT DETECTED
SARS CoV-2: NEGATIVE

## 2019-05-16 LAB — HEPATIC FUNCTION PANEL
A-G Ratio: 0.6 — ABNORMAL LOW (ref 1.2–3.5)
ALT (SGPT): 66 U/L — ABNORMAL HIGH (ref 12–65)
AST (SGOT): 35 U/L (ref 15–37)
Albumin: 2.1 g/dL — ABNORMAL LOW (ref 3.2–4.6)
Alk. phosphatase: 80 U/L (ref 50–136)
Bilirubin, direct: 0.2 mg/dL (ref <0.4)
Bilirubin, total: 0.7 mg/dL (ref 0.2–1.1)
Globulin: 3.5 g/dL (ref 2.3–3.5)
Protein, total: 5.6 g/dL — ABNORMAL LOW (ref 6.3–8.2)

## 2019-05-16 LAB — HEPARIN XA UFH
Heparin Xa UFH: 0.67 IU/mL (ref 0.3–0.7)
Heparin Xa UFH: 0.55 IU/mL (ref 0.3–0.7)
Heparin Xa UFH: 0.48 IU/mL (ref 0.3–0.7)

## 2019-05-16 LAB — PTT: aPTT: 198.7 s — CR (ref 24.3–35.4)

## 2019-05-16 LAB — CEA: CEA: 2.5 ng/mL (ref 0.0–3.0)

## 2019-05-16 MED ORDER — PANTOPRAZOLE 40 MG TAB, DELAYED RELEASE
40 mg | Freq: Every day | ORAL | Status: DC
Start: 2019-05-16 — End: 2019-05-16
  Administered 2019-05-16: 12:00:00 via ORAL

## 2019-05-16 MED ORDER — SODIUM CHLORIDE 0.9 % INJECTION
20 mg/2 mL | Freq: Two times a day (BID) | INTRAMUSCULAR | Status: DC
Start: 2019-05-16 — End: 2019-05-24
  Administered 2019-05-17 – 2019-05-24 (×16): via INTRAVENOUS

## 2019-05-16 MED ORDER — CALCIUM CARBONATE 200 MG (500 MG) CHEWABLE TAB
200 mg calcium (500 mg) | Freq: Four times a day (QID) | ORAL | Status: AC | PRN
Start: 2019-05-16 — End: 2019-05-17
  Administered 2019-05-16 – 2019-05-17 (×2): via ORAL

## 2019-05-16 MED FILL — PANTOPRAZOLE 40 MG TAB, DELAYED RELEASE: 40 mg | ORAL | Qty: 1

## 2019-05-16 MED FILL — HEPARIN (PORCINE) IN D5W 25,000 UNIT/500 ML IV: 25000 unit/500 mL (50 unit/mL) | INTRAVENOUS | Qty: 500

## 2019-05-16 MED FILL — MAPAP (ACETAMINOPHEN) 325 MG TABLET: 325 mg | ORAL | Qty: 2

## 2019-05-16 MED FILL — CALCIUM CARBONATE 200 MG (500 MG) CHEWABLE TAB: 200 mg calcium (500 mg) | ORAL | Qty: 1

## 2019-05-16 MED FILL — ONDANSETRON (PF) 4 MG/2 ML INJECTION: 4 mg/2 mL | INTRAMUSCULAR | Qty: 2

## 2019-05-16 MED FILL — POLYETHYLENE GLYCOL 3350 17 GRAM (100 %) ORAL POWDER PACKET: 17 gram | ORAL | Qty: 1

## 2019-05-16 MED FILL — SODIUM CHLORIDE 0.9 % IV: INTRAVENOUS | Qty: 1000

## 2019-05-16 NOTE — Consults (Addendum)
H&P/Consult Note/Progress Note/Office Note:   Matthew Sherman  MRN: [id number]  DOB:[birth year]  Age:78 y.o.     General Surgery Consult ordered by: Delbert Phenix, NP  Reason for General Surgery Consult: Pancreatic Mass    HPI: Matthew Sherman is a 78 y.o. male with PMH of HTN, who is seen in consultation at the request of Delbert Phenix, NP for pancreatic mass noted on 05/15/19 CT. Pt was admitted 10/17 for progressive SOB. Pt reports he has been experiencing intermittent worsening abdominal pain, distention, and anorexia over the past year.   Pt also c/o N/V this am.     05/15/19 CT Chest/Abd/Pelvis  IMPRESSION:   1.  Primary pancreas tumor.  2.  Peritoneal carcinomatosis and ascites.  3.  Multiple bilateral pulmonary emboli.      Past Medical History:   Diagnosis Date   ??? Hypertension      Past Surgical History:   Procedure Laterality Date   ??? HX APPENDECTOMY  1956   ??? HX CATARACT REMOVAL Bilateral     right done 2018, left 2002   ??? HX LUMBAR LAMINECTOMY  06/09/2018    lami L2-3. L3-4 4-5 Dr. Bradd Canary   ??? HX TONSIL AND ADENOIDECTOMY       Current Facility-Administered Medications   Medication Dose Route Frequency   ??? pantoprazole (PROTONIX) tablet 40 mg  40 mg Oral ACB   ??? calcium carbonate (TUMS) chewable tablet 200 mg [elemental]  200 mg Oral Q6H PRN   ??? heparin 25,000 units in dextrose 500 mL infusion  18-36 Units/kg/hr IntraVENous TITRATE   ??? sodium chloride (NS) flush 5-40 mL  5-40 mL IntraVENous Q8H   ??? sodium chloride (NS) flush 5-40 mL  5-40 mL IntraVENous PRN   ??? acetaminophen (TYLENOL) tablet 650 mg  650 mg Oral Q6H PRN    Or   ??? acetaminophen (TYLENOL) suppository 650 mg  650 mg Rectal Q6H PRN   ??? polyethylene glycol (MIRALAX) packet 17 g  17 g Oral DAILY PRN   ??? ondansetron (ZOFRAN) injection 4 mg  4 mg IntraVENous Q6H PRN   ??? tuberculin injection 5 Units  5 Units IntraDERMal ONCE   ??? 0.9% sodium chloride infusion  75 mL/hr IntraVENous CONTINUOUS     Patient has no known allergies.   Social History     Socioeconomic History   ??? Marital status: MARRIED     Spouse name: Not on file   ??? Number of children: Not on file   ??? Years of education: Not on file   ??? Highest education level: Not on file   Tobacco Use   ??? Smoking status: Never Smoker   ??? Smokeless tobacco: Never Used   Substance and Sexual Activity   ??? Alcohol use: Never     Frequency: Never   ??? Drug use: Never   ??? Sexual activity: Not Currently     Partners: Female     Social History     Tobacco Use   Smoking Status Never Smoker   Smokeless Tobacco Never Used     Family History   Problem Relation Age of Onset   ??? Heart Attack Father    ??? No Known Problems Sister    ??? No Known Problems Brother    ??? No Known Problems Maternal Grandmother    ??? No Known Problems Maternal Grandfather    ??? No Known Problems Paternal Grandmother    ??? No Known Problems Paternal Grandfather  ROS: Comprehensive review of systems was otherwise unremarkable except as noted above.    Physical Exam:   Visit Vitals  BP (!) 146/75 (BP 1 Location: Left arm, BP Patient Position: At rest)   Pulse 87   Temp 98.2 ??F (36.8 ??C)   Resp 20   Ht 6' (1.829 m)   Wt 207 lb 4.8 oz (94 kg)   SpO2 97%   BMI 28.11 kg/m??     Vitals:    05/16/19 0452 05/16/19 0830 05/16/19 1200 05/16/19 1233   BP: 137/88 135/80  (!) 146/75   Pulse: 88 91  87   Resp: '18 20  20   ' Temp: 97.5 ??F (36.4 ??C) 98.4 ??F (36.9 ??C)  98.2 ??F (36.8 ??C)   SpO2: 97% 96% 99% 97%   Weight: 207 lb 4.8 oz (94 kg)      Height:         No intake/output data recorded.  10/16 1901 - 10/18 0700  In: 180 [P.O.:180]  Out: 200 [Urine:200]    Constitutional: Alert, oriented, cooperative   Eyes:Sclera are clear. EOMs intact  ENMT: no external lesions gross hearing normal; no obvious neck masses, no ear or lip lesions, nares normal  CV: RRR. Normal perfusion  Resp: No JVD.  Breathing is  non-labored; no audible wheezing.    GI: tight and distended, generalized ttp    Musculoskeletal: No edema or cyanosis.   Neuro: no focal deficits   Psychiatric: normal affect and mood, no memory impairment    Recent vitals (if inpt):  Patient Vitals for the past 24 hrs:   BP Temp Pulse Resp SpO2 Weight   05/16/19 1233 (!) 146/75 98.2 ??F (36.8 ??C) 87 20 97 % ???   05/16/19 1200 ??? ??? ??? ??? 99 % ???   05/16/19 0830 135/80 98.4 ??F (36.9 ??C) 91 20 96 % ???   05/16/19 0452 137/88 97.5 ??F (36.4 ??C) 88 18 97 % 207 lb 4.8 oz (94 kg)   05/16/19 0045 126/81 97.7 ??F (36.5 ??C) 86 18 96 % ???   05/15/19 2023 (!) 166/87 97.4 ??F (36.3 ??C) 95 18 98 % 209 lb 3.2 oz (94.9 kg)   05/15/19 1828 (!) 145/79 ??? 93 ??? 98 % ???   05/15/19 1758 127/73 ??? 99 ??? 100 % ???       Labs:  Recent Labs     05/16/19  0104 05/15/19  2048 05/15/19  1416   WBC 17.7* 18.4* 18.9*   HGB 15.6 16.9 17.7*   PLT 217 235 269   NA 137  --  134*   K 3.9  --  3.8   CL 103  --  98   CO2 25  --  28   BUN 12  --  11   CREA 0.69*  --  1.10   GLU 133*  --  133*   APTT  --  198.7*  --    TBILI 0.7  --  0.9   CBIL 0.2  --   --    ALT 66*  --  97*   AP 80  --  105   LPSE  --   --  42*       Lab Results   Component Value Date/Time    WBC 17.7 (H) 05/16/2019 01:04 AM    HGB 15.6 05/16/2019 01:04 AM    PLATELET 217 05/16/2019 01:04 AM    Sodium 137 05/16/2019 01:04 AM    Potassium 3.9  05/16/2019 01:04 AM    Chloride 103 05/16/2019 01:04 AM    CO2 25 05/16/2019 01:04 AM    BUN 12 05/16/2019 01:04 AM    Creatinine 0.69 (L) 05/16/2019 01:04 AM    Glucose 133 (H) 05/16/2019 01:04 AM    aPTT 198.7 (HH) 05/15/2019 08:48 PM    Bilirubin, total 0.7 05/16/2019 01:04 AM    Bilirubin, direct 0.2 05/16/2019 01:04 AM    ALT (SGPT) 66 (H) 05/16/2019 01:04 AM    Alk. phosphatase 80 05/16/2019 01:04 AM    Lipase 42 (L) 05/15/2019 02:16 PM       CT Results  (Last 48 hours)               05/15/19 1727  CT CHEST ABD PELV W CONT Final result    Impression:  IMPRESSION:    1.  Primary pancreas tumor.   2.  Peritoneal carcinomatosis and ascites.   3.  Multiple bilateral pulmonary emboli.       Critical results called to Dr.  Delora Fuel  at 5:45 PM       789        If there are any questions about this report, I can be reached on [employer]   or at [phone]               Narrative:  CT of the Chest, Abdomen, and Pelvis       INDICATION: Shortness of breath and abdominal pain       Multiple axial images were obtained through the chest, abdomen, and pelvis.    Oral contrast was used for bowel opacification.  163m of Isovue 370 intravenous   contrast was used for better evaluation of solid organs and vascular structures.    Radiation dose reduction techniques were used for this study.  All CT scans   performed at this facility use one or all of the following: Automated exposure   control, adjustment of the mA and/or kVp according to patient's size, iterative   reconstruction.       COMPARISON: None       FINDINGS:   -LUNGS: Minimal bibasilar infiltrate/atelectasis, left greater than right.  Tiny   bilateral pleural effusions are also present.  No definite pulmonary mass.   -MEDIASTINUM/AXILLA: No significant adenopathy.   -HEART/VESSELS: There are multiple bilateral pulmonary emboli.  Mild   atherosclerosis.  Heart size is normal.   -CHEST WALL: Normal.       -LIVER: Normal in size and appearance.     -GALLBLADDER/BILE DUCTS: Gallbladder is collapsed.  No bile duct dilatation.   -PANCREAS: There is a 2.2 cm mass in the body of the pancreas.  Masses   compressing the splenic vein.  Pancreatic duct is dilated.   -SPLEEN: Normal.       -ADRENALS:  Normal.   -KIDNEYS/URETERS: 6.7 cm cyst in the lower pole the left kidney.  No   hydronephrosis or significant mass.   -BLADDER: Normal.   -REPRODUCTIVE ORGANS: No pelvic masses.       -BOWEL: Mild distention of the transverse colon.  No inflammatory changes.   -LYMPH NODES: No significant retroperitoneal, mesenteric, or pelvic adenopathy.   -BONES: No fracture or significant bone lesion.   -VASCULATURE: Normal   -OTHER: There is peritoneal carcinomatosis with associated ascites.  Fluid is   most prominent in the upper left abdomen.                  chest X-ray  I reviewed recent labs, recent radiologic studies, and pertinent records including other doctor notes if needed.    I independently reviewed radiology images for studies I described above or studies I have ordered.   Admission date (for inpatients): 05/15/2019   * No surgery found *  * No surgery found *    ASSESSMENT/PLAN:  Problem List  Date Reviewed: May 12, 2019          Codes Class Noted    Pulmonary emboli [facility]) ICD-10-CM: I26.99  ICD-9-CM: 415.19  05/15/2019        Pancreatic mass ICD-10-CM: K86.89  ICD-9-CM: 577.8  05/15/2019        Leukocytosis ICD-10-CM: D72.829  ICD-9-CM: 288.60  05/15/2019        Polycythemia ICD-10-CM: D75.1  ICD-9-CM: 238.4  05/15/2019        S/P laminectomy ICD-10-CM: J62.831  ICD-9-CM: V45.89  06/11/2018        Spinal stenosis of lumbar region with neurogenic claudication ICD-10-CM: M48.062  ICD-9-CM: 724.03  06/09/2018        Hypertension ICD-10-CM: I10  ICD-9-CM: 401.9  Unknown            Active Problems:    Hypertension ()      Pulmonary emboli ([place]) (05/15/2019)      Pancreatic mass (05/15/2019)      Leukocytosis (05/15/2019)      Polycythemia (05/15/2019)         Plan:  Care management per Hospitalist  --IV Heparin gtt for Bilateral PE  --ECHO pending  GI Following  --Pending EGD.EUS 10-20  Oncology following  ---CEA and Ca 19-9  General Surgery   --Timing of biopsy and port placement per Dr. Melvern Banker, NP    I have seen and examined the patient with the Nurse Practitioner and agree with the above assessment and plan.     Plan port, diagnostic laparoscopy with abdominal PleurX catheter soon.     Thayer Jew, MD

## 2019-05-16 NOTE — Progress Notes (Signed)
Progress Note    Cross Cover:    Patient requesting pain meds.  Oral narcotic pain meds ordered.

## 2019-05-16 NOTE — Progress Notes (Signed)
Hospitalist Note     Admit Date:  05/15/2019  2:14 PM   Name:  Matthew Sherman   Age:  78 y.o.  DOB:  15-Oct-1940   MRN:  [id number]   PCP:  Olga Millers, DO  Treatment Team: Attending Provider: Orion Modest, MD; Consulting Provider: Lavena Bullion, MD; Consulting Provider: Lindalou Hose, MD    HPI/Subjective:     Matthew Sherman is a 78 yo male with PMH of HTN and Lumbar laminectomy who is evaluated with progressive shortness of breath and found with bilateral PE and pancreatitic tumor.   CT Chest/ AP done in the ED and shows primary pancreas tumor, peritoneal carcinomatosis and bilateral PE.  He has been placed on IV heparin. Duplex LE no DVT. ECHO pending.   GI and oncology consulted, pending EGD and EUS with FNA.   Discharge plans pending to home.         05-16-19 has anorexia, no edema, some dyspnea, abd distention      Objective:     Patient Vitals for the past 24 hrs:   Temp Pulse Resp BP SpO2   05/16/19 0830 98.4 ??F (36.9 ??C) 91 20 135/80 96 %   05/16/19 0452 97.5 ??F (36.4 ??C) 88 18 137/88 97 %   05/16/19 0045 97.7 ??F (36.5 ??C) 86 18 126/81 96 %   05/15/19 2023 97.4 ??F (36.3 ??C) 95 18 (!) 166/87 98 %   05/15/19 1828 ??? 93 ??? (!) 145/79 98 %   05/15/19 1758 ??? 99 ??? 127/73 100 %   05/15/19 1401 97.4 ??F (36.3 ??C) 98 18 (!) 151/84 94 %     Oxygen Therapy  O2 Sat (%): 96 % (05/16/19 0830)  Pulse via Oximetry: 93 beats per minute (05/15/19 1828)  O2 Device: Nasal cannula (05/16/19 0730)  O2 Flow Rate (L/min): 2 l/min (05/16/19 0730)    Estimated body mass index is 28.11 kg/m?? as calculated from the following:    Height as of this encounter: 6' (1.829 m).    Weight as of this encounter: 94 kg (207 lb 4.8 oz).      Intake/Output Summary (Last 24 hours) at 05/16/2019 1136  Last data filed at 05/16/2019 0413  Gross per 24 hour   Intake 180 ml   Output 200 ml   Net -20 ml       *Note that automatically entered I/Os may not be accurate; dependent on  patient compliance with collection and accurate data entry by techs.    General:    Well nourished.  Alert.  No distress   CV:   RRR.  No murmur, rub, or gallop. No edema  Lungs:   CTAB.  No wheezing, rhonchi, or rales. anterior  Abdomen:   Distended, decreased BS  Extremities: Warm and dry   Skin:     No rashes or jaundice.   Neuro:  No gross focal deficits    Data Review:  I have reviewed all labs, meds, and studies from the last 24 hours:  Recent Results (from the past 24 hour(s))   EKG, 12 LEAD, INITIAL    Collection Time: 05/15/19  2:05 PM   Result Value Ref Range    Ventricular Rate 97 BPM    Atrial Rate 97 BPM    P-R Interval 228 ms    QRS Duration 140 ms    Q-T Interval 374 ms    QTC Calculation (Bezet) 474 ms    Calculated P Axis 53  degrees    Calculated R Axis -177 degrees    Calculated T Axis -13 degrees    Diagnosis       Sinus rhythm with 1st degree A-V block with Premature atrial complexes  Right bundle branch block  Inferior infarct , age undetermined  Anterolateral infarct , age undetermined  Abnormal ECG  No previous ECGs available  Confirmed by GRABARCZYK  MD ([facility]), MARK A ([id number]) on 05/15/2019 4:06:06 PM     CBC WITH AUTOMATED DIFF    Collection Time: 05/15/19  2:16 PM   Result Value Ref Range    WBC 18.9 (H) 4.3 - 11.1 K/uL    RBC 6.55 (H) 4.23 - 5.6 M/uL    HGB 17.7 (H) 13.6 - 17.2 g/dL    HCT 54.6 (H) 41.1 - 50.3 %    MCV 83.4 79.6 - 97.8 FL    MCH 27.0 26.1 - 32.9 PG    MCHC 32.4 31.4 - 35.0 g/dL    RDW 13.0 11.9 - 14.6 %    PLATELET 269 150 - 450 K/uL    MPV 9.3 (L) 9.4 - 12.3 FL    ABSOLUTE NRBC 0.00 0.0 - 0.2 K/uL    DF AUTOMATED      NEUTROPHILS 82 (H) 43 - 78 %    LYMPHOCYTES 7 (L) 13 - 44 %    MONOCYTES 10 4.0 - 12.0 %    EOSINOPHILS 0 (L) 0.5 - 7.8 %    BASOPHILS 0 0.0 - 2.0 %    IMMATURE GRANULOCYTES 1 0.0 - 5.0 %    ABS. NEUTROPHILS 15.5 (H) 1.7 - 8.2 K/UL    ABS. LYMPHOCYTES 1.3 0.5 - 4.6 K/UL    ABS. MONOCYTES 1.9 (H) 0.1 - 1.3 K/UL    ABS. EOSINOPHILS 0.0 0.0 - 0.8 K/UL     ABS. BASOPHILS 0.1 0.0 - 0.2 K/UL    ABS. IMM. GRANS. 0.2 0.0 - 0.5 K/UL   METABOLIC PANEL, COMPREHENSIVE    Collection Time: 05/15/19  2:16 PM   Result Value Ref Range    Sodium 134 (L) 136 - 145 mmol/L    Potassium 3.8 3.5 - 5.1 mmol/L    Chloride 98 98 - 107 mmol/L    CO2 28 21 - 32 mmol/L    Anion gap 8 7 - 16 mmol/L    Glucose 133 (H) 65 - 100 mg/dL    BUN 11 8 - 23 MG/DL    Creatinine 1.10 0.8 - 1.5 MG/DL    GFR est AA >60 >60 ml/min/1.89m    GFR est non-AA >60 >60 ml/min/1.730m   Calcium 9.3 8.3 - 10.4 MG/DL    Bilirubin, total 0.9 0.2 - 1.1 MG/DL    ALT (SGPT) 97 (H) 12 - 65 U/L    AST (SGOT) 50 (H) 15 - 37 U/L    Alk. phosphatase 105 50 - 136 U/L    Protein, total 7.4 6.3 - 8.2 g/dL    Albumin 2.9 (L) 3.2 - 4.6 g/dL    Globulin 4.5 (H) 2.3 - 3.5 g/dL    A-G Ratio 0.6 (L) 1.2 - 3.5     TROPONIN-HIGH SENSITIVITY    Collection Time: 05/15/19  2:16 PM   Result Value Ref Range    Troponin-High Sensitivity 28.7 (H) 0 - 14 pg/mL   NT-PRO BNP    Collection Time: 05/15/19  2:16 PM   Result Value Ref Range    NT pro-BNP 3,986 (H) <450 PG/ML  LIPASE    Collection Time: 05/15/19  2:16 PM   Result Value Ref Range    Lipase 42 (L) 73 - 393 U/L   SARS-COV-2    Collection Time: 05/15/19  3:27 PM   Result Value Ref Range    Specimen source Nasopharyngeal      COVID-19 rapid test Not detected NOTD      SARS CoV-2 PENDING    CULTURE, BLOOD    Collection Time: 05/15/19  3:27 PM    Specimen: Blood   Result Value Ref Range    Special Requests: NO SPECIAL REQUESTS  LEFT  FOREARM        Culture result: NO GROWTH AFTER 17 HOURS     LACTIC ACID    Collection Time: 05/15/19  3:27 PM   Result Value Ref Range    Lactic acid 2.0 0.4 - 2.0 MMOL/L   CULTURE, BLOOD    Collection Time: 05/15/19  3:28 PM    Specimen: Blood   Result Value Ref Range    Special Requests: NO SPECIAL REQUESTS  LEFT  HAND        Culture result: NO GROWTH AFTER 17 HOURS     TROPONIN-HIGH SENSITIVITY    Collection Time: 05/15/19  4:40 PM    Result Value Ref Range    Troponin-High Sensitivity 26.9 (H) 0 - 14 pg/mL   PTT    Collection Time: 05/15/19  8:48 PM   Result Value Ref Range    aPTT 198.7 (HH) 24.3 - 35.4 SEC   CBC WITH AUTOMATED DIFF    Collection Time: 05/15/19  8:48 PM   Result Value Ref Range    WBC 18.4 (H) 4.3 - 11.1 K/uL    RBC 6.22 (H) 4.23 - 5.6 M/uL    HGB 16.9 13.6 - 17.2 g/dL    HCT 51.2 (H) 41.1 - 50.3 %    MCV 82.3 79.6 - 97.8 FL    MCH 27.2 26.1 - 32.9 PG    MCHC 33.0 31.4 - 35.0 g/dL    RDW 13.0 11.9 - 14.6 %    PLATELET 235 150 - 450 K/uL    MPV 10.2 9.4 - 12.3 FL    ABSOLUTE NRBC 0.00 0.0 - 0.2 K/uL    DF AUTOMATED      NEUTROPHILS 83 (H) 43 - 78 %    LYMPHOCYTES 8 (L) 13 - 44 %    MONOCYTES 8 4.0 - 12.0 %    EOSINOPHILS 0 (L) 0.5 - 7.8 %    BASOPHILS 0 0.0 - 2.0 %    IMMATURE GRANULOCYTES 1 0.0 - 5.0 %    ABS. NEUTROPHILS 15.2 (H) 1.7 - 8.2 K/UL    ABS. LYMPHOCYTES 1.4 0.5 - 4.6 K/UL    ABS. MONOCYTES 1.5 (H) 0.1 - 1.3 K/UL    ABS. EOSINOPHILS 0.0 0.0 - 0.8 K/UL    ABS. BASOPHILS 0.1 0.0 - 0.2 K/UL    ABS. IMM. GRANS. 0.2 0.0 - 0.5 K/UL   CBC WITH AUTOMATED DIFF    Collection Time: 05/16/19  1:04 AM   Result Value Ref Range    WBC 17.7 (H) 4.3 - 11.1 K/uL    RBC 5.68 (H) 4.23 - 5.6 M/uL    HGB 15.6 13.6 - 17.2 g/dL    HCT 46.1 41.1 - 50.3 %    MCV 81.2 79.6 - 97.8 FL    MCH 27.5 26.1 - 32.9 PG    MCHC 33.8 31.4 - 35.0 g/dL  RDW 12.7 11.9 - 14.6 %    PLATELET 217 150 - 450 K/uL    MPV 9.6 9.4 - 12.3 FL    ABSOLUTE NRBC 0.00 0.0 - 0.2 K/uL    DF AUTOMATED      NEUTROPHILS 84 (H) 43 - 78 %    LYMPHOCYTES 6 (L) 13 - 44 %    MONOCYTES 9 4.0 - 12.0 %    EOSINOPHILS 0 (L) 0.5 - 7.8 %    BASOPHILS 0 0.0 - 2.0 %    IMMATURE GRANULOCYTES 1 0.0 - 5.0 %    ABS. NEUTROPHILS 14.9 (H) 1.7 - 8.2 K/UL    ABS. LYMPHOCYTES 1.0 0.5 - 4.6 K/UL    ABS. MONOCYTES 1.6 (H) 0.1 - 1.3 K/UL    ABS. EOSINOPHILS 0.0 0.0 - 0.8 K/UL    ABS. BASOPHILS 0.0 0.0 - 0.2 K/UL    ABS. IMM. GRANS. 0.2 0.0 - 0.5 K/UL   METABOLIC PANEL, BASIC     Collection Time: 05/16/19  1:04 AM   Result Value Ref Range    Sodium 137 136 - 145 mmol/L    Potassium 3.9 3.5 - 5.1 mmol/L    Chloride 103 98 - 107 mmol/L    CO2 25 21 - 32 mmol/L    Anion gap 9 7 - 16 mmol/L    Glucose 133 (H) 65 - 100 mg/dL    BUN 12 8 - 23 MG/DL    Creatinine 0.69 (L) 0.8 - 1.5 MG/DL    GFR est AA >60 >60 ml/min/1.49m    GFR est non-AA >60 >60 ml/min/1.737m   Calcium 8.4 8.3 - 10.4 MG/DL   HEPATIC FUNCTION PANEL    Collection Time: 05/16/19  1:04 AM   Result Value Ref Range    Protein, total 5.6 (L) 6.3 - 8.2 g/dL    Albumin 2.1 (L) 3.2 - 4.6 g/dL    Globulin 3.5 2.3 - 3.5 g/dL    A-G Ratio 0.6 (L) 1.2 - 3.5      Bilirubin, total 0.7 0.2 - 1.1 MG/DL    Bilirubin, direct 0.2 <0.4 MG/DL    Alk. phosphatase 80 50 - 136 U/L    AST (SGOT) 35 15 - 37 U/L    ALT (SGPT) 66 (H) 12 - 65 U/L   HEPARIN XA UFH    Collection Time: 05/16/19  1:04 AM   Result Value Ref Range    Heparin Xa UFH 0.67 0.3 - 0.7 IU/mL   HEPARIN XA UFH    Collection Time: 05/16/19  7:56 AM   Result Value Ref Range    Heparin Xa UFH 0.55 0.3 - 0.7 IU/mL        Current Meds:  Current Facility-Administered Medications   Medication Dose Route Frequency   ??? pantoprazole (PROTONIX) tablet 40 mg  40 mg Oral ACB   ??? calcium carbonate (TUMS) chewable tablet 200 mg [elemental]  200 mg Oral Q6H PRN   ??? heparin 25,000 units in dextrose 500 mL infusion  18-36 Units/kg/hr IntraVENous TITRATE   ??? sodium chloride (NS) flush 5-40 mL  5-40 mL IntraVENous Q8H   ??? sodium chloride (NS) flush 5-40 mL  5-40 mL IntraVENous PRN   ??? acetaminophen (TYLENOL) tablet 650 mg  650 mg Oral Q6H PRN    Or   ??? acetaminophen (TYLENOL) suppository 650 mg  650 mg Rectal Q6H PRN   ??? polyethylene glycol (MIRALAX) packet 17 g  17 g Oral DAILY PRN   ???  ondansetron (ZOFRAN) injection 4 mg  4 mg IntraVENous Q6H PRN   ??? tuberculin injection 5 Units  5 Units IntraDERMal ONCE   ??? 0.9% sodium chloride infusion  75 mL/hr IntraVENous CONTINUOUS       Other Studies:   No results found for this visit on 05/15/19.    Xr Abd Acute W 1 V Chest    Result Date: 05/15/2019  Abdominal Series INDICATION:  Shortness of breath and abdominal pain A single view of the chest and 2 views of the abdomen were obtained. FINDINGS: There are stable mild chronic change in the left lung base.  There are no infiltrates or effusions. The heart size is normal. Flat and upright views of the abdomen show multiple dilated loops of bowel, mostly colon.  There is no free air.  No renal calculi are seen.     IMPRESSION:  1.  Distended bowel, ileus versus distal obstruction. 2.  No acute findings in the chest.     Ct Chest Abd Pelv W Cont    Result Date: 05/15/2019  CT of the Chest, Abdomen, and Pelvis INDICATION: Shortness of breath and abdominal pain Multiple axial images were obtained through the chest, abdomen, and pelvis. Oral contrast was used for bowel opacification.  148m of Isovue 370 intravenous contrast was used for better evaluation of solid organs and vascular structures.  Radiation dose reduction techniques were used for this study.  All CT scans performed at this facility use one or all of the following: Automated exposure control, adjustment of the mA and/or kVp according to patient's size, iterative reconstruction. COMPARISON: None FINDINGS: -LUNGS: Minimal bibasilar infiltrate/atelectasis, left greater than right.  Tiny bilateral pleural effusions are also present.  No definite pulmonary mass. -MEDIASTINUM/AXILLA: No significant adenopathy. -HEART/VESSELS: There are multiple bilateral pulmonary emboli.  Mild atherosclerosis.  Heart size is normal. -CHEST WALL: Normal. -LIVER: Normal in size and appearance.  -GALLBLADDER/BILE DUCTS: Gallbladder is collapsed.  No bile duct dilatation. -PANCREAS: There is a 2.2 cm mass in the body of the pancreas.  Masses compressing the splenic vein.  Pancreatic duct is dilated. -SPLEEN: Normal. -ADRENALS:  Normal. -KIDNEYS/URETERS: 6.7 cm cyst in the  lower pole the left kidney.  No hydronephrosis or significant mass. -BLADDER: Normal. -REPRODUCTIVE ORGANS: No pelvic masses. -BOWEL: Mild distention of the transverse colon.  No inflammatory changes. -LYMPH NODES: No significant retroperitoneal, mesenteric, or pelvic adenopathy. -BONES: No fracture or significant bone lesion. -VASCULATURE: Normal -OTHER: There is peritoneal carcinomatosis with associated ascites.  Fluid is most prominent in the upper left abdomen.      IMPRESSION: 1.  Primary pancreas tumor. 2.  Peritoneal carcinomatosis and ascites. 3.  Multiple bilateral pulmonary emboli. Critical results called to Dr.  DDelora Fuel at 5:45 PM 789 If there are any questions about this report, I can be reached on [employer] or at [phone]     Duplex Lower Ext Venous Bilat    Result Date: 05/15/2019  Bilateral lower extremity venous ultrasound INDICATION:  Pain and swelling, Doppler ultrasound of both lower extremities was performed. FINDINGS:  There is normal flow in the greater saphenous, common femoral, superficial femoral, and popliteal veins. Normal compression and augmentation is demonstrated. The proximal calf veins are also patent.     IMPRESSION: No evidence of deep venous thrombosis in either lower extremity       All Micro Results     Procedure Component Value Units Date/Time    CULTURE, BLOOD [[[id number]]Collected:  05/15/19 1528  Order Status:  Completed Specimen:  Blood Updated:  05/16/19 1028     Special Requests: --        NO SPECIAL REQUESTS  LEFT  HAND       Culture result: NO GROWTH AFTER 17 HOURS       CULTURE, BLOOD [[id number]] Collected:  05/15/19 1527    Order Status:  Completed Specimen:  Blood Updated:  05/16/19 1028     Special Requests: --        NO SPECIAL REQUESTS  LEFT  FOREARM       Culture result: NO GROWTH AFTER 17 HOURS             SARS-CoV-2 Lab Results  "Novel Coronavirus" Test: No results found for: COV2NT   "Emergent Disease" Test: No results found for: EDPR   "SARS-COV-2" Test: No results found for: XGCOVT  Rapid Test:   Lab Results   Component Value Date/Time    COVR Not detected 05/15/2019 03:27 PM            Assessment and Plan:     Hospital Problems as of 05/16/2019 Date Reviewed: 11-May-2019          Codes Class Noted - Resolved POA    Pulmonary emboli ([place]) ICD-10-CM: I26.99  ICD-9-CM: 415.19  05/15/2019 - Present Yes        Pancreatic mass ICD-10-CM: K86.89  ICD-9-CM: 577.8  05/15/2019 - Present Yes        Leukocytosis ICD-10-CM: D72.829  ICD-9-CM: 288.60  05/15/2019 - Present Yes        Polycythemia ICD-10-CM: D75.1  ICD-9-CM: 238.4  05/15/2019 - Present Yes        Hypertension ICD-10-CM: I10  ICD-9-CM: 401.9  Unknown - Present Yes              Plan:    ?? Bilateral PE:  ?? IV heparin drip  ?? ECHO pending  ?? O2 as needed  ??  ??  ?? Pancreatic tumor:  ?? Consulted GI and oncology  ?? Diet as tolerant pending EHD/EUS  ?? Pending EGD.EUS 10-20  ??  ??  ?? HTN:  ?? Holding lisinopril   ??  ??  ?? Polycythemia:  ?? Stable CBC  ??  ??  ?? Leukocytosis:  ?? pending UA  ?? followup CBC as may be stress /cancer reaction  ??  ??  ?? Elevated LFTS:  ?? Repeat lab stable  ??  Discharge planning:  pending  DVT ppx: IV heparin  Code status:  Full  Estimated LOS:  Greater than 2 midnights  Risk:  high  Care plan: daughter 20 [phone]  Signed:  Orion Modest, MD

## 2019-05-16 NOTE — Consults (Signed)
Inpatient Hematology / Oncology Consult Note    Reason for Consult:  Pulmonary emboli [facility]) [I26.99]  Referring Physician:  Orion Modest, MD    History of Present Illness:  Matthew Sherman is a 78 y.o. male admitted on 05/15/2019 with a primary diagnosis of The primary encounter diagnosis was Pancreatic tumor. A diagnosis of Other acute pulmonary embolism without acute cor pulmonale (HCC) was also pertinent to this visit..      Matthew Sherman with PMH of HTN and lumbar laminectomy presented to the ED yesterday with increased SOB.  He reports having abdominal fullness, loose jelly like BMs, anorexia without weight loss.  He recently began having SOB, nausea and vomiting.  He was seen by GI last week for these symptoms and OP EGD/colonoscopy were scheduled, however his increased SOB brought him to the ED.  CT CAP revealed a 2.2cm mass in the body of the pancreas that is compressing the splenic vein, dilated pancreatic duct, peritonneal carcinomatosis, ascites and multiple bilateral PEs.  BLE dopplers were negative for DVT.  He is currently on a heparin drip.  Echo has been ordered.  Labs on arrival with WBC 18.4, ALT 97, AST 50, BC-NGTD.  GI has recommended supportive care for now and can plan for EGD/EUS with FNA either Tuesday or Wednesday.  We were consulted for the appearance of a new pancreatic cancer.          Review of Systems:  Constitutional +appetite changesDenies fever, chills, weight loss, fatigue, night sweats.   HEENT Denies trauma, blurry vision, hearing loss, ear pain, nosebleeds, sore throat, neck pain and ear discharge.    Skin Denies lesions or rashes.   Lungs +dyspnea Denies cough, sputum production or hemoptysis.   Cardiovascular Denies chest pain, palpitations, or lower extremity edema.   Gastrointestinal +nausea, vomiting, abdominal fullness/distension, bowel changes Denies bloody or black stools.   GU Denies dysuria, frequency or hesitancy of urination.    Neuro Denies headaches, visual changes or ataxia. Denies dizziness, tingling, tremors, sensory change, speech change, focal weakness or headaches.     Hematology Denies easy bruising or bleeding, denies gingival bleeding or epistaxis.   Endo Denies heat/cold intolerance, denies diabetes or thyroid abnormalities.   MSK Denies back pain, arthralgias, myalgias or frequent falls.     Psychiatric/Behavioral Denies depression and substance abuse. The patient is not nervous/anxious.         No Known Allergies  Past Medical History:   Diagnosis Date   ??? Hypertension      Past Surgical History:   Procedure Laterality Date   ??? HX APPENDECTOMY  1956   ??? HX CATARACT REMOVAL Bilateral     right done 2018, left 2002   ??? HX LUMBAR LAMINECTOMY  06/09/2018    lami L2-3. L3-4 4-5 Dr. Bradd Canary   ??? HX TONSIL AND ADENOIDECTOMY       Family History   Problem Relation Age of Onset   ??? Heart Attack Father    ??? No Known Problems Sister    ??? No Known Problems Brother    ??? No Known Problems Maternal Grandmother    ??? No Known Problems Maternal Grandfather    ??? No Known Problems Paternal Grandmother    ??? No Known Problems Paternal Grandfather      Social History     Socioeconomic History   ??? Marital status: MARRIED     Spouse name: Not on file   ??? Number of children: Not on file   ???  Years of education: Not on file   ??? Highest education level: Not on file   Occupational History   ??? Not on file   Social Needs   ??? Financial resource strain: Not on file   ??? Food insecurity     Worry: Not on file     Inability: Not on file   ??? Transportation needs     Medical: Not on file     Non-medical: Not on file   Tobacco Use   ??? Smoking status: Never Smoker   ??? Smokeless tobacco: Never Used   Substance and Sexual Activity   ??? Alcohol use: Never     Frequency: Never   ??? Drug use: Never   ??? Sexual activity: Not Currently     Partners: Female   Lifestyle   ??? Physical activity     Days per week: Not on file     Minutes per session: Not on file    ??? Stress: Not on file   Relationships   ??? Social Product manager on phone: Not on file     Gets together: Not on file     Attends religious service: Not on file     Active member of club or organization: Not on file     Attends meetings of clubs or organizations: Not on file     Relationship status: Not on file   ??? Intimate partner violence     Fear of current or ex partner: Not on file     Emotionally abused: Not on file     Physically abused: Not on file     Forced sexual activity: Not on file   Other Topics Concern   ??? Not on file   Social History Narrative   ??? Not on file     Current Facility-Administered Medications   Medication Dose Route Frequency Provider Last Rate Last Dose   ??? pantoprazole (PROTONIX) tablet 40 mg  40 mg Oral ACB Winona Legato, Vipin, MD   40 mg at 05/16/19 0809   ??? calcium carbonate (TUMS) chewable tablet 200 mg [elemental]  200 mg Oral Q6H PRN Winona Legato, Vipin, MD   200 mg at 05/16/19 0511   ??? heparin 25,000 units in dextrose 500 mL infusion  18-36 Units/kg/hr IntraVENous TITRATE Davis-Pachter, Nilsa Nutting, MD 34.3 mL/hr at 05/16/19 0917 18 Units/kg/hr at 05/16/19 0917   ??? sodium chloride (NS) flush 5-40 mL  5-40 mL IntraVENous Q8H Davis-Pachter, Lucy E, MD   10 mL at 05/16/19 0511   ??? sodium chloride (NS) flush 5-40 mL  5-40 mL IntraVENous PRN Davis-Pachter, Nilsa Nutting, MD       ??? acetaminophen (TYLENOL) tablet 650 mg  650 mg Oral Q6H PRN Davis-Pachter, Nilsa Nutting, MD        Or   ??? acetaminophen (TYLENOL) suppository 650 mg  650 mg Rectal Q6H PRN Davis-Pachter, Nilsa Nutting, MD       ??? polyethylene glycol (MIRALAX) packet 17 g  17 g Oral DAILY PRN Davis-Pachter, Nilsa Nutting, MD       ??? ondansetron (ZOFRAN) injection 4 mg  4 mg IntraVENous Q6H PRN Davis-Pachter, Nilsa Nutting, MD   4 mg at 05/16/19 0818   ??? tuberculin injection 5 Units  5 Units IntraDERMal ONCE Davis-Pachter, Nilsa Nutting, MD   5 Units at 05/15/19 2032   ??? 0.9% sodium chloride infusion  75 mL/hr IntraVENous CONTINUOUS  Davis-Pachter, Nilsa Nutting, MD 75 mL/hr at 05/16/19 0916 75 mL/hr  at 05/16/19 0916       OBJECTIVE:  Patient Vitals for the past 8 hrs:   BP Temp Pulse Resp SpO2 Weight   05/16/19 0830 135/80 98.4 ??F (36.9 ??C) 91 20 96 % ???   05/16/19 0452 137/88 97.5 ??F (36.4 ??C) 88 18 97 % 207 lb 4.8 oz (94 kg)     Temp (24hrs), Avg:97.7 ??F (36.5 ??C), Min:97.4 ??F (36.3 ??C), Max:98.4 ??F (36.9 ??C)    No intake/output data recorded.    Physical Exam:  Constitutional: Well developed, well nourished male in no acute distress, sitting comfortably in the hospital bed.    HEENT: Normocephalic and atraumatic. Oropharynx is clear, mucous membranes are moist.  Sclerae anicteric. Neck supple without JVD. No thyromegaly present.    Lymph node   No palpable submandibular, cervical, supraclavicular, axillary or inguinal lymph nodes.   Skin Warm and dry.  No bruising and no rash noted.  No erythema.  No pallor.    Respiratory Lungs are clear to auscultation bilaterally without wheezes, rales or rhonchi, normal air exchange without accessory muscle use.    CVS Normal rate, regular rhythm and normal S1 and S2.  No murmurs, gallops, or rubs.   Abdomen +distended Soft, nontender and, normoactive bowel sounds.  No palpable mass.  No hepatosplenomegaly.   Neuro Grossly nonfocal with no obvious sensory or motor deficits.   MSK Normal range of motion in general.  No edema and no tenderness.   Psych Appropriate mood and affect.        Labs:    Recent Results (from the past 24 hour(s))   EKG, 12 LEAD, INITIAL    Collection Time: 05/15/19  2:05 PM   Result Value Ref Range    Ventricular Rate 97 BPM    Atrial Rate 97 BPM    P-R Interval 228 ms    QRS Duration 140 ms    Q-T Interval 374 ms    QTC Calculation (Bezet) 474 ms    Calculated P Axis 53 degrees    Calculated R Axis -177 degrees    Calculated T Axis -13 degrees    Diagnosis       Sinus rhythm with 1st degree A-V block with Premature atrial complexes  Right bundle branch block   Inferior infarct , age undetermined  Anterolateral infarct , age undetermined  Abnormal ECG  No previous ECGs available  Confirmed by GRABARCZYK  MD (UC), MARK A ([id number]) on 05/15/2019 4:06:06 PM     CBC WITH AUTOMATED DIFF    Collection Time: 05/15/19  2:16 PM   Result Value Ref Range    WBC 18.9 (H) 4.3 - 11.1 K/uL    RBC 6.55 (H) 4.23 - 5.6 M/uL    HGB 17.7 (H) 13.6 - 17.2 g/dL    HCT 54.6 (H) 41.1 - 50.3 %    MCV 83.4 79.6 - 97.8 FL    MCH 27.0 26.1 - 32.9 PG    MCHC 32.4 31.4 - 35.0 g/dL    RDW 13.0 11.9 - 14.6 %    PLATELET 269 150 - 450 K/uL    MPV 9.3 (L) 9.4 - 12.3 FL    ABSOLUTE NRBC 0.00 0.0 - 0.2 K/uL    DF AUTOMATED      NEUTROPHILS 82 (H) 43 - 78 %    LYMPHOCYTES 7 (L) 13 - 44 %    MONOCYTES 10 4.0 - 12.0 %    EOSINOPHILS 0 (L) 0.5 - 7.8 %  BASOPHILS 0 0.0 - 2.0 %    IMMATURE GRANULOCYTES 1 0.0 - 5.0 %    ABS. NEUTROPHILS 15.5 (H) 1.7 - 8.2 K/UL    ABS. LYMPHOCYTES 1.3 0.5 - 4.6 K/UL    ABS. MONOCYTES 1.9 (H) 0.1 - 1.3 K/UL    ABS. EOSINOPHILS 0.0 0.0 - 0.8 K/UL    ABS. BASOPHILS 0.1 0.0 - 0.2 K/UL    ABS. IMM. GRANS. 0.2 0.0 - 0.5 K/UL   METABOLIC PANEL, COMPREHENSIVE    Collection Time: 05/15/19  2:16 PM   Result Value Ref Range    Sodium 134 (L) 136 - 145 mmol/L    Potassium 3.8 3.5 - 5.1 mmol/L    Chloride 98 98 - 107 mmol/L    CO2 28 21 - 32 mmol/L    Anion gap 8 7 - 16 mmol/L    Glucose 133 (H) 65 - 100 mg/dL    BUN 11 8 - 23 MG/DL    Creatinine 1.10 0.8 - 1.5 MG/DL    GFR est AA >60 >60 ml/min/1.56m    GFR est non-AA >60 >60 ml/min/1.715m   Calcium 9.3 8.3 - 10.4 MG/DL    Bilirubin, total 0.9 0.2 - 1.1 MG/DL    ALT (SGPT) 97 (H) 12 - 65 U/L    AST (SGOT) 50 (H) 15 - 37 U/L    Alk. phosphatase 105 50 - 136 U/L    Protein, total 7.4 6.3 - 8.2 g/dL    Albumin 2.9 (L) 3.2 - 4.6 g/dL    Globulin 4.5 (H) 2.3 - 3.5 g/dL    A-G Ratio 0.6 (L) 1.2 - 3.5     TROPONIN-HIGH SENSITIVITY    Collection Time: 05/15/19  2:16 PM   Result Value Ref Range    Troponin-High Sensitivity 28.7 (H) 0 - 14 pg/mL    NT-PRO BNP    Collection Time: 05/15/19  2:16 PM   Result Value Ref Range    NT pro-BNP 3,986 (H) <450 PG/ML   LIPASE    Collection Time: 05/15/19  2:16 PM   Result Value Ref Range    Lipase 42 (L) 73 - 393 U/L   SARS-COV-2    Collection Time: 05/15/19  3:27 PM   Result Value Ref Range    Specimen source Nasopharyngeal      COVID-19 rapid test Not detected NOTD      SARS CoV-2 PENDING    CULTURE, BLOOD    Collection Time: 05/15/19  3:27 PM    Specimen: Blood   Result Value Ref Range    Special Requests: NO SPECIAL REQUESTS  LEFT  FOREARM        Culture result: NO GROWTH AFTER 17 HOURS     LACTIC ACID    Collection Time: 05/15/19  3:27 PM   Result Value Ref Range    Lactic acid 2.0 0.4 - 2.0 MMOL/L   CULTURE, BLOOD    Collection Time: 05/15/19  3:28 PM    Specimen: Blood   Result Value Ref Range    Special Requests: NO SPECIAL REQUESTS  LEFT  HAND        Culture result: NO GROWTH AFTER 17 HOURS     TROPONIN-HIGH SENSITIVITY    Collection Time: 05/15/19  4:40 PM   Result Value Ref Range    Troponin-High Sensitivity 26.9 (H) 0 - 14 pg/mL   PTT    Collection Time: 05/15/19  8:48 PM   Result Value Ref Range  aPTT 198.7 (HH) 24.3 - 35.4 SEC   CBC WITH AUTOMATED DIFF    Collection Time: 05/15/19  8:48 PM   Result Value Ref Range    WBC 18.4 (H) 4.3 - 11.1 K/uL    RBC 6.22 (H) 4.23 - 5.6 M/uL    HGB 16.9 13.6 - 17.2 g/dL    HCT 51.2 (H) 41.1 - 50.3 %    MCV 82.3 79.6 - 97.8 FL    MCH 27.2 26.1 - 32.9 PG    MCHC 33.0 31.4 - 35.0 g/dL    RDW 13.0 11.9 - 14.6 %    PLATELET 235 150 - 450 K/uL    MPV 10.2 9.4 - 12.3 FL    ABSOLUTE NRBC 0.00 0.0 - 0.2 K/uL    DF AUTOMATED      NEUTROPHILS 83 (H) 43 - 78 %    LYMPHOCYTES 8 (L) 13 - 44 %    MONOCYTES 8 4.0 - 12.0 %    EOSINOPHILS 0 (L) 0.5 - 7.8 %    BASOPHILS 0 0.0 - 2.0 %    IMMATURE GRANULOCYTES 1 0.0 - 5.0 %    ABS. NEUTROPHILS 15.2 (H) 1.7 - 8.2 K/UL    ABS. LYMPHOCYTES 1.4 0.5 - 4.6 K/UL    ABS. MONOCYTES 1.5 (H) 0.1 - 1.3 K/UL    ABS. EOSINOPHILS 0.0 0.0 - 0.8 K/UL     ABS. BASOPHILS 0.1 0.0 - 0.2 K/UL    ABS. IMM. GRANS. 0.2 0.0 - 0.5 K/UL   CBC WITH AUTOMATED DIFF    Collection Time: 05/16/19  1:04 AM   Result Value Ref Range    WBC 17.7 (H) 4.3 - 11.1 K/uL    RBC 5.68 (H) 4.23 - 5.6 M/uL    HGB 15.6 13.6 - 17.2 g/dL    HCT 46.1 41.1 - 50.3 %    MCV 81.2 79.6 - 97.8 FL    MCH 27.5 26.1 - 32.9 PG    MCHC 33.8 31.4 - 35.0 g/dL    RDW 12.7 11.9 - 14.6 %    PLATELET 217 150 - 450 K/uL    MPV 9.6 9.4 - 12.3 FL    ABSOLUTE NRBC 0.00 0.0 - 0.2 K/uL    DF AUTOMATED      NEUTROPHILS 84 (H) 43 - 78 %    LYMPHOCYTES 6 (L) 13 - 44 %    MONOCYTES 9 4.0 - 12.0 %    EOSINOPHILS 0 (L) 0.5 - 7.8 %    BASOPHILS 0 0.0 - 2.0 %    IMMATURE GRANULOCYTES 1 0.0 - 5.0 %    ABS. NEUTROPHILS 14.9 (H) 1.7 - 8.2 K/UL    ABS. LYMPHOCYTES 1.0 0.5 - 4.6 K/UL    ABS. MONOCYTES 1.6 (H) 0.1 - 1.3 K/UL    ABS. EOSINOPHILS 0.0 0.0 - 0.8 K/UL    ABS. BASOPHILS 0.0 0.0 - 0.2 K/UL    ABS. IMM. GRANS. 0.2 0.0 - 0.5 K/UL   METABOLIC PANEL, BASIC    Collection Time: 05/16/19  1:04 AM   Result Value Ref Range    Sodium 137 136 - 145 mmol/L    Potassium 3.9 3.5 - 5.1 mmol/L    Chloride 103 98 - 107 mmol/L    CO2 25 21 - 32 mmol/L    Anion gap 9 7 - 16 mmol/L    Glucose 133 (H) 65 - 100 mg/dL    BUN 12 8 - 23 MG/DL    Creatinine  0.69 (L) 0.8 - 1.5 MG/DL    GFR est AA >60 >60 ml/min/1.18m    GFR est non-AA >60 >60 ml/min/1.762m   Calcium 8.4 8.3 - 10.4 MG/DL   HEPATIC FUNCTION PANEL    Collection Time: 05/16/19  1:04 AM   Result Value Ref Range    Protein, total 5.6 (L) 6.3 - 8.2 g/dL    Albumin 2.1 (L) 3.2 - 4.6 g/dL    Globulin 3.5 2.3 - 3.5 g/dL    A-G Ratio 0.6 (L) 1.2 - 3.5      Bilirubin, total 0.7 0.2 - 1.1 MG/DL    Bilirubin, direct 0.2 <0.4 MG/DL    Alk. phosphatase 80 50 - 136 U/L    AST (SGOT) 35 15 - 37 U/L    ALT (SGPT) 66 (H) 12 - 65 U/L   HEPARIN XA UFH    Collection Time: 05/16/19  1:04 AM   Result Value Ref Range    Heparin Xa UFH 0.67 0.3 - 0.7 IU/mL   HEPARIN XA UFH     Collection Time: 05/16/19  7:56 AM   Result Value Ref Range    Heparin Xa UFH 0.55 0.3 - 0.7 IU/mL       Imaging:  CT of the Chest, Abdomen, and Pelvis  ??  INDICATION: Shortness of breath and abdominal pain  ??  Multiple axial images were obtained through the chest, abdomen, and pelvis.   Oral contrast was used for bowel opacification.  10063mf Isovue 370 intravenous  contrast was used for better evaluation of solid organs and vascular structures.   Radiation dose reduction techniques were used for this study.  All CT scans  performed at this facility use one or all of the following: Automated exposure  control, adjustment of the mA and/or kVp according to patient's size, iterative  reconstruction.  ??  COMPARISON: None  ??  FINDINGS:  -LUNGS: Minimal bibasilar infiltrate/atelectasis, left greater than right.  Tiny  bilateral pleural effusions are also present.  No definite pulmonary mass.  -MEDIASTINUM/AXILLA: No significant adenopathy.  -HEART/VESSELS: There are multiple bilateral pulmonary emboli.  Mild  atherosclerosis.  Heart size is normal.  -CHEST WALL: Normal.  ??  -LIVER: Normal in size and appearance.    -GALLBLADDER/BILE DUCTS: Gallbladder is collapsed.  No bile duct dilatation.  -PANCREAS: There is a 2.2 cm mass in the body of the pancreas.  Masses  compressing the splenic vein.  Pancreatic duct is dilated.  -SPLEEN: Normal.  ??  -ADRENALS:  Normal.  -KIDNEYS/URETERS: 6.7 cm cyst in the lower pole the left kidney.  No  hydronephrosis or significant mass.  -BLADDER: Normal.  -REPRODUCTIVE ORGANS: No pelvic masses.  ??  -BOWEL: Mild distention of the transverse colon.  No inflammatory changes.  -LYMPH NODES: No significant retroperitoneal, mesenteric, or pelvic adenopathy.  -BONES: No fracture or significant bone lesion.  -VASCULATURE: Normal  -OTHER: There is peritoneal carcinomatosis with associated ascites.  Fluid is  most prominent in the upper left abdomen.    ??  IMPRESSION  IMPRESSION:    1.  Primary pancreas tumor.  2.  Peritoneal carcinomatosis and ascites.  3.  Multiple bilateral pulmonary emboli.  ??    ASSESSMENT:  Problem List  Date Reviewed: 9/310-14-2020       Codes Class Noted    Pulmonary emboli ([facility] Medical[facility]-10-CM: I26.99  ICD-9-CM: 415.19  05/15/2019        Pancreatic mass ICD-10-CM: K86E45.40CD-9-CM: 577.8  05/15/2019  Leukocytosis ICD-10-CM: G64.403  ICD-9-CM: 288.60  05/15/2019        Polycythemia ICD-10-CM: D75.1  ICD-9-CM: 238.4  05/15/2019        S/P laminectomy ICD-10-CM: K74.259  ICD-9-CM: V45.89  06/11/2018        Spinal stenosis of lumbar region with neurogenic claudication ICD-10-CM: M48.062  ICD-9-CM: 724.03  06/09/2018        Hypertension ICD-10-CM: I10  ICD-9-CM: 401.9  Unknown            78 y.o. M H/O laminectomy developed abd distension, N/V/lose stools, then SOB and admitted from ER, CT showed panc body tumor, massive ascites, peritoneal disease, I reviewed CT personally and discussed with pt and and daughter this wold be most suspicious of pancreatic cancer with peritoneal metastasis, needs histology diagnosis, need therapeutic paracentesis, possible need for ascites drain till responding to chemo and most likely need port, large B/L PE on [facility], discussed with Dr. Sheria Lang, will follow.    RECOMMENDATIONS:  Pancreatic Mass  -check CEA and Ca 19-9  -will need biopsy  -consulted Dr Sheria Lang for biopsy, staging, and port placement     B/L PEs  -Hep gtt  -BLE doppler negative for DVT    Leukocytosis  -WBC 17.7, down from 18.4 yesterday  -will monitor    Elevated LFTs  -Bili normal 0.2  -ALT 66, down from 97  -AST 35, down from 50  -monitor     Supportive care per primary team     Lab studies and imaging studies (CT) were personally reviewed.    Thank you for allowing [place] to participate in the care of Matthew. [place].        Lindalou Hose, MD  [facility] [place] [address]  Office : [phone]  Fax : [phone]

## 2019-05-16 NOTE — Progress Notes (Signed)
Problem: Falls - Risk of  Goal: *Absence of Falls  Description: Document Matthew Sherman Fall Risk and appropriate interventions in the flowsheet.  Outcome: Progressing Towards Goal  Note: Fall Risk Interventions:  Mobility Interventions: Patient to call before getting OOB, PT Consult for mobility concerns, Communicate number of staff needed for ambulation/transfer         Medication Interventions: Patient to call before getting OOB, Teach patient to arise slowly, Evaluate medications/consider consulting pharmacy         History of Falls Interventions: Vital signs minimum Q4HRs X 24 hrs (comment for end date), Investigate reason for fall, Room close to nurse's station

## 2019-05-16 NOTE — Other (Signed)
Bedside and Verbal shift change report given to [place], Therapist, sports (Soil scientist) by self Engineer, drilling). Report included the following information SBAR, Kardex, Intake/Output, MAR, and Recent Results.      Heparin gtt verified per MAR.

## 2019-05-16 NOTE — Consults (Addendum)
[facility] Consult Note       Consulted GI Physician: Dr. Netta Cedars  GI MD: Dr. Augustin Coupe    Referring Provider:  Dr. Jacqlyn Krauss    Consult Date:  05/16/2019    Admit Date:  05/15/2019    Chief Complaint: pancreatic tumor    Subjective:     History of Present Illness:  Patient is a 78 y.o. male with PMH of HTN and lumbar laminectomy, who is seen in consultation at the request of Dr. Laureen Ochs for pancreatic mass on CT. He was admitted 10/17 for progressive SOB without Chest pain and CT showed 2.2cm pancreatic tumor with peritoneal carcinomatosis and bilateral PE. He is on Heparin drip. Labs reveal leukocytosis and mild ALT elevation but are otherwise normal. PTT elevated as expeted on Heparin. He states that he has had abdominal distention with pain and anorexia off and on for about one year but this has become progressively worse. He endorses nausea without vomiting and denies wt loss. He did vomit a small amt this morning prior to starting breakfast with mucus and a few possible tinges of blood. He has also had a change in bowel habits with loose jelly like stools intermittently without report of significant diarrhea, constipation, hematochezia or melena.  He continues to have abdominal distention and firmness but no significant pain. He was seen in [place] last week by GI for these symptoms and was scheduled for EGD/colonoscopy.       PMH:  Past Medical History:   Diagnosis Date   ??? Hypertension        PSH:  Past Surgical History:   Procedure Laterality Date   ??? HX APPENDECTOMY  1956   ??? HX CATARACT REMOVAL Bilateral     right done 2018, left 2002   ??? HX LUMBAR LAMINECTOMY  06/09/2018    lami L2-3. L3-4 4-5 Dr. Bradd Canary   ??? HX TONSIL AND ADENOIDECTOMY         Allergies:  No Known Allergies    Home Medications:  Prior to Admission medications    Medication Sig Start Date End Date Taking? Authorizing Provider   lisinopriL (PRINIVIL, ZESTRIL) 10 mg tablet Take 1 Tab by mouth daily.  04/28/19   Sueanne Margarita, [facility] Medications:  Current Facility-Administered Medications   Medication Dose Route Frequency   ??? pantoprazole (PROTONIX) tablet 40 mg  40 mg Oral ACB   ??? calcium carbonate (TUMS) chewable tablet 200 mg [elemental]  200 mg Oral Q6H PRN   ??? heparin 25,000 units in dextrose 500 mL infusion  18-36 Units/kg/hr IntraVENous TITRATE   ??? sodium chloride (NS) flush 5-40 mL  5-40 mL IntraVENous Q8H   ??? sodium chloride (NS) flush 5-40 mL  5-40 mL IntraVENous PRN   ??? acetaminophen (TYLENOL) tablet 650 mg  650 mg Oral Q6H PRN    Or   ??? acetaminophen (TYLENOL) suppository 650 mg  650 mg Rectal Q6H PRN   ??? polyethylene glycol (MIRALAX) packet 17 g  17 g Oral DAILY PRN   ??? ondansetron (ZOFRAN) injection 4 mg  4 mg IntraVENous Q6H PRN   ??? tuberculin injection 5 Units  5 Units IntraDERMal ONCE   ??? 0.9% sodium chloride infusion  75 mL/hr IntraVENous CONTINUOUS       Social History:  Social History     Tobacco Use   ??? Smoking status: Never Smoker   ??? Smokeless tobacco: Never Used   Substance Use Topics   ???  Alcohol use: Never     Frequency: Never       Pt denies any history of drug use, blood transfusions, or tattoos.    Family History:  Family History   Problem Relation Age of Onset   ??? Heart Attack Father    ??? No Known Problems Sister    ??? No Known Problems Brother    ??? No Known Problems Maternal Grandmother    ??? No Known Problems Maternal Grandfather    ??? No Known Problems Paternal Grandmother    ??? No Known Problems Paternal Grandfather        Review of Systems:  A detailed 10 system ROS is obtained, with pertinent positives as listed above.  All others are negative.    Diet:  Full liquid    Objective:     Physical Exam:  Vitals:  Visit Vitals  BP 137/88 (BP 1 Location: Left arm, BP Patient Position: At rest)   Pulse 88   Temp 97.5 ??F (36.4 ??C)   Resp 18   Ht 6' (1.829 m)   Wt 94 kg (207 lb 4.8 oz)   SpO2 97%   BMI 28.11 kg/m??     Gen:  Pt is alert, cooperative, no acute distress   Skin:  Extremities and face reveal no rashes.   HEENT: Sclerae anicteric.  Extra-occular muscles are intact.  No oral ulcers.  No abnormal pigmentation of the lips.  The neck is supple.  Cardiovascular: Regular rate and rhythm. No murmurs, gallops, or rubs.  Respiratory:  Comfortable breathing with no accessory muscle use. Clear breath sounds anteriorly with no wheezes, rales, or rhonchi.  GI:  Abdomen nondistended, soft, and nontender.  Normal active bowel sounds. No enlargement of the liver or spleen. No masses palpable.  Rectal:  Deferred  Musculoskeletal:  No pitting edema of the lower legs.    Neurological:  Gross memory appears intact.  Patient is alert and oriented.  Psychiatric:  Mood appears appropriate with judgement intact.  Lymphatic:  No cervical or supraclavicular adenopathy.    Laboratory:    Recent Labs     05/16/19  0104 05/15/19  2048 05/15/19  1416   WBC 17.7* 18.4* 18.9*   HGB 15.6 16.9 17.7*   HCT 46.1 51.2* 54.6*   PLT 217 235 269   MCV 81.2 82.3 83.4   NA 137  --  134*   K 3.9  --  3.8   CL 103  --  98   CO2 25  --  28   BUN 12  --  11   CREA 0.69*  --  1.10   CA 8.4  --  9.3   GLU 133*  --  133*   AP 80  --  105   ALT 66*  --  97*   TBILI 0.7  --  0.9   CBIL 0.2  --   --    ALB 2.1*  --  2.9*   TP 5.6*  --  7.4   LPSE  --   --  42*   APTT  --  198.7*  --       Xr Abd Acute W 1 V Chest  ??  Result Date: 05/15/2019  Abdominal Series INDICATION:  Shortness of breath and abdominal pain A single view of the chest and 2 views of the abdomen were obtained. FINDINGS: There are stable mild chronic change in the left lung base.  There are no infiltrates or effusions. The  heart size is normal. Flat and upright views of the abdomen show multiple dilated loops of bowel, mostly colon.  There is no free air.  No renal calculi are seen.   ??  IMPRESSION:  1.  Distended bowel, ileus versus distal obstruction. 2.  No acute findings in the chest.   ??  Ct Chest Abd Pelv W Cont  ??  Result Date: 05/15/2019   CT of the Chest, Abdomen, and Pelvis INDICATION: Shortness of breath and abdominal pain Multiple axial images were obtained through the chest, abdomen, and pelvis. Oral contrast was used for bowel opacification.  153mL of Isovue 370 intravenous contrast was used for better evaluation of solid organs and vascular structures.  Radiation dose reduction techniques were used for this study.  All CT scans performed at this facility use one or all of the following: Automated exposure control, adjustment of the mA and/or kVp according to patient's size, iterative reconstruction. COMPARISON: None FINDINGS: -LUNGS: Minimal bibasilar infiltrate/atelectasis, left greater than right.  Tiny bilateral pleural effusions are also present.  No definite pulmonary mass. -MEDIASTINUM/AXILLA: No significant adenopathy. -HEART/VESSELS: There are multiple bilateral pulmonary emboli.  Mild atherosclerosis.  Heart size is normal. -CHEST WALL: Normal. -LIVER: Normal in size and appearance.  -GALLBLADDER/BILE DUCTS: Gallbladder is collapsed.  No bile duct dilatation. -PANCREAS: There is a 2.2 cm mass in the body of the pancreas.  Masses compressing the splenic vein.  Pancreatic duct is dilated. -SPLEEN: Normal. -ADRENALS:  Normal. -KIDNEYS/URETERS: 6.7 cm cyst in the lower pole the left kidney.  No hydronephrosis or significant mass. -BLADDER: Normal. -REPRODUCTIVE ORGANS: No pelvic masses. -BOWEL: Mild distention of the transverse colon.  No inflammatory changes. -LYMPH NODES: No significant retroperitoneal, mesenteric, or pelvic adenopathy. -BONES: No fracture or significant bone lesion. -VASCULATURE: Normal -OTHER: There is peritoneal carcinomatosis with associated ascites.  Fluid is most prominent in the upper left abdomen.    ??  IMPRESSION: 1.  Primary pancreas tumor. 2.  Peritoneal carcinomatosis and ascites. 3.  Multiple bilateral pulmonary emboli. Critical results called  to Dr.  Delora Fuel  at 5:45 PM 789 If there are any questions about this report, I can be reached on [employer] or at [phone]     Assessment:     Active Problems:    Hypertension ()      Pulmonary emboli ([place]) (05/15/2019)      Pancreatic mass (05/15/2019)      Leukocytosis (05/15/2019)      Polycythemia (05/15/2019)    78 y.o. male with PMH of HTN and lumbar laminectomy, who is seen in consultation at the request of Dr. Laureen Ochs for one year hx of abdominal distention, anorexia, change in bowel habits, and pancreatic mass on CT. He was admitted 10/17 for progressive SOB without Chest pain and CT showed 2.2cm pancreatic body  tumor with peritoneal carcinomatosis and bilateral PE. He is on Heparin drip. Labs reveal leukocytosis and mild ALT elevation but are otherwise normal. PTT elevated as expeted on Heparin. Will need tissue biopsy via EUS.     Plan:     - will need EGD/EUS with FNA by either Dr. Mare Ferrari Tuesday or Dr. Tito Dine Wednesday   - will need to hold Heparin prior   - will also need oncology consult  - supportive care until this time.     Of note: he has EGD/ colo scheduled 10/28 with Dr. Augustin Coupe- Dr. Augustin Coupe made aware of current admission.     [place], [place]   ATTENDING NOTE:  I have seen and examined the patient along with my NP/PA. The assessment and plan above is my own.     We will elevate his case to the highest priority for our EUS physicians.  Will know by tomorrow when we can accomplish this.    Lavena Bullion, MD          Patient is seen and examined in collaboration with Dr. Yetta Flock.  Assessment and plan as per Dr. Yetta Flock.Marland Kitchen

## 2019-05-16 NOTE — H&P (View-Only) (Signed)
[facility] Consult Note       Consulted GI Physician: Dr. Netta Cedars  GI MD: Dr. Augustin Coupe    Referring Provider:  Dr. Jacqlyn Krauss    Consult Date:  05/16/2019    Admit Date:  05/15/2019    Chief Complaint: pancreatic tumor    Subjective:     History of Present Illness:  Patient is a 78 y.o. male with PMH of HTN and lumbar laminectomy, who is seen in consultation at the request of Dr. Laureen Ochs for pancreatic mass on CT. He was admitted 10/17 for progressive SOB without Chest pain and CT showed 2.2cm pancreatic tumor with peritoneal carcinomatosis and bilateral PE. He is on Heparin drip. Labs reveal leukocytosis and mild ALT elevation but are otherwise normal. PTT elevated as expeted on Heparin. He states that he has had abdominal distention with pain and anorexia off and on for about one year but this has become progressively worse. He endorses nausea without vomiting and denies wt loss. He did vomit a small amt this morning prior to starting breakfast with mucus and a few possible tinges of blood. He has also had a change in bowel habits with loose jelly like stools intermittently without report of significant diarrhea, constipation, hematochezia or melena.  He continues to have abdominal distention and firmness but no significant pain. He was seen in [place] last week by GI for these symptoms and was scheduled for EGD/colonoscopy.       PMH:  Past Medical History:   Diagnosis Date   ??? Hypertension        PSH:  Past Surgical History:   Procedure Laterality Date   ??? HX APPENDECTOMY  1956   ??? HX CATARACT REMOVAL Bilateral     right done 2018, left 2002   ??? HX LUMBAR LAMINECTOMY  06/09/2018    lami L2-3. L3-4 4-5 Dr. Bradd Canary   ??? HX TONSIL AND ADENOIDECTOMY         Allergies:  No Known Allergies    Home Medications:  Prior to Admission medications    Medication Sig Start Date End Date Taking? Authorizing Provider   lisinopriL (PRINIVIL, ZESTRIL) 10 mg tablet Take 1 Tab by mouth daily.  04/28/19   Sueanne Margarita, [facility] Medications:  Current Facility-Administered Medications   Medication Dose Route Frequency   ??? pantoprazole (PROTONIX) tablet 40 mg  40 mg Oral ACB   ??? calcium carbonate (TUMS) chewable tablet 200 mg [elemental]  200 mg Oral Q6H PRN   ??? heparin 25,000 units in dextrose 500 mL infusion  18-36 Units/kg/hr IntraVENous TITRATE   ??? sodium chloride (NS) flush 5-40 mL  5-40 mL IntraVENous Q8H   ??? sodium chloride (NS) flush 5-40 mL  5-40 mL IntraVENous PRN   ??? acetaminophen (TYLENOL) tablet 650 mg  650 mg Oral Q6H PRN    Or   ??? acetaminophen (TYLENOL) suppository 650 mg  650 mg Rectal Q6H PRN   ??? polyethylene glycol (MIRALAX) packet 17 g  17 g Oral DAILY PRN   ??? ondansetron (ZOFRAN) injection 4 mg  4 mg IntraVENous Q6H PRN   ??? tuberculin injection 5 Units  5 Units IntraDERMal ONCE   ??? 0.9% sodium chloride infusion  75 mL/hr IntraVENous CONTINUOUS       Social History:  Social History     Tobacco Use   ??? Smoking status: Never Smoker   ??? Smokeless tobacco: Never Used   Substance Use Topics   ???  Alcohol use: Never     Frequency: Never       Pt denies any history of drug use, blood transfusions, or tattoos.    Family History:  Family History   Problem Relation Age of Onset   ??? Heart Attack Father    ??? No Known Problems Sister    ??? No Known Problems Brother    ??? No Known Problems Maternal Grandmother    ??? No Known Problems Maternal Grandfather    ??? No Known Problems Paternal Grandmother    ??? No Known Problems Paternal Grandfather        Review of Systems:  A detailed 10 system ROS is obtained, with pertinent positives as listed above.  All others are negative.    Diet:  Full liquid    Objective:     Physical Exam:  Vitals:  Visit Vitals  BP 137/88 (BP 1 Location: Left arm, BP Patient Position: At rest)   Pulse 88   Temp 97.5 ??F (36.4 ??C)   Resp 18   Ht 6' (1.829 m)   Wt 94 kg (207 lb 4.8 oz)   SpO2 97%   BMI 28.11 kg/m??     Gen:  Pt is alert, cooperative, no acute distress   Skin:  Extremities and face reveal no rashes.   HEENT: Sclerae anicteric.  Extra-occular muscles are intact.  No oral ulcers.  No abnormal pigmentation of the lips.  The neck is supple.  Cardiovascular: Regular rate and rhythm. No murmurs, gallops, or rubs.  Respiratory:  Comfortable breathing with no accessory muscle use. Clear breath sounds anteriorly with no wheezes, rales, or rhonchi.  GI:  Abdomen nondistended, soft, and nontender.  Normal active bowel sounds. No enlargement of the liver or spleen. No masses palpable.  Rectal:  Deferred  Musculoskeletal:  No pitting edema of the lower legs.    Neurological:  Gross memory appears intact.  Patient is alert and oriented.  Psychiatric:  Mood appears appropriate with judgement intact.  Lymphatic:  No cervical or supraclavicular adenopathy.    Laboratory:    Recent Labs     05/16/19  0104 05/15/19  2048 05/15/19  1416   WBC 17.7* 18.4* 18.9*   HGB 15.6 16.9 17.7*   HCT 46.1 51.2* 54.6*   PLT 217 235 269   MCV 81.2 82.3 83.4   NA 137  --  134*   K 3.9  --  3.8   CL 103  --  98   CO2 25  --  28   BUN 12  --  11   CREA 0.69*  --  1.10   CA 8.4  --  9.3   GLU 133*  --  133*   AP 80  --  105   ALT 66*  --  97*   TBILI 0.7  --  0.9   CBIL 0.2  --   --    ALB 2.1*  --  2.9*   TP 5.6*  --  7.4   LPSE  --   --  42*   APTT  --  198.7*  --       Xr Abd Acute W 1 V Chest  ??  Result Date: 05/15/2019  Abdominal Series INDICATION:  Shortness of breath and abdominal pain A single view of the chest and 2 views of the abdomen were obtained. FINDINGS: There are stable mild chronic change in the left lung base.  There are no infiltrates or effusions. The  heart size is normal. Flat and upright views of the abdomen show multiple dilated loops of bowel, mostly colon.  There is no free air.  No renal calculi are seen.   ??  IMPRESSION:  1.  Distended bowel, ileus versus distal obstruction. 2.  No acute findings in the chest.   ??  Ct Chest Abd Pelv W Cont  ??  Result Date: 05/15/2019   CT of the Chest, Abdomen, and Pelvis INDICATION: Shortness of breath and abdominal pain Multiple axial images were obtained through the chest, abdomen, and pelvis. Oral contrast was used for bowel opacification.  161mL of Isovue 370 intravenous contrast was used for better evaluation of solid organs and vascular structures.  Radiation dose reduction techniques were used for this study.  All CT scans performed at this facility use one or all of the following: Automated exposure control, adjustment of the mA and/or kVp according to patient's size, iterative reconstruction. COMPARISON: None FINDINGS: -LUNGS: Minimal bibasilar infiltrate/atelectasis, left greater than right.  Tiny bilateral pleural effusions are also present.  No definite pulmonary mass. -MEDIASTINUM/AXILLA: No significant adenopathy. -HEART/VESSELS: There are multiple bilateral pulmonary emboli.  Mild atherosclerosis.  Heart size is normal. -CHEST WALL: Normal. -LIVER: Normal in size and appearance.  -GALLBLADDER/BILE DUCTS: Gallbladder is collapsed.  No bile duct dilatation. -PANCREAS: There is a 2.2 cm mass in the body of the pancreas.  Masses compressing the splenic vein.  Pancreatic duct is dilated. -SPLEEN: Normal. -ADRENALS:  Normal. -KIDNEYS/URETERS: 6.7 cm cyst in the lower pole the left kidney.  No hydronephrosis or significant mass. -BLADDER: Normal. -REPRODUCTIVE ORGANS: No pelvic masses. -BOWEL: Mild distention of the transverse colon.  No inflammatory changes. -LYMPH NODES: No significant retroperitoneal, mesenteric, or pelvic adenopathy. -BONES: No fracture or significant bone lesion. -VASCULATURE: Normal -OTHER: There is peritoneal carcinomatosis with associated ascites.  Fluid is most prominent in the upper left abdomen.    ??  IMPRESSION: 1.  Primary pancreas tumor. 2.  Peritoneal carcinomatosis and ascites. 3.  Multiple bilateral pulmonary emboli. Critical results called  to Dr.  Delora Fuel  at 5:45 PM 789 If there are any questions about this report, I can be reached on [employer] or at [phone]     Assessment:     Active Problems:    Hypertension ()      Pulmonary emboli ([place]) (05/15/2019)      Pancreatic mass (05/15/2019)      Leukocytosis (05/15/2019)      Polycythemia (05/15/2019)    78 y.o. male with PMH of HTN and lumbar laminectomy, who is seen in consultation at the request of Dr. Laureen Ochs for one year hx of abdominal distention, anorexia, change in bowel habits, and pancreatic mass on CT. He was admitted 10/17 for progressive SOB without Chest pain and CT showed 2.2cm pancreatic body  tumor with peritoneal carcinomatosis and bilateral PE. He is on Heparin drip. Labs reveal leukocytosis and mild ALT elevation but are otherwise normal. PTT elevated as expeted on Heparin. Will need tissue biopsy via EUS.     Plan:     - will need EGD/EUS with FNA by either Dr. Mare Ferrari Tuesday or Dr. Tito Dine Wednesday   - will need to hold Heparin prior   - will also need oncology consult  - supportive care until this time.     Of note: he has EGD/ colo scheduled 10/28 with Dr. Augustin Coupe- Dr. Augustin Coupe made aware of current admission.     [place], [place]   ATTENDING NOTE:  I have seen and examined the patient along with my NP/PA. The assessment and plan above is my own.     We will elevate his case to the highest priority for our EUS physicians.  Will know by tomorrow when we can accomplish this.    Lavena Bullion, MD          Patient is seen and examined in collaboration with Dr. Yetta Flock.  Assessment and plan as per Dr. Yetta Flock.Marland Kitchen

## 2019-05-16 NOTE — Other (Signed)
Bedside and Verbal shift change report given to self (oncoming nurse) by  Jodi Mourning, RN (offgoing nurse). Report included the following information SBAR, Kardex, Intake/Output, MAR, and Recent Results.      Heparin gt verified per MAR.

## 2019-05-17 ENCOUNTER — Encounter: Payer: MEDICARE | Attending: Family Medicine | Primary: Family Medicine

## 2019-05-17 LAB — CBC WITH AUTOMATED DIFF
ABS. BASOPHILS: 0 10*3/uL (ref 0.0–0.2)
ABS. EOSINOPHILS: 0.1 10*3/uL (ref 0.0–0.8)
ABS. IMM. GRANS.: 0.2 10*3/uL (ref 0.0–0.5)
ABS. LYMPHOCYTES: 1.6 10*3/uL (ref 0.5–4.6)
ABS. MONOCYTES: 1.6 10*3/uL — ABNORMAL HIGH (ref 0.1–1.3)
ABS. NEUTROPHILS: 15 10*3/uL — ABNORMAL HIGH (ref 1.7–8.2)
ABSOLUTE NRBC: 0 10*3/uL (ref 0.0–0.2)
BASOPHILS: 0 % (ref 0.0–2.0)
EOSINOPHILS: 1 % (ref 0.5–7.8)
HCT: 48.5 % (ref 41.1–50.3)
HGB: 15.7 g/dL (ref 13.6–17.2)
IMMATURE GRANULOCYTES: 1 % (ref 0.0–5.0)
LYMPHOCYTES: 9 % — ABNORMAL LOW (ref 13–44)
MCH: 27.2 PG (ref 26.1–32.9)
MCHC: 32.4 g/dL (ref 31.4–35.0)
MCV: 84.1 FL (ref 79.6–97.8)
MONOCYTES: 9 % (ref 4.0–12.0)
MPV: 9.9 FL (ref 9.4–12.3)
NEUTROPHILS: 81 % — ABNORMAL HIGH (ref 43–78)
PLATELET: 237 10*3/uL (ref 150–450)
RBC: 5.77 M/uL — ABNORMAL HIGH (ref 4.23–5.6)
RDW: 13 % (ref 11.9–14.6)
WBC: 18.5 10*3/uL — ABNORMAL HIGH (ref 4.3–11.1)

## 2019-05-17 LAB — URINALYSIS W/ RFLX MICROSCOPIC
Bacteria: 0 /hpf
Blood: NEGATIVE
Glucose: NEGATIVE mg/dL
Ketone: NEGATIVE mg/dL
Leukocyte Esterase: NEGATIVE
Nitrites: NEGATIVE
Protein: 30 mg/dL — AB
Specific gravity: 1.047 — ABNORMAL HIGH (ref 1.001–1.023)
Urobilinogen: 1 EU/dL (ref 0.2–1.0)
pH (UA): 5.5 (ref 5.0–9.0)

## 2019-05-17 LAB — PLEASE READ & DOCUMENT PPD TEST IN 24 HRS
PPD: NEGATIVE Negative
mm Induration: 0 mm (ref 0–5)

## 2019-05-17 LAB — HEPARIN XA UFH
Heparin Xa UFH: 0.54 IU/mL (ref 0.3–0.7)
Heparin Xa UFH: 0.5 IU/mL (ref 0.3–0.7)

## 2019-05-17 LAB — CANCER AG 19-9: Cancer antigen 19-9: 2926.2 U/mL — ABNORMAL HIGH (ref 2.0–37.0)

## 2019-05-17 MED ORDER — OXYCODONE-ACETAMINOPHEN 5 MG-325 MG TAB
5-325 mg | ORAL | Status: DC | PRN
Start: 2019-05-17 — End: 2019-05-24
  Administered 2019-05-17 – 2019-05-22 (×4): via ORAL

## 2019-05-17 MED ORDER — OXYCODONE-ACETAMINOPHEN 10 MG-325 MG TAB
10-325 mg | ORAL | Status: DC | PRN
Start: 2019-05-17 — End: 2019-05-24
  Administered 2019-05-17 – 2019-05-18 (×3): via ORAL

## 2019-05-17 MED ORDER — SODIUM CHLORIDE 0.9 % INJECTION
1.1 mg/mL | INTRAMUSCULAR | Status: AC | PRN
Start: 2019-05-17 — End: 2019-05-17
  Administered 2019-05-17: 13:00:00 via INTRAVENOUS

## 2019-05-17 MED ORDER — PRUCALOPRIDE 2 MG TABLET
2 mg | Freq: Every day | ORAL | Status: DC
Start: 2019-05-17 — End: 2019-05-24
  Administered 2019-05-18: 13:00:00 via ORAL

## 2019-05-17 MED FILL — OXYCODONE-ACETAMINOPHEN 10 MG-325 MG TAB: 10-325 mg | ORAL | Qty: 1

## 2019-05-17 MED FILL — HEPARIN (PORCINE) IN D5W 25,000 UNIT/500 ML IV: 25000 unit/500 mL (50 unit/mL) | INTRAVENOUS | Qty: 500

## 2019-05-17 MED FILL — MAPAP (ACETAMINOPHEN) 325 MG TABLET: 325 mg | ORAL | Qty: 2

## 2019-05-17 MED FILL — OXYCODONE-ACETAMINOPHEN 5 MG-325 MG TAB: 5-325 mg | ORAL | Qty: 1

## 2019-05-17 MED FILL — FAMOTIDINE (PF) 20 MG/2 ML IV: 20 mg/2 mL | INTRAVENOUS | Qty: 2

## 2019-05-17 MED FILL — CALCIUM CARBONATE 200 MG (500 MG) CHEWABLE TAB: 200 mg calcium (500 mg) | ORAL | Qty: 1

## 2019-05-17 NOTE — Progress Notes (Signed)
H&P/Consult Note/Progress Note/Office Note:   Matthew Sherman  MRN: [id number]  DOB:22-Jan-1941  Age:78 y.o.     General Surgery Consult ordered by: Delbert Phenix, NP  Reason for General Surgery Consult: Pancreatic Mass    HPI: Matthew Sherman is a 78 y.o. male with PMH of HTN, who is seen in consultation at the request of Delbert Phenix, NP for pancreatic mass noted on 05/15/19 CT. Pt was admitted 10/17 for progressive SOB. Pt reports he has been experiencing intermittent worsening abdominal pain, distention, and anorexia over the past year.   Pt also c/o N/V this am.     05/15/19 CT Chest/Abd/Pelvis  IMPRESSION:   1.  Primary pancreas tumor.  2.  Peritoneal carcinomatosis and ascites.  3.  Multiple bilateral pulmonary emboli.    05/17/2019  Pt denies abdominal pain this AM.  Appears SOB.       Past Medical History:   Diagnosis Date   ??? Hypertension      Past Surgical History:   Procedure Laterality Date   ??? HX APPENDECTOMY  1956   ??? HX CATARACT REMOVAL Bilateral     right done 2018, left 2002   ??? HX LUMBAR LAMINECTOMY  06/09/2018    lami L2-3. L3-4 4-5 Dr. Bradd Canary   ??? HX TONSIL AND ADENOIDECTOMY       Current Facility-Administered Medications   Medication Dose Route Frequency   ??? famotidine (PF) (PEPCID) 20 mg in 0.9% sodium chloride 10 mL injection  20 mg IntraVENous Q12H   ??? oxyCODONE-acetaminophen (PERCOCET) 5-325 mg per tablet 1 Tab  1 Tab Oral Q4H PRN   ??? oxyCODONE-acetaminophen (PERCOCET 10) 10-325 mg per tablet 1 Tab  1 Tab Oral Q4H PRN   ??? heparin 25,000 units in dextrose 500 mL infusion  18-36 Units/kg/hr IntraVENous TITRATE   ??? sodium chloride (NS) flush 5-40 mL  5-40 mL IntraVENous Q8H   ??? sodium chloride (NS) flush 5-40 mL  5-40 mL IntraVENous PRN   ??? acetaminophen (TYLENOL) tablet 650 mg  650 mg Oral Q6H PRN    Or   ??? acetaminophen (TYLENOL) suppository 650 mg  650 mg Rectal Q6H PRN   ??? polyethylene glycol (MIRALAX) packet 17 g  17 g Oral DAILY PRN    ??? ondansetron (ZOFRAN) injection 4 mg  4 mg IntraVENous Q6H PRN   ??? 0.9% sodium chloride infusion  75 mL/hr IntraVENous CONTINUOUS     Protonix [pantoprazole]  Social History     Socioeconomic History   ??? Marital status: MARRIED     Spouse name: Not on file   ??? Number of children: Not on file   ??? Years of education: Not on file   ??? Highest education level: Not on file   Tobacco Use   ??? Smoking status: Never Smoker   ??? Smokeless tobacco: Never Used   Substance and Sexual Activity   ??? Alcohol use: Never     Frequency: Never   ??? Drug use: Never   ??? Sexual activity: Not Currently     Partners: Female     Social History     Tobacco Use   Smoking Status Never Smoker   Smokeless Tobacco Never Used     Family History   Problem Relation Age of Onset   ??? Heart Attack Father    ??? No Known Problems Sister    ??? No Known Problems Brother    ??? No Known Problems Maternal Grandmother    ??? No  Known Problems Maternal Grandfather    ??? No Known Problems Paternal Grandmother    ??? No Known Problems Paternal Grandfather      ROS: Comprehensive review of systems was otherwise unremarkable except as noted above. + SOB    Physical Exam:   Visit Vitals  BP 120/76 (BP 1 Location: Left arm, BP Patient Position: At rest)   Pulse 80   Temp 97.5 ??F (36.4 ??C)   Resp 18   Ht 6' (1.829 m)   Wt 211 lb 9.6 oz (96 kg)   SpO2 92%   BMI 28.70 kg/m??     Vitals:    05/17/19 0020 05/17/19 0430 05/17/19 0432 05/17/19 0911   BP: 123/76 131/76  120/76   Pulse: 87 85  80   Resp: '22 22  18   ' Temp: 98.1 ??F (36.7 ??C) 97.8 ??F (36.6 ??C)  97.5 ??F (36.4 ??C)   SpO2: 97% 98%  92%   Weight:   211 lb 9.6 oz (96 kg)    Height:         No intake/output data recorded.  10/17 1901 - 10/19 0700  In: 300 [P.O.:300]  Out: 300 [Urine:300]    Constitutional: Alert, oriented, cooperative   Eyes:Sclera are clear. EOMs intact  ENMT: no external lesions gross hearing normal; no obvious neck masses, no ear or lip lesions, nares normal  CV: RRR. Normal perfusion   Resp: No JVD.  Breathing is slightly labored; no audible wheezing.    GI: using abdominal muscles to breathe; soft, distended,+ ascites; minimal generalized ttp; hypoactive BS  Musculoskeletal: No edema or cyanosis.   Neuro: no focal deficits  Psychiatric: normal affect and mood, no memory impairment    Recent vitals (if inpt):  Patient Vitals for the past 24 hrs:   BP Temp Pulse Resp SpO2 Weight   05/17/19 0911 120/76 97.5 ??F (36.4 ??C) 80 18 92 % ???   05/17/19 0432 ??? ??? ??? ??? ??? 211 lb 9.6 oz (96 kg)   05/17/19 0430 131/76 97.8 ??F (36.6 ??C) 85 22 98 % ???   05/17/19 0020 123/76 98.1 ??F (36.7 ??C) 87 22 97 % ???   05/16/19 2032 129/73 97.8 ??F (36.6 ??C) 90 24 97 % ???   05/16/19 1742 129/77 97.7 ??F (36.5 ??C) 90 20 99 % ???   05/16/19 1233 (!) 146/75 98.2 ??F (36.8 ??C) 87 20 97 % ???   05/16/19 1200 ??? ??? ??? ??? 99 % ???       Labs:  Recent Labs     05/17/19  0502 05/16/19  0104 05/15/19  2048 05/15/19  1416   WBC 18.5* 17.7* 18.4* 18.9*   HGB 15.7 15.6 16.9 17.7*   PLT 237 217 235 269   NA  --  137  --  134*   K  --  3.9  --  3.8   CL  --  103  --  98   CO2  --  25  --  28   BUN  --  12  --  11   CREA  --  0.69*  --  1.10   GLU  --  133*  --  133*   APTT  --   --  198.7*  --    TBILI  --  0.7  --  0.9   CBIL  --  0.2  --   --    ALT  --  66*  --  97*  AP  --  80  --  105   LPSE  --   --   --  42*       Lab Results   Component Value Date/Time    WBC 18.5 (H) 05/17/2019 05:02 AM    HGB 15.7 05/17/2019 05:02 AM    PLATELET 237 05/17/2019 05:02 AM    Sodium 137 05/16/2019 01:04 AM    Potassium 3.9 05/16/2019 01:04 AM    Chloride 103 05/16/2019 01:04 AM    CO2 25 05/16/2019 01:04 AM    BUN 12 05/16/2019 01:04 AM    Creatinine 0.69 (L) 05/16/2019 01:04 AM    Glucose 133 (H) 05/16/2019 01:04 AM    aPTT 198.7 (HH) 05/15/2019 08:48 PM    Bilirubin, total 0.7 05/16/2019 01:04 AM    Bilirubin, direct 0.2 05/16/2019 01:04 AM    ALT (SGPT) 66 (H) 05/16/2019 01:04 AM    Alk. phosphatase 80 05/16/2019 01:04 AM    Lipase 42 (L) 05/15/2019 02:16 PM        CT Results  (Last 48 hours)               05/15/19 1727  CT CHEST ABD PELV W CONT Final result    Impression:  IMPRESSION:    1.  Primary pancreas tumor.   2.  Peritoneal carcinomatosis and ascites.   3.  Multiple bilateral pulmonary emboli.       Critical results called to Dr.  Delora Fuel  at 5:45 PM       789       If there are any questions about this report, I can be reached on [employer]   or at [phone]               Narrative:  CT of the Chest, Abdomen, and Pelvis       INDICATION: Shortness of breath and abdominal pain       Multiple axial images were obtained through the chest, abdomen, and pelvis.    Oral contrast was used for bowel opacification.  117m of Isovue 370 intravenous   contrast was used for better evaluation of solid organs and vascular structures.    Radiation dose reduction techniques were used for this study.  All CT scans   performed at this facility use one or all of the following: Automated exposure   control, adjustment of the mA and/or kVp according to patient's size, iterative   reconstruction.       COMPARISON: None       FINDINGS:   -LUNGS: Minimal bibasilar infiltrate/atelectasis, left greater than right.  Tiny   bilateral pleural effusions are also present.  No definite pulmonary mass.   -MEDIASTINUM/AXILLA: No significant adenopathy.   -HEART/VESSELS: There are multiple bilateral pulmonary emboli.  Mild   atherosclerosis.  Heart size is normal.   -CHEST WALL: Normal.       -LIVER: Normal in size and appearance.     -GALLBLADDER/BILE DUCTS: Gallbladder is collapsed.  No bile duct dilatation.   -PANCREAS: There is a 2.2 cm mass in the body of the pancreas.  Masses   compressing the splenic vein.  Pancreatic duct is dilated.   -SPLEEN: Normal.       -ADRENALS:  Normal.   -KIDNEYS/URETERS: 6.7 cm cyst in the lower pole the left kidney.  No   hydronephrosis or significant mass.   -BLADDER: Normal.   -REPRODUCTIVE ORGANS: No pelvic masses.        -BOWEL: Mild distention  of the transverse colon.  No inflammatory changes.   -LYMPH NODES: No significant retroperitoneal, mesenteric, or pelvic adenopathy.   -BONES: No fracture or significant bone lesion.   -VASCULATURE: Normal   -OTHER: There is peritoneal carcinomatosis with associated ascites.  Fluid is   most prominent in the upper left abdomen.                 chest X-ray      I reviewed recent labs, recent radiologic studies, and pertinent records including other doctor notes if needed.    I independently reviewed radiology images for studies I described above or studies I have ordered.   Admission date (for inpatients): 05/15/2019   * No surgery found *  Procedure(s):  ENDOSCOPIC ULTRASOUND (EUS)/ BMI 29 ROOM 321    ASSESSMENT/PLAN:  Problem List  Date Reviewed: 05-21-19          Codes Class Noted    Pulmonary emboli [facility]) ICD-10-CM: I26.99  ICD-9-CM: 415.19  05/15/2019        Pancreatic mass ICD-10-CM: K86.89  ICD-9-CM: 577.8  05/15/2019        Leukocytosis ICD-10-CM: D72.829  ICD-9-CM: 288.60  05/15/2019        Polycythemia ICD-10-CM: D75.1  ICD-9-CM: 238.4  05/15/2019        S/P laminectomy ICD-10-CM: Z61.096  ICD-9-CM: V45.89  06/11/2018        Spinal stenosis of lumbar region with neurogenic claudication ICD-10-CM: M48.062  ICD-9-CM: 724.03  06/09/2018        Hypertension ICD-10-CM: I10  ICD-9-CM: 401.9  Unknown            Active Problems:    Hypertension ()      Pulmonary emboli ([place]) (05/15/2019)      Pancreatic mass (05/15/2019)      Leukocytosis (05/15/2019)      Polycythemia (05/15/2019)         Plan:  Care management per Hospitalist  --IV Heparin gtt for Bilateral PE  --ECHO pending  GI Following  --Pending EGD.EUS 10-20  Oncology following  ---CEA and Ca 19-9  General Surgery     Plan port, diagnostic laparoscopy with abdominal PleurX catheter Wed/Thurs timing per Dr. Levonne Hubert, PA

## 2019-05-17 NOTE — Other (Signed)
Bedside report given to [facility], [place]. Opportunity for questions, concerns. Patient involved.

## 2019-05-17 NOTE — Progress Notes (Addendum)
Problem: Patient Education: Go to Patient Education Activity  Goal: Patient/Family Education  Outcome: Progressing Towards Goal  Note:   1. Patient will complete total body bathing and dressing with independence.   2. Patient will complete toileting with independence.   3. Patient will tolerate 30 minutes of OT treatment with self-incorporated rest breaks to increase activity tolerance for ADLs.   4. Patient will complete functional transfers with independence.   5. Patient will complete functional mobility for ADLs with independence.   6. Patient will demonstrate modified independence with therapeutic exercise HEP to increase strength in BUEs for increased safety and independence with functional transfers.     Timeframe: 7 visits        OCCUPATIONAL THERAPY: Initial Assessment, Daily Note, and AM 05/17/2019  INPATIENT: OT Visit Days: 1  Payor: [employer] MEDICARE / Plan: [place] PPO/PFFS / Product Type: Managed Care Medicare /      NAME/AGE/GENDER: Matthew Sherman is a 78 y.o. male   PRIMARY DIAGNOSIS:  Pulmonary emboli ([facility]) [I26.99] <principal problem not specified> <principal problem not specified>  Procedure(s) (LRB):  ENDOSCOPIC ULTRASOUND (EUS)/ BMI 29 ROOM 321 (N/A)     ICD-10: Treatment Diagnosis:    ?? Generalized Muscle Weakness (M62.81)   Precautions/Allergies:     Protonix [pantoprazole]      ASSESSMENT:     Mr. Matthew Sherman is a 78 y.o. male admitted with SOB and abdominal pain, found to have PE and pancreatic tumor. At baseline pt lives with wife and reports independence with ADLs, IADLs, ambulation. No hx falls, no supplemental O2 use.     Upon arrival pt alert and agreeable to OT evaluation and treatment, O2 sats stable on 3L O2 NC. Pt completed bed mobility with MinA, intact sitting balance. Pt practiced functional transfers for ADLs with CGA progressing to SBA, cues for hand placement for safety. Ambulation for  ADLs with CGA initially, pt slightly unsteady and reaching for external support. Practiced ambulation of household distances with RW/SBA, cues for posture and RW management, steadiness improved. Cues throughout for breathing technique, pacing, and education on energy conservation. Weaned to 2L O2 NC with sats stable at 95% with activity. Pt practiced grooming in standing with verbal cueing, pt with some mild intermittent confusion. Cues throughout for energy conservation and breathing technique. Pt reports some chest pain with activity. RN notified. Pt left seated in chair with call bell within reach. Pt presents with deficits in SOB, energy conservation, activity tolerance, balance, and ambulation. Matthew Sherman is currently functioning below baseline and would benefit from continued OT to increase safety and independence with ADLs. Will follow.      This section established at most recent assessment   PROBLEM LIST (Impairments causing functional limitations):  1. Decreased Strength  2. Decreased ADL/Functional Activities  3. Decreased Transfer Abilities  4. Decreased Ambulation Ability/Technique  5. Decreased Balance  6. Increased Pain  7. Decreased Activity Tolerance  8. Decreased Pacing Skills  9. Decreased Work Herbalist  10. Increased Fatigue  11. Increased Shortness of Breath  12. Decreased Independence with Home Exercise Program  13. Decreased Cognition   INTERVENTIONS PLANNED: (Benefits and precautions of occupational therapy have been discussed with the patient.)  1. Activities of daily living training  2. Adaptive equipment training  3. Balance training  4. Clothing management  5. Cognitive training  6. Community reintergration  7. Donning&doffing training  8. Hygiene training  9. Neuromuscular re-eduation  10. Re-evaluation  11.  Therapeutic activity  12. Therapeutic exercise     TREATMENT PLAN: Frequency/Duration: Follow patient 3x/week to address  above goals.  Rehabilitation Potential For Stated Goals: Good     REHAB RECOMMENDATIONS (at time of discharge pending progress):    Placement:  It is my opinion, based on this patient's performance to date, that Mr. Matthew Sherman may benefit from being discharged with NO further skilled therapy due to the high likelihood of returning to baseline. Vs HHOT pending progress   Equipment:   ? TBD              OCCUPATIONAL PROFILE AND HISTORY:   History of Present Injury/Illness (Reason for Referral):  "CC:  Shortness of breath   Matthew Sherman is a 78 yo male with PMH of HTN and Lumbar laminectomy who is evaluated with progressive shortness of breath. He was seen by GI last week for abdominal distention, anorexia and scheduled for EGD/ colonoscopy. He reports some loose, jelly like BM for several months. No rectal bleeding. No weight loss. No chest pain. He has anorexia.   CT Chest/ AP done in the ED and shows primary pancreas tumor, peritoneal carcinomatosis and bilateral PE on preliminary report.   His daughter Matthew Sherman is present and I also discussed case with daughter 83 via phone.     10 systems reviewed and negative except as noted in HPI.- denies ear or throat pains, no arthritis, no confusion or memory loss, no edema"  Past Medical History/Comorbidities:   Matthew Sherman  has a past medical history of Hypertension.  Matthew Sherman  has a past surgical history that includes hx appendectomy (1956); hx cataract removal (Bilateral); hx tonsil and adenoidectomy; and hx lumbar laminectomy (06/09/2018).  Social History/Living Environment:   Home Environment: Private residence  # Steps to Enter: 2  Rails to Enter: No  One/Two Story Residence: One story  Living Alone: No  Support Systems: Copy  Patient Expects to be Discharged to:: Private residence  Current DME Used/Available at Home: None  Tub or Shower Type: Tub/Shower combination  Prior Level of Function/Work/Activity:   At baseline pt lives with wife and reports independence with ADLs, IADLs, ambulation. No hx falls, no supplemental O2 use.      Number of Personal Factors/Comorbidities that affect the Plan of Care: Brief history (0):  Sherman COMPLEXITY   ASSESSMENT OF OCCUPATIONAL PERFORMANCE::   Activities of Daily Living:   Basic ADLs (From Assessment) Complex ADLs (From Assessment)   Feeding: Independent  Oral Facial Hygiene/Grooming: Stand-by assistance  Bathing: Stand-by assistance  Upper Body Dressing: Setup  Lower Body Dressing: Stand-by assistance  Toileting: Stand by assistance     Grooming/Bathing/Dressing Activities of Daily Living   Grooming  Washing Hands: Stand-by assistance  Cues: Verbal cues provided Cognitive Retraining  Safety/Judgement: Fall prevention                 Functional Transfers  Bathroom Mobility: Stand-by assistance     Bed/Mat Mobility  Supine to Sit: Minimum assistance  Sit to Stand: Contact guard assistance  Stand to Sit: Stand-by assistance  Bed to Chair: Stand-by assistance  Scooting: Stand-by assistance     Most Recent Physical Functioning:   Gross Assessment:  AROM: Within functional limits(BUEs)  Strength: Generally decreased, functional(BUEs)  Coordination: Within functional limits(BUEs)               Posture:     Balance:  Sitting: Intact  Standing: Impaired  Standing - Static: Good;Fair  Standing - Dynamic :  Fair Bed Mobility:  Supine to Sit: Minimum assistance  Scooting: Stand-by assistance  Wheelchair Mobility:     Transfers:  Sit to Stand: Contact guard assistance  Stand to Sit: Stand-by assistance  Bed to Chair: Stand-by assistance            Patient Vitals for the past 6 hrs:   BP BP Patient Position SpO2 O2 Flow Rate (L/min) Pulse   05/17/19 0820 ??? ??? ??? 3 l/min ???   05/17/19 0911 120/76 At rest 92 % 3 l/min 80   05/17/19 1018 ??? ??? 95 % 2 l/min ???   05/17/19 1047 ??? ??? 95 % 2 l/min ???   05/17/19 1229 ??? ??? ??? 2 l/min ???   05/17/19 1248 120/78 ??? 92 % ??? 84       Mental Status   Neurologic State: Alert, Confused  Orientation Level: Oriented to situation, Oriented to place, Oriented to person, Other (Comment)(disoriented to time of day )  Cognition: Follows commands  Perception: Appears intact  Perseveration: No perseveration noted  Safety/Judgement: Fall prevention                          Physical Skills Involved:  1. Balance  2. Strength  3. Activity Tolerance  4. Pain (acute) Cognitive Skills Affected (resulting in the inability to perform in a timely and safe manner):  1. Executive Function Psychosocial Skills Affected:  1. Habits/Routines  2. Environmental Adaptation  3. Social Interaction  4. Emotional Regulation  5. Self-Awareness  6. Awareness of Others  7. Social Roles   Number of elements that affect the Plan of Care: 5+:  HIGH COMPLEXITY   CLINICAL DECISION MAKING:   KB Home	[place] AM-PAC??? ???6 Clicks???   Daily Activity Inpatient Short Form  How much help from another person does the patient currently need... Total A Lot A Little None   1.  Putting on and taking off regular lower body clothing?   []  1   []  2   [x]  3   []  4   2.  Bathing (including washing, rinsing, drying)?   []  1   []  2   [x]  3   []  4   3.  Toileting, which includes using toilet, bedpan or urinal?   []  1   []  2   [x]  3   []  4   4.  Putting on and taking off regular upper body clothing?   []  1   []  2   [x]  3   []  4   5.  Taking care of personal grooming such as brushing teeth?   []  1   []  2   [x]  3   []  4   6.  Eating meals?   []  1   []  2   []  3   [x]  4   ?? 2007, [employer], under license to [place]. All rights reserved      Score:  Initial: 19 05/17/2019 Most Recent: X (Date: -- )    Interpretation of Tool:  Represents activities that are increasingly more difficult (i.e. Bed mobility, Transfers, Gait).    Medical Necessity:     ?? Patient demonstrates   ?? good  ??  rehab potential due to higher previous functional level.  Reason for Services/Other Comments:   ?? Patient continues to require skilled intervention due to   ?? Inability to complete ADLs at prior level of independence  ?? .  Use of outcome tool(s) and clinical judgement create a POC that gives a: Sherman COMPLEXITY         TREATMENT:   (In addition to Assessment/Re-Assessment sessions the following treatments were rendered)     Pre-treatment Symptoms/Complaints:    Pain: Initial:   Pain Intensity 1: 0  Post Session:  same     Today's treatment session addressed Decreased Strength, Decreased ADL/Functional Activities, Decreased Transfer Abilities, Decreased Ambulation Ability/Technique, Decreased Balance, Increased Pain, Decreased Activity Tolerance, Decreased Pacing Skills, Decreased Work Simplification/Energy Conservation Techniques, Increased Fatigue, Increased Shortness of Breath, and Decreased Cognition to progress towards achieving goal(s). During this session,  Physical Therapy addressed  Ambulation to progress towards their discipline specific goal(s).  Co-treatment was necessary to improve patient's ability to follow higher level commands, ability to increase activity demands, and ability to return to normal functional activity.      Self Care: (23 minutes): Procedure(s) (per grid) utilized to improve and/or restore self-care/home management as related to grooming and energy conservation training to improve safety/independence with I/ADLs . Required minimal visual, verbal, and manual cueing to facilitate activities of daily living skills and compensatory activities.    Braces/Orthotics/Lines/Etc:   ?? IV  ?? O2 Device: Nasal cannula  Treatment/Session Assessment:    ?? Response to Treatment:  Tolerated well   ?? Interdisciplinary Collaboration:   o Physical Therapist  o Occupational Therapist  o Registered Nurse  o Physician  ?? After treatment position/precautions:   o Up in chair  o Bed/Chair-wheels locked  o Bed in Sherman position  o Call light within reach  o RN notified  o Family at bedside    ?? Compliance with Program/Exercises: Compliant all of the time, Will assess as treatment progresses.  ?? Recommendations/Intent for next treatment session:  "Next visit will focus on advancements to more challenging activities and reduction in assistance provided".  Total Treatment Duration:  OT Patient Time In/Time Out  Time In: 1015  Time Out: [place], OTR/L

## 2019-05-17 NOTE — Progress Notes (Signed)
Problem: Mobility Impaired (Adult and Pediatric)  Goal: *Acute Goals and Plan of Care (Insert Text)  Outcome: Progressing Towards Goal  Note: LTG:  (1.)[name] will move from supine to sit and sit to supine , scoot up and down, and roll side to side in bed with INDEPENDENCE within 7 treatment day(s).    (2.)[name] will transfer from bed to chair and chair to bed with MODIFIED INDEPENDENCE using the least restrictive device within 7 treatment day(s).    (3.)[name] will ambulate with MODIFIED INDEPENDENCE for 500 feet with the least restrictive device within 7 treatment day(s).  (4.)[name] will participate in therapeutic activity/exercises x 25 minutes for increased activity tolerance within 7 days.  (5.)[name] will ascend and descend 2 stairs using 0 hand rail(s) with SUPERVISION to improve functional mobility and safety within 7 day(s).    ________________________________________________________________________________________________         PHYSICAL THERAPY: Initial Assessment and AM 05/17/2019  INPATIENT: PT Visit Days : 1  Payor: [employer] MEDICARE / Plan: [place] PPO/PFFS / Product Type: Managed Care Medicare /       NAME/AGE/GENDER: Matthew Sherman is a 78 y.o. male   PRIMARY DIAGNOSIS: Pulmonary emboli (HCC) [I26.99] <principal problem not specified> <principal problem not specified>  Procedure(s) (LRB):  ENDOSCOPIC ULTRASOUND (EUS)/ BMI 29 ROOM 321 (N/A)     ICD-10: Treatment Diagnosis:    Generalized Muscle Weakness (M62.81)  Difficulty in walking, Not elsewhere classified (R26.2)   Precaution/Allergies:  Protonix [pantoprazole]      ASSESSMENT:     [name] is a 78 y.o. male in the hospital for the above who was supine in bed upon arrival.  Evaluation and treatment performed with OT today who addressed ADLs and energy conservation throughout.  [name] presents to PT with [name] AROM and decreased strength in B LEs.  Pt also presents with  decreased B LE coordination and was on 3 L/min O2.  Pt performed bed mobility with SBA and intact sitting balance.  Ambulating in room and sTS transfers performed with SBA-CGA due to good-fair balance.  Pt used RW in hall and tolerated weaning O2 from 3 to 2 L/min.  [name] could benefit from skilled PT as he is currently functioning below his baseline.      This section established at most recent assessment   PROBLEM LIST (Impairments causing functional limitations):  Decreased Strength  Decreased Ambulation Ability/Technique  Decreased Balance  Decreased Activity Tolerance  Increased Fatigue  Decreased Cognition   INTERVENTIONS PLANNED: (Benefits and precautions of physical therapy have been discussed with the patient.)  Balance Exercise  Bed Mobility  Family Education  Gait Training  Home Exercise Program (HEP)  Neuromuscular Re-education/Strengthening  Therapeutic Activites  Therapeutic Exercise/Strengthening  Transfer Training     TREATMENT PLAN: Frequency/Duration: 3 times a week for duration of hospital stay  Rehabilitation Potential For Stated Goals: Good     REHAB RECOMMENDATIONS (at time of discharge pending progress):    Placement:  It is my opinion, based on this patient's performance to date, that [name] may benefit from being discharged with NO further skilled therapy due to all goals being met.  Equipment:   None at this time              HISTORY:   History of Present Injury/Illness (Reason for Referral):  B pulmonary emboli  Past Medical History/Comorbidities:   [name]  has a past medical history of Hypertension.  [name]  has a past surgical history that includes hx appendectomy (1956); hx cataract removal (Bilateral); hx tonsil and adenoidectomy; and hx lumbar laminectomy (06/09/2018).  Social History/Living Environment:   Home Environment: Private residence  # Steps to Enter: 2  One/Two Story Residence: One story  Living Alone: No  Support Systems: Copy   Patient Expects to be Discharged to:: Private residence  Current DME Used/Available at Home: None  Prior Level of Function/Work/Activity:  Lives with spouse and PTA independent with ambulation.     Number of Personal Factors/Comorbidities that affect the Plan of Care: 1-2: MODERATE COMPLEXITY   EXAMINATION:   Most Recent Physical Functioning:   Gross Assessment:  AROM: Within functional limits  Strength: Generally decreased, functional  Coordination: Generally decreased, functional               Posture:     Balance:  Sitting: Intact  Standing: Impaired  Standing - Static: Good;Fair  Standing - Dynamic : Fair Bed Mobility:  Supine to Sit: Minimum assistance  Scooting: Stand-by assistance  Wheelchair Mobility:     Transfers:  Sit to Stand: Contact guard assistance  Stand to Sit: Stand-by assistance  Bed to Chair: Stand-by assistance  Gait:     Speed/Cadence: Slow  Step Length: Right shortened;Left shortened  Gait Abnormalities: Trunk sway increased  Distance (ft): 250 Feet (ft)  Ambulation - Level of Assistance: Stand-by assistance;Contact guard assistance      Body Structures Involved:  Lungs  Digestive Structures  Muscles Body Functions Affected:  Respiratory  Neuromusculoskeletal  Movement Related  Digestive Activities and Participation Affected:  Mobility  Community, Social and [employer]   Number of elements that affect the Plan of Care: 4+: HIGH COMPLEXITY   CLINICAL PRESENTATION:   Presentation: Stable and uncomplicated: LOW COMPLEXITY   CLINICAL DECISION MAKING:   KB Home	[place] AM-PAC??? ???6 Clicks???   Basic Mobility Inpatient Short Form  How much difficulty does the patient currently have... Unable A Lot A Little None   1.  Turning over in bed (including adjusting bedclothes, sheets and blankets)?   '[]'  1   '[]'  2   '[]'  3   '[x]'  4   2.  Sitting down on and standing up from a chair with arms ( e.g., wheelchair, bedside commode, etc.)   '[]'  1   '[]'  2   '[]'  3   '[x]'  4    3.  Moving from lying on back to sitting on the side of the bed?   '[]'  1   '[]'  2   '[x]'  3   '[]'  4   How much help from another person does the patient currently need... Total A Lot A Little None   4.  Moving to and from a bed to a chair (including a wheelchair)?   '[]'  1   '[]'  2   '[x]'  3   '[]'  4   5.  Need to walk in hospital room?   '[]'  1   '[]'  2   '[x]'  3   '[]'  4   6.  Climbing 3-5 steps with a railing?   '[]'  1   '[]'  2   '[x]'  3   '[]'  4   ?? 2007, Trustees of [place], under license to [employer]. All rights reserved      Score:  Initial: 20 Most Recent: X (Date: -- )    Interpretation of Tool:  Represents activities that are increasingly more difficult (i.e. Bed mobility, Transfers, Gait).  Medical Necessity:     Patient demonstrates   good   rehab potential due to higher previous functional level.  Reason for Services/Other Comments:  Patient continues to require skilled intervention due to   Decreased balance and activity tolerance  .   Use of outcome tool(s) and clinical judgement create a POC that gives a: Clear prediction of patient's progress: LOW COMPLEXITY            TREATMENT:   (In addition to Assessment/Re-Assessment sessions the following treatments were rendered)   Pre-treatment Symptoms/Complaints:  NO complaints  Pain: Initial:   Pain Intensity 1: 0  Post Session:  0     Therapeutic Exercise: ( 23 minutes):  Exercises including STS transfers, ambulating in hall, and sitting/standing activities to improve mobility, strength, balance, and activity tolerance .  Required minimal visual and verbal cues to promote proper body posture and promote proper body breathing techniques.  Progressed complexity of movement and ambulation distance  as indicated.        Braces/Orthotics/Lines/Etc:   IV  O2 Device: Nasal cannula  Treatment/Session Assessment:    Response to Treatment:  Tolerated well but appeared to have moments of confusion.  Interdisciplinary Collaboration:   Physical Therapist  Occupational Therapist   Registered Nurse  After treatment position/precautions:   Up in chair  Bed/Chair-wheels locked  Bed in low position  Call light within reach  RN notified  Family at bedside   Compliance with Program/Exercises: Will assess as treatment progresses  Recommendations/Intent for next treatment session:  "Next visit will focus on advancements to more challenging activities and reduction in assistance provided".  Total Treatment Duration:  PT Patient Time In/Time Out  Time In: 1018  Time Out: [place], PT, DPT

## 2019-05-17 NOTE — Other (Signed)
Bedside report received from [place], [place]. Opportunity for questions, concerns. Patient involved.

## 2019-05-17 NOTE — Progress Notes (Addendum)
Hospitalist Note     Admit Date:  05/15/2019  2:14 PM   Name:  Matthew Sherman   Age:  78 y.o.  DOB:  25-May-1941   MRN:  [id number]   PCP:  Olga Millers, DO  Treatment Team: Attending Provider: Orion Modest, MD; Consulting Provider: Lavena Bullion, MD; Consulting Provider: Lindalou Hose, MD; Surgeon: Thayer Jew, MD; Consulting Provider: Thayer Jew, MD; Staff Nurse: Shari Prows; Physical Therapist: Janyth Pupa, PT, DPT; Occupational Therapist: Larae Grooms, OTR/L; Care Manager: Herbert Seta, RN; Utilization Review: Loyce Dys, RN    HPI/Subjective:       Matthew Sherman is a 78 yo male with PMH of HTN and Lumbar laminectomy who is evaluated with progressive shortness of breath and found with bilateral PE and pancreatitic tumor.   CT Chest/ AP done in the ED and shows primary pancreas tumor, peritoneal carcinomatosis and bilateral PE.  He has been placed on IV heparin. Duplex LE no DVT. ECHO pending.   GI, surgery Dr. Sheria Lang and oncology consulted, pending EGD and EUS with FNA. Also planning diagnostic lap, port and abdominal pleurex for 10-21.  Discharge plans pending to home.         05-17-19 wife present, not eating too much, no BM, has abdominal distention, some occasional pain and dyspnea,        Objective:     Patient Vitals for the past 24 hrs:   Temp Pulse Resp BP SpO2   05/17/19 0911 97.5 ??F (36.4 ??C) 80 18 120/76 92 %   05/17/19 0430 97.8 ??F (36.6 ??C) 85 22 131/76 98 %   05/17/19 0020 98.1 ??F (36.7 ??C) 87 22 123/76 97 %   05/16/19 2032 97.8 ??F (36.6 ??C) 90 24 129/73 97 %   05/16/19 1742 97.7 ??F (36.5 ??C) 90 20 129/77 99 %   05/16/19 1233 98.2 ??F (36.8 ??C) 87 20 (!) 146/75 97 %   05/16/19 1200 ??? ??? ??? ??? 99 %     Oxygen Therapy  O2 Sat (%): 92 % (05/17/19 0911)  Pulse via Oximetry: 87 beats per minute (05/16/19 1200)  O2 Device: Nasal cannula (05/17/19 0911)  O2 Flow Rate (L/min): 3 l/min (05/17/19 0911)     Estimated body mass index is 28.7 kg/m?? as calculated from the following:    Height as of this encounter: 6' (1.829 m).    Weight as of this encounter: 96 kg (211 lb 9.6 oz).      Intake/Output Summary (Last 24 hours) at 05/17/2019 1125  Last data filed at 05/17/2019 0706  Gross per 24 hour   Intake 120 ml   Output 300 ml   Net -180 ml       *Note that automatically entered I/Os may not be accurate; dependent on patient compliance with collection and accurate data entry by techs.    General:    Well nourished.  Alert.  No distress   CV:   RRR.  No murmur, rub, or gallop. No edema  Lungs:   CTAB.  No wheezing, rhonchi, or rales. anterior  Abdomen:   Distended, decreased BS, nontender   Extremities: Warm and dry   Skin:     No rashes or jaundice.   Neuro:  No gross focal deficits    Data Review:  I have reviewed all labs, meds, and studies from the last 24 hours:  Recent Results (from the past 24 hour(s))   HEPARIN XA UFH  Collection Time: 05/16/19  3:43 PM   Result Value Ref Range    Heparin Xa UFH 0.48 0.3 - 0.7 IU/mL   CEA    Collection Time: 05/16/19  3:43 PM   Result Value Ref Range    CEA 2.5 0.0 - 3.0 ng/mL   CANCER AG 19-9    Collection Time: 05/16/19  3:43 PM   Result Value Ref Range    Cancer antigen 19-9 2,926.20 (H) 2.0 - 37.0 U/mL   PLEASE READ & DOCUMENT PPD TEST IN 24 HRS    Collection Time: 05/16/19  8:30 PM   Result Value Ref Range    PPD Negative Negative    mm Induration 0 0 - 5 mm   HEPARIN XA UFH    Collection Time: 05/16/19  9:37 PM   Result Value Ref Range    Heparin Xa UFH 0.54 0.3 - 0.7 IU/mL   URINALYSIS W/ RFLX MICROSCOPIC    Collection Time: 05/16/19 10:46 PM   Result Value Ref Range    Color ORANGE      Appearance CLEAR      Specific gravity 1.047 (H) 1.001 - 1.023      pH (UA) 5.5 5.0 - 9.0      Protein 30 (A) NEG mg/dL    Glucose Negative mg/dL    Ketone Negative NEG mg/dL    Bilirubin MODERATE (A) NEG      Blood Negative NEG      Urobilinogen 1.0 0.2 - 1.0 EU/dL     Nitrites Negative NEG      Leukocyte Esterase Negative NEG      WBC 0-3 0 /hpf    RBC 0-3 0 /hpf    Epithelial cells 0-3 0 /hpf    Bacteria 0 0 /hpf    Casts HYALINE 0 /lpf    Mucus 2+ (H) 0 /lpf    Other observations RESULTS VERIFIED MANUALLY     CBC WITH AUTOMATED DIFF    Collection Time: 05/17/19  5:02 AM   Result Value Ref Range    WBC 18.5 (H) 4.3 - 11.1 K/uL    RBC 5.77 (H) 4.23 - 5.6 M/uL    HGB 15.7 13.6 - 17.2 g/dL    HCT 48.5 41.1 - 50.3 %    MCV 84.1 79.6 - 97.8 FL    MCH 27.2 26.1 - 32.9 PG    MCHC 32.4 31.4 - 35.0 g/dL    RDW 13.0 11.9 - 14.6 %    PLATELET 237 150 - 450 K/uL    MPV 9.9 9.4 - 12.3 FL    ABSOLUTE NRBC 0.00 0.0 - 0.2 K/uL    DF AUTOMATED      NEUTROPHILS 81 (H) 43 - 78 %    LYMPHOCYTES 9 (L) 13 - 44 %    MONOCYTES 9 4.0 - 12.0 %    EOSINOPHILS 1 0.5 - 7.8 %    BASOPHILS 0 0.0 - 2.0 %    IMMATURE GRANULOCYTES 1 0.0 - 5.0 %    ABS. NEUTROPHILS 15.0 (H) 1.7 - 8.2 K/UL    ABS. LYMPHOCYTES 1.6 0.5 - 4.6 K/UL    ABS. MONOCYTES 1.6 (H) 0.1 - 1.3 K/UL    ABS. EOSINOPHILS 0.1 0.0 - 0.8 K/UL    ABS. BASOPHILS 0.0 0.0 - 0.2 K/UL    ABS. IMM. GRANS. 0.2 0.0 - 0.5 K/UL   HEPARIN XA UFH    Collection Time: 05/17/19  5:02 AM   Result Value Ref  Range    Heparin Xa UFH 0.5 0.3 - 0.7 IU/mL        Current Meds:  Current Facility-Administered Medications   Medication Dose Route Frequency   ??? perflutren lipid microspheres (DEFINITY) in NS bolus IV  1 mL IntraVENous PRN   ??? famotidine (PF) (PEPCID) 20 mg in 0.9% sodium chloride 10 mL injection  20 mg IntraVENous Q12H   ??? oxyCODONE-acetaminophen (PERCOCET) 5-325 mg per tablet 1 Tab  1 Tab Oral Q4H PRN   ??? oxyCODONE-acetaminophen (PERCOCET 10) 10-325 mg per tablet 1 Tab  1 Tab Oral Q4H PRN   ??? heparin 25,000 units in dextrose 500 mL infusion  18-36 Units/kg/hr IntraVENous TITRATE   ??? sodium chloride (NS) flush 5-40 mL  5-40 mL IntraVENous Q8H   ??? sodium chloride (NS) flush 5-40 mL  5-40 mL IntraVENous PRN    ??? acetaminophen (TYLENOL) tablet 650 mg  650 mg Oral Q6H PRN    Or   ??? acetaminophen (TYLENOL) suppository 650 mg  650 mg Rectal Q6H PRN   ??? polyethylene glycol (MIRALAX) packet 17 g  17 g Oral DAILY PRN   ??? ondansetron (ZOFRAN) injection 4 mg  4 mg IntraVENous Q6H PRN   ??? 0.9% sodium chloride infusion  75 mL/hr IntraVENous CONTINUOUS       Other Studies:  No results found for this visit on 05/15/19.    No results found.    All Micro Results     Procedure Component Value Units Date/Time    CULTURE, BLOOD [id number] Collected:  05/15/19 1527    Order Status:  Completed Specimen:  Blood Updated:  05/17/19 0920     Special Requests: --        NO SPECIAL REQUESTS  LEFT  FOREARM       Culture result: NO GROWTH 2 DAYS       CULTURE, BLOOD [id number] Collected:  05/15/19 1528    Order Status:  Completed Specimen:  Blood Updated:  05/17/19 0920     Special Requests: --        NO SPECIAL REQUESTS  LEFT  HAND       Culture result: NO GROWTH 2 DAYS             SARS-CoV-2 Lab Results  "Novel Coronavirus" Test: No results found for: COV2NT   "Emergent Disease" Test: No results found for: EDPR  "SARS-COV-2" Test: No results found for: XGCOVT  Rapid Test:   Lab Results   Component Value Date/Time    COVR Not detected 05/15/2019 03:27 PM            Assessment and Plan:     Hospital Problems as of 05/17/2019 Date Reviewed: 05-09-2019          Codes Class Noted - Resolved POA    Pulmonary emboli ([place]) ICD-10-CM: I26.99  ICD-9-CM: 415.19  05/15/2019 - Present Yes        Pancreatic mass ICD-10-CM: K86.89  ICD-9-CM: 577.8  05/15/2019 - Present Yes        Leukocytosis ICD-10-CM: D72.829  ICD-9-CM: 288.60  05/15/2019 - Present Yes        Polycythemia ICD-10-CM: D75.1  ICD-9-CM: 238.4  05/15/2019 - Present Yes        Hypertension ICD-10-CM: I10  ICD-9-CM: 401.9  Unknown - Present Yes              Plan:    ?? Bilateral PE:  ?? IV heparin drip  ?? ECHO pending  ?? O2  as needed  ??  ??  ?? Pancreatic tumor:  ?? Consulted GI, surgery and oncology   ?? Diet as tolerant  ?? Pending EGD/EUS 05-18-19  ?? Pending diagnostic lap, port and abdominal pleurex for 10-21.    ??  ??  ?? HTN:  ?? Holding lisinopril   ??  ??  ?? Polycythemia:  ?? Stable CBC  ??  ??  ?? Leukocytosis:  ?? negative UA  ?? followup CBC as may be stress /cancer reaction  ??  ??  ?? Elevated LFTS:  ?? Repeat lab stable  ??  Discharge planning:  pending  DVT ppx: IV heparin  Code status:  Full  Estimated LOS:  Greater than 2 midnights  Risk:  high  Care plan: daughter [phone]  Signed:  Orion Modest, MD

## 2019-05-17 NOTE — Other (Signed)
Bedside and Verbal shift change report given to Casilda Carls, RN and Joelene Millin, Therapist, sports (Soil scientist) by self Gerarda Gunther nurse). Report included the following information SBAR, Kardex, Intake/Output, MAR, and Recent Results.        Heparin gtt verified per MAR.

## 2019-05-17 NOTE — Progress Notes (Addendum)
[facility] Progress Note         Admit Date:  05/15/2019    Today's Date:  05/17/2019    CC:  Pancreatic tumor    Subjective:     Patient somnolent--just finished getting ECHO. Currently denies SOB. On 3 L O2. Currently denies abdominal pain and nausea, but then when asked about chest pain, he indicates he has been having some epigastric pain. Reports one episode of vomiting earlier this admission. Last BM was 2 days ago.     Medications:   Current Facility-Administered Medications   Medication Dose Route Frequency   ??? famotidine (PF) (PEPCID) 20 mg in 0.9% sodium chloride 10 mL injection  20 mg IntraVENous Q12H   ??? oxyCODONE-acetaminophen (PERCOCET) 5-325 mg per tablet 1 Tab  1 Tab Oral Q4H PRN   ??? oxyCODONE-acetaminophen (PERCOCET 10) 10-325 mg per tablet 1 Tab  1 Tab Oral Q4H PRN   ??? heparin 25,000 units in dextrose 500 mL infusion  18-36 Units/kg/hr IntraVENous TITRATE   ??? sodium chloride (NS) flush 5-40 mL  5-40 mL IntraVENous Q8H   ??? sodium chloride (NS) flush 5-40 mL  5-40 mL IntraVENous PRN   ??? acetaminophen (TYLENOL) tablet 650 mg  650 mg Oral Q6H PRN    Or   ??? acetaminophen (TYLENOL) suppository 650 mg  650 mg Rectal Q6H PRN   ??? polyethylene glycol (MIRALAX) packet 17 g  17 g Oral DAILY PRN   ??? ondansetron (ZOFRAN) injection 4 mg  4 mg IntraVENous Q6H PRN   ??? 0.9% sodium chloride infusion  75 mL/hr IntraVENous CONTINUOUS       Review of Systems:  ROS was obtained, with pertinent positives as listed above.  No SOB. Reports epigastric pain    Diet:  Full liquid    Objective:   Vitals:  Visit Vitals  BP 131/76   Pulse 85   Temp 97.8 ??F (36.6 ??C)   Resp 22   Ht 6' (1.829 m)   Wt 96 kg (211 lb 9.6 oz)   SpO2 98%   BMI 28.70 kg/m??     Intake/Output:  No intake/output data recorded.  10/17 1901 - 10/19 0700  In: 300 [P.O.:300]  Out: 300 [Urine:300]  Exam:  General appearance: somnolent but answers questions appropriately  Lungs: clear to auscultation bilaterally anteriorly; on O2 via NC at 3L   Heart: regular rate and rhythm  Abdomen: firm, moderately distendedbut nontender. Bowel sounds normal. No masses, no organomegaly  Extremities: extremities normal, atraumatic, no cyanosis or edema  Neuro:  somnolent    Data Review (Labs):    Recent Labs     05/17/19  0502 05/16/19  0104 05/15/19  2048 05/15/19  1416   WBC 18.5* 17.7* 18.4* 18.9*   HGB 15.7 15.6 16.9 17.7*   HCT 48.5 46.1 51.2* 54.6*   PLT 237 217 235 269   MCV 84.1 81.2 82.3 83.4   NA  --  137  --  134*   K  --  3.9  --  3.8   CL  --  103  --  98   CO2  --  25  --  28   BUN  --  12  --  11   CREA  --  0.69*  --  1.10   CA  --  8.4  --  9.3   GLU  --  133*  --  133*   AP  --  80  --  105  ALT  --  66*  --  97*   TBILI  --  0.7  --  0.9   CBIL  --  0.2  --   --    ALB  --  2.1*  --  2.9*   TP  --  5.6*  --  7.4   LPSE  --   --   --  42*   APTT  --   --  198.7*  --        Assessment:     Active Problems:    Hypertension ()      Pulmonary emboli (HCC) (05/15/2019)      Pancreatic mass (05/15/2019)      Leukocytosis (05/15/2019)      Polycythemia (05/15/2019)      78 y.o. male with PMH of HTN and lumbar laminectomy, who is admitted for bilateral PE (on heparin gtt) and seen by GI for one year hx of abdominal distention, anorexia, change in bowel habits, and pancreatic mass with peritoneal carcinomatosis on CT. CA19-9 elevated at 2900  Plan:   1) scheduled for EGD/EUS tomorrow, 05/18/19 at 1430 with Dr. Mare Ferrari. Will hold heparin gtt starting at 0830    Patient is seen and examined in collaboration with Dr. Billy Coast.  Assessment and plan as per Dr. Billy Coast.  Corrinne Eagle, PA    =Abdomen is distended and somewhat firm. NT + BS.   Has ascites on CT.Labs as above. Pancreatic mass to be further evaluated with EUS tomorrow.  Dr Sheria Lang also planning laparoscopy etc. Heparin to be held in am. Check coags.  Oncology following as well.    Mare Ferrari, MD

## 2019-05-17 NOTE — Progress Notes (Addendum)
Inpatient Hematology / Oncology Daily Progress Note    Reason for Consult:  Pulmonary emboli [facility]) [I26.99]  Referring Physician:  Orion Modest, MD    24 Hour Events:  Afebrile, VSS, on O2 @ 3L  Dr. Sheria Lang with plans for pancreatic mass bx, port, and abd pleurx  On heparin gtt for PEs  Wife at bedside      ROS:  Constitutional: Negative for fever, chills.  CV: Negative for chest pain, palpitations, edema.  Respiratory: +dypsnea  GI: +N/V    10 point review of systems is otherwise negative with the exception of the elements mentioned above in the HPI.       Allergies   Allergen Reactions   ??? Protonix [Pantoprazole] Nausea and Vomiting     Past Medical History:   Diagnosis Date   ??? Hypertension      Past Surgical History:   Procedure Laterality Date   ??? HX APPENDECTOMY  1956   ??? HX CATARACT REMOVAL Bilateral     right done 2018, left 2002   ??? HX LUMBAR LAMINECTOMY  06/09/2018    lami L2-3. L3-4 4-5 Dr. Bradd Canary   ??? HX TONSIL AND ADENOIDECTOMY       Family History   Problem Relation Age of Onset   ??? Heart Attack Father    ??? No Known Problems Sister    ??? No Known Problems Brother    ??? No Known Problems Maternal Grandmother    ??? No Known Problems Maternal Grandfather    ??? No Known Problems Paternal Grandmother    ??? No Known Problems Paternal Grandfather      Social History     Socioeconomic History   ??? Marital status: MARRIED     Spouse name: Not on file   ??? Number of children: Not on file   ??? Years of education: Not on file   ??? Highest education level: Not on file   Occupational History   ??? Not on file   Social Needs   ??? Financial resource strain: Not on file   ??? Food insecurity     Worry: Not on file     Inability: Not on file   ??? Transportation needs     Medical: Not on file     Non-medical: Not on file   Tobacco Use   ??? Smoking status: Never Smoker   ??? Smokeless tobacco: Never Used   Substance and Sexual Activity   ??? Alcohol use: Never     Frequency: Never   ??? Drug use: Never    ??? Sexual activity: Not Currently     Partners: Female   Lifestyle   ??? Physical activity     Days per week: Not on file     Minutes per session: Not on file   ??? Stress: Not on file   Relationships   ??? Social Product manager on phone: Not on file     Gets together: Not on file     Attends religious service: Not on file     Active member of club or organization: Not on file     Attends meetings of clubs or organizations: Not on file     Relationship status: Not on file   ??? Intimate partner violence     Fear of current or ex partner: Not on file     Emotionally abused: Not on file     Physically abused: Not on file  Forced sexual activity: Not on file   Other Topics Concern   ??? Not on file   Social History Narrative   ??? Not on file     Current Facility-Administered Medications   Medication Dose Route Frequency Provider Last Rate Last Dose   ??? famotidine (PF) (PEPCID) 20 mg in 0.9% sodium chloride 10 mL injection  20 mg IntraVENous Q12H Davis-Pachter, Lucy E, MD   20 mg at 05/17/19 0914   ??? oxyCODONE-acetaminophen (PERCOCET) 5-325 mg per tablet 1 Tab  1 Tab Oral Q4H PRN Margette Fast, MD   1 Tab at 05/17/19 0441   ??? oxyCODONE-acetaminophen (PERCOCET 10) 10-325 mg per tablet 1 Tab  1 Tab Oral Q4H PRN Margette Fast, MD   1 Tab at 05/16/19 2216   ??? heparin 25,000 units in dextrose 500 mL infusion  18-36 Units/kg/hr IntraVENous TITRATE Davis-Pachter, Nilsa Nutting, MD 34.3 mL/hr at 05/17/19 0706 18 Units/kg/hr at 05/17/19 0706   ??? sodium chloride (NS) flush 5-40 mL  5-40 mL IntraVENous Q8H Davis-Pachter, Lucy E, MD   10 mL at 05/16/19 2151   ??? sodium chloride (NS) flush 5-40 mL  5-40 mL IntraVENous PRN Davis-Pachter, Nilsa Nutting, MD       ??? acetaminophen (TYLENOL) tablet 650 mg  650 mg Oral Q6H PRN Davis-Pachter, Nilsa Nutting, MD   650 mg at 05/16/19 1445    Or   ??? acetaminophen (TYLENOL) suppository 650 mg  650 mg Rectal Q6H PRN Davis-Pachter, Nilsa Nutting, MD        ??? polyethylene glycol (MIRALAX) packet 17 g  17 g Oral DAILY PRN Davis-Pachter, Nilsa Nutting, MD       ??? ondansetron (ZOFRAN) injection 4 mg  4 mg IntraVENous Q6H PRN Davis-Pachter, Lucy E, MD   4 mg at 05/16/19 0818   ??? 0.9% sodium chloride infusion  75 mL/hr IntraVENous CONTINUOUS Davis-Pachter, Nilsa Nutting, MD 75 mL/hr at 05/16/19 2219 75 mL/hr at 05/16/19 2219       OBJECTIVE:  Patient Vitals for the past 8 hrs:   BP Temp Pulse Resp SpO2 Weight   05/17/19 0911 120/76 97.5 ??F (36.4 ??C) 80 18 92 % ???   05/17/19 0432 ??? ??? ??? ??? ??? 211 lb 9.6 oz (96 kg)   05/17/19 0430 131/76 97.8 ??F (36.6 ??C) 85 22 98 % ???     Temp (24hrs), Avg:97.9 ??F (36.6 ??C), Min:97.5 ??F (36.4 ??C), Max:98.2 ??F (36.8 ??C)    No intake/output data recorded.    Physical Exam:  Constitutional: Well developed, well nourished male in no acute distress, sitting comfortably in the bedside chair.   HEENT: Normocephalic and atraumatic. Oropharynx is clear, mucous membranes are moist.  Sclerae anicteric. Neck supple without JVD. No thyromegaly present.    Skin Warm and dry.  No bruising and no rash noted.  No erythema.  No pallor.    Respiratory Lungs are clear to auscultation bilaterally without wheezes, rales or rhonchi, normal air exchange without accessory muscle use.    CVS Normal rate, regular rhythm and normal S1 and S2.  No murmurs, gallops, or rubs.   Abdomen +distended Soft, nontender and, normoactive bowel sounds.  No palpable mass.  No hepatosplenomegaly.   Neuro Grossly nonfocal with no obvious sensory or motor deficits.   MSK Normal range of motion in general.  No edema and no tenderness.   Psych Appropriate mood and affect.        Labs:    Recent Results (from the  past 24 hour(s))   HEPARIN XA UFH    Collection Time: 05/16/19  3:43 PM   Result Value Ref Range    Heparin Xa UFH 0.48 0.3 - 0.7 IU/mL   CEA    Collection Time: 05/16/19  3:43 PM   Result Value Ref Range    CEA 2.5 0.0 - 3.0 ng/mL   CANCER AG 19-9    Collection Time: 05/16/19  3:43 PM    Result Value Ref Range    Cancer antigen 19-9 2,926.20 (H) 2.0 - 37.0 U/mL   PLEASE READ & DOCUMENT PPD TEST IN 24 HRS    Collection Time: 05/16/19  8:30 PM   Result Value Ref Range    PPD Negative Negative    mm Induration 0 0 - 5 mm   HEPARIN XA UFH    Collection Time: 05/16/19  9:37 PM   Result Value Ref Range    Heparin Xa UFH 0.54 0.3 - 0.7 IU/mL   URINALYSIS W/ RFLX MICROSCOPIC    Collection Time: 05/16/19 10:46 PM   Result Value Ref Range    Color ORANGE      Appearance CLEAR      Specific gravity 1.047 (H) 1.001 - 1.023      pH (UA) 5.5 5.0 - 9.0      Protein 30 (A) NEG mg/dL    Glucose Negative mg/dL    Ketone Negative NEG mg/dL    Bilirubin MODERATE (A) NEG      Blood Negative NEG      Urobilinogen 1.0 0.2 - 1.0 EU/dL    Nitrites Negative NEG      Leukocyte Esterase Negative NEG      WBC 0-3 0 /hpf    RBC 0-3 0 /hpf    Epithelial cells 0-3 0 /hpf    Bacteria 0 0 /hpf    Casts HYALINE 0 /lpf    Mucus 2+ (H) 0 /lpf    Other observations RESULTS VERIFIED MANUALLY     CBC WITH AUTOMATED DIFF    Collection Time: 05/17/19  5:02 AM   Result Value Ref Range    WBC 18.5 (H) 4.3 - 11.1 K/uL    RBC 5.77 (H) 4.23 - 5.6 M/uL    HGB 15.7 13.6 - 17.2 g/dL    HCT 48.5 41.1 - 50.3 %    MCV 84.1 79.6 - 97.8 FL    MCH 27.2 26.1 - 32.9 PG    MCHC 32.4 31.4 - 35.0 g/dL    RDW 13.0 11.9 - 14.6 %    PLATELET 237 150 - 450 K/uL    MPV 9.9 9.4 - 12.3 FL    ABSOLUTE NRBC 0.00 0.0 - 0.2 K/uL    DF AUTOMATED      NEUTROPHILS 81 (H) 43 - 78 %    LYMPHOCYTES 9 (L) 13 - 44 %    MONOCYTES 9 4.0 - 12.0 %    EOSINOPHILS 1 0.5 - 7.8 %    BASOPHILS 0 0.0 - 2.0 %    IMMATURE GRANULOCYTES 1 0.0 - 5.0 %    ABS. NEUTROPHILS 15.0 (H) 1.7 - 8.2 K/UL    ABS. LYMPHOCYTES 1.6 0.5 - 4.6 K/UL    ABS. MONOCYTES 1.6 (H) 0.1 - 1.3 K/UL    ABS. EOSINOPHILS 0.1 0.0 - 0.8 K/UL    ABS. BASOPHILS 0.0 0.0 - 0.2 K/UL    ABS. IMM. GRANS. 0.2 0.0 - 0.5 K/UL   HEPARIN XA UFH  Collection Time: 05/17/19  5:02 AM   Result Value Ref Range     Heparin Xa UFH 0.5 0.3 - 0.7 IU/mL       Imaging:  CT of the Chest, Abdomen, and Pelvis  ??  INDICATION: Shortness of breath and abdominal pain  ??  Multiple axial images were obtained through the chest, abdomen, and pelvis.   Oral contrast was used for bowel opacification.  130mL of Isovue 370 intravenous  contrast was used for better evaluation of solid organs and vascular structures.   Radiation dose reduction techniques were used for this study.  All CT scans  performed at this facility use one or all of the following: Automated exposure  control, adjustment of the mA and/or kVp according to patient's size, iterative  reconstruction.  ??  COMPARISON: None  ??  FINDINGS:  -LUNGS: Minimal bibasilar infiltrate/atelectasis, left greater than right.  Tiny  bilateral pleural effusions are also present.  No definite pulmonary mass.  -MEDIASTINUM/AXILLA: No significant adenopathy.  -HEART/VESSELS: There are multiple bilateral pulmonary emboli.  Mild  atherosclerosis.  Heart size is normal.  -CHEST WALL: Normal.  ??  -LIVER: Normal in size and appearance.    -GALLBLADDER/BILE DUCTS: Gallbladder is collapsed.  No bile duct dilatation.  -PANCREAS: There is a 2.2 cm mass in the body of the pancreas.  Masses  compressing the splenic vein.  Pancreatic duct is dilated.  -SPLEEN: Normal.  ??  -ADRENALS:  Normal.  -KIDNEYS/URETERS: 6.7 cm cyst in the lower pole the left kidney.  No  hydronephrosis or significant mass.  -BLADDER: Normal.  -REPRODUCTIVE ORGANS: No pelvic masses.  ??  -BOWEL: Mild distention of the transverse colon.  No inflammatory changes.  -LYMPH NODES: No significant retroperitoneal, mesenteric, or pelvic adenopathy.  -BONES: No fracture or significant bone lesion.  -VASCULATURE: Normal  -OTHER: There is peritoneal carcinomatosis with associated ascites.  Fluid is  most prominent in the upper left abdomen.    ??  IMPRESSION  IMPRESSION:   1.  Primary pancreas tumor.  2.  Peritoneal carcinomatosis and ascites.   3.  Multiple bilateral pulmonary emboli.  ??    ASSESSMENT:  Problem List  Date Reviewed: 05/15/19          Codes Class Noted    Pulmonary emboli [facility]) ICD-10-CM: I26.99  ICD-9-CM: 415.19  05/15/2019        Pancreatic mass ICD-10-CM: K86.89  ICD-9-CM: 577.8  05/15/2019        Leukocytosis ICD-10-CM: [id number]  ICD-9-CM: 288.60  05/15/2019        Polycythemia ICD-10-CM: D75.1  ICD-9-CM: 238.4  05/15/2019        S/P laminectomy ICD-10-CM: [id number]  ICD-9-CM: V45.89  06/11/2018        Spinal stenosis of lumbar region with neurogenic claudication ICD-10-CM: M48.062  ICD-9-CM: 724.03  06/09/2018        Hypertension ICD-10-CM: I10  ICD-9-CM: 401.9  Unknown            78 y.o. M H/O laminectomy developed abd distension, N/V/lose stools, then SOB and admitted from ER, CT showed panc body tumor, massive ascites, peritoneal disease, I reviewed CT personally and discussed with pt and and daughter this wold be most suspicious of pancreatic cancer with peritoneal metastasis, needs histology diagnosis, need therapeutic paracentesis, possible need for ascites drain till responding to chemo and most likely need port, large B/L PE on [facility], discussed with Dr. Sheria Lang, will follow.    RECOMMENDATIONS:  Pancreatic Mass  -check CEA and  Ca 19-9  -will need biopsy  -consulted Dr Sheria Lang for biopsy, staging, and port placement   10/19 CEA 2.5, CA 19-9 2926.  Dr. Sheria Lang with plans for bx, port, and abd pleurx.    B/L PEs  -Hep gtt  -BLE doppler negative for DVT  10/19 on heparin gtt.  Echo pending.    Leukocytosis  -WBC 17.7, down from 18.4 yesterday  -will monitor  10/19 WBC 18k    Elevated LFTs  -Bili normal 0.2  -ALT 66, down from 97  -AST 35, down from 50  -monitor     Supportive care per primary team     Thank you for allowing [place] to participate in the care of Mr. Matthew Sherman.  Our office will arrange f/u with Dr. Ronny Flurry upon discharge.        Marisa Hua, NP  [facility] [place] [address]  Office : [phone]   Fax : [phone]            Attending Addendum:  I have personally performed a face to face diagnostic evaluation on this patient. I have reviewed and agree with the care plan as documented by Marisa Hua, N.P. My findings are as follows:  He has pancreatic mass, appears anxious, heart rate regular without murmurs, abdomen is non-tender, bowel sounds are positive, we will follow-up the results of his cytology.              Floyde Parkins, MD      [facility] Hematology/Oncology  [address] [place] [address]  Office : [phone]  Fax : [phone]

## 2019-05-17 NOTE — Other (Signed)
Bedside and Verbal shift change report given to self (oncoming nurse) by  Casilda Carls, RN and Joelene Millin, RN (offgoing nurse). Report included the following information SBAR, Kardex, Intake/Output, MAR, and Recent Results.      Heparin gtt verified per MAR.

## 2019-05-17 NOTE — Progress Notes (Signed)
Care Management Interventions  PCP Verified by CM: Yes(Dr Pruit)  Last Visit to PCP: 04/28/19  Mode of Transport at Discharge: Other (see comment)(Haser, Lurlean Leyden  [phone]   [phone] )  Transition of Care Consult (CM Consult): Discharge Planning  Current Support Network: Lives with Spouse, Own Home  Confirm Follow Up Transport: Family  Discharge Location  Discharge Placement: Unable to determine at this time    Pt admitted to 3rd floor tele for SOB; pulmonary emboli and pancreatic tumor.. CM met with pt and his spouse to discuss CM needs & DCP. Pt is A&Ox4. Pt hd been indep at home with all ADLS until the "last few weeks'.; " since I've been sick". Pt lives with his spouse in a home with 2 step entrance. Pt does not use any DME at home but has RW, BSC from previous surgeries. . Pt has no difficulty with obtaining medications. Pt stopped driving in the last few weeks. DCP home with spouse. CM to continue to monitor results of upcoming procedures.

## 2019-05-18 LAB — CBC WITH AUTOMATED DIFF
ABS. BASOPHILS: 0.1 10*3/uL (ref 0.0–0.2)
ABS. EOSINOPHILS: 0.2 10*3/uL (ref 0.0–0.8)
ABS. IMM. GRANS.: 0.2 10*3/uL (ref 0.0–0.5)
ABS. LYMPHOCYTES: 1.2 10*3/uL (ref 0.5–4.6)
ABS. MONOCYTES: 1.2 10*3/uL (ref 0.1–1.3)
ABS. NEUTROPHILS: 10.8 10*3/uL — ABNORMAL HIGH (ref 1.7–8.2)
ABSOLUTE NRBC: 0 10*3/uL (ref 0.0–0.2)
BASOPHILS: 0 % (ref 0.0–2.0)
EOSINOPHILS: 1 % (ref 0.5–7.8)
HCT: 43.7 % (ref 41.1–50.3)
HGB: 14.5 g/dL (ref 13.6–17.2)
IMMATURE GRANULOCYTES: 1 % (ref 0.0–5.0)
LYMPHOCYTES: 9 % — ABNORMAL LOW (ref 13–44)
MCH: 27.5 PG (ref 26.1–32.9)
MCHC: 33.2 g/dL (ref 31.4–35.0)
MCV: 82.8 FL (ref 79.6–97.8)
MONOCYTES: 9 % (ref 4.0–12.0)
MPV: 9.5 FL (ref 9.4–12.3)
NEUTROPHILS: 79 % — ABNORMAL HIGH (ref 43–78)
PLATELET: 236 10*3/uL (ref 150–450)
RBC: 5.28 M/uL (ref 4.23–5.6)
RDW: 13.1 % (ref 11.9–14.6)
WBC: 13.6 10*3/uL — ABNORMAL HIGH (ref 4.3–11.1)

## 2019-05-18 LAB — METABOLIC PANEL, COMPREHENSIVE
A-G Ratio: 0.7 — ABNORMAL LOW (ref 1.2–3.5)
ALT (SGPT): 48 U/L (ref 12–65)
AST (SGOT): 23 U/L (ref 15–37)
Albumin: 2.1 g/dL — ABNORMAL LOW (ref 3.2–4.6)
Alk. phosphatase: 73 U/L (ref 50–136)
Anion gap: 5 mmol/L — ABNORMAL LOW (ref 7–16)
BUN: 20 MG/DL (ref 8–23)
Bilirubin, total: 0.4 MG/DL (ref 0.2–1.1)
CO2: 25 mmol/L (ref 21–32)
Calcium: 8.4 MG/DL (ref 8.3–10.4)
Chloride: 106 mmol/L (ref 98–107)
Creatinine: 0.82 MG/DL (ref 0.8–1.5)
GFR est AA: >60 mL/min/{1.73_m2} (ref >60)
GFR est non-AA: >60 mL/min/{1.73_m2} (ref >60)
Globulin: 3.1 g/dL (ref 2.3–3.5)
Glucose: 98 mg/dL (ref 65–100)
Potassium: 4 mmol/L (ref 3.5–5.1)
Protein, total: 5.2 g/dL — ABNORMAL LOW (ref 6.3–8.2)
Sodium: 136 mmol/L (ref 136–145)

## 2019-05-18 LAB — HEPARIN XA UFH: Heparin Xa UFH: 0.53 IU/mL (ref 0.3–0.7)

## 2019-05-18 LAB — PLEASE READ & DOCUMENT PPD TEST IN 48 HRS
PPD: NEGATIVE Negative
mm Induration: 0 mm (ref 0–5)

## 2019-05-18 LAB — PROTHROMBIN TIME + INR
INR: 1.3
Prothrombin time: 16.6 s — ABNORMAL HIGH (ref 12.0–14.7)

## 2019-05-18 MED ORDER — PROPOFOL 10 MG/ML IV EMUL
10 mg/mL | INTRAVENOUS | Status: DC | PRN
Start: 2019-05-18 — End: 2019-05-18
  Administered 2019-05-18 (×2): via INTRAVENOUS

## 2019-05-18 MED ORDER — LACTATED RINGERS IV
INTRAVENOUS | Status: DC
Start: 2019-05-18 — End: 2019-05-19
  Administered 2019-05-18: 19:00:00 via INTRAVENOUS

## 2019-05-18 MED ORDER — LEVOFLOXACIN IN D5W 500 MG/100 ML IV PIGGY BACK
500 mg/100 mL | Freq: Once | INTRAVENOUS | Status: DC
Start: 2019-05-18 — End: 2019-05-20

## 2019-05-18 MED ORDER — CIPROFLOXACIN 250 MG TAB
250 mg | Freq: Two times a day (BID) | ORAL | Status: AC
Start: 2019-05-18 — End: 2019-05-22
  Administered 2019-05-19 – 2019-05-22 (×5): via ORAL

## 2019-05-18 MED ORDER — CEFAZOLIN 2 GRAM/20 ML IN STERILE WATER INTRAVENOUS SYRINGE
2 gram/0 mL | INTRAVENOUS | Status: AC
Start: 2019-05-18 — End: 2019-05-18
  Administered 2019-05-19: 04:00:00 via INTRAVENOUS

## 2019-05-18 MED ORDER — LISINOPRIL 5 MG TAB
5 mg | Freq: Every day | ORAL | Status: DC
Start: 2019-05-18 — End: 2019-05-24
  Administered 2019-05-18 – 2019-05-20 (×3): via ORAL

## 2019-05-18 MED ORDER — LISINOPRIL 5 MG TAB
5 mg | Freq: Every day | ORAL | Status: DC
Start: 2019-05-18 — End: 2019-05-18

## 2019-05-18 MED ORDER — LIDOCAINE (PF) 20 MG/ML (2 %) IJ SOLN
20 mg/mL (2 %) | INTRAMUSCULAR | Status: DC | PRN
Start: 2019-05-18 — End: 2019-05-18
  Administered 2019-05-18: 19:00:00 via INTRAVENOUS

## 2019-05-18 MED FILL — LACTATED RINGERS IV: INTRAVENOUS | Qty: 1000

## 2019-05-18 MED FILL — LISINOPRIL 5 MG TAB: 5 mg | ORAL | Qty: 1

## 2019-05-18 MED FILL — OXYCODONE-ACETAMINOPHEN 5 MG-325 MG TAB: 5-325 mg | ORAL | Qty: 1

## 2019-05-18 MED FILL — HEPARIN (PORCINE) IN D5W 25,000 UNIT/500 ML IV: 25000 unit/500 mL (50 unit/mL) | INTRAVENOUS | Qty: 500

## 2019-05-18 MED FILL — FAMOTIDINE (PF) 20 MG/2 ML IV: 20 mg/2 mL | INTRAVENOUS | Qty: 2

## 2019-05-18 MED FILL — OXYCODONE-ACETAMINOPHEN 10 MG-325 MG TAB: 10-325 mg | ORAL | Qty: 1

## 2019-05-18 NOTE — Other (Signed)
Bedside and Verbal shift change report given to Casilda Carls, RN and Joelene Millin, Therapist, sports (Soil scientist) by self Gerarda Gunther nurse). Report included the following information SBAR, Kardex, Intake/Output, MAR, and Recent Results.

## 2019-05-18 NOTE — Other (Signed)
TRANSFER - OUT REPORT:    Verbal report given to Joelene Millin, RN(name) on Matthew Sherman  being transferred to 321(unit) for routine post - op       Report consisted of patient???s Situation, Background, Assessment and   Recommendations(SBAR).     Information from the following report(s) SBAR and Procedure Summary was reviewed with the receiving nurse.    Lines:   Peripheral IV 05/15/19 Right Antecubital (Active)   Site Assessment Clean, dry, & intact 05/18/19 1431   Phlebitis Assessment 0 05/18/19 1431   Infiltration Assessment 0 05/18/19 1431   Dressing Status Clean, dry, & intact 05/18/19 1431   Dressing Type Tape;Transparent 05/18/19 1431   Hub Color/Line Status Patent;Flushed;Infusing 05/18/19 1245   Alcohol Cap Used No 05/18/19 1245       Peripheral IV 05/18/19 Left Forearm (Active)   Site Assessment Clean, dry, & intact 05/18/19 1430   Phlebitis Assessment 0 05/18/19 1430   Infiltration Assessment 0 05/18/19 1430   Dressing Status Clean, dry, & intact 05/18/19 1430   Dressing Type Transparent;Tape 05/18/19 1430   Hub Color/Line Status Flushed;Patent 05/18/19 1430        Opportunity for questions and clarification was provided.      Patient transported with:   O2 @ 5 liters

## 2019-05-18 NOTE — Procedures (Signed)
ENDOSCOPIC ULTRASOUND PROCEDURE NOTE    DATE OF PROCEDURE: 05/18/2019    PRE-OP DIAGNOSIS:  Pancreatic mass  Ascites   Epigastric abdominal pain     POST-OP DIAGNOSIS:  Pancreatic mass, T2N0   Large ascites, possible carcinomatosis     MEDICATIONS ADMINISTERED: MAC    INSTRUMENT: GFUC T180    PROCEDURE:  After obtaining informed consent, the patient was placed in the left lateral position and sedated.  The echoendoscope was advanced to the upper GI tract without difficulty.  The scope was slowly removed while detailed images of the adjacent organs were obtained. The patient was taken to the recovery area in stable condition.    FINDINGS:  ESOPHAGUS: Not well visualized   STOMACH: Limited views with the oblique-viewing echoendoscope were unremarkable  DUODENUM: Limited views with the oblique-viewing echoendoscope were unremarkable.   PANCREAS: The pancreas was well visualized from the head to the tail.  The ampulla appears normal endosonographically.  There is a mass in the body of the pancreas. It is solid, 22 x 28 mm. There is compression of the splenic artery and vein. The mass abuts the portal splenic confluence 4 less than 1 cm. Directly adjacent to this mass is a large complex cystic and solid lesion. This suggests the lesion may have began as a mucinous cystic neoplasm. This component measures 35 x 25 mm. This could also represent a possible pseudocyst. There is compression of the main pancreatic duct in the body. FNA was performed from a transgastric approach with a Chemical engineer 22-gauge needle. 3 passes were made and tissue obtained in formalin.   BILIARY SYSTEM:  The gallbladder is small, a few small mobile stones are visualized. Bile duct is normal caliber, without stones.   OTHER ORGANS: There was A large amount of ascites in the upper abdomen.  Limited views of the left lobe of the liver demonstrated no solid mass lesions.  The celiac axis Is poorly visualized. There appears to be some  haziness around the artery, and it is in close proximity to the pancreatic mass.   No significant celiac or peri-pancreatic lymphadenopathy.  FNA was performed to drain ascites from the upper abdomen and sent for cytology. Using a separate [employer] 22-gauge acquire needle, 10 cc of yellow clear fluid were aspirated.  Prophylactic antibiotics were given.    Estimated blood loss: 0-minimal     PLAN:  Follow up cytology and pathology results   Hold heparin for 6 hours   Resume clears tonight, advance tomorrow as tolerated.  Further recommendations will be provided by the inpatient rounding team     Lorie Apley, MD  [facility]

## 2019-05-18 NOTE — Progress Notes (Signed)
Problem: Falls - Risk of  Goal: *Absence of Falls  Description: Document Matthew Sherman Fall Risk and appropriate interventions in the flowsheet.  Outcome: Progressing Towards Goal  Note: Fall Risk Interventions:  Mobility Interventions: Communicate number of staff needed for ambulation/transfer, Patient to call before getting OOB, PT Consult for mobility concerns, OT consult for ADLs         Medication Interventions: Patient to call before getting OOB, Teach patient to arise slowly, Evaluate medications/consider consulting pharmacy         History of Falls Interventions: Vital signs minimum Q4HRs X 24 hrs (comment for end date), Room close to nurse's station, Investigate reason for fall, Evaluate medications/consider consulting pharmacy

## 2019-05-18 NOTE — Other (Signed)
Bedside report received from [place], RN. Opportunity for questions, concerns. Patient involved.

## 2019-05-18 NOTE — Rehab Care Plan (Signed)
PT note:  Treatment deferred this date per RN request as the patient as going off the floor to a procedure soon.  Will continue PT effort.  Carla Drape, PTA

## 2019-05-18 NOTE — Anesthesia Pre-Procedure Evaluation (Addendum)
Relevant Problems   No relevant active problems       Anesthetic History               Review of Systems / Medical History  Patient summary reviewed and pertinent labs reviewed    Pulmonary          Shortness of breath (Recent bilaterla PE's-on heparin infusion)         Neuro/Psych              Cardiovascular    Hypertension              Exercise tolerance: >4 METS  Comments: TTE 05/17/2019: LVEF 50-55%   GI/Hepatic/Renal                Endo/Other        Cancer (Pancreatic with carcinomatosis)     Other Findings          Anesthetic History   No history of anesthetic complications            Review of Systems / Medical History  Patient summary reviewed and pertinent labs reviewed    Pulmonary          Shortness of breath      Comments: Bilateral PEs. On Heparin gtt   Neuro/Psych   Within defined limits           Cardiovascular    Hypertension                Anesthetic History   No history of anesthetic complications            Review of Systems / Medical History  Patient summary reviewed and pertinent labs reviewed    Pulmonary          Shortness of breath      Comments: Bilateral PEs. On Heparin gtt   Neuro/Psych   Within defined limits           Cardiovascular    Hypertension              Exercise tolerance: >4 METS  Comments: Echo yesterday showing LVEF 50-55%.   GI/Hepatic/Renal  Within defined limits              Endo/Other        Cancer (Pancreatic mass and peritoneal carcinomatosis)     Other Findings     Anesthetic History   No history of anesthetic complications            Review of Systems / Medical History  Patient summary reviewed and pertinent labs reviewed    Pulmonary          Shortness of breath      Comments: Bilateral PEs. On Heparin gtt   Neuro/Psych   Within defined limits           Cardiovascular    Hypertension              Exercise tolerance: >4 METS  Comments: Echo yesterday showing LVEF 50-55%.   GI/Hepatic/Renal  Within defined limits              Endo/Other         Cancer (Pancreatic mass and peritoneal carcinomatosis)     Other Findings              Physical Exam    Airway  Mallampati: II  TM Distance: > 6 cm  Neck ROM: normal range of motion   Mouth opening: Normal  Cardiovascular  Regular rate and rhythm,  S1 and S2 normal,  no murmur, click, rub, or gallop             Dental    Dentition: Full upper dentures and Edentulous     Pulmonary  Breath sounds clear to auscultation               Abdominal    Distended     Other Findings            Anesthetic Plan    ASA: 3  Anesthesia type: total IV anesthesia          Induction: Intravenous  Anesthetic plan and risks discussed with: Patient                     Physical Exam    Airway  Mallampati: II  TM Distance: > 6 cm  Neck ROM: normal range of motion   Mouth opening: Normal     Cardiovascular  Regular rate and rhythm,  S1 and S2 normal,  no murmur, click, rub, or gallop             Dental    Dentition: Full upper dentures and Edentulous     Pulmonary  Breath sounds clear to auscultation               Abdominal    Distended     Other Findings            Anesthetic Plan    ASA: 3  Anesthesia type: total IV anesthesia          Induction: Intravenous  Anesthetic plan and risks discussed with: Patient          Exercise tolerance: >4 METS  Comments: Echo yesterday showing LVEF 50-55%.   GI/Hepatic/Renal  Within defined limits              Endo/Other        Cancer (Pancreatic mass and peritoneal carcinomatosis)     Other Findings              Physical Exam    Airway  Mallampati: II  TM Distance: > 6 cm  Neck ROM: normal range of motion   Mouth opening: Normal     Cardiovascular  Regular rate and rhythm,  S1 and S2 normal,  no murmur, click, rub, or gallop             Dental    Dentition: Full upper dentures and Edentulous     Pulmonary  Breath sounds clear to auscultation               Abdominal    Distended     Other Findings            Anesthetic Plan    ASA: 3  Anesthesia type: total IV anesthesia           Induction: Intravenous  Anesthetic plan and risks discussed with: Patient              Physical Exam    Airway  Mallampati: II    Neck ROM: decreased range of motion   Mouth opening: Normal     Cardiovascular  Regular rate and rhythm,  S1 and S2 normal,  no murmur, click, rub, or gallop             Dental  No notable dental hx       Pulmonary  Breath sounds clear  to auscultation               Abdominal         Other Findings            Anesthetic Plan    ASA: 3  Anesthesia type: general        Post procedure ventilation   Induction: RSI  Anesthetic plan and risks discussed with: Patient

## 2019-05-18 NOTE — Progress Notes (Signed)
Patient arrived to GI prep in stable condition.

## 2019-05-18 NOTE — Other (Signed)
TRANSFER - IN REPORT:    Verbal report received from [place], Therapist, sports (name) on Zahi Conteh  being received from [facility](unit) for routine progression of care      Report consisted of patient???s Situation, Background, Assessment and   Recommendations(SBAR).     Information from the following report(s) SBAR was reviewed with the receiving nurse.    Opportunity for questions and clarification was provided.      Assessment completed upon patient???s arrival to unit and care assumed.       No new skin breakdown noted.

## 2019-05-18 NOTE — ACP (Advance Care Planning) (Signed)
CM met with pt this AM to discuss pt's testing today and ACP. Pt has a new diagnosis this admission of pancreatic tumor and multiple pulmonary emboli.   CM asked if pt's spouse is primary decision maker for ACP, pt verified she is.   Pt deferred drafting a HCPOA document until he discusses with his spouse.  1330 Pt out of room in procedure. Spouse in waiting room with pt.

## 2019-05-18 NOTE — Interval H&P Note (Signed)
Update History & Physical    The Patient's History and Physical attached below was reviewed with the patient and I examined the patient.  There was no change in the plan, or pertinent findings.  The surgical site was confirmed with the patient.    Plan:  The risk, benefits, expected outcome, and alternative to the recommended procedure have been discussed with the patient.  Patient understands and wants to proceed with the procedure.    Electronically signed by [place] Voytko Paul Yadiel Aubry, MD

## 2019-05-18 NOTE — Anesthesia Post-Procedure Evaluation (Signed)
Procedure(s):  ENDOSCOPIC ULTRASOUND (EUS)/ BMI 29 ROOM 321  FINE NEEDLE ASPIRATION.    total IV anesthesia    Anesthesia Post Evaluation      Multimodal analgesia: multimodal analgesia used between 6 hours prior to anesthesia start to PACU discharge  Patient location during evaluation: PACU  Patient participation: complete - patient participated  Level of consciousness: awake and alert  Pain management: adequate  Airway patency: patent  Anesthetic complications: no  Cardiovascular status: acceptable  Respiratory status: acceptable  Hydration status: acceptable  Post anesthesia nausea and vomiting:  none      INITIAL Post-op Vital signs:   Vitals Value Taken Time   BP 137/71 05/18/2019  4:45 PM   Temp 36.8 ??C (98.2 ??F) 05/18/2019  4:45 PM   Pulse 86 05/18/2019  4:45 PM   Resp 20 05/18/2019  4:45 PM   SpO2 97 % 05/18/2019  4:45 PM

## 2019-05-18 NOTE — Progress Notes (Signed)
Progress Note    Patient: Matthew Sherman MRN: [id number]  SSN: [id number]    Date of Birth: Nov 07, 1940  Age: 78 y.o.  Sex: male      Admit Date: 05/15/2019    LOS: 3 days     Subjective:   F/U pancreatic tumor    "78 yo male with PMH of HTN and Lumbar laminectomy who is evaluated with progressive shortness of breath and found with bilateral PE and pancreatitic tumor.  CT Chest/ AP done in the ED and shows primary pancreas tumor, peritoneal carcinomatosis and bilateral PE. He has been placed on IV heparin. Duplex LE no DVT. GI, surgery Dr. Sheria Lang and oncology consulted, pending EGD and EUS with FNA. Also planning diagnostic lap, port and abdominal pleurex for 10-21." CEA 2.5. Caner antigen 19-9 2,926.2. ECHO with EF 55%, wall thickness mildly increased.     To have EGD and EUS with biopsy today.     No chest pain or SOB. occasional abdominal pain at upper quadrants.   Current Facility-Administered Medications   Medication Dose Route Frequency   ??? [START ON 05/19/2019] ceFAZolin (ANCEF) 2 g/20 mL in sterile water IV syringe  2 g IntraVENous ON CALL TO OR   ??? prucalopride (MOTEGRITY) tab 2 mg (Patient Supplied)  2 mg Oral DAILY   ??? famotidine (PF) (PEPCID) 20 mg in 0.9% sodium chloride 10 mL injection  20 mg IntraVENous Q12H   ??? oxyCODONE-acetaminophen (PERCOCET) 5-325 mg per tablet 1 Tab  1 Tab Oral Q4H PRN   ??? oxyCODONE-acetaminophen (PERCOCET 10) 10-325 mg per tablet 1 Tab  1 Tab Oral Q4H PRN   ??? heparin 25,000 units in dextrose 500 mL infusion  18-36 Units/kg/hr IntraVENous TITRATE   ??? sodium chloride (NS) flush 5-40 mL  5-40 mL IntraVENous Q8H   ??? sodium chloride (NS) flush 5-40 mL  5-40 mL IntraVENous PRN   ??? acetaminophen (TYLENOL) tablet 650 mg  650 mg Oral Q6H PRN    Or   ??? acetaminophen (TYLENOL) suppository 650 mg  650 mg Rectal Q6H PRN   ??? polyethylene glycol (MIRALAX) packet 17 g  17 g Oral DAILY PRN   ??? ondansetron (ZOFRAN) injection 4 mg  4 mg IntraVENous Q6H PRN    ??? 0.9% sodium chloride infusion  75 mL/hr IntraVENous CONTINUOUS       Objective:     Vitals:    05/17/19 2102 05/18/19 0119 05/18/19 0530 05/18/19 0845   BP: 136/78 124/84 (!) 140/73 (!) 140/74   Pulse: 77 83 77 85   Resp: '18 20 20 20   ' Temp: 97.8 ??F (36.6 ??C) 98.2 ??F (36.8 ??C) 97.9 ??F (36.6 ??C) 97.8 ??F (36.6 ??C)   SpO2: 93% 97% 96% 98%   Weight:   97.5 kg (214 lb 14.4 oz)    Height:             Intake and Output:  Current Shift: 10/20 0701 - 10/20 1900  In: -   Out: 50 [Urine:50]  Last three shifts: 10/18 1901 - 10/20 0700  In: 240 [P.O.:240]  Out: 500 [Urine:500]    Physical Exam:   General:  Alert, cooperative, no distress, frail appearing.    Eyes:  Conjunctivae/corneas clear.    Ears:  Normal TMs and external ear canals both ears.   Nose: Nares normal. Septum midline.   Mouth/Throat: Lips, mucosa, and tongue normal.    Neck:  no JVD.   Back:   deferred   Lungs:  Clear to auscultation bilaterally.   Heart:  Regular rate and rhythm, S1, S2 normal   Abdomen:   Distended, mild tenderness to palpation at upper quadrants without rebound. +BS.    Extremities: Extremities normal, atraumatic, no cyanosis or edema.   Pulses: 2+ and symmetric all extremities.   Skin: Skin color, texture, turgor normal. No rashes or lesions   Lymph nodes: Cervical, supraclavicular, and axillary nodes normal.   Neurologic: CNII-XII intact.       Lab/Data Review:    Recent Results (from the past 24 hour(s))   PLEASE READ & DOCUMENT PPD TEST IN 48 HRS    Collection Time: 05/17/19  8:30 PM   Result Value Ref Range    PPD Negative Negative    mm Induration 0 0 - 5 mm   CBC WITH AUTOMATED DIFF    Collection Time: 05/18/19  3:32 AM   Result Value Ref Range    WBC 13.6 (H) 4.3 - 11.1 K/uL    RBC 5.28 4.23 - 5.6 M/uL    HGB 14.5 13.6 - 17.2 g/dL    HCT 43.7 41.1 - 50.3 %    MCV 82.8 79.6 - 97.8 FL    MCH 27.5 26.1 - 32.9 PG    MCHC 33.2 31.4 - 35.0 g/dL    RDW 13.1 11.9 - 14.6 %    PLATELET 236 150 - 450 K/uL    MPV 9.5 9.4 - 12.3 FL     ABSOLUTE NRBC 0.00 0.0 - 0.2 K/uL    DF AUTOMATED      NEUTROPHILS 79 (H) 43 - 78 %    LYMPHOCYTES 9 (L) 13 - 44 %    MONOCYTES 9 4.0 - 12.0 %    EOSINOPHILS 1 0.5 - 7.8 %    BASOPHILS 0 0.0 - 2.0 %    IMMATURE GRANULOCYTES 1 0.0 - 5.0 %    ABS. NEUTROPHILS 10.8 (H) 1.7 - 8.2 K/UL    ABS. LYMPHOCYTES 1.2 0.5 - 4.6 K/UL    ABS. MONOCYTES 1.2 0.1 - 1.3 K/UL    ABS. EOSINOPHILS 0.2 0.0 - 0.8 K/UL    ABS. BASOPHILS 0.1 0.0 - 0.2 K/UL    ABS. IMM. GRANS. 0.2 0.0 - 0.5 K/UL   METABOLIC PANEL, COMPREHENSIVE    Collection Time: 05/18/19  3:32 AM   Result Value Ref Range    Sodium 136 136 - 145 mmol/L    Potassium 4.0 3.5 - 5.1 mmol/L    Chloride 106 98 - 107 mmol/L    CO2 25 21 - 32 mmol/L    Anion gap 5 (L) 7 - 16 mmol/L    Glucose 98 65 - 100 mg/dL    BUN 20 8 - 23 MG/DL    Creatinine 0.82 0.8 - 1.5 MG/DL    GFR est AA >60 >60 ml/min/1.65m    GFR est non-AA >60 >60 ml/min/1.767m   Calcium 8.4 8.3 - 10.4 MG/DL    Bilirubin, total 0.4 0.2 - 1.1 MG/DL    ALT (SGPT) 48 12 - 65 U/L    AST (SGOT) 23 15 - 37 U/L    Alk. phosphatase 73 50 - 136 U/L    Protein, total 5.2 (L) 6.3 - 8.2 g/dL    Albumin 2.1 (L) 3.2 - 4.6 g/dL    Globulin 3.1 2.3 - 3.5 g/dL    A-G Ratio 0.7 (L) 1.2 - 3.5     HEPARIN XA UFH    Collection Time: 05/18/19  3:32 AM   Result Value Ref Range    Heparin Xa UFH 0.53 0.3 - 0.7 IU/mL   PROTHROMBIN TIME + INR    Collection Time: 05/18/19  3:32 AM   Result Value Ref Range    Prothrombin time 16.6 (H) 12.0 - 14.7 sec    INR 1.3         Assessment/ Plan:     Active Problems:    Hypertension ()      Pulmonary emboli ([place]) (05/15/2019)      Pancreatic mass (05/15/2019)      Leukocytosis (05/15/2019)      Polycythemia (05/15/2019)    Pancreatic mass- To have EGD/EUS with biopsy today. Plan for abdominal pleurX catheter Wednesday or Thursday. Once biopsy results are back is hematology wanting to start treatment before discharge?    PE - heparin drip until after all procedures performed.     Constipation-  Motegrity      HTN- add back low dose ACE    DVT prophylaxis - heparin drip   Signed By: Scherrie Merritts, DO     May 18, 2019

## 2019-05-18 NOTE — Other (Signed)
TRANSFER - OUT REPORT:    Verbal report given to [place], RN(name) on Matthew Sherman  being transferred to GI prep(unit) for ordered procedure       Report consisted of patient???s Situation, Background, Assessment and   Recommendations(SBAR).     Information from the following report(s) SBAR, Kardex and Cardiac Rhythm NSR was reviewed with the receiving nurse.    Lines:   Peripheral IV 05/15/19 Right Antecubital (Active)   Site Assessment Clean, dry, & intact 05/18/19 1431   Phlebitis Assessment 0 05/18/19 1431   Infiltration Assessment 0 05/18/19 1431   Dressing Status Clean, dry, & intact 05/18/19 1431   Dressing Type Tape;Transparent 05/18/19 1431   Hub Color/Line Status Patent;Flushed;Infusing 05/18/19 1245   Alcohol Cap Used No 05/18/19 1245       Peripheral IV 05/18/19 Left Forearm (Active)   Site Assessment Clean, dry, & intact 05/18/19 1430   Phlebitis Assessment 0 05/18/19 1430   Infiltration Assessment 0 05/18/19 1430   Dressing Status Clean, dry, & intact 05/18/19 1430   Dressing Type Transparent;Tape 05/18/19 1430   Hub Color/Line Status Flushed;Patent 05/18/19 1430        Opportunity for questions and clarification was provided.      Patient transported with:  O2 @ 2 liters

## 2019-05-18 NOTE — Other (Signed)
Bedside and Verbal shift change report given to myself (oncoming nurse) by Matias, RN (offgoing nurse). Report included the following information SBAR, Kardex, MAR and Recent Results.

## 2019-05-18 NOTE — Anesthesia Pre-Procedure Evaluation (Signed)
Anesthetic History   No history of anesthetic complications            Review of Systems / Medical History  Patient summary reviewed and pertinent labs reviewed    Pulmonary          Shortness of breath      Comments: Bilateral PEs. On Heparin gtt   Neuro/Psych   Within defined limits           Cardiovascular    Hypertension              Exercise tolerance: >4 METS  Comments: Echo yesterday showing LVEF 50-55%.   GI/Hepatic/Renal  Within defined limits              Endo/Other        Cancer (Pancreatic mass and peritoneal carcinomatosis)     Other Findings              Physical Exam    Airway  Mallampati: II  TM Distance: > 6 cm  Neck ROM: normal range of motion   Mouth opening: Normal     Cardiovascular  Regular rate and rhythm,  S1 and S2 normal,  no murmur, click, rub, or gallop             Dental    Dentition: Full upper dentures and Edentulous     Pulmonary  Breath sounds clear to auscultation               Abdominal    Distended     Other Findings            Anesthetic Plan    ASA: 3  Anesthesia type: total IV anesthesia          Induction: Intravenous  Anesthetic plan and risks discussed with: Patient

## 2019-05-18 NOTE — Progress Notes (Signed)
H&P/Consult Note/Progress Note/Office Note:   Matthew Sherman  MRN: [id number]  DOB:Feb 15, 1941  Age:78 y.o.     General Surgery Consult ordered by: Delbert Phenix, NP  Reason for General Surgery Consult: Pancreatic Mass    HPI: Matthew Sherman is a 78 y.o. male with PMH of HTN, who is seen in consultation at the request of Delbert Phenix, NP for pancreatic mass noted on 05/15/19 CT. Pt was admitted 10/17 for progressive SOB. Pt reports he has been experiencing intermittent worsening abdominal pain, distention, and anorexia over the past year.   Pt also c/o N/V this am.     05/15/19 CT Chest/Abd/Pelvis  IMPRESSION:   1.  Primary pancreas tumor.  2.  Peritoneal carcinomatosis and ascites.  3.  Multiple bilateral pulmonary emboli.    05/17/2019  Pt denies abdominal pain this AM.  Appears SOB.     05/18/19 In bed resting comfortably. No complaints. Will plan surgery tomorrow.       Past Medical History:   Diagnosis Date   ??? Hypertension      Past Surgical History:   Procedure Laterality Date   ??? HX APPENDECTOMY  1956   ??? HX CATARACT REMOVAL Bilateral     right done 2018, left 2002   ??? HX LUMBAR LAMINECTOMY  06/09/2018    lami L2-3. L3-4 4-5 Dr. Bradd Canary   ??? HX TONSIL AND ADENOIDECTOMY       Current Facility-Administered Medications   Medication Dose Route Frequency   ??? [START ON 05/19/2019] ceFAZolin (ANCEF) 2 g/20 mL in sterile water IV syringe  2 g IntraVENous ON CALL TO OR   ??? prucalopride (MOTEGRITY) tab 2 mg (Patient Supplied)  2 mg Oral DAILY   ??? famotidine (PF) (PEPCID) 20 mg in 0.9% sodium chloride 10 mL injection  20 mg IntraVENous Q12H   ??? oxyCODONE-acetaminophen (PERCOCET) 5-325 mg per tablet 1 Tab  1 Tab Oral Q4H PRN   ??? oxyCODONE-acetaminophen (PERCOCET 10) 10-325 mg per tablet 1 Tab  1 Tab Oral Q4H PRN   ??? heparin 25,000 units in dextrose 500 mL infusion  18-36 Units/kg/hr IntraVENous TITRATE   ??? sodium chloride (NS) flush 5-40 mL  5-40 mL IntraVENous Q8H    ??? sodium chloride (NS) flush 5-40 mL  5-40 mL IntraVENous PRN   ??? acetaminophen (TYLENOL) tablet 650 mg  650 mg Oral Q6H PRN    Or   ??? acetaminophen (TYLENOL) suppository 650 mg  650 mg Rectal Q6H PRN   ??? polyethylene glycol (MIRALAX) packet 17 g  17 g Oral DAILY PRN   ??? ondansetron (ZOFRAN) injection 4 mg  4 mg IntraVENous Q6H PRN   ??? 0.9% sodium chloride infusion  75 mL/hr IntraVENous CONTINUOUS     Protonix [pantoprazole]  Social History     Socioeconomic History   ??? Marital status: MARRIED     Spouse name: Not on file   ??? Number of children: Not on file   ??? Years of education: Not on file   ??? Highest education level: Not on file   Tobacco Use   ??? Smoking status: Never Smoker   ??? Smokeless tobacco: Never Used   Substance and Sexual Activity   ??? Alcohol use: Never     Frequency: Never   ??? Drug use: Never   ??? Sexual activity: Not Currently     Partners: Female     Social History     Tobacco Use   Smoking Status Never Smoker  Smokeless Tobacco Never Used     Family History   Problem Relation Age of Onset   ??? Heart Attack Father    ??? No Known Problems Sister    ??? No Known Problems Brother    ??? No Known Problems Maternal Grandmother    ??? No Known Problems Maternal Grandfather    ??? No Known Problems Paternal Grandmother    ??? No Known Problems Paternal Grandfather      ROS: Comprehensive review of systems was otherwise unremarkable except as noted above. + SOB    Physical Exam:   Visit Vitals  BP (!) 140/74   Pulse 85   Temp 97.8 ??F (36.6 ??C)   Resp 20   Ht 6' (1.829 m)   Wt 214 lb 14.4 oz (97.5 kg)   SpO2 98%   BMI 29.15 kg/m??     Vitals:    05/17/19 2102 05/18/19 0119 05/18/19 0530 05/18/19 0845   BP: 136/78 124/84 (!) 140/73 (!) 140/74   Pulse: 77 83 77 85   Resp: '18 20 20 20   ' Temp: 97.8 ??F (36.6 ??C) 98.2 ??F (36.8 ??C) 97.9 ??F (36.6 ??C) 97.8 ??F (36.6 ??C)   SpO2: 93% 97% 96% 98%   Weight:   214 lb 14.4 oz (97.5 kg)    Height:         No intake/output data recorded.  10/18 1901 - 10/20 0700  In: 240 [P.O.:240]   Out: 500 [Urine:500]    Constitutional: Alert, oriented, cooperative   Eyes:Sclera are clear. EOMs intact  ENMT: no external lesions gross hearing normal; no obvious neck masses, no ear or lip lesions, nares normal  CV: RRR. Normal perfusion  Resp: No JVD.  Breathing is slightly labored; no audible wheezing.    GI:  soft, distended,+ ascites; minimal generalized ttp; hypoactive BS  Musculoskeletal: No edema or cyanosis.   Neuro: no focal deficits  Psychiatric: normal affect and mood, no memory impairment    Recent vitals (if inpt):  Patient Vitals for the past 24 hrs:   BP Temp Pulse Resp SpO2 Weight   05/18/19 0845 (!) 140/74 97.8 ??F (36.6 ??C) 85 20 98 % ???   05/18/19 0530 (!) 140/73 97.9 ??F (36.6 ??C) 77 20 96 % 214 lb 14.4 oz (97.5 kg)   05/18/19 0119 124/84 98.2 ??F (36.8 ??C) 83 20 97 % ???   05/17/19 2102 136/78 97.8 ??F (36.6 ??C) 77 18 93 % ???   05/17/19 1631 122/80 97.6 ??F (36.4 ??C) 80 18 97 % ???   05/17/19 1248 120/78 97.8 ??F (36.6 ??C) 84 18 92 % ???   05/17/19 1047 ??? ??? ??? ??? 95 % ???   05/17/19 1018 ??? ??? ??? ??? 95 % ???       Labs:  Recent Labs     05/18/19  0332  05/16/19  0104 05/15/19  2048 05/15/19  1416   WBC 13.6*   < > 17.7* 18.4* 18.9*   HGB 14.5   < > 15.6 16.9 17.7*   PLT 236   < > 217 235 269   NA 136  --  137  --  134*   K 4.0  --  3.9  --  3.8   CL 106  --  103  --  98   CO2 25  --  25  --  28   BUN 20  --  12  --  11   CREA 0.82  --  0.69*  --  1.10   GLU 98  --  133*  --  133*   PTP 16.6*  --   --   --   --    INR 1.3  --   --   --   --    APTT  --   --   --  198.7*  --    TBILI 0.4  --  0.7  --  0.9   CBIL  --   --  0.2  --   --    ALT 48  --  66*  --  97*   AP 73  --  80  --  105   LPSE  --   --   --   --  42*    < > = values in this interval not displayed.       Lab Results   Component Value Date/Time    WBC 13.6 (H) 05/18/2019 03:32 AM    HGB 14.5 05/18/2019 03:32 AM    PLATELET 236 05/18/2019 03:32 AM    Sodium 136 05/18/2019 03:32 AM    Potassium 4.0 05/18/2019 03:32 AM     Chloride 106 05/18/2019 03:32 AM    CO2 25 05/18/2019 03:32 AM    BUN 20 05/18/2019 03:32 AM    Creatinine 0.82 05/18/2019 03:32 AM    Glucose 98 05/18/2019 03:32 AM    INR 1.3 05/18/2019 03:32 AM    aPTT 198.7 (HH) 05/15/2019 08:48 PM    Bilirubin, total 0.4 05/18/2019 03:32 AM    Bilirubin, direct 0.2 05/16/2019 01:04 AM    ALT (SGPT) 48 05/18/2019 03:32 AM    Alk. phosphatase 73 05/18/2019 03:32 AM    Lipase 42 (L) 05/15/2019 02:16 PM       CT Results  (Last 48 hours)    None        chest X-ray      I reviewed recent labs, recent radiologic studies, and pertinent records including other doctor notes if needed.    I independently reviewed radiology images for studies I described above or studies I have ordered.   Admission date (for inpatients): 05/15/2019   * No surgery found *  Procedure(s):  ENDOSCOPIC ULTRASOUND (EUS)/ BMI 29 ROOM 321    ASSESSMENT/PLAN:  Problem List  Date Reviewed: 2019/05/15          Codes Class Noted    Pulmonary emboli [facility]) ICD-10-CM: I26.99  ICD-9-CM: 415.19  05/15/2019        Pancreatic mass ICD-10-CM: K86.89  ICD-9-CM: 577.8  05/15/2019        Leukocytosis ICD-10-CM: D72.829  ICD-9-CM: 288.60  05/15/2019        Polycythemia ICD-10-CM: D75.1  ICD-9-CM: 238.4  05/15/2019        S/P laminectomy ICD-10-CM: U54.270  ICD-9-CM: V45.89  06/11/2018        Spinal stenosis of lumbar region with neurogenic claudication ICD-10-CM: M48.062  ICD-9-CM: 724.03  06/09/2018        Hypertension ICD-10-CM: I10  ICD-9-CM: 401.9  Unknown            Active Problems:    Hypertension ()      Pulmonary emboli ([place]) (05/15/2019)      Pancreatic mass (05/15/2019)      Leukocytosis (05/15/2019)      Polycythemia (05/15/2019)         Plan:  Care management per Hospitalist  --IV Heparin gtt for Bilateral PE  -Stop heparin at 9am  Consent for diagnostic laparotomy, pleurX cath placement and infusa port tomorrow  NPO after midnight      [place], NP

## 2019-05-19 ENCOUNTER — Inpatient Hospital Stay: Admit: 2019-05-19 | Payer: MEDICARE | Primary: Family Medicine

## 2019-05-19 LAB — CBC WITH AUTOMATED DIFF
ABS. BASOPHILS: 0 10*3/uL (ref 0.0–0.2)
ABS. EOSINOPHILS: 0.1 10*3/uL (ref 0.0–0.8)
ABS. IMM. GRANS.: 0.3 10*3/uL (ref 0.0–0.5)
ABS. LYMPHOCYTES: 1.2 10*3/uL (ref 0.5–4.6)
ABS. MONOCYTES: 1 10*3/uL (ref 0.1–1.3)
ABS. NEUTROPHILS: 8.5 10*3/uL — ABNORMAL HIGH (ref 1.7–8.2)
ABSOLUTE NRBC: 0 10*3/uL (ref 0.0–0.2)
BASOPHILS: 0 % (ref 0.0–2.0)
EOSINOPHILS: 1 % (ref 0.5–7.8)
HCT: 45.2 % (ref 41.1–50.3)
HGB: 14.6 g/dL (ref 13.6–17.2)
IMMATURE GRANULOCYTES: 2 % (ref 0.0–5.0)
LYMPHOCYTES: 10 % — ABNORMAL LOW (ref 13–44)
MCH: 26.7 PG (ref 26.1–32.9)
MCHC: 32.3 g/dL (ref 31.4–35.0)
MCV: 82.8 FL (ref 79.6–97.8)
MONOCYTES: 9 % (ref 4.0–12.0)
MPV: 9.3 FL — ABNORMAL LOW (ref 9.4–12.3)
NEUTROPHILS: 77 % (ref 43–78)
PLATELET: 246 10*3/uL (ref 150–450)
RBC: 5.46 M/uL (ref 4.23–5.6)
RDW: 13.2 % (ref 11.9–14.6)
WBC: 11.1 10*3/uL (ref 4.3–11.1)

## 2019-05-19 LAB — PLEASE READ & DOCUMENT PPD TEST IN 72 HRS
PPD: NEGATIVE Negative
mm Induration: 0 mm (ref 0–5)

## 2019-05-19 LAB — HEPARIN XA UFH: Heparin Xa UFH: 0.28 IU/mL — ABNORMAL LOW (ref 0.3–0.7)

## 2019-05-19 LAB — GLUCOSE, POC: Glucose (POC): 108 mg/dL — ABNORMAL HIGH (ref 65–100)

## 2019-05-19 MED ORDER — LACTATED RINGERS IV
INTRAVENOUS | Status: AC
Start: 2019-05-19 — End: 2019-05-20
  Administered 2019-05-19: 20:00:00 via INTRAVENOUS

## 2019-05-19 MED ORDER — PHENYLEPHRINE 10 MG/ML INJECTION
10 mg/mL | INTRAMUSCULAR | Status: DC | PRN
Start: 2019-05-19 — End: 2019-05-19
  Administered 2019-05-19 (×4): via INTRAVENOUS

## 2019-05-19 MED ORDER — LACTATED RINGERS IV
INTRAVENOUS | Status: DC
Start: 2019-05-19 — End: 2019-05-19

## 2019-05-19 MED ORDER — ONDANSETRON (PF) 4 MG/2 ML INJECTION
4 mg/2 mL | INTRAMUSCULAR | Status: DC | PRN
Start: 2019-05-19 — End: 2019-05-19
  Administered 2019-05-19: 23:00:00 via INTRAVENOUS

## 2019-05-19 MED ORDER — ALBUTEROL SULFATE 0.083 % (0.83 MG/ML) SOLN FOR INHALATION
2.5 mg /3 mL (0.083 %) | RESPIRATORY_TRACT | Status: AC
Start: 2019-05-19 — End: 2019-05-19
  Administered 2019-05-19: 12:00:00 via RESPIRATORY_TRACT

## 2019-05-19 MED ORDER — FENTANYL CITRATE (PF) 50 MCG/ML IJ SOLN
50 mcg/mL | INTRAMUSCULAR | Status: AC
Start: 2019-05-19 — End: ?

## 2019-05-19 MED ORDER — SODIUM CHLORIDE 0.9 % IJ SYRG
Freq: Three times a day (TID) | INTRAMUSCULAR | Status: DC
Start: 2019-05-19 — End: 2019-05-19

## 2019-05-19 MED ORDER — OXYCODONE-ACETAMINOPHEN 5 MG-325 MG TAB
5-325 mg | ORAL | Status: DC | PRN
Start: 2019-05-19 — End: 2019-05-19

## 2019-05-19 MED ORDER — SODIUM CHLORIDE 0.9 % IV
INTRAVENOUS | Status: DC
Start: 2019-05-19 — End: 2019-05-19

## 2019-05-19 MED ORDER — SUGAMMADEX 100 MG/ML INTRAVENOUS SOLUTION
100 mg/mL | INTRAVENOUS | Status: AC
Start: 2019-05-19 — End: ?

## 2019-05-19 MED ORDER — SODIUM CHLORIDE 0.9 % IJ SYRG
INTRAMUSCULAR | Status: DC | PRN
Start: 2019-05-19 — End: 2019-05-19

## 2019-05-19 MED ORDER — MIDAZOLAM 1 MG/ML IJ SOLN
1 mg/mL | Freq: Once | INTRAMUSCULAR | Status: DC
Start: 2019-05-19 — End: 2019-05-19

## 2019-05-19 MED ORDER — SUCCINYLCHOLINE CHLORIDE 20 MG/ML INJECTION
20 mg/mL | INTRAMUSCULAR | Status: DC | PRN
Start: 2019-05-19 — End: 2019-05-19
  Administered 2019-05-19: 22:00:00 via INTRAVENOUS

## 2019-05-19 MED ORDER — HYDROMORPHONE (PF) 2 MG/ML IJ SOLN
2 mg/mL | INTRAMUSCULAR | Status: DC | PRN
Start: 2019-05-19 — End: 2019-05-19

## 2019-05-19 MED ORDER — SODIUM CHLORIDE 0.9 % INJECTION
25 mg/mL | Freq: Once | INTRAMUSCULAR | Status: DC | PRN
Start: 2019-05-19 — End: 2019-05-19

## 2019-05-19 MED ORDER — FUROSEMIDE 10 MG/ML IJ SOLN
10 mg/mL | INTRAMUSCULAR | Status: AC
Start: 2019-05-19 — End: 2019-05-19
  Administered 2019-05-19: 12:00:00 via INTRAVENOUS

## 2019-05-19 MED ORDER — HEPARIN (PORCINE) 5,000 UNIT/ML IJ SOLN
5000 unit/mL | Freq: Once | INTRAMUSCULAR | Status: AC
Start: 2019-05-19 — End: 2019-05-19
  Administered 2019-05-19: 10:00:00 via INTRAVENOUS

## 2019-05-19 MED ORDER — SUGAMMADEX 100 MG/ML INTRAVENOUS SOLUTION
100 mg/mL | INTRAVENOUS | Status: DC | PRN
Start: 2019-05-19 — End: 2019-05-19
  Administered 2019-05-19: via INTRAVENOUS

## 2019-05-19 MED ORDER — DIPHENHYDRAMINE HCL 50 MG/ML IJ SOLN
50 mg/mL | Freq: Once | INTRAMUSCULAR | Status: DC | PRN
Start: 2019-05-19 — End: 2019-05-19

## 2019-05-19 MED ORDER — ROCURONIUM 10 MG/ML IV
10 mg/mL | INTRAVENOUS | Status: DC | PRN
Start: 2019-05-19 — End: 2019-05-19
  Administered 2019-05-19 (×2): via INTRAVENOUS

## 2019-05-19 MED ORDER — MIDAZOLAM 1 MG/ML IJ SOLN
1 mg/mL | Freq: Once | INTRAMUSCULAR | Status: DC | PRN
Start: 2019-05-19 — End: 2019-05-19

## 2019-05-19 MED ORDER — LACTATED RINGERS BOLUS IV
Freq: Once | INTRAVENOUS | Status: DC
Start: 2019-05-19 — End: 2019-05-19

## 2019-05-19 MED ORDER — LIDOCAINE HCL 1 % (10 MG/ML) IJ SOLN
10 mg/mL (1 %) | INTRAMUSCULAR | Status: DC | PRN
Start: 2019-05-19 — End: 2019-05-19

## 2019-05-19 MED ORDER — CEFAZOLIN 2 GRAM/20 ML IN STERILE WATER INTRAVENOUS SYRINGE
2 gram/0 mL | INTRAVENOUS | Status: AC
Start: 2019-05-19 — End: 2019-05-19
  Administered 2019-05-19: 22:00:00 via INTRAVENOUS

## 2019-05-19 MED ORDER — ETOMIDATE 2 MG/ML IV SOLN
2 mg/mL | INTRAVENOUS | Status: DC | PRN
Start: 2019-05-19 — End: 2019-05-19
  Administered 2019-05-19: 22:00:00 via INTRAVENOUS

## 2019-05-19 MED ORDER — HEPARIN, PORCINE (PF) 100 UNIT/ML IV SYRINGE
100 unit/mL | INTRAVENOUS | Status: AC
Start: 2019-05-19 — End: ?

## 2019-05-19 MED ORDER — FENTANYL CITRATE (PF) 50 MCG/ML IJ SOLN
50 mcg/mL | Freq: Once | INTRAMUSCULAR | Status: DC
Start: 2019-05-19 — End: 2019-05-19

## 2019-05-19 MED ORDER — OXYCODONE 5 MG TAB
5 mg | Freq: Once | ORAL | Status: DC | PRN
Start: 2019-05-19 — End: 2019-05-19

## 2019-05-19 MED ORDER — SODIUM CHLORIDE 0.9 % IV
INTRAVENOUS | Status: AC
Start: 2019-05-19 — End: ?

## 2019-05-19 MED ORDER — FAMOTIDINE 20 MG TAB
20 mg | Freq: Once | ORAL | Status: DC
Start: 2019-05-19 — End: 2019-05-19

## 2019-05-19 MED ORDER — FENTANYL CITRATE (PF) 50 MCG/ML IJ SOLN
50 mcg/mL | INTRAMUSCULAR | Status: DC | PRN
Start: 2019-05-19 — End: 2019-05-19
  Administered 2019-05-19 (×2): via INTRAVENOUS

## 2019-05-19 MED FILL — FAMOTIDINE (PF) 20 MG/2 ML IV: 20 mg/2 mL | INTRAVENOUS | Qty: 2

## 2019-05-19 MED FILL — HEPARIN LOCK FLUSH (PORCINE) (PF) 100 UNIT/ML INTRAVENOUS SYRINGE: 100 unit/mL | INTRAVENOUS | Qty: 5

## 2019-05-19 MED FILL — FUROSEMIDE 10 MG/ML IJ SOLN: 10 mg/mL | INTRAMUSCULAR | Qty: 2

## 2019-05-19 MED FILL — CIPROFLOXACIN 250 MG TAB: 250 mg | ORAL | Qty: 1

## 2019-05-19 MED FILL — ALBUTEROL SULFATE 0.083 % (0.83 MG/ML) SOLN FOR INHALATION: 2.5 mg /3 mL (0.083 %) | RESPIRATORY_TRACT | Qty: 1

## 2019-05-19 MED FILL — BRIDION 100 MG/ML INTRAVENOUS SOLUTION: 100 mg/mL | INTRAVENOUS | Qty: 2

## 2019-05-19 MED FILL — CEFAZOLIN 10 GRAM SOLUTION FOR INJECTION: 10 gram | INTRAMUSCULAR | Qty: 2

## 2019-05-19 MED FILL — CEFAZOLIN 2 GRAM/20 ML IN STERILE WATER INTRAVENOUS SYRINGE: 2 gram/0 mL | INTRAVENOUS | Qty: 20

## 2019-05-19 MED FILL — FENTANYL CITRATE (PF) 50 MCG/ML IJ SOLN: 50 mcg/mL | INTRAMUSCULAR | Qty: 2

## 2019-05-19 MED FILL — HEPARIN (PORCINE) 5,000 UNIT/ML IJ SOLN: 5000 unit/mL | INTRAMUSCULAR | Qty: 1

## 2019-05-19 MED FILL — SODIUM CHLORIDE 0.9 % IV: INTRAVENOUS | Qty: 500

## 2019-05-19 NOTE — Op Note (Signed)
[place]  OPERATIVE REPORT    Name:  GARRUS, ROLLEY  MR#:  [id number]  DOB:  1940-10-02  ACCOUNT #:  [id number]  DATE OF SERVICE:  05/19/2019    PREOPERATIVE DIAGNOSIS:  Pancreatic cancer.    POSTOPERATIVE DIAGNOSIS:  Pancreatic cancer with extensive peritoneal carcinomatosis concerning for small bowel obstruction and localized perforation/peritonitis in pelvis.    PROCEDURE PERFORMED:  1.  Port-A-Cath placement under fluoroscopy.  2.  Diagnostic laparoscopy with peritoneal biopsy and placement of pelvic drain.    SURGEON:  Thayer Jew, MD    ASSISTANT:  Rachael Darby, NP.    ANESTHESIA:  General.    COMPLICATIONS:  None.    SPECIMENS REMOVED:  As above    IMPLANTS:  Blake x 1    ESTIMATED BLOOD LOSS:  Minimal.    INDICATIONS:  This is a 78 year old gentleman who presented with abdominal pain and scan showed a pancreatic lesion with peritoneal disease.  We offered him Port-A-Cath placement for potential chemotherapy and a diagnostic laparoscopy for confirmation of his diagnosis.  We also offered him a PleurX catheter placement due to his peritoneal disease.  He understood the risks and benefits and agreed to proceed.    FINDINGS:  1.  Fluoroscopy confirmed port placement.  2.  Laparoscopy showed diffuse peritoneal carcinomatosis with moderate amount of ascites.  There are several small bowel loops in the pelvis which were apparently infiltrated with inflammatory changes around.  This could represent bowel invasion with contained perforation, so PleurX catheter was not placed, instead a temporary drain which is #79 Blake drain was left in pelvis.    PROCEDURE:  After informed consent was obtained, the patient was brought into the operating room, left in supine position.  General anesthesia was administered.  The patient's neck, chest, and abdomen were all prepped and draped in routine fashion.  We first placed the port and then left subclavian vein was accessed.  Venous blood return was obtained.   Guidewire was then advanced under the guidance of fluoroscopy followed by a dilator introducer sheath.  Then, the catheter was advanced along the introducer sheath under the guidance of fluoroscopy.  At the same time, pocket was made at the left upper chest.  The catheter was tunneled through the pocket and connected to a reservoir.  Both reservoir and the catheter was flushed with heparin solution 100 units per mL with good aspiration and good flush.  Then, the port was sutured into place with 2-0 Prolene stitch.  Incision closed with 3-0 Vicryl stitch on dermal layer, 4-0 Vicryl stitch in subcuticular running fashion.  The port site was dressed and then we moved attention to the abdomen.    The infraumbilical incision was made and Hasson cannula inserted and pneumoperitoneum created.  The ascites was immediately evacuated and surprisingly ascites was not significant and instead he had a very extensive omental disease, peritoneal disease, and the small bowel looped distended.  Then, we were able to place a second 5-mm trocar in the right mid abdomen and then about a liter and a half fluid was removed and then we started to have more room to look around and indeed the peritoneal disease was very extensive, but more significantly, there were several small bowel loops stuck in the pelvis and there was a quite a bit inflammatory changes with fibrinous exudate around this area and appeared to be there were infections going on in the pelvis.  This might represent perforated intestines and walled-off abscess.  It would be reasonable to think this was caused by cancer invasion.  Given this infectious process, I decided not to proceed with a PleurX catheter.  We did take a piece of peritoneum for pathology.  At the end of the case, good hemostasis obtained.  No evidence of bowel injury and then pelvic drain was left.  This was a #19 Blake and then all the trocars were removed.  The  infraumbilical incision was closed on the fascia with 0 Vicryl stitch.  All skin incision closed with 4-0 Vicryl stitch in subcuticular fashion.  The patient tolerated the procedure well and transferred to recovery room in stable condition.  All the instrument count and lap count were correct.    As noted, Rachael Darby is the first assistant in this case and she offered excellent exposure to make this surgery quicker and easier.      Thayer Jew, MD      [id number]  D:  05/19/2019 19:50  T:  05/20/2019 3:34  JOB #:  [id number]

## 2019-05-19 NOTE — Other (Signed)
XRay at bedside. PCXRay completed without issue.

## 2019-05-19 NOTE — Brief Op Note (Signed)
Brief Postoperative Note    Patient: Matthew Sherman  Date of Birth: 29-Sep-1940  MRN: [id number]    Date of Procedure: 05/19/2019     Pre-Op Diagnosis: PANCREATIC CA    Post-Op Diagnosis: Same as preoperative diagnosis.  With very extensive peritoneal carcinomatosis, concerning for SBO and localized perforation/peritonitis in pelvis    Procedure(s):  LAPAROSCOPY GENERAL DIAGNOSTIC with peritoneal biopsy  INFUSAPORT INSERTION with fluoro    Surgeon(s):  Thayer Jew, MD    Surgical Assistant: None    Anesthesia: General     Estimated Blood Loss (mL): Minimal    Complications: None    Specimens:   ID Type Source Tests Collected by Time Destination   1 : Peritoneal Biopsy Preservative Peritoneum  Thayer Jew, MD 05/19/2019  7:09 PM Pathology        Implants:   Implant Name Type Inv. Item Serial No. Manufacturer Lot No. LRB No. Used Action   PORT INFUS 8FR PLAS ATTCH OPN END POLYUR CATH SIL FILL SUT - [id number]  PORT INFUS 8FR PLAS ATTCH OPN END POLYUR CATH SIL FILL SUT  BARD PERIPHERAL VASCULAR_WD [id number] Left 1 Implanted       Drains:   Cleone Slim #1 05/19/19 Outer;Right Abdomen (Active)       Findings:   1. diffuse peritoneal carcinomatosis with moderate ascites  2. Several small bowel loops were infiltrated in pelvis with inflammatory changes around, this could represent bowel invasion with contained perforation.     Electronically Signed by Thayer Jew, MD on 05/19/2019 at 7:38 PM

## 2019-05-19 NOTE — Other (Signed)
TRANSFER - OUT REPORT:    Verbal report given to [facility], RN(name) on Matthew Sherman  being transferred to Periop(unit) for ordered procedure       Report consisted of patient???s Situation, Background, Assessment and   Recommendations(SBAR).     Information from the following report(s) SBAR, Kardex, [facility] and Cardiac Rhythm NSR was reviewed with the receiving nurse.    Lines:   Peripheral IV 05/15/19 Right Antecubital (Active)   Site Assessment Clean, dry, & intact 05/19/19 0827   Phlebitis Assessment 0 05/19/19 0827   Infiltration Assessment 0 05/19/19 0827   Dressing Status Clean, dry, & intact 05/19/19 0827   Dressing Type Transparent 05/19/19 0827   Hub Color/Line Status Patent;Flushed 05/19/19 0827   Alcohol Cap Used No 05/19/19 0827       Peripheral IV 05/18/19 Left Forearm (Active)   Site Assessment Clean, dry, & intact 05/19/19 0827   Phlebitis Assessment 0 05/19/19 0827   Infiltration Assessment 0 05/19/19 0827   Dressing Status Clean, dry, & intact 05/19/19 0827   Dressing Type Transparent 05/19/19 0827   Hub Color/Line Status Patent;Flushed 05/19/19 0827   Alcohol Cap Used No 05/19/19 0827       Peripheral IV 05/18/19 Anterior;Distal;Right Forearm (Active)   Site Assessment Clean, dry, & intact 05/19/19 0827   Phlebitis Assessment 0 05/19/19 0827   Infiltration Assessment 0 05/19/19 0827   Dressing Status Clean, dry, & intact 05/19/19 0827   Dressing Type Transparent 05/19/19 0827   Hub Color/Line Status Patent;Flushed 05/19/19 0827   Alcohol Cap Used No 05/19/19 0827        Opportunity for questions and clarification was provided.      Patient to be transported with:   O2 @ 3 liters

## 2019-05-19 NOTE — Progress Notes (Signed)
[facility] Progress Note         Admit Date:  05/15/2019    Today's Date:  05/19/2019    CC:  Pancreatic tumor    Subjective:     Patient s/p EUS yesterday revealing T2N0 pancreatic mass, large volume ascites, possible carcinomatosis. Per Dr. 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 For diagnostic lap with pleurXcath placement today.     Medications:   Current Facility-Administered Medications   Medication Dose Route Frequency   ??? lisinopriL (PRINIVIL, ZESTRIL) tablet 5 mg  5 mg Oral DAILY   ??? levoFLOXacin (LEVAQUIN) 500 mg in D5W IVPB  500 mg IntraVENous ENDO ONCE   ??? ciprofloxacin HCl (CIPRO) tablet 250 mg  250 mg Oral Q12H   ??? prucalopride (MOTEGRITY) tab 2 mg (Patient Supplied)  2 mg Oral DAILY   ??? famotidine (PF) (PEPCID) 20 mg in 0.9% sodium chloride 10 mL injection  20 mg IntraVENous Q12H   ??? oxyCODONE-acetaminophen (PERCOCET) 5-325 mg per tablet 1 Tab  1 Tab Oral Q4H PRN   ??? oxyCODONE-acetaminophen (PERCOCET 10) 10-325 mg per tablet 1 Tab  1 Tab Oral Q4H PRN   ??? sodium chloride (NS) flush 5-40 mL  5-40 mL IntraVENous Q8H   ??? sodium chloride (NS) flush 5-40 mL  5-40 mL IntraVENous PRN   ??? acetaminophen (TYLENOL) tablet 650 mg  650 mg Oral Q6H PRN    Or   ??? acetaminophen (TYLENOL) suppository 650 mg  650 mg Rectal Q6H PRN   ??? polyethylene glycol (MIRALAX) packet 17 g  17 g Oral DAILY PRN   ??? ondansetron (ZOFRAN) injection 4 mg  4 mg IntraVENous Q6H PRN           Objective:   Vitals:  Visit Vitals  BP 112/65 (BP 1 Location: Left arm, BP Patient Position: At rest)   Pulse 84   Temp 97.9 ??F (36.6 ??C)   Resp 20   Ht 6' (1.829 m)   Wt 97.1 kg (214 lb)   SpO2 97%   BMI 29.02 kg/m??     Intake/Output:  10/21 0701 - 10/21 1900  In: -   Out: 100 [Urine:100]  10/19 1901 - 10/21 0700  In: 980 [P.O.:480; I.V.:500]  Out: 800 [Urine:800]  Exam:  General appearance: awake, alert, conversant, NAD    Data Review (Labs):    Recent Labs     05/19/19  0419 05/18/19  0332 05/17/19  0502    WBC 11.1 13.6* 18.5*   HGB 14.6 14.5 15.7   HCT 45.2 43.7 48.5   PLT 246 236 237   MCV 82.8 82.8 84.1   NA  --  136  --    K  --  4.0  --    CL  --  106  --    CO2  --  25  --    BUN  --  20  --    CREA  --  0.82  --    CA  --  8.4  --    GLU  --  98  --    AP  --  73  --    ALT  --  48  --    TBILI  --  0.4  --    ALB  --  2.1*  --    TP  --  5.2*  --    PTP  --  16.6*  --    INR  --  1.3  --  Assessment:     Active Problems:    Hypertension ()      Pulmonary emboli ([place]) (05/15/2019)      Pancreatic mass (05/15/2019)      Leukocytosis (05/15/2019)      Polycythemia (05/15/2019)      78 y.o. male with PMH of HTN and lumbar laminectomy, who is admitted for bilateral PE (on heparin gtt) and seen by GI for one year hx of abdominal distention, anorexia, change in bowel habits, and pancreatic mass with peritoneal carcinomatosis on CT. CA19-9 elevated at 2900. EUS 10/20 with T2N0 pancreatic mass--in situ carcinoma  Plan:   1) scheduled for diagnostic lap and pleurX cath placement today  2) we will sign off. Please call with questions.  Corrinne Eagle, PA

## 2019-05-19 NOTE — Other (Signed)
Patient Demographics     Patient Name   Matthew Sherman, Matthew Sherman   [id number]  Sex   Male  DOB   Dec 28, 1940  Address   [address]   [place] [address]  Phone   [phone] (Home)   [phone] (Mobile) *Preferred*    CSN:    [id number]    Admit Date:  Admit Time  Room  Bed    May 15, 2019  ??2:14 PM  321 [10051]  01 [805]    Attending Providers     Provider  Pager  From  To    Dulcy Fanny, MD   05/15/19  05/15/19    Dial, Beola Cord, MD   05/15/19  05/15/19    Orion Modest, MD   05/15/19  05/18/19    Scherrie Merritts, DO   05/18/19     Emergency Contact(s)     Name  Relation  Home  Work  [place], [place]  Spouse  [phone]   [phone]    Utilization Reviews     ??   Pulmonary Embolism - Care Day 3 (05/17/2019) by Loyce Dys, RN     ??   Review Entered  Review Status    05/19/2019 09:05  Completed    ??   Criteria Review       Care Day: 3 Care Date: 05/17/2019 Level of Care:    Guideline Day 2    Level Of Care    (X) ICU or floor    Clinical Status    (X) * Hemodynamic stability    05/19/2019 09:05:25 EDT by [place] Moore    ?? BP 131/76  TEMP 97.8  HR 85  RR 22  O2 98 ON 3L NC    (X) * Oxygenation stable or improved    05/19/2019 09:05:25 EDT by [place] Moore    ?? 3L NC    (X) * Dyspnea absent or improved    05/19/2019 09:05:25 EDT by [place] Moore    ?? OCCASIONAL DYSPNEA    (X) * Tachypnea absent or improved    05/19/2019 09:05:25 EDT by [place] Moore    ?? RR 18    (X) * PTT in therapeutic range or not indicated [Q]    05/19/2019 09:05:25 EDT by [place] Moore    ?? NOT CHECKED    Activity    ( ) * Limited ambulation    05/19/2019 09:05:25 EDT by [place] Moore    ?? BEDREST    Routes    (X) Oral hydration, medications    (X) Usual diet    Interventions    (X) Possible cardiac monitoring    05/19/2019 09:05:25 EDT by [place] Moore    ?? YES    Medications    (X) Anticoagulants [I]    05/19/2019 09:05:25 EDT by  Moore    ?? HEPARIN DRIP     * Milestone    Additional Notes    IM NOTE    Patient requesting pain meds. ??Oral narcotic pain meds ordered.       not eating too much, no BM, has abdominal distention, some occasional pain and dyspnea       General: ?? ?? ?? ?? ??Well nourished. ??Alert. ??No distress    CV: ?? ?? ?? ?? ?? ?? ?? ?? ??RRR. ??No murmur, rub, or gallop. No edema    Lungs: ?? ?? ?? ?? ?? ?? CTAB. ??No wheezing, rhonchi, or rales. anterior  Abdomen: ?? ?? ?? ??Distended, decreased BS, nontender    Extremities: ?? ?? Warm and dry    Skin: ?? ?? ?? ?? ?? ?? ?? ??No rashes or jaundice.    Neuro: ?? ?? ?? ?? ?? ?? No gross focal deficits    ??    Plan:    ??    ?? Bilateral PE:    ?? IV heparin drip    ?? ECHO pending    ?? O2 as needed    ??    ??    ?? Pancreatic tumor:    ?? Consulted GI, surgery and oncology    ?? Diet as tolerant    ?? Pending EGD/EUS 05-18-19    ?? Pending diagnostic lap, port and abdominal pleurex for 10-21.    ??    ??    ??    ?? HTN:    ?? Holding lisinopril    ??    ??    ?? Polycythemia:    ?? Stable CBC    ??    ??    ?? Leukocytosis:    ?? negative UA    ?? followup CBC as may be stress /cancer reaction    ??    ??    ?? Elevated LFTS:    ?? Repeat lab stable    ??             GI NOTE    Patient somnolent--just finished getting ECHO. Currently denies SOB. On 3 L O2. Currently denies abdominal pain and nausea, but then when asked about chest pain, he indicates he has been having some epigastric pain. Reports one episode of vomiting earlier this admission. Last BM was 2 days ago.    ??    ??scheduled for EGD/EUS tomorrow, 05/18/19 at 1430 with Dr. Mare Ferrari. Will hold heparin gtt starting at 0830    Abdomen is distended and somewhat firm. NT + BS. ?? Has ascites on CT.Labs as above. Pancreatic mass to be further evaluated with EUS tomorrow. ??Dr Sheria Lang also planning laparoscopy etc. Heparin to be held in am. Check coags. ??Oncology following as well.             GEN SURG NOTE    Plan:    Care management per Hospitalist    --IV Heparin gtt for Bilateral PE    --ECHO pending    GI Following     --Pending EGD.EUS 10-20    Oncology following    ---CEA and Ca 19-9    General Surgery    ??    Plan port, diagnostic laparoscopy with abdominal PleurX catheter Wed/Thurs timing per Dr. Sheria Lang.             HEM NOTE    24 Hour Events:    Afebrile, VSS, on O2 @ 3L    Dr. Sheria Lang with plans for pancreatic mass bx, port, and abd pleurx    On heparin gtt for PEs    Wife at bedside    ??    RECOMMENDATIONS:    Pancreatic Mass    -check CEA and Ca 19-9    -will need biopsy    -consulted Dr Sheria Lang for biopsy, staging, and port placement    10/19 CEA 2.5, CA 19-9 2926. ??Dr. Sheria Lang with plans for bx, port, and abd pleurx.    ??    B/L PEs    -Hep gtt    -BLE doppler negative for DVT    10/19 on heparin  gtt. ??Echo pending.    ??    Leukocytosis    -WBC 17.7, down from 18.4 yesterday    -will monitor    10/19 WBC 18k    ??    Elevated LFTs    -Bili normal 0.2    -ALT 66, down from 97    -AST 35, down from 50    -monitor    ??          WBC 18.5       NO IMAGING       NEURO ASSESS, CARD MONITORING       PEPCID 20 MG BID IV, PERCOCET 10 PO X 2 AND 5 PO X 1, NS DRIP @ 75          ??   Pulmonary Embolism - Care Day 2 (05/16/2019) by Loyce Dys, RN     ??   Review Entered  Review Status    05/19/2019 08:55  Completed    ??   Criteria Review       Care Day: 2 Care Date: 05/16/2019 Level of Care:    Guideline Day 2    Level Of Care    (X) ICU or floor    05/19/2019 08:55:37 EDT by [place] Moore    ?? FLOOR    Clinical Status    (X) * Hemodynamic stability    05/19/2019 08:55:37 EDT by [place] Moore    ?? BP 146/75  TEMP 98.2  HR 87  RR 20  O2 97 ON 2L NC    (X) * Oxygenation stable or improved    05/19/2019 08:55:37 EDT by [place] Moore    ?? STABLE ON 2L NC    (X) * Dyspnea absent or improved    05/19/2019 08:55:37 EDT by [place] Moore    ?? IMPROVED BUT STILL HAS SOME DYSPNEA    (X) * Tachypnea absent or improved    05/19/2019 08:55:37 EDT by [place] Moore    ?? RR 20-24    (X) * PTT in therapeutic range or not indicated [Q]     05/19/2019 08:55:37 EDT by [place] Moore    ?? NOT CHECKED    Activity    ( ) * Limited ambulation    05/19/2019 08:55:37 EDT by [place] Moore    ?? BEDREST    Routes    (X) Oral hydration, medications    (X) Usual diet    Interventions    (X) Possible cardiac monitoring    Medications    (X) Anticoagulants [I]    05/19/2019 08:55:37 EDT by [place] Moore    ?? HEPARIN DRIP    * Milestone    Additional Notes    IM NOTE    05-16-19 has anorexia, no edema, some dyspnea, abd distention    ??    General: ?? ?? ?? ?? ??Well nourished. ??Alert. ??No distress    CV: ?? ?? ?? ?? ?? ?? ?? ?? ??RRR. ??No murmur, rub, or gallop. No edema    Lungs: ?? ?? ?? ?? ?? ?? CTAB. ??No wheezing, rhonchi, or rales. anterior    Abdomen: ?? ?? ?? ??Distended, decreased BS    Extremities: ?? ?? Warm and dry    Skin: ?? ?? ?? ?? ?? ?? ?? ??No rashes or jaundice.    Neuro: ?? ?? ?? ?? ?? ?? No gross focal deficits    ??    ??    Plan:    ??    ??  Bilateral PE:    ?? IV heparin drip    ?? ECHO pending    ?? O2 as needed    ??    ??    ?? Pancreatic tumor:    ?? Consulted GI and oncology    ?? Diet as tolerant pending EHD/EUS    ?? Pending EGD.EUS 10-20    ??    ??    ?? HTN:    ?? Holding lisinopril    ??    ??    ?? Polycythemia:    ?? Stable CBC    ??    ??    ?? Leukocytosis:    ?? pending UA    ?? followup CBC as may be stress /cancer reaction    ??    ??    ?? Elevated LFTS:    ?? Repeat lab stable    ??          GI CONSULT    will need EGD/EUS with FNA by either Dr. Mare Ferrari Tuesday or Dr. Tito Dine Wednesday    - will need to hold Heparin prior    - will also need oncology consult    - supportive care until this time.    ??    Of note: he has EGD/ colo scheduled 10/28 with Dr. Augustin Coupe- Dr. Augustin Coupe made aware of current admission.             ONC CONSULT    RECOMMENDATIONS:    Pancreatic Mass    -check CEA and Ca 19-9    -will need biopsy    -consulted Dr Sheria Lang for biopsy, staging, and port placement    ??    B/L PEs    -Hep gtt    -BLE doppler negative for DVT    ??    Leukocytosis     -WBC 17.7, down from 18.4 yesterday    -will monitor    ??    Elevated LFTs    -Bili normal 0.2    -ALT 66, down from 97    -AST 35, down from 50    -monitor             GEN SURG CONSULT    Plan:    Care management per Hospitalist    --IV Heparin gtt for Bilateral PE    --ECHO pending    GI Following    --Pending EGD.EUS 10-20    Oncology following    ---CEA and Ca 19-9    General Surgery    --Timing of biopsy and port placement per Dr. Sheria Lang       Plan port, diagnostic laparoscopy with abdominal PleurX catheter soon.             WBC 17.7, ALB 2.1, BLOOD CULT       U/A: SP GR 1.047, PROT 30, BILI MOD, MUCUS 2+          NO IMAGING       BEDREST, I/O, GEN SURG CONSULT, NEURO ASSESS, CARD MONITORING       PEPCID 20 MG BID IV, ZOFRAN 4 MG IV X 1, NS DRIP @ 75

## 2019-05-19 NOTE — Progress Notes (Signed)
Progress Note    Patient: Matthew Sherman MRN: [id number]  SSN: [id number]    Date of Birth: 08-11-1940  Age: 78 y.o.  Sex: male      Admit Date: 05/15/2019    LOS: 4 days     Subjective:   F/U pancreatic tumor    "78 yo male with PMH of HTN and Lumbar laminectomy who is evaluated with progressive shortness of breath and found with bilateral PE and pancreatitic tumor.  CT Chest/ AP done in the ED and shows primary pancreas tumor, peritoneal carcinomatosis and bilateral PE. He has been placed on IV heparin. Duplex LE no DVT." GI, surgery Dr. Sheria Lang and oncology consulted. EGD and EUS with FNA performed on 10/20. Also planning diagnostic lap, port and abdominal pleurex for 10-21. CEA 2.5. [place] antigen 19-9 2,926.2. ECHO with EF 55%, wall thickness mildly increased.      No chest pain. Admits to increased SOB. Occasional abdominal pain at upper quadrants. Feels as if his abdomen is larger.   Current Facility-Administered Medications   Medication Dose Route Frequency   ??? lisinopriL (PRINIVIL, ZESTRIL) tablet 5 mg  5 mg Oral DAILY   ??? levoFLOXacin (LEVAQUIN) 500 mg in D5W IVPB  500 mg IntraVENous ENDO ONCE   ??? ciprofloxacin HCl (CIPRO) tablet 250 mg  250 mg Oral Q12H   ??? prucalopride (MOTEGRITY) tab 2 mg (Patient Supplied)  2 mg Oral DAILY   ??? famotidine (PF) (PEPCID) 20 mg in 0.9% sodium chloride 10 mL injection  20 mg IntraVENous Q12H   ??? oxyCODONE-acetaminophen (PERCOCET) 5-325 mg per tablet 1 Tab  1 Tab Oral Q4H PRN   ??? oxyCODONE-acetaminophen (PERCOCET 10) 10-325 mg per tablet 1 Tab  1 Tab Oral Q4H PRN   ??? sodium chloride (NS) flush 5-40 mL  5-40 mL IntraVENous Q8H   ??? sodium chloride (NS) flush 5-40 mL  5-40 mL IntraVENous PRN   ??? acetaminophen (TYLENOL) tablet 650 mg  650 mg Oral Q6H PRN    Or   ??? acetaminophen (TYLENOL) suppository 650 mg  650 mg Rectal Q6H PRN   ??? polyethylene glycol (MIRALAX) packet 17 g  17 g Oral DAILY PRN   ??? ondansetron (ZOFRAN) injection 4 mg  4 mg IntraVENous Q6H PRN        Objective:     Vitals:    05/19/19 0451 05/19/19 0811 05/19/19 0827 05/19/19 0915   BP: 113/61  112/65    Pulse: 92      Resp: 18      Temp: 97.5 ??F (36.4 ??C)      SpO2: 95% 98%  97%   Weight: 97.1 kg (214 lb)      Height:             Intake and Output:  Current Shift: 10/21 0701 - 10/21 1900  In: -   Out: 100 [Urine:100]  Last three shifts: 10/19 1901 - 10/21 0700  In: 980 [P.O.:480; I.V.:500]  Out: 800 [Urine:800]    Physical Exam:   General:  Alert, cooperative, mild increase work in breathing noted, frail appearing.    Eyes:  Conjunctivae/corneas clear.    Ears:  Normal TMs and external ear canals both ears.   Nose: Nares normal. Septum midline.   Mouth/Throat: Lips, mucosa, and tongue normal.    Neck:  no JVD.   Back:   deferred   Lungs:   Clear to auscultation bilaterally but limited breathe sounds.   Heart:  Regular rate and  rhythm, S1, S2 normal   Abdomen:   Distended, mild tenderness to palpation at upper quadrants without rebound. +BS.    Extremities: Extremities normal, atraumatic, no cyanosis or edema.   Pulses: 2+ and symmetric all extremities.   Skin: Skin color, texture, turgor normal. No rashes or lesions   Lymph nodes: Cervical, supraclavicular, and axillary nodes normal.   Neurologic: CNII-XII intact.       Lab/Data Review:    Recent Results (from the past 24 hour(s))   CBC WITH AUTOMATED DIFF    Collection Time: 05/19/19  4:19 AM   Result Value Ref Range    WBC 11.1 4.3 - 11.1 K/uL    RBC 5.46 4.23 - 5.6 M/uL    HGB 14.6 13.6 - 17.2 g/dL    HCT 45.2 41.1 - 50.3 %    MCV 82.8 79.6 - 97.8 FL    MCH 26.7 26.1 - 32.9 PG    MCHC 32.3 31.4 - 35.0 g/dL    RDW 13.2 11.9 - 14.6 %    PLATELET 246 150 - 450 K/uL    MPV 9.3 (L) 9.4 - 12.3 FL    ABSOLUTE NRBC 0.00 0.0 - 0.2 K/uL    DF AUTOMATED      NEUTROPHILS 77 43 - 78 %    LYMPHOCYTES 10 (L) 13 - 44 %    MONOCYTES 9 4.0 - 12.0 %    EOSINOPHILS 1 0.5 - 7.8 %    BASOPHILS 0 0.0 - 2.0 %    IMMATURE GRANULOCYTES 2 0.0 - 5.0 %     ABS. NEUTROPHILS 8.5 (H) 1.7 - 8.2 K/UL    ABS. LYMPHOCYTES 1.2 0.5 - 4.6 K/UL    ABS. MONOCYTES 1.0 0.1 - 1.3 K/UL    ABS. EOSINOPHILS 0.1 0.0 - 0.8 K/UL    ABS. BASOPHILS 0.0 0.0 - 0.2 K/UL    ABS. IMM. GRANS. 0.3 0.0 - 0.5 K/UL   HEPARIN XA UFH    Collection Time: 05/19/19  4:19 AM   Result Value Ref Range    Heparin Xa UFH 0.28 (L) 0.3 - 0.7 IU/mL       Assessment/ Plan:     Active Problems:    Hypertension ()      Pulmonary emboli ([place]) (05/15/2019)      Pancreatic mass (05/15/2019)      Leukocytosis (05/15/2019)      Polycythemia (05/15/2019)    Pancreatic mass- S/p EGD/EUS with biopsy on 10/20. Plan for abdominal pleurX catheter and port placement today. Hematology will see as outpatient follow up with Dr. Ronny Flurry. Increased work of breathing noted this AM. Give one time albuterol nebulizer, and lasix 20mg  IV now. Once pleurx catheter in place, I am sure this will help with his breathing since I think lung capacity limited due to increased abdominal pressure.     PE - heparin drip until after all procedures performed.     Constipation-  Motegrity     HTN- add back low dose ACE    DVT prophylaxis - heparin drip   Signed By: Scherrie Merritts, DO     May 19, 2019

## 2019-05-19 NOTE — Other (Signed)
Bedside and Verbal shift change report given to Matias, RN (oncoming nurse) by myself (offgoing nurse). Report included the following information SBAR, Kardex, MAR and Recent Results.

## 2019-05-19 NOTE — Op Note (Signed)
dictated

## 2019-05-19 NOTE — Progress Notes (Addendum)
TRANSFER - IN REPORT:    Verbal report received from [place] on Matthew Sherman being received from PACU for routine progression of care.     Report consisted of patient???s Situation, Background, Assessment and Recommendations(SBAR).     Information from the following report(s) SBAR, Kardex, Procedure Summary, Intake/Output, MAR and Recent Results was reviewed. Opportunity for questions and clarification was provided.      Assessment completed upon patient???s arrival to unit and care assumed.     Patient received to room 321. Patient connected to monitor and assessment completed. Plan of care reviewed. Patient oriented to room and call light. Patient aware to use call light to communicate any needs     Admission skin assessment completed with second RN and reveals the following:   Patient has a umbilical site dressed with guaze and Tegaderm. Patient has a blake drain in the right lower quadrant of abdomen, with guaze and tape dressing. Patient also has a left port site with Tegaderm and guaze s/p port placement.  Skin is warm dry and intact. Sacrum intact with healed incision mid back, no breakdown at this time. Patient attached to heart monitor, bp cycling on eagle and continuous pulse ox placed on patient.

## 2019-05-19 NOTE — Progress Notes (Signed)
PT NOTE:    Pt off the floor for procedure.  Will check back at later time/date as schedule allows.    Baldemar Friday, PTA

## 2019-05-19 NOTE — Progress Notes (Signed)
Inpatient Hematology / Oncology Daily Progress Note    Reason for Consult:  Pulmonary emboli [facility]) [I26.99]  Referring Physician:  Scherrie Merritts, DO    24 Hour Events:  Afebrile, VSS, on O2 @ 5L  Diag lap, port placement, and abd pleurx today with Dr. Sheria Lang  S/p EUS yesterday - pancreatic mass path and ascitic fluid cytology pending      ROS:  Constitutional: Negative for fever, chills.  CV: Negative for chest pain, palpitations, edema.  Respiratory: +dypsnea  GI: +N/V    10 point review of systems is otherwise negative with the exception of the elements mentioned above in the HPI.       Allergies   Allergen Reactions   ??? Protonix [Pantoprazole] Nausea and Vomiting     Past Medical History:   Diagnosis Date   ??? Hypertension      Past Surgical History:   Procedure Laterality Date   ??? HX APPENDECTOMY  1956   ??? HX CATARACT REMOVAL Bilateral     right done 2018, left 2002   ??? HX LUMBAR LAMINECTOMY  06/09/2018    lami L2-3. L3-4 4-5 Dr. Bradd Canary   ??? HX TONSIL AND ADENOIDECTOMY       Family History   Problem Relation Age of Onset   ??? Heart Attack Father    ??? No Known Problems Sister    ??? No Known Problems Brother    ??? No Known Problems Maternal Grandmother    ??? No Known Problems Maternal Grandfather    ??? No Known Problems Paternal Grandmother    ??? No Known Problems Paternal Grandfather      Social History     Socioeconomic History   ??? Marital status: MARRIED     Spouse name: Not on file   ??? Number of children: Not on file   ??? Years of education: Not on file   ??? Highest education level: Not on file   Occupational History   ??? Not on file   Social Needs   ??? Financial resource strain: Not on file   ??? Food insecurity     Worry: Not on file     Inability: Not on file   ??? Transportation needs     Medical: Not on file     Non-medical: Not on file   Tobacco Use   ??? Smoking status: Never Smoker   ??? Smokeless tobacco: Never Used   Substance and Sexual Activity   ??? Alcohol use: Never     Frequency: Never   ??? Drug use: Never    ??? Sexual activity: Not Currently     Partners: Female   Lifestyle   ??? Physical activity     Days per week: Not on file     Minutes per session: Not on file   ??? Stress: Not on file   Relationships   ??? Social Product manager on phone: Not on file     Gets together: Not on file     Attends religious service: Not on file     Active member of club or organization: Not on file     Attends meetings of clubs or organizations: Not on file     Relationship status: Not on file   ??? Intimate partner violence     Fear of current or ex partner: Not on file     Emotionally abused: Not on file     Physically abused: Not on file  Forced sexual activity: Not on file   Other Topics Concern   ??? Not on file   Social History Narrative   ??? Not on file     Current Facility-Administered Medications   Medication Dose Route Frequency Provider Last Rate Last Dose   ??? furosemide (LASIX) injection 20 mg  20 mg IntraVENous NOW Scherrie Merritts, DO       ??? lisinopriL (PRINIVIL, ZESTRIL) tablet 5 mg  5 mg Oral DAILY Scherrie Merritts, DO   5 mg at 05/18/19 1645   ??? levoFLOXacin (LEVAQUIN) 500 mg in D5W IVPB  500 mg IntraVENous ENDO ONCE Brackbill, Madelin Headings, MD       ??? ciprofloxacin HCl (CIPRO) tablet 250 mg  250 mg Oral Q12H Brackbill, Madelin Headings, MD       ??? prucalopride (MOTEGRITY) tab 2 mg (Patient Supplied)  2 mg Oral DAILY Davis-Pachter, Nilsa Nutting, MD       ??? famotidine (PF) (PEPCID) 20 mg in 0.9% sodium chloride 10 mL injection  20 mg IntraVENous Q12H Davis-Pachter, Lucy E, MD   20 mg at 05/18/19 2133   ??? oxyCODONE-acetaminophen (PERCOCET) 5-325 mg per tablet 1 Tab  1 Tab Oral Q4H PRN Margette Fast, MD   1 Tab at 05/18/19 1803   ??? oxyCODONE-acetaminophen (PERCOCET 10) 10-325 mg per tablet 1 Tab  1 Tab Oral Q4H PRN Margette Fast, MD   1 Tab at 05/17/19 2258   ??? [Held by provider] heparin 25,000 units in dextrose 500 mL infusion  18-36 Units/kg/hr IntraVENous TITRATE Fox, Crystal L, NP 38.1 mL/hr at  05/19/19 0533 20 Units/kg/hr at 05/19/19 0533   ??? sodium chloride (NS) flush 5-40 mL  5-40 mL IntraVENous Q8H Davis-Pachter, Lucy E, MD   10 mL at 05/19/19 0550   ??? sodium chloride (NS) flush 5-40 mL  5-40 mL IntraVENous PRN Davis-Pachter, Nilsa Nutting, MD       ??? acetaminophen (TYLENOL) tablet 650 mg  650 mg Oral Q6H PRN Davis-Pachter, Nilsa Nutting, MD   650 mg at 05/16/19 1445    Or   ??? acetaminophen (TYLENOL) suppository 650 mg  650 mg Rectal Q6H PRN Davis-Pachter, Nilsa Nutting, MD       ??? polyethylene glycol (MIRALAX) packet 17 g  17 g Oral DAILY PRN Davis-Pachter, Nilsa Nutting, MD       ??? ondansetron (ZOFRAN) injection 4 mg  4 mg IntraVENous Q6H PRN Davis-Pachter, Nilsa Nutting, MD   4 mg at 05/16/19 0818       OBJECTIVE:  Patient Vitals for the past 8 hrs:   BP Temp Pulse Resp SpO2 Weight   05/19/19 0451 113/61 97.5 ??F (36.4 ??C) 92 18 95 % 214 lb (97.1 kg)   05/19/19 0054 127/67 98 ??F (36.7 ??C) 85 18 97 % ???     Temp (24hrs), Avg:97.9 ??F (36.6 ??C), Min:97.5 ??F (36.4 ??C), Max:98.2 ??F (36.8 ??C)    No intake/output data recorded.    Physical Exam:  Constitutional: Well developed, well nourished male in no acute distress, sitting comfortably in the bedside chair.   HEENT: Normocephalic and atraumatic. Oropharynx is clear, mucous membranes are moist.  Sclerae anicteric. Neck supple without JVD. No thyromegaly present.    Skin Warm and dry.  No bruising and no rash noted.  No erythema.  No pallor.    Respiratory Lungs are clear to auscultation bilaterally without wheezes, rales or rhonchi, normal air exchange without accessory muscle use.    CVS Normal rate, regular  rhythm and normal S1 and S2.  No murmurs, gallops, or rubs.   Abdomen +distended Soft, nontender and, normoactive bowel sounds.  No palpable mass.  No hepatosplenomegaly.   Neuro Grossly nonfocal with no obvious sensory or motor deficits.   MSK Normal range of motion in general.  No edema and no tenderness.   Psych Appropriate mood and affect.        Labs:     Recent Results (from the past 24 hour(s))   CBC WITH AUTOMATED DIFF    Collection Time: 05/19/19  4:19 AM   Result Value Ref Range    WBC 11.1 4.3 - 11.1 K/uL    RBC 5.46 4.23 - 5.6 M/uL    HGB 14.6 13.6 - 17.2 g/dL    HCT 45.2 41.1 - 50.3 %    MCV 82.8 79.6 - 97.8 FL    MCH 26.7 26.1 - 32.9 PG    MCHC 32.3 31.4 - 35.0 g/dL    RDW 13.2 11.9 - 14.6 %    PLATELET 246 150 - 450 K/uL    MPV 9.3 (L) 9.4 - 12.3 FL    ABSOLUTE NRBC 0.00 0.0 - 0.2 K/uL    DF AUTOMATED      NEUTROPHILS 77 43 - 78 %    LYMPHOCYTES 10 (L) 13 - 44 %    MONOCYTES 9 4.0 - 12.0 %    EOSINOPHILS 1 0.5 - 7.8 %    BASOPHILS 0 0.0 - 2.0 %    IMMATURE GRANULOCYTES 2 0.0 - 5.0 %    ABS. NEUTROPHILS 8.5 (H) 1.7 - 8.2 K/UL    ABS. LYMPHOCYTES 1.2 0.5 - 4.6 K/UL    ABS. MONOCYTES 1.0 0.1 - 1.3 K/UL    ABS. EOSINOPHILS 0.1 0.0 - 0.8 K/UL    ABS. BASOPHILS 0.0 0.0 - 0.2 K/UL    ABS. IMM. GRANS. 0.3 0.0 - 0.5 K/UL   HEPARIN XA UFH    Collection Time: 05/19/19  4:19 AM   Result Value Ref Range    Heparin Xa UFH 0.28 (L) 0.3 - 0.7 IU/mL       Imaging:  CT of the Chest, Abdomen, and Pelvis  ??  INDICATION: Shortness of breath and abdominal pain  ??  Multiple axial images were obtained through the chest, abdomen, and pelvis.   Oral contrast was used for bowel opacification.  167mL of Isovue 370 intravenous  contrast was used for better evaluation of solid organs and vascular structures.   Radiation dose reduction techniques were used for this study.  All CT scans  performed at this facility use one or all of the following: Automated exposure  control, adjustment of the mA and/or kVp according to patient's size, iterative  reconstruction.  ??  COMPARISON: None  ??  FINDINGS:  -LUNGS: Minimal bibasilar infiltrate/atelectasis, left greater than right.  Tiny  bilateral pleural effusions are also present.  No definite pulmonary mass.  -MEDIASTINUM/AXILLA: No significant adenopathy.  -HEART/VESSELS: There are multiple bilateral pulmonary emboli.  Mild   atherosclerosis.  Heart size is normal.  -CHEST WALL: Normal.  ??  -LIVER: Normal in size and appearance.    -GALLBLADDER/BILE DUCTS: Gallbladder is collapsed.  No bile duct dilatation.  -PANCREAS: There is a 2.2 cm mass in the body of the pancreas.  Masses  compressing the splenic vein.  Pancreatic duct is dilated.  -SPLEEN: Normal.  ??  -ADRENALS:  Normal.  -KIDNEYS/URETERS: 6.7 cm cyst in the lower pole the left kidney.  No  hydronephrosis or significant mass.  -BLADDER: Normal.  -REPRODUCTIVE ORGANS: No pelvic masses.  ??  -BOWEL: Mild distention of the transverse colon.  No inflammatory changes.  -LYMPH NODES: No significant retroperitoneal, mesenteric, or pelvic adenopathy.  -BONES: No fracture or significant bone lesion.  -VASCULATURE: Normal  -OTHER: There is peritoneal carcinomatosis with associated ascites.  Fluid is  most prominent in the upper left abdomen.    ??  IMPRESSION  IMPRESSION:   1.  Primary pancreas tumor.  2.  Peritoneal carcinomatosis and ascites.  3.  Multiple bilateral pulmonary emboli.  ??    ASSESSMENT:  Problem List  Date Reviewed: 05-05-19          Codes Class Noted    Pulmonary emboli [facility]) ICD-10-CM: I26.99  ICD-9-CM: 415.19  05/15/2019        Pancreatic mass ICD-10-CM: K86.89  ICD-9-CM: 577.8  05/15/2019        Leukocytosis ICD-10-CM: [id number]  ICD-9-CM: 288.60  05/15/2019        Polycythemia ICD-10-CM: D75.1  ICD-9-CM: 238.4  05/15/2019        S/P laminectomy ICD-10-CM: [id number]  ICD-9-CM: V45.89  06/11/2018        Spinal stenosis of lumbar region with neurogenic claudication ICD-10-CM: M48.062  ICD-9-CM: 724.03  06/09/2018        Hypertension ICD-10-CM: I10  ICD-9-CM: 401.9  Unknown            77 y.o. M H/O laminectomy developed abd distension, N/V/lose stools, then SOB and admitted from ER, CT showed panc body tumor, massive ascites, peritoneal disease, I reviewed CT personally and discussed with pt and and daughter this wold be most suspicious of pancreatic cancer with peritoneal  metastasis, needs histology diagnosis, need therapeutic paracentesis, possible need for ascites drain till responding to chemo and most likely need port, large B/L PE on [facility], discussed with Dr. Sheria Lang, will follow.    RECOMMENDATIONS:  Pancreatic Mass  -check CEA and Ca 19-9  -will need biopsy  -consulted Dr Sheria Lang for biopsy, staging, and port placement   10/19 CEA 2.5, CA 19-9 2926.  Dr. Sheria Lang with plans for bx, port, and abd pleurx.  10/21 s/p EUS yesterday - pancreatic mass path and ascitic fluid cytology pending.  Dr. Sheria Lang performing diag lap, port placement, and abd pleurx today.  No plans for chemo while IP.  Our office will arrange f/u with Dr. Ronny Flurry upon discharge - Dr. Ronny Flurry will discuss diagnosis and treatment options with pt at visit.    B/L PEs  -Hep gtt  -BLE doppler negative for DVT  10/19 on heparin gtt.  Echo pending.  10/21 No RV strain noted    Leukocytosis  -WBC 17.7, down from 18.4 yesterday  -will monitor  10/19 WBC 18k  10/21 WBC down to 11k    Supportive care per primary team     Thank you for allowing [place] to participate in the care of Matthew Sherman.  Our office will arrange f/u with Dr. Ronny Flurry upon discharge.        Floyde Parkins MD  [facility] [place] [address]  Office : [phone]  Fax : [phone]

## 2019-05-19 NOTE — Other (Signed)
Patient's wife and daughter called from room and updated on patient's condition. No questions or concerns. Patient spoke to wife but is still drowsy from surgery

## 2019-05-19 NOTE — Other (Signed)
TRANSFER - OUT REPORT:    Verbal report given to [place] on Matthew Sherman  being transferred to 321 for routine post - op       Report consisted of patient???s Situation, Background, Assessment and   Recommendations(SBAR).     Information from the following report(s) SBAR, OR Summary, Procedure Summary, Intake/Output and MAR was reviewed with the receiving nurse.    Lines:   Peripheral IV 05/15/19 Right Antecubital (Active)   Site Assessment Clean, dry, & intact 05/19/19 2033   Phlebitis Assessment 0 05/19/19 2033   Infiltration Assessment 0 05/19/19 2033   Dressing Status Clean, dry, & intact;Occlusive 05/19/19 2033   Dressing Type Transparent;Tape 05/19/19 2033   Hub Color/Line Status Pink;Patent 05/19/19 2033   Alcohol Cap Used No 05/19/19 2033       Peripheral IV 05/18/19 Left Forearm (Active)   Site Assessment Clean, dry, & intact 05/19/19 2033   Phlebitis Assessment 0 05/19/19 2033   Infiltration Assessment 0 05/19/19 2033   Dressing Status Clean, dry, & intact;Occlusive 05/19/19 2033   Dressing Type Transparent;Tape 05/19/19 2033   Hub Color/Line Status Pink;Patent 05/19/19 2033   Alcohol Cap Used No 05/19/19 2033       Peripheral IV 05/18/19 Anterior;Distal;Right Forearm (Active)   Site Assessment Clean, dry, & intact 05/19/19 2033   Phlebitis Assessment 0 05/19/19 2033   Infiltration Assessment 0 05/19/19 2033   Dressing Status Clean, dry, & intact;Occlusive 05/19/19 2033   Dressing Type Transparent;Tape 05/19/19 2033   Hub Color/Line Status Pink;Patent 05/19/19 2033   Alcohol Cap Used No 05/19/19 2033        Opportunity for questions and clarification was provided.      Patient transported with:   O2 @ 2 liters    VTE prophylaxis orders have been written for Matthew Sherman.    Patient and family given floor number and nurses name.  Family updated re: pt status after security code verified.

## 2019-05-19 NOTE — Other (Signed)
TRANSFER - IN REPORT:    Verbal report received from [place], [place] on Matthew Sherman  being received from 321 for ordered procedure      Report consisted of patient???s Situation, Background, Assessment and   Recommendations(SBAR).     Information from the following report(s) SBAR and MAR was reviewed with the receiving nurse.    Opportunity for questions and clarification was provided.      Assessment will be completed upon patient???s arrival to unit and care assumed.

## 2019-05-19 NOTE — Progress Notes (Signed)
Per day shift RN, told to ask about heparin drip when patient comes back to floor from surgery. Perfect served on call general surgery, Dr Herbert Deaner.  Conversation as followed below:     From: Jinger Neighbors RE: Matthew Sherman 09-28-1940 RM: 31 Hey, I just received this patient from the PACU. He had LAPAROSCOPY GENERAL DIAGNOSTIC with peritoneal biopsy, INFUSAPORT INSERTION with fluoro with Dr. Sheria Lang today. He has PE and I was told by dayshift nurse to find out when the heparin drip should be restarted. It has expired on my MAR. When would you like to restart the drip? I do not see any orders and it was stopped around 8 am this morning for surgery. Need Callback: NO CALLBACK REQ 3RD FL - TELE  Read 9:21 PM      05/19/19 9:24 PM   Restart with no boluses until morning      Heparin started at 18 units/kg/ hr per MD order at 2151.  Dr. Sheria Lang discontinued heparin drip at 2318. Heparin drip stopped per Dr. Sheria Lang orders.

## 2019-05-19 NOTE — Anesthesia Post-Procedure Evaluation (Signed)
Procedure(s):  LAPAROSCOPY GENERAL DIAGNOSTIC  INFUSAPORT INSERTION.    general    Anesthesia Post Evaluation      Multimodal analgesia: multimodal analgesia used between 6 hours prior to anesthesia start to PACU discharge  Patient location during evaluation: bedside  Patient participation: complete - patient participated  Level of consciousness: awake  Pain management: adequate  Airway patency: patent  Anesthetic complications: no  Cardiovascular status: acceptable and stable  Respiratory status: acceptable and nasal cannula  Hydration status: acceptable  Post anesthesia nausea and vomiting:  none      INITIAL Post-op Vital signs:   Vitals Value Taken Time   BP 136/64 05/19/2019  8:43 PM   Temp 36.8 ??C (98.2 ??F) 05/19/2019  8:33 PM   Pulse 88 05/19/2019  8:45 PM   Resp 18 05/19/2019  8:33 PM   SpO2 95 % 05/19/2019  8:44 PM   Vitals shown include unvalidated device data.

## 2019-05-19 NOTE — Other (Signed)
Bedside report given to Elmyra Ricks, [place]. Opportunity for questions, concerns. Patient involved.

## 2019-05-19 NOTE — ACP (Advance Care Planning) (Signed)
No HCPOA or Advance Directives on file or previously competed.  CM had follow up discussion regarding ACP with pt and spouse this AM. Discussed availability of completing HCPOA while in house with a chaplain. Neither pt or spouse answer. CM verifies with spouse that pt has designated her as HCPOA from our earlier conversation. She is agreeable.   Noted Dr Armida Sans obtained full code status at admission. Pt is scheduled for laparoscopy and port placement today for pancreatic tumor.

## 2019-05-19 NOTE — Progress Notes (Signed)
OT Note:    OT attempted to see patient this afternoon for therapy. Pt off the floor for procedure at this time. OT will re-attempt to see patient at a later date/time.    Thanks,  Sirena Riddle C Carling Liberman, COTA

## 2019-05-19 NOTE — Other (Signed)
Dr. Lynagh at bedside no new orders.

## 2019-05-20 LAB — CBC WITH AUTOMATED DIFF
ABS. BASOPHILS: 0 10*3/uL (ref 0.0–0.2)
ABS. EOSINOPHILS: 0 10*3/uL (ref 0.0–0.8)
ABS. IMM. GRANS.: 0.2 10*3/uL (ref 0.0–0.5)
ABS. LYMPHOCYTES: 0.8 10*3/uL (ref 0.5–4.6)
ABS. MONOCYTES: 1.1 10*3/uL (ref 0.1–1.3)
ABS. NEUTROPHILS: 12.5 10*3/uL — ABNORMAL HIGH (ref 1.7–8.2)
ABSOLUTE NRBC: 0 10*3/uL (ref 0.0–0.2)
BASOPHILS: 0 % (ref 0.0–2.0)
EOSINOPHILS: 0 % — ABNORMAL LOW (ref 0.5–7.8)
HCT: 45 % (ref 41.1–50.3)
HGB: 14.5 g/dL (ref 13.6–17.2)
IMMATURE GRANULOCYTES: 1 % (ref 0.0–5.0)
LYMPHOCYTES: 5 % — ABNORMAL LOW (ref 13–44)
MCH: 27.1 PG (ref 26.1–32.9)
MCHC: 32.2 g/dL (ref 31.4–35.0)
MCV: 84 FL (ref 79.6–97.8)
MONOCYTES: 7 % (ref 4.0–12.0)
MPV: 9.2 FL — ABNORMAL LOW (ref 9.4–12.3)
NEUTROPHILS: 86 % — ABNORMAL HIGH (ref 43–78)
PLATELET: 230 10*3/uL (ref 150–450)
RBC: 5.36 M/uL (ref 4.23–5.6)
RDW: 13.2 % (ref 11.9–14.6)
WBC: 14.6 10*3/uL — ABNORMAL HIGH (ref 4.3–11.1)

## 2019-05-20 LAB — CULTURE, BLOOD
Culture result:: NO GROWTH
Culture result:: NO GROWTH

## 2019-05-20 LAB — PTT: aPTT: 34.2 s (ref 24.1–35.1)

## 2019-05-20 MED ORDER — HYDROMORPHONE (PF) 1 MG/ML IJ SOLN
1 mg/mL | INTRAMUSCULAR | Status: DC | PRN
Start: 2019-05-20 — End: 2019-05-23

## 2019-05-20 MED ORDER — HEPARIN (PORCINE) IN D5W 25,000 UNIT/500 ML IV
25000 unit/500 mL (50 unit/mL) | INTRAVENOUS | Status: DC
Start: 2019-05-20 — End: 2019-05-19

## 2019-05-20 MED ORDER — HEPARIN (PORCINE) IN D5W 25,000 UNIT/500 ML IV
25000 unit/500 mL (50 unit/mL) | INTRAVENOUS | Status: DC
Start: 2019-05-20 — End: 2019-05-22
  Administered 2019-05-20 – 2019-05-22 (×7): via INTRAVENOUS

## 2019-05-20 MED ORDER — HYDROMORPHONE (PF) 1 MG/ML IJ SOLN
1 mg/mL | INTRAMUSCULAR | Status: DC | PRN
Start: 2019-05-20 — End: 2019-05-23
  Administered 2019-05-21: 12:00:00 via INTRAVENOUS

## 2019-05-20 MED FILL — LISINOPRIL 5 MG TAB: 5 mg | ORAL | Qty: 1

## 2019-05-20 MED FILL — HEPARIN (PORCINE) IN D5W 25,000 UNIT/500 ML IV: 25000 unit/500 mL (50 unit/mL) | INTRAVENOUS | Qty: 500

## 2019-05-20 MED FILL — FAMOTIDINE (PF) 20 MG/2 ML IV: 20 mg/2 mL | INTRAVENOUS | Qty: 2

## 2019-05-20 MED FILL — CIPROFLOXACIN 250 MG TAB: 250 mg | ORAL | Qty: 1

## 2019-05-20 MED FILL — LACTATED RINGERS IV: INTRAVENOUS | Qty: 1000

## 2019-05-20 NOTE — Progress Notes (Signed)
Inpatient Hematology / Oncology Daily Progress Note    Reason for Consult:  Pulmonary emboli [facility]) [I26.99]  Referring Physician:  Scherrie Merritts, DO    24 Hour Events:  Afebrile, VSS, on O2 @ 3.5L  S/p Diag lap, port placement, and abd pleurx yesterday  Panc mass bx prelim +carcinoma in situ      ROS:  Constitutional: Negative for fever, chills.  CV: Negative for chest pain, palpitations, edema.  Respiratory: +dypsnea  GI: +N/V    10 point review of systems is otherwise negative with the exception of the elements mentioned above in the HPI.       Allergies   Allergen Reactions   ??? Protonix [Pantoprazole] Nausea and Vomiting     Past Medical History:   Diagnosis Date   ??? Hypertension      Past Surgical History:   Procedure Laterality Date   ??? HX APPENDECTOMY  1956   ??? HX CATARACT REMOVAL Bilateral     right done 2018, left 2002   ??? HX LUMBAR LAMINECTOMY  06/09/2018    lami L2-3. L3-4 4-5 Dr. Bradd Canary   ??? HX TONSIL AND ADENOIDECTOMY       Family History   Problem Relation Age of Onset   ??? Heart Attack Father    ??? No Known Problems Sister    ??? No Known Problems Brother    ??? No Known Problems Maternal Grandmother    ??? No Known Problems Maternal Grandfather    ??? No Known Problems Paternal Grandmother    ??? No Known Problems Paternal Grandfather      Social History     Socioeconomic History   ??? Marital status: MARRIED     Spouse name: Not on file   ??? Number of children: Not on file   ??? Years of education: Not on file   ??? Highest education level: Not on file   Occupational History   ??? Not on file   Social Needs   ??? Financial resource strain: Not on file   ??? Food insecurity     Worry: Not on file     Inability: Not on file   ??? Transportation needs     Medical: Not on file     Non-medical: Not on file   Tobacco Use   ??? Smoking status: Never Smoker   ??? Smokeless tobacco: Never Used   Substance and Sexual Activity   ??? Alcohol use: Never     Frequency: Never   ??? Drug use: Never   ??? Sexual activity: Not Currently      Partners: Female   Lifestyle   ??? Physical activity     Days per week: Not on file     Minutes per session: Not on file   ??? Stress: Not on file   Relationships   ??? Social Product manager on phone: Not on file     Gets together: Not on file     Attends religious service: Not on file     Active member of club or organization: Not on file     Attends meetings of clubs or organizations: Not on file     Relationship status: Not on file   ??? Intimate partner violence     Fear of current or ex partner: Not on file     Emotionally abused: Not on file     Physically abused: Not on file     Forced sexual activity: Not on  file   Other Topics Concern   ??? Not on file   Social History Narrative   ??? Not on file     Current Facility-Administered Medications   Medication Dose Route Frequency Provider Last Rate Last Dose   ??? HYDROmorphone (PF) (DILAUDID) injection 1 mg  1 mg IntraVENous Q4H PRN Fox, Crystal L, NP       ??? lactated Ringers infusion  75 mL/hr IntraVENous CONTINUOUS Fox, Crystal L, NP 75 mL/hr at 05/19/19 1616 75 mL/hr at 05/19/19 1616   ??? HYDROmorphone (PF) (DILAUDID) injection 1 mg  1 mg IntraVENous Q4H PRN Thayer Jew, MD       ??? lisinopriL (PRINIVIL, ZESTRIL) tablet 5 mg  5 mg Oral DAILY Fox, Crystal L, NP   5 mg at 05/18/19 1645   ??? ciprofloxacin HCl (CIPRO) tablet 250 mg  250 mg Oral Q12H Fox, Crystal L, NP   250 mg at 05/19/19 [id number]   ??? prucalopride (MOTEGRITY) tab 2 mg (Patient Supplied)  2 mg Oral DAILY Fox, Crystal L, NP   Stopped at 05/19/19 0900   ??? famotidine (PF) (PEPCID) 20 mg in 0.9% sodium chloride 10 mL injection  20 mg IntraVENous Q12H Fox, Crystal L, NP   20 mg at 05/20/19 1029   ??? oxyCODONE-acetaminophen (PERCOCET) 5-325 mg per tablet 1 Tab  1 Tab Oral Q4H PRN Rachael Darby L, NP   1 Tab at 05/18/19 1803   ??? oxyCODONE-acetaminophen (PERCOCET 10) 10-325 mg per tablet 1 Tab  1 Tab Oral Q4H PRN Rachael Darby L, NP   1 Tab at 05/17/19 2258    ??? sodium chloride (NS) flush 5-40 mL  5-40 mL IntraVENous Q8H Fox, Crystal L, NP   10 mL at 05/20/19 0548   ??? sodium chloride (NS) flush 5-40 mL  5-40 mL IntraVENous PRN Fox, Crystal L, NP       ??? acetaminophen (TYLENOL) tablet 650 mg  650 mg Oral Q6H PRN Fox, Crystal L, NP   650 mg at 05/16/19 1445    Or   ??? acetaminophen (TYLENOL) suppository 650 mg  650 mg Rectal Q6H PRN Fox, Crystal L, NP       ??? polyethylene glycol (MIRALAX) packet 17 g  17 g Oral DAILY PRN Fox, Crystal L, NP       ??? ondansetron (ZOFRAN) injection 4 mg  4 mg IntraVENous Q6H PRN Fox, Crystal L, NP   4 mg at 05/16/19 0818       OBJECTIVE:  Patient Vitals for the past 8 hrs:   BP Temp Pulse Resp SpO2   05/20/19 0740 (!) 113/59 97.9 ??F (36.6 ??C) 78 18 97 %   05/20/19 0546 133/64 97.8 ??F (36.6 ??C) 82 18 99 %     Temp (24hrs), Avg:97.9 ??F (36.6 ??C), Min:97.6 ??F (36.4 ??C), Max:98.3 ??F (36.8 ??C)    10/22 0701 - 10/22 1900  In: -   Out: 175 [Drains:175]    Physical Exam:  Constitutional: Well developed, well nourished male in no acute distress, sitting comfortably in the bedside chair.   HEENT: Normocephalic and atraumatic. Oropharynx is clear, mucous membranes are moist.  Sclerae anicteric. Neck supple without JVD. No thyromegaly present.    Skin Warm and dry.  No bruising and no rash noted.  No erythema.  No pallor.    Respiratory Lungs are clear to auscultation bilaterally without wheezes, rales or rhonchi, normal air exchange without accessory muscle use.    CVS Normal rate, regular rhythm and  normal S1 and S2.  No murmurs, gallops, or rubs.   Abdomen +distended Soft, nontender and, normoactive bowel sounds.  No palpable mass.  No hepatosplenomegaly. Abd pleurx   Neuro Grossly nonfocal with no obvious sensory or motor deficits.   MSK Normal range of motion in general.  No edema and no tenderness.   Psych Appropriate mood and affect.        Labs:    Recent Results (from the past 24 hour(s))   GLUCOSE, POC    Collection Time: 05/19/19 10:48 AM    Result Value Ref Range    Glucose (POC) 108 (H) 65 - 100 mg/dL   CBC WITH AUTOMATED DIFF    Collection Time: 05/20/19  3:13 AM   Result Value Ref Range    WBC 14.6 (H) 4.3 - 11.1 K/uL    RBC 5.36 4.23 - 5.6 M/uL    HGB 14.5 13.6 - 17.2 g/dL    HCT 45.0 41.1 - 50.3 %    MCV 84.0 79.6 - 97.8 FL    MCH 27.1 26.1 - 32.9 PG    MCHC 32.2 31.4 - 35.0 g/dL    RDW 13.2 11.9 - 14.6 %    PLATELET 230 150 - 450 K/uL    MPV 9.2 (L) 9.4 - 12.3 FL    ABSOLUTE NRBC 0.00 0.0 - 0.2 K/uL    DF AUTOMATED      NEUTROPHILS 86 (H) 43 - 78 %    LYMPHOCYTES 5 (L) 13 - 44 %    MONOCYTES 7 4.0 - 12.0 %    EOSINOPHILS 0 (L) 0.5 - 7.8 %    BASOPHILS 0 0.0 - 2.0 %    IMMATURE GRANULOCYTES 1 0.0 - 5.0 %    ABS. NEUTROPHILS 12.5 (H) 1.7 - 8.2 K/UL    ABS. LYMPHOCYTES 0.8 0.5 - 4.6 K/UL    ABS. MONOCYTES 1.1 0.1 - 1.3 K/UL    ABS. EOSINOPHILS 0.0 0.0 - 0.8 K/UL    ABS. BASOPHILS 0.0 0.0 - 0.2 K/UL    ABS. IMM. GRANS. 0.2 0.0 - 0.5 K/UL   PTT    Collection Time: 05/20/19  7:38 AM   Result Value Ref Range    aPTT 34.2 24.1 - 35.1 SEC       Imaging:  CT of the Chest, Abdomen, and Pelvis  ??  INDICATION: Shortness of breath and abdominal pain  ??  Multiple axial images were obtained through the chest, abdomen, and pelvis.   Oral contrast was used for bowel opacification.  170mL of Isovue 370 intravenous  contrast was used for better evaluation of solid organs and vascular structures.   Radiation dose reduction techniques were used for this study.  All CT scans  performed at this facility use one or all of the following: Automated exposure  control, adjustment of the mA and/or kVp according to patient's size, iterative  reconstruction.  ??  COMPARISON: None  ??  FINDINGS:  -LUNGS: Minimal bibasilar infiltrate/atelectasis, left greater than right.  Tiny  bilateral pleural effusions are also present.  No definite pulmonary mass.  -MEDIASTINUM/AXILLA: No significant adenopathy.  -HEART/VESSELS: There are multiple bilateral pulmonary emboli.  Mild   atherosclerosis.  Heart size is normal.  -CHEST WALL: Normal.  ??  -LIVER: Normal in size and appearance.    -GALLBLADDER/BILE DUCTS: Gallbladder is collapsed.  No bile duct dilatation.  -PANCREAS: There is a 2.2 cm mass in the body of the pancreas.  Masses  compressing the splenic  vein.  Pancreatic duct is dilated.  -SPLEEN: Normal.  ??  -ADRENALS:  Normal.  -KIDNEYS/URETERS: 6.7 cm cyst in the lower pole the left kidney.  No  hydronephrosis or significant mass.  -BLADDER: Normal.  -REPRODUCTIVE ORGANS: No pelvic masses.  ??  -BOWEL: Mild distention of the transverse colon.  No inflammatory changes.  -LYMPH NODES: No significant retroperitoneal, mesenteric, or pelvic adenopathy.  -BONES: No fracture or significant bone lesion.  -VASCULATURE: Normal  -OTHER: There is peritoneal carcinomatosis with associated ascites.  Fluid is  most prominent in the upper left abdomen.    ??  IMPRESSION  IMPRESSION:   1.  Primary pancreas tumor.  2.  Peritoneal carcinomatosis and ascites.  3.  Multiple bilateral pulmonary emboli.  ??    ASSESSMENT:  Problem List  Date Reviewed: May 07, 2019          Codes Class Noted    Pulmonary emboli [facility]) ICD-10-CM: I26.99  ICD-9-CM: 415.19  05/15/2019        Pancreatic mass ICD-10-CM: K86.89  ICD-9-CM: 577.8  05/15/2019        Leukocytosis ICD-10-CM: [id number]  ICD-9-CM: 288.60  05/15/2019        Polycythemia ICD-10-CM: D75.1  ICD-9-CM: 238.4  05/15/2019        S/P laminectomy ICD-10-CM: [id number]  ICD-9-CM: V45.89  06/11/2018        Spinal stenosis of lumbar region with neurogenic claudication ICD-10-CM: M48.062  ICD-9-CM: 724.03  06/09/2018        Hypertension ICD-10-CM: I10  ICD-9-CM: 401.9  Unknown            78 y.o. M H/O laminectomy developed abd distension, N/V/lose stools, then SOB and admitted from ER, CT showed panc body tumor, massive ascites, peritoneal disease, I reviewed CT personally and discussed with pt and and daughter this wold be most suspicious of pancreatic cancer with peritoneal  metastasis, needs histology diagnosis, need therapeutic paracentesis, possible need for ascites drain till responding to chemo and most likely need port, large B/L PE on [facility], discussed with Dr. Sheria Lang, will follow.    RECOMMENDATIONS:  Pancreatic Mass / Pancreatic cancer  -check CEA and Ca 19-9  -will need biopsy  -consulted Dr Sheria Lang for biopsy, staging, and port placement   10/19 CEA 2.5, CA 19-9 2926.  Dr. Sheria Lang with plans for bx, port, and abd pleurx.  10/21 s/p EUS yesterday - pancreatic mass path and ascitic fluid cytology pending.  Dr. Sheria Lang performing diag lap, port placement, and abd pleurx today.  No plans for chemo while IP.  Our office will arrange f/u with Dr. Ronny Flurry upon discharge - Dr. Ronny Flurry will discuss diagnosis and treatment options with pt at visit.  10/22 Panc mass prelim +carcinoma in situ.  Ascitic fluid cytology pending.  S/p diag lap with port placement and abd pleurx yesterday - pancreatic cancer with very extensive peritoneal carcinomatosis, concerning for SBO and localized perforation/peritonitis in pelvis.    B/L PEs  -Hep gtt  -BLE doppler negative for DVT  10/19 on heparin gtt.  Echo pending.  10/21 No RV strain noted    Leukocytosis  -WBC 17.7, down from 18.4 yesterday  -will monitor  10/19 WBC 18k  10/21 WBC down to 11k  10/22 WBC 14k.  On Cipro/LVQ.    Supportive care per primary team     Thank you for allowing [place] to participate in the care of Mr. Lanigan.  We will sign off; please do not hesitate to call with any questions.  Our  office will arrange f/u with Dr. Ronny Flurry upon discharge.          Floyde Parkins MD    [facility] [place] [address]  Office : [phone]  Fax : [phone]

## 2019-05-20 NOTE — Other (Signed)
Bedside and Verbal shift change report given to Bess, RN (oncoming nurse) by  (offgoing nurse). Report included the following information SBAR, Kardex, Intake/Output, MAR and Recent Results.

## 2019-05-20 NOTE — Progress Notes (Signed)
PT note:      Pt declined PT this morning. Will check back on pt later as schedule allows.     Doreene Adas, PTA

## 2019-05-20 NOTE — Consults (Signed)
Palliative Care    Patient: Matthew Sherman MRN: [id number]  SSN: [id number]    Date of Birth: June 02, 1941  Age: 78 y.o.  Sex: male       Date of Request: 05/20/2019  Date of Consult:  05/20/2019  Reason for Consult:  advanced care plan planning/end of life  Requesting Physician: Rachael Darby, NP     Assessment/Plan:     Principal Diagnosis:    Debility, Unspecified  R53.81    Additional Diagnoses:   ?? Fatigue, Lethargy  R53.83  ?? Pain, abdomen  R10.9  ?? Counseling, Encounter for Medical Advice  Z71.9  ?? Encounter for Palliative Care  Z51.5    Palliative Performance Scale (PPS):  PPS: 50    Medical Decision Making:   Reviewed and summarized notes from admission to present   Discussed case with appropriate providers  Reviewed laboratory and x-ray data from admission to present     Pt resting in bed, no distress noted.  Wife at bedside.  Introduced role of PC and reviewed events of hospitalization.  Both have good understanding of pt's condition, and concerns for pancreatic cancer.  They have spoken with oncology, and are awaiting final pathology before discussing treatment options.  Counseled that PC would assist with goals related to this.  Pt denies pain and other symptoms at present. Of noted, cytology is back from his ascites with cells suspicious for malignancy.  Will continue to follow.     Will discuss findings with members of the interdisciplinary team.      Thank you for this referral.          .    Subjective:     History obtained from:  Patient, Family and Chart    Chief Complaint: Pancreatic mass, probable peritoneal carcinomatosis   History of Present Illness:  Matthew Sherman is a 78 yo caucasian male with PMH of HTN, who presented to the ER from home on 05/15/2019 with c/o progressive dyspnea for several days.  He had recently been evaluated by GI for abdominal distention, anorexia, and abdominal pain, and was scheduled for a colonoscopy in the near future.  Work up in the ED  revealed a pancreatic mass with likely peritoneal carcinomatosis, and bilateral PE's on CT scan.  Pt was admitted for further management.  He was evaluated by GI, General surgery, and Oncology.   Pt underwent EUS on 10/20, confirming a solid mass in the body of the pancreas.  Biopsies were obtained.  Pt underwent laparoscopy with peritoneal biopsy and port placement on 10/21.  Pathology is pending.       Advance Directive: No       Code Status:  Full Code            Health Care Power of Attorney: No - Patient does not have a [place].    Past Medical History:   Diagnosis Date   ??? Hypertension       Past Surgical History:   Procedure Laterality Date   ??? HX APPENDECTOMY  1956   ??? HX CATARACT REMOVAL Bilateral     right done 2018, left 2002   ??? HX LUMBAR LAMINECTOMY  06/09/2018    lami L2-3. L3-4 4-5 Dr. Bradd Canary   ??? HX TONSIL AND ADENOIDECTOMY       Family History   Problem Relation Age of Onset   ??? Heart Attack Father    ??? No Known Problems Sister    ??? No  Known Problems Brother    ??? No Known Problems Maternal Grandmother    ??? No Known Problems Maternal Grandfather    ??? No Known Problems Paternal Grandmother    ??? No Known Problems Paternal Grandfather       Social History     Tobacco Use   ??? Smoking status: Never Smoker   ??? Smokeless tobacco: Never Used   Substance Use Topics   ??? Alcohol use: Never     Frequency: Never     Prior to Admission medications    Medication Sig Start Date End Date Taking? Authorizing Provider   lisinopriL (PRINIVIL, ZESTRIL) 10 mg tablet Take 1 Tab by mouth daily. 04/29/19  Yes Fischer, Shanon Brow, PA-C   prucalopride 2 mg tab Take 2 mg by mouth daily.    Provider, Historical       Allergies   Allergen Reactions   ??? Protonix [Pantoprazole] Nausea and Vomiting        Review of Systems:  A comprehensive review of systems was negative except for:   Constitutional: Positive for fatigue .  Gastrointestinal: Positive for intermittent abdominal pain      Objective:     Visit Vitals   BP (!) 113/59   Pulse 78   Temp 97.9 ??F (36.6 ??C)   Resp 18   Ht 6' (1.829 m)   Wt 214 lb (97.1 kg)   SpO2 97%   BMI 29.02 kg/m??        Physical Exam:    General:  Cooperative. No acute distress.   Eyes:  Conjunctivae/corneas clear    Nose: Nares normal. Septum midline.   Neck: Supple, symmetrical, trachea midline   Lungs:   Clear to auscultation bilaterally, unlabored   Heart:  Regular rate and rhythm   Abdomen:   Soft, non-tender, distended. JP drain    Extremities: Normal, atraumatic, no cyanosis or edema   Skin: Skin color, texture, turgor normal    Neurologic: Nonfocal   Psych: Alert and oriented      Assessment:     Hospital Problems  Date Reviewed: 2019-04-29          Codes Class Noted POA    Pulmonary emboli ([place]) ICD-10-CM: I26.99  ICD-9-CM: 415.19  05/15/2019 Yes        Pancreatic mass ICD-10-CM: K86.89  ICD-9-CM: 577.8  05/15/2019 Yes        Leukocytosis ICD-10-CM: D72.829  ICD-9-CM: 288.60  05/15/2019 Yes        Polycythemia ICD-10-CM: D75.1  ICD-9-CM: 238.4  05/15/2019 Yes        Hypertension ICD-10-CM: [id number]  ICD-9-CM: 401.9  Unknown Yes              Signed By: Scherrie Bateman, NP     May 20, 2019

## 2019-05-20 NOTE — Progress Notes (Signed)
Progress Note    Patient: Matthew Sherman MRN: [id number]  SSN: [id number]    Date of Birth: 06/06/1941  Age: 78 y.o.  Sex: male      Admit Date: 05/15/2019    LOS: 5 days     Subjective:   F/U pancreatic tumor    "78 yo male with PMH of HTN and Lumbar laminectomy who is evaluated with progressive shortness of breath and found with bilateral PE and pancreatitic tumor.  CT Chest/ AP done in the ED and shows primary pancreas tumor, peritoneal carcinomatosis and bilateral PE. He has been placed on IV heparin. Duplex LE no DVT." GI, surgery Dr. Sheria Lang and oncology consulted. EGD and EUS with FNA performed on 10/20. CEA 2.5. [place]er antigen 19-9 2,926.2. ECHO with EF 55%, wall thickness mildly increased.  S/p diagnostic laparoscopy with peritoneal biopsy and placement of pelvic drain on 10/21. Findings showed Pancreatic cancer with extensive peritoneal carcinomatosis concerning for small bowel obstruction and localized perforation/peritonitis in pelvis. Palliative care consulted.     No chest pain. No SOB. Occasional abdominal pain at upper quadrants. Feels as if his abdomen is larger.   Current Facility-Administered Medications   Medication Dose Route Frequency   ??? HYDROmorphone (PF) (DILAUDID) injection 1 mg  1 mg IntraVENous Q4H PRN   ??? heparin 25,000 units in dextrose 500 mL infusion  500 Units/hr IntraVENous CONTINUOUS   ??? lactated Ringers infusion  75 mL/hr IntraVENous CONTINUOUS   ??? HYDROmorphone (PF) (DILAUDID) injection 1 mg  1 mg IntraVENous Q4H PRN   ??? lisinopriL (PRINIVIL, ZESTRIL) tablet 5 mg  5 mg Oral DAILY   ??? ciprofloxacin HCl (CIPRO) tablet 250 mg  250 mg Oral Q12H   ??? prucalopride (MOTEGRITY) tab 2 mg (Patient Supplied)  2 mg Oral DAILY   ??? famotidine (PF) (PEPCID) 20 mg in 0.9% sodium chloride 10 mL injection  20 mg IntraVENous Q12H   ??? oxyCODONE-acetaminophen (PERCOCET) 5-325 mg per tablet 1 Tab  1 Tab Oral Q4H PRN    ??? oxyCODONE-acetaminophen (PERCOCET 10) 10-325 mg per tablet 1 Tab  1 Tab Oral Q4H PRN   ??? sodium chloride (NS) flush 5-40 mL  5-40 mL IntraVENous Q8H   ??? sodium chloride (NS) flush 5-40 mL  5-40 mL IntraVENous PRN   ??? acetaminophen (TYLENOL) tablet 650 mg  650 mg Oral Q6H PRN    Or   ??? acetaminophen (TYLENOL) suppository 650 mg  650 mg Rectal Q6H PRN   ??? polyethylene glycol (MIRALAX) packet 17 g  17 g Oral DAILY PRN   ??? ondansetron (ZOFRAN) injection 4 mg  4 mg IntraVENous Q6H PRN       Objective:     Vitals:    05/19/19 2104 05/20/19 0108 05/20/19 0546 05/20/19 0740   BP: 133/75 130/73 133/64 (!) 113/59   Pulse: 87 85 82 78   Resp: 18 18 18 18    Temp: 97.6 ??F (36.4 ??C) 97.9 ??F (36.6 ??C) 97.8 ??F (36.6 ??C) 97.9 ??F (36.6 ??C)   SpO2: 97% 96% 99% 97%   Weight:       Height:             Intake and Output:  Current Shift: 10/22 0701 - 10/22 1900  In: -   Out: 295 [Drains:295]  Last three shifts: 10/20 1901 - 10/22 0700  In: 100 [I.V.:100]  Out: 1150 [Urine:575; Drains:575]    Physical Exam:   General:  Alert, cooperative, frail appearing.    Eyes:  Conjunctivae/corneas clear.    Ears:  Normal TMs and external ear canals both ears.   Nose: Nares normal. Septum midline.   Mouth/Throat: Lips, mucosa, and tongue normal.    Neck:  no JVD.   Back:   deferred   Lungs:   Clear to auscultation bilaterally but limited breathe sounds.   Heart:  Regular rate and rhythm, S1, S2 normal   Abdomen:   Distended, mild tenderness to palpation throughout. +BS that are slow. JP drain with serosanguineous fluid.    Extremities: Extremities normal, atraumatic, no cyanosis or edema.   Pulses: 2+ and symmetric all extremities.   Skin: Skin color, texture, turgor normal. No rashes or lesions   Lymph nodes: Cervical, supraclavicular, and axillary nodes normal.   Neurologic: CNII-XII intact.       Lab/Data Review:    Recent Results (from the past 24 hour(s))   CBC WITH AUTOMATED DIFF    Collection Time: 05/20/19  3:13 AM   Result Value Ref Range     WBC 14.6 (H) 4.3 - 11.1 K/uL    RBC 5.36 4.23 - 5.6 M/uL    HGB 14.5 13.6 - 17.2 g/dL    HCT 45.0 41.1 - 50.3 %    MCV 84.0 79.6 - 97.8 FL    MCH 27.1 26.1 - 32.9 PG    MCHC 32.2 31.4 - 35.0 g/dL    RDW 13.2 11.9 - 14.6 %    PLATELET 230 150 - 450 K/uL    MPV 9.2 (L) 9.4 - 12.3 FL    ABSOLUTE NRBC 0.00 0.0 - 0.2 K/uL    DF AUTOMATED      NEUTROPHILS 86 (H) 43 - 78 %    LYMPHOCYTES 5 (L) 13 - 44 %    MONOCYTES 7 4.0 - 12.0 %    EOSINOPHILS 0 (L) 0.5 - 7.8 %    BASOPHILS 0 0.0 - 2.0 %    IMMATURE GRANULOCYTES 1 0.0 - 5.0 %    ABS. NEUTROPHILS 12.5 (H) 1.7 - 8.2 K/UL    ABS. LYMPHOCYTES 0.8 0.5 - 4.6 K/UL    ABS. MONOCYTES 1.1 0.1 - 1.3 K/UL    ABS. EOSINOPHILS 0.0 0.0 - 0.8 K/UL    ABS. BASOPHILS 0.0 0.0 - 0.2 K/UL    ABS. IMM. GRANS. 0.2 0.0 - 0.5 K/UL   PTT    Collection Time: 05/20/19  7:38 AM   Result Value Ref Range    aPTT 34.2 24.1 - 35.1 SEC       Assessment/ Plan:     Active Problems:    Hypertension ()      Pulmonary emboli ([place]) (05/15/2019)      Pancreatic mass (05/15/2019)      Leukocytosis (05/15/2019)      Polycythemia (05/15/2019)    Pancreatic mass- S/p EGD/EUS with biopsy on 10/20. Hematology will see as outpatient follow up with Dr. Ronny Flurry.  S/p diagnostic laparoscopy with peritoneal biopsy and placement of pelvic drain on 10/21. Findings showed Pancreatic cancer with extensive peritoneal carcinomatosis concerning for small bowel obstruction and localized perforation/peritonitis in pelvis. Palliative care consulted.   Will reach out to oncology tomorrow to see if they can speak with patient about prognosis.     PE - heparin drip for the next few days since small perforations seen during surgery.     Constipation-  Motegrity     HTN- Hold ACE since BP borderline low.     General surgery okay for clear liquid  diet today.     DVT prophylaxis - heparin drip   Signed By: Scherrie Merritts, DO     May 20, 2019

## 2019-05-20 NOTE — Other (Signed)
Bedside and verbal report received from [place], Rn  Pt resting quietly with no distress noted. JP drain emptied and recharged.

## 2019-05-20 NOTE — Progress Notes (Signed)
Problem: Falls - Risk of  Goal: *Absence of Falls  Description: Document Matthew Sherman Fall Risk and appropriate interventions in the flowsheet.  Outcome: Progressing Towards Goal  Note: Fall Risk Interventions:  Mobility Interventions: Bed/chair exit alarm, Communicate number of staff needed for ambulation/transfer, OT consult for ADLs, Patient to call before getting OOB    Mentation Interventions: Adequate sleep, hydration, pain control, Bed/chair exit alarm, Door open when patient unattended, Update white board, Toileting rounds, Room close to nurse's station, Reorient patient, More frequent rounding, Increase mobility    Medication Interventions: Evaluate medications/consider consulting pharmacy, Patient to call before getting OOB, Teach patient to arise slowly, Assess postural VS orthostatic hypotension    Elimination Interventions: Call light in reach, Bed/chair exit alarm, Patient to call for help with toileting needs, Stay With Me (per policy), Urinal in reach, Toileting schedule/hourly rounds    History of Falls Interventions: Bed/chair exit alarm, Consult care management for discharge planning, Door open when patient unattended

## 2019-05-20 NOTE — Other (Signed)
Bedside and verbal report given to Nicole Rn

## 2019-05-20 NOTE — Progress Notes (Signed)
H&P/Consult Note/Progress Note/Office Note:   Matthew Sherman  MRN: [id number]  DOB:Dec 18, 1940  Age:78 y.o.     General Surgery Consult ordered by: Delbert Phenix, NP  Reason for General Surgery Consult: Pancreatic Mass    HPI: Matthew Sherman is a 78 y.o. male with PMH of HTN, who is seen in consultation at the request of Delbert Phenix, NP for pancreatic mass noted on 05/15/19 CT. Pt was admitted 10/17 for progressive SOB. Pt reports he has been experiencing intermittent worsening abdominal pain, distention, and anorexia over the past year.   Pt also c/o N/V this am.     05/15/19 CT Chest/Abd/Pelvis  IMPRESSION:   1.  Primary pancreas tumor.  2.  Peritoneal carcinomatosis and ascites.  3.  Multiple bilateral pulmonary emboli.    05/17/2019  Pt denies abdominal pain this AM.  Appears SOB.     05/18/19 In bed resting comfortably. No complaints. Will plan surgery tomorrow.     05/20/2019 Patient sleeping- JP maintained with 515m output documented.  Abdomen distended. Steri strips maintained. Will restart heparin gtt and will consult palliative care.  He has large amount of disease burden.  Unable to place PleurX yesterday related to some infected component noted in abdomen.      Past Medical History:   Diagnosis Date   ??? Hypertension      Past Surgical History:   Procedure Laterality Date   ??? HX APPENDECTOMY  1956   ??? HX CATARACT REMOVAL Bilateral     right done 2018, left 2002   ??? HX LUMBAR LAMINECTOMY  06/09/2018    lami L2-3. L3-4 4-5 Dr. MBradd Canary  ??? HX TONSIL AND ADENOIDECTOMY       Current Facility-Administered Medications   Medication Dose Route Frequency   ??? HYDROmorphone (PF) (DILAUDID) injection 1 mg  1 mg IntraVENous Q4H PRN   ??? heparin 25,000 units in dextrose 500 mL infusion  500 Units/hr IntraVENous CONTINUOUS   ??? lactated Ringers infusion  75 mL/hr IntraVENous CONTINUOUS   ??? HYDROmorphone (PF) (DILAUDID) injection 1 mg  1 mg IntraVENous Q4H PRN    ??? lisinopriL (PRINIVIL, ZESTRIL) tablet 5 mg  5 mg Oral DAILY   ??? ciprofloxacin HCl (CIPRO) tablet 250 mg  250 mg Oral Q12H   ??? prucalopride (MOTEGRITY) tab 2 mg (Patient Supplied)  2 mg Oral DAILY   ??? famotidine (PF) (PEPCID) 20 mg in 0.9% sodium chloride 10 mL injection  20 mg IntraVENous Q12H   ??? oxyCODONE-acetaminophen (PERCOCET) 5-325 mg per tablet 1 Tab  1 Tab Oral Q4H PRN   ??? oxyCODONE-acetaminophen (PERCOCET 10) 10-325 mg per tablet 1 Tab  1 Tab Oral Q4H PRN   ??? sodium chloride (NS) flush 5-40 mL  5-40 mL IntraVENous Q8H   ??? sodium chloride (NS) flush 5-40 mL  5-40 mL IntraVENous PRN   ??? acetaminophen (TYLENOL) tablet 650 mg  650 mg Oral Q6H PRN    Or   ??? acetaminophen (TYLENOL) suppository 650 mg  650 mg Rectal Q6H PRN   ??? polyethylene glycol (MIRALAX) packet 17 g  17 g Oral DAILY PRN   ??? ondansetron (ZOFRAN) injection 4 mg  4 mg IntraVENous Q6H PRN     Protonix [pantoprazole]  Social History     Socioeconomic History   ??? Marital status: MARRIED     Spouse name: Not on file   ??? Number of children: Not on file   ??? Years of education: Not on file   ???  Highest education level: Not on file   Tobacco Use   ??? Smoking status: Never Smoker   ??? Smokeless tobacco: Never Used   Substance and Sexual Activity   ??? Alcohol use: Never     Frequency: Never   ??? Drug use: Never   ??? Sexual activity: Not Currently     Partners: Female     Social History     Tobacco Use   Smoking Status Never Smoker   Smokeless Tobacco Never Used     Family History   Problem Relation Age of Onset   ??? Heart Attack Father    ??? No Known Problems Sister    ??? No Known Problems Brother    ??? No Known Problems Maternal Grandmother    ??? No Known Problems Maternal Grandfather    ??? No Known Problems Paternal Grandmother    ??? No Known Problems Paternal Grandfather      ROS: Comprehensive review of systems was otherwise unremarkable except as noted above. + SOB    Physical Exam:   Visit Vitals  BP (!) 113/59   Pulse 78   Temp 97.9 ??F (36.6 ??C)   Resp 18    Ht 6' (1.829 m)   Wt 214 lb (97.1 kg)   SpO2 97%   BMI 29.02 kg/m??     Vitals:    05/19/19 2104 05/20/19 0108 05/20/19 0546 05/20/19 0740   BP: 133/75 130/73 133/64 (!) 113/59   Pulse: 87 85 82 78   Resp: '18 18 18 18   ' Temp: 97.6 ??F (36.4 ??C) 97.9 ??F (36.6 ??C) 97.8 ??F (36.6 ??C) 97.9 ??F (36.6 ??C)   SpO2: 97% 96% 99% 97%   Weight:       Height:         10/22 0701 - 10/22 1900  In: -   Out: 220 [Drains:220]  10/20 1901 - 10/22 0700  In: 100 [I.V.:100]  Out: 1150 [Urine:575; Drains:575]    Constitutional: Alert, oriented, cooperative   Eyes:Sclera are clear. EOMs intact  ENMT: no external lesions gross hearing normal; no obvious neck masses, no ear or lip lesions, nares normal  CV: RRR. Normal perfusion  Resp: No JVD.  Breathing is slightly labored; no audible wheezing.    GI:  soft, distended, blake drain maintained  Musculoskeletal: No edema or cyanosis.   Neuro: no focal deficits  Psychiatric: normal affect and mood, no memory impairment    Recent vitals (if inpt):  Patient Vitals for the past 24 hrs:   BP Temp Pulse Resp SpO2   05/20/19 0740 (!) 113/59 97.9 ??F (36.6 ??C) 78 18 97 %   05/20/19 0546 133/64 97.8 ??F (36.6 ??C) 82 18 99 %   05/20/19 0108 130/73 97.9 ??F (36.6 ??C) 85 18 96 %   05/19/19 2104 133/75 97.6 ??F (36.4 ??C) 87 18 97 %   05/19/19 2043 136/64 ??? 89 ??? 95 %   05/19/19 2038 134/63 ??? 89 ??? 95 %   05/19/19 2033 138/66 98.2 ??F (36.8 ??C) 90 18 96 %   05/19/19 2028 (!) 126/59 ??? 81 18 93 %   05/19/19 2023 137/63 ??? 91 16 95 %   05/19/19 2015 (!) 140/64 ??? 89 18 90 %   05/19/19 2010 (!) 126/52 ??? 80 18 92 %   05/19/19 2004 134/65 ??? 72 18 91 %   05/19/19 1956 118/64 ??? 90 18 92 %   05/19/19 1954 118/64 ??? 87 18 94 %  05/19/19 1950 118/64 ??? 90 16 98 %   05/19/19 1946 129/72 98.3 ??F (36.8 ??C) 78 15 100 %   05/19/19 1554 (!) 147/70 97.8 ??F (36.6 ??C) 93 28 96 %   05/19/19 1442 129/77 97.7 ??F (36.5 ??C) 84 20 96 %       Labs:  Recent Labs     05/20/19  0738 05/20/19  0313  05/18/19  0332   WBC  --  14.6*   < > 13.6*    HGB  --  14.5   < > 14.5   PLT  --  230   < > 236   NA  --   --   --  136   K  --   --   --  4.0   CL  --   --   --  106   CO2  --   --   --  25   BUN  --   --   --  20   CREA  --   --   --  0.82   GLU  --   --   --  98   PTP  --   --   --  16.6*   INR  --   --   --  1.3   APTT 34.2  --   --   --    TBILI  --   --   --  0.4   ALT  --   --   --  48   AP  --   --   --  73    < > = values in this interval not displayed.       Lab Results   Component Value Date/Time    WBC 14.6 (H) 05/20/2019 03:13 AM    HGB 14.5 05/20/2019 03:13 AM    PLATELET 230 05/20/2019 03:13 AM    Sodium 136 05/18/2019 03:32 AM    Potassium 4.0 05/18/2019 03:32 AM    Chloride 106 05/18/2019 03:32 AM    CO2 25 05/18/2019 03:32 AM    BUN 20 05/18/2019 03:32 AM    Creatinine 0.82 05/18/2019 03:32 AM    Glucose 98 05/18/2019 03:32 AM    INR 1.3 05/18/2019 03:32 AM    aPTT 34.2 05/20/2019 07:38 AM    Bilirubin, total 0.4 05/18/2019 03:32 AM    Bilirubin, direct 0.2 05/16/2019 01:04 AM    ALT (SGPT) 48 05/18/2019 03:32 AM    Alk. phosphatase 73 05/18/2019 03:32 AM    Lipase 42 (L) 05/15/2019 02:16 PM       CT Results  (Last 48 hours)    None        chest X-ray      I reviewed recent labs, recent radiologic studies, and pertinent records including other doctor notes if needed.    I independently reviewed radiology images for studies I described above or studies I have ordered.   Admission date (for inpatients): 05/15/2019   * No surgery found *  Procedure(s):  LAPAROSCOPY GENERAL DIAGNOSTIC  INFUSAPORT INSERTION    ASSESSMENT/PLAN:  Problem List  Date Reviewed: 05-08-2019          Codes Class Noted    Pulmonary emboli ([place]) ICD-10-CM: I26.99  ICD-9-CM: 415.19  05/15/2019        Pancreatic mass ICD-10-CM: Y40.34  ICD-9-CM: 577.8  05/15/2019        Leukocytosis ICD-10-CM: V42.595  ICD-9-CM: 288.60  05/15/2019  Polycythemia ICD-10-CM: D75.1  ICD-9-CM: 238.4  05/15/2019        S/P laminectomy ICD-10-CM: N62.952  ICD-9-CM: V45.89  06/11/2018         Spinal stenosis of lumbar region with neurogenic claudication ICD-10-CM: M48.062  ICD-9-CM: 724.03  06/09/2018        Hypertension ICD-10-CM: I10  ICD-9-CM: 401.9  Unknown            Active Problems:    Hypertension ()      Pulmonary emboli ([place]) (05/15/2019)      Pancreatic mass (05/15/2019)      Leukocytosis (05/15/2019)      Polycythemia (05/15/2019)         Plan:  Care management per Hospitalist  --IV Heparin gtt for Bilateral PE- restarted today per Dr. Sheria Lang at 500u/hour will check PTT in AM and adjust as needed  Maintain blake drain  Consult Palliative care-  diffuse peritoneal carcinomatosis with moderate amount of ascites.  There are several small bowel loops in the pelvis which were apparently infiltrated with inflammatory changes around.  This could represent bowel invasion with contained perforation, so PleurX catheter was not placed, instead a temporary drain which is #19 Blake drain was left in pelvis.    Hanley Hays, NP

## 2019-05-20 NOTE — Other (Signed)
Bedside and Verbal shift change report given to  (oncoming nurse) by Bess, RN (offgoing nurse). Report included the following information SBAR, Kardex, Intake/Output, MAR and Recent Results.

## 2019-05-21 LAB — CBC WITH AUTOMATED DIFF
ABS. BASOPHILS: 0.1 10*3/uL (ref 0.0–0.2)
ABS. EOSINOPHILS: 0.1 10*3/uL (ref 0.0–0.8)
ABS. IMM. GRANS.: 0.2 10*3/uL (ref 0.0–0.5)
ABS. LYMPHOCYTES: 1.3 10*3/uL (ref 0.5–4.6)
ABS. MONOCYTES: 1.5 10*3/uL — ABNORMAL HIGH (ref 0.1–1.3)
ABS. NEUTROPHILS: 8.4 10*3/uL — ABNORMAL HIGH (ref 1.7–8.2)
ABSOLUTE NRBC: 0 10*3/uL (ref 0.0–0.2)
BASOPHILS: 1 % (ref 0.0–2.0)
EOSINOPHILS: 1 % (ref 0.5–7.8)
HCT: 42.7 % (ref 41.1–50.3)
HGB: 14 g/dL (ref 13.6–17.2)
IMMATURE GRANULOCYTES: 2 % (ref 0.0–5.0)
LYMPHOCYTES: 11 % — ABNORMAL LOW (ref 13–44)
MCH: 27.6 PG (ref 26.1–32.9)
MCHC: 32.8 g/dL (ref 31.4–35.0)
MCV: 84.2 FL (ref 79.6–97.8)
MONOCYTES: 13 % — ABNORMAL HIGH (ref 4.0–12.0)
MPV: 9.3 FL — ABNORMAL LOW (ref 9.4–12.3)
NEUTROPHILS: 73 % (ref 43–78)
PLATELET: 244 10*3/uL (ref 150–450)
RBC: 5.07 M/uL (ref 4.23–5.6)
RDW: 13.4 % (ref 11.9–14.6)
WBC: 11.5 10*3/uL — ABNORMAL HIGH (ref 4.3–11.1)

## 2019-05-21 LAB — METABOLIC PANEL, BASIC
Anion gap: 6 mmol/L — ABNORMAL LOW (ref 7–16)
BUN: 16 MG/DL (ref 8–23)
CO2: 26 mmol/L (ref 21–32)
Calcium: 8.1 MG/DL — ABNORMAL LOW (ref 8.3–10.4)
Chloride: 107 mmol/L (ref 98–107)
Creatinine: 0.8 MG/DL (ref 0.8–1.5)
GFR est AA: >60 mL/min/{1.73_m2} (ref >60)
GFR est non-AA: >60 mL/min/{1.73_m2} (ref >60)
Glucose: 99 mg/dL (ref 65–100)
Potassium: 4.1 mmol/L (ref 3.5–5.1)
Sodium: 139 mmol/L (ref 136–145)

## 2019-05-21 LAB — PTT: aPTT: 42.7 s — ABNORMAL HIGH (ref 24.1–35.1)

## 2019-05-21 MED FILL — CIPROFLOXACIN 250 MG TAB: 250 mg | ORAL | Qty: 1

## 2019-05-21 MED FILL — MAPAP (ACETAMINOPHEN) 325 MG TABLET: 325 mg | ORAL | Qty: 2

## 2019-05-21 MED FILL — FAMOTIDINE (PF) 20 MG/2 ML IV: 20 mg/2 mL | INTRAVENOUS | Qty: 2

## 2019-05-21 MED FILL — HYDROMORPHONE (PF) 1 MG/ML IJ SOLN: 1 mg/mL | INTRAMUSCULAR | Qty: 1

## 2019-05-21 MED FILL — OXYCODONE-ACETAMINOPHEN 5 MG-325 MG TAB: 5-325 mg | ORAL | Qty: 1

## 2019-05-21 NOTE — Progress Notes (Signed)
Palliative Care Progress Note    Patient: Matthew Sherman MRN: [id number]  SSN: [id number]    Date of Birth: Sep 24, 1940  Age: 78 y.o.  Sex: male       Assessment/Plan:     Chief Complaint/Interval History: reports mild abdominal discomfort       Principal Diagnosis:    ?? Pain, abdomen  R10.9    Additional Diagnoses:   ?? Anorexia  R63.0  ?? Ascites  R18.8  ?? Debility, Unspecified  R53.81  ?? Fatigue, Lethargy  R53.83  ?? Frailty  R54  ?? Counseling, Encounter for Medical Advice  Z71.9  ?? Encounter for Palliative Care  Z51.5    Palliative Performance Scale (PPS)  PPS: 50    Medical Decision Making:   Reviewed and summarized notes over last 24 hours   Discussed case with appropriate providers  Reviewed laboratory and x-ray data over last 24 hours     Pt siting in recliner, no distress noted.  Wife at side.  Pt reports mild abdominal discomfort due to distention.  Wife with questions about when he will be discharged.  Counseled that pt would need to be cleared by general surgery before he could be discharged.  He worked with PT today, and no further rehab is needed after discharge.  Counseled that we would follow him at [facility] for ongoing symptom management and goals of care.  Wife indicates today that pt will pursue treatment if it is offered.  Provided support.  Will continue to follow.      Will discuss findings with members of the interdisciplinary team.         More than 50% of this 35 minute visit was spent counseling and coordination of care as outlined above.    Subjective:     Review of Systems:  A comprehensive review of systems was negative except for:   Constitutional: Positive for fatigue.  Gastrointestinal: Positive for abdominal distention/discomfort, anorexia      Objective:     Visit Vitals  BP (!) 106/53   Pulse 78   Temp 97.7 ??F (36.5 ??C)   Resp 20   Ht 6' (1.829 m)   Wt 214 lb (97.1 kg)   SpO2 98%   BMI 29.02 kg/m??       Physical Exam:    General:  Cooperative. Debilitated. No acute distress.    Eyes:  Conjunctivae/corneas clear    Nose: Nares normal. Septum midline.   Neck: Supple, symmetrical, trachea midline   Lungs:   Clear to auscultation bilaterally, unlabored   Heart:  Regular rate and rhythm   Abdomen:   Soft, non-tender, distended.     Extremities: Normal, atraumatic, no cyanosis or edema   Skin: Skin color, texture, turgor normal.   Neurologic: Nonfocal   Psych: Alert and oriented     Signed By: Scherrie Bateman, NP     May 21, 2019

## 2019-05-21 NOTE — Other (Signed)
Bedside and Verbal shift change report given to Bess, RN (oncoming nurse) by  (offgoing nurse). Report included the following information SBAR, Kardex, Intake/Output, MAR and Recent Results.

## 2019-05-21 NOTE — Other (Signed)
Bedside and verbal report given to Lena, Rn

## 2019-05-21 NOTE — Progress Notes (Signed)
Late entry    Spiritual Care Visit, initial visit.    Visited with patient at bedside.    Prayed for patient's healing and health.    Visit by Kirstie Peri Shon Baton, Psychologist, educational. M.Ed., Th.B., B.A.

## 2019-05-21 NOTE — Other (Signed)
Bedside and Verbal shift change report given to myself (oncoming nurse) by Bess, RN (offgoing nurse). Report included the following information SBAR, Kardex, MAR and Recent Results.

## 2019-05-21 NOTE — Progress Notes (Signed)
Care Management Interventions  PCP Verified by CM: Yes(Dr Pruit)  Last Visit to PCP: 04/28/19  Mode of Transport at Discharge: Other (see comment)([place], Lurlean [place]  [phone]   [phone] )  Transition of Care Consult (CM Consult): Discharge Planning  Current Support Network: Lives with Spouse, Own Home  Confirm Follow Up Transport: Family  Discharge Location  Discharge Placement: Unable to determine at this time    CM dropped in on pt and his spouse. Pt states he's having a 'fair day". CM continues to follow and update for possible CM needs.

## 2019-05-21 NOTE — Progress Notes (Signed)
H&P/Consult Note/Progress Note/Office Note:   Matthew Sherman  MRN: [id number]  DOB:[birth year]  Age:78 y.o.     General Surgery Consult ordered by: Delbert Phenix, NP  Reason for General Surgery Consult: Pancreatic Mass    HPI: Matthew Sherman is a 78 y.o. male with PMH of HTN, who is seen in consultation at the request of Delbert Phenix, NP for pancreatic mass noted on 05/15/19 CT. Pt was admitted 10/17 for progressive SOB. Pt reports he has been experiencing intermittent worsening abdominal pain, distention, and anorexia over the past year.   Pt also c/o N/V this am.     05/15/19 CT Chest/Abd/Pelvis  IMPRESSION:   1.  Primary pancreas tumor.  2.  Peritoneal carcinomatosis and ascites.  3.  Multiple bilateral pulmonary emboli.    05/17/2019  Pt denies abdominal pain this AM.  Appears SOB.     05/18/19 In bed resting comfortably. No complaints. Will plan surgery tomorrow.     05/20/2019 Patient sleeping- JP maintained with 566m output documented.  Abdomen distended. Steri strips maintained. Will restart heparin gtt and will consult palliative care.  He has large amount of disease burden.  Unable to place PleurX yesterday related to some infected component noted in abdomen.    05/21/2019 Pt awake in bed. Drain in place with serous output. Abdomen slightly distended. Steri strips maintained.       Past Medical History:   Diagnosis Date   ??? Hypertension      Past Surgical History:   Procedure Laterality Date   ??? HX APPENDECTOMY  1956   ??? HX CATARACT REMOVAL Bilateral     right done 2018, left 2002   ??? HX LUMBAR LAMINECTOMY  06/09/2018    lami L2-3. L3-4 4-5 Dr. MBradd Canary  ??? HX TONSIL AND ADENOIDECTOMY       Current Facility-Administered Medications   Medication Dose Route Frequency   ??? HYDROmorphone (PF) (DILAUDID) injection 1 mg  1 mg IntraVENous Q4H PRN   ??? heparin 25,000 units in dextrose 500 mL infusion  500 Units/hr IntraVENous CONTINUOUS    ??? HYDROmorphone (PF) (DILAUDID) injection 1 mg  1 mg IntraVENous Q4H PRN   ??? [Held by provider] lisinopriL (PRINIVIL, ZESTRIL) tablet 5 mg  5 mg Oral DAILY   ??? ciprofloxacin HCl (CIPRO) tablet 250 mg  250 mg Oral Q12H   ??? prucalopride (MOTEGRITY) tab 2 mg (Patient Supplied)  2 mg Oral DAILY   ??? famotidine (PF) (PEPCID) 20 mg in 0.9% sodium chloride 10 mL injection  20 mg IntraVENous Q12H   ??? oxyCODONE-acetaminophen (PERCOCET) 5-325 mg per tablet 1 Tab  1 Tab Oral Q4H PRN   ??? oxyCODONE-acetaminophen (PERCOCET 10) 10-325 mg per tablet 1 Tab  1 Tab Oral Q4H PRN   ??? sodium chloride (NS) flush 5-40 mL  5-40 mL IntraVENous Q8H   ??? sodium chloride (NS) flush 5-40 mL  5-40 mL IntraVENous PRN   ??? acetaminophen (TYLENOL) tablet 650 mg  650 mg Oral Q6H PRN    Or   ??? acetaminophen (TYLENOL) suppository 650 mg  650 mg Rectal Q6H PRN   ??? polyethylene glycol (MIRALAX) packet 17 g  17 g Oral DAILY PRN   ??? ondansetron (ZOFRAN) injection 4 mg  4 mg IntraVENous Q6H PRN     Protonix [pantoprazole]  Social History     Socioeconomic History   ??? Marital status: MARRIED     Spouse name: Not on file   ??? Number of children:  Not on file   ??? Years of education: Not on file   ??? Highest education level: Not on file   Tobacco Use   ??? Smoking status: Never Smoker   ??? Smokeless tobacco: Never Used   Substance and Sexual Activity   ??? Alcohol use: Never     Frequency: Never   ??? Drug use: Never   ??? Sexual activity: Not Currently     Partners: Female     Social History     Tobacco Use   Smoking Status Never Smoker   Smokeless Tobacco Never Used     Family History   Problem Relation Age of Onset   ??? Heart Attack Father    ??? No Known Problems Sister    ??? No Known Problems Brother    ??? No Known Problems Maternal Grandmother    ??? No Known Problems Maternal Grandfather    ??? No Known Problems Paternal Grandmother    ??? No Known Problems Paternal Grandfather      ROS: Comprehensive review of systems was otherwise unremarkable except as noted above. + SOB     Physical Exam:   Visit Vitals  BP (!) 106/53   Pulse 78   Temp 97.7 ??F (36.5 ??C)   Resp 20   Ht 6' (1.829 m)   Wt 214 lb (97.1 kg)   SpO2 98%   BMI 29.02 kg/m??     Vitals:    05/20/19 2042 05/21/19 0108 05/21/19 0417 05/21/19 0802   BP: (!) 104/58 (!) 92/49 (!) 98/48 (!) 106/53   Pulse: 87 78 73 78   Resp: '18 18 18 20   ' Temp: 97.8 ??F (36.6 ??C) 98 ??F (36.7 ??C) 98.3 ??F (36.8 ??C) 97.7 ??F (36.5 ??C)   SpO2: 97% 96% 96% 98%   Weight:       Height:         No intake/output data recorded.  10/21 1901 - 10/23 0700  In: 0   Out: 2270 [Urine:1000; Drains:1270]    Constitutional: Alert, oriented, cooperative   Eyes: Sclera are clear. EOMs intact  ENMT: no external lesions gross hearing normal; no obvious neck masses, no ear or lip lesions, nares normal  CV: RRR. Normal perfusion  Resp: No JVD.  Breathing is slightly labored; no audible wheezing.    GI:  soft, some distention, blake drain maintained  Musculoskeletal: No edema or cyanosis.   Neuro: no focal deficits  Psychiatric: normal affect and mood, no memory impairment    Recent vitals (if inpt):  Patient Vitals for the past 24 hrs:   BP Temp Pulse Resp SpO2   05/21/19 0802 (!) 106/53 97.7 ??F (36.5 ??C) 78 20 98 %   05/21/19 0417 (!) 98/48 98.3 ??F (36.8 ??C) 73 18 96 %   05/21/19 0108 (!) 92/49 98 ??F (36.7 ??C) 78 18 96 %   05/20/19 2042 (!) 104/58 97.8 ??F (36.6 ??C) 87 18 97 %   05/20/19 1828 111/62 97.8 ??F (36.6 ??C) 84 18 97 %   05/20/19 1354 (!) 102/50 96.8 ??F (36 ??C) 79 20 97 %       Labs:  Recent Labs     05/21/19  0352   WBC 11.5*   HGB 14.0   PLT 244   NA 139   K 4.1   CL 107   CO2 26   BUN 16   CREA 0.80   GLU 99   APTT 42.7*  Lab Results   Component Value Date/Time    WBC 11.5 (H) 05/21/2019 03:52 AM    HGB 14.0 05/21/2019 03:52 AM    PLATELET 244 05/21/2019 03:52 AM    Sodium 139 05/21/2019 03:52 AM    Potassium 4.1 05/21/2019 03:52 AM    Chloride 107 05/21/2019 03:52 AM    CO2 26 05/21/2019 03:52 AM    BUN 16 05/21/2019 03:52 AM     Creatinine 0.80 05/21/2019 03:52 AM    Glucose 99 05/21/2019 03:52 AM    INR 1.3 05/18/2019 03:32 AM    aPTT 42.7 (H) 05/21/2019 03:52 AM    Bilirubin, total 0.4 05/18/2019 03:32 AM    Bilirubin, direct 0.2 05/16/2019 01:04 AM    ALT (SGPT) 48 05/18/2019 03:32 AM    Alk. phosphatase 73 05/18/2019 03:32 AM    Lipase 42 (L) 05/15/2019 02:16 PM       CT Results  (Last 48 hours)    None        chest X-ray      I reviewed recent labs, recent radiologic studies, and pertinent records including other doctor notes if needed.    I independently reviewed radiology images for studies I described above or studies I have ordered.   Admission date (for inpatients): 05/15/2019   * No surgery found *  Procedure(s):  LAPAROSCOPY GENERAL DIAGNOSTIC  INFUSAPORT INSERTION    ASSESSMENT/PLAN:  Problem List  Date Reviewed: May 02, 2019          Codes Class Noted    Pulmonary emboli [facility]) ICD-10-CM: I26.99  ICD-9-CM: 415.19  05/15/2019        Pancreatic mass ICD-10-CM: K86.89  ICD-9-CM: 577.8  05/15/2019        Leukocytosis ICD-10-CM: D72.829  ICD-9-CM: 288.60  05/15/2019        Polycythemia ICD-10-CM: D75.1  ICD-9-CM: 238.4  05/15/2019        S/P laminectomy ICD-10-CM: W58.099  ICD-9-CM: V45.89  06/11/2018        Spinal stenosis of lumbar region with neurogenic claudication ICD-10-CM: M48.062  ICD-9-CM: 724.03  06/09/2018        Hypertension ICD-10-CM: I10  ICD-9-CM: 401.9  Unknown            Active Problems:    Hypertension ()      Pulmonary emboli ([place]) (05/15/2019)      Pancreatic mass (05/15/2019)      Leukocytosis (05/15/2019)      Polycythemia (05/15/2019)         Plan:  Care management per Hospitalist  --IV Heparin gtt for Bilateral PE- restarted 10/22 per Dr. Sheria Lang  PTT q AM   Maintain blake drain  Palliative care consulted  diffuse peritoneal carcinomatosis with moderate amount of ascites.  There are several small bowel loops in the pelvis which were apparently infiltrated with inflammatory changes around.  This could represent bowel  invasion with contained perforation, so PleurX catheter was not placed, instead a temporary drain which is #19 Blake drain was left in pelvis.    Docia Chuck, NP

## 2019-05-21 NOTE — Progress Notes (Signed)
Problem: Mobility Impaired (Adult and Pediatric)  Goal: *Acute Goals and Plan of Care (Insert Text)  Outcome: Progressing Towards Goal  Note: LTG:  (1.)Matthew Sherman will move from supine to sit and sit to supine , scoot up and down, and roll side to side in bed with INDEPENDENCE within 7 treatment day(s).    (2.)Matthew Sherman will transfer from bed to chair and chair to bed with MODIFIED INDEPENDENCE using the least restrictive device within 7 treatment day(s).    (3.)Matthew Sherman will ambulate with MODIFIED INDEPENDENCE for 500 feet with the least restrictive device within 7 treatment day(s).  (4.)Matthew Sherman will participate in therapeutic activity/exercises x 25 minutes for increased activity tolerance within 7 days.  (5.)Matthew Sherman will ascend and descend 2 stairs using 0 hand rail(s) with SUPERVISION to improve functional mobility and safety within 7 day(s).    ________________________________________________________________________________________________         PHYSICAL THERAPY: Daily Note and AM 05/21/2019  INPATIENT: PT Visit Days : 1  Payor: [employer] MEDICARE / Plan: [place] PPO/PFFS / Product Type: Managed Care Medicare /       NAME/AGE/GENDER: Matthew Sherman is a 78 y.o. male   PRIMARY DIAGNOSIS: Pulmonary emboli (HCC) [I26.99] <principal problem not specified> <principal problem not specified>  Procedure(s) (LRB):  LAPAROSCOPY GENERAL DIAGNOSTIC (N/A)  INFUSAPORT INSERTION (Left)  2 Days Post-Op  ICD-10: Treatment Diagnosis:    ?? Generalized Muscle Weakness (M62.81)  ?? Difficulty in walking, Not elsewhere classified (R26.2)   Precaution/Allergies:  Protonix [pantoprazole]      ASSESSMENT:     Matthew Sherman is a 78 y.o. male in the hospital for the above who was supine in bed upon arrival.  Pt has not been OOB with PT in 4 days due to several refusals. RN reported that he has been told he needs to get OOB. Initially  he was not opening eyes and when asked to get to EOB he attempted sit up style  Unsuccessfully. MinA to direct to log rolling and pushing to EOB and pt was able to due initially with CGA and finishing trnf to minA.  He stood with minA and ambulated 32f with RW. During ambulation he repeated he had some lightheadedness, but was able to complete 551f He returned to room due to fatigue and sat in chair. He is very slow moving during entire session, extra time needed.  Pt has decreased ability since last visit 4 days ago due to deconditioning of refusals the past 4 days.  He was grateful he was asst'd walking today.  used O2  3  L/min.  Mr. YoInnocentould benefit from skilled PT as he is currently functioning below his baseline.      This section established at most recent assessment   PROBLEM LIST (Impairments causing functional limitations):  1. Decreased Strength  2. Decreased Ambulation Ability/Technique  3. Decreased Balance  4. Decreased Activity Tolerance  5. Increased Fatigue  6. Decreased Cognition   INTERVENTIONS PLANNED: (Benefits and precautions of physical therapy have been discussed with the patient.)  1. Balance Exercise  2. Bed Mobility  3. Family Education  4. Gait Training  5. Home Exercise Program (HEP)  6. Neuromuscular Re-education/Strengthening  7. Therapeutic Activites  8. Therapeutic Exercise/Strengthening  9. Transfer Training     TREATMENT PLAN: Frequency/Duration: 3 times a week for duration of hospital stay  Rehabilitation Potential For Stated Goals: Good     REHAB RECOMMENDATIONS (at time of discharge pending progress):  Placement:  It is my opinion, based on this patient's performance to date, that Matthew Sherman may benefit from being discharged with NO further skilled therapy due to all goals being met.  Equipment:   ? None at this time              HISTORY:   History of Present Injury/Illness (Reason for Referral):  B pulmonary emboli  Past Medical History/Comorbidities:    Matthew Sherman  has a past medical history of Hypertension.  Matthew Sherman  has a past surgical history that includes hx appendectomy (1956); hx cataract removal (Bilateral); hx tonsil and adenoidectomy; and hx lumbar laminectomy (06/09/2018).  Social History/Living Environment:   Home Environment: Private residence  # Steps to Enter: 2  Rails to Enter: No  One/Two Story Residence: One story  Living Alone: No  Support Systems: Copy  Patient Expects to be Discharged to:: Private residence  Current DME Used/Available at Home: None  Tub or Shower Type: Tub/Shower combination  Prior Level of Function/Work/Activity:  Lives with spouse and PTA independent with ambulation.     Number of Personal Factors/Comorbidities that affect the Plan of Care: 1-2: MODERATE COMPLEXITY   EXAMINATION:   Most Recent Physical Functioning:   Gross Assessment:                  Posture:     Balance:  Sitting: Intact  Standing: Impaired  Standing - Static: Fair  Standing - Dynamic : Fair Bed Mobility:  Supine to Sit: Minimum assistance  Scooting: Stand-by assistance  Wheelchair Mobility:     Transfers:  Sit to Stand: Minimum assistance  Stand to Sit: Contact guard assistance  Gait:     Distance (ft): 50 Feet (ft)  Assistive Device: Walker, rolling  Ambulation - Level of Assistance: Minimal assistance      Body Structures Involved:  1. Lungs  2. Digestive Structures  3. Muscles Body Functions Affected:  1. Respiratory  2. Neuromusculoskeletal  3. Movement Related  4. Digestive Activities and Participation Affected:  1. Mobility  2. Community, Social and [employer]   Number of elements that affect the Plan of Care: 4+: HIGH COMPLEXITY   CLINICAL PRESENTATION:   Presentation: Stable and uncomplicated: LOW COMPLEXITY   CLINICAL DECISION MAKING:   [place]??? ???6 Clicks???   Basic Mobility Inpatient Short Form  How much difficulty does the patient currently have... Unable A Lot A Little None    1.  Turning over in bed (including adjusting bedclothes, sheets and blankets)?   '[]'  1   '[]'  2   '[]'  3   '[x]'  4   2.  Sitting down on and standing up from a chair with arms ( e.g., wheelchair, bedside commode, etc.)   '[]'  1   '[]'  2   '[]'  3   '[x]'  4   3.  Moving from lying on back to sitting on the side of the bed?   '[]'  1   '[]'  2   '[x]'  3   '[]'  4   How much help from another person does the patient currently need... Total A Lot A Little None   4.  Moving to and from a bed to a chair (including a wheelchair)?   '[]'  1   '[]'  2   '[x]'  3   '[]'  4   5.  Need to walk in hospital room?   '[]'  1   '[]'  2   '[x]'  3   '[]'  4  6.  Climbing 3-5 steps with a railing?   '[]'  1   '[]'  2   '[x]'  3   '[]'  4   ?? 2007, [employer], under license to [employer]. All rights reserved      Score:  Initial: 20 Most Recent: X (Date: -- )    Interpretation of Tool:  Represents activities that are increasingly more difficult (i.e. Bed mobility, Transfers, Gait).    Medical Necessity:     ?? Patient demonstrates   ?? good  ??  rehab potential due to higher previous functional level.  Reason for Services/Other Comments:  ?? Patient continues to require skilled intervention due to   ?? Decreased balance and activity tolerance  ?? .   Use of outcome tool(s) and clinical judgement create a POC that gives a: Clear prediction of patient's progress: LOW COMPLEXITY            TREATMENT:   (In addition to Assessment/Re-Assessment sessions the following treatments were rendered)   Pre-treatment Symptoms/Complaints:  NO complaints  Pain: Initial:   Pain Intensity 1: 0  Post Session:  0     Therapeutic Exercise: ( minutes):  Exercises including STS transfers, ambulating in hall, and sitting/standing activities to improve mobility, strength, balance, and activity tolerance .  Required minimal visual and verbal cues to promote proper body posture and promote proper body breathing techniques.  Progressed complexity of movement and ambulation distance  as indicated.     Therapeutic Activity (    12mn):  Therapeutic activities per grid below to improve mobility, strength, balance and coordination.  Required maximal verbal cues to promote static and dynamic balance in standing, promote coordination of bilateral, lower extremity(s) and promote motor control of bilateral, lower extremity(s).      Braces/Orthotics/Lines/Etc:   ?? IV  ?? O2 Device: Nasal cannula  Treatment/Session Assessment:    ?? Response to Treatment:  Tolerated well but appeared to have moments of confusion.  ?? Interdisciplinary Collaboration:   o Physical Therapist  o Registered Nurse  ?? After treatment position/precautions:   o Up in chair  o Bed/Chair-wheels locked  o Bed in low position  o Call light within reach  o RN notified   ?? Compliance with Program/Exercises: Will assess as treatment progresses  ?? Recommendations/Intent for next treatment session:  "Next visit will focus on advancements to more challenging activities and reduction in assistance provided".  Total Treatment Duration:  PT Patient Time In/Time Out  Time In: 0850  Time Out: 0[place] DPT

## 2019-05-21 NOTE — Other (Signed)
Bedside and verbal report received from Nicole Rn

## 2019-05-21 NOTE — Progress Notes (Signed)
Progress Note    Patient: Matthew Sherman MRN: [id number]  SSN: [id number]    Date of Birth: [birth year]  Age: 78 y.o.  Sex: male      Admit Date: 05/15/2019    LOS: 6 days     Subjective:   F/U pancreatic tumor    "78 yo male with PMH of HTN and Lumbar laminectomy who is evaluated with progressive shortness of breath and found with bilateral PE and pancreatitic tumor.  CT Chest/ AP done in the ED and shows primary pancreas tumor, peritoneal carcinomatosis and bilateral PE. He has been placed on IV heparin. Duplex LE no DVT." GI, surgery Dr. Sheria Lang and oncology consulted. EGD and EUS with FNA performed on 10/20. CEA 2.5. Caner antigen 19-9 2,926.2. ECHO with EF 55%, wall thickness mildly increased.  S/p diagnostic laparoscopy with peritoneal biopsy and placement of pelvic drain on 10/21. Findings showed Pancreatic cancer with extensive peritoneal carcinomatosis concerning for small bowel obstruction and localized perforation/peritonitis in pelvis. Palliative care consulted. Oncology will see as outpatient to discuss prognosis.     No chest pain. No SOB. Feels abdomen is small today.   Current Facility-Administered Medications   Medication Dose Route Frequency   ??? HYDROmorphone (PF) (DILAUDID) injection 1 mg  1 mg IntraVENous Q4H PRN   ??? heparin 25,000 units in dextrose 500 mL infusion  500 Units/hr IntraVENous CONTINUOUS   ??? HYDROmorphone (PF) (DILAUDID) injection 1 mg  1 mg IntraVENous Q4H PRN   ??? [Held by provider] lisinopriL (PRINIVIL, ZESTRIL) tablet 5 mg  5 mg Oral DAILY   ??? ciprofloxacin HCl (CIPRO) tablet 250 mg  250 mg Oral Q12H   ??? prucalopride (MOTEGRITY) tab 2 mg (Patient Supplied)  2 mg Oral DAILY   ??? famotidine (PF) (PEPCID) 20 mg in 0.9% sodium chloride 10 mL injection  20 mg IntraVENous Q12H   ??? oxyCODONE-acetaminophen (PERCOCET) 5-325 mg per tablet 1 Tab  1 Tab Oral Q4H PRN   ??? oxyCODONE-acetaminophen (PERCOCET 10) 10-325 mg per tablet 1 Tab  1 Tab Oral Q4H PRN    ??? sodium chloride (NS) flush 5-40 mL  5-40 mL IntraVENous Q8H   ??? sodium chloride (NS) flush 5-40 mL  5-40 mL IntraVENous PRN   ??? acetaminophen (TYLENOL) tablet 650 mg  650 mg Oral Q6H PRN    Or   ??? acetaminophen (TYLENOL) suppository 650 mg  650 mg Rectal Q6H PRN   ??? polyethylene glycol (MIRALAX) packet 17 g  17 g Oral DAILY PRN   ??? ondansetron (ZOFRAN) injection 4 mg  4 mg IntraVENous Q6H PRN       Objective:     Vitals:    05/20/19 2042 05/21/19 0108 05/21/19 0417 05/21/19 0802   BP: (!) 104/58 (!) 92/49 (!) 98/48 (!) 106/53   Pulse: 87 78 73 78   Resp: 18 18 18 20    Temp: 97.8 ??F (36.6 ??C) 98 ??F (36.7 ??C) 98.3 ??F (36.8 ??C) 97.7 ??F (36.5 ??C)   SpO2: 97% 96% 96% 98%   Weight:       Height:             Intake and Output:  Current Shift: 10/23 0701 - 10/23 1900  In: -   Out: 190 [Drains:190]  Last three shifts: 10/21 1901 - 10/23 0700  In: 0   Out: 2270 [Urine:1000; Drains:1270]    Physical Exam:   General:  Alert, cooperative, frail appearing.    Eyes:  Conjunctivae/corneas clear.  Ears:  Normal TMs and external ear canals both ears.   Nose: Nares normal. Septum midline.   Mouth/Throat: Lips, mucosa, and tongue normal.    Neck:  no JVD.   Back:   deferred   Lungs:   Clear to auscultation bilaterally but limited breathe sounds.   Heart:  Regular rate and rhythm, S1, S2 normal   Abdomen:   Distended, mild tenderness to palpation throughout. +BS that are slow. JP drain with serosanguineous fluid.    Extremities: Extremities normal, atraumatic, no cyanosis or edema.   Pulses: 2+ and symmetric all extremities.   Skin: Skin color, texture, turgor normal. No rashes or lesions   Lymph nodes: Cervical, supraclavicular, and axillary nodes normal.   Neurologic: CNII-XII intact.       Lab/Data Review:    Recent Results (from the past 24 hour(s))   CBC WITH AUTOMATED DIFF    Collection Time: 05/21/19  3:52 AM   Result Value Ref Range    WBC 11.5 (H) 4.3 - 11.1 K/uL    RBC 5.07 4.23 - 5.6 M/uL    HGB 14.0 13.6 - 17.2 g/dL     HCT 42.7 41.1 - 50.3 %    MCV 84.2 79.6 - 97.8 FL    MCH 27.6 26.1 - 32.9 PG    MCHC 32.8 31.4 - 35.0 g/dL    RDW 13.4 11.9 - 14.6 %    PLATELET 244 150 - 450 K/uL    MPV 9.3 (L) 9.4 - 12.3 FL    ABSOLUTE NRBC 0.00 0.0 - 0.2 K/uL    DF AUTOMATED      NEUTROPHILS 73 43 - 78 %    LYMPHOCYTES 11 (L) 13 - 44 %    MONOCYTES 13 (H) 4.0 - 12.0 %    EOSINOPHILS 1 0.5 - 7.8 %    BASOPHILS 1 0.0 - 2.0 %    IMMATURE GRANULOCYTES 2 0.0 - 5.0 %    ABS. NEUTROPHILS 8.4 (H) 1.7 - 8.2 K/UL    ABS. LYMPHOCYTES 1.3 0.5 - 4.6 K/UL    ABS. MONOCYTES 1.5 (H) 0.1 - 1.3 K/UL    ABS. EOSINOPHILS 0.1 0.0 - 0.8 K/UL    ABS. BASOPHILS 0.1 0.0 - 0.2 K/UL    ABS. IMM. GRANS. 0.2 0.0 - 0.5 K/UL   METABOLIC PANEL, BASIC    Collection Time: 05/21/19  3:52 AM   Result Value Ref Range    Sodium 139 136 - 145 mmol/L    Potassium 4.1 3.5 - 5.1 mmol/L    Chloride 107 98 - 107 mmol/L    CO2 26 21 - 32 mmol/L    Anion gap 6 (L) 7 - 16 mmol/L    Glucose 99 65 - 100 mg/dL    BUN 16 8 - 23 MG/DL    Creatinine 0.80 0.8 - 1.5 MG/DL    GFR est AA >60 >60 ml/min/1.92m2    GFR est non-AA >60 >60 ml/min/1.89m2    Calcium 8.1 (L) 8.3 - 10.4 MG/DL   PTT    Collection Time: 05/21/19  3:52 AM   Result Value Ref Range    aPTT 42.7 (H) 24.1 - 35.1 SEC       Assessment/ Plan:     Active Problems:    Hypertension ()      Pulmonary emboli ([place]) (05/15/2019)      Pancreatic mass (05/15/2019)      Leukocytosis (05/15/2019)      Polycythemia (05/15/2019)  Pancreatic mass- S/p EGD/EUS with biopsy on 10/20. Hematology will see as outpatient follow up with Dr. Ronny Flurry.  S/p diagnostic laparoscopy with peritoneal biopsy and placement of pelvic drain on 10/21. Findings showed Pancreatic cancer with extensive peritoneal carcinomatosis concerning for small bowel obstruction and localized perforation/peritonitis in pelvis. Palliative care consulted. Overall poor prognosis.     PE - heparin drip for the next few days since small perforations seen  during surgery. Will see if general surgery okay to start Eliquis tomorrow.     Constipation-  Motegrity     HTN- Hold ACE since BP borderline low.     General surgery okay for clear liquid diet.     DVT prophylaxis - heparin drip   Signed By: Scherrie Merritts, DO     May 21, 2019

## 2019-05-22 LAB — CBC W/O DIFF
ABSOLUTE NRBC: 0 10*3/uL (ref 0.0–0.2)
HCT: 44.5 % (ref 41.1–50.3)
HGB: 14.6 g/dL (ref 13.6–17.2)
MCH: 27.4 PG (ref 26.1–32.9)
MCHC: 32.8 g/dL (ref 31.4–35.0)
MCV: 83.5 FL (ref 79.6–97.8)
MPV: 9.8 FL (ref 9.4–12.3)
PLATELET: 242 10*3/uL (ref 150–450)
RBC: 5.33 M/uL (ref 4.23–5.6)
RDW: 13.6 % (ref 11.9–14.6)
WBC: 12.9 10*3/uL — ABNORMAL HIGH (ref 4.3–11.1)

## 2019-05-22 LAB — PTT: aPTT: 55.8 s — ABNORMAL HIGH (ref 24.1–35.1)

## 2019-05-22 MED ORDER — APIXABAN 5 MG TABLET
5 mg | Freq: Two times a day (BID) | ORAL | Status: DC
Start: 2019-05-22 — End: 2019-05-24

## 2019-05-22 MED ORDER — APIXABAN 5 MG TABLET
5 mg | Freq: Two times a day (BID) | ORAL | Status: DC
Start: 2019-05-22 — End: 2019-05-24
  Administered 2019-05-22 – 2019-05-24 (×4): via ORAL

## 2019-05-22 MED FILL — FAMOTIDINE (PF) 20 MG/2 ML IV: 20 mg/2 mL | INTRAVENOUS | Qty: 2

## 2019-05-22 MED FILL — OXYCODONE-ACETAMINOPHEN 5 MG-325 MG TAB: 5-325 mg | ORAL | Qty: 1

## 2019-05-22 MED FILL — HEPARIN (PORCINE) IN D5W 25,000 UNIT/500 ML IV: 25000 unit/500 mL (50 unit/mL) | INTRAVENOUS | Qty: 500

## 2019-05-22 MED FILL — ELIQUIS 5 MG TABLET: 5 mg | ORAL | Qty: 2

## 2019-05-22 MED FILL — CIPROFLOXACIN 250 MG TAB: 250 mg | ORAL | Qty: 1

## 2019-05-22 NOTE — Other (Signed)
Bedside and Verbal shift change report given to myself (oncoming nurse) by Lauren, RN (offgoing nurse). Report included the following information SBAR, Kardex, MAR and Recent Results.

## 2019-05-22 NOTE — Progress Notes (Signed)
Progress Note    Patient: Matthew Sherman MRN: [id number]  SSN: [id number]    Date of Birth: Aug 09, 1940  Age: 78 y.o.  Sex: male      Admit Date: 05/15/2019    LOS: 7 days     Subjective:   F/U pancreatic tumor    "78 yo male with PMH of HTN and Lumbar laminectomy who is evaluated with progressive shortness of breath and found with bilateral PE and pancreatitic tumor.  CT Chest/ AP done in the ED and shows primary pancreas tumor, peritoneal carcinomatosis and bilateral PE. He has been placed on IV heparin. Duplex LE no DVT." GI, surgery Dr. Sheria Lang and oncology consulted. EGD and EUS with FNA performed on 10/20. CEA 2.5. Caner antigen 19-9 2,926.2. ECHO with EF 55%, wall thickness mildly increased.  S/p diagnostic laparoscopy with peritoneal biopsy and placement of pelvic drain on 10/21. Findings showed Pancreatic cancer with extensive peritoneal carcinomatosis concerning for small bowel obstruction and localized perforation/peritonitis in pelvis. Palliative care consulted. Oncology will see as outpatient to discuss prognosis.     No chest pain. No SOB. Denies BM or passing flatus.   Current Facility-Administered Medications   Medication Dose Route Frequency   ??? apixaban (ELIQUIS) tablet 10 mg  10 mg Oral BID   ??? [START ON 05/29/2019] apixaban (ELIQUIS) tablet 5 mg  5 mg Oral BID   ??? HYDROmorphone (PF) (DILAUDID) injection 1 mg  1 mg IntraVENous Q4H PRN   ??? HYDROmorphone (PF) (DILAUDID) injection 1 mg  1 mg IntraVENous Q4H PRN   ??? [Held by provider] lisinopriL (PRINIVIL, ZESTRIL) tablet 5 mg  5 mg Oral DAILY   ??? prucalopride (MOTEGRITY) tab 2 mg (Patient Supplied)  2 mg Oral DAILY   ??? famotidine (PF) (PEPCID) 20 mg in 0.9% sodium chloride 10 mL injection  20 mg IntraVENous Q12H   ??? oxyCODONE-acetaminophen (PERCOCET) 5-325 mg per tablet 1 Tab  1 Tab Oral Q4H PRN   ??? oxyCODONE-acetaminophen (PERCOCET 10) 10-325 mg per tablet 1 Tab  1 Tab Oral Q4H PRN    ??? sodium chloride (NS) flush 5-40 mL  5-40 mL IntraVENous Q8H   ??? sodium chloride (NS) flush 5-40 mL  5-40 mL IntraVENous PRN   ??? acetaminophen (TYLENOL) tablet 650 mg  650 mg Oral Q6H PRN    Or   ??? acetaminophen (TYLENOL) suppository 650 mg  650 mg Rectal Q6H PRN   ??? polyethylene glycol (MIRALAX) packet 17 g  17 g Oral DAILY PRN   ??? ondansetron (ZOFRAN) injection 4 mg  4 mg IntraVENous Q6H PRN       Objective:     Vitals:    05/22/19 0039 05/22/19 0427 05/22/19 0856 05/22/19 1303   BP: (!) 106/59 98/60 (!) 96/54 (!) 106/54   Pulse: 75 64 71 68   Resp: 17 18 18 18    Temp: 97.6 ??F (36.4 ??C) 97.5 ??F (36.4 ??C) 97.5 ??F (36.4 ??C) 97.8 ??F (36.6 ??C)   SpO2: 98% 97% 95% 99%   Weight:  93.2 kg (205 lb 8 oz)     Height:             Intake and Output:  Current Shift: No intake/output data recorded.  Last three shifts: 10/22 1901 - 10/24 0700  In: 700 [P.O.:700]  Out: 1710 [Urine:675; Drains:1035]    Physical Exam:   General:  Alert, cooperative, frail appearing.    Eyes:  Conjunctivae/corneas clear.    Ears:  Normal TMs and  external ear canals both ears.   Nose: Nares normal. Septum midline.   Mouth/Throat: Lips, mucosa, and tongue normal.    Neck:  no JVD.   Back:   deferred   Lungs:   Clear to auscultation bilaterally but limited breathe sounds.   Heart:  Regular rate and rhythm, S1, S2 normal   Abdomen:   Distended, mild tenderness to palpation throughout. +BS that are slow.     Extremities: Extremities normal, atraumatic, no cyanosis or edema.   Pulses: 2+ and symmetric all extremities.   Skin: Skin color, texture, turgor normal. No rashes or lesions   Lymph nodes: Cervical, supraclavicular, and axillary nodes normal.   Neurologic: CNII-XII intact.       Lab/Data Review:    Recent Results (from the past 24 hour(s))   PTT    Collection Time: 05/22/19  4:11 AM   Result Value Ref Range    aPTT 55.8 (H) 24.1 - 35.1 SEC   CBC W/O DIFF    Collection Time: 05/22/19  4:11 AM   Result Value Ref Range     WBC 12.9 (H) 4.3 - 11.1 K/uL    RBC 5.33 4.23 - 5.6 M/uL    HGB 14.6 13.6 - 17.2 g/dL    HCT 44.5 41.1 - 50.3 %    MCV 83.5 79.6 - 97.8 FL    MCH 27.4 26.1 - 32.9 PG    MCHC 32.8 31.4 - 35.0 g/dL    RDW 13.6 11.9 - 14.6 %    PLATELET 242 150 - 450 K/uL    MPV 9.8 9.4 - 12.3 FL    ABSOLUTE NRBC 0.00 0.0 - 0.2 K/uL       Assessment/ Plan:     Active Problems:    Hypertension ()      Pulmonary emboli ([place]) (05/15/2019)      Pancreatic mass (05/15/2019)      Leukocytosis (05/15/2019)      Polycythemia (05/15/2019)    Pancreatic mass- S/p EGD/EUS with biopsy on 10/20. Hematology will see as outpatient follow up with Dr. Ronny Flurry.  S/p diagnostic laparoscopy with peritoneal biopsy and placement of pelvic drain on 10/21. Findings showed Pancreatic cancer with extensive peritoneal carcinomatosis concerning for small bowel obstruction and localized perforation/peritonitis in pelvis. Palliative care consulted. Overall poor prognosis.     PE - Start Eliquis today. Will need to be on for at least 3 months.      Constipation-  Motegrity     HTN- Hold ACE since BP borderline low.     General surgery okay for GI soft diet.   Consult PT.     Possible dc in 1-2 days if okay with general surgery    DVT prophylaxis - Eliquis  Signed By: Scherrie Merritts, DO     May 22, 2019

## 2019-05-22 NOTE — Progress Notes (Signed)
Problem: Falls - Risk of  Goal: *Absence of Falls  Description: Document Matthew Sherman Fall Risk and appropriate interventions in the flowsheet.  Outcome: Progressing Towards Goal  Note: Fall Risk Interventions:  Mobility Interventions: Patient to call before getting OOB    Mentation Interventions: Bed/chair exit alarm    Medication Interventions: Patient to call before getting OOB, Teach patient to arise slowly    Elimination Interventions: Patient to call for help with toileting needs    History of Falls Interventions: Room close to nurse's station

## 2019-05-22 NOTE — Other (Signed)
Bedside and Verbal shift change report given to Lauren, RN (oncoming nurse) by myself (offgoing nurse). Report included the following information SBAR, Kardex, MAR and Recent Results.

## 2019-05-22 NOTE — Progress Notes (Signed)
H&P/Consult Note/Progress Note/Office Note:   Matthew Sherman  MRN: [id number]  DOB:1941/05/27  Age:78 y.o.     General Surgery Consult ordered by: Delbert Phenix, NP  Reason for General Surgery Consult: Pancreatic Mass    HPI: Matthew Sherman is a 78 y.o. male with PMH of HTN, who is seen in consultation at the request of Delbert Phenix, NP for pancreatic mass noted on 05/15/19 CT. Pt was admitted 10/17 for progressive SOB. Pt reports he has been experiencing intermittent worsening abdominal pain, distention, and anorexia over the past year.   Pt also c/o N/V this am.     05/15/19 CT Chest/Abd/Pelvis  IMPRESSION:   1.  Primary pancreas tumor.  2.  Peritoneal carcinomatosis and ascites.  3.  Multiple bilateral pulmonary emboli.    05/17/2019  Pt denies abdominal pain this AM.  Appears SOB.     05/18/19 In bed resting comfortably. No complaints. Will plan surgery tomorrow.     05/20/2019 Patient sleeping- JP maintained with 523m output documented.  Abdomen distended. Steri strips maintained. Will restart heparin gtt and will consult palliative care.  He has large amount of disease burden.  Unable to place PleurX yesterday related to some infected component noted in abdomen.    05/21/2019 Pt awake in bed. Drain in place with serous output. Abdomen slightly distended. Steri strips maintained.     05/22/2019 Pt awake in bed eating breakfast. No vomiting this am. Drain in place with serous output. Abdomen distended. Steri strips maintained.     Past Medical History:   Diagnosis Date   ??? Hypertension      Past Surgical History:   Procedure Laterality Date   ??? HX APPENDECTOMY  1956   ??? HX CATARACT REMOVAL Bilateral     right done 2018, left 2002   ??? HX LUMBAR LAMINECTOMY  06/09/2018    lami L2-3. L3-4 4-5 Dr. MBradd Canary  ??? HX TONSIL AND ADENOIDECTOMY       Current Facility-Administered Medications   Medication Dose Route Frequency   ??? HYDROmorphone (PF) (DILAUDID) injection 1 mg  1 mg IntraVENous Q4H PRN    ??? heparin 25,000 units in dextrose 500 mL infusion  600 Units/hr IntraVENous CONTINUOUS   ??? HYDROmorphone (PF) (DILAUDID) injection 1 mg  1 mg IntraVENous Q4H PRN   ??? [Held by provider] lisinopriL (PRINIVIL, ZESTRIL) tablet 5 mg  5 mg Oral DAILY   ??? prucalopride (MOTEGRITY) tab 2 mg (Patient Supplied)  2 mg Oral DAILY   ??? famotidine (PF) (PEPCID) 20 mg in 0.9% sodium chloride 10 mL injection  20 mg IntraVENous Q12H   ??? oxyCODONE-acetaminophen (PERCOCET) 5-325 mg per tablet 1 Tab  1 Tab Oral Q4H PRN   ??? oxyCODONE-acetaminophen (PERCOCET 10) 10-325 mg per tablet 1 Tab  1 Tab Oral Q4H PRN   ??? sodium chloride (NS) flush 5-40 mL  5-40 mL IntraVENous Q8H   ??? sodium chloride (NS) flush 5-40 mL  5-40 mL IntraVENous PRN   ??? acetaminophen (TYLENOL) tablet 650 mg  650 mg Oral Q6H PRN    Or   ??? acetaminophen (TYLENOL) suppository 650 mg  650 mg Rectal Q6H PRN   ??? polyethylene glycol (MIRALAX) packet 17 g  17 g Oral DAILY PRN   ??? ondansetron (ZOFRAN) injection 4 mg  4 mg IntraVENous Q6H PRN     Protonix [pantoprazole]  Social History     Socioeconomic History   ??? Marital status: MARRIED     Spouse  name: Not on file   ??? Number of children: Not on file   ??? Years of education: Not on file   ??? Highest education level: Not on file   Tobacco Use   ??? Smoking status: Never Smoker   ??? Smokeless tobacco: Never Used   Substance and Sexual Activity   ??? Alcohol use: Never     Frequency: Never   ??? Drug use: Never   ??? Sexual activity: Not Currently     Partners: Female     Social History     Tobacco Use   Smoking Status Never Smoker   Smokeless Tobacco Never Used     Family History   Problem Relation Age of Onset   ??? Heart Attack Father    ??? No Known Problems Sister    ??? No Known Problems Brother    ??? No Known Problems Maternal Grandmother    ??? No Known Problems Maternal Grandfather    ??? No Known Problems Paternal Grandmother    ??? No Known Problems Paternal Grandfather       ROS: Comprehensive review of systems was otherwise unremarkable except as noted above. + SOB    Physical Exam:   Visit Vitals  BP (!) 96/54 (BP 1 Location: Left arm, BP Patient Position: At rest)   Pulse 71   Temp 97.5 ??F (36.4 ??C)   Resp 18   Ht 6' (1.829 m)   Wt 205 lb 8 oz (93.2 kg)   SpO2 95%   BMI 27.87 kg/m??     Vitals:    05/21/19 2035 05/22/19 0039 05/22/19 0427 05/22/19 0856   BP: (!) 100/50 (!) 106/59 98/60 (!) 96/54   Pulse: 72 75 64 71   Resp: '18 17 18 18   ' Temp: 97.8 ??F (36.6 ??C) 97.6 ??F (36.4 ??C) 97.5 ??F (36.4 ??C) 97.5 ??F (36.4 ??C)   SpO2: 100% 98% 97% 95%   Weight:   205 lb 8 oz (93.2 kg)    Height:         No intake/output data recorded.  10/22 1901 - 10/24 0700  In: 700 [P.O.:700]  Out: 1710 [Urine:675; Drains:1035]    Constitutional: Alert, oriented, cooperative   Eyes: Sclera are clear. EOMs intact  ENMT: no external lesions gross hearing normal; no obvious neck masses, no ear or lip lesions, nares normal  CV: RRR. Normal perfusion  Resp: No JVD.  Breathing is slightly labored; no audible wheezing.    GI:  soft, +distention, blake drain maintained  Musculoskeletal: No edema or cyanosis.   Neuro: no focal deficits  Psychiatric: normal affect and mood, no memory impairment    Recent vitals (if inpt):  Patient Vitals for the past 24 hrs:   BP Temp Pulse Resp SpO2 Weight   05/22/19 0856 (!) 96/54 97.5 ??F (36.4 ??C) 71 18 95 % ???   05/22/19 0427 98/60 97.5 ??F (36.4 ??C) 64 18 97 % 205 lb 8 oz (93.2 kg)   05/22/19 0039 (!) 106/59 97.6 ??F (36.4 ??C) 75 17 98 % ???   05/21/19 2035 (!) 100/50 97.8 ??F (36.6 ??C) 72 18 100 % ???   05/21/19 1810 (!) 112/50 (!) 96 ??F (35.6 ??C) 76 18 97 % ???   05/21/19 1500 (!) 108/53 96.8 ??F (36 ??C) 71 20 94 % ???       Labs:  Recent Labs     05/22/19  0411 05/21/19  0352   WBC 12.9* 11.5*   HGB 14.6  14.0   PLT 242 244   NA  --  139   K  --  4.1   CL  --  107   CO2  --  26   BUN  --  16   CREA  --  0.80   GLU  --  99   APTT 55.8* 42.7*       Lab Results   Component Value Date/Time     WBC 12.9 (H) 05/22/2019 04:11 AM    HGB 14.6 05/22/2019 04:11 AM    PLATELET 242 05/22/2019 04:11 AM    Sodium 139 05/21/2019 03:52 AM    Potassium 4.1 05/21/2019 03:52 AM    Chloride 107 05/21/2019 03:52 AM    CO2 26 05/21/2019 03:52 AM    BUN 16 05/21/2019 03:52 AM    Creatinine 0.80 05/21/2019 03:52 AM    Glucose 99 05/21/2019 03:52 AM    INR 1.3 05/18/2019 03:32 AM    aPTT 55.8 (H) 05/22/2019 04:11 AM    Bilirubin, total 0.4 05/18/2019 03:32 AM    Bilirubin, direct 0.2 05/16/2019 01:04 AM    ALT (SGPT) 48 05/18/2019 03:32 AM    Alk. phosphatase 73 05/18/2019 03:32 AM    Lipase 42 (L) 05/15/2019 02:16 PM       CT Results  (Last 48 hours)    None        chest X-ray      I reviewed recent labs, recent radiologic studies, and pertinent records including other doctor notes if needed.    I independently reviewed radiology images for studies I described above or studies I have ordered.   Admission date (for inpatients): 05/15/2019   * No surgery found *  Procedure(s):  LAPAROSCOPY GENERAL DIAGNOSTIC  INFUSAPORT INSERTION    ASSESSMENT/PLAN:  Problem List  Date Reviewed: 03-May-2019          Codes Class Noted    Pulmonary emboli ([place]) ICD-10-CM: I26.99  ICD-9-CM: 415.19  05/15/2019        Pancreatic mass ICD-10-CM: K86.89  ICD-9-CM: 577.8  05/15/2019        Leukocytosis ICD-10-CM: D72.829  ICD-9-CM: 288.60  05/15/2019        Polycythemia ICD-10-CM: D75.1  ICD-9-CM: 238.4  05/15/2019        S/P laminectomy ICD-10-CM: J09.326  ICD-9-CM: V45.89  06/11/2018        Spinal stenosis of lumbar region with neurogenic claudication ICD-10-CM: M48.062  ICD-9-CM: 724.03  06/09/2018        Hypertension ICD-10-CM: I10  ICD-9-CM: 401.9  Unknown            Active Problems:    Hypertension ()      Pulmonary emboli ([place]) (05/15/2019)      Pancreatic mass (05/15/2019)      Leukocytosis (05/15/2019)      Polycythemia (05/15/2019)         Plan:  Care management per Hospitalist  --IV Heparin gtt for Bilateral PE- restarted 10/22 per Dr. Sheria Lang   PTT q AM   Maintain blake drain  Full Liquid diet  Palliative care consulted  diffuse peritoneal carcinomatosis with moderate amount of ascites.  There are several small bowel loops in the pelvis which were apparently infiltrated with inflammatory changes around.  This could represent bowel invasion with contained perforation, so PleurX catheter was not placed, instead a temporary drain which is #19 Blake drain was left in pelvis.    Docia Chuck, NP

## 2019-05-22 NOTE — Progress Notes (Addendum)
Comprehensive Nutrition Assessment    Type and Reason for Visit: Initial, RD nutrition re-screen/LOS    Nutrition Recommendations/Plan: D/C ensure enlive. Order whole milk with all meals. Encouraged pt's wife to bring in snacks/home-made smoothies for pt to have in-between meals.    Nutrition Assessment:  Pt pancreatic mass. S/P EGD/EUS (10/20) and diagnostic lap with peritoneal biopsy and port placement (10/21). PMH notable for HTN, lumbar laminectomy.     Pt seen in company of wife at bedside. Diet advanced to GI soft for dinner this evening. Ensure enlive observed unopened at bedside. Pt reports intolerance to ensure, stating that it causes emesis (reports this happened PTA as well). Pt reports a UBW of 205-210 pounds. Pt's wife states that pt did have increased early satiety leading up to admission, although she supplemented poor meal intake with home-made shakes. Pt reports eating bites of grits this AM and sips of soup for lunch. He reports no appetite.     WT / BMI 05/22/2019 04/28/2019 01/27/2019 08/31/2018 08/17/2018   WEIGHT 205 lb 8 oz 210 lb 6.4 oz 219 lb 208 lb 208 lb     WT / BMI 07/15/2018 06/29/2018 06/24/2018 06/22/2018 06/19/2018   WEIGHT 208 lb 208 lb 6 oz 211 lb 211 lb 210 lb 6.4 oz     WT / BMI 06/09/2018 06/01/2018 05/04/2018   WEIGHT 212 lb 6.4 oz 211 lb 211 lb   Per weights listed in EMR, potential for a 7 pound weight loss over ~13 months and a recent 5 pound weight loss over 1 month. Unsure of weight source from 01/27/19.    Estimated Daily Nutrient Needs:  Energy (kcal):  [id number] kcal/d (20-25 kcal/kg listed BW 93.2 kg)  Protein (g):  93-117 gm/d (20% kcal)       Fluid (ml/day):       Nutrition Related Findings:  No edema per nursing review. Abdomen intact. Date of last BM: unknown.       Current Nutrition Therapies:  DIET GI SOFT No options chosen  DIET NUTRITIONAL SUPPLEMENTS All Meals; Other ( )    Anthropometric Measures:  ?? Height:  6' (182.9 cm)   ?? Current Body Wt:  93.2 kg (205 lb 7.5 oz)(10/24 standing scale)   ?? Usual Body Wt:  210 lb     ?? Ideal Body Wt:  178 lbs:  115.4 %   Body mass index is 27.87 kg/m??.  ?? BMI Category:  Overweight (BMI 25.0-29.9)       Nutrition Diagnosis:   ?? Inadequate oral intake related to (decreased appetite, early satiety, diet restriction) as evidenced by (pt report of inability to eat much at one time, liquid diet.)    Nutrition Interventions:   Food and/or Nutrient Delivery: Continue current diet, Modify oral nutrition supplement    Goals:  Meet >75% of estimated needs within 7 days.       Nutrition Monitoring and Evaluation:   Food/Nutrient Intake Outcomes: Food and nutrient intake    Discharge Planning:    Continue oral nutrition supplement     Electronically signed by Kennieth Rad, [place], [place], [place], [place]  on 05/22/2019 at 2:27 PM

## 2019-05-22 NOTE — Progress Notes (Signed)
Bedside and verbal report given to Lena, RN

## 2019-05-22 NOTE — Progress Notes (Signed)
Bedside and  verbal report received from [place], [place]    Heparin gtt signed off, patient asleep in bed

## 2019-05-23 LAB — PTT: aPTT: 48.5 s — ABNORMAL HIGH (ref 24.1–35.1)

## 2019-05-23 MED ORDER — MAGIC MOUTHWASH (KING)
ORAL | Status: DC
Start: 2019-05-23 — End: 2019-05-24
  Administered 2019-05-23 – 2019-05-24 (×2): via ORAL

## 2019-05-23 MED ORDER — HYDROXYZINE PAMOATE 25 MG CAP
25 mg | Freq: Three times a day (TID) | ORAL | Status: DC | PRN
Start: 2019-05-23 — End: 2019-05-24

## 2019-05-23 MED ORDER — POLYETHYLENE GLYCOL 3350 17 GRAM (100 %) ORAL POWDER PACKET
17 gram | Freq: Every day | ORAL | Status: DC
Start: 2019-05-23 — End: 2019-05-24
  Administered 2019-05-23 – 2019-05-24 (×2): via ORAL

## 2019-05-23 MED FILL — HYDROXYZINE PAMOATE 25 MG CAP: 25 mg | ORAL | Qty: 1

## 2019-05-23 MED FILL — FAMOTIDINE (PF) 20 MG/2 ML IV: 20 mg/2 mL | INTRAVENOUS | Qty: 2

## 2019-05-23 MED FILL — POLYETHYLENE GLYCOL 3350 17 GRAM (100 %) ORAL POWDER PACKET: 17 gram | ORAL | Qty: 1

## 2019-05-23 MED FILL — ELIQUIS 5 MG TABLET: 5 mg | ORAL | Qty: 2

## 2019-05-23 MED FILL — GERI-LANTA 200 MG-200 MG-20 MG/5 ML ORAL SUSPENSION: 200-200-20 mg/5 mL | ORAL | Qty: 7.5

## 2019-05-23 NOTE — Other (Signed)
Bedside and Verbal shift change report given to myself (oncoming nurse) by Lauren, RN (offgoing nurse). Report included the following information SBAR, Kardex, MAR and Recent Results.

## 2019-05-23 NOTE — Progress Notes (Addendum)
Problem: Patient Education: Go to Patient Education Activity  Goal: Patient/Family Education  Outcome: Progressing Towards Goal  Note:   1. Patient will complete total body bathing and dressing with independence.   2. Patient will complete toileting with independence.   3. Patient will tolerate 30 minutes of OT treatment with self-incorporated rest breaks to increase activity tolerance for ADLs.   4. Patient will complete functional transfers with independence.   5. Patient will complete functional mobility for ADLs with independence.   6. Patient will demonstrate modified independence with therapeutic exercise HEP to increase strength in BUEs for increased safety and independence with functional transfers.     Timeframe: 7 visits        OCCUPATIONAL THERAPY: Daily Note and AM 05/23/2019  INPATIENT: OT Visit Days: 2  Payor: HU[employer] MEDICARE / Plan: [employer] MEDICARE CHOICE PPO/PFFS / Product Type: Managed Care Medicare /      NAME/AGE/GENDER: Matthew Sherman is a 78 y.o. male   PRIMARY DIAGNOSIS:  Pulmonary emboli ([facility]) [I26.99] <principal problem not specified> <principal problem not specified>  Procedure(s) (LRB):  LAPAROSCOPY GENERAL DIAGNOSTIC (N/A)  INFUSAPORT INSERTION (Left)  4 Days Post-Op  ICD-10: Treatment Diagnosis:    ?? Generalized Muscle Weakness (M62.81)   Precautions/Allergies:     Protonix [pantoprazole]      ASSESSMENT:     Matthew Sherman is a 78 y.o. male admitted with SOB and abdominal pain, found to have PE and pancreatic tumor. At baseline pt lives with wife and reports independence with ADLs, IADLs, ambulation. No hx falls, no supplemental O2 use.     05/23/2019 Pt was supine in bed upon arrival talking with his wife. Pt completed bed mobility with SBA. Pt sat EOB to completed grooming with set up. Pt requested to lay back down after sitting up for 7 minutes. Pt had good sitting balance. Pt had not slept good the night before. Minimal progress made. Continue POC.      This section established at most recent assessment   PROBLEM LIST (Impairments causing functional limitations):  1. Decreased Strength  2. Decreased ADL/Functional Activities  3. Decreased Transfer Abilities  4. Decreased Ambulation Ability/Technique  5. Decreased Balance  6. Increased Pain  7. Decreased Activity Tolerance  8. Decreased Pacing Skills  9. Decreased Work Herbalist  10. Increased Fatigue  11. Increased Shortness of Breath  12. Decreased Independence with Home Exercise Program  13. Decreased Cognition   INTERVENTIONS PLANNED: (Benefits and precautions of occupational therapy have been discussed with the patient.)  1. Activities of daily living training  2. Adaptive equipment training  3. Balance training  4. Clothing management  5. Cognitive training  6. Community reintergration  7. Donning&doffing training  8. Hygiene training  9. Neuromuscular re-eduation  10. Re-evaluation  11. Therapeutic activity  12. Therapeutic exercise     TREATMENT PLAN: Frequency/Duration: Follow patient 3x/week to address above goals.  Rehabilitation Potential For Stated Goals: Good     REHAB RECOMMENDATIONS (at time of discharge pending progress):    Placement:  It is my opinion, based on this patient's performance to date, that Matthew Sherman may benefit from being discharged with NO further skilled therapy due to the high likelihood of returning to baseline. Vs HHOT pending progress   Equipment:   ? TBD              OCCUPATIONAL PROFILE AND HISTORY:   History of Present Injury/Illness (Reason for Referral):  "CC:  Shortness of  breath   Matthew Sherman is a 78 yo male with PMH of HTN and Lumbar laminectomy who is evaluated with progressive shortness of breath. He was seen by GI last week for abdominal distention, anorexia and scheduled for EGD/ colonoscopy. He reports some loose, jelly like BM for several months. No rectal bleeding. No weight loss. No chest pain. He has anorexia.    CT Chest/ AP done in the ED and shows primary pancreas tumor, peritoneal carcinomatosis and bilateral PE on preliminary report.   His daughter [name] is present and I also discussed case with daughter 47 via phone.     10 systems reviewed and negative except as noted in HPI.- denies ear or throat pains, no arthritis, no confusion or memory loss, no edema"  Past Medical History/Comorbidities:   Matthew Sherman  has a past medical history of Hypertension.  Matthew Sherman  has a past surgical history that includes hx appendectomy (1956); hx cataract removal (Bilateral); hx tonsil and adenoidectomy; and hx lumbar laminectomy (06/09/2018).  Social History/Living Environment:   Home Environment: Private residence  # Steps to Enter: 2  Rails to Enter: No  One/Two Story Residence: One story  Living Alone: No  Support Systems: Copy  Patient Expects to be Discharged to:: Private residence  Current DME Used/Available at Home: None  Tub or Shower Type: Tub/Shower combination  Prior Level of Function/Work/Activity:  At baseline pt lives with wife and reports independence with ADLs, IADLs, ambulation. No hx falls, no supplemental O2 use.      Number of Personal Factors/Comorbidities that affect the Plan of Care: Brief history (0):  LOW COMPLEXITY   ASSESSMENT OF OCCUPATIONAL PERFORMANCE::   Activities of Daily Living:   Basic ADLs (From Assessment) Complex ADLs (From Assessment)   Feeding: Independent  Oral Facial Hygiene/Grooming: Stand-by assistance  Bathing: Stand-by assistance  Upper Body Dressing: Setup  Lower Body Dressing: Stand-by assistance  Toileting: Stand by assistance     Grooming/Bathing/Dressing Activities of Daily Living   Grooming  Washing Face: Set-up  Brushing/Combing Hair: Set-up Cognitive Retraining  Safety/Judgement: Fall prevention                       Bed/Mat Mobility  Supine to Sit: Minimum assistance  Sit to Stand: Contact guard assistance  Stand to Sit: Stand-by assistance   Bed to Chair: Stand-by assistance  Scooting: Stand-by assistance     Most Recent Physical Functioning:   Gross Assessment:                  Posture:     Balance:  Sitting: Intact Bed Mobility:  Supine to Sit: Stand-by assistance  Sit to Supine: Stand-by assistance  Wheelchair Mobility:     Transfers:               Patient Vitals for the past 6 hrs:   BP BP Patient Position SpO2 O2 Flow Rate (L/min) Pulse   05/23/19 0753 126/65 At rest 97 % ??? 82   05/23/19 0840 ??? ??? ??? 3 l/min ???   05/23/19 1223 113/62 At rest 98 % ??? 75       Mental Status  Neurologic State: Alert  Orientation Level: Oriented to person  Cognition: Follows commands  Perception: Appears intact  Perseveration: No perseveration noted  Safety/Judgement: Fall prevention                          Physical Skills Involved:  1. Balance  2. Strength  3. Activity Tolerance  4. Pain (acute) Cognitive Skills Affected (resulting in the inability to perform in a timely and safe manner):  1. Executive Function Psychosocial Skills Affected:  1. Habits/Routines  2. Environmental Adaptation  3. Social Interaction  4. Emotional Regulation  5. Self-Awareness  6. Awareness of Others  7. Social Roles   Number of elements that affect the Plan of Care: 5+:  HIGH COMPLEXITY   CLINICAL DECISION MAKING:   KB Home	[place] AM-PAC??? ???6 Clicks???   Daily Activity Inpatient Short Form  How much help from another person does the patient currently need... Total A Lot A Little None   1.  Putting on and taking off regular lower body clothing?   []  1   []  2   [x]  3   []  4   2.  Bathing (including washing, rinsing, drying)?   []  1   []  2   [x]  3   []  4   3.  Toileting, which includes using toilet, bedpan or urinal?   []  1   []  2   [x]  3   []  4   4.  Putting on and taking off regular upper body clothing?   []  1   []  2   [x]  3   []  4   5.  Taking care of personal grooming such as brushing teeth?   []  1   []  2   [x]  3   []  4   6.  Eating meals?   []  1   []  2   []  3   [x]  4    ?? 2007, [employer], under license to [employer]. All rights reserved      Score:  Initial: 19 05/17/2019 Most Recent: X (Date: -- )    Interpretation of Tool:  Represents activities that are increasingly more difficult (i.e. Bed mobility, Transfers, Gait).    Medical Necessity:     ?? Patient demonstrates   ?? good  ??  rehab potential due to higher previous functional level.  Reason for Services/Other Comments:  ?? Patient continues to require skilled intervention due to   ?? Inability to complete ADLs at prior level of independence  ?? .   Use of outcome tool(s) and clinical judgement create a POC that gives a: LOW COMPLEXITY         TREATMENT:   (In addition to Assessment/Re-Assessment sessions the following treatments were rendered)     Pre-treatment Symptoms/Complaints:    Pain: Initial:   Pain Intensity 1: 0  Post Session:  same     Self Care: (15): Procedure(s) (per grid) utilized to improve and/or restore self-care/home management as related to grooming. Required set up to facilitate activities of daily living skills.    Braces/Orthotics/Lines/Etc:   ?? IV  ?? O2 Device: Nasal cannula  Treatment/Session Assessment:    ?? Response to Treatment:  Tolerated well   ?? Interdisciplinary Collaboration:   o Certified Occupational Therapy Assistant  o Registered Nurse  ?? After treatment position/precautions:   o Supine in bed  o Bed/Chair-wheels locked  o Bed in low position  o Call light within reach  o RN notified  o Family at bedside   ?? Compliance with Program/Exercises: Compliant all of the time, Will assess as treatment progresses.  ?? Recommendations/Intent for next treatment session:  "Next visit will focus on advancements to more challenging activities and reduction in assistance provided".  Total Treatment Duration:  OT  Patient Time In/Time Out  Time In: 1020  Time Out: 1035     Joella Prince, [place]

## 2019-05-23 NOTE — Progress Notes (Signed)
Problem: Falls - Risk of  Goal: *Absence of Falls  Description: Document Matthew Sherman Fall Risk and appropriate interventions in the flowsheet.  Outcome: Progressing Towards Goal  Note: Fall Risk Interventions:  Mobility Interventions: Patient to call before getting OOB    Mentation Interventions: Bed/chair exit alarm    Medication Interventions: Patient to call before getting OOB, Teach patient to arise slowly    Elimination Interventions: Call light in reach    History of Falls Interventions: Bed/chair exit alarm         Problem: Patient Education: Go to Patient Education Activity  Goal: Patient/Family Education  Outcome: Progressing Towards Goal

## 2019-05-23 NOTE — Progress Notes (Signed)
Bedside and verbal report given to Lena, RN

## 2019-05-23 NOTE — Progress Notes (Addendum)
Progress Note    Patient: Matthew Sherman MRN: [id number]  SSN: [id number]    Date of Birth: Apr 07, 1941  Age: 78 y.o.  Sex: male      Admit Date: 05/15/2019    LOS: 8 days     Subjective:   F/U pancreatic tumor    "78 yo male with PMH of HTN and Lumbar laminectomy who is evaluated with progressive shortness of breath and found with bilateral PE and pancreatitic tumor.  CT Chest/ AP done in the ED and shows primary pancreas tumor, peritoneal carcinomatosis and bilateral PE. He has been placed on IV heparin. Duplex LE no DVT." GI, surgery Dr. Sheria Lang and oncology consulted. EGD and EUS with FNA performed on 10/20. CEA 2.5. Caner antigen 19-9 2,926.2. ECHO with EF 55%, wall thickness mildly increased.  S/p diagnostic laparoscopy with peritoneal biopsy and placement of pelvic drain on 10/21. Findings showed Pancreatic cancer with extensive peritoneal carcinomatosis concerning for small bowel obstruction and localized perforation/peritonitis in pelvis. Palliative care consulted. Oncology will see as outpatient to discuss prognosis.     Eliquis started for PE on 10/24.     No chest pain. No SOB. Denies BM or passing flatus.   Current Facility-Administered Medications   Medication Dose Route Frequency   ??? apixaban (ELIQUIS) tablet 10 mg  10 mg Oral Q12H   ??? [START ON 05/29/2019] apixaban (ELIQUIS) tablet 5 mg  5 mg Oral Q12H   ??? HYDROmorphone (PF) (DILAUDID) injection 1 mg  1 mg IntraVENous Q4H PRN   ??? HYDROmorphone (PF) (DILAUDID) injection 1 mg  1 mg IntraVENous Q4H PRN   ??? [Held by provider] lisinopriL (PRINIVIL, ZESTRIL) tablet 5 mg  5 mg Oral DAILY   ??? prucalopride (MOTEGRITY) tab 2 mg (Patient Supplied)  2 mg Oral DAILY   ??? famotidine (PF) (PEPCID) 20 mg in 0.9% sodium chloride 10 mL injection  20 mg IntraVENous Q12H   ??? oxyCODONE-acetaminophen (PERCOCET) 5-325 mg per tablet 1 Tab  1 Tab Oral Q4H PRN   ??? oxyCODONE-acetaminophen (PERCOCET 10) 10-325 mg per tablet 1 Tab  1 Tab Oral Q4H PRN    ??? sodium chloride (NS) flush 5-40 mL  5-40 mL IntraVENous Q8H   ??? sodium chloride (NS) flush 5-40 mL  5-40 mL IntraVENous PRN   ??? acetaminophen (TYLENOL) tablet 650 mg  650 mg Oral Q6H PRN    Or   ??? acetaminophen (TYLENOL) suppository 650 mg  650 mg Rectal Q6H PRN   ??? polyethylene glycol (MIRALAX) packet 17 g  17 g Oral DAILY PRN   ??? ondansetron (ZOFRAN) injection 4 mg  4 mg IntraVENous Q6H PRN       Objective:     Vitals:    05/22/19 2029 05/23/19 0036 05/23/19 0435 05/23/19 0753   BP: 110/65 120/68 112/60 126/65   Pulse: 75 79 80 82   Resp: 17 20 18 18    Temp: 97.5 ??F (36.4 ??C) 97.7 ??F (36.5 ??C) 97.9 ??F (36.6 ??C) 97.6 ??F (36.4 ??C)   SpO2: 98% 97% 99% 97%   Weight:       Height:             Intake and Output:  Current Shift: 10/25 0701 - 10/25 1900  In: -   Out: 200 [Urine:100; Drains:100]  Last three shifts: 10/23 1901 - 10/25 0700  In: 220 [P.O.:220]  Out: 1345 [Urine:250; Drains:1095]    Physical Exam:   General:  Alert, cooperative, frail appearing.  Eyes:  Conjunctivae/corneas clear.    Ears:  Normal TMs and external ear canals both ears.   Nose: Nares normal. Septum midline.   Mouth/Throat: Lips, mucosa, and tongue normal.    Neck:  no JVD.   Back:   deferred   Lungs:   Clear to auscultation bilaterally but limited breathe sounds.   Heart:  Regular rate and rhythm, S1, S2 normal   Abdomen:   Distended from ascites, non tender to palpation. +BS that are slow.     Extremities: Extremities normal, atraumatic, no cyanosis or edema.   Pulses: 2+ and symmetric all extremities.   Skin: Skin color, texture, turgor normal. No rashes or lesions   Lymph nodes: Cervical, supraclavicular, and axillary nodes normal.   Neurologic: CNII-XII intact.       Lab/Data Review:    Recent Results (from the past 24 hour(s))   PTT    Collection Time: 05/23/19  3:53 AM   Result Value Ref Range    aPTT 48.5 (H) 24.1 - 35.1 SEC       Assessment/ Plan:     Active Problems:    Hypertension ()      Pulmonary emboli ([place]) (05/15/2019)       Pancreatic mass (05/15/2019)      Leukocytosis (05/15/2019)      Polycythemia (05/15/2019)    Pancreatic mass- S/p EGD/EUS with biopsy on 10/20. Hematology will see as outpatient follow up with Dr. Ronny Flurry.  S/p diagnostic laparoscopy with peritoneal biopsy and placement of pelvic drain on 10/21. Findings showed Pancreatic cancer with extensive peritoneal carcinomatosis concerning for small bowel obstruction and localized perforation/peritonitis in pelvis. Palliative care consulted. Overall poor prognosis.     PE - Started Eliquis. Will need to be on for at least 3 months. Order 6 minute walk    Constipation-  Motegrity. Will add miralax daily     HTN- Hold ACE since BP borderline low.     Consult PT.     Possible dc in 1-2 days if has BM    DVT prophylaxis - Eliquis  Signed By: Scherrie Merritts, DO     May 23, 2019

## 2019-05-23 NOTE — Progress Notes (Addendum)
Pt states he is unable to ambulate for 6 minute oxymetry test at this time.  He feels very weak and tired.

## 2019-05-23 NOTE — Other (Signed)
Bedside and Verbal shift change report given to Lauren, RN (oncoming nurse) by myself (offgoing nurse). Report included the following information SBAR, Kardex, MAR and Recent Results.

## 2019-05-24 ENCOUNTER — Encounter

## 2019-05-24 LAB — PTT: aPTT: 42.9 s — ABNORMAL HIGH (ref 24.1–35.1)

## 2019-05-24 MED ORDER — OXYCODONE 5 MG CAP
5 mg | ORAL_CAPSULE | Freq: Four times a day (QID) | ORAL | 0 refills | Status: AC | PRN
Start: 2019-05-24 — End: 2019-05-27

## 2019-05-24 MED ORDER — APIXABAN 5 MG TABLET
5 mg | ORAL_TABLET | Freq: Two times a day (BID) | ORAL | 0 refills | Status: AC
Start: 2019-05-24 — End: 2019-05-29

## 2019-05-24 MED ORDER — MAGIC MOUTHWASH (KING)
ORAL | 0 refills | Status: AC
Start: 2019-05-24 — End: ?

## 2019-05-24 MED ORDER — FUROSEMIDE 40 MG TAB
40 mg | ORAL_TABLET | Freq: Every day | ORAL | 0 refills | Status: AC | PRN
Start: 2019-05-24 — End: ?

## 2019-05-24 MED ORDER — APIXABAN 5 MG TABLET
5 mg | ORAL_TABLET | Freq: Two times a day (BID) | ORAL | 0 refills | Status: AC
Start: 2019-05-24 — End: ?

## 2019-05-24 MED ORDER — POLYETHYLENE GLYCOL 3350 17 GRAM (100 %) ORAL POWDER PACKET
17 gram | PACK | Freq: Every day | ORAL | 0 refills | Status: AC
Start: 2019-05-24 — End: ?

## 2019-05-24 MED FILL — ELIQUIS 5 MG TABLET: 5 mg | ORAL | Qty: 2

## 2019-05-24 MED FILL — FAMOTIDINE (PF) 20 MG/2 ML IV: 20 mg/2 mL | INTRAVENOUS | Qty: 2

## 2019-05-24 MED FILL — POLYETHYLENE GLYCOL 3350 17 GRAM (100 %) ORAL POWDER PACKET: 17 gram | ORAL | Qty: 1

## 2019-05-24 NOTE — Discharge Summary (Addendum)
Hospitalist Discharge Summary     Patient ID:  Matthew Sherman  [id number]  78 y.o.  03-Apr-1941  Admit date: 05/15/2019  2:14 PM  Discharge date and time: 05/24/2019  Attending: Scherrie Merritts, DO  PCP:  Olga Millers, DO  Treatment Team: Attending Provider: Scherrie Merritts, DO; Surgeon: Thayer Jew, MD; Consulting Provider: Thayer Jew, MD; Care Manager: Herbert Seta, RN; Utilization Review: Loyce Dys, RN; Staff Nurse: Shari Prows; Primary Nurse: Merrily Pew    Principal Diagnosis Pancreatic mass   Principal Problem:    Pancreatic mass (05/15/2019)    Active Problems:    Hypertension ()      Pulmonary emboli ([place]) (05/15/2019)      Leukocytosis (05/15/2019)      Polycythemia (05/15/2019)      Ascites (05/24/2019)     Hospital Course: "78 yo male with PMH of HTN and Lumbar laminectomy who is evaluated with progressive shortness of breath and found with bilateral PE and pancreatitic tumor.  CT Chest/ AP done in the ED and shows primary pancreas tumor, peritoneal carcinomatosis and bilateral PE. He has been placed on IV heparin. Duplex LE no DVT." GI, surgery Dr. Marisue [place] oncology consulted. EGD and EUS with FNA performed on 10/20. CEA 2.5. Caner antigen 19-9 2,926.2. ECHO with EF 55%, wall thickness mildly increased.  S/p diagnostic laparoscopy with peritoneal biopsy and placement of pelvic drain on 10/21. Findings showed Pancreatic cancer with extensive peritoneal carcinomatosis concerning for small bowel obstruction and localized perforation/peritonitis in pelvis. Palliative care consulted. Oncology will see as outpatient to discuss prognosis. Hospice recommended but patient states he is not ready for this. Continues to decline with strength but declining rehab. States he just wants to go home. I have discussed poor prognosis with him and his wife and stated they could always consider hospice after discharge. Palliative care will see as  outpatient. Still with JP drain and changing every 2-3 hours from his ascites. Would benefit from home health.   ??  Eliquis started for PE on 10/24. Continue Eliquis 10mg  BID until 10/31 and then start 5mg  BID 9pm that day. 3L NC continuously. Tried 6 minute walk but too weak to do.     Needs hospital bed since has a medical condition that requires positioning the body in way not feasible with an ordinary bed.     PRN lasix if volume overload or SOB noted.     If worsening abdominal pain, fevers, AMS, please come back to the ED.     Please refer to the admission H&P for details of presentation. In summary, the patient is stable for discharge.     Significant Diagnostic Studies:       Labs: Results:       Chemistry No results for input(s): GLU, NA, K, CL, CO2, BUN, CREA, CA, AGAP, BUCR, TBIL, AP, TP, ALB, GLOB, AGRAT in the last 72 hours.    No lab exists for component: GPT   CBC w/Diff Recent Labs     05/22/19  0411   WBC 12.9*   RBC 5.33   HGB 14.6   HCT 44.5   PLT 242      Cardiac Enzymes No results for input(s): CPK, CKND1, MYO in the last 72 hours.    No lab exists for component: CKRMB, TROIP   Coagulation Recent Labs     05/24/19  0404 05/23/19  0353   APTT 42.9* 48.5*       Lipid Panel Lab Results  Component Value Date/Time    Cholesterol, total 111 02/03/2018 10:19 AM    HDL Cholesterol 36 (L) 02/03/2018 10:19 AM    LDL, calculated 63 02/03/2018 10:19 AM    VLDL, calculated 12 02/03/2018 10:19 AM    Triglyceride 59 02/03/2018 10:19 AM      BNP No results for input(s): BNPP in the last 72 hours.   Liver Enzymes No results for input(s): TP, ALB, TBIL, AP in the last 72 hours.    No lab exists for component: SGOT, GPT, DBIL   Thyroid Studies Lab Results   Component Value Date/Time    TSH 2.620 04/28/2019 10:30 AM            Discharge Exam:  Visit Vitals  BP 131/70   Pulse 80   Temp 97.5 ??F (36.4 ??C)   Resp 24   Ht 6' (1.829 m)   Wt 93 kg (205 lb)   SpO2 93%   BMI 27.80 kg/m??      General appearance: alert, cooperative, frail appearing.   Lungs: clear to auscultation bilaterally though limited breathe sounds.   Heart: regular rate and rhythm, S1, S2 normal, no murmur  Abdomen: distended, non-tender. Bowel sounds normal. JP drain in place.   Extremities: no cyanosis or edema  Neurologic: frail. Slow to answer questions today but will answer appropriately     Disposition:Home health   Discharge Condition: stable  Patient Instructions: As above   Current Discharge Medication List      START taking these medications    Details   magic mouthwash (KING) susp 5 mL by Swish and Spit route every four (4) hours.  Qty: 1 Bottle, Refills: 0      polyethylene glycol (MIRALAX) 17 gram packet Take 1 Packet by mouth daily.  Qty: 30 Packet, Refills: 0      oxyCODONE (OXYIR) 5 mg capsule Take 1 Cap by mouth every six (6) hours as needed for Pain for up to 3 days. Max Daily Amount: 20 mg.  Qty: 12 Cap, Refills: 0    Associated Diagnoses: Pain      !! apixaban (ELIQUIS) 5 mg tablet Take 2 Tabs by mouth every twelve (12) hours for 5 days.  Qty: 20 Tab, Refills: 0      !! apixaban (ELIQUIS) 5 mg tablet Take 1 Tab by mouth every twelve (12) hours.  Qty: 60 Tab, Refills: 0      furosemide (LASIX) 40 mg tablet Take 1 Tab by mouth daily as needed (volume overload, shortness of breathe).  Qty: 10 Tab, Refills: 0       !! - Potential duplicate medications found. Please discuss with provider.      CONTINUE these medications which have NOT CHANGED    Details   prucalopride 2 mg tab Take 2 mg by mouth daily.         STOP taking these medications       lisinopriL (PRINIVIL, ZESTRIL) 10 mg tablet Comments:   Reason for Stopping:               Activity: Up and lib as can tolerate, likely will need assistance   Diet:GI soft, whole milk with meals  Wound Care:Local care to JP drain     Follow-up PCP in one week. Dr. Ronny Flurry in two weeks. Dr. Sheria Lang in one week. Palliative care as outpatient.   ??      Time spent to discharge patient 35 minutes  Signed:  Scherrie Merritts, DO  05/24/2019  11:31 AM

## 2019-05-24 NOTE — Progress Notes (Signed)
Spoke with Dr Oletta Lamas, who is discharging pt home today.  Per Dr Oletta Lamas, the pt's wife does not want hospice at this time, but does want home health.  Per Dr Oletta Lamas, the pt is not experiencing pain.  I attempted to schedule an appointment for the pt to see Palliative Care during his upcoming outpatient visit with Dr Ronny Flurry, but message popped up that this had already been scheduled.

## 2019-05-24 NOTE — Other (Signed)
Bedside and Verbal shift change report given to Judson Roch, RN (oncoming nurse) by myself (offgoing nurse). Report included the following information SBAR, Kardex, MAR and Recent Results.

## 2019-05-24 NOTE — Progress Notes (Signed)
Bedside and Verbal shift change report to be given to self (oncoming nurse) by Alden Benjamin (offgoing nurse). Report included the following information SBAR, Kardex, MAR and Recent Results.

## 2019-05-24 NOTE — Progress Notes (Addendum)
Care Management Interventions  PCP Verified by CM: Yes(Dr Pruit)  Last Visit to PCP: 04/28/19  Mode of Transport at Discharge: Other (see comment)([place], Lurlean Leyden  [phone]   [phone] )  Transition of Care Consult (CM Consult): Discharge Planning, [place]: Yes  Discharge Durable Medical Equipment: Yes(Semi automatic hospital bed)  Physical Therapy Consult: No  Occupational Therapy Consult: No  Current Support Network: Lives with Spouse, Own Home  Confirm Follow Up Transport: Family  The Plan for Transition of Care is Related to the Following Treatment Goals : Home health  The Patient and/or Patient Representative was Provided with a Choice of Provider and Agrees with the Discharge Plan?: Yes  Name of the Patient Representative Who was Provided with a Choice of Provider and Agrees with the Discharge Plan: Patient and spouse  Freedom of Choice List was Provided with Basic Dialogue that Supports the Patient's Individualized Plan of Care/Goals, Treatment Preferences and Shares the Quality Data Associated with the Providers?: Yes  Veteran Resource Information Provided?: No  Discharge Location  Discharge Placement: Home with home health    CM met with pt and his spouse after speaking to Dr Oletta Lamas this AM. Dr Oletta Lamas informed CM that there is little we are doing here and recommended hospice. According to Dr Oletta Lamas, they declined at this time but wish to have [facility] with a hospital bed. CM sent referral to [facility] for RN for JP drain and SW should the situation decline and there is a need for assistance. CM checked pt's oxygen saturation and at this time he does not qualify for oxygen-room air 95%. Pt appears to be resting, eyes closed unless spoken to directly. CM has contacted [facility] to provide and set up a hospital bed today. CM awaiting timing. Pt's spouse states her daughter has set up the house for the bed's arrival. Pt's spouse is leaving to go  to fill pt's prescriptions. CM has [facility] on will call for transport.  1425 CM received a confirmation call from [place] with [facility] for bed delivery. CM told to set up pt transport for 1630 for [place] to transport. CM informed [facility]. CM to f/u with timing with spouse. CM to follow to discharge.

## 2019-05-24 NOTE — Progress Notes (Signed)
Care Management Interventions  PCP Verified by CM: Yes(Dr Pruit)  Last Visit to PCP: 04/28/19  Mode of Transport at Discharge: Other (see comment)([place], Lurlean Leyden  [phone]   [phone] )  Transition of Care Consult (CM Consult): Discharge Planning, [place]: Yes  Discharge Durable Medical Equipment: Yes(Semi automatic hospital bed)  Physical Therapy Consult: No  Occupational Therapy Consult: No  Current Support Network: Lives with Spouse, Own Home  Confirm Follow Up Transport: Family  The Plan for Transition of Care is Related to the Following Treatment Goals : Home health  The Patient and/or Patient Representative was Provided with a Choice of Provider and Agrees with the Discharge Plan?: Yes  Name of the Patient Representative Who was Provided with a Choice of Provider and Agrees with the Discharge Plan: Patient and spouse  Freedom of Choice List was Provided with Basic Dialogue that Supports the Patient's Individualized Plan of Care/Goals, Treatment Preferences and Shares the Quality Data Associated with the Providers?: Yes  Veteran Resource Information Provided?: No  Discharge Location  Discharge Placement: Home with home health    CM received a call from [place] with [facility]. She states the [facility] nurse who visited the pt today reports the family is requesting hospice care. CM wrote order for hospice consult with [facility] hospice. CM will notify by phone.

## 2019-05-25 NOTE — Progress Notes (Addendum)
Patient was admitted to [facility] on 05/15/2019 and discharged on 05/24/2019 for Pancreatic Mass.     Outreach made within 2 business days of discharge: Yes    Top Discharge Challenges to be reviewed by the provider   Additional needs identified to be addressed with provider no  none  Discussed COVID-19 related testing which was not done at this time. Test results were not done. Patient informed of results, if available? no   Method of communication with provider : none       Advance Care Planning:   Does patient have an Advance Directive:  yes; reviewed and current     Inpatient Readmission Risk score: 3  Was this a readmission? no   Patient stated reason for the admission: none  Patients top risk factors for readmission: Pancreatic Mass  Interventions to address risk factors:     Care Coordinator (CC) contacted the family by telephone to perform post hospital discharge assessment. Verified name and DOB with family as identifiers. Provided introduction to self, and explanation of the CC role.     CC reviewed discharge instructions, medical action plan and red flags with family who verbalized understanding. Family given an opportunity to ask questions and does not have any further questions or concerns at this time. The family agrees to contact the PCP office for questions related to their healthcare.     Medication reconciliation was performed with family, who verbalizes understanding of administration of home medications. Advised obtaining a 90-day supply of all daily and as-needed medications.   Referral to Pharm D needed: no     Home Health/Outpatient orders at discharge: PT, [place]: [facility]  Date of initial visit: 05/25/2019    Durable Medical Equipment ordered at discharge: yes  [place]: [facility] a hospital bed  [place] received: Yes    Covid Risk Education     Patient has following risk factors of: Pancreatic Mass. Education provided regarding infection prevention, and signs and symptoms of COVID-19 and when to seek medical attention with family who verbalized understanding. Discussed exposure protocols and quarantine From CDC: Are you at higher risk for severe illness?  and given an opportunity for questions and concerns. The family agrees to contact the COVID-19 hotline [phone] or PCP office for questions related to COVID-19.     For more information on steps you can take to protect yourself, see CDC's How to Protect Yourself     Patient/family/caregiver given information for [facility] and agrees to enroll no  Patient's preferred e-mail: declines  Patient's preferred phone number: declines    Discussed follow-up appointments. If no appointment was previously scheduled, appointment scheduling offered: yes, patient 's PCP has moved to another practice and patient's spouse will contact that provider for an appointment.  [place] follow up appointment(s): No future appointments.  Non-[facility] follow up appointment(s):   Plan for follow-up call in 10-14 days based on severity of symptoms and risk factors.  CC provided contact information for future needs.    Goals Addressed                 This Visit's Progress    ??? Attends follow up appointments on schedule       ??? Takes Medications as prescribed

## 2019-05-26 ENCOUNTER — Encounter: Admit: 2019-05-26 | Discharge: 2019-05-26 | Primary: Family Medicine

## 2019-05-28 ENCOUNTER — Encounter: Admit: 2019-05-28 | Discharge: 2019-05-28 | Payer: MEDICARE | Primary: Family Medicine

## 2019-05-29 ENCOUNTER — Encounter: Admit: 2019-05-29 | Discharge: 2019-05-29 | Payer: MEDICARE | Primary: Family Medicine

## 2019-05-30 ENCOUNTER — Encounter: Admit: 2019-05-30 | Discharge: 2019-05-30 | Payer: MEDICARE | Primary: Family Medicine

## 2019-05-31 NOTE — Hospice Non-Covered (Signed)
Primary Diagnosis  Malignant neoplasm of pancreas, unspecified location of malignancy ([place])  Related Diagnoses  Peritoneal carcinomatosis ([place])  Malignant ascites  Pulmonary embolism, unspecified chronicity, unspecified pulmonary embolism type, unspecified whether acute cor pulmonale present (HCC)  Dyspnea, unspecified type  Small bowel obstruction (HCC)  Nausea and vomiting, intractability of vomiting not specified, unspecified vomiting type  Abdominal pain, unspecified abdominal location  Hypoxia  Unrelated Diagnoses  Hypertension, unspecified type  - Reason: Not related to hospice diagnosis or prognosis at this time as it is a chronic, stable disease and has no likely effect on length of life.  Polycythemia  - Reason: Not related to hospice diagnosis or prognosis at this time as it is a chronic, stable disease and has no likely effect on length of life.  Spinal stenosis of lumbar region with neurogenic claudication  - Reason: Not related to hospice diagnosis or prognosis at this time as it is a chronic, stable disease and has no likely effect on length of life.        Not Covered Drugs  apixaban (ELIQUIS) 5 mg tablet  - Reason:  Finishing the medication on hand and changed to Coumadin        All items are covered.        Not Covered Services   Routine Hospital or [place] Visits

## 2019-06-01 ENCOUNTER — Encounter: Admit: 2019-06-01 | Discharge: 2019-06-01 | Payer: MEDICARE | Primary: Family Medicine

## 2019-06-02 ENCOUNTER — Encounter: Admit: 2019-06-02 | Discharge: 2019-06-02 | Payer: MEDICARE | Primary: Family Medicine

## 2019-06-03 ENCOUNTER — Encounter: Primary: Family Medicine

## 2019-06-08 ENCOUNTER — Encounter: Primary: Family Medicine

## 2019-06-10 ENCOUNTER — Encounter: Primary: Family Medicine

## 2019-06-15 ENCOUNTER — Encounter: Primary: Family Medicine

## 2019-06-17 ENCOUNTER — Encounter: Primary: Family Medicine

## 2019-06-22 ENCOUNTER — Encounter: Primary: Family Medicine

## 2019-06-24 ENCOUNTER — Encounter: Primary: Family Medicine

## 2019-06-29 ENCOUNTER — Encounter: Primary: Family Medicine

## 2019-06-29 DEATH — deceased

## 2019-07-01 ENCOUNTER — Encounter: Primary: Family Medicine

## 2019-07-06 ENCOUNTER — Encounter: Primary: Family Medicine

## 2019-07-08 ENCOUNTER — Encounter: Primary: Family Medicine

## 2019-07-13 ENCOUNTER — Encounter: Primary: Family Medicine

## 2019-07-15 ENCOUNTER — Encounter: Primary: Family Medicine

## 2019-07-20 ENCOUNTER — Encounter: Primary: Family Medicine

## 2019-07-22 ENCOUNTER — Encounter: Primary: Family Medicine

## 2019-07-27 ENCOUNTER — Encounter: Primary: Family Medicine

## 2019-07-29 ENCOUNTER — Encounter: Primary: Family Medicine

## 2019-08-03 ENCOUNTER — Encounter: Primary: Family Medicine

## 2019-08-05 ENCOUNTER — Encounter: Primary: Family Medicine

## 2019-08-10 ENCOUNTER — Encounter: Primary: Family Medicine

## 2019-08-12 ENCOUNTER — Encounter: Primary: Family Medicine

## 2019-08-17 ENCOUNTER — Encounter: Primary: Family Medicine

## 2019-08-19 ENCOUNTER — Encounter: Primary: Family Medicine

## 2019-08-24 ENCOUNTER — Encounter: Primary: Family Medicine

## 2020-03-04 ENCOUNTER — Emergency Department (HOSPITAL_COMMUNITY): Payer: Medicare HMO

## 2020-03-04 ENCOUNTER — Other Ambulatory Visit: Payer: Self-pay

## 2020-03-04 ENCOUNTER — Inpatient Hospital Stay (HOSPITAL_COMMUNITY)
Admission: EM | Admit: 2020-03-04 | Discharge: 2020-03-05 | DRG: 181 | Disposition: A | Discharge status: Home / Self Care | Payer: Medicare HMO | Attending: Internal Medicine | Admitting: Internal Medicine

## 2020-03-04 ENCOUNTER — Encounter (HOSPITAL_COMMUNITY): Payer: Self-pay | Admitting: Emergency Medicine

## 2020-03-04 DIAGNOSIS — C3491 Malignant neoplasm of unspecified part of right bronchus or lung: Principal | ICD-10-CM | POA: Diagnosis present

## 2020-03-04 DIAGNOSIS — C3431 Malignant neoplasm of lower lobe, right bronchus or lung: Secondary | ICD-10-CM

## 2020-03-04 DIAGNOSIS — Z7984 Long term (current) use of oral hypoglycemic drugs: Secondary | ICD-10-CM | POA: Diagnosis not present

## 2020-03-04 DIAGNOSIS — E876 Hypokalemia: Secondary | ICD-10-CM | POA: Diagnosis not present

## 2020-03-04 DIAGNOSIS — I10 Essential (primary) hypertension: Secondary | ICD-10-CM | POA: Diagnosis present

## 2020-03-04 DIAGNOSIS — Z87891 Personal history of nicotine dependence: Secondary | ICD-10-CM

## 2020-03-04 DIAGNOSIS — J440 Chronic obstructive pulmonary disease with acute lower respiratory infection: Secondary | ICD-10-CM | POA: Diagnosis not present

## 2020-03-04 DIAGNOSIS — D649 Anemia, unspecified: Secondary | ICD-10-CM | POA: Diagnosis not present

## 2020-03-04 DIAGNOSIS — E871 Hypo-osmolality and hyponatremia: Secondary | ICD-10-CM | POA: Diagnosis not present

## 2020-03-04 DIAGNOSIS — R634 Abnormal weight loss: Secondary | ICD-10-CM | POA: Diagnosis present

## 2020-03-04 DIAGNOSIS — M79602 Pain in left arm: Secondary | ICD-10-CM | POA: Diagnosis present

## 2020-03-04 DIAGNOSIS — R7989 Other specified abnormal findings of blood chemistry: Secondary | ICD-10-CM | POA: Diagnosis present

## 2020-03-04 DIAGNOSIS — Z7982 Long term (current) use of aspirin: Secondary | ICD-10-CM

## 2020-03-04 DIAGNOSIS — R000 Tachycardia, unspecified: Secondary | ICD-10-CM | POA: Diagnosis not present

## 2020-03-04 DIAGNOSIS — K5909 Other constipation: Secondary | ICD-10-CM | POA: Diagnosis present

## 2020-03-04 DIAGNOSIS — T17908S Unspecified foreign body in respiratory tract, part unspecified causing other injury, sequela: Secondary | ICD-10-CM

## 2020-03-04 DIAGNOSIS — I69354 Hemiplegia and hemiparesis following cerebral infarction affecting left non-dominant side: Secondary | ICD-10-CM

## 2020-03-04 DIAGNOSIS — D509 Iron deficiency anemia, unspecified: Secondary | ICD-10-CM | POA: Diagnosis present

## 2020-03-04 DIAGNOSIS — Z79899 Other long term (current) drug therapy: Secondary | ICD-10-CM | POA: Diagnosis not present

## 2020-03-04 DIAGNOSIS — E119 Type 2 diabetes mellitus without complications: Secondary | ICD-10-CM | POA: Diagnosis not present

## 2020-03-04 DIAGNOSIS — R918 Other nonspecific abnormal finding of lung field: Secondary | ICD-10-CM | POA: Diagnosis not present

## 2020-03-04 DIAGNOSIS — Z20822 Contact with and (suspected) exposure to covid-19: Secondary | ICD-10-CM | POA: Diagnosis present

## 2020-03-04 DIAGNOSIS — R0602 Shortness of breath: Secondary | ICD-10-CM

## 2020-03-04 DIAGNOSIS — R0600 Dyspnea, unspecified: Secondary | ICD-10-CM | POA: Diagnosis not present

## 2020-03-04 DIAGNOSIS — Z6826 Body mass index (BMI) 26.0-26.9, adult: Secondary | ICD-10-CM | POA: Diagnosis not present

## 2020-03-04 DIAGNOSIS — R042 Hemoptysis: Secondary | ICD-10-CM | POA: Diagnosis not present

## 2020-03-04 DIAGNOSIS — Z9114 Patient's other noncompliance with medication regimen: Secondary | ICD-10-CM

## 2020-03-04 DIAGNOSIS — J690 Pneumonitis due to inhalation of food and vomit: Secondary | ICD-10-CM | POA: Diagnosis not present

## 2020-03-04 DIAGNOSIS — E785 Hyperlipidemia, unspecified: Secondary | ICD-10-CM | POA: Diagnosis present

## 2020-03-04 LAB — BASIC METABOLIC PANEL
Anion gap: 15 (ref 5–15)
BUN: 11 mg/dL (ref 8–23)
CO2: 18 mmol/L — ABNORMAL LOW (ref 22–32)
Calcium: 8.7 mg/dL — ABNORMAL LOW (ref 8.9–10.3)
Chloride: 101 mmol/L (ref 98–111)
Creatinine, Ser: 0.99 mg/dL (ref 0.61–1.24)
GFR calc Af Amer: >60 mL/min (ref >60)
GFR calc non Af Amer: >60 mL/min (ref >60)
Glucose, Bld: 149 mg/dL — ABNORMAL HIGH (ref 70–99)
Potassium: 3 mmol/L — ABNORMAL LOW (ref 3.5–5.1)
Sodium: 134 mmol/L — ABNORMAL LOW (ref 135–145)

## 2020-03-04 LAB — HEMOGLOBIN A1C
Hgb A1c MFr Bld: 6.6 % — ABNORMAL HIGH (ref 4.8–5.6)
Mean Plasma Glucose: 142.72 mg/dL

## 2020-03-04 LAB — CBC
HCT: 32.4 % — ABNORMAL LOW (ref 39.0–52.0)
HCT: 31.3 % — ABNORMAL LOW (ref 39.0–52.0)
Hemoglobin: 9.4 g/dL — ABNORMAL LOW (ref 13.0–17.0)
Hemoglobin: 9.1 g/dL — ABNORMAL LOW (ref 13.0–17.0)
MCH: 21.9 pg — ABNORMAL LOW (ref 26.0–34.0)
MCH: 22.2 pg — ABNORMAL LOW (ref 26.0–34.0)
MCHC: 29 g/dL — ABNORMAL LOW (ref 30.0–36.0)
MCHC: 29.1 g/dL — ABNORMAL LOW (ref 30.0–36.0)
MCV: 75.3 fL — ABNORMAL LOW (ref 80.0–100.0)
MCV: 76.3 fL — ABNORMAL LOW (ref 80.0–100.0)
Platelets: 470 10*3/uL — ABNORMAL HIGH (ref 150–400)
Platelets: 358 10*3/uL (ref 150–400)
RBC: 4.3 MIL/uL (ref 4.22–5.81)
RBC: 4.1 MIL/uL — ABNORMAL LOW (ref 4.22–5.81)
RDW: 18.6 % — ABNORMAL HIGH (ref 11.5–15.5)
RDW: 18.7 % — ABNORMAL HIGH (ref 11.5–15.5)
WBC: 12.5 10*3/uL — ABNORMAL HIGH (ref 4.0–10.5)
WBC: 9.6 10*3/uL (ref 4.0–10.5)
nRBC: 0 % (ref 0.0–0.2)
nRBC: 0 % (ref 0.0–0.2)

## 2020-03-04 LAB — CBG MONITORING, ED: Glucose-Capillary: 103 mg/dL — ABNORMAL HIGH (ref 70–99)

## 2020-03-04 LAB — FERRITIN: Ferritin: 265 ng/mL (ref 24–336)

## 2020-03-04 LAB — IRON AND TIBC
Iron: 43 ug/dL — ABNORMAL LOW (ref 45–182)
Saturation Ratios: 29 % (ref 17.9–39.5)
TIBC: 148 ug/dL — ABNORMAL LOW (ref 250–450)
UIBC: 105 ug/dL

## 2020-03-04 LAB — CREATININE, SERUM
Creatinine, Ser: 1.01 mg/dL (ref 0.61–1.24)
GFR calc Af Amer: >60 mL/min (ref >60)
GFR calc non Af Amer: >60 mL/min (ref >60)

## 2020-03-04 LAB — MAGNESIUM: Magnesium: 1.9 mg/dL (ref 1.7–2.4)

## 2020-03-04 LAB — TROPONIN I (HIGH SENSITIVITY)
Troponin I (High Sensitivity): 6 ng/L (ref <18)
Troponin I (High Sensitivity): 7 ng/L (ref <18)

## 2020-03-04 LAB — PHOSPHORUS: Phosphorus: 3.1 mg/dL (ref 2.5–4.6)

## 2020-03-04 LAB — SARS CORONAVIRUS 2 BY RT PCR (HOSPITAL ORDER, PERFORMED IN ~~LOC~~ HOSPITAL LAB): SARS Coronavirus 2: NEGATIVE

## 2020-03-04 LAB — GLUCOSE, CAPILLARY: Glucose-Capillary: 142 mg/dL — ABNORMAL HIGH (ref 70–99)

## 2020-03-04 LAB — PROCALCITONIN: Procalcitonin: 0.12 ng/mL

## 2020-03-04 MED ORDER — DOCUSATE SODIUM 100 MG PO CAPS
200.0000 mg | ORAL_CAPSULE | Freq: Two times a day (BID) | ORAL | Status: DC
  Administered 2020-03-04 – 2020-03-05 (×2): 200 mg via ORAL
  Filled 2020-03-04 (×2): qty 2

## 2020-03-04 MED ORDER — BUDESONIDE 0.25 MG/2ML IN SUSP
0.2500 mg | Freq: Two times a day (BID) | RESPIRATORY_TRACT | Status: DC
  Administered 2020-03-04 – 2020-03-05 (×2): 0.25 mg via RESPIRATORY_TRACT
  Filled 2020-03-04 (×4): qty 2

## 2020-03-04 MED ORDER — ACETAMINOPHEN 650 MG RE SUPP: 650.0000 mg | Freq: Four times a day (QID) | RECTAL | Status: DC | PRN

## 2020-03-04 MED ORDER — SODIUM CHLORIDE 0.9 % IV BOLUS
1000.0000 mL | Freq: Once | INTRAVENOUS | Status: AC
  Administered 2020-03-04: 1000 mL via INTRAVENOUS

## 2020-03-04 MED ORDER — ONDANSETRON HCL 4 MG/2ML IJ SOLN: 4.0000 mg | Freq: Four times a day (QID) | INTRAMUSCULAR | Status: DC | PRN

## 2020-03-04 MED ORDER — CARVEDILOL 6.25 MG PO TABS
6.2500 mg | ORAL_TABLET | Freq: Two times a day (BID) | ORAL | Status: DC
  Administered 2020-03-04 – 2020-03-05 (×2): 6.25 mg via ORAL
  Filled 2020-03-04 (×2): qty 1

## 2020-03-04 MED ORDER — ONDANSETRON HCL 4 MG PO TABS: 4.0000 mg | ORAL_TABLET | Freq: Four times a day (QID) | ORAL | Status: DC | PRN

## 2020-03-04 MED ORDER — POTASSIUM CHLORIDE CRYS ER 20 MEQ PO TBCR
40.0000 meq | EXTENDED_RELEASE_TABLET | Freq: Once | ORAL | Status: AC
  Administered 2020-03-04: 40 meq via ORAL
  Filled 2020-03-04: qty 2

## 2020-03-04 MED ORDER — ENOXAPARIN SODIUM 40 MG/0.4ML ~~LOC~~ SOLN
40.0000 mg | SUBCUTANEOUS | Status: DC
  Administered 2020-03-04: 40 mg via SUBCUTANEOUS
  Filled 2020-03-04: qty 0.4

## 2020-03-04 MED ORDER — INSULIN ASPART 100 UNIT/ML ~~LOC~~ SOLN: 0.0000 [IU] | Freq: Every day | SUBCUTANEOUS | Status: DC

## 2020-03-04 MED ORDER — SODIUM CHLORIDE 0.9 % IV SOLN: 500.0000 mg | INTRAVENOUS | Status: DC

## 2020-03-04 MED ORDER — SODIUM CHLORIDE 0.9 % IV SOLN
1.0000 g | Freq: Once | INTRAVENOUS | Status: AC
  Administered 2020-03-04: 1 g via INTRAVENOUS
  Filled 2020-03-04: qty 10

## 2020-03-04 MED ORDER — ARFORMOTEROL TARTRATE 15 MCG/2ML IN NEBU
15.0000 ug | INHALATION_SOLUTION | Freq: Two times a day (BID) | RESPIRATORY_TRACT | Status: DC
  Administered 2020-03-04 – 2020-03-05 (×2): 15 ug via RESPIRATORY_TRACT
  Filled 2020-03-04 (×4): qty 2

## 2020-03-04 MED ORDER — ACETAMINOPHEN 325 MG PO TABS: 650.0000 mg | ORAL_TABLET | Freq: Four times a day (QID) | ORAL | Status: DC | PRN

## 2020-03-04 MED ORDER — SODIUM CHLORIDE 0.9% FLUSH: 3.0000 mL | Freq: Once | INTRAVENOUS | Status: DC

## 2020-03-04 MED ORDER — INSULIN ASPART 100 UNIT/ML ~~LOC~~ SOLN
0.0000 [IU] | Freq: Three times a day (TID) | SUBCUTANEOUS | Status: DC
  Administered 2020-03-05: 2 [IU] via SUBCUTANEOUS

## 2020-03-04 MED ORDER — OXYCODONE HCL 5 MG PO TABS
5.0000 mg | ORAL_TABLET | ORAL | Status: DC | PRN
  Administered 2020-03-05: 5 mg via ORAL
  Filled 2020-03-04: qty 1

## 2020-03-04 MED ORDER — ATORVASTATIN CALCIUM 80 MG PO TABS
80.0000 mg | ORAL_TABLET | Freq: Every day | ORAL | Status: DC
  Administered 2020-03-04 – 2020-03-05 (×2): 80 mg via ORAL
  Filled 2020-03-04 (×2): qty 1

## 2020-03-04 MED ORDER — HYDROMORPHONE HCL 1 MG/ML IJ SOLN: 0.5000 mg | INTRAMUSCULAR | Status: DC | PRN

## 2020-03-04 MED ORDER — METHYLPREDNISOLONE SODIUM SUCC 40 MG IJ SOLR
40.0000 mg | Freq: Every day | INTRAMUSCULAR | Status: DC
  Administered 2020-03-04 – 2020-03-05 (×2): 40 mg via INTRAVENOUS
  Filled 2020-03-04 (×2): qty 1

## 2020-03-04 MED ORDER — SODIUM CHLORIDE 0.9 % IV SOLN: 1.0000 g | INTRAVENOUS | Status: DC

## 2020-03-04 MED ORDER — REVEFENACIN 175 MCG/3ML IN SOLN
175.0000 ug | Freq: Every day | RESPIRATORY_TRACT | Status: DC
  Administered 2020-03-04: 175 ug via RESPIRATORY_TRACT
  Filled 2020-03-04 (×3): qty 3

## 2020-03-04 MED ORDER — ASPIRIN 325 MG PO TABS
325.0000 mg | ORAL_TABLET | Freq: Every day | ORAL | Status: DC
  Administered 2020-03-04: 325 mg via ORAL
  Filled 2020-03-04: qty 1

## 2020-03-04 MED ORDER — POTASSIUM CHLORIDE 20 MEQ/15ML (10%) PO SOLN
40.0000 meq | Freq: Once | ORAL | Status: AC
  Administered 2020-03-04: 40 meq via ORAL
  Filled 2020-03-04: qty 30

## 2020-03-04 MED ORDER — OXYCODONE-ACETAMINOPHEN 5-325 MG PO TABS
1.0000 | ORAL_TABLET | Freq: Once | ORAL | Status: AC
  Administered 2020-03-04: 1 via ORAL
  Filled 2020-03-04: qty 1

## 2020-03-04 MED ORDER — IOHEXOL 350 MG/ML SOLN
75.0000 mL | Freq: Once | INTRAVENOUS | Status: AC | PRN
  Administered 2020-03-04: 75 mL via INTRAVENOUS

## 2020-03-04 MED ORDER — SODIUM CHLORIDE 0.9 % IV SOLN
500.0000 mg | Freq: Once | INTRAVENOUS | Status: AC
  Administered 2020-03-04: 500 mg via INTRAVENOUS
  Filled 2020-03-04: qty 500

## 2020-03-04 NOTE — Consult Note (Signed)
NAME:  Scott Conley, MRN:  [id number], DOB:  04/17/1941, LOS: 0 ADMISSION DATE:  03/04/2020, CONSULTATION DATE: 59 REFERRING MD: Dr. Doristine Bosworth, CHIEF COMPLAINT: Lung mass  Brief History   79 year old former smoker quit 2015, hemoptysis the past several weeks, lethargy admitted for evaluation of right lung mass.  History of present illness   This is an 79 year old male, history of CVA, type 2 diabetes, former smoker quit 2015, presents to the ER with complaints of shortness of breath.  He states that he has had congestion with the chest for the past month or more.  He has noticed a few episodes of hemoptysis.  Sputum streaked with blood.  Pulmonary was consulted for recommendations management following CT scan of the chest which revealed large right-sided hilar mass.  Past Medical History   Past Medical History:  Diagnosis Date  . Arthritis   . Blood clot in vein   . CVA (cerebral infarction)   . Diabetes ([place])   . Hypertension   . Stroke [facility])    Left hand stiffness      Significant Hospital Events     Consults:    Procedures:  03/05/2020: Bronchoscopy pending  Significant Diagnostic Tests:    Micro Data:    Antimicrobials:     Interim history/subjective:    Objective   Blood pressure 137/77, pulse 86, temperature 98.1 F (36.7 C), temperature source Oral, resp. rate 16, weight 120.6 kg, SpO2 97 %.       No intake or output data in the 24 hours ending 03/04/20 1522 Filed Weights   03/04/20 0851  Weight: 120.6 kg    Examination: General: Elderly male, resting in bed HENT: NCAT Lungs: Diminished bilaterally, rhonchi right chest Cardiovascular: Regular rhythm, S1S2 Abdomen: Soft, nontender nondistended Extremities: no edema  Neuro: AAOX3 GU: deferred   Resolved Hospital Problem list     Assessment & Plan:   Right Hilar Mass Hemoptysis  Concerning for a primary bronchogenic carcinoma  Likely COPD, some chest tightness, possible AECOPD Former  smoker Plan: Medical optimization prior to bronchoscopy  Agree with abx and steroids  Solumedrol 40mg  daily  Scheduled Nebs, pulmicort, brovana, yupelri  Discussed R/B/A of proceeding with bronchoscopy Patient is agreeable  Plans for bronchoscopy tomorrow 9am  Special thanks to ENDO team for making it work  NPO midnight   H/o CVA  HTN DMII   Hypokalemia - replete prior to case tomorrow  Leukocytosis  - likely reactive  - no clear pna on ct   Labs   CBC: Recent Labs  Lab 03/04/20 0857  WBC 12.5*  HGB 9.4*  HCT 32.4*  MCV 75.3*  PLT 470*    Basic Metabolic Panel: Recent Labs  Lab 03/04/20 0857  NA 134*  K 3.0*  CL 101  CO2 18*  GLUCOSE 149*  BUN 11  CREATININE 0.99  CALCIUM 8.7*   GFR: CrCl cannot be calculated (Unknown ideal weight.). Recent Labs  Lab 03/04/20 0857  WBC 12.5*    Liver Function Tests: No results for input(s): AST, ALT, ALKPHOS, BILITOT, PROT, ALBUMIN in the last 168 hours. No results for input(s): LIPASE, AMYLASE in the last 168 hours. No results for input(s): AMMONIA in the last 168 hours.  ABG    Component Value Date/Time   TCO2 23 11/26/2013 1738     Coagulation Profile: No results for input(s): INR, PROTIME in the last 168 hours.  Cardiac Enzymes: No results for input(s): CKTOTAL, CKMB, CKMBINDEX, TROPONINI in the last 168 hours.  HbA1C: Hgb A1c MFr Bld  Date/Time Value Ref Range Status  11/21/2016 10:58 AM 7.2 (H) 4.6 - 6.5 % Final    Comment:    Glycemic Control Guidelines for People with Diabetes:Non Diabetic:  <6%Goal of Therapy: <7%Additional Action Suggested:  >8%   11/21/2015 01:52 PM 7.3 (H) 4.6 - 6.5 % Final    Comment:    Glycemic Control Guidelines for People with Diabetes:Non Diabetic:  <6%Goal of Therapy: <7%Additional Action Suggested:  >8%     CBG: No results for input(s): GLUCAP in the last 168 hours.  Review of Systems:   Review of Systems  Constitutional: Negative for chills, fever,  malaise/fatigue and weight loss.  HENT: Negative for hearing loss, sore throat and tinnitus.   Eyes: Negative for blurred vision and double vision.  Respiratory: Positive for cough, hemoptysis, sputum production and shortness of breath. Negative for wheezing and stridor.   Cardiovascular: Negative for chest pain, palpitations, orthopnea, leg swelling and PND.  Gastrointestinal: Negative for abdominal pain, constipation, diarrhea, heartburn, nausea and vomiting.  Genitourinary: Negative for dysuria, hematuria and urgency.  Musculoskeletal: Negative for joint pain and myalgias.  Skin: Negative for itching and rash.  Neurological: Positive for weakness. Negative for dizziness, tingling and headaches.  Endo/Heme/Allergies: Negative for environmental allergies. Does not bruise/bleed easily.  Psychiatric/Behavioral: Negative for depression. The patient is not nervous/anxious and does not have insomnia.   All other systems reviewed and are negative.    Past Medical History  He,  has a past medical history of Arthritis, Blood clot in vein, CVA (cerebral infarction), Diabetes ([place]), Hypertension, and Stroke ([place]).   Surgical History    Past Surgical History:  Procedure Laterality Date  . CYST REMOVAL LEG     removed from right groin  . LOOP RECORDER IMPLANT  11/29/13   MDT LinQ implanted by Dr Caryl Comes for cryptogenic stroke  . LOOP RECORDER IMPLANT N/A 11/29/2013   Procedure: LOOP RECORDER IMPLANT;  Surgeon: Deboraha Sprang, MD;  Location: [facility] CATH LAB;  Service: Cardiovascular;  Laterality: N/A;  . TEE WITHOUT CARDIOVERSION N/A 11/29/2013   Procedure: TRANSESOPHAGEAL ECHOCARDIOGRAM (TEE);  Surgeon: Sueanne Margarita, MD;  Location: [facility] ENDOSCOPY;  Service: Cardiovascular;  Laterality: N/A;     Social History   reports that he has quit smoking. His smoking use included cigarettes. He started smoking about 6 years ago. He has a 29.00 pack-year smoking history. He has never used smokeless tobacco. He reports  that he does not drink alcohol and does not use drugs.   Family History   His family history includes Breast cancer in his maternal grandmother and mother.   Allergies Not on File   Home Medications  Prior to Admission medications   Medication Sig Start Date End Date Taking? Authorizing Provider  aspirin 325 MG tablet Take 1 tablet (325 mg total) by mouth daily. New blood thinner to prevent strokes 12/10/13  Yes Love, Ivan Anchors, PA-C  docusate sodium (COLACE) 100 MG capsule Take 200 mg by mouth 2 (two) times daily.   Yes [provider]  atorvastatin (LIPITOR) 80 MG tablet Take 1 tablet (80 mg total) by mouth daily. Must keep appt w/new provider for future refills Patient not taking: Reported on 03/04/2020 06/23/17   Biagio Borg, MD  carvedilol (COREG) 6.25 MG tablet Take 1 tablet (6.25 mg total) by mouth 2 (two) times daily with a meal. Patient not taking: Reported on 03/04/2020 11/21/16   Golden Circle, FNP  ramipril (ALTACE) 2.5  MG capsule TAKE 1 CAPSULE BY MOUTH  DAILY ( MUST KEEP APPOINTMENT WITH NEW PROVIDER FOR FUTURE REFILLS) Patient not taking: Reported on 03/04/2020 08/21/17   Biagio Borg, MD  senna-docusate (SENOKOT-S) 8.6-50 MG per tablet Take 1 tablet by mouth at bedtime. For constipation Patient not taking: Reported on 03/04/2020 03/01/14   Philmore Pali, NP  sitaGLIPtin (JANUVIA) 100 MG tablet Take 1 tablet (100 mg total) by mouth daily. Must keep appt w/new provider for future refills Patient not taking: Reported on 03/04/2020 06/23/17   Biagio Borg, MD     Garner Nash, DO [facility] 03/04/2020 3:30 PM

## 2020-03-04 NOTE — H&P (View-Only) (Signed)
NAME:  Scott AMIRAULT, MRN:  [id number], DOB:  Feb 05, 1941, LOS: 0 ADMISSION DATE:  03/04/2020, CONSULTATION DATE: 60 REFERRING MD: Dr. Doristine Bosworth, CHIEF COMPLAINT: Lung mass  Brief History   79 year old former smoker quit 2015, hemoptysis the past several weeks, lethargy admitted for evaluation of right lung mass.  History of present illness   This is an 79 year old male, history of CVA, type 2 diabetes, former smoker quit 2015, presents to the ER with complaints of shortness of breath.  He states that he has had congestion with the chest for the past month or more.  He has noticed a few episodes of hemoptysis.  Sputum streaked with blood.  Pulmonary was consulted for recommendations management following CT scan of the chest which revealed large right-sided hilar mass.  Past Medical History   Past Medical History:  Diagnosis Date  . Arthritis   . Blood clot in vein   . CVA (cerebral infarction)   . Diabetes ([place])   . Hypertension   . Stroke [facility])    Left hand stiffness      Significant Hospital Events     Consults:    Procedures:  03/05/2020: Bronchoscopy pending  Significant Diagnostic Tests:    Micro Data:    Antimicrobials:     Interim history/subjective:    Objective   Blood pressure 137/77, pulse 86, temperature 98.1 F (36.7 C), temperature source Oral, resp. rate 16, weight 120.6 kg, SpO2 97 %.       No intake or output data in the 24 hours ending 03/04/20 1522 Filed Weights   03/04/20 0851  Weight: 120.6 kg    Examination: General: Elderly male, resting in bed HENT: NCAT Lungs: Diminished bilaterally, rhonchi right chest Cardiovascular: Regular rhythm, S1S2 Abdomen: Soft, nontender nondistended Extremities: no edema  Neuro: AAOX3 GU: deferred   Resolved Hospital Problem list     Assessment & Plan:   Right Hilar Mass Hemoptysis  Concerning for a primary bronchogenic carcinoma  Likely COPD, some chest tightness, possible AECOPD Former  smoker Plan: Medical optimization prior to bronchoscopy  Agree with abx and steroids  Solumedrol 40mg  daily  Scheduled Nebs, pulmicort, brovana, yupelri  Discussed R/B/A of proceeding with bronchoscopy Patient is agreeable  Plans for bronchoscopy tomorrow 9am  Special thanks to ENDO team for making it work  NPO midnight   H/o CVA  HTN DMII   Hypokalemia - replete prior to case tomorrow  Leukocytosis  - likely reactive  - no clear pna on ct   Labs   CBC: Recent Labs  Lab 03/04/20 0857  WBC 12.5*  HGB 9.4*  HCT 32.4*  MCV 75.3*  PLT 470*    Basic Metabolic Panel: Recent Labs  Lab 03/04/20 0857  NA 134*  K 3.0*  CL 101  CO2 18*  GLUCOSE 149*  BUN 11  CREATININE 0.99  CALCIUM 8.7*   GFR: CrCl cannot be calculated (Unknown ideal weight.). Recent Labs  Lab 03/04/20 0857  WBC 12.5*    Liver Function Tests: No results for input(s): AST, ALT, ALKPHOS, BILITOT, PROT, ALBUMIN in the last 168 hours. No results for input(s): LIPASE, AMYLASE in the last 168 hours. No results for input(s): AMMONIA in the last 168 hours.  ABG    Component Value Date/Time   TCO2 23 11/26/2013 1738     Coagulation Profile: No results for input(s): INR, PROTIME in the last 168 hours.  Cardiac Enzymes: No results for input(s): CKTOTAL, CKMB, CKMBINDEX, TROPONINI in the last 168 hours.  HbA1C: Hgb A1c MFr Bld  Date/Time Value Ref Range Status  11/21/2016 10:58 AM 7.2 (H) 4.6 - 6.5 % Final    Comment:    Glycemic Control Guidelines for People with Diabetes:Non Diabetic:  <6%Goal of Therapy: <7%Additional Action Suggested:  >8%   11/21/2015 01:52 PM 7.3 (H) 4.6 - 6.5 % Final    Comment:    Glycemic Control Guidelines for People with Diabetes:Non Diabetic:  <6%Goal of Therapy: <7%Additional Action Suggested:  >8%     CBG: No results for input(s): GLUCAP in the last 168 hours.  Review of Systems:   Review of Systems  Constitutional: Negative for chills, fever,  malaise/fatigue and weight loss.  HENT: Negative for hearing loss, sore throat and tinnitus.   Eyes: Negative for blurred vision and double vision.  Respiratory: Positive for cough, hemoptysis, sputum production and shortness of breath. Negative for wheezing and stridor.   Cardiovascular: Negative for chest pain, palpitations, orthopnea, leg swelling and PND.  Gastrointestinal: Negative for abdominal pain, constipation, diarrhea, heartburn, nausea and vomiting.  Genitourinary: Negative for dysuria, hematuria and urgency.  Musculoskeletal: Negative for joint pain and myalgias.  Skin: Negative for itching and rash.  Neurological: Positive for weakness. Negative for dizziness, tingling and headaches.  Endo/Heme/Allergies: Negative for environmental allergies. Does not bruise/bleed easily.  Psychiatric/Behavioral: Negative for depression. The patient is not nervous/anxious and does not have insomnia.   All other systems reviewed and are negative.    Past Medical History  He,  has a past medical history of Arthritis, Blood clot in vein, CVA (cerebral infarction), Diabetes ([place]), Hypertension, and Stroke ([place]).   Surgical History    Past Surgical History:  Procedure Laterality Date  . CYST REMOVAL LEG     removed from right groin  . LOOP RECORDER IMPLANT  11/29/13   MDT LinQ implanted by Dr Caryl Comes for cryptogenic stroke  . LOOP RECORDER IMPLANT N/A 11/29/2013   Procedure: LOOP RECORDER IMPLANT;  Surgeon: Deboraha Sprang, MD;  Location: [facility] CATH LAB;  Service: Cardiovascular;  Laterality: N/A;  . TEE WITHOUT CARDIOVERSION N/A 11/29/2013   Procedure: TRANSESOPHAGEAL ECHOCARDIOGRAM (TEE);  Surgeon: Sueanne Margarita, MD;  Location: [facility] ENDOSCOPY;  Service: Cardiovascular;  Laterality: N/A;     Social History   reports that he has quit smoking. His smoking use included cigarettes. He started smoking about 6 years ago. He has a 29.00 pack-year smoking history. He has never used smokeless tobacco. He reports  that he does not drink alcohol and does not use drugs.   Family History   His family history includes Breast cancer in his maternal grandmother and mother.   Allergies Not on File   Home Medications  Prior to Admission medications   Medication Sig Start Date End Date Taking? Authorizing Provider  aspirin 325 MG tablet Take 1 tablet (325 mg total) by mouth daily. New blood thinner to prevent strokes 12/10/13  Yes Love, Ivan Anchors, PA-C  docusate sodium (COLACE) 100 MG capsule Take 200 mg by mouth 2 (two) times daily.   Yes [provider]  atorvastatin (LIPITOR) 80 MG tablet Take 1 tablet (80 mg total) by mouth daily. Must keep appt w/new provider for future refills Patient not taking: Reported on 03/04/2020 06/23/17   Biagio Borg, MD  carvedilol (COREG) 6.25 MG tablet Take 1 tablet (6.25 mg total) by mouth 2 (two) times daily with a meal. Patient not taking: Reported on 03/04/2020 11/21/16   Golden Circle, FNP  ramipril (ALTACE) 2.5  MG capsule TAKE 1 CAPSULE BY MOUTH  DAILY ( MUST KEEP APPOINTMENT WITH NEW PROVIDER FOR FUTURE REFILLS) Patient not taking: Reported on 03/04/2020 08/21/17   Biagio Borg, MD  senna-docusate (SENOKOT-S) 8.6-50 MG per tablet Take 1 tablet by mouth at bedtime. For constipation Patient not taking: Reported on 03/04/2020 03/01/14   Philmore Pali, NP  sitaGLIPtin (JANUVIA) 100 MG tablet Take 1 tablet (100 mg total) by mouth daily. Must keep appt w/new provider for future refills Patient not taking: Reported on 03/04/2020 06/23/17   Biagio Borg, MD     Garner Nash, DO [facility] 03/04/2020 3:30 PM

## 2020-03-04 NOTE — ED Provider Notes (Signed)
[facility] EMERGENCY DEPARTMENT Provider Note   CSN: [id number] Arrival date & time: 03/04/20  2409     History Chief Complaint  Patient presents with  . Rib Pain  . Shortness of Breath  . Constipation    ZAYDYN HAVEY is a 79 y.o. male.  HPI 79 yo male ho dm, hypertension, stroke presents today complaining of one month of dyspnea, back pain, and left arm pain.  Patient with ongoing nonproductive cough.  Pain in mid back but not lateralized.  No fever, chills, no ho covid, patient not vaccinated.  Decreased appetite for several days with no bm since Tuesday.  No report of brbpr or abdominal pain.  Patient reports urinating regularly.  Left arm pain intermittent described as sharp and stinging.  No neck pain.  Denies leg swelling or chest pain.     Past Medical History:  Diagnosis Date  . Arthritis   . Blood clot in vein   . CVA (cerebral infarction)   . Diabetes ([place])   . Hypertension   . Stroke [facility])    Left hand stiffness     Patient Active Problem List   Diagnosis Date Noted  . Medicare annual wellness visit, subsequent 11/21/2016  . Obesity 11/21/2016  . Type 2 diabetes mellitus ([place]) 11/21/2015  . Essential hypertension 11/21/2015  . Low back strain 11/21/2015  . Tobacco use 03/01/2014  . Ataxia, late effect of cerebrovascular disease 01/18/2014  . CVA (cerebral infarction) 11/30/2013  . Stroke [facility]) 11/26/2013    Past Surgical History:  Procedure Laterality Date  . CYST REMOVAL LEG     removed from right groin  . LOOP RECORDER IMPLANT  11/29/13   MDT LinQ implanted by Dr Caryl Comes for cryptogenic stroke  . LOOP RECORDER IMPLANT N/A 11/29/2013   Procedure: LOOP RECORDER IMPLANT;  Surgeon: Deboraha Sprang, MD;  [facility] CATH LAB;  Service: Cardiovascular;  Laterality: N/A;  . TEE WITHOUT CARDIOVERSION N/A 11/29/2013   Procedure: TRANSESOPHAGEAL ECHOCARDIOGRAM (TEE);  Surgeon: Sueanne Margarita, MD;  Location: [facility] ENDOSCOPY;  Service:  Cardiovascular;  Laterality: N/A;       Family History  Problem Relation Age of Onset  . Breast cancer Mother   . Breast cancer Maternal Grandmother     Social History   Tobacco Use  . Smoking status: Former Smoker    Packs/day: 0.50    Years: 58.00    Pack years: 29.00    Types: Cigarettes    Start date: 11/30/2013  . Smokeless tobacco: Never Used  Vaping Use  . Vaping Use: Never used  Substance Use Topics  . Alcohol use: No    Comment: none since 1985  . Drug use: No    Home Medications Prior to Admission medications   Medication Sig Start Date End Date Taking? Authorizing Provider  aspirin 325 MG tablet Take 1 tablet (325 mg total) by mouth daily. New blood thinner to prevent strokes 12/10/13   Love, Ivan Anchors, PA-C  atorvastatin (LIPITOR) 80 MG tablet Take 1 tablet (80 mg total) by mouth daily. Must keep appt w/new provider for future refills 06/23/17   Biagio Borg, MD  carvedilol (COREG) 6.25 MG tablet Take 1 tablet (6.25 mg total) by mouth 2 (two) times daily with a meal. 11/21/16   Golden Circle, FNP  ramipril (ALTACE) 2.5 MG capsule TAKE 1 CAPSULE BY MOUTH  DAILY ( MUST KEEP APPOINTMENT WITH NEW PROVIDER FOR FUTURE REFILLS) 08/21/17   Biagio Borg,  MD  senna-docusate (SENOKOT-S) 8.6-50 MG per tablet Take 1 tablet by mouth at bedtime. For constipation 03/01/14   Philmore Pali, NP  sitaGLIPtin (JANUVIA) 100 MG tablet Take 1 tablet (100 mg total) by mouth daily. Must keep appt w/new provider for future refills 06/23/17   Biagio Borg, MD    Allergies    Patient has no known allergies.  Review of Systems   Review of Systems  All other systems reviewed and are negative.   Physical Exam Updated Vital Signs BP 137/77 (BP Location: Left Arm)   Pulse 86   Temp 98.1 F (36.7 C) (Oral)   Resp 16   Wt 120.6 kg   SpO2 97%   BMI 32.36 kg/m   Physical Exam Vitals and nursing note reviewed.  Constitutional:      General: He is not in acute distress.     Appearance: He is well-developed and normal weight. He is not ill-appearing.     Comments: unkempt  HENT:     Head: Normocephalic and atraumatic.     Mouth/Throat:     Mouth: Mucous membranes are moist.  Eyes:     Pupils: Pupils are equal, round, and reactive to light.  Cardiovascular:     Rate and Rhythm: Normal rate and regular rhythm.  Pulmonary:     Effort: Pulmonary effort is normal.     Breath sounds: Normal breath sounds.  Chest:     Chest wall: No mass or deformity.  Abdominal:     General: Bowel sounds are normal.     Palpations: Abdomen is soft.  Genitourinary:    Comments: Rectum clear, no stool Musculoskeletal:        General: Normal range of motion.     Cervical back: Normal range of motion.     Right lower leg: No tenderness. No edema.     Left lower leg: No tenderness. No edema.  Skin:    General: Skin is warm and dry.     Capillary Refill: Capillary refill takes less than 2 seconds.  Neurological:     General: No focal deficit present.     Mental Status: He is alert.  Psychiatric:        Mood and Affect: Mood normal.     ED Results / Procedures / Treatments   Labs (all labs ordered are listed, but only abnormal results are displayed) Labs Reviewed  BASIC METABOLIC PANEL - Abnormal; Notable for the following components:      Result Value   Sodium 134 (*)    Potassium 3.0 (*)    CO2 18 (*)    Glucose, Bld 149 (*)    Calcium 8.7 (*)    All other components within normal limits  CBC - Abnormal; Notable for the following components:   WBC 12.5 (*)    Hemoglobin 9.4 (*)    HCT 32.4 (*)    MCV 75.3 (*)    MCH 21.9 (*)    MCHC 29.0 (*)    RDW 18.6 (*)    Platelets 470 (*)    All other components within normal limits  SARS CORONAVIRUS 2 BY RT PCR (HOSPITAL ORDER, [place] LAB)  TROPONIN I (HIGH SENSITIVITY)  TROPONIN I (HIGH SENSITIVITY)    EKG EKG Interpretation  Date/Time:  Saturday March 04 2020 08:48:30  EDT Ventricular Rate:  120 PR Interval:  140 QRS Duration: 104 QT Interval:  344 QTC Calculation: 486 R Axis:   -33 Text  Interpretation: Sinus tachycardia Left axis deviation Left ventricular hypertrophy with repolarization abnormality ( R in aVL , Cornell product ) Abnormal ECG diffuse nsst depression Confirmed by Pattricia Boss [phone]) on 03/04/2020 12:51:43 PM   Radiology DG Chest 2 View  Result Date: 03/04/2020 CLINICAL DATA:  Dyspnea EXAM: CHEST - 2 VIEW COMPARISON:  11/27/2013 chest radiograph. FINDINGS: Loop recorder overlies the medial left lower chest. Stable cardiomediastinal silhouette with normal heart size. No pneumothorax. No pleural effusion. New masslike opacity in the right infrahilar lung. No pulmonary edema. IMPRESSION: New masslike opacity in the right infrahilar lung, cannot exclude a lung mass. Recommend further evaluation with chest CT with IV contrast. Electronically Signed   By: Ilona Sorrel M.D.   On: 03/04/2020 09:35    Procedures Procedures (including critical care time)  Medications Ordered in ED Medications  sodium chloride flush (NS) 0.9 % injection 3 mL (has no administration in time range)    ED Course  I have reviewed the triage vital signs and the nursing notes.  Pertinent labs & imaging results that were available during my care of the patient were reviewed by me and considered in my medical decision making (see chart for details). Patient presents with a month of dyspnea.  Plain x-Sabel Hornbeck with likely new mass.  Labs with mild leukocytosis, anemia, hyponatremia, and hypokalemia. Plan cta chest to further evaluate mass vs pe vs infection. Will check covid IV fluids ordered and repeat troponin.  1-large mass right hilar mass- with possible postobstructive pneumonia.  Will begin antibiotics.  Discussed with Richardson Landry Minor, NP on for critical care and will see in consult Plan admission to medicine for ongoing evaluation and treatment 2- left arm pain- no focal  direct etiology noted by history or exam.  ? Cervical radiculopathy, no neuro deficit noted. 3- anemia- last hgb 12, 4.26.2018  today 9.4.  Rectal done but no stool obtained. 4- hypokalemia- will replete 5- suspect some volume depletion.  Patient received one liter ns, no active vomiting.  Discussed with Dr. Doristine Bosworth and she will see for admission   MDM Rules/Calculators/A&P                         Final Clinical Impression(s) / ED Diagnoses Final diagnoses:  Mass of lung  Left arm pain  Post-obstructive pneumonia due to foreign body aspiration  Anemia, unspecified type    Rx / DC Orders ED Discharge Orders    None       Pattricia Boss, MD 03/04/20 1515

## 2020-03-04 NOTE — ED Triage Notes (Addendum)
Pt in w/sob, L arm pain, and intermittent L rib pain x 1 mo. States pain is no worse today, but he just needs to get checked out. C/o constipation x 1 wk. HR 120 in triage, denies any chest pain, but has the intermittent L rib pain @ times. States he's also been out of meds x 3 yrs. sats 99% on RA, temp 98.4

## 2020-03-04 NOTE — H&P (Signed)
History and Physical    ASBURY HAIR IOE:[id number] DOB: 09-23-1940 DOA: 03/04/2020  PCP: Golden Circle, FNP  Patient coming from: home I have personally briefly reviewed patient's old medical records in [place]  Chief Complaint: Cough and worsening shortness of breath since 1 month  HPI: Scott Conley is a 79 y.o. male with medical history significant of hypertension, diabetes, hyperlipidemia, stroke with residual left-sided weakness, former smoker, former alcohol abuse presents to emergency department with productive cough with white phlegm, shortness of breath, left arm pain and back pain since 1 month.  Patient tells me that his symptoms started 1 month ago and has been getting worse.  He has noticed some streaks of blood in his sputum sometimes.  Reports that he was not feeling well this morning therefore he came to the ER for further evaluation and management.  He tells me that he has decreased appetite since 1 week, had not had bowel movement since Monday and has unintentional weight loss lately (Not sure how much he has lost)  He denies chest pain, palpitation, leg swelling, fever, chills, nausea, vomiting, headache, blurry vision, lightheadedness, dizziness, abdominal pain, melena, urinary or sleep changes.  Patient tells me that he has history of hypertension and diabetes however he has not been taking his medication since last 3 months.  He tells me that this is mistake that he stopped taking his medicines.  He denies any depressed mood.  He lives alone at home.  Uses walker for ambulation.  Quit smoking in 2015, does not drink alcohol, does not use any illicit drugs.  ED Course: Upon arrival to ED: His vital signs stable.  Afebrile, not requiring oxygen CMP shows sodium of 134, potassium of 3.0, WBCs: 12.5, CBC shows H&H of 9.4/32.4, MCV of 75.3, platelet: 470, troponin x2 -, UA and COVID-19: Pending Chest x-ray new masslike opacity in the right infrahilar lung,  cannot exclude lung mass.  Recommend further evaluation with chest CT with IV contrast.  CT angio of chest came back negative for PE.  Shows 8.7 x 6.6 cm right hilar mass and right lower lobe pulmonary artery branches and effaces same.  Postobstructive consolidation/Dron the lung involving the right lower lobe.  Patient received IV fluid, oxycodone, p.o. potassium replacement, IV Rocephin and azithromycin in ED.  EDP consulted PCCM.  [facility] hospitalist consulted for admission.  review of Systems: As per HPI otherwise negative.    Past Medical History:  Diagnosis Date  . Arthritis   . Blood clot in vein   . CVA (cerebral infarction)   . Diabetes ([place])   . Hypertension   . Stroke [facility])    Left hand stiffness     Past Surgical History:  Procedure Laterality Date  . CYST REMOVAL LEG     removed from right groin  . LOOP RECORDER IMPLANT  11/29/13   MDT LinQ implanted by Dr Caryl Comes for cryptogenic stroke  . LOOP RECORDER IMPLANT N/A 11/29/2013   Procedure: LOOP RECORDER IMPLANT;  Surgeon: Deboraha Sprang, MD;  Location: [facility] CATH LAB;  Service: Cardiovascular;  Laterality: N/A;  . TEE WITHOUT CARDIOVERSION N/A 11/29/2013   Procedure: TRANSESOPHAGEAL ECHOCARDIOGRAM (TEE);  Surgeon: Sueanne Margarita, MD;  Location: [facility] ENDOSCOPY;  Service: Cardiovascular;  Laterality: N/A;     reports that he has quit smoking. His smoking use included cigarettes. He started smoking about 6 years ago. He has a 29.00 pack-year smoking history. He has never used smokeless tobacco. He reports that  he does not drink alcohol and does not use drugs.  Not on File  Family History  Problem Relation Age of Onset  . Breast cancer Mother   . Breast cancer Maternal Grandmother     Prior to Admission medications   Medication Sig Start Date End Date Taking? Authorizing Provider  aspirin 325 MG tablet Take 1 tablet (325 mg total) by mouth daily. New blood thinner to prevent strokes 12/10/13  Yes Love, Ivan Anchors, PA-C  docusate sodium  (COLACE) 100 MG capsule Take 200 mg by mouth 2 (two) times daily.   Yes [provider]  atorvastatin (LIPITOR) 80 MG tablet Take 1 tablet (80 mg total) by mouth daily. Must keep appt w/new provider for future refills Patient not taking: Reported on 03/04/2020 06/23/17   Biagio Borg, MD  carvedilol (COREG) 6.25 MG tablet Take 1 tablet (6.25 mg total) by mouth 2 (two) times daily with a meal. Patient not taking: Reported on 03/04/2020 11/21/16   Golden Circle, FNP  ramipril (ALTACE) 2.5 MG capsule TAKE 1 CAPSULE BY MOUTH  DAILY ( MUST KEEP APPOINTMENT WITH NEW PROVIDER FOR FUTURE REFILLS) Patient not taking: Reported on 03/04/2020 08/21/17   Biagio Borg, MD  senna-docusate (SENOKOT-S) 8.6-50 MG per tablet Take 1 tablet by mouth at bedtime. For constipation Patient not taking: Reported on 03/04/2020 03/01/14   Philmore Pali, NP  sitaGLIPtin (JANUVIA) 100 MG tablet Take 1 tablet (100 mg total) by mouth daily. Must keep appt w/new provider for future refills Patient not taking: Reported on 03/04/2020 06/23/17   Biagio Borg, MD    Physical Exam: Vitals:   03/04/20 0851 03/04/20 1049 03/04/20 1153  BP: 117/84 134/87 137/77  Pulse: (!) 120 100 86  Resp: 18 17 16   Temp: 98.1 F (36.7 C)    TempSrc: Oral    SpO2: 99% 98% 97%  Weight: 120.6 kg      Constitutional: NAD, calm, comfortable, on room air, communicating well Eyes: PERRL, lids and conjunctivae normal ENMT: Mucous membranes are moist. Posterior pharynx clear of any exudate or lesions.Normal dentition.  Neck: normal, supple, no masses, no thyromegaly Respiratory: clear to auscultation bilaterally, no wheezing, no crackles. Normal respiratory effort. No accessory muscle use.  Cardiovascular: Regular rate and rhythm, no murmurs / rubs / gallops. No extremity edema. 2+ pedal pulses. No carotid bruits.  Abdomen: no tenderness, no masses palpated. No hepatosplenomegaly. Bowel sounds positive.  Musculoskeletal: no clubbing / cyanosis. No  joint deformity upper and lower extremities. Good ROM, no contractures. Normal muscle tone.  Skin: no rashes, lesions, ulcers. No induration Neurologic: CN 2-12 grossly intact. Sensation intact, DTR normal.  Strength 4 out of 5 in left upper and lower extremity, 5 out of 5 in right upper and lower extremity.  Psychiatric: Normal judgment and insight. Alert and oriented x 3. Normal mood.    Labs on Admission: I have personally reviewed following labs and imaging studies  CBC: Recent Labs  Lab 03/04/20 0857  WBC 12.5*  HGB 9.4*  HCT 32.4*  MCV 75.3*  PLT 973*   Basic Metabolic Panel: Recent Labs  Lab 03/04/20 0857  NA 134*  K 3.0*  CL 101  CO2 18*  GLUCOSE 149*  BUN 11  CREATININE 0.99  CALCIUM 8.7*   GFR: CrCl cannot be calculated (Unknown ideal weight.). Liver Function Tests: No results for input(s): AST, ALT, ALKPHOS, BILITOT, PROT, ALBUMIN in the last 168 hours. No results for input(s): LIPASE, AMYLASE in the  last 168 hours. No results for input(s): AMMONIA in the last 168 hours. Coagulation Profile: No results for input(s): INR, PROTIME in the last 168 hours. Cardiac Enzymes: No results for input(s): CKTOTAL, CKMB, CKMBINDEX, TROPONINI in the last 168 hours. BNP (last 3 results) No results for input(s): PROBNP in the last 8760 hours. HbA1C: No results for input(s): HGBA1C in the last 72 hours. CBG: No results for input(s): GLUCAP in the last 168 hours. Lipid Profile: No results for input(s): CHOL, HDL, LDLCALC, TRIG, CHOLHDL, LDLDIRECT in the last 72 hours. Thyroid Function Tests: No results for input(s): TSH, T4TOTAL, FREET4, T3FREE, THYROIDAB in the last 72 hours. Anemia Panel: No results for input(s): VITAMINB12, FOLATE, FERRITIN, TIBC, IRON, RETICCTPCT in the last 72 hours. Urine analysis:    Component Value Date/Time   COLORURINE YELLOW 11/26/2013 [place] 11/26/2013 1757   LABSPEC 1.019 11/26/2013 1757   PHURINE 6.0 11/26/2013 1757    GLUCOSEU NEGATIVE 11/26/2013 1757   HGBUR NEGATIVE 11/26/2013 1757   BILIRUBINUR NEGATIVE 11/26/2013 1757   KETONESUR NEGATIVE 11/26/2013 1757   PROTEINUR NEGATIVE 11/26/2013 1757   UROBILINOGEN 1.0 11/26/2013 1757   NITRITE NEGATIVE 11/26/2013 1757   LEUKOCYTESUR TRACE (A) 11/26/2013 1757    Radiological Exams on Admission: DG Chest 2 View  Result Date: 03/04/2020 CLINICAL DATA:  Dyspnea EXAM: CHEST - 2 VIEW COMPARISON:  11/27/2013 chest radiograph. FINDINGS: Loop recorder overlies the medial left lower chest. Stable cardiomediastinal silhouette with normal heart size. No pneumothorax. No pleural effusion. New masslike opacity in the right infrahilar lung. No pulmonary edema. IMPRESSION: New masslike opacity in the right infrahilar lung, cannot exclude a lung mass. Recommend further evaluation with chest CT with IV contrast. Electronically Signed   By: Ilona Sorrel M.D.   On: 03/04/2020 09:35   CT Angio Chest PE W and/or Wo Contrast  Result Date: 03/04/2020 CLINICAL DATA:  Chest pain, shortness of breath. Concern for malignancy. History of DVT. EXAM: CT ANGIOGRAPHY CHEST WITH CONTRAST TECHNIQUE: Multidetector CT imaging of the chest was performed using the standard protocol during bolus administration of intravenous contrast. Multiplanar CT image reconstructions and MIPs were obtained to evaluate the vascular anatomy. CONTRAST:  61 ml OMNIPAQUE IOHEXOL 350 MG/ML SOLN COMPARISON:  Chest x-ray on 03/04/2020 FINDINGS: Cardiovascular: Heart size is normal. Coronary artery calcifications are present. There is atherosclerotic calcification of the thoracic aorta not associated with aneurysm. No pericardial effusion. The pulmonary arteries are well opacified by contrast bolus. There is no acute pulmonary embolus. RIGHT hilar mass encases and effaces the RIGHT LOWER lobe pulmonary artery branches. The RIGHT middle lobe pulmonary artery branch is truncated, best seen on image 159 of series 6. Effaced RIGHT  LOWER lobe pulmonary veins. Mediastinum/Nodes: No significant mediastinal or LEFT hilar adenopathy. Confluent opacity in the RIGHT final precludes evaluation of individual lymph nodes. Axillary regions are unremarkable. The visualized portion of the thyroid gland has a normal appearance. Hiatal hernia. Otherwise the esophagus is normal. Lungs/Pleura: Centrilobular and paraseptal emphysematous changes are identified primarily within the UPPER lobes. There is a low-attenuation mass in the RIGHT hilar region measuring 8.7 x 6.6 x 6.0 centimeters. Mass is contiguous mass encircles the RIGHT LOWER lobe pulmonary artery branches and effaces them. There is complete occlusion of the bronchus intermedius by soft tissue mass. There is consolidations/drowned lung involving the RIGHT LOWER lobe. Upper Abdomen: Adrenal glands are normal. Gallbladder is present. The appearance of the liver is normal accounting for contrast bolus timing. Musculoskeletal: Mild midthoracic degenerative  changes. No suspicious lytic or blastic lesions are identified. Review of the MIP images confirms the above findings. IMPRESSION: 1. No acute pulmonary embolus. 2. 8.7 x 6.6 centimeter RIGHT hilar mass encases the RIGHT LOWER lobe pulmonary artery branches and effaces them. 3. Occlusion of the RIGHT middle lobe pulmonary artery branch. 4. Occlusion of the bronchus intermedius by the mass. 5. Postobstructive consolidation/drowned lung involving the RIGHT LOWER lobe. 6. Hiatal hernia. 7. Aortic Atherosclerosis (ICD10-I70.0) and Emphysema (ICD10-J43.9). These results were called by telephone at the time of interpretation on 03/04/2020 at 2:42 pm to provider Dr. Pattricia Boss, who verbally acknowledged these results. Electronically Signed   By: Nolon Nations M.D.   On: 03/04/2020 14:43    EKG: Independently reviewed.  Sinus tachycardia, left axis deviation, no ST elevation or depression noted.  Assessment/Plan Principal Problem:   Hilar  mass Active Problems:   Diabetes (HCC)   Hypertension   HLD (hyperlipidemia)   Hypokalemia   Hyponatremia   Microcytic anemia    Right hilar mass: -Likely secondary to bronchogenic carcinoma?  Patient presented with hemoptysis, shortness of breath and unintentional weight loss.  He is a former smoker and quit smoking in 2015. -Reviewed chest x-ray and CT angio of chest.  He is afebrile with leukocytosis of 12.5.  Troponin x2 -.  COVID-19 is pending. -Admit patient on the floor.  On continuous pulse ox.  He is on room air -Received IV fluid, Rocephin and azithromycin in ED. -EDP consulted PCCM recommended n.p.o. after midnight, bronc tomorrow 9 AM -We will continue Rocephin and azithromycin and Solu-Medrol. -Scheduled nebs, Pulmicort, Garlon Hatchet, Yupelri -Check procalcitonin level. -Monitor vitals closely.  Hypertension: Blood pressure is stable -Noncompliant with his home medication-Coreg and ramipril. -Resume home meds gradually.  Resume Coreg. -Discussed with the patient regarding medication compliance and he verbalized understanding -Monitor blood pressure closely.  Microcytic anemia: -Denies over-the-counter NSAID use, melena. -Never had colonoscopy.  Check iron studies.  Monitor H&H closely. -Transfuse as needed.    Hypokalemia: Replenished in ED.  Check magnesium level.  Repeat BMP tomorrow a.m.  Thrombocytosis: Platelet: 470 -Likely reactive. -Repeat CBC tomorrow a.m.  Type 2 diabetes mellitus: Check A1c -On Januvia at home.  Hold for now.  Started patient on sliding scale insulin and monitor blood sugar closely  CVA with left-sided residual weakness: Continue aspirin, resume atorvastatin  Chronic constipation: Start on MiraLAX and Colace  Unintentional weight loss: Likely secondary to underlying lung malignancy  DVT prophylaxis: Lovenox/SCD Code Status: Full code Family Communication: None present at bedside.  Plan of care discussed with patient in length and he  verbalized understanding and agreed with it. Disposition Plan: home after biopsy?  Consults called: PCCM Admission status: INPATIENT   Mckinley Jewel MD [facility]  If 7PM-7AM, please contact night-coverage [email or web address] Password [facility]  03/04/2020, 3:18 PM

## 2020-03-05 ENCOUNTER — Encounter (HOSPITAL_COMMUNITY)
Admission: EM | Disposition: A | Discharge status: Home / Self Care | Payer: Self-pay | Source: Home / Self Care | Attending: Internal Medicine

## 2020-03-05 ENCOUNTER — Inpatient Hospital Stay (HOSPITAL_COMMUNITY): Payer: Medicare HMO | Admitting: Certified Registered"

## 2020-03-05 ENCOUNTER — Other Ambulatory Visit: Payer: Self-pay

## 2020-03-05 ENCOUNTER — Encounter (HOSPITAL_COMMUNITY): Payer: Self-pay | Admitting: Internal Medicine

## 2020-03-05 DIAGNOSIS — C3491 Malignant neoplasm of unspecified part of right bronchus or lung: Principal | ICD-10-CM

## 2020-03-05 DIAGNOSIS — C3431 Malignant neoplasm of lower lobe, right bronchus or lung: Secondary | ICD-10-CM

## 2020-03-05 HISTORY — PX: BIOPSY: SHX5522

## 2020-03-05 HISTORY — PX: HEMOSTASIS CONTROL: SHX6838

## 2020-03-05 HISTORY — PX: CRYOTHERAPY: SHX6894

## 2020-03-05 HISTORY — PX: VIDEO BRONCHOSCOPY WITH ENDOBRONCHIAL ULTRASOUND: SHX6177

## 2020-03-05 LAB — APTT: aPTT: 42 seconds — ABNORMAL HIGH (ref 24–36)

## 2020-03-05 LAB — COMPREHENSIVE METABOLIC PANEL
ALT: 12 U/L (ref 0–44)
AST: 14 U/L — ABNORMAL LOW (ref 15–41)
Albumin: 2.1 g/dL — ABNORMAL LOW (ref 3.5–5.0)
Alkaline Phosphatase: 51 U/L (ref 38–126)
Anion gap: 11 (ref 5–15)
BUN: 12 mg/dL (ref 8–23)
CO2: 23 mmol/L (ref 22–32)
Calcium: 8.5 mg/dL — ABNORMAL LOW (ref 8.9–10.3)
Chloride: 104 mmol/L (ref 98–111)
Creatinine, Ser: 0.81 mg/dL (ref 0.61–1.24)
GFR calc Af Amer: >60 mL/min (ref >60)
GFR calc non Af Amer: >60 mL/min (ref >60)
Glucose, Bld: 137 mg/dL — ABNORMAL HIGH (ref 70–99)
Potassium: 4.9 mmol/L (ref 3.5–5.1)
Sodium: 138 mmol/L (ref 135–145)
Total Bilirubin: 0.7 mg/dL (ref 0.3–1.2)
Total Protein: 6.8 g/dL (ref 6.5–8.1)

## 2020-03-05 LAB — CBC
HCT: 29 % — ABNORMAL LOW (ref 39.0–52.0)
Hemoglobin: 8.5 g/dL — ABNORMAL LOW (ref 13.0–17.0)
MCH: 22 pg — ABNORMAL LOW (ref 26.0–34.0)
MCHC: 29.3 g/dL — ABNORMAL LOW (ref 30.0–36.0)
MCV: 75.1 fL — ABNORMAL LOW (ref 80.0–100.0)
Platelets: 368 10*3/uL (ref 150–400)
RBC: 3.86 MIL/uL — ABNORMAL LOW (ref 4.22–5.81)
RDW: 18.6 % — ABNORMAL HIGH (ref 11.5–15.5)
WBC: 9.6 10*3/uL (ref 4.0–10.5)
nRBC: 0 % (ref 0.0–0.2)

## 2020-03-05 LAB — URINALYSIS, ROUTINE W REFLEX MICROSCOPIC
Bilirubin Urine: NEGATIVE
Glucose, UA: NEGATIVE mg/dL
Hgb urine dipstick: NEGATIVE
Ketones, ur: 20 mg/dL — AB
Leukocytes,Ua: NEGATIVE
Nitrite: POSITIVE — AB
Protein, ur: 30 mg/dL — AB
Specific Gravity, Urine: >1.046 — ABNORMAL HIGH (ref 1.005–1.030)
pH: 5 (ref 5.0–8.0)

## 2020-03-05 LAB — GLUCOSE, CAPILLARY
Glucose-Capillary: 131 mg/dL — ABNORMAL HIGH (ref 70–99)
Glucose-Capillary: 105 mg/dL — ABNORMAL HIGH (ref 70–99)
Glucose-Capillary: 112 mg/dL — ABNORMAL HIGH (ref 70–99)

## 2020-03-05 LAB — PROTIME-INR
INR: 1.4 — ABNORMAL HIGH (ref 0.8–1.2)
Prothrombin Time: 16.4 seconds — ABNORMAL HIGH (ref 11.4–15.2)

## 2020-03-05 SURGERY — BRONCHOSCOPY, WITH EBUS
Anesthesia: General

## 2020-03-05 MED ORDER — OXYCODONE HCL 5 MG PO TABS: 5.0000 mg | ORAL_TABLET | Freq: Once | ORAL | Status: DC | PRN

## 2020-03-05 MED ORDER — SENNA 8.6 MG PO TABS
2.0000 | ORAL_TABLET | Freq: Every day | ORAL | 0 refills | Status: DC | PRN
Start: 2020-03-05 — End: 2020-04-20

## 2020-03-05 MED ORDER — ASPIRIN EC 81 MG PO TBEC
81.0000 mg | DELAYED_RELEASE_TABLET | Freq: Every day | ORAL | 0 refills | Status: AC
Start: 2020-03-06 — End: 2021-03-06

## 2020-03-05 MED ORDER — SODIUM CHLORIDE (PF) 0.9 % IJ SOLN
PREFILLED_SYRINGE | INTRAMUSCULAR | Status: DC | PRN
  Administered 2020-03-05: 3 mL
  Administered 2020-03-05 (×3): 2 mL

## 2020-03-05 MED ORDER — ONDANSETRON HCL 4 MG/2ML IJ SOLN
INTRAMUSCULAR | Status: DC | PRN
  Administered 2020-03-05: 4 mg via INTRAVENOUS

## 2020-03-05 MED ORDER — ACETAMINOPHEN 500 MG PO TABS
1000.0000 mg | ORAL_TABLET | Freq: Once | ORAL | Status: DC | PRN
Start: 2020-03-05 — End: 2020-03-05

## 2020-03-05 MED ORDER — DEXAMETHASONE SODIUM PHOSPHATE 10 MG/ML IJ SOLN
INTRAMUSCULAR | Status: DC | PRN
  Administered 2020-03-05: 4 mg via INTRAVENOUS

## 2020-03-05 MED ORDER — AZITHROMYCIN 500 MG PO TABS
500.0000 mg | ORAL_TABLET | Freq: Every day | ORAL | Status: DC
  Administered 2020-03-05: 500 mg via ORAL
  Filled 2020-03-05 (×2): qty 1

## 2020-03-05 MED ORDER — ACETAMINOPHEN 160 MG/5ML PO SOLN
1000.0000 mg | Freq: Once | ORAL | Status: DC | PRN
Start: 2020-03-05 — End: 2020-03-05

## 2020-03-05 MED ORDER — POTASSIUM CHLORIDE CRYS ER 20 MEQ PO TBCR
40.0000 meq | EXTENDED_RELEASE_TABLET | Freq: Two times a day (BID) | ORAL | Status: DC
  Administered 2020-03-05: 40 meq via ORAL
  Filled 2020-03-05: qty 2

## 2020-03-05 MED ORDER — EPINEPHRINE 1 MG/10ML IJ SOSY
PREFILLED_SYRINGE | INTRAMUSCULAR | Status: AC
  Filled 2020-03-05: qty 10

## 2020-03-05 MED ORDER — SUGAMMADEX SODIUM 200 MG/2ML IV SOLN
INTRAVENOUS | Status: DC | PRN
  Administered 2020-03-05: 200 mg via INTRAVENOUS

## 2020-03-05 MED ORDER — AZITHROMYCIN 500 MG PO TABS
500.0000 mg | ORAL_TABLET | Freq: Every day | ORAL | 0 refills | Status: AC
Start: 2020-03-05 — End: 2020-03-10

## 2020-03-05 MED ORDER — PROPOFOL 10 MG/ML IV BOLUS
INTRAVENOUS | Status: DC | PRN
  Administered 2020-03-05: 80 mg via INTRAVENOUS
  Administered 2020-03-05: 30 mg via INTRAVENOUS

## 2020-03-05 MED ORDER — POLYETHYLENE GLYCOL 3350 17 G PO PACK: 17.0000 g | PACK | Freq: Every day | ORAL | 0 refills | Status: DC | PRN

## 2020-03-05 MED ORDER — OXYCODONE HCL 5 MG/5ML PO SOLN: 5.0000 mg | Freq: Once | ORAL | Status: DC | PRN

## 2020-03-05 MED ORDER — SORBITOL 70 % SOLN
960.0000 mL | TOPICAL_OIL | Freq: Once | ORAL | Status: AC
  Administered 2020-03-05: 960 mL via RECTAL
  Filled 2020-03-05 (×2): qty 473

## 2020-03-05 MED ORDER — CARVEDILOL 6.25 MG PO TABS: 6.2500 mg | ORAL_TABLET | Freq: Two times a day (BID) | ORAL | 0 refills | Status: AC

## 2020-03-05 MED ORDER — BUDESONIDE 0.25 MG/2ML IN SUSP: 0.2500 mg | Freq: Two times a day (BID) | RESPIRATORY_TRACT | 0 refills | Status: DC

## 2020-03-05 MED ORDER — BENZONATATE 100 MG PO CAPS
200.0000 mg | ORAL_CAPSULE | Freq: Three times a day (TID) | ORAL | Status: DC
  Administered 2020-03-05: 200 mg via ORAL
  Filled 2020-03-05: qty 2

## 2020-03-05 MED ORDER — LIDOCAINE 2% (20 MG/ML) 5 ML SYRINGE
INTRAMUSCULAR | Status: DC | PRN
  Administered 2020-03-05: 40 mg via INTRAVENOUS
  Administered 2020-03-05: 60 mg via INTRAVENOUS

## 2020-03-05 MED ORDER — FENTANYL CITRATE (PF) 250 MCG/5ML IJ SOLN
INTRAMUSCULAR | Status: AC
  Filled 2020-03-05: qty 5

## 2020-03-05 MED ORDER — PREDNISONE 10 MG PO TABS
40.0000 mg | ORAL_TABLET | Freq: Every day | ORAL | 0 refills | Status: AC
Start: 2020-03-05 — End: 2020-03-10

## 2020-03-05 MED ORDER — ROCURONIUM BROMIDE 10 MG/ML (PF) SYRINGE
PREFILLED_SYRINGE | INTRAVENOUS | Status: DC | PRN
  Administered 2020-03-05: 10 mg via INTRAVENOUS
  Administered 2020-03-05: 60 mg via INTRAVENOUS

## 2020-03-05 MED ORDER — HYDROCODONE-HOMATROPINE 5-1.5 MG/5ML PO SYRP: 5.0000 mL | ORAL_SOLUTION | ORAL | Status: DC | PRN

## 2020-03-05 MED ORDER — FENTANYL CITRATE (PF) 100 MCG/2ML IJ SOLN
INTRAMUSCULAR | Status: DC | PRN
  Administered 2020-03-05: 100 ug via INTRAVENOUS
  Administered 2020-03-05: 25 ug via INTRAVENOUS

## 2020-03-05 MED ORDER — ATORVASTATIN CALCIUM 80 MG PO TABS: 80.0000 mg | ORAL_TABLET | Freq: Every day | ORAL | 0 refills | Status: AC

## 2020-03-05 MED ORDER — ACETAMINOPHEN 10 MG/ML IV SOLN
1000.0000 mg | Freq: Once | INTRAVENOUS | Status: DC | PRN
Start: 2020-03-05 — End: 2020-03-05

## 2020-03-05 MED ORDER — BISACODYL 10 MG RE SUPP: 10.0000 mg | Freq: Every day | RECTAL | 0 refills | Status: DC | PRN

## 2020-03-05 MED ORDER — ARFORMOTEROL TARTRATE 15 MCG/2ML IN NEBU: 15.0000 ug | INHALATION_SOLUTION | Freq: Two times a day (BID) | RESPIRATORY_TRACT | 0 refills | Status: DC

## 2020-03-05 MED ORDER — FENTANYL CITRATE (PF) 100 MCG/2ML IJ SOLN: 25.0000 ug | INTRAMUSCULAR | Status: DC | PRN

## 2020-03-05 MED ORDER — LACTATED RINGERS IV SOLN
INTRAVENOUS | Status: AC | PRN
  Administered 2020-03-05: 10 mL/h via INTRAVENOUS

## 2020-03-05 SURGICAL SUPPLY — 31 items
BRUSH CYTOL CELLEBRITY 1.5X140 (MISCELLANEOUS) IMPLANT
CANISTER SUCT 3000ML PPV (MISCELLANEOUS) ×4 IMPLANT
CONT SPEC 4OZ CLIKSEAL STRL BL (MISCELLANEOUS) ×4 IMPLANT
COVER BACK TABLE 60X90IN (DRAPES) ×4 IMPLANT
COVER DOME SNAP 22 D (MISCELLANEOUS) ×4 IMPLANT
FORCEPS BIOP RJ4 1.8 (CUTTING FORCEPS) IMPLANT
GAUZE SPONGE 4X4 12PLY STRL (GAUZE/BANDAGES/DRESSINGS) ×4 IMPLANT
GLOVE BIO SURGEON STRL SZ7.5 (GLOVE) ×4 IMPLANT
GOWN STRL REUS W/ TWL LRG LVL3 (GOWN DISPOSABLE) ×2 IMPLANT
GOWN STRL REUS W/TWL LRG LVL3 (GOWN DISPOSABLE) ×4
KIT CLEAN ENDO COMPLIANCE (KITS) ×8 IMPLANT
KIT TURNOVER KIT B (KITS) ×4 IMPLANT
MARKER SKIN DUAL TIP RULER LAB (MISCELLANEOUS) ×4 IMPLANT
NDL EBUS SONO TIP PENTAX (NEEDLE) ×2 IMPLANT
NEEDLE EBUS SONO TIP PENTAX (NEEDLE) ×4 IMPLANT
NS IRRIG 1000ML POUR BTL (IV SOLUTION) ×4 IMPLANT
OIL SILICONE PENTAX (PARTS (SERVICE/REPAIRS)) ×4 IMPLANT
PAD ARMBOARD 7.5X6 YLW CONV (MISCELLANEOUS) ×8 IMPLANT
SOL ANTI FOG 6CC (MISCELLANEOUS) ×2 IMPLANT
SOLUTION ANTI FOG 6CC (MISCELLANEOUS) ×2
SYR 20CC LL (SYRINGE) ×8 IMPLANT
SYR 20ML ECCENTRIC (SYRINGE) ×8 IMPLANT
SYR 50ML SLIP (SYRINGE) IMPLANT
SYR 5ML LUER SLIP (SYRINGE) ×4 IMPLANT
TOWEL OR 17X24 6PK STRL BLUE (TOWEL DISPOSABLE) ×4 IMPLANT
TRAP SPECIMEN MUCOUS 40CC (MISCELLANEOUS) IMPLANT
TUBE CONNECTING 20'X1/4 (TUBING) ×2
TUBE CONNECTING 20X1/4 (TUBING) ×6 IMPLANT
UNDERPAD 30X30 (UNDERPADS AND DIAPERS) ×4 IMPLANT
VALVE DISPOSABLE (MISCELLANEOUS) ×4 IMPLANT
WATER STERILE IRR 1000ML POUR (IV SOLUTION) ×4 IMPLANT

## 2020-03-05 NOTE — Discharge Instructions (Signed)
Constipation, Adult Constipation is when a person:  Poops (has a bowel movement) fewer times in a week than normal.  Has a hard time pooping.  Has poop that is dry, hard, or bigger than normal. Follow these instructions at home: Eating and drinking   Eat foods that have a lot of fiber, such as: ? Fresh fruits and vegetables. ? Whole grains. ? Beans.  Eat less of foods that are high in fat, low in fiber, or overly processed, such as: ? [place] fries. ? Hamburgers. ? Cookies. ? Candy. ? Soda.  Drink enough fluid to keep your pee (urine) clear or pale yellow. General instructions  Exercise regularly or as told by your doctor.  Go to the restroom when you feel like you need to poop. Do not hold it in.  Take over-the-counter and prescription medicines only as told by your doctor. These include any fiber supplements.  Do pelvic floor retraining exercises, such as: ? Doing deep breathing while relaxing your lower belly (abdomen). ? Relaxing your pelvic floor while pooping.  Watch your condition for any changes.  Keep all follow-up visits as told by your doctor. This is important. Contact a doctor if:  You have pain that gets worse.  You have a fever.  You have not pooped for 4 days.  You throw up (vomit).  You are not hungry.  You lose weight.  You are bleeding from the anus.  You have thin, pencil-like poop (stool). Get help right away if:  You have a fever, and your symptoms suddenly get worse.  You leak poop or have blood in your poop.  Your belly feels hard or bigger than normal (is bloated).  You have very bad belly pain.  You feel dizzy or you faint. This information is not intended to replace advice given to you by your health care provider. Make sure you discuss any questions you have with your health care provider. Document Revised: 06/27/2017 Document Reviewed: 01/03/2016 Elsevier Patient Education  [place]. Flexible Bronchoscopy,  Care After This sheet gives you information about how to care for yourself after your test. Your doctor may also give you more specific instructions. If you have problems or questions, contact your doctor. Follow these instructions at home: Eating and drinking  Do not eat or drink anything (not even water) for 2 hours after your test, or until your numbing medicine (local anesthetic) wears off.  When your numbness is gone and your cough and gag reflexes have come back, you may: ? Eat only soft foods. ? Slowly drink liquids.  The day after the test, go back to your normal diet. Driving  Do not drive for 24 hours if you were given a medicine to help you relax (sedative).  Do not drive or use heavy machinery while taking prescription pain medicine. General instructions   Take over-the-counter and prescription medicines only as told by your doctor.  Return to your normal activities as told. Ask what activities are safe for you.  Do not use any products that have nicotine or tobacco in them. This includes cigarettes and e-cigarettes. If you need help quitting, ask your doctor.  Keep all follow-up visits as told by your doctor. This is important. It is very important if you had a tissue sample (biopsy) taken. Get help right away if:  You have shortness of breath that gets worse.  You get light-headed.  You feel like you are going to pass out (faint).  You have chest pain.  You cough up: ? More than a little blood. ? More blood than before. Summary  Do not eat or drink anything (not even water) for 2 hours after your test, or until your numbing medicine wears off.  Do not use cigarettes. Do not use e-cigarettes.  Get help right away if you have chest pain. This information is not intended to replace advice given to you by your health care provider. Make sure you discuss any questions you have with your health care provider. Document Revised: 06/27/2017 Document Reviewed:  08/02/2016 Elsevier Patient Education  2020 [employer].

## 2020-03-05 NOTE — Transfer of Care (Signed)
Immediate Anesthesia Transfer of Care Note  Patient: Scott Conley  Procedure(s) Performed: VIDEO BRONCHOSCOPY WITH ENDOBRONCHIAL ULTRASOUND (N/A ) CRYOTHERAPY BIOPSY HEMOSTASIS CONTROL  Patient Location: PACU  Anesthesia Type:General  Level of Consciousness: awake and oriented  Airway & Oxygen Therapy: Patient Spontanous Breathing and Patient connected to face mask oxygen  Post-op Assessment: Report given to RN  Post vital signs: Reviewed and stable  Last Vitals:  Vitals Value Taken Time  BP 142/76 03/05/20 1024  Temp    Pulse 68 03/05/20 1026  Resp 14 03/05/20 1026  SpO2 100 % 03/05/20 1026  Vitals shown include unvalidated device data.  Last Pain:  Vitals:   03/05/20 0844  TempSrc: Oral  PainSc: 0-No pain         Complications: No complications documented.

## 2020-03-05 NOTE — Anesthesia Preprocedure Evaluation (Addendum)
Anesthesia Evaluation  Patient identified by MRN, date of birth, ID band Patient awake    Reviewed: Allergy & Precautions, NPO status , Patient's Chart, lab work & pertinent test results  History of Anesthesia Complications Negative for: history of anesthetic complications  Airway Mallampati: I  TM Distance: >3 FB Neck ROM: Full    Dental  (+) Dental Advisory Given, Upper Dentures, Edentulous Upper   Pulmonary COPD, former smoker,  hemoptysis   breath sounds clear to auscultation       Cardiovascular hypertension, Pt. on medications  Rhythm:Regular  Left ventricle: Systolic function was normal. The  estimated ejection fraction was in the range of 55% to  60%. Wall motion was normal; there were no regional wall  motion abnormalities.  - Aortic valve: Trivial regurgitation.  - Left atrium: No evidence of thrombus in the atrial cavity  or appendage.  - Right ventricle: The cavity size was mildly dilated. Wall  thickness was normal.  - Right atrium: The atrium was mildly dilated. No evidence  of thrombus in the atrial cavity or appendage.  - Atrial septum: No defect or patent foramen ovale was  identified.  - Tricuspid valve: Trivial regurgitation.  - Pulmonic valve: Trivial regurgitation.  - Impressions: THe patient developed increased respiratory  secretions with blood tinged sputum and BP increased to  236/126mmHg and procedure terminated prior to contrast  study. An agitated saline contrast study was performed  with 2D echo in 3 different views. No evidence of  intracardiac shunt in the apical 4 chamber, parasternal  short axis and epigastric views with saline contrast.     Neuro/Psych Stroke 2015 with left sided weakness  Neuromuscular disease CVA, Residual Symptoms    GI/Hepatic negative GI ROS, Neg liver ROS,   Endo/Other  diabetes  Renal/GU negative Renal ROS      Musculoskeletal  (+) Arthritis ,   Abdominal   Peds  Hematology negative hematology ROS (+)   Anesthesia Other Findings   Reproductive/Obstetrics                            Anesthesia Physical Anesthesia Plan  ASA: III  Anesthesia Plan: General   Post-op Pain Management:    Induction: Intravenous  PONV Risk Score and Plan: 2 and Ondansetron and Dexamethasone  Airway Management Planned: Oral ETT  Additional Equipment: None  Intra-op Plan:   Post-operative Plan: Extubation in OR  Informed Consent: I have reviewed the patients History and Physical, chart, labs and discussed the procedure including the risks, benefits and alternatives for the proposed anesthesia with the patient or authorized representative who has indicated his/her understanding and acceptance.     Dental advisory given  Plan Discussed with: CRNA and Surgeon  Anesthesia Plan Comments:         Anesthesia Quick Evaluation

## 2020-03-05 NOTE — Discharge Summary (Addendum)
Discharge Summary  MAJID MCCRAVY OHY:[id number] DOB: 1940/09/27  PCP: Golden Circle, FNP  Admit date: 03/04/2020 Discharge date: 03/05/2020  Time spent: 35 minutes  Recommendations for Outpatient Follow-up:  1. Follow up with Pulmonary Dr. Valeta Harms 2. Pulmonary will arrange for your oncology appointment after results of your test from your bronchoscopy 3. Take your medications as prescribed 4. Follow up with your PCP or if you no longer have one, call on Monday for a post hospitalization appointment at the [facility]. 5. Follow up with PCP within a week for your constipation.  Discharge Diagnoses:  Active Hospital Problems   Diagnosis Date Noted  . Hilar mass   . Endobronchial cancer, right ([place])   . HLD (hyperlipidemia)   . Hypokalemia   . Hyponatremia   . Microcytic anemia   . Diabetes ([place]) 11/21/2015  . Hypertension 11/21/2015    Resolved Hospital Problems  No resolved problems to display.    Discharge Condition: Stable   Diet recommendation: Heart carb modified diet.   Vitals:   03/05/20 1045 03/05/20 1132  BP: 129/72 (!) 163/72  Pulse: 70   Resp: 14 18  Temp: 97.9 F (36.6 C) (!) 97.5 F (36.4 C)  SpO2: 97%     History of present illness:  This is an 79 year old male, history of CVA, type 2 diabetes, former smoker quit 2015, presents to the ER with complaints of shortness of breath.  He states that he has had congestion with the chest for the past month or more.  Pulmonary was consulted for recommendations management following CT scan of the chest which revealed large right-sided hilar mass.  Bronchoscopy completed on 03/05/20 by Dr. Valeta Harms.  Discussed with Dr. Valeta Harms, ok to discharge from a pulmonary standpoint.  Pulmonary will arrange for oncology appointment after results of bronchoscopy.  03/05/20:  Seen and examined.  Feels better after his breathing treatment.  Denies any chest pain, dyspnea at rest, or palpitations.  At  discharge, daughter reports he is constipated.  Positive bowel sounds on exam, no abdominal pain.  No reported nausea.  Afebrile, no leukocytosis.  Ate his meal without any difficulty.  Ordered laxative for home to take as needed. Literature for constipation provided.  Needs to follow up with his PCP within a week for his constipation.   Hospital Course:  Principal Problem:   Hilar mass Active Problems:   Diabetes ([place])   Hypertension   HLD (hyperlipidemia)   Hypokalemia   Hyponatremia   Microcytic anemia   Endobronchial cancer, right ([facility])  Right Hilar Mass Hemoptysis  Concerning for a primary bronchogenic carcinoma  Likely COPD, some chest tightness, possible AECOPD Former smoker Continue prednisone 40 mg x 5 days Continue po azithromycin x 5 days  Continue nebs O2 saturation 99% RA Follow up with Pulmonary, will arrange for oncology follow up.  H/o CVA  HTN DMII  Resume aspirin 81 mg daily, lipitor, coreg Follow up with PCP  History of medication non compliance Per his daughter has not taken his stroke medications for 2 years Resume meds  Resolved post repletion: Hypokalemia Serum K+ 4.9  Resolved Leukocytosis  - likely reactive  - No evidence of systemic infection or active infective process - Non toxic appearing, O2 sat 99% RA - no clear pna on ct  - On po azithromycin for its antiinflammatory properties    Procedures: Bronchoscopy on 03/05/20 by Dr. Valeta Harms:  Tumor debulking right bronchus intermedius Cryotherapy, endobronchial cryo biopsies, relief of  obstruction Hemostasis  Consultations:  Pulmonary  Discharge Exam: BP (!) 163/72 (BP Location: Right Arm)   Pulse 70   Temp (!) 97.5 F (36.4 C) (Oral)   Resp 18   Ht 6\' 4"  (1.93 m)   Wt 97.1 kg   SpO2 97%   BMI 26.05 kg/m  . General: 79 y.o. year-old male well developed well nourished in no acute distress.  Alert and oriented x3. . Cardiovascular: Regular rate and rhythm with no rubs or  gallops.  No thyromegaly or JVD noted.   Marland Kitchen Respiratory: Clear to auscultation with no wheezes or rales. Good inspiratory effort. . Abdomen: Soft nontender nondistended with normal bowel sounds x4 quadrants. . Musculoskeletal: No lower extremity edema bilaterally. Marland Kitchen Psychiatry: Mood is appropriate for condition and setting  Discharge Instructions You were cared for by a hospitalist during your hospital stay. If you have any questions about your discharge medications or the care you received while you were in the hospital after you are discharged, you can call the unit and asked to speak with the hospitalist on call if the hospitalist that took care of you is not available. Once you are discharged, your primary care physician will handle any further medical issues. Please note that NO REFILLS for any discharge medications will be authorized once you are discharged, as it is imperative that you return to your primary care physician (or establish a relationship with a primary care physician if you do not have one) for your aftercare needs so that they can reassess your need for medications and monitor your lab values.   Allergies as of 03/05/2020   No Known Allergies     Medication List    STOP taking these medications   aspirin 325 MG tablet Replaced by: aspirin EC 81 MG tablet   ramipril 2.5 MG capsule Commonly known as: ALTACE     TAKE these medications   arformoterol 15 MCG/2ML Nebu Commonly known as: BROVANA Take 2 mLs (15 mcg total) by nebulization 2 (two) times daily.   aspirin EC 81 MG tablet Take 1 tablet (81 mg total) by mouth daily. Swallow whole. Start taking on: March 06, 2020 Replaces: aspirin 325 MG tablet   atorvastatin 80 MG tablet Commonly known as: LIPITOR Take 1 tablet (80 mg total) by mouth daily. Must keep appt w/new provider for future refills   azithromycin 500 MG tablet Commonly known as: Zithromax Take 1 tablet (500 mg total) by mouth daily for 5 days. Take  1 tablet daily for 3 days.   bisacodyl 10 MG suppository Commonly known as: Dulcolax Place 1 suppository (10 mg total) rectally daily as needed for severe constipation.   budesonide 0.25 MG/2ML nebulizer solution Commonly known as: PULMICORT Take 2 mLs (0.25 mg total) by nebulization 2 (two) times daily.   carvedilol 6.25 MG tablet Commonly known as: Coreg Take 1 tablet (6.25 mg total) by mouth 2 (two) times daily with a meal.   docusate sodium 100 MG capsule Commonly known as: COLACE Take 200 mg by mouth 2 (two) times daily.   polyethylene glycol 17 g packet Commonly known as: MiraLax Take 17 g by mouth daily as needed for moderate constipation.   predniSONE 10 MG tablet Commonly known as: DELTASONE Take 4 tablets (40 mg total) by mouth daily with breakfast for 5 days.   senna 8.6 MG Tabs tablet Commonly known as: SENOKOT Take 2 tablets (17.2 mg total) by mouth daily as needed for moderate constipation.   senna-docusate 8.6-50  MG tablet Commonly known as: Senokot-S Take 1 tablet by mouth at bedtime. For constipation   sitaGLIPtin 100 MG tablet Commonly known as: Januvia Take 1 tablet (100 mg total) by mouth daily. Must keep appt w/new provider for future refills      No Known Allergies  Follow-up Information    Icard, Bradley L, DO In 4 weeks.   Specialty: Pulmonary Disease Contact information: [place] [address] [phone]        Golden Circle, FNP. Call in 1 day(s).   Specialties: Family Medicine, Infectious Diseases Why: Please call for a post hospital follow up appointment Contact information: [place] [address] [phone]        [facility]. Call in 1 day(s).   Why: Please call for a post hospital follow up appointment Contact information: [address] [place] [address] [phone]               The results of significant  diagnostics from this hospitalization (including imaging, microbiology, ancillary and laboratory) are listed below for reference.    Significant Diagnostic Studies: DG Chest 2 View  Result Date: 03/04/2020 CLINICAL DATA:  Dyspnea EXAM: CHEST - 2 VIEW COMPARISON:  11/27/2013 chest radiograph. FINDINGS: Loop recorder overlies the medial left lower chest. Stable cardiomediastinal silhouette with normal heart size. No pneumothorax. No pleural effusion. New masslike opacity in the right infrahilar lung. No pulmonary edema. IMPRESSION: New masslike opacity in the right infrahilar lung, cannot exclude a lung mass. Recommend further evaluation with chest CT with IV contrast. Electronically Signed   By: Ilona Sorrel M.D.   On: 03/04/2020 09:35   CT Angio Chest PE W and/or Wo Contrast  Result Date: 03/04/2020 CLINICAL DATA:  Chest pain, shortness of breath. Concern for malignancy. History of DVT. EXAM: CT ANGIOGRAPHY CHEST WITH CONTRAST TECHNIQUE: Multidetector CT imaging of the chest was performed using the standard protocol during bolus administration of intravenous contrast. Multiplanar CT image reconstructions and MIPs were obtained to evaluate the vascular anatomy. CONTRAST:  61 ml OMNIPAQUE IOHEXOL 350 MG/ML SOLN COMPARISON:  Chest x-ray on 03/04/2020 FINDINGS: Cardiovascular: Heart size is normal. Coronary artery calcifications are present. There is atherosclerotic calcification of the thoracic aorta not associated with aneurysm. No pericardial effusion. The pulmonary arteries are well opacified by contrast bolus. There is no acute pulmonary embolus. RIGHT hilar mass encases and effaces the RIGHT LOWER lobe pulmonary artery branches. The RIGHT middle lobe pulmonary artery branch is truncated, best seen on image 159 of series 6. Effaced RIGHT LOWER lobe pulmonary veins. Mediastinum/Nodes: No significant mediastinal or LEFT hilar adenopathy. Confluent opacity in the RIGHT final precludes evaluation of individual  lymph nodes. Axillary regions are unremarkable. The visualized portion of the thyroid gland has a normal appearance. Hiatal hernia. Otherwise the esophagus is normal. Lungs/Pleura: Centrilobular and paraseptal emphysematous changes are identified primarily within the UPPER lobes. There is a low-attenuation mass in the RIGHT hilar region measuring 8.7 x 6.6 x 6.0 centimeters. Mass is contiguous mass encircles the RIGHT LOWER lobe pulmonary artery branches and effaces them. There is complete occlusion of the bronchus intermedius by soft tissue mass. There is consolidations/drowned lung involving the RIGHT LOWER lobe. Upper Abdomen: Adrenal glands are normal. Gallbladder is present. The appearance of the liver is normal accounting for contrast bolus timing. Musculoskeletal: Mild midthoracic degenerative changes. No suspicious lytic or blastic lesions are identified. Review of the MIP images confirms  the above findings. IMPRESSION: 1. No acute pulmonary embolus. 2. 8.7 x 6.6 centimeter RIGHT hilar mass encases the RIGHT LOWER lobe pulmonary artery branches and effaces them. 3. Occlusion of the RIGHT middle lobe pulmonary artery branch. 4. Occlusion of the bronchus intermedius by the mass. 5. Postobstructive consolidation/drowned lung involving the RIGHT LOWER lobe. 6. Hiatal hernia. 7. Aortic Atherosclerosis (ICD10-I70.0) and Emphysema (ICD10-J43.9). These results were called by telephone at the time of interpretation on 03/04/2020 at 2:42 pm to provider Dr. Pattricia Boss, who verbally acknowledged these results. Electronically Signed   By: Nolon Nations M.D.   On: 03/04/2020 14:43    Microbiology: Recent Results (from the past 240 hour(s))  SARS Coronavirus 2 by RT PCR (hospital order, performed in [facility] hospital lab) Nasopharyngeal Nasopharyngeal Swab     Status: None   Collection Time: 03/04/20  1:48 PM   Specimen: Nasopharyngeal Swab  Result Value Ref Range Status   SARS Coronavirus 2 NEGATIVE  NEGATIVE Final    Comment: (NOTE) SARS-CoV-2 target nucleic acids are NOT DETECTED.  The SARS-CoV-2 RNA is generally detectable in upper and lower respiratory specimens during the acute phase of infection. The lowest concentration of SARS-CoV-2 viral copies this assay can detect is 250 copies / mL. A negative result does not preclude SARS-CoV-2 infection and should not be used as the sole basis for treatment or other patient management decisions.  A negative result may occur with improper specimen collection / handling, submission of specimen other than nasopharyngeal swab, presence of viral mutation(s) within the areas targeted by this assay, and inadequate number of viral copies (<250 copies / mL). A negative result must be combined with clinical observations, patient history, and epidemiological information.  Fact Sheet for Patients:   StrictlyIdeas.no  Fact Sheet for Healthcare Providers: BankingDealers.co.za  This test is not yet approved or  cleared by the [place] FDA and has been authorized for detection and/or diagnosis of SARS-CoV-2 by FDA under an Emergency Use Authorization (EUA).  This EUA will remain in effect (meaning this test can be used) for the duration of the COVID-19 declaration under Section 564(b)(1) of the Act, 21 U.S.C. section 360bbb-3(b)(1), unless the authorization is terminated or revoked sooner.  Performed at [facility], [place] [address], [place], [place] [address]      Labs: Basic Metabolic Panel: Recent Labs  Lab 03/04/20 0857 03/04/20 1333 03/05/20 0620  NA 134*  --  138  K 3.0*  --  4.9  CL 101  --  104  CO2 18*  --  23  GLUCOSE 149*  --  137*  BUN 11  --  12  CREATININE 0.99 1.01 0.81  CALCIUM 8.7*  --  8.5*  MG  --  1.9  --   PHOS  --  3.1  --    Liver Function Tests: Recent Labs  Lab 03/05/20 0620  AST 14*  ALT 12  ALKPHOS 51  BILITOT 0.7  PROT 6.8  ALBUMIN 2.1*    No results for input(s): LIPASE, AMYLASE in the last 168 hours. No results for input(s): AMMONIA in the last 168 hours. CBC: Recent Labs  Lab 03/04/20 0857 03/04/20 1649 03/05/20 0620  WBC 12.5* 9.6 9.6  HGB 9.4* 9.1* 8.5*  HCT 32.4* 31.3* 29.0*  MCV 75.3* 76.3* 75.1*  PLT 470* 358 368   Cardiac Enzymes: No results for input(s): CKTOTAL, CKMB, CKMBINDEX, TROPONINI in the last 168 hours. BNP: BNP (last 3 results) No results for input(s):  BNP in the last 8760 hours.  ProBNP (last 3 results) No results for input(s): PROBNP in the last 8760 hours.  CBG: Recent Labs  Lab 03/04/20 1722 03/04/20 2307 03/05/20 0626 03/05/20 1025 03/05/20 1130  GLUCAP 103* 142* 131* 105* 112*       Signed:  Kayleen Memos, MD [facility] 03/05/2020, 3:35 PM

## 2020-03-05 NOTE — Interval H&P Note (Signed)
History and Physical Interval Note:  03/05/2020 9:14 AM  Scott Conley  has presented today for surgery, with the diagnosis of lung mass.  The various methods of treatment have been discussed with the patient and family. After consideration of risks, benefits and other options for treatment, the patient has consented to  Procedure(s): [place] (N/A) as a surgical intervention.  The patient's history has been reviewed, patient examined, no change in status, stable for surgery.  I have reviewed the patient's chart and labs.  Questions were answered to the patient's satisfaction.    Discussed risks benefits and alternatives with the patient. Patient is agreeable to proceed.   [place]

## 2020-03-05 NOTE — Evaluation (Signed)
Physical Therapy Evaluation Patient Details Name: Scott Conley MRN: [id number] DOB: 11-06-1940 Today's Date: 03/05/2020   History of Present Illness  Patient is a 79 y/o male who presents with SOB, LUE pain rib pain and cough. CXR- new massive opacity right infrahilar lung likely secondary to bronchogenic carcinoma. s/p Tumor debulking right bronchus intermedius 8/8. PMH includes CVA with left residual weakness, HTN and DM.  Clinical Impression  Patient presents with generalized weakness and impaired mobility s/p above. Pt lives alone and has help from daughter as needed. Uses RW for ambulation PTA. Reports no falls. Today, pt tolerated bed mobility, transfers and gait training with Min guard-supervision for safety. Reports minimal household ambulation at baseline. Concerned that he has not had a BM for ~1 week, awaiting enema. Will follow acutely to maximize independence and mobility prior to return home.    Follow Up Recommendations No PT follow up;Supervision - Intermittent    Equipment Recommendations  None recommended by PT    Recommendations for Other Services       Precautions / Restrictions Precautions Precautions: None Restrictions Weight Bearing Restrictions: No      Mobility  Bed Mobility Overal bed mobility: Modified Independent             General bed mobility comments: No assist needed.  Transfers Overall transfer level: Needs assistance Equipment used: Rolling walker (2 wheeled) Transfers: Sit to/from Stand Sit to Stand: Supervision         General transfer comment: Supervision for safety.  Ambulation/Gait Ambulation/Gait assistance: Min guard Gait Distance (Feet): 70 Feet Assistive device: Rolling walker (2 wheeled) Gait Pattern/deviations: Step-through pattern;Decreased stride length;Trunk flexed Gait velocity: decreased   General Gait Details: Slow, mostly steady gait with flexed trunk and cues for RW proximity. Sp02 >94%, no DOE.  Stairs             Wheelchair Mobility    Modified Rankin (Stroke Patients Only)       Balance Overall balance assessment: Mild deficits observed, not formally tested                                           Pertinent Vitals/Pain Pain Assessment: No/denies pain    Home Living Family/patient expects to be discharged to:: Private residence Living Arrangements: Alone Available Help at Discharge: Family;Available PRN/intermittently Type of Home: Apartment Home Access: Level entry     Home Layout: One level Home Equipment: Walker - 2 wheels      Prior Function Level of Independence: Independent with assistive device(s)         Comments: Uses RW for ambulation. No falls. Drives. Daughter brings food and gets groceries delivered.     Hand Dominance   Dominant Hand: Right    Extremity/Trunk Assessment   Upper Extremity Assessment Upper Extremity Assessment: Defer to OT evaluation    Lower Extremity Assessment Lower Extremity Assessment: Generalized weakness (but functional)       Communication   Communication: No difficulties  Cognition Arousal/Alertness: Awake/alert Behavior During Therapy: WFL for tasks assessed/performed Overall Cognitive Status: No family/caregiver present to determine baseline cognitive functioning Area of Impairment: Orientation                 Orientation Level: Disoriented to;Situation             General Comments: "No one has told me anything" when asked why he is  here.      General Comments      Exercises     Assessment/Plan    PT Assessment Patient needs continued PT services  PT Problem List Decreased strength;Decreased mobility;Decreased balance;Decreased activity tolerance;Decreased cognition       PT Treatment Interventions Therapeutic activities;Gait training;Therapeutic exercise;Patient/family education;Balance training;Functional mobility training    PT Goals (Current goals can be  found in the Care Plan section)  Acute Rehab PT Goals Patient Stated Goal: to have a bowel movement and then go home PT Goal Formulation: With patient Time For Goal Achievement: 03/19/20 Potential to Achieve Goals: Good    Frequency Min 3X/week   Barriers to discharge Decreased caregiver support lives alone    Co-evaluation               AM-PAC PT "6 Clicks" Mobility  Outcome Measure Help needed turning from your back to your side while in a flat bed without using bedrails?: None Help needed moving from lying on your back to sitting on the side of a flat bed without using bedrails?: None Help needed moving to and from a bed to a chair (including a wheelchair)?: None Help needed standing up from a chair using your arms (e.g., wheelchair or bedside chair)?: None Help needed to walk in hospital room?: A Little Help needed climbing 3-5 steps with a railing? : A Little 6 Click Score: 22    End of Session Equipment Utilized During Treatment: Gait belt Activity Tolerance: Patient tolerated treatment well Patient left: in bed;with call bell/phone within reach;with bed alarm set Nurse Communication: Mobility status PT Visit Diagnosis: Difficulty in walking, not elsewhere classified (R26.2);Muscle weakness (generalized) (M62.81)    Time: 1350 [phone] PT Time Calculation (min) (ACUTE ONLY): 13 min   Charges:   PT Evaluation $PT Eval Moderate Complexity: 1 Mod          Marisa Severin, PT, DPT Acute Rehabilitation Services Pager [phone] Office [phone]      Marguarite Arbour A Amoreena Neubert 03/05/2020, 2:09 PM

## 2020-03-05 NOTE — Anesthesia Procedure Notes (Signed)
Procedure Name: Intubation Date/Time: 03/05/2020 9:20 AM Performed by: Barrington Ellison, CRNA Pre-anesthesia Checklist: Patient identified, Emergency Drugs available, Suction available and Patient being monitored Patient Re-evaluated:Patient Re-evaluated prior to induction Oxygen Delivery Method: Circle System Utilized Preoxygenation: Pre-oxygenation with 100% oxygen Induction Type: IV induction Ventilation: Mask ventilation without difficulty Laryngoscope Size: Mac Grade View: Grade I Tube type: Oral Tube size: 8.5 mm Number of attempts: 1 Airway Equipment and Method: Stylet and Oral airway Placement Confirmation: ETT inserted through vocal cords under direct vision,  positive ETCO2 and breath sounds checked- equal and bilateral Secured at: 22 cm Tube secured with: Tape Dental Injury: Teeth and Oropharynx as per pre-operative assessment

## 2020-03-05 NOTE — Progress Notes (Signed)
PT Cancellation Note  Patient Details Name: TEAGON KRON MRN: [id number] DOB: [birth year]   Cancelled Treatment:    Reason Eval/Treat Not Completed: Patient at procedure or test/unavailable Pt off floor. Will follow.   Marguarite Arbour A Sharonann Malbrough 03/05/2020, 10:51 AM Marisa Severin, PT, DPT Acute Rehabilitation Services Pager [phone] Office [phone]

## 2020-03-05 NOTE — Op Note (Signed)
Bronchoscopy Procedure Note Tumor debulking right bronchus intermedius Cryotherapy, endobronchial cryo biopsies, relief of obstruction Hemostasis  Scott Conley  [id number]  1941/04/06  Date:03/05/20  Time:10:13 AM   Provider Performing:Labrandon Knoch L Jeshurun [place]   Procedure(s):  Flexible Bronchoscopy (54650)  Indication(s) Right hilar mass, endobronchial tumor  Consent Risks of the procedure as well as the alternatives and risks of each were explained to the patient and/or caregiver.  Consent for the procedure was obtained and is signed in the bedside chart  Anesthesia General anesthesia per record  Time Out Verified patient identification, verified procedure, site/side was marked, verified correct patient position, special equipment/implants available, medications/allergies/relevant history reviewed, required imaging and test results available.  Sterile Technique Usual hand hygiene, masks, gowns, and gloves were used  Procedure Description Bronchoscope advanced through endotracheal tube and into airway.  Airways were examined down to subsegmental level with findings noted below.  There was normal right and left lung anatomy except for total occlusion of the right bronchus intermedius.  The right lower lobe subsegments and right middle lobe opening was not visualized.  There was mounds of white-tan and dark gray appearing necrotic tumor invading the airway.  We started the procedure by instilling 1: 20,000 dilution of epinephrine, 3 cc into the right mainstem.  2.8 mm [employer] forceps were used to excise and debride tumor within the bronchus intermedius.  Once we got down to the more fine tan grainy tumor bed we switched to the 1.7 mm erbe cryoprobe.  Using multiple freeze thaw cycles and en bloc retraction of bronchoscope and cryoprobe multiple endobronchial cryo biopsies were obtained for tumor debulking of the bronchus intermedius.  We were never really able to visualize the distal  segments of the right middle lobe.  I was able to pass the cryotherapy probe through the tumor to the opposite side.  Halfway through the procedure there was some oozing of blood that required irrigation with ice saline as well as three additional aliquots of epinephrine.  At the termination of the procedure there was still near total occlusion of the bronchus intermedius however we had significant tumor removed to be sent for pathology.  We left adherent clot to the opening of the bronchus intermedius.  The right upper lobe was patent as well as the left lung was clear of secretions and blood clots.  At the end of the procedure the bronchoscope was used for therapeutic aspiration of a large blood clot within the right upper lobe which was retracted en bloc.  The left lung was clear.  We waited approximately 5 minutes in the room with the patient ventilated for a repeat inspection of the airway to ensure no evidence of active bleeding.  The bronchoscope was returned to just above the main carina and there was no evidence of active bleeding and there was stable adherent clot within the opening of the bronchus intermedius.  Patient will be transferred from [facility] endoscopy room two to PACU for recovery.  Complications/Tolerance None; patient tolerated the procedure well. Chest X-ray is not needed post procedure.  EBL Minimal <5cc  Specimen(s) 1.  Endobronchial cryo biopsies 2.  Endobronchial forcep biopsies

## 2020-03-05 NOTE — Progress Notes (Signed)
No results from smog enema , pt states he is okay with going home without having a bowel movement.  MD aware

## 2020-03-07 ENCOUNTER — Encounter (HOSPITAL_COMMUNITY): Payer: Self-pay | Admitting: Pulmonary Disease

## 2020-03-08 ENCOUNTER — Encounter: Payer: Self-pay | Admitting: *Deleted

## 2020-03-08 ENCOUNTER — Telehealth: Payer: Self-pay | Admitting: Internal Medicine

## 2020-03-08 DIAGNOSIS — R918 Other nonspecific abnormal finding of lung field: Secondary | ICD-10-CM

## 2020-03-08 LAB — SURGICAL PATHOLOGY

## 2020-03-08 NOTE — Progress Notes (Signed)
I received referral on Mr. Westwood today.  I notified new patient scheduler to call and schedule with Dr. Julien Nordmann on 03/20/20 with labs

## 2020-03-08 NOTE — Telephone Encounter (Signed)
Received a new pt referral from Dr. Valeta Harms for new dx of lung cancer. Scott Conley has been cld and scheduled to see Dr. Julien Nordmann on 8/23 at 215pm w/labs at 145pm.

## 2020-03-09 ENCOUNTER — Telehealth: Payer: Self-pay | Admitting: Medical Oncology

## 2020-03-09 NOTE — Telephone Encounter (Signed)
Asking for path report. I directed her to Dr Valeta Harms .

## 2020-03-10 ENCOUNTER — Telehealth: Payer: Self-pay | Admitting: Pulmonary Disease

## 2020-03-10 NOTE — Telephone Encounter (Signed)
Patient's daughter calling for results of recent pathology after bronch. She is on the [facility]. Read the report and directed her to follow up with oncology at appointment on 8/23 with MD [facility].

## 2020-03-11 NOTE — Anesthesia Postprocedure Evaluation (Signed)
Anesthesia Post Note  Patient: KIMBERLEY DASTRUP  Procedure(s) Performed: VIDEO BRONCHOSCOPY WITH ENDOBRONCHIAL ULTRASOUND (N/A ) CRYOTHERAPY BIOPSY HEMOSTASIS CONTROL     Patient location during evaluation: PACU Anesthesia Type: General Level of consciousness: awake and patient cooperative Pain management: pain level controlled Vital Signs Assessment: post-procedure vital signs reviewed and stable Respiratory status: spontaneous breathing, respiratory function stable and patient connected to nasal cannula oxygen Cardiovascular status: blood pressure returned to baseline and stable Postop Assessment: no apparent nausea or vomiting Anesthetic complications: no   No complications documented.  Last Vitals:  Vitals:   03/05/20 1045 03/05/20 1132  BP: 129/72 (!) 163/72  Pulse: 70   Resp: 14 18  Temp: 36.6 C (!) 36.4 C  SpO2: 97%     Last Pain:  Vitals:   03/05/20 1132  TempSrc: Oral  PainSc:                  Javana Schey

## 2020-03-13 ENCOUNTER — Other Ambulatory Visit: Payer: Self-pay | Admitting: Radiation Oncology

## 2020-03-13 DIAGNOSIS — C3490 Malignant neoplasm of unspecified part of unspecified bronchus or lung: Secondary | ICD-10-CM

## 2020-03-13 NOTE — Progress Notes (Signed)
Thoracic Location of Tumor / Histology: Right Lung  Patient presented to the ER with complaints of SOB, left arm pain, and intermittent left rib pain x 1 month.  MRI Brain 03/22/2020  PET 03/22/2020  CTA Chest 03/04/2020: No acute pulmonary embolus. 8.7 x 6.6 centimeter RIGHT hilar mass encases the RIGHT LOWER lobe pulmonary artery branches and effaces them.  Occlusion of the RIGHT middle lobe pulmonary artery branch. Occlusion of the bronchus intermedius by the mass.  Postobstructive consolidation/drowned lung involving the RIGHT LOWER lobe.  Hiatal hernia.  Biopsies of Right bronchus 03/05/2020   Tobacco/Marijuana/Snuff/ETOH use: Former smoker, quit 7 years ago.  Past/Anticipated interventions by pulmonary, if any:  Dr. Valeta Harms 03/04/2020 Plan: Medical optimization prior to bronchoscopy  Agree with abx and steroids  Solumedrol 40mg  daily  Scheduled Nebs, pulmicort, brovana, yupelri  Discussed R/B/A of proceeding with bronchoscopy Patient is agreeable  Plans for bronchoscopy tomorrow 9am   Past/Anticipated interventions by cardiothoracic surgery, if any:   Past/Anticipated interventions by medical oncology, if any:  Dr. Julien Nordmann 03/20/2020 2:15 pm   Signs/Symptoms  Weight changes, if any: Lost about 30 pounds in the last month.  Respiratory complaints, if any: SOB with increased activities.  Hemoptysis, if any: None  Pain issues, if any:  No  SAFETY ISSUES:  Prior radiation? No  Pacemaker/ICD? No pacer, has loop recorder  Possible current pregnancy? n/a  Is the patient on methotrexate? No  Current Complaints / other details:   -Loop recorder

## 2020-03-14 ENCOUNTER — Ambulatory Visit
Admission: RE | Admit: 2020-03-14 | Discharge: 2020-03-14 | Disposition: A | Discharge status: Home / Self Care | Payer: Medicare HMO | Source: Ambulatory Visit | Attending: Radiation Oncology | Admitting: Radiation Oncology

## 2020-03-14 ENCOUNTER — Encounter: Payer: Self-pay | Admitting: Radiation Oncology

## 2020-03-14 ENCOUNTER — Other Ambulatory Visit: Payer: Self-pay

## 2020-03-14 VITALS — BP 125/60 | HR 79 | Temp 97.9°F | Resp 20 | Ht 76.0 in | Wt 214.0 lb

## 2020-03-14 DIAGNOSIS — C3431 Malignant neoplasm of lower lobe, right bronchus or lung: Secondary | ICD-10-CM | POA: Insufficient documentation

## 2020-03-14 DIAGNOSIS — J439 Emphysema, unspecified: Secondary | ICD-10-CM | POA: Insufficient documentation

## 2020-03-14 DIAGNOSIS — Z8673 Personal history of transient ischemic attack (TIA), and cerebral infarction without residual deficits: Secondary | ICD-10-CM | POA: Insufficient documentation

## 2020-03-14 DIAGNOSIS — K449 Diaphragmatic hernia without obstruction or gangrene: Secondary | ICD-10-CM | POA: Diagnosis not present

## 2020-03-14 DIAGNOSIS — Z86718 Personal history of other venous thrombosis and embolism: Secondary | ICD-10-CM | POA: Diagnosis not present

## 2020-03-14 DIAGNOSIS — E119 Type 2 diabetes mellitus without complications: Secondary | ICD-10-CM | POA: Diagnosis not present

## 2020-03-14 DIAGNOSIS — I10 Essential (primary) hypertension: Secondary | ICD-10-CM | POA: Diagnosis not present

## 2020-03-14 DIAGNOSIS — Z803 Family history of malignant neoplasm of breast: Secondary | ICD-10-CM | POA: Insufficient documentation

## 2020-03-14 DIAGNOSIS — Z510 Encounter for antineoplastic radiation therapy: Secondary | ICD-10-CM | POA: Insufficient documentation

## 2020-03-14 DIAGNOSIS — C3411 Malignant neoplasm of upper lobe, right bronchus or lung: Secondary | ICD-10-CM | POA: Insufficient documentation

## 2020-03-14 DIAGNOSIS — Z79899 Other long term (current) drug therapy: Secondary | ICD-10-CM | POA: Diagnosis not present

## 2020-03-14 DIAGNOSIS — Z7982 Long term (current) use of aspirin: Secondary | ICD-10-CM | POA: Diagnosis not present

## 2020-03-14 DIAGNOSIS — Z87891 Personal history of nicotine dependence: Secondary | ICD-10-CM | POA: Diagnosis not present

## 2020-03-14 DIAGNOSIS — C3491 Malignant neoplasm of unspecified part of right bronchus or lung: Secondary | ICD-10-CM

## 2020-03-14 DIAGNOSIS — M129 Arthropathy, unspecified: Secondary | ICD-10-CM | POA: Insufficient documentation

## 2020-03-14 DIAGNOSIS — F17211 Nicotine dependence, cigarettes, in remission: Secondary | ICD-10-CM | POA: Diagnosis not present

## 2020-03-14 NOTE — Progress Notes (Signed)
Radiation Oncology         ([phone]) [phone] ________________________________  Name: Scott Conley        MRN: [id number]  Date of Service: 03/14/2020 DOB: Aug 25, 1940  WE:[id number], Ples Specter, FNP  Icard, Octavio Graves, DO     REFERRING PHYSICIAN: Garner Nash, DO   DIAGNOSIS: The encounter diagnosis was Endobronchial cancer, right ([place]).   HISTORY OF PRESENT ILLNESS: Scott Conley is a 79 y.o. male seen at the request of Dr. Valeta Harms for a newly diagnosed lung cancer.  The patient presented to the emergency room complaining of shortness of breath left arm pain and rib pain for approximately 1 month's time.  On 03/04/2020 while there, a chest x-ray revealed a new masslike opacity in the right infrahilar lung, a CT angiogram with PE protocol showed no acute embolism however revealed a large 8.7 x 6.6 x 6 cm mass in the right hilar region contiguous and encircling the right lower lobe pulmonary artery branches and effacing them, there was complete occlusion of the bronchus intermedius by soft tissue mass in the consolidation/Dron lung involving the right lower lobe.  The right middle lobe pulmonary artery branches truncated as well and effacement of the right lower lobe pulmonary veins was identified.  He did get admitted and underwent bronchoscopy which was performed on 03/05/2020.  During the procedure it was quite visible that within the bronchus intermedius there was gross tumor.  Biopsy of this confirmed poorly differentiated malignancy most consistent with a squamous cell carcinoma. He is seen today to discuss treatment options of his cancer, he is tentatively scheduled for an MRI of the brain and pet imaging next Wednesday.   PREVIOUS RADIATION THERAPY: No   PAST MEDICAL HISTORY:  Past Medical History:  Diagnosis Date  . Arthritis   . Blood clot in vein   . CVA (cerebral infarction)   . Diabetes ([place])   . Hypertension   . Stroke ([place])    Left hand stiffness        PAST SURGICAL  HISTORY: Past Surgical History:  Procedure Laterality Date  . BIOPSY  03/05/2020   Procedure: BIOPSY;  Surgeon: Garner Nash, DO;  Location: [place] ENDOSCOPY;  Service: Pulmonary;;  . CRYOTHERAPY  03/05/2020   Procedure: CRYOTHERAPY;  Surgeon: Garner Nash, DO;  Location: [place] ENDOSCOPY;  Service: Pulmonary;;  . CYST REMOVAL LEG     removed from right groin  . HEMOSTASIS CONTROL  03/05/2020   Procedure: HEMOSTASIS CONTROL;  Surgeon: Garner Nash, DO;  Location: [place] ENDOSCOPY;  Service: Pulmonary;;  . LOOP RECORDER IMPLANT  11/29/13   MDT LinQ implanted by Dr Caryl Comes for cryptogenic stroke  . LOOP RECORDER IMPLANT N/A 11/29/2013   Procedure: LOOP RECORDER IMPLANT;  Surgeon: Deboraha Sprang, MD;  Location: [facility] C[facility] LAB;  Service: Cardiovascular;  Laterality: N/A;  . TEE WITHOUT CARDIOVERSION N/A 11/29/2013   Procedure: TRANSESOPHAGEAL ECHOCARDIOGRAM (TEE);  Surgeon: Sueanne Margarita, MD;  Location: [facility];  Service: Cardiovascular;  Laterality: N/A;  . VIDEO BRONCHOSCOPY WITH ENDOBRONCHIAL ULTRASOUND N/A 03/05/2020   Procedure: VIDEO BRONCHOSCOPY WITH ENDOBRONCHIAL ULTRASOUND;  Surgeon: Garner Nash, DO;  Location: [facility];  Service: Pulmonary;  Laterality: N/A;     FAMILY HISTORY:  Family History  Problem Relation Age of Onset  . Breast cancer Mother   . Breast cancer Maternal Grandmother      SOCIAL HISTORY:  reports that he has quit smoking. His smoking use included cigarettes. He started smoking about  6 years ago. He has a 29.00 pack-year smoking history. He has never used smokeless tobacco. He reports that he does not drink alcohol and does not use drugs.  The patient is widowed.  He lives in [place].  His daughter Scott Conley is present with him today as well.    ALLERGIES: Patient has no known allergies.   MEDICATIONS:  Current Outpatient Medications  Medication Sig Dispense Refill  . arformoterol (BROVANA) 15 MCG/2ML NEBU Take 2 mLs (15 mcg total) by nebulization 2 (two)  times daily. 120 mL 0  . aspirin EC 81 MG tablet Take 1 tablet (81 mg total) by mouth daily. Swallow whole. 360 tablet 0  . atorvastatin (LIPITOR) 80 MG tablet Take 1 tablet (80 mg total) by mouth daily. Must keep appt w/new provider for future refills 30 tablet 0  . bisacodyl (DULCOLAX) 10 MG suppository Place 1 suppository (10 mg total) rectally daily as needed for severe constipation. 12 suppository 0  . budesonide (PULMICORT) 0.25 MG/2ML nebulizer solution Take 2 mLs (0.25 mg total) by nebulization 2 (two) times daily. 180 each 0  . carvedilol (COREG) 6.25 MG tablet Take 1 tablet (6.25 mg total) by mouth 2 (two) times daily with a meal. 60 tablet 0  . docusate sodium (COLACE) 100 MG capsule Take 200 mg by mouth 2 (two) times daily.    . polyethylene glycol (MIRALAX) 17 g packet Take 17 g by mouth daily as needed for moderate constipation. 14 each 0  . senna (SENOKOT) 8.6 MG TABS tablet Take 2 tablets (17.2 mg total) by mouth daily as needed for moderate constipation. 120 tablet 0  . senna-docusate (SENOKOT-S) 8.6-50 MG per tablet Take 1 tablet by mouth at bedtime. For constipation (Patient not taking: Reported on 03/04/2020) 90 tablet 1  . sitaGLIPtin (JANUVIA) 100 MG tablet Take 1 tablet (100 mg total) by mouth daily. Must keep appt w/new provider for future refills (Patient not taking: Reported on 03/04/2020) 30 tablet 1   No current facility-administered medications for this encounter.     REVIEW OF SYSTEMS: On review of systems, the patient reports that he is doing well overall. He reports cough  denies any chest pain, shortness of breath at rest, cough, fevers, chills, night sweats. He has noted about 30 pounds of unintended weight changes. He does have shortness of breath however with exertion. denies any bowel or bladder disturbances, and denies abdominal pain, nausea or vomiting. He does have chest wall soreness on the left. Otherwise, he denies any new musculoskeletal or joint aches or  pains. A complete review of systems is obtained and is otherwise negative.     PHYSICAL EXAM:  Wt Readings from Last 3 Encounters:  03/05/20 214 lb (97.1 kg)  11/21/16 265 lb 12.8 oz (120.6 kg)  11/21/15 248 lb (112.5 kg)   Temp Readings from Last 3 Encounters:  03/05/20 (!) 97.5 F (36.4 C) (Oral)  11/21/16 97.9 F (36.6 C) (Oral)  11/21/15 97.9 F (36.6 C) (Oral)   BP Readings from Last 3 Encounters:  03/05/20 (!) 163/72  11/21/16 124/70  11/21/15 140/84   Pulse Readings from Last 3 Encounters:  03/05/20 70  11/21/16 75  11/21/15 72   In general this is a well appearing African American male in no acute distress. He's alert and oriented x4 and appropriate throughout the examination. Cardiopulmonary assessment is negative for acute distress and he exhibits normal effort.     ECOG = 0  0 - Asymptomatic (Fully active, able to  carry on all predisease activities without restriction)  1 - Symptomatic but completely ambulatory (Restricted in physically strenuous activity but ambulatory and able to carry out work of a light or sedentary nature. For example, light housework, office work)  2 - Symptomatic, <50% in bed during the day (Ambulatory and capable of all self care but unable to carry out any work activities. Up and about more than 50% of waking hours)  3 - Symptomatic, >50% in bed, but not bedbound (Capable of only limited self-care, confined to bed or chair 50% or more of waking hours)  4 - Bedbound (Completely disabled. Cannot carry on any self-care. Totally confined to bed or chair)  5 - Death   Eustace Pen MM, Creech RH, Tormey DC, et al. [phone]). "Toxicity and response criteria of the [facility]". [place] Oncol. 5 (6): 649-55    LABORATORY DATA:  Lab Results  Component Value Date   WBC 9.6 03/05/2020   HGB 8.5 (L) 03/05/2020   HCT 29.0 (L) 03/05/2020   MCV 75.1 (L) 03/05/2020   PLT 368 03/05/2020   Lab Results  Component Value  Date   NA 138 03/05/2020   K 4.9 03/05/2020   CL 104 03/05/2020   CO2 23 03/05/2020   Lab Results  Component Value Date   ALT 12 03/05/2020   AST 14 (L) 03/05/2020   ALKPHOS 51 03/05/2020   BILITOT 0.7 03/05/2020      RADIOGRAPHY: DG Chest 2 View  Result Date: 03/04/2020 CLINICAL DATA:  Dyspnea EXAM: CHEST - 2 VIEW COMPARISON:  11/27/2013 chest radiograph. FINDINGS: Loop recorder overlies the medial left lower chest. Stable cardiomediastinal silhouette with normal heart size. No pneumothorax. No pleural effusion. New masslike opacity in the right infrahilar lung. No pulmonary edema. IMPRESSION: New masslike opacity in the right infrahilar lung, cannot exclude a lung mass. Recommend further evaluation with chest CT with IV contrast. Electronically Signed   By: Ilona Sorrel M.D.   On: 03/04/2020 09:35   CT Angio Chest PE W and/or Wo Contrast  Result Date: 03/04/2020 CLINICAL DATA:  Chest pain, shortness of breath. Concern for malignancy. History of DVT. EXAM: CT ANGIOGRAPHY CHEST WITH CONTRAST TECHNIQUE: Multidetector CT imaging of the chest was performed using the standard protocol during bolus administration of intravenous contrast. Multiplanar CT image reconstructions and MIPs were obtained to evaluate the vascular anatomy. CONTRAST:  61 ml OMNIPAQUE IOHEXOL 350 MG/ML SOLN COMPARISON:  Chest x-ray on 03/04/2020 FINDINGS: Cardiovascular: Heart size is normal. Coronary artery calcifications are present. There is atherosclerotic calcification of the thoracic aorta not associated with aneurysm. No pericardial effusion. The pulmonary arteries are well opacified by contrast bolus. There is no acute pulmonary embolus. RIGHT hilar mass encases and effaces the RIGHT LOWER lobe pulmonary artery branches. The RIGHT middle lobe pulmonary artery branch is truncated, best seen on image 159 of series 6. Effaced RIGHT LOWER lobe pulmonary veins. Mediastinum/Nodes: No significant mediastinal or LEFT hilar  adenopathy. Confluent opacity in the RIGHT final precludes evaluation of individual lymph nodes. Axillary regions are unremarkable. The visualized portion of the thyroid gland has a normal appearance. Hiatal hernia. Otherwise the esophagus is normal. Lungs/Pleura: Centrilobular and paraseptal emphysematous changes are identified primarily within the UPPER lobes. There is a low-attenuation mass in the RIGHT hilar region measuring 8.7 x 6.6 x 6.0 centimeters. Mass is contiguous mass encircles the RIGHT LOWER lobe pulmonary artery branches and effaces them. There is complete occlusion of the bronchus intermedius by soft tissue  mass. There is consolidations/drowned lung involving the RIGHT LOWER lobe. Upper Abdomen: Adrenal glands are normal. Gallbladder is present. The appearance of the liver is normal accounting for contrast bolus timing. Musculoskeletal: Mild midthoracic degenerative changes. No suspicious lytic or blastic lesions are identified. Review of the MIP images confirms the above findings. IMPRESSION: 1. No acute pulmonary embolus. 2. 8.7 x 6.6 centimeter RIGHT hilar mass encases the RIGHT LOWER lobe pulmonary artery branches and effaces them. 3. Occlusion of the RIGHT middle lobe pulmonary artery branch. 4. Occlusion of the bronchus intermedius by the mass. 5. Postobstructive consolidation/drowned lung involving the RIGHT LOWER lobe. 6. Hiatal hernia. 7. Aortic Atherosclerosis (ICD10-I70.0) and Emphysema (ICD10-J43.9). These results were called by telephone at the time of interpretation on 03/04/2020 at 2:42 pm to provider Dr. Pattricia Boss, who verbally acknowledged these results. Electronically Signed   By: Nolon Nations M.D.   On: 03/04/2020 14:43       IMPRESSION/PLAN: 1. At least Stage IIIA, cT4NxMx, NSCLC, Squamous Cell Carcinoma of the right upper lobe. Dr. Lisbeth Renshaw discusses the pathology findings and reviews the nature of locally advanced disease of the lung. We will follow up with his PET and  MRI of the brain next Wednesday, but would hope to offer a course of chemoRT. We discussed the risks, benefits, short, and long term effects of radiotherapy, and the patient is interested in proceeding. Dr. Lisbeth Renshaw discusses the delivery and logistics of radiotherapy and anticipates a course of 6 1/2 weeks of radiotherapy. Written consent is obtained and placed in the chart, a copy was provided to the patient. He will simulate today after this visit. We would anticipate starting therapy on 03/27/20. 2. Transportation needs. Our staff has reached out about transportation that the patient will need during treatment.  In a visit lasting 60 minutes, greater than 50% of the time was spent face to face discussing the patient's condition, in preparation for the discussion, and coordinating the patient's care.     The above documentation reflects my direct findings during this shared patient visit. Please see the separate note by Dr. Lisbeth Renshaw on this date for the remainder of the patient's plan of care.    Carola Rhine, PAC

## 2020-03-16 ENCOUNTER — Encounter: Payer: Self-pay | Admitting: *Deleted

## 2020-03-16 ENCOUNTER — Other Ambulatory Visit: Payer: Self-pay | Admitting: *Deleted

## 2020-03-16 NOTE — Progress Notes (Signed)
Per Dr. Julien Nordmann, I notified the pathology dept to send recent tissue for molecular testing and PDL 1 to [facility].

## 2020-03-16 NOTE — Progress Notes (Signed)
The proposed treatment discussed in cancer conference 03/16/20 is for discussion purpose only and is not a binding recommendation.  The patient was not physically examined nor present for their treatment options.  Therefore, final treatment plans cannot be decided.

## 2020-03-20 ENCOUNTER — Encounter: Payer: Self-pay | Admitting: *Deleted

## 2020-03-20 ENCOUNTER — Inpatient Hospital Stay: Payer: Medicare HMO | Attending: Internal Medicine | Admitting: Internal Medicine

## 2020-03-20 ENCOUNTER — Encounter (HOSPITAL_COMMUNITY): Payer: Self-pay

## 2020-03-20 ENCOUNTER — Other Ambulatory Visit: Payer: Self-pay

## 2020-03-20 ENCOUNTER — Inpatient Hospital Stay: Payer: Medicare HMO

## 2020-03-20 VITALS — BP 118/89 | HR 92 | Temp 98.4°F | Resp 18 | Ht 76.0 in | Wt 206.0 lb

## 2020-03-20 DIAGNOSIS — D509 Iron deficiency anemia, unspecified: Secondary | ICD-10-CM | POA: Diagnosis not present

## 2020-03-20 DIAGNOSIS — I10 Essential (primary) hypertension: Secondary | ICD-10-CM | POA: Insufficient documentation

## 2020-03-20 DIAGNOSIS — C3431 Malignant neoplasm of lower lobe, right bronchus or lung: Secondary | ICD-10-CM

## 2020-03-20 DIAGNOSIS — Z79899 Other long term (current) drug therapy: Secondary | ICD-10-CM | POA: Diagnosis not present

## 2020-03-20 DIAGNOSIS — R634 Abnormal weight loss: Secondary | ICD-10-CM | POA: Insufficient documentation

## 2020-03-20 DIAGNOSIS — I6782 Cerebral ischemia: Secondary | ICD-10-CM | POA: Insufficient documentation

## 2020-03-20 DIAGNOSIS — R630 Anorexia: Secondary | ICD-10-CM | POA: Diagnosis not present

## 2020-03-20 DIAGNOSIS — Z87891 Personal history of nicotine dependence: Secondary | ICD-10-CM

## 2020-03-20 DIAGNOSIS — R0609 Other forms of dyspnea: Secondary | ICD-10-CM | POA: Diagnosis not present

## 2020-03-20 DIAGNOSIS — M1990 Unspecified osteoarthritis, unspecified site: Secondary | ICD-10-CM | POA: Diagnosis not present

## 2020-03-20 DIAGNOSIS — R531 Weakness: Secondary | ICD-10-CM | POA: Insufficient documentation

## 2020-03-20 DIAGNOSIS — Z803 Family history of malignant neoplasm of breast: Secondary | ICD-10-CM

## 2020-03-20 DIAGNOSIS — Z8379 Family history of other diseases of the digestive system: Secondary | ICD-10-CM

## 2020-03-20 DIAGNOSIS — I700 Atherosclerosis of aorta: Secondary | ICD-10-CM | POA: Insufficient documentation

## 2020-03-20 DIAGNOSIS — K449 Diaphragmatic hernia without obstruction or gangrene: Secondary | ICD-10-CM | POA: Insufficient documentation

## 2020-03-20 DIAGNOSIS — E119 Type 2 diabetes mellitus without complications: Secondary | ICD-10-CM | POA: Diagnosis not present

## 2020-03-20 DIAGNOSIS — R5383 Other fatigue: Secondary | ICD-10-CM | POA: Diagnosis not present

## 2020-03-20 DIAGNOSIS — M6281 Muscle weakness (generalized): Secondary | ICD-10-CM | POA: Insufficient documentation

## 2020-03-20 DIAGNOSIS — I723 Aneurysm of iliac artery: Secondary | ICD-10-CM | POA: Diagnosis not present

## 2020-03-20 DIAGNOSIS — R0600 Dyspnea, unspecified: Secondary | ICD-10-CM | POA: Diagnosis not present

## 2020-03-20 DIAGNOSIS — R079 Chest pain, unspecified: Secondary | ICD-10-CM | POA: Diagnosis not present

## 2020-03-20 DIAGNOSIS — R05 Cough: Secondary | ICD-10-CM

## 2020-03-20 DIAGNOSIS — Z7189 Other specified counseling: Secondary | ICD-10-CM

## 2020-03-20 DIAGNOSIS — J439 Emphysema, unspecified: Secondary | ICD-10-CM | POA: Insufficient documentation

## 2020-03-20 DIAGNOSIS — Z8673 Personal history of transient ischemic attack (TIA), and cerebral infarction without residual deficits: Secondary | ICD-10-CM | POA: Diagnosis not present

## 2020-03-20 DIAGNOSIS — Z8042 Family history of malignant neoplasm of prostate: Secondary | ICD-10-CM

## 2020-03-20 DIAGNOSIS — Z86718 Personal history of other venous thrombosis and embolism: Secondary | ICD-10-CM | POA: Insufficient documentation

## 2020-03-20 DIAGNOSIS — K5900 Constipation, unspecified: Secondary | ICD-10-CM | POA: Insufficient documentation

## 2020-03-20 DIAGNOSIS — R918 Other nonspecific abnormal finding of lung field: Secondary | ICD-10-CM

## 2020-03-20 DIAGNOSIS — R911 Solitary pulmonary nodule: Secondary | ICD-10-CM | POA: Diagnosis not present

## 2020-03-20 DIAGNOSIS — Z5111 Encounter for antineoplastic chemotherapy: Secondary | ICD-10-CM

## 2020-03-20 LAB — CBC WITH DIFFERENTIAL (CANCER CENTER ONLY)
Abs Immature Granulocytes: 0.06 10*3/uL (ref 0.00–0.07)
Basophils Absolute: 0.1 10*3/uL (ref 0.0–0.1)
Basophils Relative: 0 %
Eosinophils Absolute: 0.3 10*3/uL (ref 0.0–0.5)
Eosinophils Relative: 2 %
HCT: 30.5 % — ABNORMAL LOW (ref 39.0–52.0)
Hemoglobin: 9.3 g/dL — ABNORMAL LOW (ref 13.0–17.0)
Immature Granulocytes: 0 %
Lymphocytes Relative: 17 %
Lymphs Abs: 2.3 10*3/uL (ref 0.7–4.0)
MCH: 22.6 pg — ABNORMAL LOW (ref 26.0–34.0)
MCHC: 30.5 g/dL (ref 30.0–36.0)
MCV: 74.2 fL — ABNORMAL LOW (ref 80.0–100.0)
Monocytes Absolute: 1 10*3/uL (ref 0.1–1.0)
Monocytes Relative: 7 %
Neutro Abs: 10.2 10*3/uL — ABNORMAL HIGH (ref 1.7–7.7)
Neutrophils Relative %: 74 %
Platelet Count: 414 10*3/uL — ABNORMAL HIGH (ref 150–400)
RBC: 4.11 MIL/uL — ABNORMAL LOW (ref 4.22–5.81)
RDW: 19.8 % — ABNORMAL HIGH (ref 11.5–15.5)
WBC Count: 13.8 10*3/uL — ABNORMAL HIGH (ref 4.0–10.5)
nRBC: 0 % (ref 0.0–0.2)

## 2020-03-20 LAB — CMP (CANCER CENTER ONLY)
ALT: 11 U/L (ref 0–44)
AST: 11 U/L — ABNORMAL LOW (ref 15–41)
Albumin: 2.5 g/dL — ABNORMAL LOW (ref 3.5–5.0)
Alkaline Phosphatase: 77 U/L (ref 38–126)
Anion gap: 12 (ref 5–15)
BUN: 16 mg/dL (ref 8–23)
CO2: 21 mmol/L — ABNORMAL LOW (ref 22–32)
Calcium: 9.2 mg/dL (ref 8.9–10.3)
Chloride: 103 mmol/L (ref 98–111)
Creatinine: 0.94 mg/dL (ref 0.61–1.24)
GFR, Est AFR Am: >60 mL/min (ref >60)
GFR, Estimated: >60 mL/min (ref >60)
Glucose, Bld: 125 mg/dL — ABNORMAL HIGH (ref 70–99)
Potassium: 3.8 mmol/L (ref 3.5–5.1)
Sodium: 136 mmol/L (ref 135–145)
Total Bilirubin: 0.8 mg/dL (ref 0.3–1.2)
Total Protein: 7.4 g/dL (ref 6.5–8.1)

## 2020-03-20 MED ORDER — PROCHLORPERAZINE MALEATE 10 MG PO TABS: 10.0000 mg | ORAL_TABLET | Freq: Four times a day (QID) | ORAL | 0 refills | Status: AC | PRN

## 2020-03-20 NOTE — Progress Notes (Signed)

## 2020-03-20 NOTE — Progress Notes (Signed)
[facility] [phone]   [phone]  CONSULT NOTE  REFERRING PHYSICIAN: Dr. Leory Plowman Icard  REASON FOR CONSULTATION:  79 years old African-American male recently diagnosed with lung cancer.  HPI Scott Conley is a 79 y.o. male with past medical history significant for osteoarthritis, stroke, deep venous thrombosis, diabetes mellitus, hypertension and long history of smoking.  The patient mentions that he had a productive cough as well as shortness of breath left arm and back pain for almost a month.  His cough was getting worse and the patient presented to the emergency department on 03/04/2020 with the same complaint.  Chest x-ray on 03/04/2020 showed new masslike opacity in the right infrahilar lung.  This was followed by CT angiogram of the chest on the same day and that showed no acute pulmonary embolus but there was 8.7 x 6.6 cm right hilar mass encasing the right lower lobe pulmonary artery branches and effaces them.  There was also occlusion of the right middle lobe pulmonary artery branch and occlusion of the bronchus intermedius by the mass.  There was postobstructive consolidation/drowned lung involving the right lower lobe.  The patient was seen by Dr. Valeta Harms and on 03/05/2020 he underwent bronchoscopy with tumor debulking of the right bronchus intermedius as well as cryotherapy and endobronchial cryo biopsies for relief of the obstruction and hemostasis. The final pathology [id number]) showed poorly differentiated malignancy. There is extensive necrosis with scattered foci of viable poorly differentiated malignancy characterized by cells with moderate to abundant eosinophilic cytoplasm and marked nuclear atypia. The morphologic features favor poorly differentiated squamous cell carcinoma with necrosis. Immunohistochemistry shows the malignant cells are strongly  positive with TTF-1 and are weakly positive with p63 and there are a few positive foci with  positive with p40. The tumor is negative with cytokeratin 7 and cytokeratin 5/6. The strong TTF-1 positivity is most consistent with poorly differentiated adenocarcinoma. The patient was referred to Dr. Lisbeth Renshaw for consideration of palliative radiotherapy to the obstructive mass.  He was also referred to me today for evaluation and discussion of his treatment options. When seen today he continues to have weakness in the left arm after his stroke several years ago.  He has no chest pain but has shortness of breath with exertion and mild cough with no hemoptysis.  He had a weight loss of over 50 pounds in the last 4 months.  He has no nausea, vomiting, diarrhea or constipation.  He denied having any headache or visual changes. Family history significant for mother who died at age 59 and has breast cancer several years before.  Father also died at age 69 and had history of prostate cancer.  He had a brother with liver cirrhosis. The patient is a widow and has 4 children 2 sons and 2 daughters.  He was accompanied today by a family friend Scott Conley and his daughter Scott Conley was available by phone.  The patient is currently retired and used to work in [employer].  He has a history of smoking 1 pack/day for around 50 years and quit in 2015.  He also has a history of alcohol abuse in the past but not recently and no history of drug abuse.  HPI  Past Medical History:  Diagnosis Date  . Arthritis   . Blood clot in vein   . CVA (cerebral infarction)   . Diabetes ([place])   . Hypertension   . Stroke [facility]    Left hand stiffness  Past Surgical History:  Procedure Laterality Date  . BIOPSY  03/05/2020   Procedure: BIOPSY;  Surgeon: Garner Nash, DO;  Location: [facility];  Service: Pulmonary;;  . CRYOTHERAPY  03/05/2020   Procedure: CRYOTHERAPY;  Surgeon: Garner Nash, DO;  Location: [facility];  Service: Pulmonary;;  . CYST REMOVAL LEG     removed from right groin  . HEMOSTASIS CONTROL   03/05/2020   Procedure: HEMOSTASIS CONTROL;  Surgeon: Garner Nash, DO;  Location: [facility];  Service: Pulmonary;;  . LOOP RECORDER IMPLANT  11/29/13   MDT LinQ implanted by Dr Caryl Comes for cryptogenic stroke  . LOOP RECORDER IMPLANT N/A 11/29/2013   Procedure: LOOP RECORDER IMPLANT;  Surgeon: Deboraha Sprang, MD;  Location: [facility] C[facility] LAB;  Service: Cardiovascular;  Laterality: N/A;  . TEE WITHOUT CARDIOVERSION N/A 11/29/2013   Procedure: TRANSESOPHAGEAL ECHOCARDIOGRAM (TEE);  Surgeon: Sueanne Margarita, MD;  Location: [facility];  Service: Cardiovascular;  Laterality: N/A;  . VIDEO BRONCHOSCOPY WITH ENDOBRONCHIAL ULTRASOUND N/A 03/05/2020   Procedure: VIDEO BRONCHOSCOPY WITH ENDOBRONCHIAL ULTRASOUND;  Surgeon: Garner Nash, DO;  Location: [facility];  Service: Pulmonary;  Laterality: N/A;    Family History  Problem Relation Age of Onset  . Breast cancer Mother   . Breast cancer Maternal Grandmother     Social History Social History   Tobacco Use  . Smoking status: Former Smoker    Packs/day: 0.50    Years: 58.00    Pack years: 29.00    Types: Cigarettes    Start date: 11/30/2013  . Smokeless tobacco: Never Used  Vaping Use  . Vaping Use: Never used  Substance Use Topics  . Alcohol use: No    Comment: none since 1985  . Drug use: No    No Known Allergies  Current Outpatient Medications  Medication Sig Dispense Refill  . arformoterol (BROVANA) 15 MCG/2ML NEBU Take 2 mLs (15 mcg total) by nebulization 2 (two) times daily. 120 mL 0  . aspirin EC 81 MG tablet Take 1 tablet (81 mg total) by mouth daily. Swallow whole. 360 tablet 0  . atorvastatin (LIPITOR) 80 MG tablet Take 1 tablet (80 mg total) by mouth daily. Must keep appt w/new provider for future refills 30 tablet 0  . bisacodyl (DULCOLAX) 10 MG suppository Place 1 suppository (10 mg total) rectally daily as needed for severe constipation. 12 suppository 0  . budesonide (PULMICORT) 0.25 MG/2ML nebulizer solution Take 2 mLs  (0.25 mg total) by nebulization 2 (two) times daily. 180 each 0  . carvedilol (COREG) 6.25 MG tablet Take 1 tablet (6.25 mg total) by mouth 2 (two) times daily with a meal. 60 tablet 0  . docusate sodium (COLACE) 100 MG capsule Take 200 mg by mouth 2 (two) times daily.    . polyethylene glycol (MIRALAX) 17 g packet Take 17 g by mouth daily as needed for moderate constipation. 14 each 0  . senna (SENOKOT) 8.6 MG TABS tablet Take 2 tablets (17.2 mg total) by mouth daily as needed for moderate constipation. 120 tablet 0  . senna-docusate (SENOKOT-S) 8.6-50 MG per tablet Take 1 tablet by mouth at bedtime. For constipation (Patient not taking: Reported on 03/04/2020) 90 tablet 1  . sitaGLIPtin (JANUVIA) 100 MG tablet Take 1 tablet (100 mg total) by mouth daily. Must keep appt w/new provider for future refills (Patient not taking: Reported on 03/04/2020) 30 tablet 1   No current facility-administered medications for this visit.    Review of Systems  Constitutional: positive for anorexia, fatigue and weight loss Eyes: negative Ears, nose, mouth, throat, and face: negative Respiratory: positive for cough and dyspnea on exertion Cardiovascular: negative Gastrointestinal: negative Genitourinary:negative Integument/breast: negative Hematologic/lymphatic: negative Musculoskeletal:positive for muscle weakness Neurological: negative Behavioral/Psych: negative Endocrine: negative Allergic/Immunologic: negative  Physical Exam  QJJ:HERDE, healthy, no distress, well nourished and well developed SKIN: skin color, texture, turgor are normal, no rashes or significant lesions HEAD: Normocephalic, No masses, lesions, tenderness or abnormalities EYES: normal, PERRLA, Conjunctiva are pink and non-injected EARS: External ears normal, Canals clear OROPHARYNX:no exudate, no erythema and lips, buccal mucosa, and tongue normal  NECK: supple, no adenopathy, no JVD LYMPH:  no palpable lymphadenopathy, no  hepatosplenomegaly LUNGS: clear to auscultation , and palpation HEART: regular rate & rhythm, no murmurs and no gallops ABDOMEN:abdomen soft, non-tender, normal bowel sounds and no masses or organomegaly BACK: No CVA tenderness, Range of motion is normal EXTREMITIES:no joint deformities, effusion, or inflammation, no edema  NEURO: alert & oriented x 3 with fluent speech, no focal motor/sensory deficits  PERFORMANCE STATUS: ECOG 1  LABORATORY DATA: Lab Results  Component Value Date   WBC 13.8 (H) 03/20/2020   HGB 9.3 (L) 03/20/2020   HCT 30.5 (L) 03/20/2020   MCV 74.2 (L) 03/20/2020   PLT 414 (H) 03/20/2020      Chemistry      Component Value Date/Time   NA 138 03/05/2020 0620   K 4.9 03/05/2020 0620   CL 104 03/05/2020 0620   CO2 23 03/05/2020 0620   BUN 12 03/05/2020 0620   CREATININE 0.81 03/05/2020 0620      Component Value Date/Time   CALCIUM 8.5 (L) 03/05/2020 0620   ALKPHOS 51 03/05/2020 0620   AST 14 (L) 03/05/2020 0620   ALT 12 03/05/2020 0620   BILITOT 0.7 03/05/2020 0620       RADIOGRAPHIC STUDIES: DG Chest 2 View  Result Date: 03/04/2020 CLINICAL DATA:  Dyspnea EXAM: CHEST - 2 VIEW COMPARISON:  11/27/2013 chest radiograph. FINDINGS: Loop recorder overlies the medial left lower chest. Stable cardiomediastinal silhouette with normal heart size. No pneumothorax. No pleural effusion. New masslike opacity in the right infrahilar lung. No pulmonary edema. IMPRESSION: New masslike opacity in the right infrahilar lung, cannot exclude a lung mass. Recommend further evaluation with chest CT with IV contrast. Electronically Signed   By: Ilona Sorrel M.D.   On: 03/04/2020 09:35   CT Angio Chest PE W and/or Wo Contrast  Result Date: 03/04/2020 CLINICAL DATA:  Chest pain, shortness of breath. Concern for malignancy. History of DVT. EXAM: CT ANGIOGRAPHY CHEST WITH CONTRAST TECHNIQUE: Multidetector CT imaging of the chest was performed using the standard protocol during bolus  administration of intravenous contrast. Multiplanar CT image reconstructions and MIPs were obtained to evaluate the vascular anatomy. CONTRAST:  61 ml OMNIPAQUE IOHEXOL 350 MG/ML SOLN COMPARISON:  Chest x-ray on 03/04/2020 FINDINGS: Cardiovascular: Heart size is normal. Coronary artery calcifications are present. There is atherosclerotic calcification of the thoracic aorta not associated with aneurysm. No pericardial effusion. The pulmonary arteries are well opacified by contrast bolus. There is no acute pulmonary embolus. RIGHT hilar mass encases and effaces the RIGHT LOWER lobe pulmonary artery branches. The RIGHT middle lobe pulmonary artery branch is truncated, best seen on image 159 of series 6. Effaced RIGHT LOWER lobe pulmonary veins. Mediastinum/Nodes: No significant mediastinal or LEFT hilar adenopathy. Confluent opacity in the RIGHT final precludes evaluation of individual lymph nodes. Axillary regions are unremarkable. The visualized portion of the thyroid  gland has a normal appearance. Hiatal hernia. Otherwise the esophagus is normal. Lungs/Pleura: Centrilobular and paraseptal emphysematous changes are identified primarily within the UPPER lobes. There is a low-attenuation mass in the RIGHT hilar region measuring 8.7 x 6.6 x 6.0 centimeters. Mass is contiguous mass encircles the RIGHT LOWER lobe pulmonary artery branches and effaces them. There is complete occlusion of the bronchus intermedius by soft tissue mass. There is consolidations/drowned lung involving the RIGHT LOWER lobe. Upper Abdomen: Adrenal glands are normal. Gallbladder is present. The appearance of the liver is normal accounting for contrast bolus timing. Musculoskeletal: Mild midthoracic degenerative changes. No suspicious lytic or blastic lesions are identified. Review of the MIP images confirms the above findings. IMPRESSION: 1. No acute pulmonary embolus. 2. 8.7 x 6.6 centimeter RIGHT hilar mass encases the RIGHT LOWER lobe pulmonary  artery branches and effaces them. 3. Occlusion of the RIGHT middle lobe pulmonary artery branch. 4. Occlusion of the bronchus intermedius by the mass. 5. Postobstructive consolidation/drowned lung involving the RIGHT LOWER lobe. 6. Hiatal hernia. 7. Aortic Atherosclerosis (ICD10-I70.0) and Emphysema (ICD10-J43.9). These results were called by telephone at the time of interpretation on 03/04/2020 at 2:42 pm to provider Dr. Pattricia Boss, who verbally acknowledged these results. Electronically Signed   By: Nolon Nations M.D.   On: 03/04/2020 14:43    ASSESSMENT: This is a very pleasant 79 years old African-American male with likely stage IIIb (T3, N2, M0) non-small cell lung cancer, poorly differentiated adenocarcinoma presented with bulky right hilar mass with encasement of the right lower lobe pulmonary artery branches as well as occlusion of the right middle lobe pulmonary artery branch and occlusion of the bronchus intermedius with postobstructive consolidation and round right lower lobe and likely mediastinal invasion diagnosed in August 2021 awaiting further staging work-up.   PLAN: I had a lengthy discussion with the patient and his friend and daughter who was available by phone about his current disease stage, prognosis and treatment options. I recommended for the patient to complete the staging work-up and he is already have a PET scan and MRI of the brain is scheduled to be done in 2 days. I will request the tissue block to be sent for molecular studies and PD-L1 expression if there is sufficient material. I discussed with the patient his treatment options and if there is no evidence of metastatic disease outside the currently known mass, we will consider the patient for a course of concurrent chemoradiation with weekly carboplatin for AUC of 2 and paclitaxel 45 mg/M2 for 6-7 weeks.  This will be followed by consolidation immunotherapy with Imfinzi if no evidence for disease progression after the  induction phase. I discussed with the patient the adverse effect of this treatment including but not limited to alopecia, myelosuppression, nausea and vomiting, peripheral neuropathy, liver or renal dysfunction. I will arrange for the patient to have a chemotherapy education class before the first dose of his treatment. I will also send prescription for Compazine 10 mg p.o. every 6 hours as needed for nausea to his pharmacy. The patient will come back for follow-up visit with the start of the first cycle of his treatment for evaluation and discussion of the scan results. He was advised to call immediately if he has any other concerning symptoms in the interval. The patient voices understanding of current disease status and treatment options and is in agreement with the current care plan.  All questions were answered. The patient knows to call the clinic with any problems, questions  or concerns. We can certainly see the patient much sooner if necessary.  Thank you so much for allowing me to participate in the care of Scott Conley. I will continue to follow up the patient with you and assist in his care.  The total time spent in the appointment was 90 minutes.  Disclaimer: This note was dictated with voice recognition software. Similar sounding words can inadvertently be transcribed and may not be corrected upon review.   Eilleen Kempf March 20, 2020, 2:10 PM

## 2020-03-21 ENCOUNTER — Encounter (HOSPITAL_COMMUNITY): Payer: Self-pay | Admitting: Internal Medicine

## 2020-03-21 ENCOUNTER — Telehealth: Payer: Self-pay | Admitting: *Deleted

## 2020-03-21 ENCOUNTER — Encounter: Payer: Self-pay | Admitting: *Deleted

## 2020-03-21 NOTE — Telephone Encounter (Signed)
Foundation one and PDL 1 sent

## 2020-03-21 NOTE — Progress Notes (Signed)
Per Dr. Julien Nordmann, I notified pathology dept to send Foundation One and PDL 1 testing on recent bx.

## 2020-03-22 ENCOUNTER — Ambulatory Visit (HOSPITAL_COMMUNITY)
Admission: RE | Admit: 2020-03-22 | Discharge: 2020-03-22 | Disposition: A | Discharge status: Home / Self Care | Payer: Medicare HMO | Source: Ambulatory Visit | Attending: Radiation Oncology | Admitting: Radiation Oncology

## 2020-03-22 ENCOUNTER — Other Ambulatory Visit: Payer: Self-pay

## 2020-03-22 DIAGNOSIS — J439 Emphysema, unspecified: Secondary | ICD-10-CM | POA: Insufficient documentation

## 2020-03-22 DIAGNOSIS — C3490 Malignant neoplasm of unspecified part of unspecified bronchus or lung: Secondary | ICD-10-CM

## 2020-03-22 DIAGNOSIS — I723 Aneurysm of iliac artery: Secondary | ICD-10-CM | POA: Diagnosis not present

## 2020-03-22 DIAGNOSIS — C342 Malignant neoplasm of middle lobe, bronchus or lung: Secondary | ICD-10-CM | POA: Diagnosis not present

## 2020-03-22 DIAGNOSIS — I700 Atherosclerosis of aorta: Secondary | ICD-10-CM | POA: Diagnosis not present

## 2020-03-22 DIAGNOSIS — G319 Degenerative disease of nervous system, unspecified: Secondary | ICD-10-CM | POA: Diagnosis not present

## 2020-03-22 DIAGNOSIS — I6782 Cerebral ischemia: Secondary | ICD-10-CM | POA: Insufficient documentation

## 2020-03-22 LAB — GLUCOSE, CAPILLARY: Glucose-Capillary: 127 mg/dL — ABNORMAL HIGH (ref 70–99)

## 2020-03-22 MED ORDER — GADOBUTROL 1 MMOL/ML IV SOLN
9.0000 mL | Freq: Once | INTRAVENOUS | Status: AC | PRN
  Administered 2020-03-22: 9 mL via INTRAVENOUS

## 2020-03-22 MED ORDER — FLUDEOXYGLUCOSE F - 18 (FDG) INJECTION
10.4000 | Freq: Once | INTRAVENOUS | Status: AC | PRN
  Administered 2020-03-22: 10.4 via INTRAVENOUS

## 2020-03-24 ENCOUNTER — Encounter: Payer: Self-pay | Admitting: Radiation Oncology

## 2020-03-24 ENCOUNTER — Encounter: Payer: Self-pay | Admitting: Internal Medicine

## 2020-03-24 ENCOUNTER — Inpatient Hospital Stay: Payer: Medicare HMO

## 2020-03-24 ENCOUNTER — Telehealth: Payer: Self-pay | Admitting: Internal Medicine

## 2020-03-24 DIAGNOSIS — C3491 Malignant neoplasm of unspecified part of right bronchus or lung: Secondary | ICD-10-CM | POA: Diagnosis not present

## 2020-03-24 NOTE — Progress Notes (Signed)
Unfortunately there aren't any foundations offering copay assistance for his Dx and the type of ins he has.  I emailed Vincente Liberty and Ailene Ravel in the radiation dept requesting they reach out to him regarding the [employer].

## 2020-03-24 NOTE — Progress Notes (Signed)
Called and spoke to pt in reference to [employer].  Pt states he will try and find a bank statement to bring in.

## 2020-03-24 NOTE — Telephone Encounter (Signed)
Called and spoke with patient. Confirmed appts. Wants to change chemo edu to phone visit. Changed as requested

## 2020-03-24 NOTE — Progress Notes (Signed)
[facility] OFFICE PROGRESS NOTE  [place], FNP [address] [place] [address]  DIAGNOSIS: Stage IIIB (T3, N2, M0) Non-Small Cell Lung Cancer, Adenocarcinoma. He presented with a right paratracheal mass, nodal pleural thickening posterior left lung apex. There was marked focal hypermetabolism in the right colon, in the region the ileocecal valve as well as hypermetabolism in the distal esophagus which may be related to esophagitis but cannot exclude neoplasm. He was diagnosed in August 2021.   PDL1 Expression: 100%  Molecular Biomarkers: Microsatellite status - MS-Stable Tumor Mutational Burden - 4 Muts/Mb Genomic Findings For a complete list of the genes assayed, please refer to the Appendix. KRAS G12V CDKN2A/B p16INK4a W110* and p14ARF G125R JAK2 V617F TERT promoter -124C>T TP53 W91* 7 Disease relevant genes with no reportable alterations: ALK, BRAF, EGFR, ERBB2, MET, RET, ROS1  PRIOR THERAPY: None  CURRENT THERAPY: Concurrent chemoradiation with carboplatin for an AUC of 2 and paclitaxel 45 mg/m2. First dose expected on 03/28/20.   INTERVAL HISTORY: Scott Conley 79 y.o. male returns to the clinic today for a follow up visit. The patient is feeling fairly well today without any concerning complaints except for weight loss due to decreased appetite. The patient was recently found to have lung cancer. He had his staging PET scan which noted focal hypermetabolism in the colon as well as hypermetabolism in the esophagus which may be related to esophagitis, but cannot exclude neoplasm. He was referred to GI and has an appointment; however, he has a scheduling conflict with his appointment tiem. Otherwise, he is feeling fair. Denies fever, chills, or night sweats. He uses ensure 1-2 times per day. He denies chest pain or hemoptysis. Reports baseline shortness of breath and cough. Denies nausea, vomiting, or diarrhea. He has some constipation for which  he takes dulcolax. Denies headaches or visual changes. He denies any abnormal bleeding or bruising including melena, hematochezia, or hematemesis. He is here for evaluation before starting cycle #1.   MEDICAL HISTORY: Past Medical History:  Diagnosis Date  . Aortic atherosclerosis ([place])   . Arthritis   . Blood clot in vein   . CVA (cerebral infarction)   . Diabetes ([place])   . DVT (deep venous thrombosis) ([place])   . Emphysema lung ([place])   . Hypertension   . Lung cancer ([place])   . Osteoarthritis   . Stroke [facility])    Left hand stiffness     ALLERGIES:  has No Known Allergies.  MEDICATIONS:  Current Outpatient Medications  Medication Sig Dispense Refill  . arformoterol (BROVANA) 15 MCG/2ML NEBU Take 2 mLs (15 mcg total) by nebulization 2 (two) times daily. (Patient not taking: Reported on 03/20/2020) 120 mL 0  . aspirin EC 81 MG tablet Take 1 tablet (81 mg total) by mouth daily. Swallow whole. 360 tablet 0  . atorvastatin (LIPITOR) 80 MG tablet Take 1 tablet (80 mg total) by mouth daily. Must keep appt w/new provider for future refills 30 tablet 0  . bisacodyl (DULCOLAX) 10 MG suppository Place 1 suppository (10 mg total) rectally daily as needed for severe constipation. (Patient not taking: Reported on 03/20/2020) 12 suppository 0  . budesonide (PULMICORT) 0.25 MG/2ML nebulizer solution Take 2 mLs (0.25 mg total) by nebulization 2 (two) times daily. (Patient not taking: Reported on 03/20/2020) 180 each 0  . carvedilol (COREG) 6.25 MG tablet Take 1 tablet (6.25 mg total) by mouth 2 (two) times daily with a meal. 60 tablet 0  .  docusate sodium (COLACE) 100 MG capsule Take 200 mg by mouth 2 (two) times daily.    . polyethylene glycol (MIRALAX) 17 g packet Take 17 g by mouth daily as needed for moderate constipation. (Patient not taking: Reported on 03/20/2020) 14 each 0  . prochlorperazine (COMPAZINE) 10 MG tablet Take 1 tablet (10 mg total) by mouth every 6 (six) hours as needed for nausea or  vomiting. 30 tablet 0  . senna (SENOKOT) 8.6 MG TABS tablet Take 2 tablets (17.2 mg total) by mouth daily as needed for moderate constipation. (Patient not taking: Reported on 03/20/2020) 120 tablet 0  . sitaGLIPtin (JANUVIA) 100 MG tablet Take 1 tablet (100 mg total) by mouth daily. Must keep appt w/new provider for future refills 30 tablet 1   No current facility-administered medications for this visit.   Facility-Administered Medications Ordered in Other Visits  Medication Dose Route Frequency Provider Last Rate Last Admin  . 0.9 %  sodium chloride infusion   Intravenous Once Curt Bears, MD      . CARBOplatin (PARAPLATIN) 210 mg in sodium chloride 0.9 % 250 mL chemo infusion  210 mg Intravenous Once Curt Bears, MD      . dexamethasone (DECADRON) 20 mg in sodium chloride 0.9 % 50 mL IVPB  20 mg Intravenous Once Curt Bears, MD      . diphenhydrAMINE (BENADRYL) injection 50 mg  50 mg Intravenous Once Curt Bears, MD      . famotidine (PEPCID) IVPB 20 mg premix  20 mg Intravenous Once Curt Bears, MD      . PACLitaxel (TAXOL) 102 mg in sodium chloride 0.9 % 250 mL chemo infusion (</= 53m/m2)  45 mg/m2 (Treatment Plan Recorded) Intravenous Once MCurt Bears MD      . palonosetron (ALOXI) injection 0.25 mg  0.25 mg Intravenous Once MCurt Bears MD        SURGICAL HISTORY:  Past Surgical History:  Procedure Laterality Date  . BIOPSY  03/05/2020   Procedure: BIOPSY;  Surgeon: IGarner Nash DO;  [facility];  Service: Pulmonary;;  . CRYOTHERAPY  03/05/2020   Procedure: CRYOTHERAPY;  Surgeon: IGarner Nash DO;  [facility];  Service: Pulmonary;;  . CYST REMOVAL LEG     removed from right groin  . HEMOSTASIS CONTROL  03/05/2020   Procedure: HEMOSTASIS CONTROL;  Surgeon: IGarner Nash DO;  [facility];  Service: Pulmonary;;  . LOOP RECORDER IMPLANT  11/29/13   MDT LinQ implanted by Dr KCaryl Comesfor cryptogenic stroke  . LOOP  RECORDER IMPLANT N/A 11/29/2013   Procedure: LOOP RECORDER IMPLANT;  Surgeon: SDeboraha Sprang MD;  [facility];  Service: Cardiovascular;  Laterality: N/A;  . TEE WITHOUT CARDIOVERSION N/A 11/29/2013   Procedure: TRANSESOPHAGEAL ECHOCARDIOGRAM (TEE);  Surgeon: TSueanne Margarita MD;  [place]  Service: Cardiovascular;  Laterality: N/A;  . VIDEO BRONCHOSCOPY WITH ENDOBRONCHIAL ULTRASOUND N/A 03/05/2020   Procedure: VIDEO BRONCHOSCOPY WITH ENDOBRONCHIAL ULTRASOUND;  Surgeon: IGarner Nash DO;  [place] [facility]  Service: Pulmonary;  Laterality: N/A;    REVIEW OF SYSTEMS:   Review of Systems  Constitutional: Positive for decreased appetite and weight loss. Negative for chills and fever. HENT: Negative for mouth sores, nosebleeds, sore throat and trouble swallowing.   Eyes: Negative for eye problems and icterus.  Respiratory: Positive for shortness of breath with exertion and mild cough. Negative for hemoptysis and wheezing.   Cardiovascular: Negative for chest pain and leg swelling.  Gastrointestinal:  Positive for baseline constipation. Negative for abdominal pain, melena, hematochezia, diarrhea, nausea and vomiting.  Genitourinary: Negative for bladder incontinence, difficulty urinating, dysuria, frequency and hematuria.   Musculoskeletal: Negative for back pain, gait problem, neck pain and neck stiffness.  Skin: Negative for itching and rash.  Neurological: Negative for dizziness, extremity weakness, gait problem, headaches, light-headedness and seizures.  Hematological: Negative for adenopathy. Does not bruise/bleed easily.  Psychiatric/Behavioral: Negative for confusion, depression and sleep disturbance. The patient is not nervous/anxious.     PHYSICAL EXAMINATION:  Blood pressure 118/75, pulse 98, temperature 97.6 F (36.4 C), temperature source Tympanic, resp. rate 18, height _0  (1.93 m), weight 200 lb 11.2 oz (91 kg), SpO2 100 %.  ECOG PERFORMANCE STATUS:  2 - Symptomatic, <50% confined to bed  Physical Exam  Constitutional: Oriented to person, place, and time and well-developed, well-nourished, and in no distress.  HENT:  Head: Normocephalic and atraumatic.  Mouth/Throat: Oropharynx is clear and moist. No oropharyngeal exudate.  Eyes: Conjunctivae are normal. Right eye exhibits no discharge. Left eye exhibits no discharge. No scleral icterus.  Neck: Normal range of motion. Neck supple.  Cardiovascular: Normal rate, regular rhythm, normal heart sounds and intact distal pulses.   Pulmonary/Chest: Effort normal and breath sounds normal. No respiratory distress. No wheezes. No rales.  Abdominal: Soft. Bowel sounds are normal. Exhibits no distension and no mass. There is no tenderness.  Musculoskeletal: Normal range of motion. Exhibits no edema.  Lymphadenopathy:    No cervical adenopathy.  Neurological: Alert and oriented to person, place, and time. Exhibits muscle wasting. Examined in the wheelchair. Skin: Skin is warm and dry. No rash noted. Not diaphoretic. No erythema. No pallor.  Psychiatric: Mood, memory and judgment normal.  Vitals reviewed.  LABORATORY DATA: Lab Results  Component Value Date   WBC 12.7 (H) 03/28/2020   HGB 9.2 (L) 03/28/2020   HCT 30.4 (L) 03/28/2020   MCV 77.0 (L) 03/28/2020   PLT 351 03/28/2020      Chemistry      Component Value Date/Time   NA 137 03/28/2020 1033   K 3.8 03/28/2020 1033   CL 105 03/28/2020 1033   CO2 22 03/28/2020 1033   BUN 14 03/28/2020 1033   CREATININE 0.80 03/28/2020 1033      Component Value Date/Time   CALCIUM 9.4 03/28/2020 1033   ALKPHOS 70 03/28/2020 1033   AST 13 (L) 03/28/2020 1033   ALT 10 03/28/2020 1033   BILITOT 0.9 03/28/2020 1033       RADIOGRAPHIC STUDIES:  DG Chest 2 View  Result Date: 03/04/2020 CLINICAL DATA:  Dyspnea EXAM: CHEST - 2 VIEW COMPARISON:  11/27/2013 chest radiograph. FINDINGS: Loop recorder overlies the medial left lower chest. Stable  cardiomediastinal silhouette with normal heart size. No pneumothorax. No pleural effusion. New masslike opacity in the right infrahilar lung. No pulmonary edema. IMPRESSION: New masslike opacity in the right infrahilar lung, cannot exclude a lung mass. Recommend further evaluation with chest CT with IV contrast. Electronically Signed   By: Ilona Sorrel M.D.   On: 03/04/2020 09:35   CT Angio Chest PE W and/or Wo Contrast  Result Date: 03/04/2020 CLINICAL DATA:  Chest pain, shortness of breath. Concern for malignancy. History of DVT. EXAM: CT ANGIOGRAPHY CHEST WITH CONTRAST TECHNIQUE: Multidetector CT imaging of the chest was performed using the standard protocol during bolus administration of intravenous contrast. Multiplanar CT image reconstructions and MIPs were obtained to evaluate the vascular anatomy. CONTRAST:  61 ml OMNIPAQUE IOHEXOL 350  MG/ML SOLN COMPARISON:  Chest x-ray on 03/04/2020 FINDINGS: Cardiovascular: Heart size is normal. Coronary artery calcifications are present. There is atherosclerotic calcification of the thoracic aorta not associated with aneurysm. No pericardial effusion. The pulmonary arteries are well opacified by contrast bolus. There is no acute pulmonary embolus. RIGHT hilar mass encases and effaces the RIGHT LOWER lobe pulmonary artery branches. The RIGHT middle lobe pulmonary artery branch is truncated, best seen on image 159 of series 6. Effaced RIGHT LOWER lobe pulmonary veins. Mediastinum/Nodes: No significant mediastinal or LEFT hilar adenopathy. Confluent opacity in the RIGHT final precludes evaluation of individual lymph nodes. Axillary regions are unremarkable. The visualized portion of the thyroid gland has a normal appearance. Hiatal hernia. Otherwise the esophagus is normal. Lungs/Pleura: Centrilobular and paraseptal emphysematous changes are identified primarily within the UPPER lobes. There is a low-attenuation mass in the RIGHT hilar region measuring 8.7 x 6.6 x 6.0  centimeters. Mass is contiguous mass encircles the RIGHT LOWER lobe pulmonary artery branches and effaces them. There is complete occlusion of the bronchus intermedius by soft tissue mass. There is consolidations/drowned lung involving the RIGHT LOWER lobe. Upper Abdomen: Adrenal glands are normal. Gallbladder is present. The appearance of the liver is normal accounting for contrast bolus timing. Musculoskeletal: Mild midthoracic degenerative changes. No suspicious lytic or blastic lesions are identified. Review of the MIP images confirms the above findings. IMPRESSION: 1. No acute pulmonary embolus. 2. 8.7 x 6.6 centimeter RIGHT hilar mass encases the RIGHT LOWER lobe pulmonary artery branches and effaces them. 3. Occlusion of the RIGHT middle lobe pulmonary artery branch. 4. Occlusion of the bronchus intermedius by the mass. 5. Postobstructive consolidation/drowned lung involving the RIGHT LOWER lobe. 6. Hiatal hernia. 7. Aortic Atherosclerosis (ICD10-I70.0) and Emphysema (ICD10-J43.9). These results were called by telephone at the time of interpretation on 03/04/2020 at 2:42 pm to provider Dr. Pattricia Boss, who verbally acknowledged these results. Electronically Signed   By: Nolon Nations M.D.   On: 03/04/2020 14:43   MR Brain W Wo Contrast  Result Date: 03/22/2020 CLINICAL DATA:  Staging non-small cell lung cancer. EXAM: MRI HEAD WITHOUT AND WITH CONTRAST TECHNIQUE: Multiplanar, multiecho pulse sequences of the brain and surrounding structures were obtained without and with intravenous contrast. CONTRAST:  54m GADAVIST GADOBUTROL 1 MMOL/ML IV SOLN COMPARISON:  11/27/2013 FINDINGS: Brain: Diffusion imaging does not show any acute or subacute infarction. No focal abnormality affects the brainstem or cerebellum. Cerebral hemispheres show generalized atrophy with chronic small-vessel ischemic change of the hemispheric white matter. Old right frontoparietal vertex cortical and subcortical infarction. No evidence  of primary or metastatic mass lesion, hemorrhage, hydrocephalus or extra-axial collection. Vascular: Major vessels at the base of the brain show flow. Skull and upper cervical spine: Negative Sinuses/Orbits: Clear/normal Other: None IMPRESSION: 1. No evidence of metastatic disease. 2. Atrophy and chronic small-vessel ischemic changes as outlined above. Old right frontoparietal vertex cortical and subcortical infarction. Electronically Signed   By: MNelson ChimesM.D.   On: 03/22/2020 21:25   NM PET Image Initial (PI) Skull Base To Thigh  Addendum Date: 03/22/2020   ADDENDUM REPORT: 03/22/2020 14:10 ADDENDUM: As noted in the body of the report, there is hypermetabolism in the distal esophagus. This may be related to esophagitis, but consider upper endoscopy to exclude neoplasm. Electronically Signed   By: EMisty StanleyM.D.   On: 03/22/2020 14:10   Result Date: 03/22/2020 CLINICAL DATA:  Initial treatment strategy for non-small-cell lung cancer. EXAM: NUCLEAR MEDICINE PET SKULL BASE TO THIGH  TECHNIQUE: 10.4 mCi F-18 FDG was injected intravenously. Full-ring PET imaging was performed from the skull base to thigh after the radiotracer. CT data was obtained and used for attenuation correction and anatomic localization. Fasting blood glucose: 127 mg/dl COMPARISON:  Insert CT angio chest 03/04/2020 FINDINGS: Mediastinal blood pool activity: SUV max 3.3 Liver activity: SUV max NA NECK: Asymmetric uptake noted right inferior parotid gland without a discrete mass lesion evident. No lymphadenopathy in this region. Incidental CT findings: none CHEST: Right parahilar mass is hypermetabolic with central necrosis. SUV max = 23.3. Hypermetabolic activity extends into the right hilum. No hypermetabolic mediastinal or left hilar lymphadenopathy. Focal nodular pleural thickening in the posterior left lung apex (image 55/4). Nodular component to this thickening measures 1.6 x 1.4 cm. SUV max = 5.1 Hypermetabolism is identified in  the distal esophagus. Small to moderate hiatal hernia noted. Incidental CT findings: Coronary artery calcification is evident. Atherosclerotic calcification is noted in the wall of the thoracic aorta. Centrilobular emphsyema noted. ABDOMEN/PELVIS: No abnormal hypermetabolic activity within the liver, pancreas, adrenal glands, or spleen. No hypermetabolic lymph nodes in the abdomen or pelvis. There is marked focal hypermetabolism in the region of the ileocecal valve, potentially with mild intussusception in the terminal ileum. Although this is a noncontrast study, there is a suggestion of a soft tissue lesion in the cecum (see axial 158/4). As there are other areas of focal hypermetabolism in the colon, this may be physiologic. Incidental CT findings: There is abdominal aortic atherosclerosis without aneurysm. Right common iliac artery is aneurysmal up to 3.9 cm diameter. Left common iliac artery at the bifurcation is 1.7 cm diameter. Advanced diverticular disease noted in the left colon. SKELETON: No focal hypermetabolic activity to suggest skeletal metastasis. Incidental CT findings: none IMPRESSION: 1. Right parahilar mass lesion is markedly hypermetabolic consistent with known malignancy. 2. Hypermetabolic nodular pleural thickening posterior left lung apex. This could represent metastatic disease or a synchronous primary. 3. Marked focal hypermetabolism in the right colon, in the region the ileocecal valve. Noncontrast CT imaging for attenuation correction raises concern for a soft tissue lesion at this location. CT abdomen/pelvis with oral and intravenous contrast would likely prove helpful to further evaluate. There may be an associated short segment intussusception of the terminal ileum. 4. Right common iliac artery aneurysm measuring up to 3.9 cm diameter. Interventional Radiology consultation recommended. 5. Asymmetric uptake inferior right parotid region without discrete mass lesion or lymphadenopathy  evident. CT neck with contrast may prove helpful to assess for lesion not visible on noncontrast CT imaging today. 6.  Emphysema. (ICD10-J43.9) 7.  Aortic Atherosclerois (ICD10-170.0) Electronically Signed: By: Misty Stanley M.D. On: 03/22/2020 13:39     ASSESSMENT/PLAN:  This is a very pleasant 79 year old African American male with stage IIIB (T3, N2, M0) Non-Small Cell Lung Cancer, Adenocarcinoma. He presented with a right paratracheal mass, nodal pleural thickening posterior left lung apex. There was marked focal hypermetabolism in the right colon, in the region the ileocecal valve as well as distal esophagus which may be related to esophagitis but neoplasm cannot be excluded. He was diagnosed in August 2021. His PDL1 expression is 100% and he has no actionable mutations.   He is scheduled to begin concurrent chemoradiation with carboplatin for an AUC if 2 and paclitaxel 45 mg/m2 weekly. He is expected to receive his first treatment today.   The patient was seen with Dr. Julien Nordmann. Dr. Julien Nordmann personally and independently reviewed the patient's PET scan and discussed the results  with the patient. Dr. Julien Nordmann recommends proceeding with his current treatment as scheduled. Recommend he follow up with GI referral as well. He is unable to make his scheduled appointment for tomorrow at gastroenterology. He was given the number to call GI back to reschedule his appointment.   His labs show anemia. I will arrange for him to have iron studies and ferritin added to his weekly lab work next week. In the meantime, he was instructed to start taking ferrous sulfate 1 tablet daily.   We will see him back for a follow up visit in 2 weeks for evaluation before starting cycle #3.   I will place an referral to the nutritionist to discuss with the patient his weight loss and deceased appetite.   The patient was advised to call immediately if he has any concerning symptoms in the interval. The patient voices  understanding of current disease status and treatment options and is in agreement with the current care plan. All questions were answered. The patient knows to call the clinic with any problems, questions or concerns. We can certainly see the patient much sooner if necessary   No orders of the defined types were placed in this encounter.    Shirly Bartosiewicz L Imojean Yoshino, PA-C 03/28/20  ADDENDUM: Hematology/Oncology Attending: I had a face-to-face encounter with the patient today.  I recommended his care plan.  This is a pleasant 79 years old African-American male recently diagnosed with a stage IIIb (T3, N2, M0) non-small cell lung cancer, adenocarcinoma with no actionable mutations and PD-L1 expression of 100%. The patient had a PET scan performed recently.  I personally and independently reviewed the PET scan images and discussed the results with the patient today. He also has molecular studies and this was discussed with the patient. I recommended for the patient to proceed with a course of concurrent chemoradiation with weekly carboplatin and paclitaxel as previously scheduled and he is expected to start the first dose of this treatment today. He will come back for follow-up visit in 2 weeks for evaluation and management of any adverse effect of his treatment. For the microcytic anemia, we will check his iron study and ferritin but we started the patient empirically on ferrous sulfate 1 tablet daily. For the suspicious lesion in the right colon, the patient has an appointment with gastroenterology tomorrow but he will call them to reschedule the appointment to next week because he is worried about transportation issues. The patient was advised to call immediately if he has any concerning symptoms in the interval.  Disclaimer: This note was dictated with voice recognition software. Similar sounding words can inadvertently be transcribed and may be missed upon review. Scott Kempf,  MD 03/28/20

## 2020-03-25 DIAGNOSIS — Z510 Encounter for antineoplastic radiation therapy: Secondary | ICD-10-CM | POA: Diagnosis not present

## 2020-03-25 DIAGNOSIS — C3411 Malignant neoplasm of upper lobe, right bronchus or lung: Secondary | ICD-10-CM | POA: Diagnosis not present

## 2020-03-25 DIAGNOSIS — C3431 Malignant neoplasm of lower lobe, right bronchus or lung: Secondary | ICD-10-CM | POA: Diagnosis not present

## 2020-03-27 ENCOUNTER — Telehealth: Payer: Self-pay | Admitting: Radiation Oncology

## 2020-03-27 ENCOUNTER — Encounter: Payer: Self-pay | Admitting: *Deleted

## 2020-03-27 ENCOUNTER — Telehealth: Payer: Self-pay | Admitting: *Deleted

## 2020-03-27 ENCOUNTER — Ambulatory Visit
Admission: RE | Admit: 2020-03-27 | Discharge: 2020-03-27 | Disposition: A | Discharge status: Home / Self Care | Payer: Medicare HMO | Source: Ambulatory Visit | Attending: Radiation Oncology | Admitting: Radiation Oncology

## 2020-03-27 ENCOUNTER — Other Ambulatory Visit: Payer: Self-pay

## 2020-03-27 ENCOUNTER — Encounter (HOSPITAL_COMMUNITY): Payer: Self-pay | Admitting: Internal Medicine

## 2020-03-27 DIAGNOSIS — K449 Diaphragmatic hernia without obstruction or gangrene: Secondary | ICD-10-CM | POA: Diagnosis not present

## 2020-03-27 DIAGNOSIS — J439 Emphysema, unspecified: Secondary | ICD-10-CM | POA: Diagnosis not present

## 2020-03-27 DIAGNOSIS — C3431 Malignant neoplasm of lower lobe, right bronchus or lung: Secondary | ICD-10-CM | POA: Diagnosis not present

## 2020-03-27 DIAGNOSIS — Z510 Encounter for antineoplastic radiation therapy: Secondary | ICD-10-CM | POA: Diagnosis not present

## 2020-03-27 DIAGNOSIS — Z5111 Encounter for antineoplastic chemotherapy: Secondary | ICD-10-CM | POA: Diagnosis not present

## 2020-03-27 DIAGNOSIS — C3411 Malignant neoplasm of upper lobe, right bronchus or lung: Secondary | ICD-10-CM | POA: Diagnosis not present

## 2020-03-27 DIAGNOSIS — D649 Anemia, unspecified: Secondary | ICD-10-CM | POA: Diagnosis not present

## 2020-03-27 DIAGNOSIS — M1990 Unspecified osteoarthritis, unspecified site: Secondary | ICD-10-CM | POA: Diagnosis not present

## 2020-03-27 DIAGNOSIS — I723 Aneurysm of iliac artery: Secondary | ICD-10-CM | POA: Diagnosis not present

## 2020-03-27 DIAGNOSIS — I6782 Cerebral ischemia: Secondary | ICD-10-CM | POA: Diagnosis not present

## 2020-03-27 DIAGNOSIS — I700 Atherosclerosis of aorta: Secondary | ICD-10-CM | POA: Diagnosis not present

## 2020-03-27 DIAGNOSIS — D509 Iron deficiency anemia, unspecified: Secondary | ICD-10-CM | POA: Diagnosis not present

## 2020-03-27 DIAGNOSIS — E119 Type 2 diabetes mellitus without complications: Secondary | ICD-10-CM | POA: Diagnosis not present

## 2020-03-27 NOTE — Telephone Encounter (Signed)
I spoke with the patient's daughter Scott Conley, and she is on the same page with our discussion about plans for treatment. He will start XRT today, and chemo tomorrow. Dr. Lisbeth Renshaw plans to follow the left lung and could offer SBRT at a later time, but we reviewed the rationale to proceed with GI evaluation and she is in agreement with this plan as well.

## 2020-03-27 NOTE — Progress Notes (Signed)
Pharmacist Chemotherapy Monitoring - Initial Assessment    Anticipated start date: 03/28/20   Regimen:  . Are orders appropriate based on the patient's diagnosis, regimen, and cycle? Yes . Does the plan date match the patient's scheduled date? Yes . Is the sequencing of drugs appropriate? Yes . Are the premedications appropriate for the patient's regimen? Yes . Prior Authorization for treatment is: Approved o If applicable, is the correct biosimilar selected based on the patient's insurance? not applicable  Organ Function and Labs: Marland Kitchen Are dose adjustments needed based on the patient's renal function, hepatic function, or hematologic function? Yes . Are appropriate labs ordered prior to the start of patient's treatment? Yes . Other organ system assessment, if indicated: N/A . The following baseline labs, if indicated, have been ordered: N/A  Dose Assessment: . Are the drug doses appropriate? Yes . Are the following correct: o Drug concentrations Yes o IV fluid compatible with drug Yes o Administration routes Yes o Timing of therapy Yes . If applicable, does the patient have documented access for treatment and/or plans for port-a-cath placement? no . If applicable, have lifetime cumulative doses been properly documented and assessed? not applicable Lifetime Dose Tracking  No doses have been documented on this patient for the following tracked chemicals: Doxorubicin, Epirubicin, Idarubicin, Daunorubicin, Mitoxantrone, Bleomycin, Oxaliplatin, Carboplatin, Liposomal Doxorubicin  o   Toxicity Monitoring/Prevention: . The patient has the following take home antiemetics prescribed: Prochlorperazine . The patient has the following take home medications prescribed: N/A . Medication allergies and previous infusion related reactions, if applicable, have been reviewed and addressed. Yes . The patient's current medication list has been assessed for drug-drug interactions with their chemotherapy  regimen. no significant drug-drug interactions were identified on review.  Order Review: . Are the treatment plan orders signed? Yes . Is the patient scheduled to see a provider prior to their treatment? Yes  I verify that I have reviewed each item in the above checklist and answered each question accordingly.  Romualdo Bolk [facility] 03/27/2020 11:10 AM

## 2020-03-27 NOTE — Telephone Encounter (Signed)
Called patient's daughter- [name] to inform of [facility] appt. with Dr. Raquel James on 03-29-20 - arrival time- 1:40 pm, spoke with patient's daughter [name] and she is aware of this appt.

## 2020-03-27 NOTE — Telephone Encounter (Signed)
I called the patient to let him know that his pet scan shows activity in the GI tract as well as in some other sites that are not included in his current treatment plan that starts today. I left him a message to call me back so we could discuss further, and placed an urgent referral to GI.

## 2020-03-27 NOTE — Telephone Encounter (Signed)
The patient called me back and we were able to discuss his findings on PET, need for GI evaluation, and possibly more treatment to his left lung. His case will also be added to thoracic oncology conference this week. He describes poor appetite and states nothing tastes good. He does not describe symptoms of regurgitation or dysphagia. He reports he is drinking boost and ensure frequently. He denies blood in his stool, but has some days where he does not go even though he thinks he needs to have a BM. He describes occasional upper abdominal pain, not worsened or improved by any particular activity, but seems to be self limiting.

## 2020-03-28 ENCOUNTER — Encounter: Payer: Self-pay | Admitting: Physician Assistant

## 2020-03-28 ENCOUNTER — Ambulatory Visit
Admission: RE | Admit: 2020-03-28 | Discharge: 2020-03-28 | Disposition: A | Discharge status: Home / Self Care | Payer: Medicare HMO | Source: Ambulatory Visit | Attending: Radiation Oncology | Admitting: Radiation Oncology

## 2020-03-28 ENCOUNTER — Inpatient Hospital Stay (HOSPITAL_BASED_OUTPATIENT_CLINIC_OR_DEPARTMENT_OTHER): Payer: Medicare HMO | Admitting: Physician Assistant

## 2020-03-28 ENCOUNTER — Inpatient Hospital Stay: Payer: Medicare HMO

## 2020-03-28 ENCOUNTER — Other Ambulatory Visit: Payer: Self-pay

## 2020-03-28 VITALS — BP 137/73 | HR 76 | Temp 97.7°F | Resp 18

## 2020-03-28 VITALS — BP 118/75 | HR 98 | Temp 97.6°F | Resp 18 | Ht 76.0 in | Wt 200.7 lb

## 2020-03-28 DIAGNOSIS — I700 Atherosclerosis of aorta: Secondary | ICD-10-CM | POA: Diagnosis not present

## 2020-03-28 DIAGNOSIS — C3431 Malignant neoplasm of lower lobe, right bronchus or lung: Secondary | ICD-10-CM

## 2020-03-28 DIAGNOSIS — D649 Anemia, unspecified: Secondary | ICD-10-CM

## 2020-03-28 DIAGNOSIS — D509 Iron deficiency anemia, unspecified: Secondary | ICD-10-CM | POA: Diagnosis not present

## 2020-03-28 DIAGNOSIS — Z510 Encounter for antineoplastic radiation therapy: Secondary | ICD-10-CM | POA: Diagnosis not present

## 2020-03-28 DIAGNOSIS — J439 Emphysema, unspecified: Secondary | ICD-10-CM | POA: Diagnosis not present

## 2020-03-28 DIAGNOSIS — M1990 Unspecified osteoarthritis, unspecified site: Secondary | ICD-10-CM | POA: Diagnosis not present

## 2020-03-28 DIAGNOSIS — C3411 Malignant neoplasm of upper lobe, right bronchus or lung: Secondary | ICD-10-CM | POA: Diagnosis not present

## 2020-03-28 DIAGNOSIS — K449 Diaphragmatic hernia without obstruction or gangrene: Secondary | ICD-10-CM | POA: Diagnosis not present

## 2020-03-28 DIAGNOSIS — Z5111 Encounter for antineoplastic chemotherapy: Secondary | ICD-10-CM

## 2020-03-28 DIAGNOSIS — I723 Aneurysm of iliac artery: Secondary | ICD-10-CM | POA: Diagnosis not present

## 2020-03-28 DIAGNOSIS — E119 Type 2 diabetes mellitus without complications: Secondary | ICD-10-CM | POA: Diagnosis not present

## 2020-03-28 DIAGNOSIS — I6782 Cerebral ischemia: Secondary | ICD-10-CM | POA: Diagnosis not present

## 2020-03-28 LAB — CBC WITH DIFFERENTIAL (CANCER CENTER ONLY)
Abs Immature Granulocytes: 0.05 10*3/uL (ref 0.00–0.07)
Basophils Absolute: 0 10*3/uL (ref 0.0–0.1)
Basophils Relative: 0 %
Eosinophils Absolute: 0.3 10*3/uL (ref 0.0–0.5)
Eosinophils Relative: 2 %
HCT: 30.4 % — ABNORMAL LOW (ref 39.0–52.0)
Hemoglobin: 9.2 g/dL — ABNORMAL LOW (ref 13.0–17.0)
Immature Granulocytes: 0 %
Lymphocytes Relative: 15 %
Lymphs Abs: 1.9 10*3/uL (ref 0.7–4.0)
MCH: 23.3 pg — ABNORMAL LOW (ref 26.0–34.0)
MCHC: 30.3 g/dL (ref 30.0–36.0)
MCV: 77 fL — ABNORMAL LOW (ref 80.0–100.0)
Monocytes Absolute: 0.9 10*3/uL (ref 0.1–1.0)
Monocytes Relative: 7 %
Neutro Abs: 9.5 10*3/uL — ABNORMAL HIGH (ref 1.7–7.7)
Neutrophils Relative %: 76 %
Platelet Count: 351 10*3/uL (ref 150–400)
RBC: 3.95 MIL/uL — ABNORMAL LOW (ref 4.22–5.81)
RDW: 20.6 % — ABNORMAL HIGH (ref 11.5–15.5)
WBC Count: 12.7 10*3/uL — ABNORMAL HIGH (ref 4.0–10.5)
nRBC: 0 % (ref 0.0–0.2)

## 2020-03-28 LAB — CMP (CANCER CENTER ONLY)
ALT: 10 U/L (ref 0–44)
AST: 13 U/L — ABNORMAL LOW (ref 15–41)
Albumin: 2.3 g/dL — ABNORMAL LOW (ref 3.5–5.0)
Alkaline Phosphatase: 70 U/L (ref 38–126)
Anion gap: 10 (ref 5–15)
BUN: 14 mg/dL (ref 8–23)
CO2: 22 mmol/L (ref 22–32)
Calcium: 9.4 mg/dL (ref 8.9–10.3)
Chloride: 105 mmol/L (ref 98–111)
Creatinine: 0.8 mg/dL (ref 0.61–1.24)
GFR, Est AFR Am: >60 mL/min (ref >60)
GFR, Estimated: >60 mL/min (ref >60)
Glucose, Bld: 118 mg/dL — ABNORMAL HIGH (ref 70–99)
Potassium: 3.8 mmol/L (ref 3.5–5.1)
Sodium: 137 mmol/L (ref 135–145)
Total Bilirubin: 0.9 mg/dL (ref 0.3–1.2)
Total Protein: 7.3 g/dL (ref 6.5–8.1)

## 2020-03-28 MED ORDER — DIPHENHYDRAMINE HCL 50 MG/ML IJ SOLN
INTRAMUSCULAR | Status: AC
  Filled 2020-03-28: qty 1

## 2020-03-28 MED ORDER — SODIUM CHLORIDE 0.9 % IV SOLN
205.6000 mg | Freq: Once | INTRAVENOUS | Status: AC
  Administered 2020-03-28: 210 mg via INTRAVENOUS
  Filled 2020-03-28: qty 21

## 2020-03-28 MED ORDER — DIPHENHYDRAMINE HCL 50 MG/ML IJ SOLN
50.0000 mg | Freq: Once | INTRAMUSCULAR | Status: AC
  Administered 2020-03-28: 50 mg via INTRAVENOUS

## 2020-03-28 MED ORDER — SODIUM CHLORIDE 0.9 % IV SOLN
Freq: Once | INTRAVENOUS | Status: AC
  Filled 2020-03-28: qty 250

## 2020-03-28 MED ORDER — FAMOTIDINE IN NACL 20-0.9 MG/50ML-% IV SOLN
INTRAVENOUS | Status: AC
  Filled 2020-03-28: qty 50

## 2020-03-28 MED ORDER — SODIUM CHLORIDE 0.9 % IV SOLN
20.0000 mg | Freq: Once | INTRAVENOUS | Status: AC
  Administered 2020-03-28: 20 mg via INTRAVENOUS
  Filled 2020-03-28: qty 20

## 2020-03-28 MED ORDER — SODIUM CHLORIDE 0.9 % IV SOLN
45.0000 mg/m2 | Freq: Once | INTRAVENOUS | Status: AC
  Administered 2020-03-28: 102 mg via INTRAVENOUS
  Filled 2020-03-28: qty 17

## 2020-03-28 MED ORDER — PALONOSETRON HCL INJECTION 0.25 MG/5ML
INTRAVENOUS | Status: AC
  Filled 2020-03-28: qty 5

## 2020-03-28 MED ORDER — PALONOSETRON HCL INJECTION 0.25 MG/5ML
0.2500 mg | Freq: Once | INTRAVENOUS | Status: AC
  Administered 2020-03-28: 0.25 mg via INTRAVENOUS

## 2020-03-28 MED ORDER — FAMOTIDINE IN NACL 20-0.9 MG/50ML-% IV SOLN
20.0000 mg | Freq: Once | INTRAVENOUS | Status: AC
  Administered 2020-03-28: 20 mg via INTRAVENOUS

## 2020-03-28 NOTE — Patient Instructions (Signed)
Paclitaxel injection What is this medicine? PACLITAXEL (PAK li TAX el) is a chemotherapy drug. It targets fast dividing cells, like cancer cells, and causes these cells to die. This medicine is used to treat ovarian cancer, breast cancer, lung cancer, Kaposi's sarcoma, and other cancers. This medicine may be used for other purposes; ask your health care provider or pharmacist if you have questions. COMMON BRAND NAME(S): Onxol, Taxol What should I tell my health care provider before I take this medicine? They need to know if you have any of these conditions:  history of irregular heartbeat  liver disease  low blood counts, like low white cell, platelet, or red cell counts  lung or breathing disease, like asthma  tingling of the fingers or toes, or other nerve disorder  an unusual or allergic reaction to paclitaxel, alcohol, polyoxyethylated castor oil, other chemotherapy, other medicines, foods, dyes, or preservatives  pregnant or trying to get pregnant  breast-feeding How should I use this medicine? This drug is given as an infusion into a vein. It is administered in a hospital or clinic by a specially trained health care professional. Talk to your pediatrician regarding the use of this medicine in children. Special care may be needed. Overdosage: If you think you have taken too much of this medicine contact a poison control center or emergency room at once. NOTE: This medicine is only for you. Do not share this medicine with others. What if I miss a dose? It is important not to miss your dose. Call your doctor or health care professional if you are unable to keep an appointment. What may interact with this medicine? Do not take this medicine with any of the following medications:  disulfiram  metronidazole This medicine may also interact with the following medications:  antiviral medicines for hepatitis, HIV or AIDS  certain antibiotics like erythromycin and  clarithromycin  certain medicines for fungal infections like ketoconazole and itraconazole  certain medicines for seizures like carbamazepine, phenobarbital, phenytoin  gemfibrozil  nefazodone  rifampin  St. John's wort This list may not describe all possible interactions. Give your health care provider a list of all the medicines, herbs, non-prescription drugs, or dietary supplements you use. Also tell them if you smoke, drink alcohol, or use illegal drugs. Some items may interact with your medicine. What should I watch for while using this medicine? Your condition will be monitored carefully while you are receiving this medicine. You will need important blood work done while you are taking this medicine. This medicine can cause serious allergic reactions. To reduce your risk you will need to take other medicine(s) before treatment with this medicine. If you experience allergic reactions like skin rash, itching or hives, swelling of the face, lips, or tongue, tell your doctor or health care professional right away. In some cases, you may be given additional medicines to help with side effects. Follow all directions for their use. This drug may make you feel generally unwell. This is not uncommon, as chemotherapy can affect healthy cells as well as cancer cells. Report any side effects. Continue your course of treatment even though you feel ill unless your doctor tells you to stop. Call your doctor or health care professional for advice if you get a fever, chills or sore throat, or other symptoms of a cold or flu. Do not treat yourself. This drug decreases your body's ability to fight infections. Try to avoid being around people who are sick. This medicine may increase your risk to bruise  or bleed. Call your doctor or health care professional if you notice any unusual bleeding. Be careful brushing and flossing your teeth or using a toothpick because you may get an infection or bleed more easily.  If you have any dental work done, tell your dentist you are receiving this medicine. Avoid taking products that contain aspirin, acetaminophen, ibuprofen, naproxen, or ketoprofen unless instructed by your doctor. These medicines may hide a fever. Do not become pregnant while taking this medicine. Women should inform their doctor if they wish to become pregnant or think they might be pregnant. There is a potential for serious side effects to an unborn child. Talk to your health care professional or pharmacist for more information. Do not breast-feed an infant while taking this medicine. Men are advised not to father a child while receiving this medicine. This product may contain alcohol. Ask your pharmacist or healthcare provider if this medicine contains alcohol. Be sure to tell all healthcare providers you are taking this medicine. Certain medicines, like metronidazole and disulfiram, can cause an unpleasant reaction when taken with alcohol. The reaction includes flushing, headache, nausea, vomiting, sweating, and increased thirst. The reaction can last from 30 minutes to several hours. What side effects may I notice from receiving this medicine? Side effects that you should report to your doctor or health care professional as soon as possible:  allergic reactions like skin rash, itching or hives, swelling of the face, lips, or tongue  breathing problems  changes in vision  fast, irregular heartbeat  high or low blood pressure  mouth sores  pain, tingling, numbness in the hands or feet  signs of decreased platelets or bleeding - bruising, pinpoint red spots on the skin, black, tarry stools, blood in the urine  signs of decreased red blood cells - unusually weak or tired, feeling faint or lightheaded, falls  signs of infection - fever or chills, cough, sore throat, pain or difficulty passing urine  signs and symptoms of liver injury like dark yellow or brown urine; general ill feeling or  flu-like symptoms; light-colored stools; loss of appetite; nausea; right upper belly pain; unusually weak or tired; yellowing of the eyes or skin  swelling of the ankles, feet, hands  unusually slow heartbeat Side effects that usually do not require medical attention (report to your doctor or health care professional if they continue or are bothersome):  diarrhea  hair loss  loss of appetite  muscle or joint pain  nausea, vomiting  pain, redness, or irritation at site where injected  tiredness This list may not describe all possible side effects. Call your doctor for medical advice about side effects. You may report side effects to FDA at 1-800-FDA-1088. Where should I keep my medicine? This drug is given in a hospital or clinic and will not be stored at home. NOTE: This sheet is a summary. It may not cover all possible information. If you have questions about this medicine, talk to your doctor, pharmacist, or health care provider.  2020 Elsevier/Gold Standard (2017-03-18 13:14:55)  Carboplatin injection What is this medicine? CARBOPLATIN (KAR boe pla tin) is a chemotherapy drug. It targets fast dividing cells, like cancer cells, and causes these cells to die. This medicine is used to treat ovarian cancer and many other cancers. This medicine may be used for other purposes; ask your health care provider or pharmacist if you have questions. COMMON BRAND NAME(S): Paraplatin What should I tell my health care provider before I take this medicine? They need  to know if you have any of these conditions:  blood disorders  hearing problems  kidney disease  recent or ongoing radiation therapy  an unusual or allergic reaction to carboplatin, cisplatin, other chemotherapy, other medicines, foods, dyes, or preservatives  pregnant or trying to get pregnant  breast-feeding How should I use this medicine? This drug is usually given as an infusion into a vein. It is administered in a  hospital or clinic by a specially trained health care professional. Talk to your pediatrician regarding the use of this medicine in children. Special care may be needed. Overdosage: If you think you have taken too much of this medicine contact a poison control center or emergency room at once. NOTE: This medicine is only for you. Do not share this medicine with others. What if I miss a dose? It is important not to miss a dose. Call your doctor or health care professional if you are unable to keep an appointment. What may interact with this medicine?  medicines for seizures  medicines to increase blood counts like filgrastim, pegfilgrastim, sargramostim  some antibiotics like amikacin, gentamicin, neomycin, streptomycin, tobramycin  vaccines Talk to your doctor or health care professional before taking any of these medicines:  acetaminophen  aspirin  ibuprofen  ketoprofen  naproxen This list may not describe all possible interactions. Give your health care provider a list of all the medicines, herbs, non-prescription drugs, or dietary supplements you use. Also tell them if you smoke, drink alcohol, or use illegal drugs. Some items may interact with your medicine. What should I watch for while using this medicine? Your condition will be monitored carefully while you are receiving this medicine. You will need important blood work done while you are taking this medicine. This drug may make you feel generally unwell. This is not uncommon, as chemotherapy can affect healthy cells as well as cancer cells. Report any side effects. Continue your course of treatment even though you feel ill unless your doctor tells you to stop. In some cases, you may be given additional medicines to help with side effects. Follow all directions for their use. Call your doctor or health care professional for advice if you get a fever, chills or sore throat, or other symptoms of a cold or flu. Do not treat  yourself. This drug decreases your body's ability to fight infections. Try to avoid being around people who are sick. This medicine may increase your risk to bruise or bleed. Call your doctor or health care professional if you notice any unusual bleeding. Be careful brushing and flossing your teeth or using a toothpick because you may get an infection or bleed more easily. If you have any dental work done, tell your dentist you are receiving this medicine. Avoid taking products that contain aspirin, acetaminophen, ibuprofen, naproxen, or ketoprofen unless instructed by your doctor. These medicines may hide a fever. Do not become pregnant while taking this medicine. Women should inform their doctor if they wish to become pregnant or think they might be pregnant. There is a potential for serious side effects to an unborn child. Talk to your health care professional or pharmacist for more information. Do not breast-feed an infant while taking this medicine. What side effects may I notice from receiving this medicine? Side effects that you should report to your doctor or health care professional as soon as possible:  allergic reactions like skin rash, itching or hives, swelling of the face, lips, or tongue  signs of infection - fever  or chills, cough, sore throat, pain or difficulty passing urine  signs of decreased platelets or bleeding - bruising, pinpoint red spots on the skin, black, tarry stools, nosebleeds  signs of decreased red blood cells - unusually weak or tired, fainting spells, lightheadedness  breathing problems  changes in hearing  changes in vision  chest pain  high blood pressure  low blood counts - This drug may decrease the number of white blood cells, red blood cells and platelets. You may be at increased risk for infections and bleeding.  nausea and vomiting  pain, swelling, redness or irritation at the injection site  pain, tingling, numbness in the hands or  feet  problems with balance, talking, walking  trouble passing urine or change in the amount of urine Side effects that usually do not require medical attention (report to your doctor or health care professional if they continue or are bothersome):  hair loss  loss of appetite  metallic taste in the mouth or changes in taste This list may not describe all possible side effects. Call your doctor for medical advice about side effects. You may report side effects to FDA at 1-800-FDA-1088. Where should I keep my medicine? This drug is given in a hospital or clinic and will not be stored at home. NOTE: This sheet is a summary. It may not cover all possible information. If you have questions about this medicine, talk to your doctor, pharmacist, or health care provider.  2020 Elsevier/Gold Standard (2007-10-20 14:38:05)

## 2020-03-29 ENCOUNTER — Ambulatory Visit: Payer: Medicare HMO | Admitting: Internal Medicine

## 2020-03-29 ENCOUNTER — Other Ambulatory Visit: Payer: Self-pay

## 2020-03-29 ENCOUNTER — Ambulatory Visit
Admission: RE | Admit: 2020-03-29 | Discharge: 2020-03-29 | Disposition: A | Discharge status: Home / Self Care | Payer: Medicare HMO | Source: Ambulatory Visit | Attending: Radiation Oncology | Admitting: Radiation Oncology

## 2020-03-29 DIAGNOSIS — C3411 Malignant neoplasm of upper lobe, right bronchus or lung: Secondary | ICD-10-CM | POA: Insufficient documentation

## 2020-03-29 DIAGNOSIS — Z510 Encounter for antineoplastic radiation therapy: Secondary | ICD-10-CM | POA: Diagnosis not present

## 2020-03-29 DIAGNOSIS — C3431 Malignant neoplasm of lower lobe, right bronchus or lung: Secondary | ICD-10-CM | POA: Diagnosis not present

## 2020-03-30 ENCOUNTER — Ambulatory Visit
Admission: RE | Admit: 2020-03-30 | Discharge: 2020-03-30 | Disposition: A | Discharge status: Home / Self Care | Payer: Medicare HMO | Source: Ambulatory Visit | Attending: Radiation Oncology | Admitting: Radiation Oncology

## 2020-03-30 ENCOUNTER — Telehealth: Payer: Self-pay | Admitting: Radiation Oncology

## 2020-03-30 ENCOUNTER — Encounter (HOSPITAL_COMMUNITY): Payer: Self-pay | Admitting: Radiology

## 2020-03-30 ENCOUNTER — Other Ambulatory Visit: Payer: Self-pay

## 2020-03-30 DIAGNOSIS — C3411 Malignant neoplasm of upper lobe, right bronchus or lung: Secondary | ICD-10-CM | POA: Diagnosis not present

## 2020-03-30 DIAGNOSIS — Z510 Encounter for antineoplastic radiation therapy: Secondary | ICD-10-CM | POA: Diagnosis not present

## 2020-03-30 DIAGNOSIS — C3431 Malignant neoplasm of lower lobe, right bronchus or lung: Secondary | ICD-10-CM | POA: Diagnosis not present

## 2020-03-30 NOTE — Progress Notes (Signed)
Scott Conley. Scott Conley Male, 79 y.o., 08-14-1940 MRN:  [id number] Phone:  [phone] (H) PCP:  Golden Circle, FNP Coverage:  [facility] Medicare/[employer] Medicare Hmo Next Appt With Radiation Oncology 03/31/2020 at 10:00 AM  RE: CT Biopsy Received: Today Sandi Mariscal, MD  Garth Bigness D; P Ir Procedure Requests CT guided L upper lobe pulmonary nodule Bx - PET CT image 13, series 8.   Please schedule at [facility], preferably when I'm at [facility] on Thursday, 9/9   Discussed at Tx conference.   Difficult lesion and may NOT be amendable but patient is receiving a LOT of XRT to the R hilum and they don't really want to have to radiate the left apex if they don't have to which is whey they are asking for an attempted Bx but realize it is difficult and may not be possible.   Cathren Harsh       Previous Messages   ----- Message -----  From: Garth Bigness D  Sent: 03/30/2020  4:32 PM EDT  To: Sandi Mariscal, MD, Ir Procedure Requests  Subject: CT Biopsy                     Procedure: CT Biopsy   Reason: Malignant neoplasm of bronchus of right lower lobe, left lower lobe,  Left lung base. Was approved on 03/30/20 in thoracic conference (I think it was Dr. Pascal Lux who was there). Please schedule with daughter [name].   History: CT, NM PET, MR in computer   Provider: Hayden Pedro   Provider Contact: [phone]

## 2020-03-30 NOTE — Telephone Encounter (Signed)
I called the patient and his daughter [name] and we discussed his PET scan findings again and that in thoracic conference today it was determined that to attempt to biopsy the left lung base lesion corresponding to the PET finding would help determine treatment goals and clarify findings seen as there are multiple areas of concern. He was not able to see Dr. Hilarie Fredrickson yesterday due to transportation but was rescheduled in November. I will reach out to Dr. Hilarie Fredrickson to see if he can be seen sooner, perhaps by a different provider if Dr. Hilarie Fredrickson can't see him sooner. An order for biopsy of the left lung base was placed. For now he will otherwise continue his current regimen of chemoRT.    Carola Rhine, PAC

## 2020-03-31 ENCOUNTER — Ambulatory Visit: Payer: Medicare HMO

## 2020-03-31 ENCOUNTER — Telehealth: Payer: Self-pay | Admitting: *Deleted

## 2020-03-31 NOTE — Telephone Encounter (Signed)
Yes, we will try to get him in. I was surprised when he no-showed earlier this week Dottie, this man needs an OV with me, APP or 1st available provider Thanks JMP

## 2020-03-31 NOTE — Telephone Encounter (Signed)
Patient to have CT Biopsy on 04-13-20 @ 11 am @ [facility]

## 2020-03-31 NOTE — Telephone Encounter (Signed)
I have left a message on patient's home, mobile and daughter's Jeannene Patella) phone to call back. He has been scheduled to see Dr Ardis Hughs on 04/05/20 at 2:30pm.

## 2020-04-01 ENCOUNTER — Ambulatory Visit (HOSPITAL_COMMUNITY): Payer: Medicare HMO

## 2020-04-04 ENCOUNTER — Inpatient Hospital Stay: Payer: Medicare HMO

## 2020-04-04 ENCOUNTER — Inpatient Hospital Stay: Payer: Medicare HMO | Attending: Physician Assistant

## 2020-04-04 ENCOUNTER — Ambulatory Visit (HOSPITAL_COMMUNITY): Payer: Medicare HMO

## 2020-04-04 ENCOUNTER — Ambulatory Visit
Admission: RE | Admit: 2020-04-04 | Discharge: 2020-04-04 | Disposition: A | Discharge status: Home / Self Care | Payer: Medicare HMO | Source: Ambulatory Visit | Attending: Radiation Oncology | Admitting: Radiation Oncology

## 2020-04-04 ENCOUNTER — Other Ambulatory Visit: Payer: Self-pay

## 2020-04-04 VITALS — BP 127/88 | HR 100 | Temp 97.6°F | Resp 18 | Wt 184.5 lb

## 2020-04-04 DIAGNOSIS — Z79899 Other long term (current) drug therapy: Secondary | ICD-10-CM | POA: Insufficient documentation

## 2020-04-04 DIAGNOSIS — C3411 Malignant neoplasm of upper lobe, right bronchus or lung: Secondary | ICD-10-CM | POA: Diagnosis not present

## 2020-04-04 DIAGNOSIS — D649 Anemia, unspecified: Secondary | ICD-10-CM | POA: Insufficient documentation

## 2020-04-04 DIAGNOSIS — C3431 Malignant neoplasm of lower lobe, right bronchus or lung: Secondary | ICD-10-CM

## 2020-04-04 DIAGNOSIS — Z5111 Encounter for antineoplastic chemotherapy: Secondary | ICD-10-CM | POA: Insufficient documentation

## 2020-04-04 DIAGNOSIS — Z510 Encounter for antineoplastic radiation therapy: Secondary | ICD-10-CM | POA: Diagnosis not present

## 2020-04-04 LAB — CMP (CANCER CENTER ONLY)
ALT: 12 U/L (ref 0–44)
AST: 9 U/L — ABNORMAL LOW (ref 15–41)
Albumin: 2.2 g/dL — ABNORMAL LOW (ref 3.5–5.0)
Alkaline Phosphatase: 65 U/L (ref 38–126)
Anion gap: 10 (ref 5–15)
BUN: 10 mg/dL (ref 8–23)
CO2: 22 mmol/L (ref 22–32)
Calcium: 8.8 mg/dL — ABNORMAL LOW (ref 8.9–10.3)
Chloride: 103 mmol/L (ref 98–111)
Creatinine: 0.77 mg/dL (ref 0.61–1.24)
GFR, Est AFR Am: >60 mL/min (ref >60)
GFR, Estimated: >60 mL/min (ref >60)
Glucose, Bld: 134 mg/dL — ABNORMAL HIGH (ref 70–99)
Potassium: 4.1 mmol/L (ref 3.5–5.1)
Sodium: 135 mmol/L (ref 135–145)
Total Bilirubin: 0.9 mg/dL (ref 0.3–1.2)
Total Protein: 7.1 g/dL (ref 6.5–8.1)

## 2020-04-04 LAB — IRON AND TIBC
Iron: 19 ug/dL — ABNORMAL LOW (ref 42–163)
Saturation Ratios: 15 % — ABNORMAL LOW (ref 20–55)
TIBC: 124 ug/dL — ABNORMAL LOW (ref 202–409)
UIBC: 105 ug/dL — ABNORMAL LOW (ref 117–376)

## 2020-04-04 LAB — CBC WITH DIFFERENTIAL (CANCER CENTER ONLY)
Abs Immature Granulocytes: 0.06 10*3/uL (ref 0.00–0.07)
Basophils Absolute: 0 10*3/uL (ref 0.0–0.1)
Basophils Relative: 0 %
Eosinophils Absolute: 0.1 10*3/uL (ref 0.0–0.5)
Eosinophils Relative: 2 %
HCT: 30.9 % — ABNORMAL LOW (ref 39.0–52.0)
Hemoglobin: 9.2 g/dL — ABNORMAL LOW (ref 13.0–17.0)
Immature Granulocytes: 1 %
Lymphocytes Relative: 16 %
Lymphs Abs: 1.1 10*3/uL (ref 0.7–4.0)
MCH: 22.9 pg — ABNORMAL LOW (ref 26.0–34.0)
MCHC: 29.8 g/dL — ABNORMAL LOW (ref 30.0–36.0)
MCV: 77.1 fL — ABNORMAL LOW (ref 80.0–100.0)
Monocytes Absolute: 0.5 10*3/uL (ref 0.1–1.0)
Monocytes Relative: 7 %
Neutro Abs: 5.4 10*3/uL (ref 1.7–7.7)
Neutrophils Relative %: 74 %
Platelet Count: 294 10*3/uL (ref 150–400)
RBC: 4.01 MIL/uL — ABNORMAL LOW (ref 4.22–5.81)
RDW: 20.9 % — ABNORMAL HIGH (ref 11.5–15.5)
WBC Count: 7.2 10*3/uL (ref 4.0–10.5)
nRBC: 0.3 % — ABNORMAL HIGH (ref 0.0–0.2)

## 2020-04-04 LAB — FERRITIN: Ferritin: 480 ng/mL — ABNORMAL HIGH (ref 24–336)

## 2020-04-04 MED ORDER — PALONOSETRON HCL INJECTION 0.25 MG/5ML
INTRAVENOUS | Status: AC
  Filled 2020-04-04: qty 5

## 2020-04-04 MED ORDER — DIPHENHYDRAMINE HCL 50 MG/ML IJ SOLN
50.0000 mg | Freq: Once | INTRAMUSCULAR | Status: AC
  Administered 2020-04-04: 50 mg via INTRAVENOUS

## 2020-04-04 MED ORDER — SODIUM CHLORIDE 0.9 % IV SOLN
45.0000 mg/m2 | Freq: Once | INTRAVENOUS | Status: AC
  Administered 2020-04-04: 102 mg via INTRAVENOUS
  Filled 2020-04-04: qty 17

## 2020-04-04 MED ORDER — DIPHENHYDRAMINE HCL 50 MG/ML IJ SOLN
INTRAMUSCULAR | Status: AC
  Filled 2020-04-04: qty 1

## 2020-04-04 MED ORDER — SONAFINE EX EMUL
1.0000 "application " | Freq: Once | CUTANEOUS | Status: AC
  Administered 2020-04-04: 1 via TOPICAL

## 2020-04-04 MED ORDER — SODIUM CHLORIDE 0.9 % IV SOLN
20.0000 mg | Freq: Once | INTRAVENOUS | Status: AC
  Administered 2020-04-04: 20 mg via INTRAVENOUS
  Filled 2020-04-04: qty 20

## 2020-04-04 MED ORDER — PALONOSETRON HCL INJECTION 0.25 MG/5ML
0.2500 mg | Freq: Once | INTRAVENOUS | Status: AC
  Administered 2020-04-04: 0.25 mg via INTRAVENOUS

## 2020-04-04 MED ORDER — FAMOTIDINE IN NACL 20-0.9 MG/50ML-% IV SOLN
20.0000 mg | Freq: Once | INTRAVENOUS | Status: AC
  Administered 2020-04-04: 20 mg via INTRAVENOUS

## 2020-04-04 MED ORDER — SODIUM CHLORIDE 0.9 % IV SOLN
Freq: Once | INTRAVENOUS | Status: AC
  Filled 2020-04-04: qty 250

## 2020-04-04 MED ORDER — FAMOTIDINE IN NACL 20-0.9 MG/50ML-% IV SOLN
INTRAVENOUS | Status: AC
  Filled 2020-04-04: qty 50

## 2020-04-04 MED ORDER — SODIUM CHLORIDE 0.9 % IV SOLN
205.6000 mg | Freq: Once | INTRAVENOUS | Status: AC
  Administered 2020-04-04: 210 mg via INTRAVENOUS
  Filled 2020-04-04: qty 21

## 2020-04-04 NOTE — Patient Instructions (Signed)
Cancer Center Discharge Instructions for Patients Receiving Chemotherapy  Today you received the following chemotherapy agents: Paclitaxel (Taxol) and Carboplatin.  To help prevent nausea and vomiting after your treatment, we encourage you to take your nausea medication as directed by your MD.   If you develop nausea and vomiting that is not controlled by your nausea medication, call the clinic.   BELOW ARE SYMPTOMS THAT SHOULD BE REPORTED IMMEDIATELY:  *FEVER GREATER THAN 100.5 F  *CHILLS WITH OR WITHOUT FEVER  NAUSEA AND VOMITING THAT IS NOT CONTROLLED WITH YOUR NAUSEA MEDICATION  *UNUSUAL SHORTNESS OF BREATH  *UNUSUAL BRUISING OR BLEEDING  TENDERNESS IN MOUTH AND THROAT WITH OR WITHOUT PRESENCE OF ULCERS  *URINARY PROBLEMS  *BOWEL PROBLEMS  UNUSUAL RASH Items with * indicate a potential emergency and should be followed up as soon as possible.  Feel free to call the clinic should you have any questions or concerns. The clinic phone number is [phone].  Please show the CHEMO ALERT CARD at check-in to the Emergency Department and triage nurse.    

## 2020-04-04 NOTE — Telephone Encounter (Signed)
I have spoken to patient to advise of appointment with Dr Ardis Hughs on 04/05/20 at 230 pm with 215 pm arrival. Patient verbalizes understanding of this appointment and states he will be here.

## 2020-04-04 NOTE — Progress Notes (Signed)

## 2020-04-05 ENCOUNTER — Ambulatory Visit: Payer: Medicare HMO | Admitting: Gastroenterology

## 2020-04-05 ENCOUNTER — Other Ambulatory Visit: Payer: Self-pay

## 2020-04-05 ENCOUNTER — Ambulatory Visit
Admission: RE | Admit: 2020-04-05 | Discharge: 2020-04-05 | Disposition: A | Discharge status: Home / Self Care | Payer: Medicare HMO | Source: Ambulatory Visit | Attending: Radiation Oncology | Admitting: Radiation Oncology

## 2020-04-05 DIAGNOSIS — R0600 Dyspnea, unspecified: Secondary | ICD-10-CM | POA: Diagnosis not present

## 2020-04-05 DIAGNOSIS — J189 Pneumonia, unspecified organism: Secondary | ICD-10-CM | POA: Diagnosis not present

## 2020-04-05 DIAGNOSIS — R0602 Shortness of breath: Secondary | ICD-10-CM | POA: Diagnosis not present

## 2020-04-05 DIAGNOSIS — C3431 Malignant neoplasm of lower lobe, right bronchus or lung: Secondary | ICD-10-CM | POA: Diagnosis not present

## 2020-04-05 DIAGNOSIS — R7989 Other specified abnormal findings of blood chemistry: Secondary | ICD-10-CM | POA: Diagnosis not present

## 2020-04-06 ENCOUNTER — Other Ambulatory Visit: Payer: Self-pay

## 2020-04-06 ENCOUNTER — Ambulatory Visit
Admission: RE | Admit: 2020-04-06 | Discharge: 2020-04-06 | Disposition: A | Discharge status: Home / Self Care | Payer: Medicare HMO | Source: Ambulatory Visit | Attending: Radiation Oncology | Admitting: Radiation Oncology

## 2020-04-06 ENCOUNTER — Encounter: Payer: Self-pay | Admitting: Nurse Practitioner

## 2020-04-07 ENCOUNTER — Other Ambulatory Visit: Payer: Self-pay

## 2020-04-07 ENCOUNTER — Ambulatory Visit
Admission: RE | Admit: 2020-04-07 | Discharge: 2020-04-07 | Disposition: A | Discharge status: Home / Self Care | Payer: Medicare HMO | Source: Ambulatory Visit | Attending: Radiation Oncology | Admitting: Radiation Oncology

## 2020-04-07 ENCOUNTER — Emergency Department (HOSPITAL_COMMUNITY): Payer: Medicare HMO

## 2020-04-07 ENCOUNTER — Encounter (HOSPITAL_COMMUNITY): Payer: Self-pay | Admitting: Radiology

## 2020-04-07 ENCOUNTER — Inpatient Hospital Stay (HOSPITAL_COMMUNITY)
Admission: EM | Admit: 2020-04-07 | Discharge: 2020-04-15 | DRG: 193 | Disposition: A | Discharge status: Home / Self Care | Payer: Medicare HMO | Attending: Internal Medicine | Admitting: Internal Medicine

## 2020-04-07 DIAGNOSIS — E1165 Type 2 diabetes mellitus with hyperglycemia: Secondary | ICD-10-CM | POA: Diagnosis present

## 2020-04-07 DIAGNOSIS — Z8673 Personal history of transient ischemic attack (TIA), and cerebral infarction without residual deficits: Secondary | ICD-10-CM

## 2020-04-07 DIAGNOSIS — E538 Deficiency of other specified B group vitamins: Secondary | ICD-10-CM | POA: Diagnosis present

## 2020-04-07 DIAGNOSIS — R0600 Dyspnea, unspecified: Secondary | ICD-10-CM

## 2020-04-07 DIAGNOSIS — R1319 Other dysphagia: Secondary | ICD-10-CM

## 2020-04-07 DIAGNOSIS — C3431 Malignant neoplasm of lower lobe, right bronchus or lung: Secondary | ICD-10-CM | POA: Diagnosis present

## 2020-04-07 DIAGNOSIS — Z86718 Personal history of other venous thrombosis and embolism: Secondary | ICD-10-CM

## 2020-04-07 DIAGNOSIS — K2950 Unspecified chronic gastritis without bleeding: Secondary | ICD-10-CM | POA: Diagnosis present

## 2020-04-07 DIAGNOSIS — D61810 Antineoplastic chemotherapy induced pancytopenia: Secondary | ICD-10-CM | POA: Diagnosis present

## 2020-04-07 DIAGNOSIS — T451X5A Adverse effect of antineoplastic and immunosuppressive drugs, initial encounter: Secondary | ICD-10-CM | POA: Diagnosis present

## 2020-04-07 DIAGNOSIS — J189 Pneumonia, unspecified organism: Principal | ICD-10-CM

## 2020-04-07 DIAGNOSIS — I10 Essential (primary) hypertension: Secondary | ICD-10-CM | POA: Diagnosis present

## 2020-04-07 DIAGNOSIS — K2980 Duodenitis without bleeding: Secondary | ICD-10-CM | POA: Diagnosis present

## 2020-04-07 DIAGNOSIS — R7989 Other specified abnormal findings of blood chemistry: Secondary | ICD-10-CM | POA: Diagnosis not present

## 2020-04-07 DIAGNOSIS — R1310 Dysphagia, unspecified: Secondary | ICD-10-CM | POA: Diagnosis present

## 2020-04-07 DIAGNOSIS — E785 Hyperlipidemia, unspecified: Secondary | ICD-10-CM | POA: Diagnosis present

## 2020-04-07 DIAGNOSIS — M1990 Unspecified osteoarthritis, unspecified site: Secondary | ICD-10-CM | POA: Diagnosis present

## 2020-04-07 DIAGNOSIS — K2211 Ulcer of esophagus with bleeding: Secondary | ICD-10-CM | POA: Diagnosis present

## 2020-04-07 DIAGNOSIS — R069 Unspecified abnormalities of breathing: Secondary | ICD-10-CM | POA: Diagnosis not present

## 2020-04-07 DIAGNOSIS — R0902 Hypoxemia: Secondary | ICD-10-CM | POA: Diagnosis not present

## 2020-04-07 DIAGNOSIS — Z87891 Personal history of nicotine dependence: Secondary | ICD-10-CM

## 2020-04-07 DIAGNOSIS — Z9889 Other specified postprocedural states: Secondary | ICD-10-CM

## 2020-04-07 DIAGNOSIS — D630 Anemia in neoplastic disease: Secondary | ICD-10-CM | POA: Diagnosis present

## 2020-04-07 DIAGNOSIS — R948 Abnormal results of function studies of other organs and systems: Secondary | ICD-10-CM

## 2020-04-07 DIAGNOSIS — K449 Diaphragmatic hernia without obstruction or gangrene: Secondary | ICD-10-CM | POA: Diagnosis present

## 2020-04-07 DIAGNOSIS — K2210 Ulcer of esophagus without bleeding: Secondary | ICD-10-CM

## 2020-04-07 DIAGNOSIS — R0602 Shortness of breath: Secondary | ICD-10-CM | POA: Diagnosis not present

## 2020-04-07 DIAGNOSIS — Z20822 Contact with and (suspected) exposure to covid-19: Secondary | ICD-10-CM | POA: Diagnosis present

## 2020-04-07 DIAGNOSIS — R911 Solitary pulmonary nodule: Secondary | ICD-10-CM

## 2020-04-07 DIAGNOSIS — R000 Tachycardia, unspecified: Secondary | ICD-10-CM | POA: Diagnosis not present

## 2020-04-07 DIAGNOSIS — J439 Emphysema, unspecified: Secondary | ICD-10-CM | POA: Diagnosis present

## 2020-04-07 DIAGNOSIS — Z7982 Long term (current) use of aspirin: Secondary | ICD-10-CM

## 2020-04-07 DIAGNOSIS — Z79899 Other long term (current) drug therapy: Secondary | ICD-10-CM

## 2020-04-07 DIAGNOSIS — Z7984 Long term (current) use of oral hypoglycemic drugs: Secondary | ICD-10-CM

## 2020-04-07 DIAGNOSIS — E876 Hypokalemia: Secondary | ICD-10-CM | POA: Diagnosis present

## 2020-04-07 DIAGNOSIS — I700 Atherosclerosis of aorta: Secondary | ICD-10-CM | POA: Diagnosis present

## 2020-04-07 LAB — LACTIC ACID, PLASMA: Lactic Acid, Venous: 2.8 mmol/L (ref 0.5–1.9)

## 2020-04-07 LAB — CBC WITH DIFFERENTIAL/PLATELET
Abs Immature Granulocytes: 0.02 10*3/uL (ref 0.00–0.07)
Basophils Absolute: 0 10*3/uL (ref 0.0–0.1)
Basophils Relative: 0 %
Eosinophils Absolute: 0.1 10*3/uL (ref 0.0–0.5)
Eosinophils Relative: 2 %
HCT: 32.1 % — ABNORMAL LOW (ref 39.0–52.0)
Hemoglobin: 9.6 g/dL — ABNORMAL LOW (ref 13.0–17.0)
Immature Granulocytes: 1 %
Lymphocytes Relative: 15 %
Lymphs Abs: 0.5 10*3/uL — ABNORMAL LOW (ref 0.7–4.0)
MCH: 23.2 pg — ABNORMAL LOW (ref 26.0–34.0)
MCHC: 29.9 g/dL — ABNORMAL LOW (ref 30.0–36.0)
MCV: 77.5 fL — ABNORMAL LOW (ref 80.0–100.0)
Monocytes Absolute: 0.1 10*3/uL (ref 0.1–1.0)
Monocytes Relative: 2 %
Neutro Abs: 2.5 10*3/uL (ref 1.7–7.7)
Neutrophils Relative %: 80 %
Platelets: 240 10*3/uL (ref 150–400)
RBC: 4.14 MIL/uL — ABNORMAL LOW (ref 4.22–5.81)
RDW: 21.4 % — ABNORMAL HIGH (ref 11.5–15.5)
WBC: 3.1 10*3/uL — ABNORMAL LOW (ref 4.0–10.5)
nRBC: 0 % (ref 0.0–0.2)

## 2020-04-07 LAB — COMPREHENSIVE METABOLIC PANEL
ALT: 14 U/L (ref 0–44)
AST: 18 U/L (ref 15–41)
Albumin: 2.7 g/dL — ABNORMAL LOW (ref 3.5–5.0)
Alkaline Phosphatase: 50 U/L (ref 38–126)
Anion gap: 18 — ABNORMAL HIGH (ref 5–15)
BUN: 13 mg/dL (ref 8–23)
CO2: 17 mmol/L — ABNORMAL LOW (ref 22–32)
Calcium: 8.4 mg/dL — ABNORMAL LOW (ref 8.9–10.3)
Chloride: 103 mmol/L (ref 98–111)
Creatinine, Ser: 0.82 mg/dL (ref 0.61–1.24)
GFR calc Af Amer: >60 mL/min (ref >60)
GFR calc non Af Amer: >60 mL/min (ref >60)
Glucose, Bld: 144 mg/dL — ABNORMAL HIGH (ref 70–99)
Potassium: 4.3 mmol/L (ref 3.5–5.1)
Sodium: 138 mmol/L (ref 135–145)
Total Bilirubin: 1.2 mg/dL (ref 0.3–1.2)
Total Protein: 6.9 g/dL (ref 6.5–8.1)

## 2020-04-07 LAB — BRAIN NATRIURETIC PEPTIDE: B Natriuretic Peptide: 101.6 pg/mL — ABNORMAL HIGH (ref 0.0–100.0)

## 2020-04-07 LAB — SARS CORONAVIRUS 2 BY RT PCR (HOSPITAL ORDER, PERFORMED IN ~~LOC~~ HOSPITAL LAB): SARS Coronavirus 2: NEGATIVE

## 2020-04-07 LAB — TROPONIN I (HIGH SENSITIVITY): Troponin I (High Sensitivity): 4 ng/L (ref <18)

## 2020-04-07 LAB — PROTIME-INR
INR: 1.2 (ref 0.8–1.2)
Prothrombin Time: 14.9 seconds (ref 11.4–15.2)

## 2020-04-07 LAB — D-DIMER, QUANTITATIVE: D-Dimer, Quant: 2.33 ug/mL-FEU — ABNORMAL HIGH (ref 0.00–0.50)

## 2020-04-07 MED ORDER — SODIUM CHLORIDE 0.9 % IV BOLUS
500.0000 mL | Freq: Once | INTRAVENOUS | Status: AC
  Administered 2020-04-07: 500 mL via INTRAVENOUS

## 2020-04-07 MED ORDER — SODIUM CHLORIDE 0.9 % IV SOLN
2.0000 g | Freq: Once | INTRAVENOUS | Status: AC
  Administered 2020-04-08: 2 g via INTRAVENOUS
  Filled 2020-04-07: qty 2

## 2020-04-07 MED ORDER — VANCOMYCIN HCL 1500 MG/300ML IV SOLN
1500.0000 mg | Freq: Once | INTRAVENOUS | Status: AC
  Administered 2020-04-08: 1500 mg via INTRAVENOUS
  Filled 2020-04-07: qty 300

## 2020-04-07 MED ORDER — IOHEXOL 350 MG/ML SOLN
100.0000 mL | Freq: Once | INTRAVENOUS | Status: AC | PRN
  Administered 2020-04-07: 100 mL via INTRAVENOUS

## 2020-04-07 NOTE — ED Provider Notes (Addendum)
[facility] DEPT Provider Note   CSN: [id number] Arrival date & time: 04/07/20  1951     History Chief Complaint  Patient presents with  . Shortness of Breath    Scott Conley is a 79 y.o. male.  79 year old male with prior medical history as detailed low presents for evaluation of reported dyspnea.  Patient reports that he is currently on chemo and radiation for treatment of lung cancer.  Patient reports that his last radiation treatment was this morning.  After treatment he went home.  He then developed some mild cough and felt worsening dyspnea.  He denies associated chest pain or fever.  He denies other associated symptoms.  The history is provided by the patient and medical records.  Shortness of Breath Severity:  Mild Onset quality:  Gradual Duration:  5 hours Timing:  Constant Progression:  Unchanged Chronicity:  New Relieved by:  Nothing Worsened by:  Nothing Ineffective treatments:  None tried Associated symptoms: no chest pain        Past Medical History:  Diagnosis Date  . Aortic atherosclerosis ([place])   . Arthritis   . Blood clot in vein   . CVA (cerebral infarction)   . Diabetes ([place])   . DVT (deep venous thrombosis) ([place])   . Emphysema lung ([place])   . Hypertension   . Lung cancer ([place])   . Osteoarthritis   . Stroke [facility])    Left hand stiffness     Patient Active Problem List   Diagnosis Date Noted  . Goals of care, counseling/discussion 03/20/2020  . Encounter for antineoplastic chemotherapy 03/20/2020  . Malignant neoplasm of bronchus of right lower lobe ([place])   . Hilar mass   . HLD (hyperlipidemia)   . Hypokalemia   . Hyponatremia   . Microcytic anemia   . Medicare annual wellness visit, subsequent 11/21/2016  . Obesity 11/21/2016  . Diabetes ([place]) 11/21/2015  . Hypertension 11/21/2015  . Low back strain 11/21/2015  . Tobacco use 03/01/2014  . Ataxia, late effect of cerebrovascular disease 01/18/2014  .  CVA (cerebral infarction) 11/30/2013  . Stroke [facility]) 11/26/2013    Past Surgical History:  Procedure Laterality Date  . APPENDECTOMY    . BIOPSY  03/05/2020   Procedure: BIOPSY;  Surgeon: Garner Nash, DO;  Location: [facility] ENDOSCOPY;  Service: Pulmonary;;  . CRYOTHERAPY  03/05/2020   Procedure: CRYOTHERAPY;  Surgeon: Garner Nash, DO;  Location: [facility] ENDOSCOPY;  Service: Pulmonary;;  . CYST REMOVAL LEG     removed from right groin  . HEMOSTASIS CONTROL  03/05/2020   Procedure: HEMOSTASIS CONTROL;  Surgeon: Garner Nash, DO;  Location: [facility] ENDOSCOPY;  Service: Pulmonary;;  . LOOP RECORDER IMPLANT  11/29/13   MDT LinQ implanted by Dr Caryl Comes for cryptogenic stroke  . LOOP RECORDER IMPLANT N/A 11/29/2013   Procedure: LOOP RECORDER IMPLANT;  Surgeon: Deboraha Sprang, MD;  Location[facility] CATH LAB;  Service: Cardiovascular;  Laterality: N/A;  . TEE WITHOUT CARDIOVERSION N/A 11/29/2013   Procedure: TRANSESOPHAGEAL ECHOCARDIOGRAM (TEE);  Surgeon: Sueanne Margarita, MD;  Location: [facility] ENDOSCOPY;  Service: Cardiovascular;  Laterality: N/A;  . TONSILLECTOMY    . VIDEO BRONCHOSCOPY WITH ENDOBRONCHIAL ULTRASOUND N/A 03/05/2020   Procedure: VIDEO BRONCHOSCOPY WITH ENDOBRONCHIAL ULTRASOUND;  Surgeon: Garner Nash, DO;  Location: [facility];  Service: Pulmonary;  Laterality: N/A;       Family History  Problem Relation Age of Onset  . Breast cancer Mother   . Breast  cancer Maternal Grandmother   . Cirrhosis Brother     Social History   Tobacco Use  . Smoking status: Former Smoker    Packs/day: 0.50    Years: 58.00    Pack years: 29.00    Types: Cigarettes    Start date: 11/30/2013  . Smokeless tobacco: Never Used  Vaping Use  . Vaping Use: Never used  Substance Use Topics  . Alcohol use: No    Comment: none since 1985; hx alcohol abuse  . Drug use: No    Home Medications Prior to Admission medications   Medication Sig Start Date End Date Taking? Authorizing Provider  arformoterol (BROVANA)  15 MCG/2ML NEBU Take 2 mLs (15 mcg total) by nebulization 2 (two) times daily. Patient not taking: Reported on 03/20/2020 03/05/20   Kayleen Memos, DO  aspirin EC 81 MG tablet Take 1 tablet (81 mg total) by mouth daily. Swallow whole. 03/06/20 03/06/21  Kayleen Memos, DO  atorvastatin (LIPITOR) 80 MG tablet Take 1 tablet (80 mg total) by mouth daily. Must keep appt w/new provider for future refills 03/05/20   Kayleen Memos, DO  bisacodyl (DULCOLAX) 10 MG suppository Place 1 suppository (10 mg total) rectally daily as needed for severe constipation. Patient not taking: Reported on 03/20/2020 03/05/20   Kayleen Memos, DO  budesonide (PULMICORT) 0.25 MG/2ML nebulizer solution Take 2 mLs (0.25 mg total) by nebulization 2 (two) times daily. Patient not taking: Reported on 03/20/2020 03/05/20   Kayleen Memos, DO  carvedilol (COREG) 6.25 MG tablet Take 1 tablet (6.25 mg total) by mouth 2 (two) times daily with a meal. 03/05/20   Kayleen Memos, DO  docusate sodium (COLACE) 100 MG capsule Take 200 mg by mouth 2 (two) times daily.    [provider]  polyethylene glycol (MIRALAX) 17 g packet Take 17 g by mouth daily as needed for moderate constipation. Patient not taking: Reported on 03/20/2020 03/05/20   Kayleen Memos, DO  prochlorperazine (COMPAZINE) 10 MG tablet Take 1 tablet (10 mg total) by mouth every 6 (six) hours as needed for nausea or vomiting. 03/20/20   Curt Bears, MD  senna (SENOKOT) 8.6 MG TABS tablet Take 2 tablets (17.2 mg total) by mouth daily as needed for moderate constipation. Patient not taking: Reported on 03/20/2020 03/05/20   Kayleen Memos, DO  sitaGLIPtin (JANUVIA) 100 MG tablet Take 1 tablet (100 mg total) by mouth daily. Must keep appt w/new provider for future refills 06/23/17   Biagio Borg, MD    Allergies    Patient has no known allergies.  Review of Systems   Review of Systems  Respiratory: Positive for shortness of breath.   Cardiovascular: Negative for chest pain.    All other systems reviewed and are negative.   Physical Exam Updated Vital Signs BP 118/80   Pulse (!) 108   Temp 97.6 F (36.4 C) (Oral)   Resp (!) 28   SpO2 98%   Physical Exam Vitals and nursing note reviewed.  Constitutional:      General: He is not in acute distress.    Appearance: He is well-developed.  HENT:     Head: Normocephalic and atraumatic.  Eyes:     Conjunctiva/sclera: Conjunctivae normal.     Pupils: Pupils are equal, round, and reactive to light.  Cardiovascular:     Rate and Rhythm: Normal rate and regular rhythm.     Heart sounds: Normal heart sounds.  Pulmonary:  Effort: Pulmonary effort is normal. No respiratory distress.     Breath sounds: Normal breath sounds.  Abdominal:     General: There is no distension.     Palpations: Abdomen is soft.     Tenderness: There is no abdominal tenderness.  Musculoskeletal:        General: No deformity. Normal range of motion.     Cervical back: Normal range of motion and neck supple.  Skin:    General: Skin is warm and dry.  Neurological:     General: No focal deficit present.     Mental Status: He is alert and oriented to person, place, and time.     ED Results / Procedures / Treatments   Labs (all labs ordered are listed, but only abnormal results are displayed) Labs Reviewed  LACTIC ACID, PLASMA - Abnormal; Notable for the following components:      Result Value   Lactic Acid, Venous 2.8 (*)    All other components within normal limits  CBC WITH DIFFERENTIAL/PLATELET - Abnormal; Notable for the following components:   WBC 3.1 (*)    RBC 4.14 (*)    Hemoglobin 9.6 (*)    HCT 32.1 (*)    MCV 77.5 (*)    MCH 23.2 (*)    MCHC 29.9 (*)    RDW 21.4 (*)    Lymphs Abs 0.5 (*)    All other components within normal limits  D-DIMER, QUANTITATIVE (NOT AT [facility]) - Abnormal; Notable for the following components:   D-Dimer, Quant 2.33 (*)    All other components within normal limits  COMPREHENSIVE  METABOLIC PANEL - Abnormal; Notable for the following components:   CO2 17 (*)    Glucose, Bld 144 (*)    Calcium 8.4 (*)    Albumin 2.7 (*)    Anion gap 18 (*)    All other components within normal limits  BRAIN NATRIURETIC PEPTIDE - Abnormal; Notable for the following components:   B Natriuretic Peptide 101.6 (*)    All other components within normal limits  SARS CORONAVIRUS 2 BY RT PCR (HOSPITAL ORDER, [facility])  CULTURE, BLOOD (ROUTINE X 2)  CULTURE, BLOOD (ROUTINE X 2)  PROTIME-INR  LACTIC ACID, PLASMA  URINALYSIS, ROUTINE W REFLEX MICROSCOPIC  TROPONIN I (HIGH SENSITIVITY)  TROPONIN I (HIGH SENSITIVITY)    EKG EKG Interpretation  Date/Time:  Friday April 07 2020 21:31:18 EDT Ventricular Rate:  102 PR Interval:    QRS Duration: 109 QT Interval:  374 QTC Calculation: 488 R Axis:   -37 Text Interpretation: Sinus tachycardia Left axis deviation Probable anteroseptal infarct, old Nonspecific T abnormalities, lateral leads Confirmed by Dene Gentry [phone]) on 04/07/2020 9:34:00 PM   Radiology DG Chest Port 1 View  Result Date: 04/07/2020 CLINICAL DATA:  Stage III right lower lobe non-small cell lung cancer, dyspnea, cough EXAM: PORTABLE CHEST 1 VIEW COMPARISON:  03/04/2020 FINDINGS: 2 frontal views of the chest demonstrate right infrahilar mass unchanged since prior study. No new consolidation, effusion, or pneumothorax. Cardiac silhouette is stable. Loop recorder unchanged. IMPRESSION: 1. Stable right infrahilar mass. 2. No acute airspace disease. Electronically Signed   By: Randa Ngo M.D.   On: 04/07/2020 20:51    Procedures Procedures (including critical care time)  Medications Ordered in ED Medications - No data to display  ED Course  I have reviewed the triage vital signs and the nursing notes.  Pertinent labs & imaging results that were available during my  care of the patient were reviewed by me and considered in my medical  decision making (see chart for details).    MDM Rules/Calculators/A&P                          MDM  Screen complete  CID AGENA was evaluated in Emergency Department on 04/07/2020 for the symptoms described in the history of present illness. He was evaluated in the context of the global COVID-19 pandemic, which necessitated consideration that the patient might be at risk for infection with the SARS-CoV-2 virus that causes COVID-19. Institutional protocols and algorithms that pertain to the evaluation of patients at risk for COVID-19 are in a state of rapid change based on information released by regulatory bodies including the CDC and federal and state organizations. These policies and algorithms were followed during the patient's care in the ED.  Patient is presenting for evaluation of dyspnea with prior history of Lung CA.   Onset appears to be fairly acute (earlier today).  Lactic acid mildly elevated at 2.8.  CTA without PE - post obstructive pneumonia on CT.  Patient is not hypoxic, speaks in full sentences, and appears comfortable.   Dr. Rex Kras aware of pending antibiotics and plan to admit. Hospitalist service to be notified by Dr. Rex Kras.     Final Clinical Impression(s) / ED Diagnoses Final diagnoses:  Dyspnea, unspecified type    Rx / DC Orders ED Discharge Orders    None       Valarie Merino, MD 04/07/20 [id number]    Valarie Merino, MD 04/07/20 2330

## 2020-04-07 NOTE — ED Triage Notes (Addendum)
Patient arrived by EMS A&Ox4. Patient has been diagnosed with stage 3 lung cancer. Had his radiation treatment today. Pt. Takes chemo and radiation treatments throughout the week. Went home at 11am today and developed a cough and SOB.  EMS assessment lung sounds are clear. HR slightly elevated at 110bpm. BP 118/80, 98% room air. CBG 98%.

## 2020-04-07 NOTE — ED Notes (Signed)
Date and time results received: 04/07/20 11:03 PM  (use smartphrase ".now" to insert current time)  Test: Lactic Acid Critical Value: 2.8  Name of Provider Notified: Dr.Messick  Orders Received? Or Actions Taken?:

## 2020-04-07 NOTE — Progress Notes (Signed)
Pharmacy Note   A consult was received from an ED physician for vancomycin and cefepime  per pharmacy dosing.    The patient's profile has been reviewed for ht/wt/allergies/indication/available labs.    A one time order has been placed for vancomycin 1500 mg IV x1 and cefepime 2 gr IV x1 .    Further antibiotics/pharmacy consults should be ordered by admitting physician if indicated.                       Thank you,  Royetta Asal, PharmD, BCPS 04/07/2020 11:35 PM

## 2020-04-08 ENCOUNTER — Encounter (HOSPITAL_COMMUNITY): Payer: Self-pay | Admitting: Internal Medicine

## 2020-04-08 DIAGNOSIS — K3189 Other diseases of stomach and duodenum: Secondary | ICD-10-CM | POA: Diagnosis not present

## 2020-04-08 DIAGNOSIS — K2970 Gastritis, unspecified, without bleeding: Secondary | ICD-10-CM | POA: Diagnosis not present

## 2020-04-08 DIAGNOSIS — R911 Solitary pulmonary nodule: Secondary | ICD-10-CM | POA: Diagnosis not present

## 2020-04-08 DIAGNOSIS — Z7984 Long term (current) use of oral hypoglycemic drugs: Secondary | ICD-10-CM | POA: Diagnosis not present

## 2020-04-08 DIAGNOSIS — C3412 Malignant neoplasm of upper lobe, left bronchus or lung: Secondary | ICD-10-CM | POA: Diagnosis not present

## 2020-04-08 DIAGNOSIS — R634 Abnormal weight loss: Secondary | ICD-10-CM | POA: Diagnosis not present

## 2020-04-08 DIAGNOSIS — Z515 Encounter for palliative care: Secondary | ICD-10-CM | POA: Diagnosis not present

## 2020-04-08 DIAGNOSIS — D630 Anemia in neoplastic disease: Secondary | ICD-10-CM | POA: Diagnosis present

## 2020-04-08 DIAGNOSIS — Z86718 Personal history of other venous thrombosis and embolism: Secondary | ICD-10-CM | POA: Diagnosis not present

## 2020-04-08 DIAGNOSIS — J439 Emphysema, unspecified: Secondary | ICD-10-CM | POA: Diagnosis present

## 2020-04-08 DIAGNOSIS — R410 Disorientation, unspecified: Secondary | ICD-10-CM | POA: Diagnosis not present

## 2020-04-08 DIAGNOSIS — K2950 Unspecified chronic gastritis without bleeding: Secondary | ICD-10-CM | POA: Diagnosis not present

## 2020-04-08 DIAGNOSIS — K2980 Duodenitis without bleeding: Secondary | ICD-10-CM | POA: Diagnosis not present

## 2020-04-08 DIAGNOSIS — J189 Pneumonia, unspecified organism: Secondary | ICD-10-CM | POA: Diagnosis not present

## 2020-04-08 DIAGNOSIS — K2211 Ulcer of esophagus with bleeding: Secondary | ICD-10-CM | POA: Diagnosis not present

## 2020-04-08 DIAGNOSIS — R933 Abnormal findings on diagnostic imaging of other parts of digestive tract: Secondary | ICD-10-CM | POA: Diagnosis not present

## 2020-04-08 DIAGNOSIS — E1165 Type 2 diabetes mellitus with hyperglycemia: Secondary | ICD-10-CM | POA: Diagnosis not present

## 2020-04-08 DIAGNOSIS — I10 Essential (primary) hypertension: Secondary | ICD-10-CM | POA: Diagnosis not present

## 2020-04-08 DIAGNOSIS — D61810 Antineoplastic chemotherapy induced pancytopenia: Secondary | ICD-10-CM | POA: Diagnosis not present

## 2020-04-08 DIAGNOSIS — Z79899 Other long term (current) drug therapy: Secondary | ICD-10-CM | POA: Diagnosis not present

## 2020-04-08 DIAGNOSIS — R0600 Dyspnea, unspecified: Secondary | ICD-10-CM | POA: Diagnosis not present

## 2020-04-08 DIAGNOSIS — R531 Weakness: Secondary | ICD-10-CM | POA: Diagnosis not present

## 2020-04-08 DIAGNOSIS — K259 Gastric ulcer, unspecified as acute or chronic, without hemorrhage or perforation: Secondary | ICD-10-CM | POA: Diagnosis not present

## 2020-04-08 DIAGNOSIS — R1310 Dysphagia, unspecified: Secondary | ICD-10-CM | POA: Diagnosis not present

## 2020-04-08 DIAGNOSIS — M1990 Unspecified osteoarthritis, unspecified site: Secondary | ICD-10-CM | POA: Diagnosis present

## 2020-04-08 DIAGNOSIS — Z7189 Other specified counseling: Secondary | ICD-10-CM | POA: Diagnosis not present

## 2020-04-08 DIAGNOSIS — R4182 Altered mental status, unspecified: Secondary | ICD-10-CM | POA: Diagnosis not present

## 2020-04-08 DIAGNOSIS — C3431 Malignant neoplasm of lower lobe, right bronchus or lung: Secondary | ICD-10-CM | POA: Diagnosis not present

## 2020-04-08 DIAGNOSIS — R918 Other nonspecific abnormal finding of lung field: Secondary | ICD-10-CM | POA: Diagnosis not present

## 2020-04-08 DIAGNOSIS — Z20822 Contact with and (suspected) exposure to covid-19: Secondary | ICD-10-CM | POA: Diagnosis not present

## 2020-04-08 DIAGNOSIS — Z8673 Personal history of transient ischemic attack (TIA), and cerebral infarction without residual deficits: Secondary | ICD-10-CM | POA: Diagnosis not present

## 2020-04-08 DIAGNOSIS — Z9889 Other specified postprocedural states: Secondary | ICD-10-CM | POA: Diagnosis not present

## 2020-04-08 DIAGNOSIS — Z87891 Personal history of nicotine dependence: Secondary | ICD-10-CM | POA: Diagnosis not present

## 2020-04-08 DIAGNOSIS — E785 Hyperlipidemia, unspecified: Secondary | ICD-10-CM | POA: Diagnosis present

## 2020-04-08 DIAGNOSIS — E876 Hypokalemia: Secondary | ICD-10-CM | POA: Diagnosis present

## 2020-04-08 DIAGNOSIS — Z7982 Long term (current) use of aspirin: Secondary | ICD-10-CM | POA: Diagnosis not present

## 2020-04-08 DIAGNOSIS — K2091 Esophagitis, unspecified with bleeding: Secondary | ICD-10-CM | POA: Diagnosis not present

## 2020-04-08 DIAGNOSIS — K449 Diaphragmatic hernia without obstruction or gangrene: Secondary | ICD-10-CM | POA: Diagnosis not present

## 2020-04-08 DIAGNOSIS — K2210 Ulcer of esophagus without bleeding: Secondary | ICD-10-CM | POA: Diagnosis not present

## 2020-04-08 DIAGNOSIS — T451X5A Adverse effect of antineoplastic and immunosuppressive drugs, initial encounter: Secondary | ICD-10-CM | POA: Diagnosis present

## 2020-04-08 DIAGNOSIS — R948 Abnormal results of function studies of other organs and systems: Secondary | ICD-10-CM | POA: Diagnosis not present

## 2020-04-08 DIAGNOSIS — I700 Atherosclerosis of aorta: Secondary | ICD-10-CM | POA: Diagnosis present

## 2020-04-08 DIAGNOSIS — E538 Deficiency of other specified B group vitamins: Secondary | ICD-10-CM | POA: Diagnosis present

## 2020-04-08 LAB — URINALYSIS, ROUTINE W REFLEX MICROSCOPIC
Bilirubin Urine: NEGATIVE
Glucose, UA: NEGATIVE mg/dL
Hgb urine dipstick: NEGATIVE
Ketones, ur: 5 mg/dL — AB
Leukocytes,Ua: NEGATIVE
Nitrite: NEGATIVE
Protein, ur: NEGATIVE mg/dL
Specific Gravity, Urine: >1.046 — ABNORMAL HIGH (ref 1.005–1.030)
pH: 6 (ref 5.0–8.0)

## 2020-04-08 LAB — GLUCOSE, CAPILLARY
Glucose-Capillary: 125 mg/dL — ABNORMAL HIGH (ref 70–99)
Glucose-Capillary: 110 mg/dL — ABNORMAL HIGH (ref 70–99)
Glucose-Capillary: 105 mg/dL — ABNORMAL HIGH (ref 70–99)

## 2020-04-08 LAB — BASIC METABOLIC PANEL
Anion gap: 17 — ABNORMAL HIGH (ref 5–15)
BUN: 13 mg/dL (ref 8–23)
CO2: 16 mmol/L — ABNORMAL LOW (ref 22–32)
Calcium: 7.5 mg/dL — ABNORMAL LOW (ref 8.9–10.3)
Chloride: 106 mmol/L (ref 98–111)
Creatinine, Ser: 0.64 mg/dL (ref 0.61–1.24)
GFR calc Af Amer: >60 mL/min (ref >60)
GFR calc non Af Amer: >60 mL/min (ref >60)
Glucose, Bld: 141 mg/dL — ABNORMAL HIGH (ref 70–99)
Potassium: 4 mmol/L (ref 3.5–5.1)
Sodium: 139 mmol/L (ref 135–145)

## 2020-04-08 LAB — CBC
HCT: 28.4 % — ABNORMAL LOW (ref 39.0–52.0)
Hemoglobin: 8.7 g/dL — ABNORMAL LOW (ref 13.0–17.0)
MCH: 23.3 pg — ABNORMAL LOW (ref 26.0–34.0)
MCHC: 30.6 g/dL (ref 30.0–36.0)
MCV: 76.1 fL — ABNORMAL LOW (ref 80.0–100.0)
Platelets: 220 10*3/uL (ref 150–400)
RBC: 3.73 MIL/uL — ABNORMAL LOW (ref 4.22–5.81)
RDW: 21.4 % — ABNORMAL HIGH (ref 11.5–15.5)
WBC: 3.3 10*3/uL — ABNORMAL LOW (ref 4.0–10.5)
nRBC: 0 % (ref 0.0–0.2)

## 2020-04-08 LAB — CBG MONITORING, ED
Glucose-Capillary: 127 mg/dL — ABNORMAL HIGH (ref 70–99)
Glucose-Capillary: 115 mg/dL — ABNORMAL HIGH (ref 70–99)

## 2020-04-08 LAB — TROPONIN I (HIGH SENSITIVITY): Troponin I (High Sensitivity): 4 ng/L (ref <18)

## 2020-04-08 LAB — LACTIC ACID, PLASMA: Lactic Acid, Venous: 2.4 mmol/L (ref 0.5–1.9)

## 2020-04-08 MED ORDER — ENOXAPARIN SODIUM 40 MG/0.4ML ~~LOC~~ SOLN
40.0000 mg | SUBCUTANEOUS | Status: DC
  Administered 2020-04-08 – 2020-04-10 (×3): 40 mg via SUBCUTANEOUS
  Filled 2020-04-08 (×3): qty 0.4

## 2020-04-08 MED ORDER — ACETAMINOPHEN 325 MG PO TABS
650.0000 mg | ORAL_TABLET | Freq: Four times a day (QID) | ORAL | Status: DC | PRN
  Administered 2020-04-15: 650 mg via ORAL
  Filled 2020-04-08: qty 2

## 2020-04-08 MED ORDER — ACETAMINOPHEN 650 MG RE SUPP: 650.0000 mg | Freq: Four times a day (QID) | RECTAL | Status: DC | PRN

## 2020-04-08 MED ORDER — ASPIRIN EC 81 MG PO TBEC
81.0000 mg | DELAYED_RELEASE_TABLET | Freq: Every day | ORAL | Status: DC
  Administered 2020-04-08 – 2020-04-15 (×8): 81 mg via ORAL
  Filled 2020-04-08 (×8): qty 1

## 2020-04-08 MED ORDER — ATORVASTATIN CALCIUM 40 MG PO TABS
80.0000 mg | ORAL_TABLET | Freq: Every day | ORAL | Status: DC
  Administered 2020-04-08 – 2020-04-15 (×8): 80 mg via ORAL
  Filled 2020-04-08 (×8): qty 2

## 2020-04-08 MED ORDER — ALBUTEROL SULFATE (2.5 MG/3ML) 0.083% IN NEBU
2.5000 mg | INHALATION_SOLUTION | RESPIRATORY_TRACT | Status: DC
  Administered 2020-04-08 (×2): 2.5 mg via RESPIRATORY_TRACT
  Filled 2020-04-08 (×2): qty 3

## 2020-04-08 MED ORDER — ALBUTEROL SULFATE (2.5 MG/3ML) 0.083% IN NEBU
2.5000 mg | INHALATION_SOLUTION | Freq: Four times a day (QID) | RESPIRATORY_TRACT | Status: DC
  Administered 2020-04-08 (×2): 2.5 mg via RESPIRATORY_TRACT
  Filled 2020-04-08 (×2): qty 3

## 2020-04-08 MED ORDER — ALBUTEROL SULFATE (2.5 MG/3ML) 0.083% IN NEBU: 2.5000 mg | INHALATION_SOLUTION | RESPIRATORY_TRACT | Status: DC | PRN

## 2020-04-08 MED ORDER — VANCOMYCIN HCL IN DEXTROSE 1-5 GM/200ML-% IV SOLN
1000.0000 mg | Freq: Two times a day (BID) | INTRAVENOUS | Status: DC
  Administered 2020-04-08 – 2020-04-09 (×2): 1000 mg via INTRAVENOUS
  Filled 2020-04-08 (×2): qty 200

## 2020-04-08 MED ORDER — SODIUM CHLORIDE 0.9 % IV SOLN
2.0000 g | Freq: Three times a day (TID) | INTRAVENOUS | Status: DC
  Administered 2020-04-08 – 2020-04-13 (×16): 2 g via INTRAVENOUS
  Filled 2020-04-08 (×6): qty 2
  Filled 2020-04-08: qty 0.08
  Filled 2020-04-08 (×5): qty 2
  Filled 2020-04-08: qty 0.08
  Filled 2020-04-08 (×3): qty 2

## 2020-04-08 MED ORDER — ONDANSETRON HCL 4 MG PO TABS: 4.0000 mg | ORAL_TABLET | Freq: Four times a day (QID) | ORAL | Status: DC | PRN

## 2020-04-08 MED ORDER — ONDANSETRON HCL 4 MG/2ML IJ SOLN: 4.0000 mg | Freq: Four times a day (QID) | INTRAMUSCULAR | Status: DC | PRN

## 2020-04-08 MED ORDER — BUDESONIDE 0.25 MG/2ML IN SUSP
0.2500 mg | Freq: Two times a day (BID) | RESPIRATORY_TRACT | Status: DC
  Administered 2020-04-08 – 2020-04-15 (×14): 0.25 mg via RESPIRATORY_TRACT
  Filled 2020-04-08 (×14): qty 2

## 2020-04-08 MED ORDER — DOCUSATE SODIUM 100 MG PO CAPS
200.0000 mg | ORAL_CAPSULE | Freq: Two times a day (BID) | ORAL | Status: DC
  Administered 2020-04-08 – 2020-04-15 (×10): 200 mg via ORAL
  Filled 2020-04-08 (×13): qty 2

## 2020-04-08 MED ORDER — CARVEDILOL 6.25 MG PO TABS
6.2500 mg | ORAL_TABLET | Freq: Two times a day (BID) | ORAL | Status: DC
  Administered 2020-04-08 – 2020-04-15 (×15): 6.25 mg via ORAL
  Filled 2020-04-08: qty 1
  Filled 2020-04-08: qty 2
  Filled 2020-04-08 (×13): qty 1

## 2020-04-08 MED ORDER — ALBUTEROL SULFATE (2.5 MG/3ML) 0.083% IN NEBU
2.5000 mg | INHALATION_SOLUTION | Freq: Three times a day (TID) | RESPIRATORY_TRACT | Status: DC
  Administered 2020-04-09 – 2020-04-10 (×6): 2.5 mg via RESPIRATORY_TRACT
  Filled 2020-04-08 (×6): qty 3

## 2020-04-08 MED ORDER — INSULIN ASPART 100 UNIT/ML ~~LOC~~ SOLN
0.0000 [IU] | Freq: Three times a day (TID) | SUBCUTANEOUS | Status: DC
  Administered 2020-04-08 – 2020-04-13 (×8): 1 [IU] via SUBCUTANEOUS
  Administered 2020-04-14: 2 [IU] via SUBCUTANEOUS
  Filled 2020-04-08: qty 0.09

## 2020-04-08 NOTE — Progress Notes (Signed)
Pharmacy Antibiotic Note  Scott Conley is a 80 y.o. male admitted on 04/07/2020 with postobstructive PNA.  Pharmacy has been consulted for vancomycin and cefepime dosing.  Plan: Cefepime 2 gr IV q8h   Vancomycin 1500 mg IV x1 in ED, then vancomycin  1000 mg IV q12h. Monitor clinical course, renal function, cultures as available      Temp (24hrs), Avg:97.6 F (36.4 C), Min:97.6 F (36.4 C), Max:97.6 F (36.4 C)  Recent Labs  Lab 04/04/20 1143 04/07/20 2006 04/07/20 2325  WBC 7.2 3.1*  --   CREATININE 0.77 0.82  --   LATICACIDVEN  --  2.8* 2.4*    Estimated Creatinine Clearance: 85.1 mL/min (by C-G formula based on SCr of 0.82 mg/dL).    No Known Allergies  Antimicrobials this admission: 9/11 cefepime >>  9/11 vancomycin >>   Dose adjustments this admission:   Microbiology results: 9/10 BCx:  9/10 COVID-19: Negative      Thank you for allowing pharmacy to be a part of this patient's care.  Royetta Asal, PharmD, BCPS 04/08/2020 3:58 AM

## 2020-04-08 NOTE — ED Provider Notes (Signed)
I received this patient in signout from Dr. Francia Greaves.  At time of signout, patient had CTA showing postobstructive pneumonia in the setting of lung cancer and active chemotherapy.  WBC 3.1, lactate 2.8, COVID-19 negative.  Awaiting discussion with hospitalist at time of signout.  Discussed admission with [facility] hospitalist, Dr.Kakrakandy, who will admit for further care.   Scott Conley, Scott Overland, MD 04/08/20 [phone]

## 2020-04-08 NOTE — ED Notes (Signed)
Patient received lunch tray 

## 2020-04-08 NOTE — H&P (Signed)
History and Physical    Scott ASEBEDO IRW:[id number] DOB: Dec 29, 1940 DOA: 04/07/2020  PCP: [facility], FNP  Patient coming from: Home.  Chief Complaint: Shortness of breath.  HPI: Scott [facility] is a 79 y.o. male with history of recently diagnosed non-small cell lung cancer on chemo and radiation presents to the ER with complaint of shortness of breath.  Patient states he has been having increasing shortness of breath since last 24 hours.  He has been having nonproductive cough which worsens his shortness of breath.  Denies chest pain fever chills.  He did receive his chemotherapy yesterday.  Following which he went home became more short of breath and presents to the ER.  ED Course: In the ER patient appears tachypneic and tachycardic.  Labs were significant for hemoglobin of 9.6 and WBC of 3.1 which appears to be at baseline with recent.  D-dimer was mildly elevated EKG was showing sinus tachycardia.  Patient underwent CT angiogram of the chest which was negative for pulmonary embolism but did show features concerning for postobstructive pneumonia and was started on antibiotics admitted for further observation.  Covid test was negative.  Review of Systems: As per HPI, rest all negative.   Past Medical History:  Diagnosis Date  . Aortic atherosclerosis ([facility])   . Arthritis   . Blood clot in vein   . CVA (cerebral infarction)   . Diabetes (Scott [facility])   . DVT (deep venous thrombosis) (Scott [facility])   . Emphysema lung (Scott [facility])   . Hypertension   . Lung cancer (Scott [facility])   . Osteoarthritis   . Stroke Scott [facility])    Left hand stiffness     Past Surgical History:  Procedure Laterality Date  . APPENDECTOMY    . BIOPSY  03/05/2020   Procedure: BIOPSY;  Surgeon: Scott [facility], DO;  Location: [facility];  Service: Pulmonary;;  . CRYOTHERAPY  03/05/2020   Procedure: CRYOTHERAPY;  Surgeon: Scott [facility], DO;  Location: [facility];  Service: Pulmonary;;  . CYST REMOVAL LEG     removed from right  groin  . HEMOSTASIS CONTROL  03/05/2020   Procedure: HEMOSTASIS CONTROL;  Surgeon: [facility], DO;  Location: [facility];  Service: Pulmonary;;  . LOOP RECORDER IMPLANT  11/29/13   MDT LinQ implanted by Dr [facility] for cryptogenic stroke  . LOOP RECORDER IMPLANT N/A 11/29/2013   Procedure: LOOP RECORDER IMPLANT;  Surgeon: Scott Sprang, MD;  Location: [facility];  Service: Cardiovascular;  Laterality: N/A;  . TEE WITHOUT CARDIOVERSION N/A 11/29/2013   Procedure: TRANSESOPHAGEAL ECHOCARDIOGRAM (TEE);  Surgeon: Scott Margarita, MD;  Location: [facility];  Service: Cardiovascular;  Laterality: N/A;  . TONSILLECTOMY    . VIDEO BRONCHOSCOPY WITH ENDOBRONCHIAL ULTRASOUND N/A 03/05/2020   Procedure: VIDEO BRONCHOSCOPY WITH ENDOBRONCHIAL ULTRASOUND;  Surgeon: [facility], DO;  Location: [facility];  Service: Pulmonary;  Laterality: N/A;     reports that he has quit smoking. His smoking use included cigarettes. He started smoking about 6 years ago. He has a 29.00 pack-year smoking history. He has never used smokeless tobacco. He reports that he does not drink alcohol and does not use drugs.  No Known Allergies  Family History  Problem Relation Age of Onset  . Breast cancer Mother   . Breast cancer Maternal Grandmother   . Cirrhosis Brother     Prior to Admission medications   Medication Sig Start Date End Date Taking? Authorizing Provider  aspirin EC 81 MG tablet Take  1 tablet (81 mg total) by mouth daily. Swallow whole. 03/06/20 03/06/21 Yes Hall, Carole N, DO  atorvastatin (LIPITOR) 80 MG tablet Take 1 tablet (80 mg total) by mouth daily. Must keep appt w/new provider for future refills 03/05/20  Yes [facility] N, DO  carvedilol (COREG) 6.25 MG tablet Take 1 tablet (6.25 mg total) by mouth 2 (two) times daily with a meal. 03/05/20  Yes Scott [facility] N, DO  docusate sodium (COLACE) 100 MG capsule Take 200 mg by mouth 2 (two) times daily.   Yes [provider]  sitaGLIPtin (JANUVIA) 100  MG tablet Take 1 tablet (100 mg total) by mouth daily. Must keep appt w/new provider for future refills 06/23/17  Yes Scott Borg, MD  arformoterol (BROVANA) 15 MCG/2ML NEBU Take 2 mLs (15 mcg total) by nebulization 2 (two) times daily. Patient not taking: Reported on 03/20/2020 03/05/20   Scott Memos, DO  bisacodyl (DULCOLAX) 10 MG suppository Place 1 suppository (10 mg total) rectally daily as needed for severe constipation. Patient not taking: Reported on 03/20/2020 03/05/20   Scott Memos, DO  budesonide (PULMICORT) 0.25 MG/2ML nebulizer solution Take 2 mLs (0.25 mg total) by nebulization 2 (two) times daily. Patient not taking: Reported on 03/20/2020 03/05/20   Scott Memos, DO  polyethylene glycol (MIRALAX) 17 g packet Take 17 g by mouth daily as needed for moderate constipation. Patient not taking: Reported on 03/20/2020 03/05/20   Scott Memos, DO  prochlorperazine (COMPAZINE) 10 MG tablet Take 1 tablet (10 mg total) by mouth every 6 (six) hours as needed for nausea or vomiting. 03/20/20   Scott Bears, MD  senna (SENOKOT) 8.6 MG TABS tablet Take 2 tablets (17.2 mg total) by mouth daily as needed for moderate constipation. Patient not taking: Reported on 03/20/2020 03/05/20   Scott Memos, DO    Physical Exam: Constitutional: Moderately built and nourished. Vitals:   04/08/20 0000 04/08/20 0130 04/08/20 0200 04/08/20 0230  BP: 107/80 126/77 (!) 146/86 130/81  Pulse: 95 93 (!) 101 96  Resp: 11 13 (!) 25 17  Temp:      TempSrc:      SpO2: 100% 99% 100% 99%   Eyes: Anicteric no pallor. ENMT: No discharge from the ears eyes nose or mouth. Neck: No mass felt.  No neck rigidity. Respiratory: No rhonchi or crepitations. Cardiovascular: S1-S2 heard. Abdomen: Soft nontender bowel sounds present. Musculoskeletal: No edema. Skin: No rash. Neurologic: Alert awake oriented to time place and person.  Moves all extremities. Psychiatric: Appears normal.  Normal affect.   Labs on  Admission: I have personally reviewed following labs and imaging studies  CBC: Recent Labs  Lab 04/04/20 1143 04/07/20 2006  WBC 7.2 3.1*  NEUTROABS 5.4 2.5  HGB 9.2* 9.6*  HCT 30.9* 32.1*  MCV 77.1* 77.5*  PLT 294 270   Basic Metabolic Panel: Recent Labs  Lab 04/04/20 1143 04/07/20 2006  NA 135 138  K 4.1 4.3  CL 103 103  CO2 22 17*  GLUCOSE 134* 144*  BUN 10 13  CREATININE 0.77 0.82  CALCIUM 8.8* 8.4*   GFR: Estimated Creatinine Clearance: 85.1 mL/min (by C-G formula based on SCr of 0.82 mg/dL). Liver Function Tests: Recent Labs  Lab 04/04/20 1143 04/07/20 2006  AST 9* 18  ALT 12 14  ALKPHOS 65 50  BILITOT 0.9 1.2  PROT 7.1 6.9  ALBUMIN 2.2* 2.7*   No results for input(s): LIPASE, AMYLASE in the last 168 hours.  No results for input(s): AMMONIA in the last 168 hours. Coagulation Profile: Recent Labs  Lab 04/07/20 2006  INR 1.2   Cardiac Enzymes: No results for input(s): CKTOTAL, CKMB, CKMBINDEX, TROPONINI in the last 168 hours. BNP (last 3 results) No results for input(s): PROBNP in the last 8760 hours. HbA1C: No results for input(s): HGBA1C in the last 72 hours. CBG: No results for input(s): GLUCAP in the last 168 hours. Lipid Profile: No results for input(s): CHOL, HDL, LDLCALC, TRIG, CHOLHDL, LDLDIRECT in the last 72 hours. Thyroid Function Tests: No results for input(s): TSH, T4TOTAL, FREET4, T3FREE, THYROIDAB in the last 72 hours. Anemia Panel: No results for input(s): VITAMINB12, FOLATE, FERRITIN, TIBC, IRON, RETICCTPCT in the last 72 hours. Urine analysis:    Component Value Date/Time   COLORURINE YELLOW 04/07/2020 0155   APPEARANCEUR CLEAR 04/07/2020 0155   LABSPEC >1.046 (H) 04/07/2020 0155   PHURINE 6.0 04/07/2020 0155   GLUCOSEU NEGATIVE 04/07/2020 0155   HGBUR NEGATIVE 04/07/2020 0155   BILIRUBINUR NEGATIVE 04/07/2020 0155   KETONESUR 5 (A) 04/07/2020 0155   PROTEINUR NEGATIVE 04/07/2020 0155   UROBILINOGEN 1.0 11/26/2013 1757     NITRITE NEGATIVE 04/07/2020 0155   LEUKOCYTESUR NEGATIVE 04/07/2020 0155   Sepsis Labs: @LABRCNTIP (procalcitonin:4,lacticidven:4) ) Recent Results (from the past 240 hour(s))  SARS Coronavirus 2 by RT PCR ([facility] order, performed in [place] [facility]) Nasopharyngeal Nasopharyngeal Swab     Status: None   Collection Time: 04/07/20  8:15 PM   Specimen: Nasopharyngeal Swab  Result Value Ref Range Status   SARS Coronavirus 2 NEGATIVE NEGATIVE Final    Comment: (NOTE) SARS-CoV-2 target nucleic acids are NOT DETECTED.  The SARS-CoV-2 RNA is generally detectable in upper and lower respiratory specimens during the acute phase of infection. The lowest concentration of SARS-CoV-2 viral copies this assay can detect is 250 copies / mL. A negative result does not preclude SARS-CoV-2 infection and should not be used as the sole basis for treatment or other patient management decisions.  A negative result may occur with improper specimen collection / handling, submission of specimen other than nasopharyngeal swab, presence of viral mutation(s) within the areas targeted by this assay, and inadequate number of viral copies (<250 copies / mL). A negative result must be combined with clinical observations, patient history, and epidemiological information.  Fact Sheet for Patients:   StrictlyIdeas.no  Fact Sheet for Healthcare Providers: BankingDealers.co.za  This test is not yet approved or  cleared by the [place] FDA and has been authorized for detection and/or diagnosis of SARS-CoV-2 by FDA under an Emergency Use Authorization (EUA).  This EUA will remain in effect (meaning this test can be used) for the duration of the COVID-19 declaration under Section 564(b)(1) of the Act, 21 U.S.C. section 360bbb-3(b)(1), unless the authorization is terminated or revoked sooner.  Performed at [facility], [place] [address], [place], [place] [address]      Radiological Exams on Admission: CT Angio Chest PE W and/or Wo Contrast  Result Date: 04/07/2020 CLINICAL DATA:  Positive D-dimer. Suspected pulmonary embolus with low to intermediate probability. Diagnosis of stage III lung cancer with radiation today. EXAM: CT ANGIOGRAPHY CHEST WITH CONTRAST TECHNIQUE: Multidetector CT imaging of the chest was performed using the standard protocol during bolus administration of intravenous contrast. Multiplanar CT image reconstructions and MIPs were obtained to evaluate the vascular anatomy. CONTRAST:  133mL OMNIPAQUE IOHEXOL 350 MG/ML SOLN COMPARISON:  03/04/2020 FINDINGS: Cardiovascular: There is good opacification of the central  and segmental pulmonary arteries. No focal filling defects. No evidence of significant pulmonary embolus. Normal heart size. No pericardial effusions. Normal caliber thoracic aorta. Scattered aortic and coronary artery calcifications. Mediastinum/Nodes: Right hilar lymphadenopathy and right hilar mass as seen on previous studies. The mass measures about 6.6 x 6.5 cm in diameter. Small esophageal hiatal hernia. Esophagus is decompressed. Lungs/Pleura: Emphysematous changes in the lungs. Scarring and pleural thickening in the left apex. Nodular scarring with spiculation in the left apex measures about 11 mm in diameter. Neoplasm is not excluded. Appearance is unchanged since prior study. Right hilar mass as previously indicated.Postobstructive pneumonia and atelectasis in the right lower lung medially and in the right middle lung. Upper Abdomen: No acute process demonstrated in the visualized upper abdominal contents. Musculoskeletal: No destructive bone lesions. Review of the MIP images confirms the above findings. IMPRESSION: 1. No evidence of significant pulmonary embolus. 2. Right hilar lymphadenopathy and right hilar mass as seen on previous studies. Postobstructive pneumonia and atelectasis in the right lower  lung medially and in the right middle lung. 3. Nodular scarring with spiculation in the left apex measures about 11 mm in diameter. Neoplasm is not excluded. 4. Emphysematous changes in the lungs. 5. Small esophageal hiatal hernia. 6. Emphysema and aortic atherosclerosis. Aortic Atherosclerosis (ICD10-I70.0) and Emphysema (ICD10-J43.9). Electronically Signed   By: Lucienne Capers M.D.   On: 04/07/2020 23:19   DG Chest Port 1 View  Result Date: 04/07/2020 CLINICAL DATA:  Stage III right lower lobe non-small cell lung cancer, dyspnea, cough EXAM: PORTABLE CHEST 1 VIEW COMPARISON:  03/04/2020 FINDINGS: 2 frontal views of the chest demonstrate right infrahilar mass unchanged since prior study. No new consolidation, effusion, or pneumothorax. Cardiac silhouette is stable. Loop recorder unchanged. IMPRESSION: 1. Stable right infrahilar mass. 2. No acute airspace disease. Electronically Signed   By: Randa Ngo M.D.   On: 04/07/2020 20:51    EKG: Independently reviewed.  Sinus tachycardia.  Assessment/Plan Principal Problem:   Postobstructive pneumonia Active Problems:   Hypertension   Malignant neoplasm of bronchus of right lower lobe (HCC)   Controlled type 2 diabetes mellitus with hyperglycemia ([place])    1. Acute respiratory failure likely from postobstructive pneumonia and also component of emphysema.  For which we will continue with antibiotics I will add on nebulizer and Pulmicort.  Closely monitor. 2. Diabetes mellitus type 2 on sliding scale coverage. 3. Anemia likely from chemotherapy appears to be at baseline follow CBC. 4. Hypertension on Coreg. 5. Hyperlipidemia on statins. 6. Stage III non-small cell lung cancer being followed by oncologist.   DVT prophylaxis: Lovenox. Code Status: Full code. Family Communication: Discussed with patient. Disposition Plan: Home. Consults called: None. Admission status: Observation.   Rise Patience MD [facility] Pager [phone].  If 7PM-7AM, please contact night-coverage [email or web address] Password [facility]  04/08/2020, 3:05 AM

## 2020-04-08 NOTE — Progress Notes (Signed)
Patient ID: [facility], male   DOB: [birth year], 79 y.o.   MRN: [id number] Patient admitted early this morning for shortness of breath with CT angiogram showing postoperative pneumonia.  He has been started on IV antibiotics.  Patient seen and examined at bedside and plan of care discussed with him.  I have reviewed patient's medical records including this morning's H&P, current vitals, labs and medications myself.  Continue antibiotics.  Follow cultures.

## 2020-04-09 ENCOUNTER — Inpatient Hospital Stay (HOSPITAL_COMMUNITY): Payer: Medicare HMO

## 2020-04-09 DIAGNOSIS — Z7189 Other specified counseling: Secondary | ICD-10-CM

## 2020-04-09 DIAGNOSIS — R634 Abnormal weight loss: Secondary | ICD-10-CM

## 2020-04-09 DIAGNOSIS — R933 Abnormal findings on diagnostic imaging of other parts of digestive tract: Secondary | ICD-10-CM

## 2020-04-09 DIAGNOSIS — Z515 Encounter for palliative care: Secondary | ICD-10-CM

## 2020-04-09 LAB — GLUCOSE, CAPILLARY
Glucose-Capillary: 108 mg/dL — ABNORMAL HIGH (ref 70–99)
Glucose-Capillary: 129 mg/dL — ABNORMAL HIGH (ref 70–99)
Glucose-Capillary: 143 mg/dL — ABNORMAL HIGH (ref 70–99)
Glucose-Capillary: 116 mg/dL — ABNORMAL HIGH (ref 70–99)

## 2020-04-09 LAB — CBC WITH DIFFERENTIAL/PLATELET
Abs Immature Granulocytes: 0.01 10*3/uL (ref 0.00–0.07)
Basophils Absolute: 0 10*3/uL (ref 0.0–0.1)
Basophils Relative: 1 %
Eosinophils Absolute: 0.1 10*3/uL (ref 0.0–0.5)
Eosinophils Relative: 5 %
HCT: 26 % — ABNORMAL LOW (ref 39.0–52.0)
Hemoglobin: 7.9 g/dL — ABNORMAL LOW (ref 13.0–17.0)
Immature Granulocytes: 1 %
Lymphocytes Relative: 20 %
Lymphs Abs: 0.4 10*3/uL — ABNORMAL LOW (ref 0.7–4.0)
MCH: 23.4 pg — ABNORMAL LOW (ref 26.0–34.0)
MCHC: 30.4 g/dL (ref 30.0–36.0)
MCV: 76.9 fL — ABNORMAL LOW (ref 80.0–100.0)
Monocytes Absolute: 0.1 10*3/uL (ref 0.1–1.0)
Monocytes Relative: 4 %
Neutro Abs: 1.5 10*3/uL — ABNORMAL LOW (ref 1.7–7.7)
Neutrophils Relative %: 69 %
Platelets: 190 10*3/uL (ref 150–400)
RBC: 3.38 MIL/uL — ABNORMAL LOW (ref 4.22–5.81)
RDW: 21.7 % — ABNORMAL HIGH (ref 11.5–15.5)
WBC Morphology: INCREASED
WBC: 2.2 10*3/uL — ABNORMAL LOW (ref 4.0–10.5)
nRBC: 0 % (ref 0.0–0.2)

## 2020-04-09 LAB — BASIC METABOLIC PANEL
Anion gap: 11 (ref 5–15)
BUN: 10 mg/dL (ref 8–23)
CO2: 19 mmol/L — ABNORMAL LOW (ref 22–32)
Calcium: 7.8 mg/dL — ABNORMAL LOW (ref 8.9–10.3)
Chloride: 105 mmol/L (ref 98–111)
Creatinine, Ser: 0.49 mg/dL — ABNORMAL LOW (ref 0.61–1.24)
GFR calc Af Amer: >60 mL/min (ref >60)
GFR calc non Af Amer: >60 mL/min (ref >60)
Glucose, Bld: 115 mg/dL — ABNORMAL HIGH (ref 70–99)
Potassium: 3.3 mmol/L — ABNORMAL LOW (ref 3.5–5.1)
Sodium: 135 mmol/L (ref 135–145)

## 2020-04-09 LAB — PROCALCITONIN: Procalcitonin: 0.16 ng/mL

## 2020-04-09 LAB — MAGNESIUM: Magnesium: 2 mg/dL (ref 1.7–2.4)

## 2020-04-09 MED ORDER — POTASSIUM CHLORIDE CRYS ER 20 MEQ PO TBCR
40.0000 meq | EXTENDED_RELEASE_TABLET | ORAL | Status: AC
  Administered 2020-04-09 (×2): 40 meq via ORAL
  Filled 2020-04-09 (×2): qty 2

## 2020-04-09 MED ORDER — PANTOPRAZOLE SODIUM 40 MG PO TBEC
40.0000 mg | DELAYED_RELEASE_TABLET | Freq: Every day | ORAL | Status: DC
  Administered 2020-04-09 – 2020-04-10 (×2): 40 mg via ORAL
  Filled 2020-04-09 (×2): qty 1

## 2020-04-09 NOTE — Progress Notes (Signed)
Patient is refusing to eat his meals stating, "I cannot swallow them, they do not taste right". Tried giving patient ensure x2 times and patient has been able to drink the ensures without any issues.

## 2020-04-09 NOTE — Progress Notes (Signed)
Patient ID: Scott Conley, male   DOB: April 29, 1941, 79 y.o.   MRN: [id number]  PROGRESS NOTE    Scott Conley  HEN:[id number] DOB: 1941-03-01 DOA: 04/07/2020 PCP: [place] [facility], FNP   Brief Narrative:  79 y.o. male with history of recently diagnosed non-small cell lung cancer on chemo and radiation presented to the ER with complaint of shortness of breath.  On presentation, he was tachypneic and tachycardic.  CT angiogram of the chest was negative for pulmonary embolism but did show features concerning for postobstructive pneumonia and was started on antibiotics.  COVID-19 test was negative.  Assessment & Plan:   Postobstructive pneumonia Acute hypoxic respiratory failure has been ruled out -Currently on cefepime and vancomycin.  Cultures negative so far.  DC vancomycin.  COVID-19 testing negative on admission. -Currently on room air. -SLP evaluation  Non-small cell lung cancer -Recently diagnosed; on chemo and radiation.  Outpatient follow-up with oncology. -Palliative care consultation for goals of care discussion.  Confusion -Daughter's report the patient has been more confused lately as per nursing staff.  Will get CT of the brain without contrast.  Monitor mental status.  Fall precautions.  PT eval.  Check vitamin B12, folate, TSH and ammonia level in a.m.  Hypertension--continue Coreg.  Hyperlipidemia -Continue statin  Anemia of chronic disease -Likely from cancer and chemo.  Stable hemoglobin  Hypokalemia -Replace.  Repeat a.m. labs   diabetes mellitus type 2 -Continue CBGs with SSI    DVT prophylaxis: Lovenox Code Status: Full Family Communication: Patient at bedside Disposition Plan: Status is: Inpatient  Remains inpatient appropriate because:Inpatient level of care appropriate due to severity of illness   Dispo: The patient is from: Home              Anticipated d/c is to: Home              Anticipated d/c date is: 1 day              Patient  currently is not medically stable to d/c.    Consultants: None  Procedures: None  Antimicrobials: Cefepime and vancomycin from 04/07/2020 onwards   Subjective: Patient seen and examined at bedside.  He feels slightly better but still weak and short of breath with exertion.  No overnight fever, vomiting abdominal pain or diarrhea.  Does not feel ready for discharge yet.  Objective: Vitals:   04/08/20 1921 04/08/20 1949 04/09/20 0332 04/09/20 0606  BP:  136/86 (!) 156/82   Pulse:  83 82   Resp:  18 18   Temp:  97.7 F (36.5 C) (!) 97.5 F (36.4 C)   TempSrc:  Oral Oral   SpO2: 97% 90% 95% 98%    Intake/Output Summary (Last 24 hours) at 04/09/2020 1032 Last data filed at 04/09/2020 0330 Gross per 24 hour  Intake 480 ml  Output --  Net 480 ml   There were no vitals filed for this visit.  Examination:  General exam: Appears calm and comfortable.  Elderly male lying in bed.  Currently on room air. Respiratory system: Bilateral decreased breath sounds at bases with scattered crackles Cardiovascular system: S1 & S2 heard, Rate controlled Gastrointestinal system: Abdomen is nondistended, soft and nontender. Normal bowel sounds heard. Extremities: No cyanosis, clubbing, edema  Central nervous system: Alert and oriented.  Poor historian.  No focal neurological deficits. Moving extremities Skin: No rashes, lesions or ulcers Psychiatry: Flat affect.   Data Reviewed: I have personally reviewed following labs and imaging  studies  CBC: Recent Labs  Lab 04/04/20 1143 04/07/20 2006 04/08/20 0416 04/09/20 0649  WBC 7.2 3.1* 3.3* 2.2*  NEUTROABS 5.4 2.5  --  1.5*  HGB 9.2* 9.6* 8.7* 7.9*  HCT 30.9* 32.1* 28.4* 26.0*  MCV 77.1* 77.5* 76.1* 76.9*  PLT 294 240 220 250   Basic Metabolic Panel: Recent Labs  Lab 04/04/20 1143 04/07/20 2006 04/08/20 0416 04/09/20 0649  NA 135 138 139 135  K 4.1 4.3 4.0 3.3*  CL 103 103 106 105  CO2 22 17* 16* 19*  GLUCOSE 134* 144* 141*  115*  BUN 10 13 13 10   CREATININE 0.77 0.82 0.64 0.49*  CALCIUM 8.8* 8.4* 7.5* 7.8*  MG  --   --   --  2.0   GFR: Estimated Creatinine Clearance: 87.2 mL/min (A) (by C-G formula based on SCr of 0.49 mg/dL (L)). Liver Function Tests: Recent Labs  Lab 04/04/20 1143 04/07/20 2006  AST 9* 18  ALT 12 14  ALKPHOS 65 50  BILITOT 0.9 1.2  PROT 7.1 6.9  ALBUMIN 2.2* 2.7*   No results for input(s): LIPASE, AMYLASE in the last 168 hours. No results for input(s): AMMONIA in the last 168 hours. Coagulation Profile: Recent Labs  Lab 04/07/20 2006  INR 1.2   Cardiac Enzymes: No results for input(s): CKTOTAL, CKMB, CKMBINDEX, TROPONINI in the last 168 hours. BNP (last 3 results) No results for input(s): PROBNP in the last 8760 hours. HbA1C: No results for input(s): HGBA1C in the last 72 hours. CBG: Recent Labs  Lab 04/08/20 1243 04/08/20 1706 04/08/20 2003 04/08/20 2136 04/09/20 0806  GLUCAP 115* 125* 110* 105* 108*   Lipid Profile: No results for input(s): CHOL, HDL, LDLCALC, TRIG, CHOLHDL, LDLDIRECT in the last 72 hours. Thyroid Function Tests: No results for input(s): TSH, T4TOTAL, FREET4, T3FREE, THYROIDAB in the last 72 hours. Anemia Panel: No results for input(s): VITAMINB12, FOLATE, FERRITIN, TIBC, IRON, RETICCTPCT in the last 72 hours. Sepsis Labs: Recent Labs  Lab 04/07/20 2006 04/07/20 2325 04/09/20 0649  PROCALCITON  --   --  0.16  LATICACIDVEN 2.8* 2.4*  --     Recent Results (from the past 240 hour(s))  SARS Coronavirus 2 by RT PCR (hospital order, performed in [facility] hospital lab) Nasopharyngeal Nasopharyngeal Swab     Status: None   Collection Time: 04/07/20  8:15 PM   Specimen: Nasopharyngeal Swab  Result Value Ref Range Status   SARS Coronavirus 2 NEGATIVE NEGATIVE Final    Comment: (NOTE) SARS-CoV-2 target nucleic acids are NOT DETECTED.  The SARS-CoV-2 RNA is generally detectable in upper and lower respiratory specimens during the acute  phase of infection. The lowest concentration of SARS-CoV-2 viral copies this assay can detect is 250 copies / mL. A negative result does not preclude SARS-CoV-2 infection and should not be used as the sole basis for treatment or other patient management decisions.  A negative result may occur with improper specimen collection / handling, submission of specimen other than nasopharyngeal swab, presence of viral mutation(s) within the areas targeted by this assay, and inadequate number of viral copies (<250 copies / mL). A negative result must be combined with clinical observations, patient history, and epidemiological information.  Fact Sheet for Patients:   StrictlyIdeas.no  Fact Sheet for Healthcare Providers: BankingDealers.co.za  This test is not yet approved or  cleared by the [place] FDA and has been authorized for detection and/or diagnosis of SARS-CoV-2 by FDA under an Emergency Use Authorization (EUA).  This EUA will remain in effect (meaning this test can be used) for the duration of the COVID-19 declaration under Section 564(b)(1) of the Act, 21 U.S.C. section 360bbb-3(b)(1), unless the authorization is terminated or revoked sooner.  Performed at [facility], [place] [address] [place], [place] [address]          Radiology Studies: CT Angio Chest PE W and/or Wo Contrast  Result Date: 04/07/2020 CLINICAL DATA:  Positive D-dimer. Suspected pulmonary embolus with low to intermediate probability. Diagnosis of stage III lung cancer with radiation today. EXAM: CT ANGIOGRAPHY CHEST WITH CONTRAST TECHNIQUE: Multidetector CT imaging of the chest was performed using the standard protocol during bolus administration of intravenous contrast. Multiplanar CT image reconstructions and MIPs were obtained to evaluate the vascular anatomy. CONTRAST:  158mL OMNIPAQUE IOHEXOL 350 MG/ML SOLN COMPARISON:  03/04/2020  FINDINGS: Cardiovascular: There is good opacification of the central and segmental pulmonary arteries. No focal filling defects. No evidence of significant pulmonary embolus. Normal heart size. No pericardial effusions. Normal caliber thoracic aorta. Scattered aortic and coronary artery calcifications. Mediastinum/Nodes: Right hilar lymphadenopathy and right hilar mass as seen on previous studies. The mass measures about 6.6 x 6.5 cm in diameter. Small esophageal hiatal hernia. Esophagus is decompressed. Lungs/Pleura: Emphysematous changes in the lungs. Scarring and pleural thickening in the left apex. Nodular scarring with spiculation in the left apex measures about 11 mm in diameter. Neoplasm is not excluded. Appearance is unchanged since prior study. Right hilar mass as previously indicated.Postobstructive pneumonia and atelectasis in the right lower lung medially and in the right middle lung. Upper Abdomen: No acute process demonstrated in the visualized upper abdominal contents. Musculoskeletal: No destructive bone lesions. Review of the MIP images confirms the above findings. IMPRESSION: 1. No evidence of significant pulmonary embolus. 2. Right hilar lymphadenopathy and right hilar mass as seen on previous studies. Postobstructive pneumonia and atelectasis in the right lower lung medially and in the right middle lung. 3. Nodular scarring with spiculation in the left apex measures about 11 mm in diameter. Neoplasm is not excluded. 4. Emphysematous changes in the lungs. 5. Small esophageal hiatal hernia. 6. Emphysema and aortic atherosclerosis. Aortic Atherosclerosis (ICD10-I70.0) and Emphysema (ICD10-J43.9). Electronically Signed   By: Lucienne Capers M.D.   On: 04/07/2020 23:19   DG Chest Port 1 View  Result Date: 04/07/2020 CLINICAL DATA:  Stage III right lower lobe non-small cell lung cancer, dyspnea, cough EXAM: PORTABLE CHEST 1 VIEW COMPARISON:  03/04/2020 FINDINGS: 2 frontal views of the chest  demonstrate right infrahilar mass unchanged since prior study. No new consolidation, effusion, or pneumothorax. Cardiac silhouette is stable. Loop recorder unchanged. IMPRESSION: 1. Stable right infrahilar mass. 2. No acute airspace disease. Electronically Signed   By: Randa Ngo M.D.   On: 04/07/2020 20:51        Scheduled Meds: . albuterol  2.5 mg Nebulization TID  . aspirin EC  81 mg Oral Daily  . atorvastatin  80 mg Oral Daily  . budesonide (PULMICORT) nebulizer solution  0.25 mg Nebulization BID  . carvedilol  6.25 mg Oral BID WC  . docusate sodium  200 mg Oral BID  . enoxaparin (LOVENOX) injection  40 mg Subcutaneous Q24H  . insulin aspart  0-9 Units Subcutaneous TID WC  . potassium chloride  40 mEq Oral Q4H   Continuous Infusions: . ceFEPime (MAXIPIME) IV 2 g (04/09/20 0722)  . vancomycin 1,000 mg (04/09/20 0000)          Cienna Dumais Starla Link,  MD [facility] 04/09/2020, 10:32 AM

## 2020-04-09 NOTE — Evaluation (Signed)
Clinical/Bedside Swallow Evaluation Patient Details  Name: Scott Conley MRN: [id number] Date of Birth: 03-30-1941  Today's Date: 04/09/2020 Time: SLP Start Time (ACUTE ONLY): 44 SLP Stop Time (ACUTE ONLY): 1650 SLP Time Calculation (min) (ACUTE ONLY): 20 min  Past Medical History:  Past Medical History:  Diagnosis Date  . Aortic atherosclerosis ([place])   . Arthritis   . Blood clot in vein   . CVA (cerebral infarction)   . Diabetes ([place])   . DVT (deep venous thrombosis) ([place])   . Emphysema lung ([place])   . Hypertension   . Lung cancer ([place])   . Osteoarthritis   . Stroke [facility])    Left hand stiffness    Past Surgical History:  Past Surgical History:  Procedure Laterality Date  . APPENDECTOMY    . BIOPSY  03/05/2020   Procedure: BIOPSY;  Surgeon: Garner Nash, DO;  Location: [place] ENDOSCOPY;  Service: Pulmonary;;  . CRYOTHERAPY  03/05/2020   Procedure: CRYOTHERAPY;  Surgeon: Garner Nash, DO;  Location: [place] ENDOSCOPY;  Service: Pulmonary;;  . CYST REMOVAL LEG     removed from right groin  . HEMOSTASIS CONTROL  03/05/2020   Procedure: HEMOSTASIS CONTROL;  Surgeon: Garner Nash, DO;  Location: [place] ENDOSCOPY;  Service: Pulmonary;;  . LOOP RECORDER IMPLANT  11/29/13   MDT LinQ implanted by Dr Caryl Comes for cryptogenic stroke  . LOOP RECORDER IMPLANT N/A 11/29/2013   Procedure: LOOP RECORDER IMPLANT;  Surgeon: Deboraha Sprang, MD;  Location:  [facility] CATH LAB;  Service: Cardiovascular;  Laterality: N/A;  . TEE WITHOUT CARDIOVERSION N/A 11/29/2013   Procedure: TRANSESOPHAGEAL ECHOCARDIOGRAM (TEE);  Surgeon: Sueanne Margarita, MD;  Location: [facility] ENDOSCOPY;  Service: Cardiovascular;  Laterality: N/A;  . TONSILLECTOMY    . VIDEO BRONCHOSCOPY WITH ENDOBRONCHIAL ULTRASOUND N/A 03/05/2020   Procedure: VIDEO BRONCHOSCOPY WITH ENDOBRONCHIAL ULTRASOUND;  Surgeon: Garner Nash, DO;  Location: [place];  Service: Pulmonary;  Laterality: N/A;   HPI:  Patient is an 79 y.o. male with recent diagnosis of  non-small cell lung cancer, on chemo and radiation, who presented to ER with complaint of SOB. He was tachypneic and tachycardic on presentation; CT angiogram on chest was negative for PE but did show features concerning for postobstructive PNA and so he was started on antibiotics. Covid-19 test negative. PMH: HTN, hypokalemia, DM-2 HLD   Assessment / Plan / Recommendation Clinical Impression  Patient presents with what appears to be a primary pharyngeal-esophageal dysphagia based largely on medical history and patient's report of globus sensation, belching and difficulty with swallowing solids. Patient was very apprehensive and only accepted a small amount of solids (very small bite of gelatin and two small bites of applesauce.) He would benefit from at least one diet ck/patient education regarding PO intake and swallow function management. SLP Visit Diagnosis: Dysphagia, unspecified (R13.10)    Aspiration Risk  No limitations    Diet Recommendation Regular;Thin liquid   Liquid Administration via: Straw;Cup Medication Administration: Whole meds with liquid Supervision: Patient able to self feed Postural Changes: Seated upright at 90 degrees;Remain upright for at least 30 minutes after po intake    Other  Recommendations Oral Care Recommendations: Oral care BID   Follow up Recommendations None      Frequency and Duration min 1 x/week  1 week       Prognosis Prognosis for Safe Diet Advancement: Fair Barriers to Reach Goals: Other (Comment) Barriers/Prognosis Comment: patient's wilingness to try diffierent PO textures  Swallow Study   General Date of Onset: 04/08/20 HPI: Patient is an 79 y.o. male with recent diagnosis of non-small cell lung cancer, on chemo and radiation, who presented to ER with complaint of SOB. He was tachypneic and tachycardic on presentation; CT angiogram on chest was negative for PE but did show features concerning for postobstructive PNA and so he was  started on antibiotics. Covid-19 test negative. PMH: HTN, hypokalemia, DM-2 HLD Type of Study: Bedside Swallow Evaluation Previous Swallow Assessment: N/A Diet Prior to this Study: Regular;Thin liquids Temperature Spikes Noted: No Respiratory Status: Room air History of Recent Intubation: No Behavior/Cognition: Alert;Cooperative;Pleasant mood Oral Cavity Assessment: Within Functional Limits Oral Care Completed by SLP: Recent completion by staff Oral Cavity - Dentition: Dentures, not available;Edentulous;Poor condition (bottom teeth present, edentulous top and dentures not present) Vision: Functional for self-feeding Self-Feeding Abilities: Able to feed self Patient Positioning: Upright in bed Baseline Vocal Quality: Hoarse Volitional Cough: Strong Volitional Swallow: Able to elicit    Oral/Motor/Sensory Function Overall Oral Motor/Sensory Function: Within functional limits   Ice Chips     Thin Liquid Thin Liquid: Within functional limits Presentation: Straw Other Comments: No overt s/s of aspiration or penetration observed    Nectar Thick     Honey Thick     Puree Puree: Within functional limits Pharyngeal Phase Impairments: Other (comments)   Solid     Solid: Not tested     Sonia Baller, MA, CCC-SLP 04/09/20 5:50 PM

## 2020-04-10 ENCOUNTER — Inpatient Hospital Stay: Payer: Medicare HMO

## 2020-04-10 ENCOUNTER — Ambulatory Visit (HOSPITAL_COMMUNITY): Admission: RE | Admit: 2020-04-10 | Payer: Medicare HMO | Source: Ambulatory Visit

## 2020-04-10 ENCOUNTER — Ambulatory Visit
Admission: RE | Admit: 2020-04-10 | Discharge: 2020-04-10 | Disposition: A | Discharge status: Home / Self Care | Payer: Medicare HMO | Source: Ambulatory Visit | Attending: Radiation Oncology | Admitting: Radiation Oncology

## 2020-04-10 ENCOUNTER — Encounter: Payer: Medicare HMO | Admitting: Nutrition

## 2020-04-10 ENCOUNTER — Inpatient Hospital Stay: Payer: Medicare HMO | Admitting: Physician Assistant

## 2020-04-10 ENCOUNTER — Inpatient Hospital Stay: Payer: Medicare HMO | Admitting: Nutrition

## 2020-04-10 DIAGNOSIS — C3431 Malignant neoplasm of lower lobe, right bronchus or lung: Secondary | ICD-10-CM | POA: Diagnosis not present

## 2020-04-10 DIAGNOSIS — R410 Disorientation, unspecified: Secondary | ICD-10-CM

## 2020-04-10 LAB — COMPREHENSIVE METABOLIC PANEL
ALT: 14 U/L (ref 0–44)
AST: 15 U/L (ref 15–41)
Albumin: 2.2 g/dL — ABNORMAL LOW (ref 3.5–5.0)
Alkaline Phosphatase: 47 U/L (ref 38–126)
Anion gap: 10 (ref 5–15)
BUN: 12 mg/dL (ref 8–23)
CO2: 19 mmol/L — ABNORMAL LOW (ref 22–32)
Calcium: 7.9 mg/dL — ABNORMAL LOW (ref 8.9–10.3)
Chloride: 110 mmol/L (ref 98–111)
Creatinine, Ser: 0.66 mg/dL (ref 0.61–1.24)
GFR calc Af Amer: >60 mL/min (ref >60)
GFR calc non Af Amer: >60 mL/min (ref >60)
Glucose, Bld: 128 mg/dL — ABNORMAL HIGH (ref 70–99)
Potassium: 3.7 mmol/L (ref 3.5–5.1)
Sodium: 139 mmol/L (ref 135–145)
Total Bilirubin: 1 mg/dL (ref 0.3–1.2)
Total Protein: 6 g/dL — ABNORMAL LOW (ref 6.5–8.1)

## 2020-04-10 LAB — CBC WITH DIFFERENTIAL/PLATELET
Abs Immature Granulocytes: 0.02 10*3/uL (ref 0.00–0.07)
Basophils Absolute: 0 10*3/uL (ref 0.0–0.1)
Basophils Relative: 1 %
Eosinophils Absolute: 0.1 10*3/uL (ref 0.0–0.5)
Eosinophils Relative: 3 %
HCT: 26.1 % — ABNORMAL LOW (ref 39.0–52.0)
Hemoglobin: 7.8 g/dL — ABNORMAL LOW (ref 13.0–17.0)
Immature Granulocytes: 1 %
Lymphocytes Relative: 20 %
Lymphs Abs: 0.5 10*3/uL — ABNORMAL LOW (ref 0.7–4.0)
MCH: 23 pg — ABNORMAL LOW (ref 26.0–34.0)
MCHC: 29.9 g/dL — ABNORMAL LOW (ref 30.0–36.0)
MCV: 77 fL — ABNORMAL LOW (ref 80.0–100.0)
Monocytes Absolute: 0.1 10*3/uL (ref 0.1–1.0)
Monocytes Relative: 5 %
Neutro Abs: 1.6 10*3/uL — ABNORMAL LOW (ref 1.7–7.7)
Neutrophils Relative %: 70 %
Platelets: 203 10*3/uL (ref 150–400)
RBC: 3.39 MIL/uL — ABNORMAL LOW (ref 4.22–5.81)
RDW: 21.9 % — ABNORMAL HIGH (ref 11.5–15.5)
WBC: 2.3 10*3/uL — ABNORMAL LOW (ref 4.0–10.5)
nRBC: 0 % (ref 0.0–0.2)

## 2020-04-10 LAB — GLUCOSE, CAPILLARY
Glucose-Capillary: 123 mg/dL — ABNORMAL HIGH (ref 70–99)
Glucose-Capillary: 121 mg/dL — ABNORMAL HIGH (ref 70–99)
Glucose-Capillary: 118 mg/dL — ABNORMAL HIGH (ref 70–99)
Glucose-Capillary: 108 mg/dL — ABNORMAL HIGH (ref 70–99)

## 2020-04-10 LAB — VITAMIN B12: Vitamin B-12: 650 pg/mL (ref 180–914)

## 2020-04-10 LAB — AMMONIA: Ammonia: 15 umol/L (ref 9–35)

## 2020-04-10 LAB — TSH: TSH: 0.959 u[IU]/mL (ref 0.350–4.500)

## 2020-04-10 LAB — FOLATE: Folate: 1.9 ng/mL — ABNORMAL LOW (ref >5.9)

## 2020-04-10 LAB — MAGNESIUM: Magnesium: 2.1 mg/dL (ref 1.7–2.4)

## 2020-04-10 MED ORDER — ENSURE ENLIVE PO LIQD
237.0000 mL | Freq: Two times a day (BID) | ORAL | Status: DC
  Administered 2020-04-11 – 2020-04-15 (×4): 237 mL via ORAL

## 2020-04-10 MED ORDER — ENOXAPARIN SODIUM 40 MG/0.4ML ~~LOC~~ SOLN: 40.0000 mg | SUBCUTANEOUS | Status: DC

## 2020-04-10 MED ORDER — FOLIC ACID 1 MG PO TABS
1.0000 mg | ORAL_TABLET | Freq: Every day | ORAL | Status: DC
  Administered 2020-04-10 – 2020-04-15 (×6): 1 mg via ORAL
  Filled 2020-04-10 (×6): qty 1

## 2020-04-10 MED ORDER — INFLUENZA VAC A&B SA ADJ QUAD 0.5 ML IM PRSY
0.5000 mL | PREFILLED_SYRINGE | INTRAMUSCULAR | Status: DC
  Filled 2020-04-10: qty 0.5

## 2020-04-10 MED ORDER — ALBUTEROL SULFATE (2.5 MG/3ML) 0.083% IN NEBU
2.5000 mg | INHALATION_SOLUTION | Freq: Two times a day (BID) | RESPIRATORY_TRACT | Status: DC
  Administered 2020-04-11 – 2020-04-15 (×8): 2.5 mg via RESPIRATORY_TRACT
  Filled 2020-04-10 (×8): qty 3

## 2020-04-10 MED ORDER — ADULT MULTIVITAMIN W/MINERALS CH
1.0000 | ORAL_TABLET | Freq: Every day | ORAL | Status: DC
  Administered 2020-04-10 – 2020-04-15 (×6): 1 via ORAL
  Filled 2020-04-10 (×6): qty 1

## 2020-04-10 NOTE — Progress Notes (Signed)
Initial Nutrition Assessment  INTERVENTION:   -Ensure Enlive po BID, each supplement provides 350 kcal and 20 grams of protein -Magic cup BID with meals, each supplement provides 290 kcal and 9 grams of protein -Multivitamin with minerals daily -Placed order for lunch  -Recommend liberalized diet given poor POs and reported decreased swallowing ability  NUTRITION DIAGNOSIS:   Increased nutrient needs related to cancer and cancer related treatments as evidenced by estimated needs.  GOAL:   Patient will meet greater than or equal to 90% of their needs  MONITOR:   PO intake, Supplement acceptance, Labs, Weight trends, I & O's  REASON FOR ASSESSMENT:   Consult Assessment of nutrition requirement/status  ASSESSMENT:   79 y.o. male with history of recently diagnosed non-small cell lung cancer on chemo and radiation presented to the ER with complaint of shortness of breath.  On presentation, he was tachypneic and tachycardic.  CT angiogram of the chest was negative for pulmonary embolism but did show features concerning for postobstructive pneumonia and was started on antibiotics.  Patient reports he has not been eating well over the past month. States he is unable to swallow solid foods that well but liquids he can swallow fine. Pt also reports problems with chewing but states he does not wish to have chopped foods. States he would like liquids for lunch today. Did not have anything for breakfast this morning.   RD placed order for some soup, ice cream and pudding. Pt is drinking Ensure supplements as well.   Pt reports his UBW is >200 lbs. Per weight records, pt has lost 18 lbs since 8/8 (8% wt loss x 1 month, significant for time frame).  Medications: Colace, Folic acid Labs reviewed: CBGs: 121-123  NUTRITION - FOCUSED PHYSICAL EXAM:  No depletions noted.  Diet Order:   Diet Order            Diet heart healthy/carb modified Room service appropriate? Yes; Fluid  consistency: Thin  Diet effective now                 EDUCATION NEEDS:   No education needs have been identified at this time  Skin:  Skin Assessment: Reviewed RN Assessment  Last BM:  9/12 -type 5  Height:   Ht Readings from Last 1 Encounters:  04/10/20 6\' 4"  (1.93 m)    Weight:   Wt Readings from Last 1 Encounters:  04/10/20 88.9 kg   BMI:  Body mass index is 23.86 kg/m.  Estimated Nutritional Needs:   Kcal:  2400-2600  Protein:  120-130g  Fluid:  2L/day  Clayton Bibles, MS, RD, LDN Inpatient Clinical Dietitian Contact information available via Amion

## 2020-04-10 NOTE — Progress Notes (Signed)
Gave patient the AD form-he didn't have his glasees, so I filled out the info he could tell me.  Left it with him.  When he completes it he is to ask for the chaplain to be paged.   04/10/20 1200  Clinical Encounter Type  Visited With Patient  Visit Type Initial;Other (Comment) (Needed AD paperwork)  Referral From Physician  Consult/Referral To Chaplain

## 2020-04-10 NOTE — Progress Notes (Signed)
Patient ID: Scott Conley, male   DOB: May 01, 1941, 79 y.o.   MRN: [id number]  PROGRESS NOTE    Scott Conley  WUJ:[id number] DOB: February 17, 1941 DOA: 04/07/2020 PCP: Golden Circle, FNP   Brief Narrative:  79 y.o. male with history of recently diagnosed non-small cell lung cancer on chemo and radiation presented to the ER with complaint of shortness of breath.  On presentation, he was tachypneic and tachycardic.  CT angiogram of the chest was negative for pulmonary embolism but did show features concerning for postobstructive pneumonia and was started on antibiotics.  COVID-19 test was negative.  Assessment & Plan:   Postobstructive pneumonia Acute hypoxic respiratory failure has been ruled out -Currently on cefepime.  Off vancomycin.  Cultures negative so far.  DC vancomycin.  COVID-19 testing negative on admission. -Currently on room air. -Diet as per SLP evaluation  Non-small cell lung cancer -Recently diagnosed; on chemo and radiation.  Outpatient follow-up with oncology. -Palliative care consultation for goals of care discussion.   -Patient thinks that he might not be able to tolerate next round of chemotherapy/radiation.  Will add oncology to care teams.  Confusion -Daughter's reported that the patient has been more confused lately as per nursing staff.  CT of the head was negative for any acute abnormality although showed chronic strokes.  Monitor mental status.  Fall precautions.  PT eval.  -Vitamin B12 and TSH levels normal  Folate deficiency -Folate level 1.9.  Start oral supplementation.  Hypertension--continue Coreg.  Hyperlipidemia -Continue statin  Anemia of chronic disease -Likely from cancer and chemo.  Stable hemoglobin  Hypokalemia -Improved  diabetes mellitus type 2 -Continue CBGs with SSI  Generalized conditioning -PT eval  Poor oral intake -Nutrition eval   DVT prophylaxis: Lovenox Code Status: Full Family Communication: Patient at  bedside Disposition Plan: Status is: Inpatient  Remains inpatient appropriate because:Inpatient level of care appropriate due to severity of illness.  Patient feels weak and not ready for discharge today.   Dispo: The patient is from: Home              Anticipated d/c is to: Home              Anticipated d/c date is: 1 day              Patient currently is not medically stable to d/c.    Consultants: None  Procedures: None  Antimicrobials: Cefepime  from 04/07/2020 onwards.  Vancomycin 04/07/2020-04/09/2020  Subjective: Patient seen and examined at bedside.  Feels weak and tired.  Still complains of cough and some shortness of breath and thinks that he is not ready for discharge today.  Denies overnight fever or vomiting. Objective: Vitals:   04/10/20 0330 04/10/20 0500 04/10/20 0622 04/10/20 0806  BP:   (!) 143/85   Pulse:   82   Resp:   18   Temp:   97.8 F (36.6 C)   TempSrc:   Oral   SpO2:   100% 98%  Weight:  88.9 kg    Height: 6\' 4"  (1.93 m)       Intake/Output Summary (Last 24 hours) at 04/10/2020 1002 Last data filed at 04/10/2020 0256 Gross per 24 hour  Intake 500 ml  Output 400 ml  Net 100 ml   Filed Weights   04/10/20 0500  Weight: 88.9 kg    Examination:  General exam: No distress.  Elderly male lying in bed.  Currently on room air. Respiratory system: Bilateral decreased  breath sounds at bases with some crackles, no wheezing Cardiovascular system: Rate controlled, S1-S2 heard Gastrointestinal system: Abdomen is nondistended, soft and nontender.  Bowel sounds are heard  extremities: Trace lower extremity edema, no clubbing Central nervous system: Awake, alert and oriented.  Poor historian.  No focal neurological deficits.  Moves extremities Skin: No obvious ecchymosis/lesions Psychiatry: Has flat affect  Data Reviewed: I have personally reviewed following labs and imaging studies  CBC: Recent Labs  Lab 04/04/20 1143 04/07/20 2006 04/08/20 0416  04/09/20 0649 04/10/20 0554  WBC 7.2 3.1* 3.3* 2.2* 2.3*  NEUTROABS 5.4 2.5  --  1.5* 1.6*  HGB 9.2* 9.6* 8.7* 7.9* 7.8*  HCT 30.9* 32.1* 28.4* 26.0* 26.1*  MCV 77.1* 77.5* 76.1* 76.9* 77.0*  PLT 294 240 220 190 144   Basic Metabolic Panel: Recent Labs  Lab 04/04/20 1143 04/07/20 2006 04/08/20 0416 04/09/20 0649 04/10/20 0554  NA 135 138 139 135 139  K 4.1 4.3 4.0 3.3* 3.7  CL 103 103 106 105 110  CO2 22 17* 16* 19* 19*  GLUCOSE 134* 144* 141* 115* 128*  BUN 10 13 13 10 12   CREATININE 0.77 0.82 0.64 0.49* 0.66  CALCIUM 8.8* 8.4* 7.5* 7.8* 7.9*  MG  --   --   --  2.0 2.1   GFR: Estimated Creatinine Clearance: 90.4 mL/min (by C-G formula based on SCr of 0.66 mg/dL). Liver Function Tests: Recent Labs  Lab 04/04/20 1143 04/07/20 2006 04/10/20 0554  AST 9* 18 15  ALT 12 14 14   ALKPHOS 65 50 47  BILITOT 0.9 1.2 1.0  PROT 7.1 6.9 6.0*  ALBUMIN 2.2* 2.7* 2.2*   No results for input(s): LIPASE, AMYLASE in the last 168 hours. Recent Labs  Lab 04/10/20 0554  AMMONIA 15   Coagulation Profile: Recent Labs  Lab 04/07/20 2006  INR 1.2   Cardiac Enzymes: No results for input(s): CKTOTAL, CKMB, CKMBINDEX, TROPONINI in the last 168 hours. BNP (last 3 results) No results for input(s): PROBNP in the last 8760 hours. HbA1C: No results for input(s): HGBA1C in the last 72 hours. CBG: Recent Labs  Lab 04/09/20 0806 04/09/20 1204 04/09/20 1651 04/09/20 2134 04/10/20 0747  GLUCAP 108* 129* 143* 116* 123*   Lipid Profile: No results for input(s): CHOL, HDL, LDLCALC, TRIG, CHOLHDL, LDLDIRECT in the last 72 hours. Thyroid Function Tests: Recent Labs    04/10/20 0554  TSH 0.959   Anemia Panel: Recent Labs    04/10/20 0554  VITAMINB12 650  FOLATE 1.9*   Sepsis Labs: Recent Labs  Lab 04/07/20 2006 04/07/20 2325 04/09/20 0649  PROCALCITON  --   --  0.16  LATICACIDVEN 2.8* 2.4*  --     Recent Results (from the past 240 hour(s))  Culture, blood (routine x  2)     Status: None (Preliminary result)   Collection Time: 04/07/20  8:06 PM   Specimen: BLOOD  Result Value Ref Range Status   Specimen Description   Final    BLOOD LEFT ANTECUBITAL Performed at [facility], [place] [address] [place], [place] [address]    Special Requests   Final    BOTTLES DRAWN AEROBIC AND ANAEROBIC Blood Culture results may not be optimal due to an excessive volume of blood received in culture bottles Performed at [place] [address] [place],  [address]    Culture   Final    NO GROWTH 1 DAY Performed at [facility], [place]  [address] [place], [place] [address]    Report Status PENDING  Incomplete  Culture, blood (routine x 2)     Status: None (Preliminary result)   Collection Time: 04/07/20  8:11 PM   Specimen: BLOOD  Result Value Ref Range Status   Specimen Description   Final    BLOOD RIGHT ANTECUBITAL Performed at [place] [address] [place], [place] [address]    Special Requests   Final    BOTTLES DRAWN AEROBIC AND ANAEROBIC Blood Culture results may not be optimal due to an excessive volume of blood received in culture bottles Performed at [place] [address], [place], [place] [address]    Culture   Final    NO GROWTH 1 DAY Performed at [facility], [place] [address], [place], [place] [address]    Report Status PENDING  Incomplete  SARS Coronavirus 2 by RT PCR (hospital order, performed in [facility] hospital lab) Nasopharyngeal Nasopharyngeal Swab     Status: None   Collection Time: 04/07/20  8:15 PM   Specimen: Nasopharyngeal Swab  Result Value Ref Range Status   SARS Coronavirus 2 NEGATIVE NEGATIVE Final    Comment: (NOTE) SARS-CoV-2 target nucleic acids are NOT DETECTED.  The SARS-CoV-2 RNA is generally detectable in upper and lower respiratory specimens during the acute phase of infection. The lowest concentration of  SARS-CoV-2 viral copies this assay can detect is 250 copies / mL. A negative result does not preclude SARS-CoV-2 infection and should not be used as the sole basis for treatment or other patient management decisions.  A negative result may occur with improper specimen collection / handling, submission of specimen other than nasopharyngeal swab, presence of viral mutation(s) within the areas targeted by this assay, and inadequate number of viral copies (<250 copies / mL). A negative result must be combined with clinical observations, patient history, and epidemiological information.  Fact Sheet for Patients:   StrictlyIdeas.no  Fact Sheet for Healthcare Providers: BankingDealers.co.za  This test is not yet approved or  cleared by the [place] FDA and has been authorized for detection and/or diagnosis of SARS-CoV-2 by FDA under an Emergency Use Authorization (EUA).  This EUA will remain in effect (meaning this test can be used) for the duration of the COVID-19 declaration under Section 564(b)(1) of the Act, 21 U.S.C. section 360bbb-3(b)(1), unless the authorization is terminated or revoked sooner.  Performed at [facility], [place] [address] [place], [place] [address]          Radiology Studies: CT HEAD WO CONTRAST  Result Date: 04/09/2020 CLINICAL DATA:  Delirium.  Increased altered mental status. EXAM: CT HEAD WITHOUT CONTRAST TECHNIQUE: Contiguous axial images were obtained from the base of the skull through the vertex without intravenous contrast. COMPARISON:  Brain MRI 03/22/2020, head CT 11/26/2013. FINDINGS: Brain: The examination is significantly motion degraded, limiting evaluation. Stable, moderate generalized parenchymal atrophy. Redemonstrated chronic cortically based infarcts within the right frontoparietal lobes. Stable, moderate ill-defined hypoattenuation within the cerebral white matter which is  nonspecific, but consistent with chronic small vessel ischemic disease. No acute intracranial hemorrhage is identified. No acute demarcated cortical infarct is identified. No extra-axial fluid collection. No evidence of intracranial mass. No midline shift. Vascular: No hyperdense vessel.  Atherosclerotic calcifications Skull: Normal. Negative for fracture or focal lesion. Sinuses/Orbits: Visualized orbits show no acute finding. No significant paranasal sinus disease or mastoid effusion at the imaged levels. IMPRESSION: Significantly motion degraded examination, limiting evaluation.  No acute intracranial abnormality is identified. Redemonstrated chronic cortically based infarcts within the right frontoparietal lobes. Stable background moderate generalized parenchymal atrophy and cerebral white matter chronic small vessel ischemic disease. Electronically Signed   By: Kellie Simmering DO   On: 04/09/2020 15:18        Scheduled Meds: . albuterol  2.5 mg Nebulization TID  . aspirin EC  81 mg Oral Daily  . atorvastatin  80 mg Oral Daily  . budesonide (PULMICORT) nebulizer solution  0.25 mg Nebulization BID  . carvedilol  6.25 mg Oral BID WC  . docusate sodium  200 mg Oral BID  . enoxaparin (LOVENOX) injection  40 mg Subcutaneous Q24H  . folic acid  1 mg Oral Daily  . insulin aspart  0-9 Units Subcutaneous TID WC  . pantoprazole  40 mg Oral Daily   Continuous Infusions: . ceFEPime (MAXIPIME) IV 2 g (04/09/20 2346)          Aline August, MD [facility] 04/10/2020, 10:02 AM

## 2020-04-10 NOTE — Progress Notes (Signed)
PT Cancellation Note  Patient Details Name: Scott Conley MRN: [id number] DOB: 1940/08/29   Cancelled Treatment:    Reason Eval/Treat Not Completed: Patient at procedure or test/unavailable, Patient in teast when checked on earlier. Will return tomorrow for eval.    Berdine Addison, [place] Pager [phone] Office [phone]  04/10/2020, 5:04 PM

## 2020-04-10 NOTE — Anesthesia Preprocedure Evaluation (Addendum)
Anesthesia Evaluation  Patient identified by MRN, date of birth, ID band Patient awake    Reviewed: Allergy & Precautions, NPO status , Patient's Chart, lab work & pertinent test results  Airway Mallampati: I  TM Distance: >3 FB Neck ROM: Full    Dental no notable dental hx. (+) Edentulous Upper, Partial Lower   Pulmonary COPD, former smoker,  Hx of small cell lung CA   Pulmonary exam normal breath sounds clear to auscultation       Cardiovascular hypertension, Pt. on medications Normal cardiovascular exam Rhythm:Regular Rate:Normal     Neuro/Psych CVA, Residual Symptoms    GI/Hepatic   Endo/Other  diabetes, Type 2  Renal/GU      Musculoskeletal   Abdominal   Peds  Hematology  (+) anemia ,   Anesthesia Other Findings   Reproductive/Obstetrics                            Anesthesia Physical Anesthesia Plan  ASA: II  Anesthesia Plan: MAC   Post-op Pain Management:    Induction:   PONV Risk Score and Plan: Treatment may vary due to age or medical condition  Airway Management Planned: Nasal Cannula and Natural Airway  Additional Equipment: None  Intra-op Plan:   Post-operative Plan:   Informed Consent: I have reviewed the patients History and Physical, chart, labs and discussed the procedure including the risks, benefits and alternatives for the proposed anesthesia with the patient or authorized representative who has indicated his/her understanding and acceptance.     Dental advisory given  Plan Discussed with: CRNA and Anesthesiologist  Anesthesia Plan Comments: (Egd for Dysphagia)       Anesthesia Quick Evaluation

## 2020-04-10 NOTE — Progress Notes (Signed)
Daily Progress Note   Patient Name: Scott Conley       Date: 04/10/2020 DOB: [birth year]  Age: 79 y.o. MRN#: [id number] Attending Physician: Aline August, MD Primary Care Physician: Golden Circle, FNP Admit Date: 04/07/2020  Reason for Consultation/Follow-up: Establishing goals of care  Subjective: I met again and discussed with Scott Conley today.  He reports that he continues to feel that his swallowing is impaired.  Discussed conversation with rad onc that this would be less likely to be radiation esophagitis based upon treatment timeline to this point.    He reports wanting to have input from GI and oncology prior to making decisions about care plan.  He again states that he is "still just not sure" if he is going to want further radiation and chemotherapy moving forward.  Length of Stay: 2  Current Medications: Scheduled Meds:   [START ON 04/11/2020] albuterol  2.5 mg Nebulization BID   aspirin EC  81 mg Oral Daily   atorvastatin  80 mg Oral Daily   budesonide (PULMICORT) nebulizer solution  0.25 mg Nebulization BID   carvedilol  6.25 mg Oral BID WC   docusate sodium  200 mg Oral BID   [START ON 04/12/2020] enoxaparin (LOVENOX) injection  40 mg Subcutaneous Q24H   feeding supplement (ENSURE ENLIVE)  237 mL Oral BID BM   folic acid  1 mg Oral Daily   [START ON 04/11/2020] influenza vaccine adjuvanted  0.5 mL Intramuscular Tomorrow-1000   insulin aspart  0-9 Units Subcutaneous TID WC   multivitamin with minerals  1 tablet Oral Daily   pantoprazole  40 mg Oral Daily    Continuous Infusions:  ceFEPime (MAXIPIME) IV 2 g (04/10/20 1830)    PRN Meds: acetaminophen **OR** acetaminophen, albuterol, ondansetron **OR** ondansetron (ZOFRAN) IV  Physical Exam           General: Alert, awake, in no acute distress.  HEENT: No bruits, no goiter, no JVD Heart: Regular rate and rhythm. No murmur appreciated. Lungs: Fair air movement, Scattered crackles Abdomen: Soft, nontender, nondistended, positive bowel sounds.  Ext: No significant edema Skin: Warm and dry Neuro: Grossly intact, nonfocal.   Vital Signs: BP 122/79 (BP Location: Left Arm)    Pulse 86    Temp 97.9 F (36.6 C) (Oral)    Resp 16  Ht _0  (1.93 m)    Wt 88.9 kg    SpO2 97%    BMI 23.86 kg/m  SpO2: SpO2: 97 % O2 Device: O2 Device: Room Air O2 Flow Rate:    Intake/output summary:   Intake/Output Summary (Last 24 hours) at 04/10/2020 2227 Last data filed at 04/10/2020 1728 Gross per 24 hour  Intake 620 ml  Output 100 ml  Net 520 ml   LBM: Last BM Date: 04/10/20 Baseline Weight: Weight: 88.9 kg Most recent weight: Weight: 88.9 kg       Palliative Assessment/Data:    Flowsheet Rows     Most Recent Value  Intake Tab  Referral Department Hospitalist  Unit at Time of Referral Oncology Unit  Palliative Care Primary Diagnosis Cancer  Date Notified 04/09/20  Palliative Care Type New Palliative care  Reason for referral Clarify Goals of Care  Date of Admission 04/07/20  Date first seen by Palliative Care 04/09/20  # of days Palliative referral response time 0 Day(s)  # of days IP prior to Palliative referral 2  Clinical Assessment  Palliative Performance Scale Score 50%  Psychosocial & Spiritual Assessment  Palliative Care Outcomes  Patient/Family meeting held? Yes  Who was at the meeting? Patient  Palliative Care Outcomes Clarified goals of care      Patient Active Problem List   Diagnosis Date Noted   Controlled type 2 diabetes mellitus with hyperglycemia ([place]) 04/08/2020   Postobstructive pneumonia 04/07/2020   Goals of care, counseling/discussion 03/20/2020   Encounter for antineoplastic chemotherapy 03/20/2020   Malignant neoplasm of bronchus of right  lower lobe (HCC)    Hilar mass    HLD (hyperlipidemia)    Hypokalemia    Hyponatremia    Microcytic anemia    Medicare annual wellness visit, subsequent 11/21/2016   Obesity 11/21/2016   Diabetes ([place]) 11/21/2015   Hypertension 11/21/2015   Low back strain 11/21/2015   Tobacco use 03/01/2014   Ataxia, late effect of cerebrovascular disease 01/18/2014   CVA (cerebral infarction) 11/30/2013   Stroke ([place]) 11/26/2013    Palliative Care Assessment & Plan   Patient Profile: Scott Conley is an 79 year old male with past medical history of recently diagnosed non-small cell lung cancer on chemo and radiation who presented to the ED with shortness of breath.  He was found to be tachypneic and tachycardic and CT angio was negative for pulmonary embolus but did show concern for postobstructive pneumonia.  He was started antibiotics.  He also has been having poor nutrition due to trouble swallowing.  Palliative consulted for goals of care.  Assessment: Patient Active Problem List   Diagnosis Date Noted   Controlled type 2 diabetes mellitus with hyperglycemia ([place]) 04/08/2020   Postobstructive pneumonia 04/07/2020   Goals of care, counseling/discussion 03/20/2020   Encounter for antineoplastic chemotherapy 03/20/2020   Malignant neoplasm of bronchus of right lower lobe (HCC)    Hilar mass    HLD (hyperlipidemia)    Hypokalemia    Hyponatremia    Microcytic anemia    Medicare annual wellness visit, subsequent 11/21/2016   Obesity 11/21/2016   Diabetes ([place]) 11/21/2015   Hypertension 11/21/2015   Low back strain 11/21/2015   Tobacco use 03/01/2014   Ataxia, late effect of cerebrovascular disease 01/18/2014   CVA (cerebral infarction) 11/30/2013   Stroke ([place]) 11/26/2013     Recommendations/Plan: - Full code/Full scope treatment- reports needing to discuss further with his daughters prior to making decision regarding any changes to [place] -  Referral  placed to spiritual care to facilitate HCPOA paperwork. - Discussed with radiation oncology.  Based on timing of radiation, radiation esophagitis less likely at this point.  He did have esophageal area light up on PET.  He would like evaluation by GI and input of oncology prior to further discussion of goals   - When asked about his thoughts on further chemotherapy/radiation, he reports, "I just do not know."  Appreciate GI eval and oncology input. - I offered to call his daughters.  He declined for me to call family today.  Goals of Care and Additional Recommendations:  Limitations on Scope of Treatment: Full Scope Treatment  Code Status:    Code Status Orders  (From admission, onward)         Start     Ordered   04/08/20 0304  Full code  Continuous        04/08/20 0304        Code Status History    Date Active Date Inactive Code Status Order ID Comments User Context   03/04/2020 1515 03/05/2020 2158 Full Code [id number]  Mckinley Jewel, MD ED   11/30/2013 1414 12/10/2013 1505 Full Code [id number]  Elizabeth Sauer Inpatient   11/26/2013 2233 11/30/2013 1414 Full Code [id number]  [name], MD Inpatient   Advance Care Planning Activity       Prognosis:   Guarded  Discharge Planning:  To Be Determined  Care plan was discussed with patient, rad onc, oncology, and Dr. Starla Link  Thank you for allowing the Palliative Medicine Team to assist in the care of this patient.   Total Time 30 Prolonged Time Billed No      Greater than 50%  of this time was spent counseling and coordinating care related to the above assessment and plan.  Micheline Rough, MD  Please contact Palliative Medicine Team phone at [phone] for questions and concerns.

## 2020-04-10 NOTE — Progress Notes (Signed)
SLP Cancellation Note  Patient Details Name: Scott Conley MRN: [id number] DOB: 1940-08-27   Cancelled treatment:       Reason Eval/Treat Not Completed: Other (comment) (per chart, pt at radiation, GI following and he is NPO after midnight - ? For GI procedure? Vs IR procedure? , will continue efforts)  Kathleen Lime, MS [facility] SLP Acute Rehab Services Office [phone]  Macario Golds 04/10/2020, 5:37 PM

## 2020-04-10 NOTE — Consult Note (Signed)
Chief Complaint: Patient was seen in consultation today for CT guided left upper lobe lung nodule biopsy  Chief Complaint  Patient presents with   Shortness of Breath    Referring Physician(s): Perkins,A  Supervising Physician: Jacqulynn Cadet  Patient Status: [facility] - In-pt  History of Present Illness: Scott Conley is an 79 y.o. male, ex smoker, with PMH sig for prior CVA, diabetes, DVT, COPD, hypertension, osteoarthritis, and recently diagnosed non-small cell lung cancer, status post bronchoscopy with endobronchial biopsies from rt lung on 8/7.  He is undergoing chemoradiation.  He is COVID-19 negative.  PET scan on 8/25 revealed:  1. Right parahilar mass lesion is markedly hypermetabolic consistent with known malignancy. 2. Hypermetabolic nodular pleural thickening posterior left lung apex. This could represent metastatic disease or a synchronous primary. 3. Marked focal hypermetabolism in the right colon, in the region the ileocecal valve. Noncontrast CT imaging for attenuation correction raises concern for a soft tissue lesion at this location. CT abdomen/pelvis with oral and intravenous contrast would likely prove helpful to further evaluate. There may be an associated short segment intussusception of the terminal ileum. 4. Right common iliac artery aneurysm measuring up to 3.9 cm diameter. Interventional Radiology consultation recommended. 5. Asymmetric uptake inferior right parotid region without discrete mass lesion or lymphadenopathy evident. CT neck with contrast may prove helpful to assess for lesion not visible on noncontrast CT imaging today. 6.  Emphysema. (ICD10-J43.9) 7.  Aortic Atherosclerois   Patient was originally scheduled to undergo CT-guided left upper lobe lung nodule biopsy as an outpatient at [facility]  on 9/16.  He is receiving substantial radiation therapy to the right hilum and rad onc does not really want to radiate the left apex unless needed,  therefore bx requested.  Patient however was admitted to [facility], ED on 9/10 with shortness of breath.  Imaging was negative for PE but did show some features concerning for postobstructive pneumonia and patient was started on antibiotics.  Blood cultures are negative to date.  Past Medical History:  Diagnosis Date   Aortic atherosclerosis (HCC)    Arthritis    Blood clot in vein    CVA (cerebral infarction)    Diabetes ([place])    DVT (deep venous thrombosis) (HCC)    Emphysema lung (HCC)    Hypertension    Lung cancer ([place])    Osteoarthritis    Stroke ([place])    Left hand stiffness     Past Surgical History:  Procedure Laterality Date   APPENDECTOMY     BIOPSY  03/05/2020   Procedure: BIOPSY;  Surgeon: Garner Nash, DO;  Location: [place] [facility];  Service: Pulmonary;;   CRYOTHERAPY  03/05/2020   Procedure: Cydney Ok;  Surgeon: Garner Nash, DO;  Location: [place] [facility];  Service: Pulmonary;;   CYST REMOVAL LEG     removed from right groin   HEMOSTASIS CONTROL  03/05/2020   Procedure: HEMOSTASIS CONTROL;  Surgeon: Garner Nash, DO;  Location: [place] [facility];  Service: Pulmonary;;   LOOP RECORDER IMPLANT  11/29/13   MDT LinQ implanted by Dr Caryl Comes for cryptogenic stroke   LOOP RECORDER IMPLANT N/A 11/29/2013   Procedure: LOOP RECORDER IMPLANT;  Surgeon: Deboraha Sprang, MD;  Location: [facility] CATH LAB;  Service: Cardiovascular;  Laterality: N/A;   TEE WITHOUT CARDIOVERSION N/A 11/29/2013   Procedure: TRANSESOPHAGEAL ECHOCARDIOGRAM (TEE);  Surgeon: Sueanne Margarita, MD;  Location: [place];  Service: Cardiovascular;  Laterality: N/A;   TONSILLECTOMY     VIDEO  BRONCHOSCOPY WITH ENDOBRONCHIAL ULTRASOUND N/A 03/05/2020   Procedure: VIDEO BRONCHOSCOPY WITH ENDOBRONCHIAL ULTRASOUND;  Surgeon: Garner Nash, DO;  Location: [place];  Service: Pulmonary;  Laterality: N/A;    Allergies: Patient has no known allergies.  Medications: Prior to Admission medications     Medication Sig Start Date End Date Taking? Authorizing Provider  aspirin EC 81 MG tablet Take 1 tablet (81 mg total) by mouth daily. Swallow whole. 03/06/20 03/06/21 Yes Hall, Carole N, DO  atorvastatin (LIPITOR) 80 MG tablet Take 1 tablet (80 mg total) by mouth daily. Must keep appt w/new provider for future refills 03/05/20  Yes Irene Pap N, DO  carvedilol (COREG) 6.25 MG tablet Take 1 tablet (6.25 mg total) by mouth 2 (two) times daily with a meal. 03/05/20  Yes Irene Pap N, DO  docusate sodium (COLACE) 100 MG capsule Take 200 mg by mouth 2 (two) times daily.   Yes [provider]  sitaGLIPtin (JANUVIA) 100 MG tablet Take 1 tablet (100 mg total) by mouth daily. Must keep appt w/new provider for future refills 06/23/17  Yes Biagio Borg, MD  arformoterol (BROVANA) 15 MCG/2ML NEBU Take 2 mLs (15 mcg total) by nebulization 2 (two) times daily. Patient not taking: Reported on 03/20/2020 03/05/20   Kayleen Memos, DO  bisacodyl (DULCOLAX) 10 MG suppository Place 1 suppository (10 mg total) rectally daily as needed for severe constipation. Patient not taking: Reported on 03/20/2020 03/05/20   Kayleen Memos, DO  budesonide (PULMICORT) 0.25 MG/2ML nebulizer solution Take 2 mLs (0.25 mg total) by nebulization 2 (two) times daily. Patient not taking: Reported on 03/20/2020 03/05/20   Kayleen Memos, DO  polyethylene glycol (MIRALAX) 17 g packet Take 17 g by mouth daily as needed for moderate constipation. Patient not taking: Reported on 03/20/2020 03/05/20   Kayleen Memos, DO  prochlorperazine (COMPAZINE) 10 MG tablet Take 1 tablet (10 mg total) by mouth every 6 (six) hours as needed for nausea or vomiting. 03/20/20   Curt Bears, MD  senna (SENOKOT) 8.6 MG TABS tablet Take 2 tablets (17.2 mg total) by mouth daily as needed for moderate constipation. Patient not taking: Reported on 03/20/2020 03/05/20   Kayleen Memos, DO     Family History  Problem Relation Age of Onset   Breast cancer Mother     Breast cancer Maternal Grandmother    Cirrhosis Brother     Social History   Socioeconomic History   Marital status: Widowed    Spouse name: Not on file   Number of children: 4   Years of education: 14   Highest education level: Not on file  Occupational History   Occupation: Retired  Tobacco Use   Smoking status: Former Smoker    Packs/day: 0.50    Years: 58.00    Pack years: 29.00    Types: Cigarettes    Start date: 11/30/2013   Smokeless tobacco: Never Used  Vaping Use   Vaping Use: Never used  Substance and Sexual Activity   Alcohol use: No    Comment: none since 1985; hx alcohol abuse   Drug use: No   Sexual activity: Not on file  Other Topics Concern   Not on file  Social History Narrative   Fun: playing billiards   Denies abuse and feels safe at home.    Social Determinants of Health   Financial Resource Strain:    Difficulty of Paying Living Expenses: Not on file  Food Insecurity:  Worried About Charity fundraiser in the Last Year: Not on file   [employer] of Food in the Last Year: Not on file  Transportation Needs:    Lack of Transportation (Medical): Not on file   Lack of Transportation (Non-Medical): Not on file  Physical Activity:    Days of Exercise per Week: Not on file   Minutes of Exercise per Session: Not on file  Stress:    Feeling of Stress : Not on file  Social Connections:    Frequency of Communication with Friends and Family: Not on file   Frequency of Social Gatherings with Friends and Family: Not on file   Attends Religious Services: Not on file   Active Member of Clubs or Organizations: Not on file   Attends Archivist Meetings: Not on file   Marital Status: Not on file      Review of Systems see above; denies fever, headache, chest pain, nausea, vomiting or bleeding.  Patient has had some left upper arm discomfort, occasional abdominal/back discomfort, occasional cough, dysphagia..  Also with  some recent mental status changes per family.CT head neg for acute change but with evidence of prior chronic strokes.  Vital Signs: BP 138/83 (BP Location: Left Arm)    Pulse 82    Temp 97.7 F (36.5 C) (Oral)    Resp 16    Ht _0  (1.93 m)    Wt 195 lb 15.8 oz (88.9 kg)    SpO2 97%    BMI 23.86 kg/m   Physical Exam awake/answers simple questions ok; chest-slightly diminished breath sounds with few scatt rhonchi;  Heart with regular rate and rhythm.  Abdomen soft, positive bowel sounds, nontender. No sig LE edema.  Imaging: CT HEAD WO CONTRAST  Result Date: 04/09/2020 CLINICAL DATA:  Delirium.  Increased altered mental status. EXAM: CT HEAD WITHOUT CONTRAST TECHNIQUE: Contiguous axial images were obtained from the base of the skull through the vertex without intravenous contrast. COMPARISON:  Brain MRI 03/22/2020, head CT 11/26/2013. FINDINGS: Brain: The examination is significantly motion degraded, limiting evaluation. Stable, moderate generalized parenchymal atrophy. Redemonstrated chronic cortically based infarcts within the right frontoparietal lobes. Stable, moderate ill-defined hypoattenuation within the cerebral white matter which is nonspecific, but consistent with chronic small vessel ischemic disease. No acute intracranial hemorrhage is identified. No acute demarcated cortical infarct is identified. No extra-axial fluid collection. No evidence of intracranial mass. No midline shift. Vascular: No hyperdense vessel.  Atherosclerotic calcifications Skull: Normal. Negative for fracture or focal lesion. Sinuses/Orbits: Visualized orbits show no acute finding. No significant paranasal sinus disease or mastoid effusion at the imaged levels. IMPRESSION: Significantly motion degraded examination, limiting evaluation. No acute intracranial abnormality is identified. Redemonstrated chronic cortically based infarcts within the right frontoparietal lobes. Stable background moderate generalized parenchymal  atrophy and cerebral white matter chronic small vessel ischemic disease. Electronically Signed   By: Kellie Simmering DO   On: 04/09/2020 15:18   CT Angio Chest PE W and/or Wo Contrast  Result Date: 04/07/2020 CLINICAL DATA:  Positive D-dimer. Suspected pulmonary embolus with low to intermediate probability. Diagnosis of stage III lung cancer with radiation today. EXAM: CT ANGIOGRAPHY CHEST WITH CONTRAST TECHNIQUE: Multidetector CT imaging of the chest was performed using the standard protocol during bolus administration of intravenous contrast. Multiplanar CT image reconstructions and MIPs were obtained to evaluate the vascular anatomy. CONTRAST:  183m OMNIPAQUE IOHEXOL 350 MG/ML SOLN COMPARISON:  03/04/2020 FINDINGS: Cardiovascular: There is good opacification of the central and segmental  pulmonary arteries. No focal filling defects. No evidence of significant pulmonary embolus. Normal heart size. No pericardial effusions. Normal caliber thoracic aorta. Scattered aortic and coronary artery calcifications. Mediastinum/Nodes: Right hilar lymphadenopathy and right hilar mass as seen on previous studies. The mass measures about 6.6 x 6.5 cm in diameter. Small esophageal hiatal hernia. Esophagus is decompressed. Lungs/Pleura: Emphysematous changes in the lungs. Scarring and pleural thickening in the left apex. Nodular scarring with spiculation in the left apex measures about 11 mm in diameter. Neoplasm is not excluded. Appearance is unchanged since prior study. Right hilar mass as previously indicated.Postobstructive pneumonia and atelectasis in the right lower lung medially and in the right middle lung. Upper Abdomen: No acute process demonstrated in the visualized upper abdominal contents. Musculoskeletal: No destructive bone lesions. Review of the MIP images confirms the above findings. IMPRESSION: 1. No evidence of significant pulmonary embolus. 2. Right hilar lymphadenopathy and right hilar mass as seen on  previous studies. Postobstructive pneumonia and atelectasis in the right lower lung medially and in the right middle lung. 3. Nodular scarring with spiculation in the left apex measures about 11 mm in diameter. Neoplasm is not excluded. 4. Emphysematous changes in the lungs. 5. Small esophageal hiatal hernia. 6. Emphysema and aortic atherosclerosis. Aortic Atherosclerosis (ICD10-I70.0) and Emphysema (ICD10-J43.9). Electronically Signed   By: Lucienne Capers M.D.   On: 04/07/2020 23:19   MR Brain W Wo Contrast  Result Date: 03/22/2020 CLINICAL DATA:  Staging non-small cell lung cancer. EXAM: MRI HEAD WITHOUT AND WITH CONTRAST TECHNIQUE: Multiplanar, multiecho pulse sequences of the brain and surrounding structures were obtained without and with intravenous contrast. CONTRAST:  57m GADAVIST GADOBUTROL 1 MMOL/ML IV SOLN COMPARISON:  11/27/2013 FINDINGS: Brain: Diffusion imaging does not show any acute or subacute infarction. No focal abnormality affects the brainstem or cerebellum. Cerebral hemispheres show generalized atrophy with chronic small-vessel ischemic change of the hemispheric white matter. Old right frontoparietal vertex cortical and subcortical infarction. No evidence of primary or metastatic mass lesion, hemorrhage, hydrocephalus or extra-axial collection. Vascular: Major vessels at the base of the brain show flow. Skull and upper cervical spine: Negative Sinuses/Orbits: Clear/normal Other: None IMPRESSION: 1. No evidence of metastatic disease. 2. Atrophy and chronic small-vessel ischemic changes as outlined above. Old right frontoparietal vertex cortical and subcortical infarction. Electronically Signed   By: MNelson ChimesM.D.   On: 03/22/2020 21:25   NM PET Image Initial (PI) Skull Base To Thigh  Addendum Date: 03/22/2020   ADDENDUM REPORT: 03/22/2020 14:10 ADDENDUM: As noted in the body of the report, there is hypermetabolism in the distal esophagus. This may be related to esophagitis, but  consider upper endoscopy to exclude neoplasm. Electronically Signed   By: EMisty StanleyM.D.   On: 03/22/2020 14:10   Result Date: 03/22/2020 CLINICAL DATA:  Initial treatment strategy for non-small-cell lung cancer. EXAM: NUCLEAR MEDICINE PET SKULL BASE TO THIGH TECHNIQUE: 10.4 mCi F-18 FDG was injected intravenously. Full-ring PET imaging was performed from the skull base to thigh after the radiotracer. CT data was obtained and used for attenuation correction and anatomic localization. Fasting blood glucose: 127 mg/dl COMPARISON:  Insert CT angio chest 03/04/2020 FINDINGS: Mediastinal blood pool activity: SUV max 3.3 Liver activity: SUV max NA NECK: Asymmetric uptake noted right inferior parotid gland without a discrete mass lesion evident. No lymphadenopathy in this region. Incidental CT findings: none CHEST: Right parahilar mass is hypermetabolic with central necrosis. SUV max = 23.3. Hypermetabolic activity extends into the right hilum. No hypermetabolic  mediastinal or left hilar lymphadenopathy. Focal nodular pleural thickening in the posterior left lung apex (image 55/4). Nodular component to this thickening measures 1.6 x 1.4 cm. SUV max = 5.1 Hypermetabolism is identified in the distal esophagus. Small to moderate hiatal hernia noted. Incidental CT findings: Coronary artery calcification is evident. Atherosclerotic calcification is noted in the wall of the thoracic aorta. Centrilobular emphsyema noted. ABDOMEN/PELVIS: No abnormal hypermetabolic activity within the liver, pancreas, adrenal glands, or spleen. No hypermetabolic lymph nodes in the abdomen or pelvis. There is marked focal hypermetabolism in the region of the ileocecal valve, potentially with mild intussusception in the terminal ileum. Although this is a noncontrast study, there is a suggestion of a soft tissue lesion in the cecum (see axial 158/4). As there are other areas of focal hypermetabolism in the colon, this may be physiologic.  Incidental CT findings: There is abdominal aortic atherosclerosis without aneurysm. Right common iliac artery is aneurysmal up to 3.9 cm diameter. Left common iliac artery at the bifurcation is 1.7 cm diameter. Advanced diverticular disease noted in the left colon. SKELETON: No focal hypermetabolic activity to suggest skeletal metastasis. Incidental CT findings: none IMPRESSION: 1. Right parahilar mass lesion is markedly hypermetabolic consistent with known malignancy. 2. Hypermetabolic nodular pleural thickening posterior left lung apex. This could represent metastatic disease or a synchronous primary. 3. Marked focal hypermetabolism in the right colon, in the region the ileocecal valve. Noncontrast CT imaging for attenuation correction raises concern for a soft tissue lesion at this location. CT abdomen/pelvis with oral and intravenous contrast would likely prove helpful to further evaluate. There may be an associated short segment intussusception of the terminal ileum. 4. Right common iliac artery aneurysm measuring up to 3.9 cm diameter. Interventional Radiology consultation recommended. 5. Asymmetric uptake inferior right parotid region without discrete mass lesion or lymphadenopathy evident. CT neck with contrast may prove helpful to assess for lesion not visible on noncontrast CT imaging today. 6.  Emphysema. (ICD10-J43.9) 7.  Aortic Atherosclerois (ICD10-170.0) Electronically Signed: By: Misty Stanley M.D. On: 03/22/2020 13:39   DG Chest Port 1 View  Result Date: 04/07/2020 CLINICAL DATA:  Stage III right lower lobe non-small cell lung cancer, dyspnea, cough EXAM: PORTABLE CHEST 1 VIEW COMPARISON:  03/04/2020 FINDINGS: 2 frontal views of the chest demonstrate right infrahilar mass unchanged since prior study. No new consolidation, effusion, or pneumothorax. Cardiac silhouette is stable. Loop recorder unchanged. IMPRESSION: 1. Stable right infrahilar mass. 2. No acute airspace disease. Electronically  Signed   By: Randa Ngo M.D.   On: 04/07/2020 20:51    Labs:  CBC: Recent Labs    04/07/20 2006 04/08/20 0416 04/09/20 0649 04/10/20 0554  WBC 3.1* 3.3* 2.2* 2.3*  HGB 9.6* 8.7* 7.9* 7.8*  HCT 32.1* 28.4* 26.0* 26.1*  PLT 240 220 190 203    COAGS: Recent Labs    03/05/20 0620 04/07/20 2006  INR 1.4* 1.2  APTT 42*  --     BMP: Recent Labs    04/07/20 2006 04/08/20 0416 04/09/20 0649 04/10/20 0554  NA 138 139 135 139  K 4.3 4.0 3.3* 3.7  CL 103 106 105 110  CO2 17* 16* 19* 19*  GLUCOSE 144* 141* 115* 128*  BUN _0 CALCIUM 8.4* 7.5* 7.8* 7.9*  CREATININE 0.82 0.64 0.49* 0.66  GFRNONAA >60 >60 >60 >60  GFRAA >60 >60 >60 >60    LIVER FUNCTION TESTS: Recent Labs    03/28/20 1033 04/04/20 1143 04/07/20 2006 04/10/20  0554  BILITOT 0.9 0.9 1.2 1.0  AST 13* 9* 18 15  ALT _0 ALKPHOS 70 65 50 47  PROT 7.3 7.1 6.9 6.0*  ALBUMIN 2.3* 2.2* 2.7* 2.2*    TUMOR MARKERS: No results for input(s): AFPTM, CEA, CA199, CHROMGRNA in the last 8760 hours.  Assessment and Plan:  79 y.o. male, ex smoker, with PMH sig for prior CVA, diabetes, DVT, COPD, hypertension, osteoarthritis, and recently diagnosed non-small cell lung cancer, status post bronchoscopy with endobronchial biopsies from rt lung on 8/7.  He is undergoing chemoradiation.  He is COVID-19 negative.  PET scan on 8/25 revealed:  1. Right parahilar mass lesion is markedly hypermetabolic consistent with known malignancy. 2. Hypermetabolic nodular pleural thickening posterior left lung apex. This could represent metastatic disease or a synchronous primary. 3. Marked focal hypermetabolism in the right colon, in the region the ileocecal valve. Noncontrast CT imaging for attenuation correction raises concern for a soft tissue lesion at this location. CT abdomen/pelvis with oral and intravenous contrast would likely prove helpful to further evaluate. There may be an associated short segment  intussusception of the terminal ileum. 4. Right common iliac artery aneurysm measuring up to 3.9 cm diameter. Interventional Radiology consultation recommended. 5. Asymmetric uptake inferior right parotid region without discrete mass lesion or lymphadenopathy evident. CT neck with contrast may prove helpful to assess for lesion not visible on noncontrast CT imaging today. 6.  Emphysema. (ICD10-J43.9) 7.  Aortic Atherosclerois   Patient was originally scheduled to undergo CT-guided left upper lobe lung nodule biopsy as an outpatient at [facility]  on 9/16.  He is receiving substantial radiation therapy to the right hilum and rad onc does not really want to radiate the left apex unless needed, therefore bx requested.  Patient however was admitted to [facility] ED on 9/10 with shortness of breath.  Imaging was negative for PE but did show some features concerning for postobstructive pneumonia and patient was started on antibiotics.  Blood cultures are negative to date.Imaging studies were reviewed by Dr. Laurence Ferrari. Risks and benefits of procedure was discussed with the patient and/or patient's family/daughter including, but not limited to bleeding, infection, pneumothorax/possible chest tube placement, damage to adjacent structures or low yield requiring additional tests.  All of the questions were answered and there is agreement to proceed.  Consent signed and in chart.  Pt had endoscopy today with MAC anesthesia therefore procedure will be done on 9/15   Thank you for this interesting consult.  I greatly enjoyed meeting Scott Conley and look forward to participating in their care.  A copy of this report was sent to the requesting provider on this date.  Electronically Signed: D. Rowe Robert, PA-C 04/10/2020, 3:20 PM   I spent a total of  25 minutes   in face to face in clinical consultation, greater than 50% of which was counseling/coordinating care for CT guided left lung nodule biopsy

## 2020-04-10 NOTE — H&P (View-Only) (Signed)
Referring Provider:  [facility]         Primary Care Physician:  Golden Circle, FNP Primary Gastroenterologist: .Has an upcoming appointment with Dr. Hilarie Fredrickson.             We were asked to see this patient for:   dysphagia               ASSESSMENT / PLAN:    # Decreased PO intake / vague swallowing problems.   --Limited historian . Denies food getting stuck. Says that sometimes swallowing takes his breath away and that is as descriptive as it gets. Weight down from 214 pounds to 196 pounds over the last month.  Esophagitis? Malignancy (seems less likely).  --Hypermetabolism in distal esophagus on PET scan.  --Symptoms not descriptive but in light of weight loss and PET scan findings he will need an EGD at some point.   ADDENDUM: I contact patient's daughter [name] to discuss EGD. She agrees with the plan to get it done tomorrow..   # Abnormal right colon in area of ICV on PET scan  --Noncontrast CT imaging for attenuation correction raises concern for a soft tissue lesion at this location. --Oncology had referred him to GI but he was unable to make appt. Rescheduled but appt not until early November.  --Needs colonoscopy if able to tolerate the prep. This may need to be postponed and just proceed with EGD for now.   # Non -small cell lung cancer --recently diagnosed. On Chemo and radiation --Palliative Care following. Full code.   # Acute on chronic anemia ( microcytic). Hgb 7.8, down from 9.2 on 04/04/20.  --Doesn't appear iron deficient on recent labs. --Declining hgb chemo induced?   --Further evaluation at time of endoscopic studies.   # Confusion --Daughters apparently reported worsening confusion. Head CT negative for acute findings --Limited historian but doesn't seem confused today. Patient says he can sign his own consent forms i    HPI:                                                                                                                              Chief Complaint:  Swallowing takes breath away sometimes  AISON MALVEAUX is a 79 y.o. male with a pmh significant for, not necessarily limited to: Non-small cell lung adenocarcinoma ( Stage IIIB( T3, N2, MO), emphysema,   Patient has recently diagnosed non-small cell lung cancer. Staging PET scan on 8/25  remarkable for focal hypermetabolism in colon and esophagus. He was scheduled to see Dr. Hilarie Fredrickson but had to cancel due to scheduling conflict with other appointments. He was rescheduled for sometimes in November. Patient has been undergoing chemotherapy / radiation. He present to ED a couple of days ago with SOB. He was tachypneic and tachycardic. CT angio negative for PE, remarkable for right hilar lymphadenopathy,  right hilar mass and postobstructive PNA  Patient says he has problems swallowing sometimes. Food  doesn't get stuck but swallowing sometimes takes his breath away. No pain with swallowing. Per RN he is able to take medication and a large vitamin without difficulty. Patient denies any other GI problems.   PREVIOUS ENDOSCOPIC EVALUATIONS / PERTINENT STUDIES   Pet Scan 8/25 IMPRESSION: 1. Right parahilar mass lesion is markedly hypermetabolic consistent with known malignancy. 2. Hypermetabolic nodular pleural thickening posterior left lung apex. This could represent metastatic disease or a synchronous primary. 3. Marked focal hypermetabolism in the right colon, in the region the ileocecal valve. Noncontrast CT imaging for attenuation correction raises concern for a soft tissue lesion at this location. CT abdomen/pelvis with oral and intravenous contrast would likely prove helpful to further evaluate. There may be an associated short segment intussusception of the terminal ileum. 4. Right common iliac artery aneurysm measuring up to 3.9 cm diameter. Interventional Radiology consultation recommended. 5. Asymmetric uptake inferior right parotid region without discrete mass lesion  or lymphadenopathy evident. CT neck with contrast may prove helpful to assess for lesion not visible on noncontrast CT imaging today. 6.  Emphysema. (ICD10-J43.9) 7.  Aortic Atherosclerois (ICD10-170.0)  Past Medical History:  Diagnosis Date  . Aortic atherosclerosis ([place])   . Arthritis   . Blood clot in vein   . CVA (cerebral infarction)   . Diabetes ([place])   . DVT (deep venous thrombosis) ([place])   . Emphysema lung ([place])   . Hypertension   . Lung cancer ([place])   . Osteoarthritis   . Stroke [facility])    Left hand stiffness     Past Surgical History:  Procedure Laterality Date  . APPENDECTOMY    . BIOPSY  03/05/2020   Procedure: BIOPSY;  Surgeon: Garner Nash, DO;  Location: [place] ENDOSCOPY;  Service: Pulmonary;;  . CRYOTHERAPY  03/05/2020   Procedure: CRYOTHERAPY;  Surgeon: Garner Nash, DO;  Location: [place] ENDOSCOPY;  Service: Pulmonary;;  . CYST REMOVAL LEG     removed from right groin  . HEMOSTASIS CONTROL  03/05/2020   Procedure: HEMOSTASIS CONTROL;  Surgeon: Garner Nash, DO;  Location: [place] ENDOSCOPY;  Service: Pulmonary;;  . LOOP RECORDER IMPLANT  11/29/13   MDT LinQ implanted by Dr Caryl Comes for cryptogenic stroke  . LOOP RECORDER IMPLANT N/A 11/29/2013   Procedure: LOOP RECORDER IMPLANT;  Surgeon: Deboraha Sprang, MD;  Location: [facility] CATH LAB;  Service: Cardiovascular;  Laterality: N/A;  . TEE WITHOUT CARDIOVERSION N/A 11/29/2013   Procedure: TRANSESOPHAGEAL ECHOCARDIOGRAM (TEE);  Surgeon: Sueanne Margarita, MD;  Location[facility] ENDOSCOPY;  Service: Cardiovascular;  Laterality: N/A;  . TONSILLECTOMY    . VIDEO BRONCHOSCOPY WITH ENDOBRONCHIAL ULTRASOUND N/A 03/05/2020   Procedure: VIDEO BRONCHOSCOPY WITH ENDOBRONCHIAL ULTRASOUND;  Surgeon: Garner Nash, DO;  Location: [place];  Service: Pulmonary;  Laterality: N/A;    Prior to Admission medications   Medication Sig Start Date End Date Taking? Authorizing Provider  aspirin EC 81 MG tablet Take 1 tablet (81 mg total) by mouth daily.  Swallow whole. 03/06/20 03/06/21 Yes Hall, Carole N, DO  atorvastatin (LIPITOR) 80 MG tablet Take 1 tablet (80 mg total) by mouth daily. Must keep appt w/new provider for future refills 03/05/20  Yes Irene Pap N, DO  carvedilol (COREG) 6.25 MG tablet Take 1 tablet (6.25 mg total) by mouth 2 (two) times daily with a meal. 03/05/20  Yes Irene Pap N, DO  docusate sodium (COLACE) 100 MG capsule Take 200 mg by mouth 2 (two) times daily.   Yes [provider]  sitaGLIPtin (JANUVIA) 100 MG tablet Take 1 tablet (100 mg total) by mouth daily. Must keep appt w/new provider for future refills 06/23/17  Yes Biagio Borg, MD  arformoterol (BROVANA) 15 MCG/2ML NEBU Take 2 mLs (15 mcg total) by nebulization 2 (two) times daily. Patient not taking: Reported on 03/20/2020 03/05/20   Kayleen Memos, DO  bisacodyl (DULCOLAX) 10 MG suppository Place 1 suppository (10 mg total) rectally daily as needed for severe constipation. Patient not taking: Reported on 03/20/2020 03/05/20   Kayleen Memos, DO  budesonide (PULMICORT) 0.25 MG/2ML nebulizer solution Take 2 mLs (0.25 mg total) by nebulization 2 (two) times daily. Patient not taking: Reported on 03/20/2020 03/05/20   Kayleen Memos, DO  polyethylene glycol (MIRALAX) 17 g packet Take 17 g by mouth daily as needed for moderate constipation. Patient not taking: Reported on 03/20/2020 03/05/20   Kayleen Memos, DO  prochlorperazine (COMPAZINE) 10 MG tablet Take 1 tablet (10 mg total) by mouth every 6 (six) hours as needed for nausea or vomiting. 03/20/20   Curt Bears, MD  senna (SENOKOT) 8.6 MG TABS tablet Take 2 tablets (17.2 mg total) by mouth daily as needed for moderate constipation. Patient not taking: Reported on 03/20/2020 03/05/20   Kayleen Memos, DO    Current Facility-Administered Medications  Medication Dose Route Frequency Provider Last Rate Last Admin  . acetaminophen (TYLENOL) tablet 650 mg  650 mg Oral Q6H PRN Rise Patience, MD       Or  .  acetaminophen (TYLENOL) suppository 650 mg  650 mg Rectal Q6H PRN Rise Patience, MD      . albuterol (PROVENTIL) (2.5 MG/3ML) 0.083% nebulizer solution 2.5 mg  2.5 mg Nebulization Q2H PRN Rise Patience, MD      . albuterol (PROVENTIL) (2.5 MG/3ML) 0.083% nebulizer solution 2.5 mg  2.5 mg Nebulization TID Aline August, MD   2.5 mg at 04/10/20 0806  . aspirin EC tablet 81 mg  81 mg Oral Daily Rise Patience, MD   81 mg at 04/10/20 1014  . atorvastatin (LIPITOR) tablet 80 mg  80 mg Oral Daily Rise Patience, MD   80 mg at 04/10/20 1015  . budesonide (PULMICORT) nebulizer solution 0.25 mg  0.25 mg Nebulization BID Rise Patience, MD   0.25 mg at 04/10/20 0806  . carvedilol (COREG) tablet 6.25 mg  6.25 mg Oral BID WC Rise Patience, MD   6.25 mg at 04/10/20 1015  . ceFEPIme (MAXIPIME) 2 g in sodium chloride 0.9 % 100 mL IVPB  2 g Intravenous Q8H Rise Patience, MD 200 mL/hr at 04/10/20 1016 2 g at 04/10/20 1016  . docusate sodium (COLACE) capsule 200 mg  200 mg Oral BID Rise Patience, MD   200 mg at 04/08/20 2127  . enoxaparin (LOVENOX) injection 40 mg  40 mg Subcutaneous Q24H Rise Patience, MD   40 mg at 04/10/20 1015  . feeding supplement (ENSURE ENLIVE) (ENSURE ENLIVE) liquid 237 mL  237 mL Oral BID BM Alekh, Kshitiz, MD      . folic acid (FOLVITE) tablet 1 mg  1 mg Oral Daily Starla Link, Kshitiz, MD   1 mg at 04/10/20 1015  . insulin aspart (novoLOG) injection 0-9 Units  0-9 Units Subcutaneous TID WC Rise Patience, MD   1 Units at 04/10/20 1030  . multivitamin with minerals tablet 1 tablet  1 tablet Oral Daily Aline August, MD      .  ondansetron (ZOFRAN) tablet 4 mg  4 mg Oral Q6H PRN Rise Patience, MD       Or  . ondansetron [facility]) injection 4 mg  4 mg Intravenous Q6H PRN Rise Patience, MD      . pantoprazole (PROTONIX) EC tablet 40 mg  40 mg Oral Daily Micheline Rough, MD   40 mg at 04/10/20 1015    Allergies as of  04/07/2020  . (No Known Allergies)    Family History  Problem Relation Age of Onset  . Breast cancer Mother   . Breast cancer Maternal Grandmother   . Cirrhosis Brother     Social History   Socioeconomic History  . Marital status: Widowed    Spouse name: Not on file  . Number of children: 4  . Years of education: 45  . Highest education level: Not on file  Occupational History  . Occupation: Retired  Tobacco Use  . Smoking status: Former Smoker    Packs/day: 0.50    Years: 58.00    Pack years: 29.00    Types: Cigarettes    Start date: 11/30/2013  . Smokeless tobacco: Never Used  Vaping Use  . Vaping Use: Never used  Substance and Sexual Activity  . Alcohol use: No    Comment: none since 1985; hx alcohol abuse  . Drug use: No  . Sexual activity: Not on file  Other Topics Concern  . Not on file  Social History Narrative   Fun: playing billiards   Denies abuse and feels safe at home.    Social Determinants of Health   Financial Resource Strain:   . Difficulty of Paying Living Expenses: Not on file  Food Insecurity:   . Worried About Charity fundraiser in the Last Year: Not on file  . Ran Out of Food in the Last Year: Not on file  Transportation Needs:   . Lack of Transportation (Medical): Not on file  . Lack of Transportation (Non-Medical): Not on file  Physical Activity:   . Days of Exercise per Week: Not on file  . Minutes of Exercise per Session: Not on file  Stress:   . Feeling of Stress : Not on file  Social Connections:   . Frequency of Communication with Friends and Family: Not on file  . Frequency of Social Gatherings with Friends and Family: Not on file  . Attends Religious Services: Not on file  . Active Member of Clubs or Organizations: Not on file  . Attends Archivist Meetings: Not on file  . Marital Status: Not on file  Intimate Partner Violence:   . Fear of Current or Ex-Partner: Not on file  . Emotionally Abused: Not on file  .  Physically Abused: Not on file  . Sexually Abused: Not on file    Review of Systems: Positive for weight loss. All other systems reviewed and negative except where noted in HPI.  PREVIOUS ENDOSCOPIC STUDIES / IMAGING:   none   OBJECTIVE:    Physical Exam: Vital signs in last 24 hours: Temp:  [97.8 F (36.6 C)-98.1 F (36.7 C)] 97.8 F (36.6 C) (09/13 0622) Pulse Rate:  [80-82] 82 (09/13 0622) Resp:  [18-20] 18 (09/13 0622) BP: (134-143)/(80-85) 143/85 (09/13 0622) SpO2:  [98 %-100 %] 98 % (09/13 0806) Weight:  [88.9 kg] 88.9 kg (09/13 0500) Last BM Date: 04/09/20 General:   Alert, thin male in NAD Psych:  Pleasant, cooperative. Normal mood and affect. Eyes:  Pupils equal,  sclera clear, no icterus.   Conjunctiva pink. Ears:  Normal auditory acuity. Nose:  No deformity, discharge,  or lesions. Neck:  Supple; no masses Lungs:  Clear throughout to auscultation.   No wheezes, crackles, or rhonchi.  Heart:  Regular rate and rhythm; no lower extremity edema Abdomen:  Soft, non-distended, nontender, BS active, no palp mass   Rectal:  Deferred  Msk:  Symmetrical without gross deformities. . Neurologic:  Alert and  oriented x4;  grossly normal neurologically. Skin:  Intact without significant lesions or rashes.  Filed Weights   04/10/20 0500  Weight: 88.9 kg     Scheduled inpatient medications . albuterol  2.5 mg Nebulization TID  . aspirin EC  81 mg Oral Daily  . atorvastatin  80 mg Oral Daily  . budesonide (PULMICORT) nebulizer solution  0.25 mg Nebulization BID  . carvedilol  6.25 mg Oral BID WC  . docusate sodium  200 mg Oral BID  . enoxaparin (LOVENOX) injection  40 mg Subcutaneous Q24H  . feeding supplement (ENSURE ENLIVE)  237 mL Oral BID BM  . folic acid  1 mg Oral Daily  . insulin aspart  0-9 Units Subcutaneous TID WC  . multivitamin with minerals  1 tablet Oral Daily  . pantoprazole  40 mg Oral Daily      Intake/Output from previous day: 09/12 0701 -  09/13 0700 In: 500 [IV Piggyback:500] Out: 400 [Urine:400] Intake/Output this shift: No intake/output data recorded.   Lab Results: Recent Labs    04/08/20 0416 04/09/20 0649 04/10/20 0554  WBC 3.3* 2.2* 2.3*  HGB 8.7* 7.9* 7.8*  HCT 28.4* 26.0* 26.1*  PLT 220 190 203   BMET Recent Labs    04/08/20 0416 04/09/20 0649 04/10/20 0554  NA 139 135 139  K 4.0 3.3* 3.7  CL 106 105 110  CO2 16* 19* 19*  GLUCOSE 141* 115* 128*  BUN 13 10 12   CREATININE 0.64 0.49* 0.66  CALCIUM 7.5* 7.8* 7.9*   LFT Recent Labs    04/10/20 0554  PROT 6.0*  ALBUMIN 2.2*  AST 15  ALT 14  ALKPHOS 47  BILITOT 1.0   PT/INR Recent Labs    04/07/20 2006  LABPROT 14.9  INR 1.2   Hepatitis Panel No results for input(s): HEPBSAG, HCVAB, HEPAIGM, HEPBIGM in the last 72 hours.   . CBC Latest Ref Rng & Units 04/10/2020 04/09/2020 04/08/2020  WBC 4.0 - 10.5 K/uL 2.3(L) 2.2(L) 3.3(L)  Hemoglobin 13.0 - 17.0 g/dL 7.8(L) 7.9(L) 8.7(L)  Hematocrit 39 - 52 % 26.1(L) 26.0(L) 28.4(L)  Platelets 150 - 400 K/uL 203 190 220    . CMP Latest Ref Rng & Units 04/10/2020 04/09/2020 04/08/2020  Glucose 70 - 99 mg/dL 128(H) 115(H) 141(H)  BUN 8 - 23 mg/dL 12 10 13   Creatinine 0.61 - 1.24 mg/dL 0.66 0.49(L) 0.64  Sodium 135 - 145 mmol/L 139 135 139  Potassium 3.5 - 5.1 mmol/L 3.7 3.3(L) 4.0  Chloride 98 - 111 mmol/L 110 105 106  CO2 22 - 32 mmol/L 19(L) 19(L) 16(L)  Calcium 8.9 - 10.3 mg/dL 7.9(L) 7.8(L) 7.5(L)  Total Protein 6.5 - 8.1 g/dL 6.0(L) - -  Total Bilirubin 0.3 - 1.2 mg/dL 1.0 - -  Alkaline Phos 38 - 126 U/L 47 - -  AST 15 - 41 U/L 15 - -  ALT 0 - 44 U/L 14 - -   Studies/Results: CT HEAD WO CONTRAST  Result Date: 04/09/2020 CLINICAL DATA:  Delirium.  Increased altered  mental status. EXAM: CT HEAD WITHOUT CONTRAST TECHNIQUE: Contiguous axial images were obtained from the base of the skull through the vertex without intravenous contrast. COMPARISON:  Brain MRI 03/22/2020, head CT 11/26/2013.  FINDINGS: Brain: The examination is significantly motion degraded, limiting evaluation. Stable, moderate generalized parenchymal atrophy. Redemonstrated chronic cortically based infarcts within the right frontoparietal lobes. Stable, moderate ill-defined hypoattenuation within the cerebral white matter which is nonspecific, but consistent with chronic small vessel ischemic disease. No acute intracranial hemorrhage is identified. No acute demarcated cortical infarct is identified. No extra-axial fluid collection. No evidence of intracranial mass. No midline shift. Vascular: No hyperdense vessel.  Atherosclerotic calcifications Skull: Normal. Negative for fracture or focal lesion. Sinuses/Orbits: Visualized orbits show no acute finding. No significant paranasal sinus disease or mastoid effusion at the imaged levels. IMPRESSION: Significantly motion degraded examination, limiting evaluation. No acute intracranial abnormality is identified. Redemonstrated chronic cortically based infarcts within the right frontoparietal lobes. Stable background moderate generalized parenchymal atrophy and cerebral white matter chronic small vessel ischemic disease. Electronically Signed   By: Kellie Simmering DO   On: 04/09/2020 15:18    Principal Problem:   Postobstructive pneumonia Active Problems:   Hypertension   Malignant neoplasm of bronchus of right lower lobe ([place])   Controlled type 2 diabetes mellitus with hyperglycemia ([place])    Tye Savoy, NP-C @  04/10/2020, 1:29 PM

## 2020-04-10 NOTE — Consult Note (Signed)
Palliative care consult note  Reason for consult: Goals of care in light of metastatic esophageal cancer.  Briefly, Mr. Sjogren is an 79 year old male with past medical history of recently diagnosed non-small cell lung cancer on chemo and radiation who presented to the ED with shortness of breath.  He was found to be tachypneic and tachycardic and CT angio was negative for pulmonary embolus but did show concern for postobstructive pneumonia.  He was started antibiotics.  He also has been having poor nutrition due to trouble swallowing.  Palliative consulted for goals of care.  I met today with Mr. Ortner.   I introduced palliative care as specialized medical care for people living with serious illness. It focuses on providing relief from the symptoms and stress of a serious illness. The goal is to improve quality of life for both the patient and the family.  He is a very pleasant elderly gentleman lying in bed in no acute distress.  He was watching football when I entered the room and welcomed me to join him for conversation.  He reports that the most important thing to him is being able to spend time with his family.  He has 4 children.  He lost his wife to an aneurysm when she was 9 years old.  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  In the past he has enjoyed bowling and playing pool, but he reports he is no longer able to do these activities.  He says he spends most days sitting in his recliner watching old movies and game shows.  We discussed clinical course as well as wishes moving forward in regard to advanced directives.  Concepts specific to code status and rehospitalization discussed.  We discussed surrogate decision making and who will be the best person to help make decisions on his behalf in the event that he cannot make them for himself.  When I asked about his thoughts on continuation of chemotherapy he stated "I just do not know."  We discussed difference  between a aggressive medical intervention path and a palliative, comfort focused care path.   We also discussed that he has just started systemic and radiation therapy and therefore it is too soon to know how well his cancer will respond to these interventions.  We talked more about his concerns and he states that the primary thing giving him problems right now is that he, "just cannot eat or swallow."  He says that he has had close to 40 pound weight loss over the past several months, but his swallowing acutely got worse a few days ago.  He feels as things "just will not go down anymore."  He denies having early satiety.  He reports some anorexia.  He also reports having a burning sensation that begins in his lower chest and moves up through his esophagus.  Recommendations: - Full code/Full scope treatment- reports needing to discuss further with his daughters prior to making decision regarding any changes to CODE STATUS - Discussed surrogate decision making.  He has 4 children but feels that his 2 daughters will be the ones to make decisions on his behalf in the event he cannot make his own decisions.  I will place a referral to spiritual care to follow-up with him to further discuss healthcare power of attorney paperwork. - He describes to me today symptoms that may be related to radiation esophagitis.  I started him on a PPI and discussed dietary changes including bland/soft  diet, small meals, avoiding spicy foods, avoiding alcohol and tobacco, and avoiding too hot or cold food/beverages.   - When asked about his thoughts on further chemotherapy/radiation, he reports, "I just do not know."  As noted, I am concerned if some of the acute worsening he has experienced in his swallow function and chest discomfort is onset of radiation esophagitis.  I will reach out to radiation oncology tomorrow for further input. - Will plan to f/u tomorrow.  Start time: 1830 End time: 1930  Greater than 50%  of this  time was spent counseling and coordinating care related to the above assessment and plan.  Micheline Rough, MD [place] Team [phone]

## 2020-04-10 NOTE — Consult Note (Addendum)
Referring Provider:  [facility]         Primary Care Physician:  Golden Circle, FNP Primary Gastroenterologist: .Has an upcoming appointment with Dr. Hilarie Fredrickson.             We were asked to see this patient for:   dysphagia               ASSESSMENT / PLAN:    # Decreased PO intake / vague swallowing problems.   --Limited historian . Denies food getting stuck. Says that sometimes swallowing takes his breath away and that is as descriptive as it gets. Weight down from 214 pounds to 196 pounds over the last month.  Esophagitis? Malignancy (seems less likely).  --Hypermetabolism in distal esophagus on PET scan.  --Symptoms not descriptive but in light of weight loss and PET scan findings he will need an EGD at some point.   ADDENDUM: I contact patient's daughter Scott Conley to discuss EGD. She agrees with the plan to get it done tomorrow..   # Abnormal right colon in area of ICV on PET scan  --Noncontrast CT imaging for attenuation correction raises concern for a soft tissue lesion at this location. --Oncology had referred him to GI but he was unable to make appt. Rescheduled but appt not until early November.  --Needs colonoscopy if able to tolerate the prep. This may need to be postponed and just proceed with EGD for now.   # Non -small cell lung cancer --recently diagnosed. On Chemo and radiation --Palliative Care following. Full code.   # Acute on chronic anemia ( microcytic). Hgb 7.8, down from 9.2 on 04/04/20.  --Doesn't appear iron deficient on recent labs. --Declining hgb chemo induced?   --Further evaluation at time of endoscopic studies.   # Confusion --Daughters apparently reported worsening confusion. Head CT negative for acute findings --Limited historian but doesn't seem confused today. Patient says he can sign his own consent forms i    HPI:                                                                                                                              Chief Complaint:  Swallowing takes breath away sometimes  Scott Conley is a 79 y.o. male with a pmh significant for, not necessarily limited to: Non-small cell lung adenocarcinoma ( Stage IIIB( T3, N2, MO), emphysema,   Patient has recently diagnosed non-small cell lung cancer. Staging PET scan on 8/25  remarkable for focal hypermetabolism in colon and esophagus. He was scheduled to see Dr. Hilarie Fredrickson but had to cancel due to scheduling conflict with other appointments. He was rescheduled for sometimes in November. Patient has been undergoing chemotherapy / radiation. He present to ED a couple of days ago with SOB. He was tachypneic and tachycardic. CT angio negative for PE, remarkable for right hilar lymphadenopathy,  right hilar mass and postobstructive PNA  Patient says he has problems swallowing sometimes. Food  doesn't get stuck but swallowing sometimes takes his breath away. No pain with swallowing. Per RN he is able to take medication and a large vitamin without difficulty. Patient denies any other GI problems.   PREVIOUS ENDOSCOPIC EVALUATIONS / PERTINENT STUDIES   Pet Scan 8/25 IMPRESSION: 1. Right parahilar mass lesion is markedly hypermetabolic consistent with known malignancy. 2. Hypermetabolic nodular pleural thickening posterior left lung apex. This could represent metastatic disease or a synchronous primary. 3. Marked focal hypermetabolism in the right colon, in the region the ileocecal valve. Noncontrast CT imaging for attenuation correction raises concern for a soft tissue lesion at this location. CT abdomen/pelvis with oral and intravenous contrast would likely prove helpful to further evaluate. There may be an associated short segment intussusception of the terminal ileum. 4. Right common iliac artery aneurysm measuring up to 3.9 cm diameter. Interventional Radiology consultation recommended. 5. Asymmetric uptake inferior right parotid region without discrete mass lesion  or lymphadenopathy evident. CT neck with contrast may prove helpful to assess for lesion not visible on noncontrast CT imaging today. 6.  Emphysema. (ICD10-J43.9) 7.  Aortic Atherosclerois (ICD10-170.0)  Past Medical History:  Diagnosis Date  . Aortic atherosclerosis ([place])   . Arthritis   . Blood clot in vein   . CVA (cerebral infarction)   . Diabetes ([place])   . DVT (deep venous thrombosis) ([place])   . Emphysema lung ([place])   . Hypertension   . Lung cancer ([place])   . Osteoarthritis   . Stroke [facility])    Left hand stiffness     Past Surgical History:  Procedure Laterality Date  . APPENDECTOMY    . BIOPSY  03/05/2020   Procedure: BIOPSY;  Surgeon: Garner Nash, DO;  Location: [facility] ENDOSCOPY;  Service: Pulmonary;;  . CRYOTHERAPY  03/05/2020   Procedure: CRYOTHERAPY;  Surgeon: Garner Nash, DO;  Location: [facility];  Service: Pulmonary;;  . CYST REMOVAL LEG     removed from right groin  . HEMOSTASIS CONTROL  03/05/2020   Procedure: HEMOSTASIS CONTROL;  Surgeon: Garner Nash, DO;  Location: [facility] ENDOSCOPY;  Service: Pulmonary;;  . LOOP RECORDER IMPLANT  11/29/13   MDT LinQ implanted by Dr Caryl Comes for cryptogenic stroke  . LOOP RECORDER IMPLANT N/A 11/29/2013   Procedure: LOOP RECORDER IMPLANT;  Surgeon: Deboraha Sprang, MD;  [facility] CATH LAB;  Service: Cardiovascular;  Laterality: N/A;  . TEE WITHOUT CARDIOVERSION N/A 11/29/2013   Procedure: TRANSESOPHAGEAL ECHOCARDIOGRAM (TEE);  Surgeon: Sueanne Margarita, MD;  Location: [facility] ENDOSCOPY;  Service: Cardiovascular;  Laterality: N/A;  . TONSILLECTOMY    . VIDEO BRONCHOSCOPY WITH ENDOBRONCHIAL ULTRASOUND N/A 03/05/2020   Procedure: VIDEO BRONCHOSCOPY WITH ENDOBRONCHIAL ULTRASOUND;  Surgeon: Garner Nash, DO;  Location: [facility];  Service: Pulmonary;  Laterality: N/A;    Prior to Admission medications   Medication Sig Start Date End Date Taking? Authorizing Provider  aspirin EC 81 MG tablet Take 1 tablet (81 mg total) by mouth daily.  Swallow whole. 03/06/20 03/06/21 Yes Hall, Carole N, DO  atorvastatin (LIPITOR) 80 MG tablet Take 1 tablet (80 mg total) by mouth daily. Must keep appt w/new provider for future refills 03/05/20  Yes Irene Pap N, DO  carvedilol (COREG) 6.25 MG tablet Take 1 tablet (6.25 mg total) by mouth 2 (two) times daily with a meal. 03/05/20  Yes Irene Pap N, DO  docusate sodium (COLACE) 100 MG capsule Take 200 mg by mouth 2 (two) times daily.   Yes [provider]  sitaGLIPtin (JANUVIA) 100 MG tablet Take 1 tablet (100 mg total) by mouth daily. Must keep appt w/new provider for future refills 06/23/17  Yes Biagio Borg, MD  arformoterol (BROVANA) 15 MCG/2ML NEBU Take 2 mLs (15 mcg total) by nebulization 2 (two) times daily. Patient not taking: Reported on 03/20/2020 03/05/20   Kayleen Memos, DO  bisacodyl (DULCOLAX) 10 MG suppository Place 1 suppository (10 mg total) rectally daily as needed for severe constipation. Patient not taking: Reported on 03/20/2020 03/05/20   Kayleen Memos, DO  budesonide (PULMICORT) 0.25 MG/2ML nebulizer solution Take 2 mLs (0.25 mg total) by nebulization 2 (two) times daily. Patient not taking: Reported on 03/20/2020 03/05/20   Kayleen Memos, DO  polyethylene glycol (MIRALAX) 17 g packet Take 17 g by mouth daily as needed for moderate constipation. Patient not taking: Reported on 03/20/2020 03/05/20   Kayleen Memos, DO  prochlorperazine (COMPAZINE) 10 MG tablet Take 1 tablet (10 mg total) by mouth every 6 (six) hours as needed for nausea or vomiting. 03/20/20   Curt Bears, MD  senna (SENOKOT) 8.6 MG TABS tablet Take 2 tablets (17.2 mg total) by mouth daily as needed for moderate constipation. Patient not taking: Reported on 03/20/2020 03/05/20   Kayleen Memos, DO    Current Facility-Administered Medications  Medication Dose Route Frequency Provider Last Rate Last Admin  . acetaminophen (TYLENOL) tablet 650 mg  650 mg Oral Q6H PRN Rise Patience, MD       Or  .  acetaminophen (TYLENOL) suppository 650 mg  650 mg Rectal Q6H PRN Rise Patience, MD      . albuterol (PROVENTIL) (2.5 MG/3ML) 0.083% nebulizer solution 2.5 mg  2.5 mg Nebulization Q2H PRN Rise Patience, MD      . albuterol (PROVENTIL) (2.5 MG/3ML) 0.083% nebulizer solution 2.5 mg  2.5 mg Nebulization TID Aline August, MD   2.5 mg at 04/10/20 0806  . aspirin EC tablet 81 mg  81 mg Oral Daily Rise Patience, MD   81 mg at 04/10/20 1014  . atorvastatin (LIPITOR) tablet 80 mg  80 mg Oral Daily Rise Patience, MD   80 mg at 04/10/20 1015  . budesonide (PULMICORT) nebulizer solution 0.25 mg  0.25 mg Nebulization BID Rise Patience, MD   0.25 mg at 04/10/20 0806  . carvedilol (COREG) tablet 6.25 mg  6.25 mg Oral BID WC Rise Patience, MD   6.25 mg at 04/10/20 1015  . ceFEPIme (MAXIPIME) 2 g in sodium chloride 0.9 % 100 mL IVPB  2 g Intravenous Q8H Rise Patience, MD 200 mL/hr at 04/10/20 1016 2 g at 04/10/20 1016  . docusate sodium (COLACE) capsule 200 mg  200 mg Oral BID Rise Patience, MD   200 mg at 04/08/20 2127  . enoxaparin (LOVENOX) injection 40 mg  40 mg Subcutaneous Q24H Rise Patience, MD   40 mg at 04/10/20 1015  . feeding supplement (ENSURE ENLIVE) (ENSURE ENLIVE) liquid 237 mL  237 mL Oral BID BM Alekh, Kshitiz, MD      . folic acid (FOLVITE) tablet 1 mg  1 mg Oral Daily Starla Link, Kshitiz, MD   1 mg at 04/10/20 1015  . insulin aspart (novoLOG) injection 0-9 Units  0-9 Units Subcutaneous TID WC Rise Patience, MD   1 Units at 04/10/20 1030  . multivitamin with minerals tablet 1 tablet  1 tablet Oral Daily Aline August, MD      .  ondansetron (ZOFRAN) tablet 4 mg  4 mg Oral Q6H PRN Rise Patience, MD       Or  . ondansetron [facility]) injection 4 mg  4 mg Intravenous Q6H PRN Rise Patience, MD      . pantoprazole (PROTONIX) EC tablet 40 mg  40 mg Oral Daily Micheline Rough, MD   40 mg at 04/10/20 1015    Allergies as of  04/07/2020  . (No Known Allergies)    Family History  Problem Relation Age of Onset  . Breast cancer Mother   . Breast cancer Maternal Grandmother   . Cirrhosis Brother     Social History   Socioeconomic History  . Marital status: Widowed    Spouse name: Not on file  . Number of children: 4  . Years of education: 2  . Highest education level: Not on file  Occupational History  . Occupation: Retired  Tobacco Use  . Smoking status: Former Smoker    Packs/day: 0.50    Years: 58.00    Pack years: 29.00    Types: Cigarettes    Start date: 11/30/2013  . Smokeless tobacco: Never Used  Vaping Use  . Vaping Use: Never used  Substance and Sexual Activity  . Alcohol use: No    Comment: none since 1985; hx alcohol abuse  . Drug use: No  . Sexual activity: Not on file  Other Topics Concern  . Not on file  Social History Narrative   Fun: playing billiards   Denies abuse and feels safe at home.    Social Determinants of Health   Financial Resource Strain:   . Difficulty of Paying Living Expenses: Not on file  Food Insecurity:   . Worried About Charity fundraiser in the Last Year: Not on file  . Ran Out of Food in the Last Year: Not on file  Transportation Needs:   . Lack of Transportation (Medical): Not on file  . Lack of Transportation (Non-Medical): Not on file  Physical Activity:   . Days of Exercise per Week: Not on file  . Minutes of Exercise per Session: Not on file  Stress:   . Feeling of Stress : Not on file  Social Connections:   . Frequency of Communication with Friends and Family: Not on file  . Frequency of Social Gatherings with Friends and Family: Not on file  . Attends Religious Services: Not on file  . Active Member of Clubs or Organizations: Not on file  . Attends Archivist Meetings: Not on file  . Marital Status: Not on file  Intimate Partner Violence:   . Fear of Current or Ex-Partner: Not on file  . Emotionally Abused: Not on file  .  Physically Abused: Not on file  . Sexually Abused: Not on file    Review of Systems: Positive for weight loss. All other systems reviewed and negative except where noted in HPI.  PREVIOUS ENDOSCOPIC STUDIES / IMAGING:   none   OBJECTIVE:    Physical Exam: Vital signs in last 24 hours: Temp:  [97.8 F (36.6 C)-98.1 F (36.7 C)] 97.8 F (36.6 C) (09/13 0622) Pulse Rate:  [80-82] 82 (09/13 0622) Resp:  [18-20] 18 (09/13 0622) BP: (134-143)/(80-85) 143/85 (09/13 0622) SpO2:  [98 %-100 %] 98 % (09/13 0806) Weight:  [88.9 kg] 88.9 kg (09/13 0500) Last BM Date: 04/09/20 General:   Alert, thin male in NAD Psych:  Pleasant, cooperative. Normal mood and affect. Eyes:  Pupils equal,  sclera clear, no icterus.   Conjunctiva pink. Ears:  Normal auditory acuity. Nose:  No deformity, discharge,  or lesions. Neck:  Supple; no masses Lungs:  Clear throughout to auscultation.   No wheezes, crackles, or rhonchi.  Heart:  Regular rate and rhythm; no lower extremity edema Abdomen:  Soft, non-distended, nontender, BS active, no palp mass   Rectal:  Deferred  Msk:  Symmetrical without gross deformities. . Neurologic:  Alert and  oriented x4;  grossly normal neurologically. Skin:  Intact without significant lesions or rashes.  Filed Weights   04/10/20 0500  Weight: 88.9 kg     Scheduled inpatient medications . albuterol  2.5 mg Nebulization TID  . aspirin EC  81 mg Oral Daily  . atorvastatin  80 mg Oral Daily  . budesonide (PULMICORT) nebulizer solution  0.25 mg Nebulization BID  . carvedilol  6.25 mg Oral BID WC  . docusate sodium  200 mg Oral BID  . enoxaparin (LOVENOX) injection  40 mg Subcutaneous Q24H  . feeding supplement (ENSURE ENLIVE)  237 mL Oral BID BM  . folic acid  1 mg Oral Daily  . insulin aspart  0-9 Units Subcutaneous TID WC  . multivitamin with minerals  1 tablet Oral Daily  . pantoprazole  40 mg Oral Daily      Intake/Output from previous day: 09/12 0701 -  09/13 0700 In: 500 [IV Piggyback:500] Out: 400 [Urine:400] Intake/Output this shift: No intake/output data recorded.   Lab Results: Recent Labs    04/08/20 0416 04/09/20 0649 04/10/20 0554  WBC 3.3* 2.2* 2.3*  HGB 8.7* 7.9* 7.8*  HCT 28.4* 26.0* 26.1*  PLT 220 190 203   BMET Recent Labs    04/08/20 0416 04/09/20 0649 04/10/20 0554  NA 139 135 139  K 4.0 3.3* 3.7  CL 106 105 110  CO2 16* 19* 19*  GLUCOSE 141* 115* 128*  BUN 13 10 12   CREATININE 0.64 0.49* 0.66  CALCIUM 7.5* 7.8* 7.9*   LFT Recent Labs    04/10/20 0554  PROT 6.0*  ALBUMIN 2.2*  AST 15  ALT 14  ALKPHOS 47  BILITOT 1.0   PT/INR Recent Labs    04/07/20 2006  LABPROT 14.9  INR 1.2   Hepatitis Panel No results for input(s): HEPBSAG, HCVAB, HEPAIGM, HEPBIGM in the last 72 hours.   . CBC Latest Ref Rng & Units 04/10/2020 04/09/2020 04/08/2020  WBC 4.0 - 10.5 K/uL 2.3(L) 2.2(L) 3.3(L)  Hemoglobin 13.0 - 17.0 g/dL 7.8(L) 7.9(L) 8.7(L)  Hematocrit 39 - 52 % 26.1(L) 26.0(L) 28.4(L)  Platelets 150 - 400 K/uL 203 190 220    . CMP Latest Ref Rng & Units 04/10/2020 04/09/2020 04/08/2020  Glucose 70 - 99 mg/dL 128(H) 115(H) 141(H)  BUN 8 - 23 mg/dL 12 10 13   Creatinine 0.61 - 1.24 mg/dL 0.66 0.49(L) 0.64  Sodium 135 - 145 mmol/L 139 135 139  Potassium 3.5 - 5.1 mmol/L 3.7 3.3(L) 4.0  Chloride 98 - 111 mmol/L 110 105 106  CO2 22 - 32 mmol/L 19(L) 19(L) 16(L)  Calcium 8.9 - 10.3 mg/dL 7.9(L) 7.8(L) 7.5(L)  Total Protein 6.5 - 8.1 g/dL 6.0(L) - -  Total Bilirubin 0.3 - 1.2 mg/dL 1.0 - -  Alkaline Phos 38 - 126 U/L 47 - -  AST 15 - 41 U/L 15 - -  ALT 0 - 44 U/L 14 - -   Studies/Results: CT HEAD WO CONTRAST  Result Date: 04/09/2020 CLINICAL DATA:  Delirium.  Increased altered  mental status. EXAM: CT HEAD WITHOUT CONTRAST TECHNIQUE: Contiguous axial images were obtained from the base of the skull through the vertex without intravenous contrast. COMPARISON:  Brain MRI 03/22/2020, head CT 11/26/2013.  FINDINGS: Brain: The examination is significantly motion degraded, limiting evaluation. Stable, moderate generalized parenchymal atrophy. Redemonstrated chronic cortically based infarcts within the right frontoparietal lobes. Stable, moderate ill-defined hypoattenuation within the cerebral white matter which is nonspecific, but consistent with chronic small vessel ischemic disease. No acute intracranial hemorrhage is identified. No acute demarcated cortical infarct is identified. No extra-axial fluid collection. No evidence of intracranial mass. No midline shift. Vascular: No hyperdense vessel.  Atherosclerotic calcifications Skull: Normal. Negative for fracture or focal lesion. Sinuses/Orbits: Visualized orbits show no acute finding. No significant paranasal sinus disease or mastoid effusion at the imaged levels. IMPRESSION: Significantly motion degraded examination, limiting evaluation. No acute intracranial abnormality is identified. Redemonstrated chronic cortically based infarcts within the right frontoparietal lobes. Stable background moderate generalized parenchymal atrophy and cerebral white matter chronic small vessel ischemic disease. Electronically Signed   By: Kellie Simmering DO   On: 04/09/2020 15:18    Principal Problem:   Postobstructive pneumonia Active Problems:   Hypertension   Malignant neoplasm of bronchus of right lower lobe ([place])   Controlled type 2 diabetes mellitus with hyperglycemia ([place])    Tye Savoy, NP-C @  04/10/2020, 1:29 PM

## 2020-04-11 ENCOUNTER — Encounter (HOSPITAL_COMMUNITY): Payer: Self-pay | Admitting: Internal Medicine

## 2020-04-11 ENCOUNTER — Inpatient Hospital Stay (HOSPITAL_COMMUNITY): Payer: Medicare HMO | Admitting: Anesthesiology

## 2020-04-11 ENCOUNTER — Encounter (HOSPITAL_COMMUNITY)
Admission: EM | Disposition: A | Discharge status: Home / Self Care | Payer: Self-pay | Source: Home / Self Care | Attending: Internal Medicine

## 2020-04-11 ENCOUNTER — Ambulatory Visit
Admission: RE | Admit: 2020-04-11 | Discharge: 2020-04-11 | Disposition: A | Discharge status: Home / Self Care | Payer: Medicare HMO | Source: Ambulatory Visit | Attending: Radiation Oncology | Admitting: Radiation Oncology

## 2020-04-11 DIAGNOSIS — K2091 Esophagitis, unspecified with bleeding: Secondary | ICD-10-CM

## 2020-04-11 DIAGNOSIS — K259 Gastric ulcer, unspecified as acute or chronic, without hemorrhage or perforation: Secondary | ICD-10-CM

## 2020-04-11 DIAGNOSIS — C3431 Malignant neoplasm of lower lobe, right bronchus or lung: Secondary | ICD-10-CM | POA: Diagnosis not present

## 2020-04-11 HISTORY — PX: BIOPSY: SHX5522

## 2020-04-11 HISTORY — PX: ESOPHAGOGASTRODUODENOSCOPY (EGD) WITH PROPOFOL: SHX5813

## 2020-04-11 LAB — CBC WITH DIFFERENTIAL/PLATELET
Abs Immature Granulocytes: 0.04 10*3/uL (ref 0.00–0.07)
Basophils Absolute: 0 10*3/uL (ref 0.0–0.1)
Basophils Relative: 1 %
Eosinophils Absolute: 0.1 10*3/uL (ref 0.0–0.5)
Eosinophils Relative: 4 %
HCT: 26 % — ABNORMAL LOW (ref 39.0–52.0)
Hemoglobin: 7.7 g/dL — ABNORMAL LOW (ref 13.0–17.0)
Immature Granulocytes: 2 %
Lymphocytes Relative: 21 %
Lymphs Abs: 0.5 10*3/uL — ABNORMAL LOW (ref 0.7–4.0)
MCH: 23.3 pg — ABNORMAL LOW (ref 26.0–34.0)
MCHC: 29.6 g/dL — ABNORMAL LOW (ref 30.0–36.0)
MCV: 78.5 fL — ABNORMAL LOW (ref 80.0–100.0)
Monocytes Absolute: 0.2 10*3/uL (ref 0.1–1.0)
Monocytes Relative: 9 %
Neutro Abs: 1.4 10*3/uL — ABNORMAL LOW (ref 1.7–7.7)
Neutrophils Relative %: 63 %
Platelets: 203 10*3/uL (ref 150–400)
RBC: 3.31 MIL/uL — ABNORMAL LOW (ref 4.22–5.81)
RDW: 22 % — ABNORMAL HIGH (ref 11.5–15.5)
WBC: 2.2 10*3/uL — ABNORMAL LOW (ref 4.0–10.5)
nRBC: 0.9 % — ABNORMAL HIGH (ref 0.0–0.2)

## 2020-04-11 LAB — BASIC METABOLIC PANEL
Anion gap: 12 (ref 5–15)
BUN: 10 mg/dL (ref 8–23)
CO2: 17 mmol/L — ABNORMAL LOW (ref 22–32)
Calcium: 7.8 mg/dL — ABNORMAL LOW (ref 8.9–10.3)
Chloride: 106 mmol/L (ref 98–111)
Creatinine, Ser: 0.59 mg/dL — ABNORMAL LOW (ref 0.61–1.24)
GFR calc Af Amer: >60 mL/min (ref >60)
GFR calc non Af Amer: >60 mL/min (ref >60)
Glucose, Bld: 101 mg/dL — ABNORMAL HIGH (ref 70–99)
Potassium: 3.5 mmol/L (ref 3.5–5.1)
Sodium: 135 mmol/L (ref 135–145)

## 2020-04-11 LAB — GLUCOSE, CAPILLARY
Glucose-Capillary: 100 mg/dL — ABNORMAL HIGH (ref 70–99)
Glucose-Capillary: 118 mg/dL — ABNORMAL HIGH (ref 70–99)
Glucose-Capillary: 139 mg/dL — ABNORMAL HIGH (ref 70–99)
Glucose-Capillary: 130 mg/dL — ABNORMAL HIGH (ref 70–99)

## 2020-04-11 LAB — MAGNESIUM: Magnesium: 1.9 mg/dL (ref 1.7–2.4)

## 2020-04-11 SURGERY — ESOPHAGOGASTRODUODENOSCOPY (EGD) WITH PROPOFOL
Anesthesia: Monitor Anesthesia Care

## 2020-04-11 MED ORDER — ENOXAPARIN SODIUM 40 MG/0.4ML ~~LOC~~ SOLN
40.0000 mg | SUBCUTANEOUS | Status: DC
  Administered 2020-04-13 – 2020-04-15 (×3): 40 mg via SUBCUTANEOUS
  Filled 2020-04-11 (×3): qty 0.4

## 2020-04-11 MED ORDER — PROPOFOL 10 MG/ML IV BOLUS
INTRAVENOUS | Status: AC
  Filled 2020-04-11: qty 20

## 2020-04-11 MED ORDER — PANTOPRAZOLE SODIUM 40 MG IV SOLR
40.0000 mg | Freq: Two times a day (BID) | INTRAVENOUS | Status: DC
  Administered 2020-04-11 – 2020-04-13 (×6): 40 mg via INTRAVENOUS
  Filled 2020-04-11 (×6): qty 40

## 2020-04-11 MED ORDER — PROPOFOL 500 MG/50ML IV EMUL
INTRAVENOUS | Status: AC
  Filled 2020-04-11: qty 50

## 2020-04-11 MED ORDER — SUCRALFATE 1 GM/10ML PO SUSP
1.0000 g | Freq: Three times a day (TID) | ORAL | Status: DC
  Administered 2020-04-11 – 2020-04-15 (×17): 1 g via ORAL
  Filled 2020-04-11 (×18): qty 10

## 2020-04-11 MED ORDER — PROPOFOL 10 MG/ML IV BOLUS
INTRAVENOUS | Status: DC | PRN
  Administered 2020-04-11: 10 mg via INTRAVENOUS
  Administered 2020-04-11: 20 mg via INTRAVENOUS

## 2020-04-11 MED ORDER — LIDOCAINE 2% (20 MG/ML) 5 ML SYRINGE
INTRAMUSCULAR | Status: DC | PRN
  Administered 2020-04-11: 80 mg via INTRAVENOUS

## 2020-04-11 MED ORDER — LACTATED RINGERS IV SOLN: INTRAVENOUS | Status: DC | PRN

## 2020-04-11 MED ORDER — PROPOFOL 500 MG/50ML IV EMUL
INTRAVENOUS | Status: DC | PRN
  Administered 2020-04-11: 100 ug/kg/min via INTRAVENOUS

## 2020-04-11 SURGICAL SUPPLY — 15 items

## 2020-04-11 NOTE — Progress Notes (Signed)
Pharmacy Antibiotic Note  Scott Conley is a 79 y.o. male admitted on 04/07/2020 with postobstructive PNA.  Pharmacy has been consulted for vancomycin and cefepime dosing. Vancomycin was discontinued 9/12.   Plan: Continue cefepime 2 gr IV q8h    Monitor clinical course, renal function, cultures   F/U planned duration of therapy   Height: 6\' 3"  (190.5 cm) Weight: 89.8 kg (198 lb) IBW/kg (Calculated) : 84.5  Temp (24hrs), Avg:97.9 F (36.6 C), Min:97.7 F (36.5 C), Max:98 F (36.7 C)  Recent Labs  Lab 04/07/20 2006 04/07/20 2325 04/08/20 0416 04/09/20 0649 04/10/20 0554 04/11/20 0525  WBC 3.1*  --  3.3* 2.2* 2.3* 2.2*  CREATININE 0.82  --  0.64 0.49* 0.66 0.59*  LATICACIDVEN 2.8* 2.4*  --   --   --   --     Estimated Creatinine Clearance: 88 mL/min (A) (by C-G formula based on SCr of 0.59 mg/dL (L)).    No Known Allergies  Antimicrobials this admission: 9/11 cefepime >>  9/11 vancomycin >> 9/12  Dose adjustments this admission:   Microbiology results: 9/10 BCx: ngtd 9/10 COVID-19: Negative     Thank you for allowing pharmacy to be a part of this patient's care.  Ulice Dash, PharmD, BCPS [phone] 04/11/2020 12:30 PM

## 2020-04-11 NOTE — Transfer of Care (Signed)
Immediate Anesthesia Transfer of Care Note  Patient: Scott Conley  Procedure(s) Performed: ESOPHAGOGASTRODUODENOSCOPY (EGD) WITH PROPOFOL (N/A ) BIOPSY  Patient Location: Endoscopy Unit  Anesthesia Type:MAC  Level of Consciousness: drowsy and patient cooperative  Airway & Oxygen Therapy: Patient Spontanous Breathing and Patient connected to nasal cannula oxygen  Post-op Assessment: Report given to RN and Post -op Vital signs reviewed and stable  Post vital signs: Reviewed and stable  Last Vitals:  Vitals Value Taken Time  BP    Temp    Pulse    Resp 9 04/11/20 1022  SpO2    Vitals shown include unvalidated device data.  Last Pain:  Vitals:   04/11/20 0832  TempSrc: Oral  PainSc: 0-No pain         Complications: No complications documented.

## 2020-04-11 NOTE — Progress Notes (Signed)
PT Cancellation Note  Patient Details Name: JILES GOYA MRN: [id number] DOB: 01/26/1941   Cancelled Treatment:    Reason Eval/Treat Not Completed: Fatigue/lethargy limiting ability to participate Pt in endo this morning.  Pt now on unit however declines to participate at this time due to fatigue.   Lathan Gieselman,KATHrine E 04/11/2020, 11:35 AM Arlyce Dice, DPT Acute Rehabilitation Services Pager: [phone] Office: [phone]

## 2020-04-11 NOTE — Anesthesia Postprocedure Evaluation (Signed)
Anesthesia Post Note  Patient: Scott Conley  Procedure(s) Performed: ESOPHAGOGASTRODUODENOSCOPY (EGD) WITH PROPOFOL (N/A ) BIOPSY     Patient location during evaluation: Endoscopy Anesthesia Type: MAC Level of consciousness: awake and alert Pain management: pain level controlled Vital Signs Assessment: post-procedure vital signs reviewed and stable Respiratory status: spontaneous breathing, nonlabored ventilation, respiratory function stable and patient connected to nasal cannula oxygen Cardiovascular status: blood pressure returned to baseline and stable Postop Assessment: no apparent nausea or vomiting Anesthetic complications: no   No complications documented.  Last Vitals:  Vitals:   04/11/20 1020 04/11/20 1030  BP: (!) 104/57 107/66  Pulse: 80 80  Resp: 12 20  Temp: 36.7 C   SpO2: 98% 100%    Last Pain:  Vitals:   04/11/20 1020  TempSrc: Axillary  PainSc: 0-No pain                 Barnet [place]

## 2020-04-11 NOTE — Progress Notes (Signed)
SLP Cancellation Note  Patient Details Name: Scott Conley MRN: [id number] DOB: 06-Jul-1941   Cancelled treatment:       Reason Eval/Treat Not Completed: Other (comment) (pt at procedures)   Macario Golds 04/11/2020, 9:40 AM  Kathleen Lime, MS [place] Office [phone]

## 2020-04-11 NOTE — Op Note (Signed)
[facility] Patient Name: Scott Conley Procedure Date: 04/11/2020 MRN: [id number] Attending MD: Justice Britain , MD Date of Birth: [birth year] CSN: [id number] Age: 79 Admit Type: Inpatient Procedure:                Upper GI endoscopy Indications:              Dysphagia, Odynophagia, Abnormal PET scan of the GI                            tract (esophagus uptake) Providers:                Justice Britain, MD, Cleda Daub, RN, Fransico Setters                            Mbumina, Technician Referring MD:             [facility], Thornton Park MD, MD,                            Mauri Pole, MD, Willia Craze, Lajuan Lines.                            Pyrtle, MD Medicines:                Monitored Anesthesia Care Complications:            No immediate complications. Estimated Blood Loss:     Estimated blood loss was minimal. Procedure:                Pre-Anesthesia Assessment:                           - Prior to the procedure, a History and Physical                            was performed, and patient medications and                            allergies were reviewed. The patient's tolerance of                            previous anesthesia was also reviewed. The risks                            and benefits of the procedure and the sedation                            options and risks were discussed with the patient.                            All questions were answered, and informed consent                            was obtained. Prior Anticoagulants: The patient has  taken Lovenox (enoxaparin), last dose was 1 day                            prior to procedure. Patient is on aspirin. ASA                            Grade Assessment: III - A patient with severe                            systemic disease. After reviewing the risks and                            benefits, the patient was deemed in satisfactory                             condition to undergo the procedure.                           After obtaining informed consent, the endoscope was                            passed under direct vision. Throughout the                            procedure, the patient's blood pressure, pulse, and                            oxygen saturations were monitored continuously. The                            GIF-H190 ([id number]) Olympus gastroscope was                            introduced through the mouth, and advanced to the                            second part of duodenum. The upper GI endoscopy was                            accomplished without difficulty. The patient                            tolerated the procedure. Scope In: Scope Out: Findings:      No gross lesions were noted in the proximal esophagus.      LA Grade D (one or more mucosal breaks involving at least 75% of       esophageal circumference) esophagitis with bleeding was found in the       distal esophagus - 30 to 40 cm from the incisors. Biopsies were taken       with a cold forceps for histology.      A 5 cm hiatal hernia was found. The proximal extent of the gastric folds       (end of tubular esophagus) was 40 cm from the incisors. The hiatal  narrowing was 45 cm from the incisors. The Z-line was a variable       distance from incisors; the hiatal hernia was sliding.      Patchy moderate inflammation characterized by erosions, erythema,       friability and granularity was found in the gastric body, in the gastric       antrum and in the prepyloric region of the stomach.      One non-bleeding linear gastric ulcer with a clean ulcer base (Forrest       Class III) was found in the gastric antrum. The lesion was 8 mm in       largest dimension.      One 10 mm submucosal papule (nodule) with no stigmata of recent bleeding       was found in the gastric antrum.      No other gross lesions were noted in the entire examined stomach.       Biopsies  were taken with a cold forceps for histology and Helicobacter       pylori testing.      Patchy mildly erythematous mucosa without active bleeding and with no       stigmata of bleeding was found in the duodenal bulb.      No gross lesions were noted in the second portion of the duodenum.       Biopsies were taken with a cold forceps for histology. Impression:               - No gross lesions in esophagus proximally. LA                            Grade D esophagitis with bleeding distally -                            biopsied.                           - 5 cm hiatal hernia.                           - Gastritis. Non-bleeding gastric ulcer with a                            clean ulcer base (Forrest Class III).                           - One submucosal papule found in the stomach.                           - Biopsied stomach to rule out H. pylori.                           - Erythematous duodenopathy in bulb. No other gross                            lesions in the second portion of the duodenum.                            Biopsied. Moderate Sedation:  Not Applicable - Patient had care per Anesthesia. Recommendation:           - The patient will be observed post-procedure,                            until all discharge criteria are met.                           - Return patient to hospital ward for ongoing care.                           - Full liquid diet today.                           - Await pathology results.                           - IV PPI x 24-48 hours and then may transition to                            PO PPI BID.                           - Start Carafate 1 g TID + QHS if able to tolerate                            (do not take any medications 1 hour prior/after                            administration).                           - PPI Therapy should be maintained for next                            85-month and Carafate for at least nex 4-6 weeks.                             Repeat EGD should be performed in 2.5-3.5 months to                            ensure healing of esophagitis. Query close                            monitoring of the subepithelial lesion vs EUS to be                            considered, after follow up of esophagitis.                           - See how patient does over the next 24-36 hours.                            If his ability to take  in oral intake increases,                            then clear liquids and attempt at colonoscopy after                            laxative purge should be considered for his                            evaluation of his colon for the abnormal                            right-sided lesion. Not clear he will be a                            candidate for outpatient endoscopy center                            procedures in the near future, so if able to                            complete while in-house may be in his best                            interests. If this remains in the patient's goals                            of care and discussion with palliative medicine                            service.                           - The findings and recommendations were discussed                            with the patient.                           - Attempted to discuss the findings and                            recommendations with the patient's family.                           - The findings and recommendations were discussed                            with the referring physician. Procedure Code(s):        --- Professional ---                           [phone], Esophagogastroduodenoscopy, flexible,  transoral; with biopsy, single or multiple Diagnosis Code(s):        --- Professional ---                           K20.91, Esophagitis, unspecified with bleeding                           K44.9, Diaphragmatic hernia without obstruction or                             gangrene                           K29.70, Gastritis, unspecified, without bleeding                           K25.9, Gastric ulcer, unspecified as acute or                            chronic, without hemorrhage or perforation                           K31.89, Other diseases of stomach and duodenum                           R13.10, Dysphagia, unspecified                           R93.3, Abnormal findings on diagnostic imaging of                            other parts of digestive tract CPT copyright 2019 American Medical Association. All rights reserved. The codes documented in this report are preliminary and upon coder review may  be revised to meet current compliance requirements. Justice Britain, MD 04/11/2020 10:29:48 AM Number of Addenda: 0

## 2020-04-11 NOTE — Progress Notes (Signed)
Patient ID: Scott Conley, male   DOB: Nov 30, 1940, 79 y.o.   MRN: [id number]  PROGRESS NOTE    Scott Conley  MOQ:[id number] DOB: 12/12/1940 DOA: 04/07/2020 PCP: [place], FNP   Brief Narrative:  79 y.o. male with history of recently diagnosed non-small cell lung cancer on chemo and radiation presented to the ER with complaint of shortness of breath.  On presentation, he was tachypneic and tachycardic.  CT angiogram of the chest was negative for pulmonary embolism but did show features concerning for postobstructive pneumonia and was started on antibiotics.  COVID-19 test was negative.  Subsequently, GI was consulted for dysphagia.  Palliative care was also consulted for goals of care discussion.  Assessment & Plan:   Postobstructive pneumonia Acute hypoxic respiratory failure has been ruled out -Currently on cefepime.  Off vancomycin.  Cultures negative so far.  DC vancomycin.  COVID-19 testing negative on admission. -Currently on room air. -Diet as per SLP evaluation  Non-small cell lung cancer -Recently diagnosed; on chemo and radiation.  Outpatient follow-up with oncology. -Palliative care following for goals of care discussion.  Patient apparently remains full code -Patient thinks that he might not be able to tolerate next round of chemotherapy/radiation.  Communicated the same with Dr. Mohamed/oncology via secure chat who will follow-up with the patient as an outpatient -IR planning for left upper lobe lung nodule biopsy at some point  Dysphagia -GI consulted: Planning for EGD today.  Follow recommendations.  Confusion -Daughter's reported that the patient has been more confused lately as per nursing staff.  CT of the head was negative for any acute abnormality although showed chronic strokes.  Monitor mental status.  Fall precautions.  PT eval.  -Vitamin B12 and TSH levels normal  Folate deficiency -Folate level 1.9.  Started oral  supplementation.  Hypertension--continue Coreg.  Hyperlipidemia -Continue statin  Anemia of chronic disease -Likely from cancer and chemo.  Stable hemoglobin  Hypokalemia -Improved  diabetes mellitus type 2 -Continue CBGs with SSI  Generalized conditioning -PT eval  Poor oral intake -Nutrition eval   DVT prophylaxis: Lovenox Code Status: Full Family Communication: Patient at bedside Disposition Plan: Status is: Inpatient  Remains inpatient appropriate because:Inpatient level of care appropriate due to severity of illness.  GI is planning for EGD today.  Possible discharge in 1 to 2 days once cleared by GI.   Dispo: The patient is from: Home              Anticipated d/c is to: Home              Anticipated d/c date is: 1 day              Patient currently is not medically stable to d/c.    Consultants: None  Procedures: None  Antimicrobials: Cefepime  from 04/07/2020 onwards.  Vancomycin 04/07/2020-04/09/2020  Subjective: Patient seen and examined at bedside.  Still feels weak.  Denies worsening shortness of breath or cough.   Poor historian. Objective: Vitals:   04/10/20 1422 04/10/20 2015 04/10/20 2035 04/11/20 0629  BP: 138/83  122/79 134/87  Pulse: 82  86 90  Resp: 16  16 14   Temp: 97.7 F (36.5 C)  97.9 F (36.6 C) 98 F (36.7 C)  TempSrc: Oral  Oral Oral  SpO2: 97% 97% 97% 98%  Weight:    88.8 kg  Height:        Intake/Output Summary (Last 24 hours) at 04/11/2020 0801 Last data filed at 04/11/2020  0500 Gross per 24 hour  Intake 120 ml  Output 550 ml  Net -430 ml   Filed Weights   04/10/20 0500 04/11/20 0629  Weight: 88.9 kg 88.8 kg    Examination:  General exam: No acute distress.  Poor historian.  Elderly male lying in bed.  Currently on room air. Respiratory system: Bilateral decreased breath sounds at bases with some crackles.  No wheezing  cardiovascular system: S1-S2 heard, rate controlled Gastrointestinal system: Abdomen is  nondistended, soft and nontender.  Normal bowel sounds heard  extremities: No cyanosis.  mild lower extremity edema present Central nervous system: Alert and awake.  Poor historian.  No focal neurological deficits.  Moving extremities Skin: No obvious rashes/petechiae Psychiatry: Extremely flat affect  Data Reviewed: I have personally reviewed following labs and imaging studies  CBC: Recent Labs  Lab 04/04/20 1143 04/04/20 1143 04/07/20 2006 04/08/20 0416 04/09/20 0649 04/10/20 0554 04/11/20 0525  WBC 7.2  --  3.1* 3.3* 2.2* 2.3* 2.2*  NEUTROABS 5.4  --  2.5  --  1.5* 1.6* 1.4*  HGB 9.2*  --  9.6* 8.7* 7.9* 7.8* 7.7*  HCT 30.9*   < > 32.1* 28.4* 26.0* 26.1* 26.0*  MCV 77.1*   < > 77.5* 76.1* 76.9* 77.0* 78.5*  PLT 294  --  240 220 190 203 203   < > = values in this interval not displayed.   Basic Metabolic Panel: Recent Labs  Lab 04/07/20 2006 04/08/20 0416 04/09/20 0649 04/10/20 0554 04/11/20 0525  NA 138 139 135 139 135  K 4.3 4.0 3.3* 3.7 3.5  CL 103 106 105 110 106  CO2 17* 16* 19* 19* 17*  GLUCOSE 144* 141* 115* 128* 101*  BUN 13 13 10 12 10   CREATININE 0.82 0.64 0.49* 0.66 0.59*  CALCIUM 8.4* 7.5* 7.8* 7.9* 7.8*  MG  --   --  2.0 2.1 1.9   GFR: Estimated Creatinine Clearance: 90.4 mL/min (A) (by C-G formula based on SCr of 0.59 mg/dL (L)). Liver Function Tests: Recent Labs  Lab 04/04/20 1143 04/07/20 2006 04/10/20 0554  AST 9* 18 15  ALT 12 14 14   ALKPHOS 65 50 47  BILITOT 0.9 1.2 1.0  PROT 7.1 6.9 6.0*  ALBUMIN 2.2* 2.7* 2.2*   No results for input(s): LIPASE, AMYLASE in the last 168 hours. Recent Labs  Lab 04/10/20 0554  AMMONIA 15   Coagulation Profile: Recent Labs  Lab 04/07/20 2006  INR 1.2   Cardiac Enzymes: No results for input(s): CKTOTAL, CKMB, CKMBINDEX, TROPONINI in the last 168 hours. BNP (last 3 results) No results for input(s): PROBNP in the last 8760 hours. HbA1C: No results for input(s): HGBA1C in the last 72  hours. CBG: Recent Labs  Lab 04/10/20 0747 04/10/20 1202 04/10/20 1712 04/10/20 2033 04/11/20 0721  GLUCAP 123* 121* 118* 108* 100*   Lipid Profile: No results for input(s): CHOL, HDL, LDLCALC, TRIG, CHOLHDL, LDLDIRECT in the last 72 hours. Thyroid Function Tests: Recent Labs    04/10/20 0554  TSH 0.959   Anemia Panel: Recent Labs    04/10/20 0554  VITAMINB12 650  FOLATE 1.9*   Sepsis Labs: Recent Labs  Lab 04/07/20 2006 04/07/20 2325 04/09/20 0649  PROCALCITON  --   --  0.16  LATICACIDVEN 2.8* 2.4*  --     Recent Results (from the past 240 hour(s))  Culture, blood (routine x 2)     Status: None (Preliminary result)   Collection Time: 04/07/20  8:06 PM  Specimen: BLOOD  Result Value Ref Range Status   Specimen Description   Final    BLOOD LEFT ANTECUBITAL Performed at [facility] [address], [place], [place] [address]    Special Requests   Final    BOTTLES DRAWN AEROBIC AND ANAEROBIC Blood Culture results may not be optimal due to an excessive volume of blood received in culture bottles Performed at [facility] [address], [place], [place] [address]    Culture   Final    NO GROWTH 3 DAYS Performed at [facility], [place] [address], [place], [place] [address]    Report Status PENDING  Incomplete  Culture, blood (routine x 2)     Status: None (Preliminary result)   Collection Time: 04/07/20  8:11 PM   Specimen: BLOOD  Result Value Ref Range Status   Specimen Description   Final    BLOOD RIGHT ANTECUBITAL Performed at [facility] [address] [place], [place] [address]    Special Requests   Final    BOTTLES DRAWN AEROBIC AND ANAEROBIC Blood Culture results may not be optimal due to an excessive volume of blood received in culture bottles Performed at [facility] [address] [place], [place] [address]    Culture   Final    NO GROWTH 3 DAYS Performed  at [facility], [place] [address], [place], [place] [address]    Report Status PENDING  Incomplete  SARS Coronavirus 2 by RT PCR (hospital order, performed in [facility] hospital lab) Nasopharyngeal Nasopharyngeal Swab     Status: None   Collection Time: 04/07/20  8:15 PM   Specimen: Nasopharyngeal Swab  Result Value Ref Range Status   SARS Coronavirus 2 NEGATIVE NEGATIVE Final    Comment: (NOTE) SARS-CoV-2 target nucleic acids are NOT DETECTED.  The SARS-CoV-2 RNA is generally detectable in upper and lower respiratory specimens during the acute phase of infection. The lowest concentration of SARS-CoV-2 viral copies this assay can detect is 250 copies / mL. A negative result does not preclude SARS-CoV-2 infection and should not be used as the sole basis for treatment or other patient management decisions.  A negative result may occur with improper specimen collection / handling, submission of specimen other than nasopharyngeal swab, presence of viral mutation(s) within the areas targeted by this assay, and inadequate number of viral copies (<250 copies / mL). A negative result must be combined with clinical observations, patient history, and epidemiological information.  Fact Sheet for Patients:   StrictlyIdeas.no  Fact Sheet for Healthcare Providers: BankingDealers.co.za  This test is not yet approved or  cleared by the [place] FDA and has been authorized for detection and/or diagnosis of SARS-CoV-2 by FDA under an Emergency Use Authorization (EUA).  This EUA will remain in effect (meaning this test can be used) for the duration of the COVID-19 declaration under Section 564(b)(1) of the Act, 21 U.S.C. section 360bbb-3(b)(1), unless the authorization is terminated or revoked sooner.  Performed at [facility], [place] [address] [place], [place] [address]          Radiology Studies: CT HEAD WO  CONTRAST  Result Date: 04/09/2020 CLINICAL DATA:  Delirium.  Increased altered mental status. EXAM: CT HEAD WITHOUT CONTRAST TECHNIQUE: Contiguous axial images were obtained from the base of the skull through the vertex without intravenous contrast. COMPARISON:  Brain MRI 03/22/2020, head CT 11/26/2013. FINDINGS: Brain: The examination is significantly motion  degraded, limiting evaluation. Stable, moderate generalized parenchymal atrophy. Redemonstrated chronic cortically based infarcts within the right frontoparietal lobes. Stable, moderate ill-defined hypoattenuation within the cerebral white matter which is nonspecific, but consistent with chronic small vessel ischemic disease. No acute intracranial hemorrhage is identified. No acute demarcated cortical infarct is identified. No extra-axial fluid collection. No evidence of intracranial mass. No midline shift. Vascular: No hyperdense vessel.  Atherosclerotic calcifications Skull: Normal. Negative for fracture or focal lesion. Sinuses/Orbits: Visualized orbits show no acute finding. No significant paranasal sinus disease or mastoid effusion at the imaged levels. IMPRESSION: Significantly motion degraded examination, limiting evaluation. No acute intracranial abnormality is identified. Redemonstrated chronic cortically based infarcts within the right frontoparietal lobes. Stable background moderate generalized parenchymal atrophy and cerebral white matter chronic small vessel ischemic disease. Electronically Signed   By: Kellie Simmering DO   On: 04/09/2020 15:18        Scheduled Meds: . albuterol  2.5 mg Nebulization BID  . aspirin EC  81 mg Oral Daily  . atorvastatin  80 mg Oral Daily  . budesonide (PULMICORT) nebulizer solution  0.25 mg Nebulization BID  . carvedilol  6.25 mg Oral BID WC  . docusate sodium  200 mg Oral BID  . [START ON 04/12/2020] enoxaparin (LOVENOX) injection  40 mg Subcutaneous Q24H  . feeding supplement (ENSURE ENLIVE)  237 mL  Oral BID BM  . folic acid  1 mg Oral Daily  . influenza vaccine adjuvanted  0.5 mL Intramuscular Tomorrow-1000  . insulin aspart  0-9 Units Subcutaneous TID WC  . multivitamin with minerals  1 tablet Oral Daily  . pantoprazole  40 mg Oral Daily   Continuous Infusions: . ceFEPime (MAXIPIME) IV 2 g (04/11/20 0750)          Aline August, MD [facility] 04/11/2020, 8:01 AM

## 2020-04-11 NOTE — Progress Notes (Signed)
Spiritual care acknowledging consult for AD.  Pt is off floor for GI surgery currently.  Will follow with pt for completion of AD when back on floor.

## 2020-04-11 NOTE — Interval H&P Note (Signed)
History and Physical Interval Note:  04/11/2020 9:18 AM  Scott Conley  has presented today for surgery, with the diagnosis of dysphagia.  The various methods of treatment have been discussed with the patient and family. After consideration of risks, benefits and other options for treatment, the patient has consented to  Procedure(s): ESOPHAGOGASTRODUODENOSCOPY (EGD) WITH PROPOFOL (N/A) as a surgical intervention.  The patient's history has been reviewed, patient examined, no change in status, stable for surgery.  I have reviewed the patient's chart and labs.  Questions were answered to the patient's satisfaction.     [employer]

## 2020-04-12 ENCOUNTER — Ambulatory Visit
Admission: RE | Admit: 2020-04-12 | Discharge: 2020-04-12 | Disposition: A | Discharge status: Home / Self Care | Payer: Medicare HMO | Source: Ambulatory Visit | Attending: Radiation Oncology | Admitting: Radiation Oncology

## 2020-04-12 ENCOUNTER — Other Ambulatory Visit: Payer: Self-pay

## 2020-04-12 ENCOUNTER — Inpatient Hospital Stay (HOSPITAL_COMMUNITY): Payer: Medicare HMO

## 2020-04-12 ENCOUNTER — Encounter (HOSPITAL_COMMUNITY): Payer: Self-pay | Admitting: Internal Medicine

## 2020-04-12 ENCOUNTER — Other Ambulatory Visit: Payer: Self-pay | Admitting: Radiology

## 2020-04-12 DIAGNOSIS — R948 Abnormal results of function studies of other organs and systems: Secondary | ICD-10-CM

## 2020-04-12 DIAGNOSIS — R911 Solitary pulmonary nodule: Secondary | ICD-10-CM

## 2020-04-12 DIAGNOSIS — K2210 Ulcer of esophagus without bleeding: Secondary | ICD-10-CM

## 2020-04-12 DIAGNOSIS — C3431 Malignant neoplasm of lower lobe, right bronchus or lung: Secondary | ICD-10-CM | POA: Diagnosis not present

## 2020-04-12 DIAGNOSIS — R1319 Other dysphagia: Secondary | ICD-10-CM

## 2020-04-12 DIAGNOSIS — R1310 Dysphagia, unspecified: Secondary | ICD-10-CM

## 2020-04-12 LAB — CBC WITH DIFFERENTIAL/PLATELET
Abs Immature Granulocytes: 0.03 10*3/uL (ref 0.00–0.07)
Basophils Absolute: 0 10*3/uL (ref 0.0–0.1)
Basophils Relative: 0 %
Eosinophils Absolute: 0.1 10*3/uL (ref 0.0–0.5)
Eosinophils Relative: 2 %
HCT: 25.3 % — ABNORMAL LOW (ref 39.0–52.0)
Hemoglobin: 7.5 g/dL — ABNORMAL LOW (ref 13.0–17.0)
Immature Granulocytes: 1 %
Lymphocytes Relative: 19 %
Lymphs Abs: 0.5 10*3/uL — ABNORMAL LOW (ref 0.7–4.0)
MCH: 23.4 pg — ABNORMAL LOW (ref 26.0–34.0)
MCHC: 29.6 g/dL — ABNORMAL LOW (ref 30.0–36.0)
MCV: 78.8 fL — ABNORMAL LOW (ref 80.0–100.0)
Monocytes Absolute: 0.3 10*3/uL (ref 0.1–1.0)
Monocytes Relative: 12 %
Neutro Abs: 1.6 10*3/uL — ABNORMAL LOW (ref 1.7–7.7)
Neutrophils Relative %: 66 %
Platelets: 215 10*3/uL (ref 150–400)
RBC: 3.21 MIL/uL — ABNORMAL LOW (ref 4.22–5.81)
RDW: 22.3 % — ABNORMAL HIGH (ref 11.5–15.5)
WBC: 2.5 10*3/uL — ABNORMAL LOW (ref 4.0–10.5)
nRBC: 1.6 % — ABNORMAL HIGH (ref 0.0–0.2)

## 2020-04-12 LAB — BASIC METABOLIC PANEL
Anion gap: 12 (ref 5–15)
BUN: 14 mg/dL (ref 8–23)
CO2: 17 mmol/L — ABNORMAL LOW (ref 22–32)
Calcium: 7.9 mg/dL — ABNORMAL LOW (ref 8.9–10.3)
Chloride: 108 mmol/L (ref 98–111)
Creatinine, Ser: 0.61 mg/dL (ref 0.61–1.24)
GFR calc Af Amer: >60 mL/min (ref >60)
GFR calc non Af Amer: >60 mL/min (ref >60)
Glucose, Bld: 126 mg/dL — ABNORMAL HIGH (ref 70–99)
Potassium: 3.3 mmol/L — ABNORMAL LOW (ref 3.5–5.1)
Sodium: 137 mmol/L (ref 135–145)

## 2020-04-12 LAB — GLUCOSE, CAPILLARY
Glucose-Capillary: 114 mg/dL — ABNORMAL HIGH (ref 70–99)
Glucose-Capillary: 125 mg/dL — ABNORMAL HIGH (ref 70–99)
Glucose-Capillary: 145 mg/dL — ABNORMAL HIGH (ref 70–99)

## 2020-04-12 LAB — MAGNESIUM: Magnesium: 2.1 mg/dL (ref 1.7–2.4)

## 2020-04-12 MED ORDER — FENTANYL CITRATE (PF) 100 MCG/2ML IJ SOLN
INTRAMUSCULAR | Status: AC | PRN
  Administered 2020-04-12 (×2): 50 ug via INTRAVENOUS

## 2020-04-12 MED ORDER — SODIUM CHLORIDE 0.9 % IV SOLN
INTRAVENOUS | Status: AC
  Filled 2020-04-12: qty 250

## 2020-04-12 MED ORDER — POTASSIUM CHLORIDE 20 MEQ PO PACK
40.0000 meq | PACK | Freq: Once | ORAL | Status: AC
  Administered 2020-04-12: 40 meq via ORAL
  Filled 2020-04-12: qty 2

## 2020-04-12 MED ORDER — LIDOCAINE HCL (PF) 1 % IJ SOLN
INTRAMUSCULAR | Status: AC | PRN
  Administered 2020-04-12: 10 mL

## 2020-04-12 MED ORDER — FENTANYL CITRATE (PF) 100 MCG/2ML IJ SOLN
INTRAMUSCULAR | Status: AC
  Filled 2020-04-12: qty 2

## 2020-04-12 MED ORDER — MIDAZOLAM HCL 2 MG/2ML IJ SOLN
INTRAMUSCULAR | Status: AC
  Filled 2020-04-12: qty 4

## 2020-04-12 MED ORDER — TRAMADOL HCL 50 MG PO TABS
50.0000 mg | ORAL_TABLET | Freq: Four times a day (QID) | ORAL | Status: DC | PRN
  Administered 2020-04-15: 50 mg via ORAL
  Filled 2020-04-12: qty 1

## 2020-04-12 MED ORDER — KETOROLAC TROMETHAMINE 30 MG/ML IJ SOLN
15.0000 mg | Freq: Four times a day (QID) | INTRAMUSCULAR | Status: DC | PRN
  Administered 2020-04-12: 15 mg via INTRAVENOUS
  Filled 2020-04-12: qty 1

## 2020-04-12 MED ORDER — MIDAZOLAM HCL 2 MG/2ML IJ SOLN
INTRAMUSCULAR | Status: AC | PRN
  Administered 2020-04-12 (×2): 1 mg via INTRAVENOUS

## 2020-04-12 NOTE — Progress Notes (Signed)
PT Cancellation Note  Patient Details Name: Scott Conley MRN: [id number] DOB: July 27, 1941   Cancelled Treatment:    Reason Eval/Treat Not Completed: Patient going for procedure /biopsy soon. Will check back another time . [name] 04/12/2020, 12:33 PM  [facility] Pager [phone] Office [phone]

## 2020-04-12 NOTE — Plan of Care (Signed)
  Problem: Education: Goal: Knowledge of General Education information will improve Description: Including pain rating scale, medication(s)/side effects and non-pharmacologic comfort measures Outcome: Progressing   Problem: Clinical Measurements: Goal: Diagnostic test results will improve Outcome: Progressing   Problem: Clinical Measurements: Goal: Cardiovascular complication will be avoided Outcome: Progressing

## 2020-04-12 NOTE — Progress Notes (Addendum)
Progress Note  Chief Complaint:    dyphagia     ASSESSMENT / PLAN:     Attending physician's note   I have taken an interval history, reviewed the chart and examined the patient. I agree with the Advanced Practitioner's note, impression and recommendations.   79 year old male with non-small cell lung adenocarcinoma currently actively undergoing radiation and chemotherapy  Severe esophagitis likely etiology for abnormal PET/CT with hypermetabolic uptake in the distal esophagus.  No neoplastic lesion. Continue PPI twice daily and Carafate before meals and at bedtime Advance diet as tolerated  There will also increased hypermetabolism in the region of the ileocecal valve/possible soft tissue lesion in the cecum on PET/CT.   Patient does not want to proceed with colonoscopy at this point.  He does not feel he will be able to tolerate the bowel prep or the procedure.  He feels tired all the time, just wants to lay in bed and does not want to do anything right now.  Pancytopenia likely chemo induced Continue to monitor hemoglobin and transfuse as needed  Will need follow-up as outpatient with Dr Tarri Glenn to discuss colonoscopy for further evaluation of abnormal PET scan, if patient is willing to proceed with it.  GI will sign off, available if have any questions or concerns.   I have spent 35 minutes of patient care (this includes precharting, chart review, review of results, face-to-face time used for counseling as well as treatment plan and follow-up. The patient was provided an opportunity to ask questions and all were answered. The patient agreed with the plan and demonstrated an understanding of the instructions.  Damaris Hippo , MD [phone]     Brief History :  79 yo male with non-small cell lung adenocarcinoma Stage IIIB (  T3, N2, MO) undergoing chemo and radiation.    # Dysphagia / vague swallowing problems / weight loss / abnormal esophagus on PET  scan --Grade D esophagitis on EGD, may explain symptoms and does explain PET findings.   --Limited historian . Denies food getting stuck. Says that sometimes swallowing takes his breath away and that is as descriptive as it gets. Hypermetabolism in distal esophagus on PET scan.  --Continue BID PPI, transition to PO tomorrow --Continue carafafe --Follow up EGD in ~ 3 months to ensure healing of esophagitis.   # Submucosal gastric papule --Consider EUS at some point  # Gastritis, biopsies to rule out H.pylori are pending.   # Abnormal right colon in area of ICV on PET scan  --Noncontrast CT imaging for attenuation correction raises concern for a soft tissue lesion at this location. --Needs colonoscopy but patient says he cannot drink the prep, at least not today.   # Non -small cell lung cancer --recently diagnosed. On Chemo and radiation   # Acute on chronic anemia ( microcytic). Hgb 7.5, down from 9.2 on 04/04/20.  --Doesn't appear iron deficient on recent labs. --Declining hgb chemo induced?          SUBJECTIVE:    No complaints. We discussed proceeding with colonoscopy tomorrow. Patient says he would be unable to complete a bowel prep today. Wants to hold off     OBJECTIVE:   EGD 04/11/20 - No gross lesions in esophagus proximally. LA Grade D esophagitis with bleeding distally -biopsied. - 5 cm hiatal hernia. - Gastritis. Non-bleeding gastric ulcer with a clean ulcer base ([place] Class III). - One submucosal papule found in the stomach. - Biopsied stomach to rule  out H. pylori. - Erythematous duodenopathy in bulb. No other gross lesions in the second portion of the duodenum. Biopsied   Moderate Sedation: - The patient will be observed post-procedure, until all discharge criteria are met. - Return patient to hospital ward for ongoing care. - Full liquid diet today. - Await pathology results. - IV PPI x 24-48 hours and then may transition to PO PPI BID. - Start Carafate  1 g TID + QHS if able to tolerate (do not take any medications 1 hour prior/after administration). - PPI Therapy should be maintained for next 41-month and Carafate for at least nex 4- 6 weeks. Repeat EGD should be performed in 2.5-3.5 months to ensure healing of esophagitis. Query close monitoring of the subepithelial lesion vs EUS to be considered, after follow up of esophagitis. - See how patient does over the next 24-36 hours. If his ability to take in oral intake increases, then clear liquids and attempt at colonoscopy after laxative purge should be considered for his evaluation of his colon for the abnormal right-sided lesion. Not clear he will be a candidate for outpatient endoscopy center procedures in the near future, so if able to complete while in-house may be in his best interests. If this remains in the patient's goals of care and discussion with palliative medicine service. - The findings and recommendations were discussed with the patient. - Attempted   Scheduled inpatient medications:  . albuterol  2.5 mg Nebulization BID  . aspirin EC  81 mg Oral Daily  . atorvastatin  80 mg Oral Daily  . budesonide (PULMICORT) nebulizer solution  0.25 mg Nebulization BID  . carvedilol  6.25 mg Oral BID WC  . docusate sodium  200 mg Oral BID  . [START ON 04/13/2020] enoxaparin (LOVENOX) injection  40 mg Subcutaneous Q24H  . feeding supplement (ENSURE ENLIVE)  237 mL Oral BID BM  . folic acid  1 mg Oral Daily  . influenza vaccine adjuvanted  0.5 mL Intramuscular Tomorrow-1000  . insulin aspart  0-9 Units Subcutaneous TID WC  . multivitamin with minerals  1 tablet Oral Daily  . pantoprazole (PROTONIX) IV  40 mg Intravenous Q12H  . potassium chloride  40 mEq Oral Once  . sucralfate  1 g Oral TID WC & HS   Continuous inpatient infusions:  . ceFEPime (MAXIPIME) IV 2 g (04/12/20 0847)   PRN inpatient medications: acetaminophen **OR** acetaminophen, albuterol, ketorolac, ondansetron  **OR** ondansetron (ZOFRAN) IV, traMADol  Vital signs in last 24 hours: Temp:  [97.5 F (36.4 C)-98.4 F (36.9 C)] 97.9 F (36.6 C) (09/15 0527) Pulse Rate:  [82-88] 83 (09/15 0527) Resp:  [15-18] 18 (09/15 0527) BP: (129-148)/(72-80) 144/80 (09/15 0527) SpO2:  [98 %] 98 % (09/15 0839) Weight:  [89.7 kg] 89.7 kg (09/14 2020) Last BM Date: 04/11/20  Intake/Output Summary (Last 24 hours) at 04/12/2020 1049 Last data filed at 04/12/2020 1019 Gross per 24 hour  Intake 480 ml  Output 500 ml  Net -20 ml     Physical Exam:  . General: Alert, male in NAD . Heart:  Regular rate  . Pulmonary: Normal respiratory effort . Abdomen: Soft, nondistended, Nontender. Normal bowel sounds. No masses felt. . Neurologic: Alert and oriented . Psych: Pleasant. Cooperative.   Filed Weights   04/11/20 0629 04/11/20 0832 04/11/20 2020  Weight: 88.8 kg 89.8 kg 89.7 kg    Intake/Output from previous day: 09/14 0701 - 09/15 0700 In: 750[P.O.:480; I.V.:300] Out: 350 [Urine:350] Intake/Output this shift: Total  I/O In: 0  Out: 150 [Urine:150]    Lab Results: Recent Labs    04/10/20 0554 04/11/20 0525 04/12/20 0512  WBC 2.3* 2.2* 2.5*  HGB 7.8* 7.7* 7.5*  HCT 26.1* 26.0* 25.3*  PLT 203 203 215   BMET Recent Labs    04/10/20 0554 04/11/20 0525 04/12/20 0512  NA 139 135 137  K 3.7 3.5 3.3*  CL 110 106 108  CO2 19* 17* 17*  GLUCOSE 128* 101* 126*  BUN '12 10 14  ' CREATININE 0.66 0.59* 0.61  CALCIUM 7.9* 7.8* 7.9*   LFT Recent Labs    04/10/20 0554  PROT 6.0*  ALBUMIN 2.2*  AST 15  ALT 14  ALKPHOS 47  BILITOT 1.0   PT/INR No results for input(s): LABPROT, INR in the last 72 hours. Hepatitis Panel No results for input(s): HEPBSAG, HCVAB, HEPAIGM, HEPBIGM in the last 72 hours.  No results found.    Principal Problem:   Postobstructive pneumonia Active Problems:   Hypertension   Malignant neoplasm of bronchus of right lower lobe ([place])   Controlled type 2 diabetes  mellitus with hyperglycemia ([place])     LOS: 4 days   Tye Savoy ,NP 04/12/2020, 10:49 AM

## 2020-04-12 NOTE — Progress Notes (Signed)
Situation: Chaplain Medinas-Lockley received referral from Maudie Flakes to follow-up with pt Scott Conley regarding AD paperwork.  Background: Scott Conley shared that he had filled out the paperwork and has not yet had it notarized. Scott Conley shared that he has several procedures today and "would like to get it done some other time."   Actions & Assessments: Scott Conley seems somewhat overwhelmed by his upcoming procedures and would like to sign the AD paperwork at a later time. Chaplain ensured that he can get this done when he is ready and to let his nurse know at that time.  Recommendations: Please page chaplain [phone]) when Scott Conley is ready to sign paperwork.  Chaplain remains available for follow-up spiritual/emotional support as needed.  Chaplain [employer], MDiv

## 2020-04-12 NOTE — Progress Notes (Signed)
Pt c/o burning pain in left upper arm 10/10. Upon assessment no redness or swelling noted. ROM is limited due to pain. MD is paged via secure chat and received new orders.

## 2020-04-12 NOTE — Progress Notes (Addendum)
SLP Cancellation Note  Patient Details Name: Scott Conley MRN: [id number] DOB: 06/19/1941   Cancelled treatment:       Reason Eval/Treat Not Completed: Other (comment);Medical issues which prohibited therapy (pt underwent lung biopsy today and per notes minimal coughing, talking, etc advised by operating MD.)  Will follow up next date.   Kathleen Lime, MS [facility] SLP Acute Rehab Services Office [phone]  Macario Golds 04/12/2020, 2:01 PM

## 2020-04-12 NOTE — Procedures (Signed)
Interventional Radiology Procedure Note  Procedure: CT guided biopsy of LUL pleural based pulmonary nodule.  Complications: No immediate Recommendations: - Bedrest until CXR cleared.  Minimize talking, coughing or otherwise straining.  - Follow up 1 hr CXR pending    Signed,  Criselda Peaches, MD

## 2020-04-12 NOTE — Progress Notes (Signed)
MEDICATION-RELATED CONSULT NOTE   IR Procedure Consult - Anticoagulant/Antiplatelet PTA/Inpatient Med List Review by Pharmacist    Procedure: Pulmonary nodule biopsy    Completed: 1301  Post-Procedural bleeding risk per IR MD assessment:  standard  Antithrombotic medications on inpatient or PTA profile prior to procedure:   Lovenox 40 mg daily    Recommended restart time per IR Post-Procedure Guidelines:  Next AM   Other considerations:   Hemoglobin low but stable, anemia of chronic disease, on chemotherapy   Plan:    Agree with resuming Lovenox 40 mg daily tomorrow AM as ordered. Will follow remotely for renal function and s/s of bleeding.  Thank you for the consult.   Ulice Dash, PharmD, BCPS [phone]

## 2020-04-12 NOTE — Progress Notes (Signed)
PROGRESS NOTE    Scott Conley  KGM:[id number] DOB: May 30, 1941 DOA: 04/07/2020 PCP: [facility], FNP    Chief Complaint  Patient presents with  . Shortness of Breath    Brief Narrative:  79 y.o.malewithhistory of recently diagnosed non-small cell lung cancer on chemo and radiation presented to the ER with complaint of shortness of breath.  On presentation, he was tachypneic and tachycardic.  CT angiogram of the chest was negative for pulmonary embolism but did show features concerning for postobstructive pneumonia and was started on antibiotics.  COVID-19 test was negative.  Subsequently, GI was consulted for dysphagia.  Palliative care was also consulted for goals of care discussion.   Assessment & Plan:   Principal Problem:   Postobstructive pneumonia Active Problems:   Hypertension   Malignant neoplasm of bronchus of right lower lobe ([facility])   Controlled type 2 diabetes mellitus with hyperglycemia ([place])  1 postobstructive pneumonia Patient with history of lung cancer, had presented with shortness of breath and on presentation noted to be tachypneic, tachycardic.  CT angiogram chest done was negative for PE however concerning for postobstructive pneumonia and patient placed empirically on IV antibiotics.  COVID-19 PCR negative.  Patient improving clinically.  Was empirically on IV vancomycin IV cefepime however IV vancomycin has been discontinued.  Blood cultures with no growth to date.  Currently with sats of 98% on room air.  Continue IV cefepime and likely narrow down to IV Rocephin in the next 24 hours and subsequently oral antibiotics to complete a 5 to 7-day course of antibiotic treatment.  Continue Pulmicort, scheduled nebs.  Supportive care.  2.  Non-small cell lung cancer/left upper lobe nodule Patient noted to have been recently diagnosed with non-small cell lung cancer on chemotherapy and radiation treatment.  Being followed by oncology in the outpatient setting.   Patient currently full code.  Per Dr. Starla Link patient thinks he might not be able to tolerate next round of chemo/radiation.  This information was communicated with patient's oncologist Dr. Lorna Few via secure chat will follow up with patient in the outpatient setting.  Patient being followed by IR and patient for left upper lobe biopsy to be done today.  3.  Dysphagia likely secondary to the severe esophagitis Patient with complaints of dysphagia early on during the hospitalization.  Patient noted to have had a PET scan done in the outpatient setting which showed an abnormal esophagus.  Patient underwent upper endoscopy 04/11/2020 that showed grade D esophagitis.  Patient currently n.p.o. in anticipation of biopsy.  Post biopsy will place on a full liquid diet for the next 24 hours and subsequently transition to a soft diet.  Patient being followed by GI who are recommending continuation of twice daily PPI and likely transition to oral PPI in the next 24 hours.  Continue Carafate.  Will need follow-up EGD in approximately 3 months to ensure healing of esophagitis.  Appreciate GI input and recommendations.  4.  Submucosal gastric papule We will need outpatient follow-up and evaluation by GI.  5.  Gastritis Noted on upper endoscopy.  Biopsies taken which are pending.  Continue PPI, Carafate.  Per GI.  6.  Abnormal right colon area of the IVC on PET scan Noncontrast CT imaging for attenuation raise concern of soft tissue lesion.  Patient needing colonoscopy but stated to GI unable to tolerate prep at this time.  Per GI.  7.  Confusion It was noted per Dr. Starla Link that patient daughter had reported more confusion  lately as per nursing staff.  Head CT done negative for any acute abnormality however showed chronic strokes.  Mental status has improved.  Vitamin B12 TSH levels within normal limit.  Supportive care.  8.  Folate deficiency Folate level at 1.9.  Continue folic acid daily.  Outpatient  follow-up.  9.  Hypertension Continue Coreg.  10.  Hyperlipidemia Continue statin.  12.  Anemia of chronic disease Likely secondary to cancer and chemotherapy.  Patient with no overt bleeding.  Status post upper endoscopy with grade D esophagitis.  On PPI.  Hemoglobin currently stable at 7.5.  Transfusion threshold hemoglobin < 7.  13.  Hypokalemia Replete.  14.  Diabetes mellitus type 2 Hemoglobin A1c 6.6 on 03/04/2020.  CBG 114 this morning.  Patient currently n.p.o. in anticipation of procedure.  Sliding scale insulin.  #15 deconditioning PT evaluation pending.     DVT prophylaxis: Was on Lovenox currently on hold in anticipation of left upper lobe biopsy. Code Status: Full Family Communication: Updated patient.  No family at bedside. Disposition:   Status is: Inpatient    Dispo: The patient is from: Home              Anticipated d/c is to: Home versus SNF              Anticipated d/c date is: 3 to 4 days              Patient currently on IV antibiotics for postobstructive pneumonia, to undergo left upper lobe biopsy today, not stable for discharge.       Consultants:   Interventional radiology: Dr. Laurence Ferrari 04/10/2020  Gastroenterology: Dr. Tarri Glenn 04/10/2020  Palliative care consultation: Dr. Domingo Cocking 04/09/2020  Procedures:   Upper endoscopy 04/11/2020 per Dr. Rush Landmark  Left upper lobe CT-guided biopsy per IR pending 04/12/2020  CT head without contrast 04/09/2020  CT angiogram chest 04/07/2020  Chest x-ray 04/07/2020, 04/12/2020  Antimicrobials:  IV cefepime 04/07/2020>>>>>  IV vancomycin 04/07/2020>>>>> 04/08/2020   Subjective: Patient laying in bed.  Denies any significant chest pain.  Denies any significant shortness of breath.  Stated he tolerated full liquid diet.  Somewhat overwhelmed about procedures he supposed to have today.  Objective: Vitals:   04/11/20 1952 04/11/20 2020 04/12/20 0527 04/12/20 0839  BP:  129/72 (!) 144/80   Pulse:  83  83   Resp:  18 18   Temp:  (!) 97.5 F (36.4 C) 97.9 F (36.6 C)   TempSrc:  Oral Oral   SpO2: 98% 98% 98% 98%  Weight:  89.7 kg    Height:        Intake/Output Summary (Last 24 hours) at 04/12/2020 1128 Last data filed at 04/12/2020 1019 Gross per 24 hour  Intake 480 ml  Output 500 ml  Net -20 ml   Filed Weights   04/11/20 0629 04/11/20 0832 04/11/20 2020  Weight: 88.8 kg 89.8 kg 89.7 kg    Examination:  General exam: Appears calm and comfortable  Respiratory system: Decreased breath sounds in the bases.  No wheezing.  Speaking in full sentences.  Normal respiratory effort.  Cardiovascular system: S1 & S2 heard, RRR. No JVD, murmurs, rubs, gallops or clicks. No pedal edema. Gastrointestinal system: Abdomen is nondistended, soft and nontender. No organomegaly or masses felt. Normal bowel sounds heard. Central nervous system: Alert and oriented. No focal neurological deficits. Extremities: Symmetric 5 x 5 power. Skin: No rashes, lesions or ulcers Psychiatry: Judgement and insight appear normal. Mood & affect appropriate.  Data Reviewed: I have personally reviewed following labs and imaging studies  CBC: Recent Labs  Lab 04/07/20 2006 04/07/20 2006 04/08/20 0416 04/09/20 0649 04/10/20 0554 04/11/20 0525 04/12/20 0512  WBC 3.1*   < > 3.3* 2.2* 2.3* 2.2* 2.5*  NEUTROABS 2.5  --   --  1.5* 1.6* 1.4* 1.6*  HGB 9.6*   < > 8.7* 7.9* 7.8* 7.7* 7.5*  HCT 32.1*   < > 28.4* 26.0* 26.1* 26.0* 25.3*  MCV 77.5*   < > 76.1* 76.9* 77.0* 78.5* 78.8*  PLT 240   < > 220 190 203 203 215   < > = values in this interval not displayed.    Basic Metabolic Panel: Recent Labs  Lab 04/08/20 0416 04/09/20 0649 04/10/20 0554 04/11/20 0525 04/12/20 0512  NA 139 135 139 135 137  K 4.0 3.3* 3.7 3.5 3.3*  CL 106 105 110 106 108  CO2 16* 19* 19* 17* 17*  GLUCOSE 141* 115* 128* 101* 126*  BUN 13 10 12 10 14   CREATININE 0.64 0.49* 0.66 0.59* 0.61  CALCIUM 7.5* 7.8* 7.9* 7.8* 7.9*    MG  --  2.0 2.1 1.9 2.1    GFR: Estimated Creatinine Clearance: 88 mL/min (by C-G formula based on SCr of 0.61 mg/dL).  Liver Function Tests: Recent Labs  Lab 04/07/20 2006 04/10/20 0554  AST 18 15  ALT 14 14  ALKPHOS 50 47  BILITOT 1.2 1.0  PROT 6.9 6.0*  ALBUMIN 2.7* 2.2*    CBG: Recent Labs  Lab 04/11/20 0721 04/11/20 1130 04/11/20 1740 04/11/20 2224 04/12/20 0736  GLUCAP 100* 118* 139* 130* 114*     Recent Results (from the past 240 hour(s))  Culture, blood (routine x 2)     Status: None (Preliminary result)   Collection Time: 04/07/20  8:06 PM   Specimen: BLOOD  Result Value Ref Range Status   Specimen Description   Final    BLOOD LEFT ANTECUBITAL Performed at [facility], [place] [address] [place], [place] [address]    Special Requests   Final    BOTTLES DRAWN AEROBIC AND ANAEROBIC Blood Culture results may not be optimal due to an excessive volume of blood received in culture bottles Performed at [place] [address], [place], [place] [address]    Culture   Final    NO GROWTH 3 DAYS Performed at [facility], [place] [address] [place], [place] [address]    Report Status PENDING  Incomplete  Culture, blood (routine x 2)     Status: None (Preliminary result)   Collection Time: 04/07/20  8:11 PM   Specimen: BLOOD  Result Value Ref Range Status   Specimen Description   Final    BLOOD RIGHT ANTECUBITAL Performed at [facility] [address] [place], [place] [address]    Special Requests   Final    BOTTLES DRAWN AEROBIC AND ANAEROBIC Blood Culture results may not be optimal due to an excessive volume of blood received in culture bottles Performed at [facility] [address] [place], [place] [address]    Culture   Final    NO GROWTH 3 DAYS Performed at [facility], [place] [address], [place], [place] [address]    Report Status PENDING  Incomplete   SARS Coronavirus 2 by RT PCR (hospital order, performed in [facility] hospital lab) Nasopharyngeal Nasopharyngeal Swab     Status: None   Collection  Time: 04/07/20  8:15 PM   Specimen: Nasopharyngeal Swab  Result Value Ref Range Status   SARS Coronavirus 2 NEGATIVE NEGATIVE Final    Comment: (NOTE) SARS-CoV-2 target nucleic acids are NOT DETECTED.  The SARS-CoV-2 RNA is generally detectable in upper and lower respiratory specimens during the acute phase of infection. The lowest concentration of SARS-CoV-2 viral copies this assay can detect is 250 copies / mL. A negative result does not preclude SARS-CoV-2 infection and should not be used as the sole basis for treatment or other patient management decisions.  A negative result may occur with improper specimen collection / handling, submission of specimen other than nasopharyngeal swab, presence of viral mutation(s) within the areas targeted by this assay, and inadequate number of viral copies (<250 copies / mL). A negative result must be combined with clinical observations, patient history, and epidemiological information.  Fact Sheet for Patients:   StrictlyIdeas.no  Fact Sheet for Healthcare Providers: BankingDealers.co.za  This test is not yet approved or  cleared by the [place] FDA and has been authorized for detection and/or diagnosis of SARS-CoV-2 by FDA under an Emergency Use Authorization (EUA).  This EUA will remain in effect (meaning this test can be used) for the duration of the COVID-19 declaration under Section 564(b)(1) of the Act, 21 U.S.C. section 360bbb-3(b)(1), unless the authorization is terminated or revoked sooner.  Performed at [facility], [place] [address], [place], [place] [address]          Radiology Studies: No results found.      Scheduled Meds: . albuterol  2.5 mg Nebulization BID  . aspirin EC  81 mg Oral Daily  .  atorvastatin  80 mg Oral Daily  . budesonide (PULMICORT) nebulizer solution  0.25 mg Nebulization BID  . carvedilol  6.25 mg Oral BID WC  . docusate sodium  200 mg Oral BID  . [START ON 04/13/2020] enoxaparin (LOVENOX) injection  40 mg Subcutaneous Q24H  . feeding supplement (ENSURE ENLIVE)  237 mL Oral BID BM  . folic acid  1 mg Oral Daily  . influenza vaccine adjuvanted  0.5 mL Intramuscular Tomorrow-1000  . insulin aspart  0-9 Units Subcutaneous TID WC  . multivitamin with minerals  1 tablet Oral Daily  . pantoprazole (PROTONIX) IV  40 mg Intravenous Q12H  . potassium chloride  40 mEq Oral Once  . sucralfate  1 g Oral TID WC & HS   Continuous Infusions: . ceFEPime (MAXIPIME) IV 2 g (04/12/20 0847)     LOS: 4 days    Time spent: 35 minutes    Irine Seal, MD [facility]   To contact the attending provider between 7A-7P or the covering provider during after hours 7P-7A, please log into the web site [email or web address] and access using universal [place] password for that web site. If you do not have the password, please call the hospital operator.  04/12/2020, 11:28 AM

## 2020-04-12 NOTE — Care Management Important Message (Signed)
Important Message  Patient Details IM Letter given to the Patient Name: Scott Conley MRN: [id number] Date of Birth: [birth year]   Medicare Important Message Given:  Yes     [name] 04/12/2020, 10:43 AM

## 2020-04-13 ENCOUNTER — Ambulatory Visit
Admission: RE | Admit: 2020-04-13 | Discharge: 2020-04-13 | Disposition: A | Discharge status: Home / Self Care | Payer: Medicare HMO | Source: Ambulatory Visit | Attending: Radiation Oncology | Admitting: Radiation Oncology

## 2020-04-13 ENCOUNTER — Ambulatory Visit (HOSPITAL_COMMUNITY): Admission: RE | Admit: 2020-04-13 | Payer: Medicare HMO | Source: Ambulatory Visit

## 2020-04-13 ENCOUNTER — Telehealth: Payer: Self-pay | Admitting: Internal Medicine

## 2020-04-13 DIAGNOSIS — K2990 Gastroduodenitis, unspecified, without bleeding: Secondary | ICD-10-CM

## 2020-04-13 DIAGNOSIS — Z9889 Other specified postprocedural states: Secondary | ICD-10-CM

## 2020-04-13 DIAGNOSIS — R0600 Dyspnea, unspecified: Secondary | ICD-10-CM

## 2020-04-13 LAB — CBC WITH DIFFERENTIAL/PLATELET
Abs Immature Granulocytes: 0.03 10*3/uL (ref 0.00–0.07)
Basophils Absolute: 0 10*3/uL (ref 0.0–0.1)
Basophils Relative: 1 %
Eosinophils Absolute: 0.1 10*3/uL (ref 0.0–0.5)
Eosinophils Relative: 2 %
HCT: 25 % — ABNORMAL LOW (ref 39.0–52.0)
Hemoglobin: 7.5 g/dL — ABNORMAL LOW (ref 13.0–17.0)
Immature Granulocytes: 1 %
Lymphocytes Relative: 18 %
Lymphs Abs: 0.5 10*3/uL — ABNORMAL LOW (ref 0.7–4.0)
MCH: 23.6 pg — ABNORMAL LOW (ref 26.0–34.0)
MCHC: 30 g/dL (ref 30.0–36.0)
MCV: 78.6 fL — ABNORMAL LOW (ref 80.0–100.0)
Monocytes Absolute: 0.4 10*3/uL (ref 0.1–1.0)
Monocytes Relative: 14 %
Neutro Abs: 1.7 10*3/uL (ref 1.7–7.7)
Neutrophils Relative %: 64 %
Platelets: 196 10*3/uL (ref 150–400)
RBC: 3.18 MIL/uL — ABNORMAL LOW (ref 4.22–5.81)
RDW: 22.5 % — ABNORMAL HIGH (ref 11.5–15.5)
WBC: 2.6 10*3/uL — ABNORMAL LOW (ref 4.0–10.5)
nRBC: 1.5 % — ABNORMAL HIGH (ref 0.0–0.2)

## 2020-04-13 LAB — CULTURE, BLOOD (ROUTINE X 2)
Culture: NO GROWTH
Culture: NO GROWTH

## 2020-04-13 LAB — GLUCOSE, CAPILLARY
Glucose-Capillary: 107 mg/dL — ABNORMAL HIGH (ref 70–99)
Glucose-Capillary: 144 mg/dL — ABNORMAL HIGH (ref 70–99)
Glucose-Capillary: 124 mg/dL — ABNORMAL HIGH (ref 70–99)
Glucose-Capillary: 121 mg/dL — ABNORMAL HIGH (ref 70–99)

## 2020-04-13 LAB — BASIC METABOLIC PANEL
Anion gap: 9 (ref 5–15)
BUN: 9 mg/dL (ref 8–23)
CO2: 18 mmol/L — ABNORMAL LOW (ref 22–32)
Calcium: 7.8 mg/dL — ABNORMAL LOW (ref 8.9–10.3)
Chloride: 109 mmol/L (ref 98–111)
Creatinine, Ser: 0.61 mg/dL (ref 0.61–1.24)
GFR calc Af Amer: >60 mL/min (ref >60)
GFR calc non Af Amer: >60 mL/min (ref >60)
Glucose, Bld: 110 mg/dL — ABNORMAL HIGH (ref 70–99)
Potassium: 3.1 mmol/L — ABNORMAL LOW (ref 3.5–5.1)
Sodium: 136 mmol/L (ref 135–145)

## 2020-04-13 LAB — MAGNESIUM: Magnesium: 1.9 mg/dL (ref 1.7–2.4)

## 2020-04-13 LAB — SURGICAL PATHOLOGY

## 2020-04-13 MED ORDER — SODIUM CHLORIDE 0.9 % IV SOLN
2.0000 g | INTRAVENOUS | Status: DC
  Administered 2020-04-13: 2 g via INTRAVENOUS
  Filled 2020-04-13: qty 2

## 2020-04-13 MED ORDER — POTASSIUM CHLORIDE 20 MEQ PO PACK
40.0000 meq | PACK | ORAL | Status: AC
  Administered 2020-04-13 (×2): 40 meq via ORAL
  Filled 2020-04-13: qty 2

## 2020-04-13 NOTE — Plan of Care (Signed)

## 2020-04-13 NOTE — Progress Notes (Addendum)
PROGRESS NOTE    Scott Conley  TIW:[id number] DOB: [birth year] DOA: 04/07/2020 PCP: Golden Circle, FNP    Chief Complaint  Patient presents with  . Shortness of Breath    Brief Narrative:  79 y.o.malewithhistory of recently diagnosed non-small cell lung cancer on chemo and radiation presented to the ER with complaint of shortness of breath.  On presentation, he was tachypneic and tachycardic.  CT angiogram of the chest was negative for pulmonary embolism but did show features concerning for postobstructive pneumonia and was started on antibiotics.  COVID-19 test was negative.  Subsequently, GI was consulted for dysphagia.  Palliative care was also consulted for goals of care discussion.   Assessment & Plan:   Principal Problem:   Postobstructive pneumonia Active Problems:   Hypertension   Malignant neoplasm of bronchus of right lower lobe (HCC)   Controlled type 2 diabetes mellitus with hyperglycemia (HCC)   Abnormal PET scan of colon   Erosive esophagitis   Esophageal dysphagia   Nodule of left lung  1 postobstructive pneumonia Patient with history of lung cancer, had presented with shortness of breath and on presentation noted to be tachypneic, tachycardic.  CT angiogram chest done was negative for PE however concerning for postobstructive pneumonia and patient placed empirically on IV antibiotics.  COVID-19 PCR negative.  Clinical improvement.  Patient initially was empirically on IV vancomycin, IV cefepime.  IV Vanco has been discontinued.  Blood cultures with no growth to date.  Hypoxia improving with sats of 96% on room air.  Narrow IV cefepime to IV Rocephin to complete a 7 to 10-day course of antibiotic treatment.  Continue scheduled nebs, Pulmicort.  Supportive care.  2.  Non-small cell lung cancer/left upper lobe nodule Patient noted to have been recently diagnosed with non-small cell lung cancer on chemotherapy and radiation treatment.  Being followed by oncology  in the outpatient setting.  Patient currently full code.  Per Dr. Starla Link patient thinks he might not be able to tolerate next round of chemo/radiation.  This information was communicated with patient's oncologist Dr. Lorna Few via secure chat will follow up with patient in the outpatient setting.  Patient being followed by IR and patient status post left upper lobe biopsy 04/12/2020.  Outpatient follow-up with oncology.   3.  Dysphagia likely secondary to the severe esophagitis Patient with complaints of dysphagia early on during the hospitalization.  Patient noted to have had a PET scan done in the outpatient setting which showed an abnormal esophagus.  Patient underwent upper endoscopy 04/11/2020 that showed grade D esophagitis.  Patient status post left upper lobe biopsy.  Patient tolerating clear liquids and has been advanced to a full liquid diet.  If patient tolerates full liquid diet will advance to a soft diet.   Patient being followed by GI who are recommending continuation of twice daily PPI and transition to oral PPI. Continue Carafate.  Will need follow-up EGD in approximately 3 months to ensure healing of esophagitis.  Appreciate GI input and recommendations.  4.  Submucosal gastric papule We will need outpatient follow-up and evaluation by GI.  5.  Gastritis/ulcerative esophagitis/reactive gastropathy/peptic duodenitis Noted on upper endoscopy.  Biopsies taken which showed peptic duodenitis, mild chronic gastritis, reactive gastropathy, Warthin-Starry stain negative for H. pylori, ulcerative esophagitis, HSV CMV stains are negative, PAS/F stain is negative.  Continue PPI.  6.  Abnormal right colon area of the IVC on PET scan Noncontrast CT imaging for attenuation raise concern of soft tissue lesion.  Patient needing colonoscopy but stated to GI unable to tolerate prep at this time.  Will need outpatient follow-up.  GI was following but have signed off.   7.  Confusion It was noted  per Dr. Starla Link that patient daughter had reported more confusion lately as per nursing staff.  Head CT done negative for any acute abnormality however showed chronic strokes.  Mental status improvement.  Vitamin B12, TSH within normal limits.  Supportive care.   8.  Folate deficiency Folate level at 1.9.  Folic acid daily.  Outpatient follow-up.   9.  Hypertension Coreg.  10.  Hyperlipidemia Statin.   12.  Anemia of chronic disease Likely secondary to cancer and chemotherapy.  Patient with no overt bleeding.  Status post upper endoscopy with grade D esophagitis.  On PPI.  Hemoglobin currently stable at 7.5.  Transfusion threshold hemoglobin < 7.  13.  Hypokalemia Replete.  14.  Diabetes mellitus type 2 Hemoglobin A1c 6.6 on 03/04/2020.  CBG 107 this morning.  Patient on clear liquids and has been advanced to full liquid diet.  Sliding scale insulin.   15 deconditioning PT evaluation pending.  16.  Hypokalemia Replete.     DVT prophylaxis: Lovenox.  Code Status: Full Family Communication: Updated patient.  No family at bedside. Disposition:   Status is: Inpatient    Dispo: The patient is from: Home              Anticipated d/c is to: Home versus SNF              Anticipated d/c date is: 1- 2 days.              Patient currently on IV antibiotics for postobstructive pneumonia, status post left upper lobe biopsy, on full liquid diet and diet being advanced.  Not stable for discharge today.       Consultants:   Interventional radiology: Dr. Laurence Ferrari 04/10/2020  Gastroenterology: Dr. Tarri Glenn 04/10/2020  Palliative care consultation: Dr. Domingo Cocking 04/09/2020  Procedures:   Upper endoscopy 04/11/2020 per Dr. Rush Landmark  Left upper lobe CT-guided biopsy per IR 04/12/2020  CT head without contrast 04/09/2020  CT angiogram chest 04/07/2020  Chest x-ray 04/07/2020, 04/12/2020  Antimicrobials:  IV cefepime 04/07/2020>>>>> 04/13/2020  IV vancomycin 04/07/2020>>>>>  04/08/2020  IV Rocephin 04/13/2020   Subjective: Patient sitting up in bed about to eat his full liquid breakfast.  Denies any chest pain.  Shortness of breath improving.  No bleeding.   Objective: Vitals:   04/12/20 1956 04/12/20 2000 04/13/20 0534 04/13/20 0746  BP: 100/76  114/75   Pulse: 83  76   Resp: 18  18   Temp: 97.7 F (36.5 C)  98.2 F (36.8 C)   TempSrc: Oral  Oral   SpO2: 99% 97% 100% 96%  Weight:      Height:        Intake/Output Summary (Last 24 hours) at 04/13/2020 1144 Last data filed at 04/13/2020 0533 Gross per 24 hour  Intake 360 ml  Output 400 ml  Net -40 ml   Filed Weights   04/11/20 0629 04/11/20 0832 04/11/20 2020  Weight: 88.8 kg 89.8 kg 89.7 kg    Examination:  General exam: NAD Respiratory system: Decreased breath sounds in the right base.  No wheezing.  Speaking in full sentences.  Normal respiratory effort. Cardiovascular system: Regular rate rhythm no murmurs rubs or gallops.  No JVD.  No lower extremity edema.  Gastrointestinal system: Abdomen is soft, nontender, nondistended, positive  bowel sounds.  No rebound.  No guarding.  Central nervous system: Alert and oriented. No focal neurological deficits. Extremities: Symmetric 5 x 5 power. Skin: No rashes, lesions or ulcers Psychiatry: Judgement and insight appear normal. Mood & affect appropriate.     Data Reviewed: I have personally reviewed following labs and imaging studies  CBC: Recent Labs  Lab 04/09/20 0649 04/10/20 0554 04/11/20 0525 04/12/20 0512 04/13/20 0555  WBC 2.2* 2.3* 2.2* 2.5* 2.6*  NEUTROABS 1.5* 1.6* 1.4* 1.6* 1.7  HGB 7.9* 7.8* 7.7* 7.5* 7.5*  HCT 26.0* 26.1* 26.0* 25.3* 25.0*  MCV 76.9* 77.0* 78.5* 78.8* 78.6*  PLT 190 203 203 215 269    Basic Metabolic Panel: Recent Labs  Lab 04/09/20 0649 04/10/20 0554 04/11/20 0525 04/12/20 0512 04/13/20 0555  NA 135 139 135 137 136  K 3.3* 3.7 3.5 3.3* 3.1*  CL 105 110 106 108 109  CO2 19* 19* 17* 17* 18*   GLUCOSE 115* 128* 101* 126* 110*  BUN 10 12 10 14 9   CREATININE 0.49* 0.66 0.59* 0.61 0.61  CALCIUM 7.8* 7.9* 7.8* 7.9* 7.8*  MG 2.0 2.1 1.9 2.1 1.9    GFR: Estimated Creatinine Clearance: 88 mL/min (by C-G formula based on SCr of 0.61 mg/dL).  Liver Function Tests: Recent Labs  Lab 04/07/20 2006 04/10/20 0554  AST 18 15  ALT 14 14  ALKPHOS 50 47  BILITOT 1.2 1.0  PROT 6.9 6.0*  ALBUMIN 2.7* 2.2*    CBG: Recent Labs  Lab 04/11/20 2224 04/12/20 0736 04/12/20 1311 04/12/20 1702 04/13/20 0735  GLUCAP 130* 114* 125* 145* 107*     Recent Results (from the past 240 hour(s))  Culture, blood (routine x 2)     Status: None (Preliminary result)   Collection Time: 04/07/20  8:06 PM   Specimen: BLOOD  Result Value Ref Range Status   Specimen Description   Final    BLOOD LEFT ANTECUBITAL Performed at [facility], [place] [address], [place], [place] [address]    Special Requests   Final    BOTTLES DRAWN AEROBIC AND ANAEROBIC Blood Culture results may not be optimal due to an excessive volume of blood received in culture bottles Performed at [place] [address], [place], [place] [address]    Culture   Final    NO GROWTH 4 DAYS Performed at [facility], [place] [address], [place], [place] [address]    Report Status PENDING  Incomplete  Culture, blood (routine x 2)     Status: None (Preliminary result)   Collection Time: 04/07/20  8:11 PM   Specimen: BLOOD  Result Value Ref Range Status   Specimen Description   Final    BLOOD RIGHT ANTECUBITAL Performed at [place] [address], [place], [place] [address]    Special Requests   Final    BOTTLES DRAWN AEROBIC AND ANAEROBIC Blood Culture results may not be optimal due to an excessive volume of blood received in culture bottles Performed at [place] [address] [place], [place] [address]    Culture   Final    NO GROWTH  4 DAYS Performed at [facility], [place] [address] [place],  [address]    Report Status PENDING  Incomplete  SARS Coronavirus 2 by RT PCR (hospital order, performed in [facility] hospital lab) Nasopharyngeal Nasopharyngeal Swab     Status: None   Collection Time: 04/07/20  8:15  PM   Specimen: Nasopharyngeal Swab  Result Value Ref Range Status   SARS Coronavirus 2 NEGATIVE NEGATIVE Final    Comment: (NOTE) SARS-CoV-2 target nucleic acids are NOT DETECTED.  The SARS-CoV-2 RNA is generally detectable in upper and lower respiratory specimens during the acute phase of infection. The lowest concentration of SARS-CoV-2 viral copies this assay can detect is 250 copies / mL. A negative result does not preclude SARS-CoV-2 infection and should not be used as the sole basis for treatment or other patient management decisions.  A negative result may occur with improper specimen collection / handling, submission of specimen other than nasopharyngeal swab, presence of viral mutation(s) within the areas targeted by this assay, and inadequate number of viral copies (<250 copies / mL). A negative result must be combined with clinical observations, patient history, and epidemiological information.  Fact Sheet for Patients:   StrictlyIdeas.no  Fact Sheet for Healthcare Providers: BankingDealers.co.za  This test is not yet approved or  cleared by the [place] FDA and has been authorized for detection and/or diagnosis of SARS-CoV-2 by FDA under an Emergency Use Authorization (EUA).  This EUA will remain in effect (meaning this test can be used) for the duration of the COVID-19 declaration under Section 564(b)(1) of the Act, 21 U.S.C. section 360bbb-3(b)(1), unless the authorization is terminated or revoked sooner.  Performed at [facility], [place] [address] [place], [place] [address]          Radiology  Studies: CT BIOPSY  Result Date: 2020-04-28 INDICATION: 79 year old male with a history of right-sided lung cancer and a new hypermetabolic pleural based nodule in the periphery of the left lung apex. It is unclear if this represents a metastasis or a second primary lung malignancy. He presents for CT-guided biopsy to facilitate tissue diagnosis. EXAM: CT-guided biopsy left upper lobe pulmonary nodule Interventional Radiologist:  Criselda Peaches, MD MEDICATIONS: None. ANESTHESIA/SEDATION: Fentanyl 100 mcg IV; Versed 2 mg IV Moderate Sedation Time:  10 minutes The patient was continuously monitored during the procedure by the interventional radiology nurse under my direct supervision. FLUOROSCOPY TIME:  None. COMPLICATIONS: None immediate. Estimated blood loss:  0 PROCEDURE: Informed written consent was obtained from the patient after a thorough discussion of the procedural risks, benefits and alternatives. All questions were addressed. Maximal Sterile Barrier Technique was utilized including caps, mask, sterile gowns, sterile gloves, sterile drape, hand hygiene and skin antiseptic. A timeout was performed prior to the initiation of the procedure. A planning axial CT scan was performed. The nodule in the left upper lobe was successfully identified. A suitable skin entry site was selected and marked. The region was then sterilely prepped and draped in standard fashion using Betadine skin prep. Local anesthesia was attained by infiltration with 1% lidocaine. A small dermatotomy was made. Under intermittent CT fluoroscopic guidance, a 17 gauge trocar needle was advanced into the lung and positioned at the margin of the nodule. Multiple 18 gauge core biopsies were then coaxially obtained using the BioPince automated biopsy device. Biopsy specimens were placed in formalin and delivered to pathology for further analysis. The biopsy device and introducer needle were removed. Post biopsy axial CT imaging demonstrates  no evidence of immediate complication. There is no pneumothorax. Mild perilesional alveolar hemorrhage is not unexpected. The patient tolerated the procedure well. IMPRESSION: Technically successful CT-guided biopsy left upper lobe pulmonary nodule. Electronically Signed   By: Jacqulynn Cadet M.D.   On: Apr 28, 2020 14:06   DG Chest Port 1  View  Result Date: 04/12/2020 CLINICAL DATA:  Status post lung biopsy. EXAM: PORTABLE CHEST 1 VIEW COMPARISON:  April 07, 2020. FINDINGS: Heart size is within normal limits. Stable appearance of right infrahilar mass. No pneumothorax or pleural effusion is noted. The visualized skeletal structures are unremarkable. IMPRESSION: Stable appearance of right infrahilar mass. No other abnormality seen in the chest. Electronically Signed   By: Marijo Conception M.D.   On: 04/12/2020 13:54        Scheduled Meds: . albuterol  2.5 mg Nebulization BID  . aspirin EC  81 mg Oral Daily  . atorvastatin  80 mg Oral Daily  . budesonide (PULMICORT) nebulizer solution  0.25 mg Nebulization BID  . carvedilol  6.25 mg Oral BID WC  . docusate sodium  200 mg Oral BID  . enoxaparin (LOVENOX) injection  40 mg Subcutaneous Q24H  . feeding supplement (ENSURE ENLIVE)  237 mL Oral BID BM  . folic acid  1 mg Oral Daily  . influenza vaccine adjuvanted  0.5 mL Intramuscular Tomorrow-1000  . insulin aspart  0-9 Units Subcutaneous TID WC  . multivitamin with minerals  1 tablet Oral Daily  . pantoprazole (PROTONIX) IV  40 mg Intravenous Q12H  . potassium chloride  40 mEq Oral Q4H  . sucralfate  1 g Oral TID WC & HS   Continuous Infusions: . cefTRIAXone (ROCEPHIN)  IV       LOS: 5 days    Time spent: 35 minutes    Irine Seal, MD [facility]   To contact the attending provider between 7A-7P or the covering provider during after hours 7P-7A, please log into the web site [email or web address] and access using universal [place] password for that web site. If you do  not have the password, please call the hospital operator.  04/13/2020, 11:44 AM

## 2020-04-13 NOTE — Progress Notes (Signed)
Daily Progress Note   Patient Name: Scott Conley       Date: 04/13/2020 DOB: 07/05/1941  Age: 79 y.o. MRN#: [id number] Attending Physician: Eugenie Filler, MD Primary Care Physician: Golden Circle, FNP Admit Date: 04/07/2020  Reason for Consultation/Follow-up: Establishing goals of care  Subjective:  awake alert Has cough at times Denies shortness of breath States that he had several procedures done yesterday.   Length of Stay: 5  Current Medications: Scheduled Meds:  . albuterol  2.5 mg Nebulization BID  . aspirin EC  81 mg Oral Daily  . atorvastatin  80 mg Oral Daily  . budesonide (PULMICORT) nebulizer solution  0.25 mg Nebulization BID  . carvedilol  6.25 mg Oral BID WC  . docusate sodium  200 mg Oral BID  . enoxaparin (LOVENOX) injection  40 mg Subcutaneous Q24H  . feeding supplement (ENSURE ENLIVE)  237 mL Oral BID BM  . folic acid  1 mg Oral Daily  . influenza vaccine adjuvanted  0.5 mL Intramuscular Tomorrow-1000  . insulin aspart  0-9 Units Subcutaneous TID WC  . multivitamin with minerals  1 tablet Oral Daily  . pantoprazole (PROTONIX) IV  40 mg Intravenous Q12H  . sucralfate  1 g Oral TID WC & HS    Continuous Infusions: . cefTRIAXone (ROCEPHIN)  IV      PRN Meds: acetaminophen **OR** acetaminophen, albuterol, ketorolac, ondansetron **OR** ondansetron (ZOFRAN) IV, traMADol  Physical Exam         Awake alert Appears with generalized weakness No distress Coarse breath sounds S 1 S 2    Abdomen not tender Has some edema Non focal     Vital Signs: BP 114/75   Pulse 76   Temp 98.2 F (36.8 C) (Oral)   Resp 18   Ht 6\' 3"  (1.905 m)   Wt 89.7 kg   SpO2 96%   BMI 24.72 kg/m  SpO2: SpO2: 96 % O2 Device: O2 Device: Room Air O2 Flow Rate: O2  Flow Rate (L/min): 2 L/min  Intake/output summary:   Intake/Output Summary (Last 24 hours) at 04/13/2020 1353 Last data filed at 04/13/2020 1200 Gross per 24 hour  Intake 1285 ml  Output 400 ml  Net 885 ml   LBM: Last BM Date: 04/11/20 Baseline Weight: Weight: 88.9 kg Most recent weight:  Weight: 89.7 kg       Palliative Assessment/Data:    Flowsheet Rows     Most Recent Value  Intake Tab  Referral Department Hospitalist  Unit at Time of Referral Oncology Unit  Palliative Care Primary Diagnosis Cancer  Date Notified 04/09/20  Palliative Care Type New Palliative care  Reason for referral Clarify Goals of Care  Date of Admission 04/07/20  Date first seen by Palliative Care 04/09/20  # of days Palliative referral response time 0 Day(s)  # of days IP prior to Palliative referral 2  Clinical Assessment  Palliative Performance Scale Score 50%  Psychosocial & Spiritual Assessment  Palliative Care Outcomes  Patient/Family meeting held? Yes  Who was at the meeting? Patient  Palliative Care Outcomes Clarified goals of care      Patient Active Problem List   Diagnosis Date Noted  . Abnormal PET scan of colon   . Erosive esophagitis   . Esophageal dysphagia   . Nodule of left lung   . Controlled type 2 diabetes mellitus with hyperglycemia ([place]) 04/08/2020  . Postobstructive pneumonia 04/07/2020  . Goals of care, counseling/discussion 03/20/2020  . Encounter for antineoplastic chemotherapy 03/20/2020  . Malignant neoplasm of bronchus of right lower lobe ([place])   . Hilar mass   . HLD (hyperlipidemia)   . Hypokalemia   . Hyponatremia   . Microcytic anemia   . Medicare annual wellness visit, subsequent 11/21/2016  . Obesity 11/21/2016  . Diabetes ([place]) 11/21/2015  . Hypertension 11/21/2015  . Low back strain 11/21/2015  . Tobacco use 03/01/2014  . Ataxia, late effect of cerebrovascular disease 01/18/2014  . CVA (cerebral infarction) 11/30/2013  . Stroke [facility]) 11/26/2013     Palliative Care Assessment & Plan   Patient Profile:  79 y.o.malewithhistory of recently diagnosed non-small cell lung cancer on chemo and radiation presented to the ER with complaint of shortness of breath. On presentation, he was tachypneic and tachycardic. CT angiogram of the chest was negative for pulmonary embolism but did show features concerning for postobstructive pneumonia and was started on antibiotics. COVID-19 test was negative.Subsequently, GI was consulted for dysphagia. Palliative care was also consulted for goals of care discussion  Assessment:  chaplain consult for HCPOA pending on 9-15 CT guided biopsy of LUL pleural based pulmonary nodule, biopsy results pending.  Post obstructive PNA Non small cell lung cancer Dysphagia severe esophagitis  Recommendations/Plan: Re discussed goals of care, patient endorses full code full scope, he will address advanced directives packet when he can.  Continue current mode of care for now.   Goals of Care and Additional Recommendations:  Limitations on Scope of Treatment: Full Scope Treatment  Code Status:    Code Status Orders  (From admission, onward)         Start     Ordered   04/08/20 0304  Full code  Continuous        04/08/20 0304        Code Status History    Date Active Date Inactive Code Status Order ID Comments User Context   03/04/2020 1515 03/05/2020 2158 Full Code [id number]  Mckinley Jewel, MD ED   11/30/2013 1414 12/10/2013 1505 Full Code [id number]  Elizabeth Sauer Inpatient   11/26/2013 2233 11/30/2013 1414 Full Code [id number]  [name], MD Inpatient   Advance Care Planning Activity       Prognosis:   Unable to determine  Discharge Planning:  To Be Determined  Care plan was  discussed with  Patient.       He states that he has a son and daughter who live locally in [place], [place], he states that he is constantly in touch with him, doesn't want me to contact them.   Thank you  for allowing the Palliative Medicine Team to assist in the care of this patient.   Time In: 1400 Time Out: 1425 Total Time 25 Prolonged Time Billed No       Greater than 50%  of this time was spent counseling and coordinating care related to the above assessment and plan.  Loistine Chance, MD  Please contact Palliative Medicine Team phone at [phone] for questions and concerns.

## 2020-04-13 NOTE — Progress Notes (Signed)
SLP Cancellation Note  Patient Details Name: Scott Conley MRN: [id number] DOB: 1940/08/22   Cancelled treatment:       Reason Eval/Treat Not Completed: Patient at procedure or test/unavailable (pt at radiation at this time, will continue efforts)   Macario Golds 04/13/2020, 4:46 PM  Kathleen Lime, MS [facility] SLP [place] Office [phone]

## 2020-04-13 NOTE — Progress Notes (Signed)
Patient returned from radiation in stable condition. Comfortable in bed, denies pain,  Call bell within reach, side rails up x 2, wheels locked, bed in lowest position. physical therapy to see the patient after lunch today.

## 2020-04-13 NOTE — Progress Notes (Signed)
PT Cancellation Note  Patient Details Name: Scott Conley MRN: [id number] DOB: 1941-02-21   Cancelled Treatment:     PT eval re-attempted but pt adamantly refusing, "I just can't do that; that's too much for me right now; No, I can't even sit up in the chair, that would just be too much".   Will follow.   Faylynn Stamos 04/13/2020, 4:49 PM

## 2020-04-13 NOTE — Telephone Encounter (Signed)
Scheduled per 09/01 los, patient has been called and voicemail was left.

## 2020-04-14 ENCOUNTER — Encounter: Payer: Self-pay | Admitting: Gastroenterology

## 2020-04-14 ENCOUNTER — Ambulatory Visit
Admission: RE | Admit: 2020-04-14 | Discharge: 2020-04-14 | Disposition: A | Discharge status: Home / Self Care | Payer: Medicare HMO | Source: Ambulatory Visit | Attending: Radiation Oncology | Admitting: Radiation Oncology

## 2020-04-14 DIAGNOSIS — R531 Weakness: Secondary | ICD-10-CM

## 2020-04-14 DIAGNOSIS — C3431 Malignant neoplasm of lower lobe, right bronchus or lung: Secondary | ICD-10-CM | POA: Diagnosis not present

## 2020-04-14 LAB — BASIC METABOLIC PANEL
Anion gap: 10 (ref 5–15)
BUN: 10 mg/dL (ref 8–23)
CO2: 17 mmol/L — ABNORMAL LOW (ref 22–32)
Calcium: 7.7 mg/dL — ABNORMAL LOW (ref 8.9–10.3)
Chloride: 109 mmol/L (ref 98–111)
Creatinine, Ser: 0.64 mg/dL (ref 0.61–1.24)
GFR calc Af Amer: >60 mL/min (ref >60)
GFR calc non Af Amer: >60 mL/min (ref >60)
Glucose, Bld: 105 mg/dL — ABNORMAL HIGH (ref 70–99)
Potassium: 3.4 mmol/L — ABNORMAL LOW (ref 3.5–5.1)
Sodium: 136 mmol/L (ref 135–145)

## 2020-04-14 LAB — CBC
HCT: 25.8 % — ABNORMAL LOW (ref 39.0–52.0)
Hemoglobin: 7.5 g/dL — ABNORMAL LOW (ref 13.0–17.0)
MCH: 23.4 pg — ABNORMAL LOW (ref 26.0–34.0)
MCHC: 29.1 g/dL — ABNORMAL LOW (ref 30.0–36.0)
MCV: 80.6 fL (ref 80.0–100.0)
Platelets: 219 10*3/uL (ref 150–400)
RBC: 3.2 MIL/uL — ABNORMAL LOW (ref 4.22–5.81)
RDW: 22.6 % — ABNORMAL HIGH (ref 11.5–15.5)
WBC: 2.9 10*3/uL — ABNORMAL LOW (ref 4.0–10.5)
nRBC: 0.7 % — ABNORMAL HIGH (ref 0.0–0.2)

## 2020-04-14 LAB — SURGICAL PATHOLOGY

## 2020-04-14 LAB — GLUCOSE, CAPILLARY
Glucose-Capillary: 109 mg/dL — ABNORMAL HIGH (ref 70–99)
Glucose-Capillary: 107 mg/dL — ABNORMAL HIGH (ref 70–99)
Glucose-Capillary: 166 mg/dL — ABNORMAL HIGH (ref 70–99)
Glucose-Capillary: 97 mg/dL (ref 70–99)

## 2020-04-14 MED ORDER — CEFDINIR 300 MG PO CAPS
300.0000 mg | ORAL_CAPSULE | Freq: Two times a day (BID) | ORAL | Status: DC
  Administered 2020-04-14 – 2020-04-15 (×3): 300 mg via ORAL
  Filled 2020-04-14 (×3): qty 1

## 2020-04-14 MED ORDER — PANTOPRAZOLE SODIUM 40 MG PO TBEC
40.0000 mg | DELAYED_RELEASE_TABLET | Freq: Two times a day (BID) | ORAL | Status: DC
  Administered 2020-04-14 – 2020-04-15 (×3): 40 mg via ORAL
  Filled 2020-04-14 (×3): qty 1

## 2020-04-14 MED FILL — Dexamethasone Sodium Phosphate Inj 100 MG/10ML: INTRAMUSCULAR | Qty: 2 | Status: AC

## 2020-04-14 NOTE — Progress Notes (Signed)
PROGRESS NOTE    Scott Conley  DGL:[id number] DOB: 16-Aug-1940 DOA: 04/07/2020 PCP: Golden Circle, FNP    Chief Complaint  Patient presents with  . Shortness of Breath    Brief Narrative:  79 y.o.malewithhistory of recently diagnosed non-small cell lung cancer on chemo and radiation presented to the ER with complaint of shortness of breath.  On presentation, he was tachypneic and tachycardic.  CT angiogram of the chest was negative for pulmonary embolism but did show features concerning for postobstructive pneumonia and was started on antibiotics.  COVID-19 test was negative.  Subsequently, GI was consulted for dysphagia.  Palliative care was also consulted for goals of care discussion.   Assessment & Plan:   Principal Problem:   Postobstructive pneumonia Active Problems:   Hypertension   Malignant neoplasm of bronchus of right lower lobe (HCC)   Controlled type 2 diabetes mellitus with hyperglycemia (HCC)   Abnormal PET scan of colon   Erosive esophagitis   Esophageal dysphagia   Nodule of left lung   Dyspnea   History of lung biopsy  1 postobstructive pneumonia Patient with history of lung cancer, had presented with shortness of breath and on presentation noted to be tachypneic, tachycardic.  CT angiogram chest done was negative for PE however concerning for postobstructive pneumonia and patient placed empirically on IV antibiotics.  COVID-19 PCR negative.  Clinical improvement.  Patient initially was empirically on IV vancomycin, IV cefepime.  IV Vanco has been discontinued.  Blood cultures with no growth to date.  Hypoxia improving with sats of 96-99% on room air.  IV cefepime was narrowed to IV Rocephin.  We will transition from IV Rocephin to oral Omnicef to complete a 10-day course of antibiotic treatment.  Continue scheduled nebs, Pulmicort, supportive care.    2.  Non-small cell lung cancer/left upper lobe nodule Patient noted to have been recently diagnosed with  non-small cell lung cancer on chemotherapy and radiation treatment.  Being followed by oncology in the outpatient setting.  Patient currently full code.  Per Dr. Starla Link patient thinks he might not be able to tolerate next round of chemo/radiation.  This information was communicated with patient's oncologist Dr. Lorna Few via secure chat will follow up with patient in the outpatient setting.  Patient being followed by IR and patient status post left upper lobe biopsy 04/12/2020.  Outpatient follow-up with oncology.   3.  Dysphagia likely secondary to the severe esophagitis Patient with complaints of dysphagia early on during the hospitalization.  Patient noted to have had a PET scan done in the outpatient setting which showed an abnormal esophagus.  Patient underwent upper endoscopy 04/11/2020 that showed grade D esophagitis.  Patient status post left upper lobe biopsy.  Patient tolerating full liquid diet and was advanced to a soft diet.  Patient stated has some difficulty with a soft diet but willing to try again today. Patient being followed by GI who are recommending continuation of twice daily PPI and transition to oral PPI. Continue Carafate.  Will need follow-up EGD in approximately 3 months to ensure healing of esophagitis.  Appreciate GI input and recommendations.  4.  Submucosal gastric papule We will need outpatient follow-up and evaluation by GI.  5.  Gastritis/ulcerative esophagitis/reactive gastropathy/peptic duodenitis Noted on upper endoscopy.  Biopsies taken which showed peptic duodenitis, mild chronic gastritis, reactive gastropathy, Warthin-Starry stain negative for H. pylori, ulcerative esophagitis, HSV CMV stains are negative, PAS/F stain is negative.  Currently on a PPI which we will  continue for now.  Transition from IV PPI to oral PPI.  Follow.  6.  Abnormal right colon area of the IVC on PET scan Noncontrast CT imaging for attenuation raise concern of soft tissue lesion.   Patient needing colonoscopy but stated to GI unable to tolerate prep at this time.  Will need outpatient follow-up.  GI was following but have signed off.   7.  Confusion It was noted per Dr. Starla Link that patient daughter had reported more confusion lately as per nursing staff.  Head CT done negative for any acute abnormality however showed chronic strokes.  Mental status improvement.  Vitamin B12, TSH within normal limits.  Supportive care.   8.  Folate deficiency Folate level at 1.9.  Folic acid daily.  Outpatient follow-up.   9.  Hypertension Continue Coreg.   10.  Hyperlipidemia Continue statin.    12.  Anemia of chronic disease Likely secondary to cancer and chemotherapy.  Patient with no overt bleeding.  Status post upper endoscopy with grade D esophagitis.  On PPI.  Hemoglobin currently stable at 7.5.  Transfusion threshold hemoglobin < 7.  13.  Hypokalemia Replete.  14.  Diabetes mellitus type 2 Hemoglobin A1c 6.6 on 03/04/2020.  CBG 109 this morning.  Patient tolerating full liquid diet.  Has been advanced to a soft diet.  Patient stated having some difficulty with diet.  Sliding scale insulin.   15 deconditioning PT evaluation pending.     DVT prophylaxis: Lovenox.  Code Status: Full Family Communication: Updated patient.  No family at bedside. Disposition:   Status is: Inpatient    Dispo: The patient is from: Home              Anticipated d/c is to: Home with home health versus SNF              Anticipated d/c date is: 1- 2 days.              Patient currently on being transitioned from IV antibiotics to oral antibiotics for postobstructive pneumonia, status post left upper lobe biopsy, being transitioned to a soft diet, awaiting evaluation by PT.  Not stable for discharge today.        Consultants:   Interventional radiology: Dr. Laurence Ferrari 04/10/2020  Gastroenterology: Dr. Tarri Glenn 04/10/2020  Palliative care consultation: Dr. Domingo Cocking  04/09/2020  Procedures:   Upper endoscopy 04/11/2020 per Dr. Rush Landmark  Left upper lobe CT-guided biopsy per IR 04/12/2020  CT head without contrast 04/09/2020  CT angiogram chest 04/07/2020  Chest x-ray 04/07/2020, 04/12/2020  Antimicrobials:  IV cefepime 04/07/2020>>>>> 04/13/2020  IV vancomycin 04/07/2020>>>>> 04/08/2020  IV Rocephin 04/13/2020>>>>> 04/14/2020  Omnicef 04/14/2020>>>> 04/18/2020   Subjective: Sitting up in bed.  Denies chest pain or shortness of breath.  Denies any bleeding.  Stated had some difficulty eating yesterday.  Tolerated full liquids.  Currently on a soft diet.  Patient stated just returned from radiation treatment.  Follow.   Objective: Vitals:   04/13/20 2034 04/13/20 2050 04/14/20 0755 04/14/20 0841  BP:  139/80  127/86  Pulse:  86  85  Resp:  18    Temp:  97.9 F (36.6 C)    TempSrc:  Oral    SpO2: 97% 97% 99%   Weight:      Height:        Intake/Output Summary (Last 24 hours) at 04/14/2020 1232 Last data filed at 04/13/2020 1747 Gross per 24 hour  Intake 120 ml  Output 200 ml  Net -  80 ml   Filed Weights   04/11/20 0629 04/11/20 0832 04/11/20 2020  Weight: 88.8 kg 89.8 kg 89.7 kg    Examination:  General exam: NAD Respiratory system: Decreased breath sounds in the right base otherwise clear.  No wheezing.  Speaking in full sentences.  Normal respiratory effort.  Cardiovascular system: RRR no murmurs rubs or gallops.  No JVD.  No lower extremity edema.  Gastrointestinal system: Abdomen is soft, nontender, nondistended, positive bowel sounds.  No rebound.  No guarding.   Central nervous system: Alert and oriented. No focal neurological deficits. Extremities: Symmetric 5 x 5 power. Skin: No rashes, lesions or ulcers Psychiatry: Judgement and insight appear normal. Mood & affect appropriate.     Data Reviewed: I have personally reviewed following labs and imaging studies  CBC: Recent Labs  Lab 04/09/20 0649 04/09/20 0649  04/10/20 0554 04/11/20 0525 04/12/20 0512 04/13/20 0555 04/14/20 0540  WBC 2.2*   < > 2.3* 2.2* 2.5* 2.6* 2.9*  NEUTROABS 1.5*  --  1.6* 1.4* 1.6* 1.7  --   HGB 7.9*   < > 7.8* 7.7* 7.5* 7.5* 7.5*  HCT 26.0*   < > 26.1* 26.0* 25.3* 25.0* 25.8*  MCV 76.9*   < > 77.0* 78.5* 78.8* 78.6* 80.6  PLT 190   < > 203 203 215 196 219   < > = values in this interval not displayed.    Basic Metabolic Panel: Recent Labs  Lab 04/09/20 0649 04/09/20 0649 04/10/20 0554 04/11/20 0525 04/12/20 0512 04/13/20 0555 04/14/20 0540  NA 135   < > 139 135 137 136 136  K 3.3*   < > 3.7 3.5 3.3* 3.1* 3.4*  CL 105   < > 110 106 108 109 109  CO2 19*   < > 19* 17* 17* 18* 17*  GLUCOSE 115*   < > 128* 101* 126* 110* 105*  BUN 10   < > 12 10 14 9 10   CREATININE 0.49*   < > 0.66 0.59* 0.61 0.61 0.64  CALCIUM 7.8*   < > 7.9* 7.8* 7.9* 7.8* 7.7*  MG 2.0  --  2.1 1.9 2.1 1.9  --    < > = values in this interval not displayed.    GFR: Estimated Creatinine Clearance: 88 mL/min (by C-G formula based on SCr of 0.64 mg/dL).  Liver Function Tests: Recent Labs  Lab 04/07/20 2006 04/10/20 0554  AST 18 15  ALT 14 14  ALKPHOS 50 47  BILITOT 1.2 1.0  PROT 6.9 6.0*  ALBUMIN 2.7* 2.2*    CBG: Recent Labs  Lab 04/13/20 1215 04/13/20 1709 04/13/20 2051 04/14/20 0748 04/14/20 1137  GLUCAP 144* 124* 121* 109* 107*     Recent Results (from the past 240 hour(s))  Culture, blood (routine x 2)     Status: None   Collection Time: 04/07/20  8:06 PM   Specimen: BLOOD  Result Value Ref Range Status   Specimen Description   Final    BLOOD LEFT ANTECUBITAL Performed at [facility] [address], [place], [place] [address]    Special Requests   Final    BOTTLES DRAWN AEROBIC AND ANAEROBIC Blood Culture results may not be optimal due to an excessive volume of blood received in culture bottles Performed at [facility] [address], [place], [place] [address]     Culture   Final    NO GROWTH 5 DAYS Performed at [facility], [address] [place], [place] [address]    Report Status 04/13/2020 FINAL  Final  Culture, blood (routine x 2)     Status: None   Collection Time: 04/07/20  8:11 PM   Specimen: BLOOD  Result Value Ref Range Status   Specimen Description   Final    BLOOD RIGHT ANTECUBITAL Performed at [facility] [address], [place], [place] [address]    Special Requests   Final    BOTTLES DRAWN AEROBIC AND ANAEROBIC Blood Culture results may not be optimal due to an excessive volume of blood received in culture bottles Performed at [facility] [address], [place], [place] [address]    Culture   Final    NO GROWTH 5 DAYS Performed at [facility], [place] [address], [place], [place] [address]    Report Status 04/13/2020 FINAL  Final  SARS Coronavirus 2 by RT PCR (hospital order, performed in [facility] hospital lab) Nasopharyngeal Nasopharyngeal Swab     Status: None   Collection Time: 04/07/20  8:15 PM   Specimen: Nasopharyngeal Swab  Result Value Ref Range Status   SARS Coronavirus 2 NEGATIVE NEGATIVE Final    Comment: (NOTE) SARS-CoV-2 target nucleic acids are NOT DETECTED.  The SARS-CoV-2 RNA is generally detectable in upper and lower respiratory specimens during the acute phase of infection. The lowest concentration of SARS-CoV-2 viral copies this assay can detect is 250 copies / mL. A negative result does not preclude SARS-CoV-2 infection and should not be used as the sole basis for treatment or other patient management decisions.  A negative result may occur with improper specimen collection / handling, submission of specimen other than nasopharyngeal swab, presence of viral mutation(s) within the areas targeted by this assay, and inadequate number of viral copies (<250 copies / mL). A negative result must be combined with clinical observations, patient history,  and epidemiological information.  Fact Sheet for Patients:   StrictlyIdeas.no  Fact Sheet for Healthcare Providers: BankingDealers.co.za  This test is not yet approved or  cleared by the [place] FDA and has been authorized for detection and/or diagnosis of SARS-CoV-2 by FDA under an Emergency Use Authorization (EUA).  This EUA will remain in effect (meaning this test can be used) for the duration of the COVID-19 declaration under Section 564(b)(1) of the Act, 21 U.S.C. section 360bbb-3(b)(1), unless the authorization is terminated or revoked sooner.  Performed at [facility], [place] [address], [place], [place] [address]          Radiology Studies: CT BIOPSY  Result Date: 04/18/20 INDICATION: 79 year old male with a history of right-sided lung cancer and a new hypermetabolic pleural based nodule in the periphery of the left lung apex. It is unclear if this represents a metastasis or a second primary lung malignancy. He presents for CT-guided biopsy to facilitate tissue diagnosis. EXAM: CT-guided biopsy left upper lobe pulmonary nodule Interventional Radiologist:  Criselda Peaches, MD MEDICATIONS: None. ANESTHESIA/SEDATION: Fentanyl 100 mcg IV; Versed 2 mg IV Moderate Sedation Time:  10 minutes The patient was continuously monitored during the procedure by the interventional radiology nurse under my direct supervision. FLUOROSCOPY TIME:  None. COMPLICATIONS: None immediate. Estimated blood loss:  0 PROCEDURE: Informed written consent was obtained from the patient after a thorough discussion of the procedural risks, benefits and alternatives. All questions were addressed. Maximal Sterile Barrier Technique was utilized including caps, mask, sterile gowns, sterile gloves, sterile drape, hand hygiene and skin  antiseptic. A timeout was performed prior to the initiation of the procedure. A planning axial CT scan was  performed. The nodule in the left upper lobe was successfully identified. A suitable skin entry site was selected and marked. The region was then sterilely prepped and draped in standard fashion using Betadine skin prep. Local anesthesia was attained by infiltration with 1% lidocaine. A small dermatotomy was made. Under intermittent CT fluoroscopic guidance, a 17 gauge trocar needle was advanced into the lung and positioned at the margin of the nodule. Multiple 18 gauge core biopsies were then coaxially obtained using the BioPince automated biopsy device. Biopsy specimens were placed in formalin and delivered to pathology for further analysis. The biopsy device and introducer needle were removed. Post biopsy axial CT imaging demonstrates no evidence of immediate complication. There is no pneumothorax. Mild perilesional alveolar hemorrhage is not unexpected. The patient tolerated the procedure well. IMPRESSION: Technically successful CT-guided biopsy left upper lobe pulmonary nodule. Electronically Signed   By: Jacqulynn Cadet M.D.   On: 04/12/2020 14:06   DG Chest Port 1 View  Result Date: 04/12/2020 CLINICAL DATA:  Status post lung biopsy. EXAM: PORTABLE CHEST 1 VIEW COMPARISON:  April 07, 2020. FINDINGS: Heart size is within normal limits. Stable appearance of right infrahilar mass. No pneumothorax or pleural effusion is noted. The visualized skeletal structures are unremarkable. IMPRESSION: Stable appearance of right infrahilar mass. No other abnormality seen in the chest. Electronically Signed   By: Marijo Conception M.D.   On: 04/12/2020 13:54        Scheduled Meds: . albuterol  2.5 mg Nebulization BID  . aspirin EC  81 mg Oral Daily  . atorvastatin  80 mg Oral Daily  . budesonide (PULMICORT) nebulizer solution  0.25 mg Nebulization BID  . carvedilol  6.25 mg Oral BID WC  . cefdinir  300 mg Oral Q12H  . docusate sodium  200 mg Oral BID  . enoxaparin (LOVENOX) injection  40 mg Subcutaneous  Q24H  . feeding supplement (ENSURE ENLIVE)  237 mL Oral BID BM  . folic acid  1 mg Oral Daily  . influenza vaccine adjuvanted  0.5 mL Intramuscular Tomorrow-1000  . insulin aspart  0-9 Units Subcutaneous TID WC  . multivitamin with minerals  1 tablet Oral Daily  . pantoprazole  40 mg Oral BID  . sucralfate  1 g Oral TID WC & HS   Continuous Infusions:    LOS: 6 days    Time spent: 35 minutes    Irine Seal, MD [facility]   To contact the attending provider between 7A-7P or the covering provider during after hours 7P-7A, please log into the web site [email or web address] and access using universal [place] password for that web site. If you do not have the password, please call the hospital operator.  04/14/2020, 12:32 PM

## 2020-04-14 NOTE — Plan of Care (Signed)
Pt VS WNL this am.  No complaints at this time.  Discussed need for PT eval with pt and he states that he is willing to participate today.   Problem: Education: Goal: Knowledge of General Education information will improve Description: Including pain rating scale, medication(s)/side effects and non-pharmacologic comfort measures Outcome: Progressing   Problem: Health Behavior/Discharge Planning: Goal: Ability to manage health-related needs will improve Outcome: Progressing   Problem: Clinical Measurements: Goal: Ability to maintain clinical measurements within normal limits will improve Outcome: Progressing Goal: Will remain free from infection Outcome: Progressing Goal: Diagnostic test results will improve Outcome: Progressing Goal: Respiratory complications will improve Outcome: Progressing Goal: Cardiovascular complication will be avoided Outcome: Progressing   Problem: Nutrition: Goal: Adequate nutrition will be maintained Outcome: Progressing   Problem: Coping: Goal: Level of anxiety will decrease Outcome: Progressing   Problem: Elimination: Goal: Will not experience complications related to bowel motility Outcome: Progressing Goal: Will not experience complications related to urinary retention Outcome: Progressing   Problem: Pain Managment: Goal: General experience of comfort will improve Outcome: Progressing   Problem: Safety: Goal: Ability to remain free from injury will improve Outcome: Progressing   Problem: Skin Integrity: Goal: Risk for impaired skin integrity will decrease Outcome: Progressing   Problem: Activity: Goal: Risk for activity intolerance will decrease Outcome: Not Progressing

## 2020-04-14 NOTE — TOC Initial Note (Signed)
Transition of Care [facility]) - Initial/Assessment Note    Patient Details  Name: RAJON BISIG MRN: [id number] Date of Birth: [birth year]  Transition of [facility]) CM/SW Contact:    Jesus Poplin, Marjie Skiff, RN Phone Number: 04/14/2020, 3:31 PM  Clinical Narrative:                 Pt from home and has assistance from daughter most of the time. He said she lives with him but is not always there. Pt states that he is able to get to the bathroom on his own. Pt encouraged to get up more here in the hospital with physical therapy. Pt offered choice for home health services and [facility] chosen. [facility] liaison contacted for referral.   Expected Discharge Plan: [place] Barriers to Discharge: Continued Medical Work up   Expected Discharge Plan and Services Expected Discharge Plan: [place]   Discharge Planning Services: CM Consult    HH Arranged: RN, PT, OT [facility] Agency: [place] Date [facility] Agency Contacted: 04/14/20 Time HH Agency Contacted: 37 Representative spoke with at [place]: Tommi Rumps  Prior Living Arrangements/Services   Lives with:: Adult Children      Activities of Daily Living Home Assistive Devices/Equipment: Environmental consultant (specify type) ADL Screening (condition at time of admission) Patient's cognitive ability adequate to safely complete daily activities?: Yes Is the patient deaf or have difficulty hearing?: No Does the patient have difficulty seeing, even when wearing glasses/contacts?: No Does the patient have difficulty concentrating, remembering, or making decisions?: No Patient able to express need for assistance with ADLs?: No Does the patient have difficulty dressing or bathing?: Yes Independently performs ADLs?: Yes (appropriate for developmental age) Does the patient have difficulty walking or climbing stairs?: Yes (patient uses walker at home ) Weakness of Legs: Both Weakness of Arms/Hands: None  Permission Sought/Granted                   Emotional Assessment Appearance:: Appears stated age            Admission diagnosis:  Postobstructive pneumonia [J18.9] Dyspnea, unspecified type [R06.00] Patient Active Problem List   Diagnosis Date Noted  . Dyspnea   . History of lung biopsy   . Abnormal PET scan of colon   . Erosive esophagitis   . Esophageal dysphagia   . Nodule of left lung   . Controlled type 2 diabetes mellitus with hyperglycemia ([place]) 04/08/2020  . Postobstructive pneumonia 04/07/2020  . Goals of care, counseling/discussion 03/20/2020  . Encounter for antineoplastic chemotherapy 03/20/2020  . Malignant neoplasm of bronchus of right lower lobe ([place])   . Hilar mass   . HLD (hyperlipidemia)   . Hypokalemia   . Hyponatremia   . Microcytic anemia   . Medicare annual wellness visit, subsequent 11/21/2016  . Obesity 11/21/2016  . Diabetes ([place]) 11/21/2015  . Hypertension 11/21/2015  . Low back strain 11/21/2015  . Tobacco use 03/01/2014  . Ataxia, late effect of cerebrovascular disease 01/18/2014  . CVA (cerebral infarction) 11/30/2013  . Stroke ([place]) 11/26/2013   PCP:  Golden Circle, FNP Pharmacy:   [facility] ([place]), [place] - [address] [place] [address] Phone: [phone] Fax: [phone]     Social Determinants of Health (SDOH) Interventions    Readmission Risk Interventions No flowsheet data found.

## 2020-04-14 NOTE — Evaluation (Signed)
Physical Therapy Evaluation Patient Details Name: Scott Conley MRN: [id number] DOB: 1940/12/03 Today's Date: 04/14/2020   History of Present Illness  Patient is a 79 y/o male who presents with SOB, post-obstructive PNA and cough. CXR- new massive opacity right infrahilar lung likely secondary to bronchogenic carcinoma. s/p Tumor debulking right bronchus intermedius 8/8. PMH includes CVA with left residual weakness, HTN and DM.  Clinical Impression  Pt admitted as above and presenting with functional mobility limitations 2* generalized weakness, limited endurance, and ambulatory balance deficits.  Pt should progress to dc home with intermittent assist of family and could benefit from follow up HHPT to further address deficits dependent on acute stay progress and pt's willingness to accept.  This date, pt up to ambulate with distance ltd by pt stating "I don't walk much at home so this is far enough".     Follow Up Recommendations Home health PT    Equipment Recommendations  None recommended by PT    Recommendations for Other Services       Precautions / Restrictions Precautions Precautions: Fall Restrictions Weight Bearing Restrictions: No      Mobility  Bed Mobility Overal bed mobility: Modified Independent             General bed mobility comments: Pt to EOB unassisted but with increased time  Transfers Overall transfer level: Needs assistance Equipment used: Rolling walker (2 wheeled) Transfers: Sit to/from Stand Sit to Stand: Min guard         General transfer comment: elevated surface; steady assist only  Ambulation/Gait Ambulation/Gait assistance: Min assist Gait Distance (Feet): 34 Feet Assistive device: Rolling walker (2 wheeled) Gait Pattern/deviations: Step-through pattern;Decreased step length - right;Decreased step length - left;Shuffle;Trunk flexed Gait velocity: decr   General Gait Details: cues for posture and position from RW; distance ltd by pt  "I dont walk much at home"  Stairs            Wheelchair Mobility    Modified Rankin (Stroke Patients Only)       Balance Overall balance assessment: Needs assistance Sitting-balance support: No upper extremity supported;Feet supported Sitting balance-Leahy Scale: Good     Standing balance support: No upper extremity supported Standing balance-Leahy Scale: Fair                               Pertinent Vitals/Pain Pain Assessment: No/denies pain    Home Living Family/patient expects to be discharged to:: Private residence Living Arrangements: Alone Available Help at Discharge: Family;Available PRN/intermittently Type of Home: Apartment Home Access: Level entry     Home Layout: One level Home Equipment: Walker - 2 wheels      Prior Function Level of Independence: Independent with assistive device(s)         Comments: Uses RW for ambulation. No falls. Drives. Daughter brings food and gets groceries delivered.     Hand Dominance   Dominant Hand: Right    Extremity/Trunk Assessment   Upper Extremity Assessment Upper Extremity Assessment: Generalized weakness    Lower Extremity Assessment Lower Extremity Assessment: Generalized weakness    Cervical / Trunk Assessment Cervical / Trunk Assessment: Kyphotic  Communication   Communication: No difficulties  Cognition Arousal/Alertness: Awake/alert Behavior During Therapy: WFL for tasks assessed/performed Overall Cognitive Status: Within Functional Limits for tasks assessed  General Comments      Exercises     Assessment/Plan    PT Assessment Patient needs continued PT services  PT Problem List Decreased strength;Decreased activity tolerance;Decreased balance;Decreased mobility;Decreased knowledge of use of DME       PT Treatment Interventions DME instruction;Gait training;Functional mobility training;Therapeutic  activities;Therapeutic exercise;Patient/family education;Balance training    PT Goals (Current goals can be found in the Care Plan section)  Acute Rehab PT Goals Patient Stated Goal: HOME PT Goal Formulation: With patient Time For Goal Achievement: 04/28/20 Potential to Achieve Goals: Good    Frequency Min 3X/week   Barriers to discharge        Co-evaluation               AM-PAC PT "6 Clicks" Mobility  Outcome Measure Help needed turning from your back to your side while in a flat bed without using bedrails?: None Help needed moving from lying on your back to sitting on the side of a flat bed without using bedrails?: None Help needed moving to and from a bed to a chair (including a wheelchair)?: A Little Help needed standing up from a chair using your arms (e.g., wheelchair or bedside chair)?: A Little Help needed to walk in hospital room?: A Little Help needed climbing 3-5 steps with a railing? : A Little 6 Click Score: 20    End of Session Equipment Utilized During Treatment: Gait belt Activity Tolerance: Patient limited by fatigue Patient left: in chair;with call bell/phone within reach;with chair alarm set Nurse Communication: Mobility status PT Visit Diagnosis: Unsteadiness on feet (R26.81);Difficulty in walking, not elsewhere classified (R26.2)    Time: 0938-1829 PT Time Calculation (min) (ACUTE ONLY): 15 min   Charges:   PT Evaluation $PT Eval Low Complexity: 1 Low          [facility] Pager [phone] Office [phone]   Ran Tullis 04/14/2020, 5:19 PM

## 2020-04-15 DIAGNOSIS — R0600 Dyspnea, unspecified: Secondary | ICD-10-CM

## 2020-04-15 DIAGNOSIS — Z9889 Other specified postprocedural states: Secondary | ICD-10-CM

## 2020-04-15 LAB — CBC WITH DIFFERENTIAL/PLATELET
Abs Immature Granulocytes: 0.04 10*3/uL (ref 0.00–0.07)
Basophils Absolute: 0 10*3/uL (ref 0.0–0.1)
Basophils Relative: 2 %
Eosinophils Absolute: 0.1 10*3/uL (ref 0.0–0.5)
Eosinophils Relative: 2 %
HCT: 25.7 % — ABNORMAL LOW (ref 39.0–52.0)
Hemoglobin: 7.7 g/dL — ABNORMAL LOW (ref 13.0–17.0)
Immature Granulocytes: 2 %
Lymphocytes Relative: 21 %
Lymphs Abs: 0.5 10*3/uL — ABNORMAL LOW (ref 0.7–4.0)
MCH: 23.3 pg — ABNORMAL LOW (ref 26.0–34.0)
MCHC: 30 g/dL (ref 30.0–36.0)
MCV: 77.9 fL — ABNORMAL LOW (ref 80.0–100.0)
Monocytes Absolute: 0.4 10*3/uL (ref 0.1–1.0)
Monocytes Relative: 15 %
Neutro Abs: 1.5 10*3/uL — ABNORMAL LOW (ref 1.7–7.7)
Neutrophils Relative %: 58 %
Platelets: 223 10*3/uL (ref 150–400)
RBC: 3.3 MIL/uL — ABNORMAL LOW (ref 4.22–5.81)
RDW: 22.7 % — ABNORMAL HIGH (ref 11.5–15.5)
WBC: 2.6 10*3/uL — ABNORMAL LOW (ref 4.0–10.5)
nRBC: 0.8 % — ABNORMAL HIGH (ref 0.0–0.2)

## 2020-04-15 LAB — BASIC METABOLIC PANEL
Anion gap: 11 (ref 5–15)
BUN: 11 mg/dL (ref 8–23)
CO2: 19 mmol/L — ABNORMAL LOW (ref 22–32)
Calcium: 8 mg/dL — ABNORMAL LOW (ref 8.9–10.3)
Chloride: 108 mmol/L (ref 98–111)
Creatinine, Ser: 0.56 mg/dL — ABNORMAL LOW (ref 0.61–1.24)
GFR calc Af Amer: >60 mL/min (ref >60)
GFR calc non Af Amer: >60 mL/min (ref >60)
Glucose, Bld: 98 mg/dL (ref 70–99)
Potassium: 3 mmol/L — ABNORMAL LOW (ref 3.5–5.1)
Sodium: 138 mmol/L (ref 135–145)

## 2020-04-15 LAB — GLUCOSE, CAPILLARY
Glucose-Capillary: 94 mg/dL (ref 70–99)
Glucose-Capillary: 114 mg/dL — ABNORMAL HIGH (ref 70–99)

## 2020-04-15 LAB — MAGNESIUM: Magnesium: 2 mg/dL (ref 1.7–2.4)

## 2020-04-15 MED ORDER — TRAMADOL HCL 50 MG PO TABS
50.0000 mg | ORAL_TABLET | Freq: Four times a day (QID) | ORAL | 0 refills | Status: DC | PRN
Start: 2020-04-15 — End: 2020-04-18

## 2020-04-15 MED ORDER — CEFDINIR 300 MG PO CAPS: 300.0000 mg | ORAL_CAPSULE | Freq: Two times a day (BID) | ORAL | 0 refills | Status: AC

## 2020-04-15 MED ORDER — PANTOPRAZOLE SODIUM 40 MG PO TBEC: 40.0000 mg | DELAYED_RELEASE_TABLET | Freq: Two times a day (BID) | ORAL | 1 refills | Status: AC

## 2020-04-15 MED ORDER — SUCRALFATE 1 GM/10ML PO SUSP: 1.0000 g | Freq: Three times a day (TID) | ORAL | 0 refills | Status: AC

## 2020-04-15 MED ORDER — ARFORMOTEROL TARTRATE 15 MCG/2ML IN NEBU: 15.0000 ug | INHALATION_SOLUTION | Freq: Two times a day (BID) | RESPIRATORY_TRACT | 1 refills | Status: AC

## 2020-04-15 MED ORDER — BUDESONIDE 0.25 MG/2ML IN SUSP: 0.2500 mg | Freq: Two times a day (BID) | RESPIRATORY_TRACT | 0 refills | Status: AC

## 2020-04-15 MED ORDER — ENSURE ENLIVE PO LIQD: 237.0000 mL | Freq: Two times a day (BID) | ORAL | 12 refills | Status: AC

## 2020-04-15 MED ORDER — FOLIC ACID 1 MG PO TABS: 1.0000 mg | ORAL_TABLET | Freq: Every day | ORAL | Status: AC

## 2020-04-15 MED ORDER — ADULT MULTIVITAMIN W/MINERALS CH: 1.0000 | ORAL_TABLET | Freq: Every day | ORAL | Status: AC

## 2020-04-15 MED ORDER — POTASSIUM CHLORIDE 20 MEQ PO PACK
40.0000 meq | PACK | ORAL | Status: AC
  Administered 2020-04-15 (×2): 40 meq via ORAL
  Filled 2020-04-15 (×2): qty 2

## 2020-04-15 NOTE — Discharge Summary (Signed)
Physician Discharge Summary  Scott Conley SEG:[id number] DOB: July 09, 1941 DOA: 04/07/2020  PCP: Golden Circle, FNP  Admit date: 04/07/2020 Discharge date: 04/15/2020  Time spent: 55 minutes  Recommendations for Outpatient Follow-up:  1. Follow-up with Tye Savoy, NP gastroenterology on 04/25/2020 at 11:30 AM as scheduled. 2. Follow-up with Dr. Lorna Few, oncology in 1 week or as previously scheduled. 3. Patient was discharged with home health therapies. 4. Follow-up with Golden Circle, FNP in 2 weeks.  On follow-up patient will need a basic metabolic profile done to follow-up on electrolytes and renal function.   Discharge Diagnoses:  Principal Problem:   Postobstructive pneumonia Active Problems:   Hypertension   Malignant neoplasm of bronchus of right lower lobe (HCC)   Controlled type 2 diabetes mellitus with hyperglycemia (HCC)   Abnormal PET scan of colon   Erosive esophagitis   Esophageal dysphagia   Nodule of left lung   Dyspnea   History of lung biopsy   Discharge Condition: Stable and improved  Diet recommendation: Soft diet  Filed Weights   04/11/20 0629 04/11/20 0832 04/11/20 2020  Weight: 88.8 kg 89.8 kg 89.7 kg    History of present illness:  HPI per Dr. Noralee Chars is a 79 y.o. male with history of recently diagnosed non-small cell lung cancer on chemo and radiation presented to the ER with complaint of shortness of breath.  Patient stated he had been having increasing shortness of breath since last 24 hours.  He had been having nonproductive cough which worsened his shortness of breath.  Denied chest pain fever chills.  He did receive his chemotherapy 1 day prior to admission.  Following which he went home became more short of breath and presented to the ER.  ED Course: In the ER patient appearred tachypneic and tachycardic.  Labs were significant for hemoglobin of 9.6 and WBC of 3.1 which appears to be at baseline with  recent.  D-dimer was mildly elevated EKG was showing sinus tachycardia.  Patient underwent CT angiogram of the chest which was negative for pulmonary embolism but did show features concerning for postobstructive pneumonia and was started on antibiotics admitted for further observation.  Covid test was negative.  Hospital Course:  1 postobstructive pneumonia Patient with history of lung cancer, had presented with shortness of breath and on presentation noted to be tachypneic, tachycardic.  CT angiogram chest done was negative for PE however concerning for postobstructive pneumonia and patient placed empirically on IV antibiotics.  COVID-19 PCR negative.    Patient placed initially empirically on IV vancomycin IV cefepime.  IV vancomycin was subsequently discontinued.  Blood cultures were negative to date.  Hypoxia improved and patient was satting 96 to 99% on room air.  IV cefepime was subsequently narrowed to IV Rocephin which was subsequently narrowed/transition to oral Omnicef to complete a 10-day course of treatment.  Patient was discharged home with 3 more days of oral antibiotics to complete a course of antibiotic treatment.  Patient was also maintained on scheduled nebs, Pulmicort.  Outpatient follow-up.   2.  Non-small cell lung cancer/left upper lobe nodule Patient noted to have been recently diagnosed with non-small cell lung cancer on chemotherapy and radiation treatment.  Being followed by oncology in the outpatient setting.  Patient currently full code.  Per Dr. Starla Link patient thinks he might not be able to tolerate next round of chemo/radiation.  This information was communicated with patient's oncologist Dr. Lorna Few via secure chat will follow up  with patient in the outpatient setting.  Patient followed by IR and patient status post left upper lobe biopsy 04/12/2020 which was consistent with adenocarcinoma.  Outpatient follow-up with oncology.   3.  Dysphagia likely secondary to the  severe esophagitis Patient with complaints of dysphagia early on during the hospitalization.  Patient noted to have had a PET scan done in the outpatient setting which showed an abnormal esophagus.  Patient underwent upper endoscopy 04/11/2020 that showed grade D esophagitis.  Patient status post left upper lobe biopsy.  Patient was placed back on a full liquid diet which he tolerated and subsequently a soft diet.  Patient also maintained on a PPI. Patient being followed by GI who are recommending continuation of twice daily PPI and transition to oral PPI.  Patient transition to oral PPI maintained on Carafate. Will need follow-up EGD in approximately 3 months to ensure healing of esophagitis.  Outpatient follow-up with GI.  4.  Submucosal gastric papule We will need outpatient follow-up and evaluation by GI.  5.  Gastritis/ulcerative esophagitis/reactive gastropathy/peptic duodenitis Noted on upper endoscopy.  Biopsies taken which showed peptic duodenitis, mild chronic gastritis, reactive gastropathy, Warthin-Starry stain negative for H. pylori, ulcerative esophagitis, HSV CMV stains are negative, PAS/F stain is negative.    Patient maintained on a PPI.  Outpatient follow-up.    6.  Abnormal right colon area of the IVC on PET scan Noncontrast CT imaging for attenuation raise concern of soft tissue lesion.  Patient needing colonoscopy but stated to GI unable to tolerate prep at this time.  Will need outpatient follow-up.  GI was following but have signed off.  Outpatient follow-up with GI as previously scheduled.  7.  Confusion It was noted per Dr. Starla Link that patient daughter had reported more confusion lately as per nursing staff.  Head CT done negative for any acute abnormality however showed chronic strokes.  Mental status improved and was back to baseline by day of discharge.  Vitamin B12, TSH within normal limits.  Outpatient follow-up.  8.  Folate deficiency Folate level at 1.9.    Patient  placed on folic acid daily.  Outpatient follow-up.    9.  Hypertension Patient maintained on Coreg.   10.  Hyperlipidemia Patient maintained on statin.    12.  Anemia of chronic disease Likely secondary to cancer and chemotherapy.  Patient with no overt bleeding.  Status post upper endoscopy with grade D esophagitis.  On PPI.  Hemoglobin remained stable at 7.7 by day of discharge.  Outpatient follow-up.    13.  Hypokalemia Repleted.  Outpatient follow-up.  14.  Diabetes mellitus type 2 Hemoglobin A1c 6.6 on 03/04/2020.    Patient was maintained on sliding scale insulin.  Patient initially placed on a clear liquid diet post EGD diet slowly advanced to full liquid and subsequently a soft diet which he tolerated.  Patient also maintained on a PPI.  Outpatient follow-up.   15 deconditioning Patient seen by PT and home health recommended.     Procedures:  Upper endoscopy 04/11/2020 per Dr. Rush Landmark  Left upper lobe CT-guided biopsy per IR 04/12/2020  CT head without contrast 04/09/2020  CT angiogram chest 04/07/2020  Chest x-ray 04/07/2020, 04/12/2020  Consultations:  Interventional radiology: Dr. Laurence Ferrari 04/10/2020  Gastroenterology: Dr. Tarri Glenn 04/10/2020  Palliative care consultation: Dr. Domingo Cocking 04/09/2020  Discharge Exam: Vitals:   04/15/20 0739 04/15/20 1357  BP:  112/73  Pulse:  74  Resp:  15  Temp:  97.7 F (36.5 C)  SpO2:  97%     General: NAD Cardiovascular: RRR Respiratory: Decreased coarse breath sounds in the right base.  No wheezing.  No crackles.  Speaking in full sentences.  Discharge Instructions   Discharge Instructions    Diet general   Complete by: As directed    Soft Diet   Discharge wound care:   Complete by: As directed    As above   Increase activity slowly   Complete by: As directed      Allergies as of 04/15/2020   No Known Allergies     Medication List    TAKE these medications   arformoterol 15 MCG/2ML Nebu Commonly  known as: BROVANA Take 2 mLs (15 mcg total) by nebulization 2 (two) times daily.   aspirin EC 81 MG tablet Take 1 tablet (81 mg total) by mouth daily. Swallow whole.   atorvastatin 80 MG tablet Commonly known as: LIPITOR Take 1 tablet (80 mg total) by mouth daily. Must keep appt w/new provider for future refills   bisacodyl 10 MG suppository Commonly known as: Dulcolax Place 1 suppository (10 mg total) rectally daily as needed for severe constipation.   budesonide 0.25 MG/2ML nebulizer solution Commonly known as: PULMICORT Take 2 mLs (0.25 mg total) by nebulization 2 (two) times daily.   carvedilol 6.25 MG tablet Commonly known as: Coreg Take 1 tablet (6.25 mg total) by mouth 2 (two) times daily with a meal.   cefdinir 300 MG capsule Commonly known as: OMNICEF Take 1 capsule (300 mg total) by mouth every 12 (twelve) hours for 3 days.   docusate sodium 100 MG capsule Commonly known as: COLACE Take 200 mg by mouth 2 (two) times daily.   feeding supplement (ENSURE ENLIVE) Liqd Take 237 mLs by mouth 2 (two) times daily between meals. Start taking on: April 16, 1193   folic acid 1 MG tablet Commonly known as: FOLVITE Take 1 tablet (1 mg total) by mouth daily. Start taking on: April 16, 2020   multivitamin with minerals Tabs tablet Take 1 tablet by mouth daily. Start taking on: April 16, 2020   pantoprazole 40 MG tablet Commonly known as: PROTONIX Take 1 tablet (40 mg total) by mouth 2 (two) times daily.   polyethylene glycol 17 g packet Commonly known as: MiraLax Take 17 g by mouth daily as needed for moderate constipation.   prochlorperazine 10 MG tablet Commonly known as: COMPAZINE Take 1 tablet (10 mg total) by mouth every 6 (six) hours as needed for nausea or vomiting.   senna 8.6 MG Tabs tablet Commonly known as: SENOKOT Take 2 tablets (17.2 mg total) by mouth daily as needed for moderate constipation.   sitaGLIPtin 100 MG tablet Commonly known  as: Januvia Take 1 tablet (100 mg total) by mouth daily. Must keep appt w/new provider for future refills   sucralfate 1 GM/10ML suspension Commonly known as: CARAFATE Take 10 mLs (1 g total) by mouth 4 (four) times daily -  with meals and at bedtime.   traMADol 50 MG tablet Commonly known as: ULTRAM Take 1 tablet (50 mg total) by mouth every 6 (six) hours as needed for moderate pain.            Discharge Care Instructions  (From admission, onward)         Start     Ordered   04/15/20 0000  Discharge wound care:       Comments: As above   04/15/20 1437  No Known Allergies  Follow-up Information    [facility] Follow up.   Specialty: Home Health Services Contact information: [address] [place] [address] [phone]        Golden Circle, FNP. Schedule an appointment as soon as possible for a visit in 2 week(s).   Specialties: Family Medicine, Infectious Diseases Contact information: [place] [address] [phone]        Curt Bears, MD Follow up in 1 week(s).   Specialty: Oncology Why: f/u in 1 week or as scheduled. Contact information: [place] [address] [phone]        Willia Craze, NP Follow up on 04/25/2020.   Specialty: Gastroenterology Why: f/u as scheduled 1130am Contact information: [place] [address] [phone]                The results of significant diagnostics from this hospitalization (including imaging, microbiology, ancillary and laboratory) are listed below for reference.    Significant Diagnostic Studies: CT HEAD WO CONTRAST  Result Date: 04/09/2020 CLINICAL DATA:  Delirium.  Increased altered mental status. EXAM: CT HEAD WITHOUT CONTRAST TECHNIQUE: Contiguous axial images were obtained from the base of the skull through the vertex without intravenous contrast. COMPARISON:  Brain MRI 03/22/2020,  head CT 11/26/2013. FINDINGS: Brain: The examination is significantly motion degraded, limiting evaluation. Stable, moderate generalized parenchymal atrophy. Redemonstrated chronic cortically based infarcts within the right frontoparietal lobes. Stable, moderate ill-defined hypoattenuation within the cerebral white matter which is nonspecific, but consistent with chronic small vessel ischemic disease. No acute intracranial hemorrhage is identified. No acute demarcated cortical infarct is identified. No extra-axial fluid collection. No evidence of intracranial mass. No midline shift. Vascular: No hyperdense vessel.  Atherosclerotic calcifications Skull: Normal. Negative for fracture or focal lesion. Sinuses/Orbits: Visualized orbits show no acute finding. No significant paranasal sinus disease or mastoid effusion at the imaged levels. IMPRESSION: Significantly motion degraded examination, limiting evaluation. No acute intracranial abnormality is identified. Redemonstrated chronic cortically based infarcts within the right frontoparietal lobes. Stable background moderate generalized parenchymal atrophy and cerebral white matter chronic small vessel ischemic disease. Electronically Signed   By: Kellie Simmering DO   On: 04/09/2020 15:18   CT Angio Chest PE W and/or Wo Contrast  Result Date: 04/07/2020 CLINICAL DATA:  Positive D-dimer. Suspected pulmonary embolus with low to intermediate probability. Diagnosis of stage III lung cancer with radiation today. EXAM: CT ANGIOGRAPHY CHEST WITH CONTRAST TECHNIQUE: Multidetector CT imaging of the chest was performed using the standard protocol during bolus administration of intravenous contrast. Multiplanar CT image reconstructions and MIPs were obtained to evaluate the vascular anatomy. CONTRAST:  129mL OMNIPAQUE IOHEXOL 350 MG/ML SOLN COMPARISON:  03/04/2020 FINDINGS: Cardiovascular: There is good opacification of the central and segmental pulmonary arteries. No focal filling  defects. No evidence of significant pulmonary embolus. Normal heart size. No pericardial effusions. Normal caliber thoracic aorta. Scattered aortic and coronary artery calcifications. Mediastinum/Nodes: Right hilar lymphadenopathy and right hilar mass as seen on previous studies. The mass measures about 6.6 x 6.5 cm in diameter. Small esophageal hiatal hernia. Esophagus is decompressed. Lungs/Pleura: Emphysematous changes in the lungs. Scarring and pleural thickening in the left apex. Nodular scarring with spiculation in the left apex measures about 11 mm in diameter. Neoplasm is not excluded. Appearance is unchanged since prior study. Right hilar mass as previously indicated.Postobstructive pneumonia and atelectasis in the right lower lung medially and in the  right middle lung. Upper Abdomen: No acute process demonstrated in the visualized upper abdominal contents. Musculoskeletal: No destructive bone lesions. Review of the MIP images confirms the above findings. IMPRESSION: 1. No evidence of significant pulmonary embolus. 2. Right hilar lymphadenopathy and right hilar mass as seen on previous studies. Postobstructive pneumonia and atelectasis in the right lower lung medially and in the right middle lung. 3. Nodular scarring with spiculation in the left apex measures about 11 mm in diameter. Neoplasm is not excluded. 4. Emphysematous changes in the lungs. 5. Small esophageal hiatal hernia. 6. Emphysema and aortic atherosclerosis. Aortic Atherosclerosis (ICD10-I70.0) and Emphysema (ICD10-J43.9). Electronically Signed   By: Lucienne Capers M.D.   On: 04/07/2020 23:19   MR Brain W Wo Contrast  Result Date: 03/22/2020 CLINICAL DATA:  Staging non-small cell lung cancer. EXAM: MRI HEAD WITHOUT AND WITH CONTRAST TECHNIQUE: Multiplanar, multiecho pulse sequences of the brain and surrounding structures were obtained without and with intravenous contrast. CONTRAST:  55mL GADAVIST GADOBUTROL 1 MMOL/ML IV SOLN COMPARISON:   11/27/2013 FINDINGS: Brain: Diffusion imaging does not show any acute or subacute infarction. No focal abnormality affects the brainstem or cerebellum. Cerebral hemispheres show generalized atrophy with chronic small-vessel ischemic change of the hemispheric white matter. Old right frontoparietal vertex cortical and subcortical infarction. No evidence of primary or metastatic mass lesion, hemorrhage, hydrocephalus or extra-axial collection. Vascular: Major vessels at the base of the brain show flow. Skull and upper cervical spine: Negative Sinuses/Orbits: Clear/normal Other: None IMPRESSION: 1. No evidence of metastatic disease. 2. Atrophy and chronic small-vessel ischemic changes as outlined above. Old right frontoparietal vertex cortical and subcortical infarction. Electronically Signed   By: Nelson Chimes M.D.   On: 03/22/2020 21:25   NM PET Image Initial (PI) Skull Base To Thigh  Addendum Date: 03/22/2020   ADDENDUM REPORT: 03/22/2020 14:10 ADDENDUM: As noted in the body of the report, there is hypermetabolism in the distal esophagus. This may be related to esophagitis, but consider upper endoscopy to exclude neoplasm. Electronically Signed   By: Misty Stanley M.D.   On: 03/22/2020 14:10   Result Date: 03/22/2020 CLINICAL DATA:  Initial treatment strategy for non-small-cell lung cancer. EXAM: NUCLEAR MEDICINE PET SKULL BASE TO THIGH TECHNIQUE: 10.4 mCi F-18 FDG was injected intravenously. Full-ring PET imaging was performed from the skull base to thigh after the radiotracer. CT data was obtained and used for attenuation correction and anatomic localization. Fasting blood glucose: 127 mg/dl COMPARISON:  Insert CT angio chest 03/04/2020 FINDINGS: Mediastinal blood pool activity: SUV max 3.3 Liver activity: SUV max NA NECK: Asymmetric uptake noted right inferior parotid gland without a discrete mass lesion evident. No lymphadenopathy in this region. Incidental CT findings: none CHEST: Right parahilar mass is  hypermetabolic with central necrosis. SUV max = 23.3. Hypermetabolic activity extends into the right hilum. No hypermetabolic mediastinal or left hilar lymphadenopathy. Focal nodular pleural thickening in the posterior left lung apex (image 55/4). Nodular component to this thickening measures 1.6 x 1.4 cm. SUV max = 5.1 Hypermetabolism is identified in the distal esophagus. Small to moderate hiatal hernia noted. Incidental CT findings: Coronary artery calcification is evident. Atherosclerotic calcification is noted in the wall of the thoracic aorta. Centrilobular emphsyema noted. ABDOMEN/PELVIS: No abnormal hypermetabolic activity within the liver, pancreas, adrenal glands, or spleen. No hypermetabolic lymph nodes in the abdomen or pelvis. There is marked focal hypermetabolism in the region of the ileocecal valve, potentially with mild intussusception in the terminal ileum. Although this is a noncontrast study,  there is a suggestion of a soft tissue lesion in the cecum (see axial 158/4). As there are other areas of focal hypermetabolism in the colon, this may be physiologic. Incidental CT findings: There is abdominal aortic atherosclerosis without aneurysm. Right common iliac artery is aneurysmal up to 3.9 cm diameter. Left common iliac artery at the bifurcation is 1.7 cm diameter. Advanced diverticular disease noted in the left colon. SKELETON: No focal hypermetabolic activity to suggest skeletal metastasis. Incidental CT findings: none IMPRESSION: 1. Right parahilar mass lesion is markedly hypermetabolic consistent with known malignancy. 2. Hypermetabolic nodular pleural thickening posterior left lung apex. This could represent metastatic disease or a synchronous primary. 3. Marked focal hypermetabolism in the right colon, in the region the ileocecal valve. Noncontrast CT imaging for attenuation correction raises concern for a soft tissue lesion at this location. CT abdomen/pelvis with oral and intravenous  contrast would likely prove helpful to further evaluate. There may be an associated short segment intussusception of the terminal ileum. 4. Right common iliac artery aneurysm measuring up to 3.9 cm diameter. Interventional Radiology consultation recommended. 5. Asymmetric uptake inferior right parotid region without discrete mass lesion or lymphadenopathy evident. CT neck with contrast may prove helpful to assess for lesion not visible on noncontrast CT imaging today. 6.  Emphysema. (ICD10-J43.9) 7.  Aortic Atherosclerois (ICD10-170.0) Electronically Signed: By: Misty Stanley M.D. On: 03/22/2020 13:39   CT BIOPSY  Result Date: 04/12/2020 INDICATION: 79 year old male with a history of right-sided lung cancer and a new hypermetabolic pleural based nodule in the periphery of the left lung apex. It is unclear if this represents a metastasis or a second primary lung malignancy. He presents for CT-guided biopsy to facilitate tissue diagnosis. EXAM: CT-guided biopsy left upper lobe pulmonary nodule Interventional Radiologist:  Criselda Peaches, MD MEDICATIONS: None. ANESTHESIA/SEDATION: Fentanyl 100 mcg IV; Versed 2 mg IV Moderate Sedation Time:  10 minutes The patient was continuously monitored during the procedure by the interventional radiology nurse under my direct supervision. FLUOROSCOPY TIME:  None. COMPLICATIONS: None immediate. Estimated blood loss:  0 PROCEDURE: Informed written consent was obtained from the patient after a thorough discussion of the procedural risks, benefits and alternatives. All questions were addressed. Maximal Sterile Barrier Technique was utilized including caps, mask, sterile gowns, sterile gloves, sterile drape, hand hygiene and skin antiseptic. A timeout was performed prior to the initiation of the procedure. A planning axial CT scan was performed. The nodule in the left upper lobe was successfully identified. A suitable skin entry site was selected and marked. The region was then  sterilely prepped and draped in standard fashion using Betadine skin prep. Local anesthesia was attained by infiltration with 1% lidocaine. A small dermatotomy was made. Under intermittent CT fluoroscopic guidance, a 17 gauge trocar needle was advanced into the lung and positioned at the margin of the nodule. Multiple 18 gauge core biopsies were then coaxially obtained using the BioPince automated biopsy device. Biopsy specimens were placed in formalin and delivered to pathology for further analysis. The biopsy device and introducer needle were removed. Post biopsy axial CT imaging demonstrates no evidence of immediate complication. There is no pneumothorax. Mild perilesional alveolar hemorrhage is not unexpected. The patient tolerated the procedure well. IMPRESSION: Technically successful CT-guided biopsy left upper lobe pulmonary nodule. Electronically Signed   By: Jacqulynn Cadet M.D.   On: 04/12/2020 14:06   DG Chest Port 1 View  Result Date: 04/12/2020 CLINICAL DATA:  Status post lung biopsy. EXAM: PORTABLE CHEST 1 VIEW  COMPARISON:  April 07, 2020. FINDINGS: Heart size is within normal limits. Stable appearance of right infrahilar mass. No pneumothorax or pleural effusion is noted. The visualized skeletal structures are unremarkable. IMPRESSION: Stable appearance of right infrahilar mass. No other abnormality seen in the chest. Electronically Signed   By: Marijo Conception M.D.   On: 04/12/2020 13:54   DG Chest Port 1 View  Result Date: 04/07/2020 CLINICAL DATA:  Stage III right lower lobe non-small cell lung cancer, dyspnea, cough EXAM: PORTABLE CHEST 1 VIEW COMPARISON:  03/04/2020 FINDINGS: 2 frontal views of the chest demonstrate right infrahilar mass unchanged since prior study. No new consolidation, effusion, or pneumothorax. Cardiac silhouette is stable. Loop recorder unchanged. IMPRESSION: 1. Stable right infrahilar mass. 2. No acute airspace disease. Electronically Signed   By: Randa Ngo M.D.   On: 04/07/2020 20:51    Microbiology: Recent Results (from the past 240 hour(s))  Culture, blood (routine x 2)     Status: None   Collection Time: 04/07/20  8:06 PM   Specimen: BLOOD  Result Value Ref Range Status   Specimen Description   Final    BLOOD LEFT ANTECUBITAL Performed at [facility] [address], [place], [place] [address]    Special Requests   Final    BOTTLES DRAWN AEROBIC AND ANAEROBIC Blood Culture results may not be optimal due to an excessive volume of blood received in culture bottles Performed at [facility] [address] [place], [place] [address]    Culture   Final    NO GROWTH 5 DAYS Performed at [facility], [place] [address], [place], [place] [address]    Report Status 04/13/2020 FINAL  Final  Culture, blood (routine x 2)     Status: None   Collection Time: 04/07/20  8:11 PM   Specimen: BLOOD  Result Value Ref Range Status   Specimen Description   Final    BLOOD RIGHT ANTECUBITAL Performed at [facility] [address] [address] [place], [place] [address]    Special Requests   Final    BOTTLES DRAWN AEROBIC AND ANAEROBIC Blood Culture results may not be optimal due to an excessive volume of blood received in culture bottles Performed at [facility] [address], [place], [place] [address]    Culture   Final    NO GROWTH 5 DAYS Performed at [facility], [address] [place], [place] [address]    Report Status 04/13/2020 FINAL  Final  SARS Coronavirus 2 by RT PCR (hospital order, performed in [facility] hospital lab) Nasopharyngeal Nasopharyngeal Swab     Status: None   Collection Time: 04/07/20  8:15 PM   Specimen: Nasopharyngeal Swab  Result Value Ref Range Status   SARS Coronavirus 2 NEGATIVE NEGATIVE Final    Comment: (NOTE) SARS-CoV-2 target nucleic acids are NOT DETECTED.  The SARS-CoV-2 RNA is generally detectable in upper and  lower respiratory specimens during the acute phase of infection. The lowest concentration of SARS-CoV-2 viral copies this assay can detect is 250 copies / mL. A negative result does not preclude SARS-CoV-2 infection and should not be used as the sole basis for treatment or other patient management decisions.  A negative result may occur with improper specimen collection / handling, submission of specimen other than nasopharyngeal swab, presence of viral mutation(s) within the areas targeted by this assay, and inadequate number of viral copies (<250 copies / mL). A  negative result must be combined with clinical observations, patient history, and epidemiological information.  Fact Sheet for Patients:   StrictlyIdeas.no  Fact Sheet for Healthcare Providers: BankingDealers.co.za  This test is not yet approved or  cleared by the [place] FDA and has been authorized for detection and/or diagnosis of SARS-CoV-2 by FDA under an Emergency Use Authorization (EUA).  This EUA will remain in effect (meaning this test can be used) for the duration of the COVID-19 declaration under Section 564(b)(1) of the Act, 21 U.S.C. section 360bbb-3(b)(1), unless the authorization is terminated or revoked sooner.  Performed at [facility], [place] [address] [place], [place] [address]      Labs: Basic Metabolic Panel: Recent Labs  Lab 04/10/20 0554 04/10/20 0554 04/11/20 0525 04/12/20 0512 04/13/20 0555 04/14/20 0540 04/15/20 0610  NA 139   < > 135 137 136 136 138  K 3.7   < > 3.5 3.3* 3.1* 3.4* 3.0*  CL 110   < > 106 108 109 109 108  CO2 19*   < > 17* 17* 18* 17* 19*  GLUCOSE 128*   < > 101* 126* 110* 105* 98  BUN 12   < > 10 14 9 10 11   CREATININE 0.66   < > 0.59* 0.61 0.61 0.64 0.56*  CALCIUM 7.9*   < > 7.8* 7.9* 7.8* 7.7* 8.0*  MG 2.1  --  1.9 2.1 1.9  --  2.0   < > = values in this interval not displayed.   Liver  Function Tests: Recent Labs  Lab 04/10/20 0554  AST 15  ALT 14  ALKPHOS 47  BILITOT 1.0  PROT 6.0*  ALBUMIN 2.2*   No results for input(s): LIPASE, AMYLASE in the last 168 hours. Recent Labs  Lab 04/10/20 0554  AMMONIA 15   CBC: Recent Labs  Lab 04/10/20 0554 04/10/20 0554 04/11/20 0525 04/12/20 0512 04/13/20 0555 04/14/20 0540 04/15/20 0610  WBC 2.3*   < > 2.2* 2.5* 2.6* 2.9* 2.6*  NEUTROABS 1.6*  --  1.4* 1.6* 1.7  --  1.5*  HGB 7.8*   < > 7.7* 7.5* 7.5* 7.5* 7.7*  HCT 26.1*   < > 26.0* 25.3* 25.0* 25.8* 25.7*  MCV 77.0*   < > 78.5* 78.8* 78.6* 80.6 77.9*  PLT 203   < > 203 215 196 219 223   < > = values in this interval not displayed.   Cardiac Enzymes: No results for input(s): CKTOTAL, CKMB, CKMBINDEX, TROPONINI in the last 168 hours. BNP: BNP (last 3 results) Recent Labs    04/07/20 2007  BNP 101.6*    ProBNP (last 3 results) No results for input(s): PROBNP in the last 8760 hours.  CBG: Recent Labs  Lab 04/14/20 1137 04/14/20 1653 04/14/20 2040 04/15/20 0725 04/15/20 1143  GLUCAP 107* 166* 97 94 114*       Signed:  Irine Seal MD.  [facility] 04/15/2020, 3:20 PM

## 2020-04-15 NOTE — Progress Notes (Signed)
Physical Therapy Treatment Patient Details Name: Scott Conley MRN: [id number] DOB: February 13, 1941 Today's Date: 04/15/2020    History of Present Illness Patient is a 79 y/o male who presents with SOB, post-obstructive PNA and cough. CXR- new massive opacity right infrahilar lung likely secondary to bronchogenic carcinoma. s/p Tumor debulking right bronchus intermedius 8/8. PMH includes CVA with left residual weakness, HTN and DM.    PT Comments    Max encouragement for participation. Pt repeatedly stated "I can't do this. I can't do nothing. This cancer won't let me do nothing". He is resistant to progression of activity. Min assist for mobility on today. Discussed d/c plan-pt stated he wants to just go home. I have some concerns about his ability to manage at home alone-per pt, daughter works. Will continue to follow and progress activity as pt allows.     Follow Up Recommendations  SNF (HHPT and 24/7 supervision/assist if pt declines placement)     Equipment Recommendations  None recommended by PT    Recommendations for Other Services       Precautions / Restrictions Precautions Precautions: Fall Restrictions Weight Bearing Restrictions: No    Mobility  Bed Mobility Overal bed mobility: Needs Assistance Bed Mobility: Supine to Sit     Supine to sit: Min assist;HOB elevated     General bed mobility comments: Assist to scoot to EOB. Task was efforful for pt.  Transfers Overall transfer level: Needs assistance Equipment used: Rolling walker (2 wheeled) Transfers: Sit to/from Stand Sit to Stand: Min guard;From elevated surface         General transfer comment: VCs safety, technique, hand placement.  Ambulation/Gait Ambulation/Gait assistance: Min assist Gait Distance (Feet): 15 Feet Assistive device: Rolling walker (2 wheeled) Gait Pattern/deviations: Step-through pattern;Decreased step length - right;Decreased step length - left;Shuffle;Trunk flexed     General  Gait Details: Cues for safety, posture, RW proximity. Distanced limited by pt-"I can't do this. I can't do nothing"   Stairs             Wheelchair Mobility    Modified Rankin (Stroke Patients Only)       Balance Overall balance assessment: Needs assistance         Standing balance support: Bilateral upper extremity supported Standing balance-Leahy Scale: Poor                              Cognition Arousal/Alertness: Awake/alert Behavior During Therapy: WFL for tasks assessed/performed Overall Cognitive Status: Within Functional Limits for tasks assessed                                        Exercises      General Comments        Pertinent Vitals/Pain Pain Assessment: Faces Faces Pain Scale: Hurts little more Pain Location: arm Pain Descriptors / Indicators: Discomfort;Sore Pain Intervention(s): Limited activity within patient's tolerance;Monitored during session;Repositioned    Home Living                      Prior Function            PT Goals (current goals can now be found in the care plan section) Progress towards PT goals: Progressing toward goals    Frequency    Min 3X/week      PT Plan Discharge plan needs  to be updated    Co-evaluation              AM-PAC PT "6 Clicks" Mobility   Outcome Measure  Help needed turning from your back to your side while in a flat bed without using bedrails?: A Little Help needed moving from lying on your back to sitting on the side of a flat bed without using bedrails?: A Little Help needed moving to and from a bed to a chair (including a wheelchair)?: A Little Help needed standing up from a chair using your arms (e.g., wheelchair or bedside chair)?: A Little Help needed to walk in hospital room?: A Little Help needed climbing 3-5 steps with a railing? : A Lot 6 Click Score: 17    End of Session Equipment Utilized During Treatment: Gait belt Activity  Tolerance: Patient limited by fatigue Patient left: in chair;with call bell/phone within reach;with chair alarm set   PT Visit Diagnosis: Unsteadiness on feet (R26.81);Difficulty in walking, not elsewhere classified (R26.2);Muscle weakness (generalized) (M62.81)     Time: 5009-3818 PT Time Calculation (min) (ACUTE ONLY): 11 min  Charges:  $Gait Training: 8-22 mins                         Doreatha Massed, PT Acute Rehabilitation  Office: [phone] Pager: [phone]

## 2020-04-15 NOTE — Progress Notes (Deleted)
[place] OFFICE PROGRESS NOTE  [place] Circle, FNP [address] [place] [address]  DIAGNOSIS: Stage IIIB (T3, N2, M0) Non-Small Cell Lung Cancer, Adenocarcinoma. He presented with a right paratracheal mass, nodal pleural thickening posterior left lung apex. There was marked focal hypermetabolism in the right colon, in the region the ileocecal valve as well as hypermetabolism in the distal esophagus which may be related to esophagitis but cannot exclude neoplasm. He was diagnosed in August 2021.   PDL1 Expression: 100%  Molecular Biomarkers: Microsatellite status - MS-Stable Tumor Mutational Burden - 4 Muts/Mb Genomic Findings For a complete list of the genes assayed, please refer to the Appendix. KRAS G12V CDKN2A/B p16INK4a W110* and p14ARF G125R JAK2 V617F TERT promoter -124C>T TP53 W91* 7 Disease relevant genes with no reportable alterations: ALK, BRAF, EGFR, ERBB2, MET, RET, ROS1  PRIOR THERAPY: None  CURRENT THERAPY: Concurrent chemoradiation with carboplatin for an AUC of 2 and paclitaxel 45 mg/m2. First dose on 03/28/20. Status post 2 cycles.   INTERVAL HISTORY: MACKENZY EISENBERG 79 y.o. male returns to the clinic today for a follow up visit. The patient is feeling _ today. The patient was recently found to have lung cancer. He had his staging PET scan which noted focal hypermetabolism in the colon as well as hypermetabolism in the esophagus which may be related to esophagitis, but cannot exclude neoplasm. He was referred to GI and had an appointment; however, he has a scheduling conflict with his appointment time and was unable to make it. Of note, his labs showed he had iron deficiency anemia, even prior to starting any systemic treatment. He was instructed on his visit on 03/28/20 to start taking iron supplements and rescheduled with GI.   In the interval since his last office visit on 03/28/20, the patient had underwent 2 weekly cycles of  concurrent chemoradiation. He was hospitalized from 04/07/20-04/15/20 for post obstructive pneumonia. He was treated with anti-biotics. He was also evaluated inpatient by GI and had evidence of esophagitis. He is supposed to have an outpatient follow up with them. A colonoscopy was unable to be performed inpatient due to the patient being unable to do the prep.   Today, he feels _. He is here for evaluation before consideration of starting cycle #3 of chemotherapy today.   MEDICAL HISTORY: Past Medical History:  Diagnosis Date  . Aortic atherosclerosis ([place])   . Arthritis   . Blood clot in vein   . CVA (cerebral infarction)   . Diabetes ([place])   . DVT (deep venous thrombosis) ([place])   . Emphysema lung ([place])   . Hypertension   . Lung cancer ([place])   . Osteoarthritis   . Stroke [facility])    Left hand stiffness     ALLERGIES:  has No Known Allergies.  MEDICATIONS:  Current Outpatient Medications  Medication Sig Dispense Refill  . arformoterol (BROVANA) 15 MCG/2ML NEBU Take 2 mLs (15 mcg total) by nebulization 2 (two) times daily. 120 mL 1  . aspirin EC 81 MG tablet Take 1 tablet (81 mg total) by mouth daily. Swallow whole. 360 tablet 0  . atorvastatin (LIPITOR) 80 MG tablet Take 1 tablet (80 mg total) by mouth daily. Must keep appt w/new provider for future refills 30 tablet 0  . bisacodyl (DULCOLAX) 10 MG suppository Place 1 suppository (10 mg total) rectally daily as needed for severe constipation. (Patient not taking: Reported on 03/20/2020) 12 suppository 0  . budesonide (PULMICORT) 0.25  MG/2ML nebulizer solution Take 2 mLs (0.25 mg total) by nebulization 2 (two) times daily. 360 each 0  . carvedilol (COREG) 6.25 MG tablet Take 1 tablet (6.25 mg total) by mouth 2 (two) times daily with a meal. 60 tablet 0  . cefdinir (OMNICEF) 300 MG capsule Take 1 capsule (300 mg total) by mouth every 12 (twelve) hours for 3 days. 6 capsule 0  . docusate sodium (COLACE) 100 MG capsule Take 200 mg by mouth 2  (two) times daily.    Derrill Memo ON 04/16/2020] feeding supplement, ENSURE ENLIVE, (ENSURE ENLIVE) LIQD Take 237 mLs by mouth 2 (two) times daily between meals. 237 mL 12  . [START ON 1/74/0814] folic acid (FOLVITE) 1 MG tablet Take 1 tablet (1 mg total) by mouth daily.    Derrill Memo ON 04/16/2020] Multiple Vitamin (MULTIVITAMIN WITH MINERALS) TABS tablet Take 1 tablet by mouth daily.    . pantoprazole (PROTONIX) 40 MG tablet Take 1 tablet (40 mg total) by mouth 2 (two) times daily. 60 tablet 1  . polyethylene glycol (MIRALAX) 17 g packet Take 17 g by mouth daily as needed for moderate constipation. (Patient not taking: Reported on 03/20/2020) 14 each 0  . prochlorperazine (COMPAZINE) 10 MG tablet Take 1 tablet (10 mg total) by mouth every 6 (six) hours as needed for nausea or vomiting. 30 tablet 0  . senna (SENOKOT) 8.6 MG TABS tablet Take 2 tablets (17.2 mg total) by mouth daily as needed for moderate constipation. (Patient not taking: Reported on 03/20/2020) 120 tablet 0  . sitaGLIPtin (JANUVIA) 100 MG tablet Take 1 tablet (100 mg total) by mouth daily. Must keep appt w/new provider for future refills 30 tablet 1  . sucralfate (CARAFATE) 1 GM/10ML suspension Take 10 mLs (1 g total) by mouth 4 (four) times daily -  with meals and at bedtime. 1200 mL 0  . traMADol (ULTRAM) 50 MG tablet Take 1 tablet (50 mg total) by mouth every 6 (six) hours as needed for moderate pain. 20 tablet 0   No current facility-administered medications for this visit.   Facility-Administered Medications Ordered in Other Visits  Medication Dose Route Frequency Provider Last Rate Last Admin  . acetaminophen (TYLENOL) tablet 650 mg  650 mg Oral Q6H PRN Rise Patience, MD   650 mg at 04/15/20 1049   Or  . acetaminophen (TYLENOL) suppository 650 mg  650 mg Rectal Q6H PRN Rise Patience, MD      . albuterol (PROVENTIL) (2.5 MG/3ML) 0.083% nebulizer solution 2.5 mg  2.5 mg Nebulization Q2H PRN Rise Patience, MD       . albuterol (PROVENTIL) (2.5 MG/3ML) 0.083% nebulizer solution 2.5 mg  2.5 mg Nebulization BID Aline August, MD   2.5 mg at 04/15/20 0738  . aspirin EC tablet 81 mg  81 mg Oral Daily Rise Patience, MD   81 mg at 04/15/20 0843  . atorvastatin (LIPITOR) tablet 80 mg  80 mg Oral Daily Rise Patience, MD   80 mg at 04/15/20 0842  . budesonide (PULMICORT) nebulizer solution 0.25 mg  0.25 mg Nebulization BID Rise Patience, MD   0.25 mg at 04/15/20 0739  . carvedilol (COREG) tablet 6.25 mg  6.25 mg Oral BID WC Rise Patience, MD   6.25 mg at 04/15/20 0742  . cefdinir (OMNICEF) capsule 300 mg  300 mg Oral Q12H Eugenie Filler, MD   300 mg at 04/15/20 0842  . docusate sodium (COLACE) capsule 200 mg  200 mg Oral BID Rise Patience, MD   200 mg at 04/15/20 9191  . enoxaparin (LOVENOX) injection 40 mg  40 mg Subcutaneous Q24H Allred, Darrell K, PA-C   40 mg at 04/15/20 0844  . feeding supplement (ENSURE ENLIVE) (ENSURE ENLIVE) liquid 237 mL  237 mL Oral BID BM Starla Link, Kshitiz, MD   237 mL at 04/15/20 0844  . folic acid (FOLVITE) tablet 1 mg  1 mg Oral Daily Aline August, MD   1 mg at 04/15/20 0843  . influenza vaccine adjuvanted (FLUAD) injection 0.5 mL  0.5 mL Intramuscular Tomorrow-1000 Alekh, Kshitiz, MD      . insulin aspart (novoLOG) injection 0-9 Units  0-9 Units Subcutaneous TID WC Rise Patience, MD   2 Units at 04/14/20 1743  . ketorolac (TORADOL) 30 MG/ML injection 15 mg  15 mg Intravenous Q6H PRN Eugenie Filler, MD   15 mg at 04/12/20 0930  . multivitamin with minerals tablet 1 tablet  1 tablet Oral Daily Aline August, MD   1 tablet at 04/15/20 0843  . ondansetron (ZOFRAN) tablet 4 mg  4 mg Oral Q6H PRN Rise Patience, MD       Or  . ondansetron [facility]) injection 4 mg  4 mg Intravenous Q6H PRN Rise Patience, MD      . pantoprazole (PROTONIX) EC tablet 40 mg  40 mg Oral BID Eugenie Filler, MD   40 mg at 04/15/20 0843  . sucralfate  (CARAFATE) 1 GM/10ML suspension 1 g  1 g Oral TID WC & HS Mansouraty, Telford Nab., MD   1 g at 04/15/20 1137  . traMADol (ULTRAM) tablet 50 mg  50 mg Oral Q6H PRN Eugenie Filler, MD   50 mg at 04/15/20 1049    SURGICAL HISTORY:  Past Surgical History:  Procedure Laterality Date  . APPENDECTOMY    . BIOPSY  03/05/2020   Procedure: BIOPSY;  Surgeon: Garner Nash, DO;  Location: [facility] ENDOSCOPY;  Service: Pulmonary;;  . BIOPSY  04/11/2020   Procedure: BIOPSY;  Surgeon: Irving Copas., MD;  Location: [facility];  Service: Gastroenterology;;  . Cydney Ok  03/05/2020   Procedure: CRYOTHERAPY;  Surgeon: Garner Nash, DO;  Location: [facility] ENDOSCOPY;  Service: Pulmonary;;  . CYST REMOVAL LEG     removed from right groin  . ESOPHAGOGASTRODUODENOSCOPY (EGD) WITH PROPOFOL N/A 04/11/2020   Procedure: ESOPHAGOGASTRODUODENOSCOPY (EGD) WITH PROPOFOL;  Surgeon: Rush Landmark Telford Nab., MD;  Location: [facility] ENDOSCOPY;  Service: Gastroenterology;  Laterality: N/A;  . HEMOSTASIS CONTROL  03/05/2020   Procedure: HEMOSTASIS CONTROL;  Surgeon: Garner Nash, DO;  Location: [facility] ENDOSCOPY;  Service: Pulmonary;;  . LOOP RECORDER IMPLANT  11/29/13   MDT LinQ implanted by Dr Caryl Comes for cryptogenic stroke  . LOOP RECORDER IMPLANT N/A 11/29/2013   Procedure: LOOP RECORDER IMPLANT;  Surgeon: Deboraha Sprang, MD;  Location: [facility] CATH LAB;  Service: Cardiovascular;  Laterality: N/A;  . TEE WITHOUT CARDIOVERSION N/A 11/29/2013   Procedure: TRANSESOPHAGEAL ECHOCARDIOGRAM (TEE);  Surgeon: Sueanne Margarita, MD;  Location: [facility] ENDOSCOPY;  Service: Cardiovascular;  Laterality: N/A;  . TONSILLECTOMY    . VIDEO BRONCHOSCOPY WITH ENDOBRONCHIAL ULTRASOUND N/A 03/05/2020   Procedure: VIDEO BRONCHOSCOPY WITH ENDOBRONCHIAL ULTRASOUND;  Surgeon: Garner Nash, DO;  Location: [place];  Service: Pulmonary;  Laterality: N/A;    REVIEW OF SYSTEMS:   Review of Systems  Constitutional: Negative for appetite change, chills, fatigue,  fever and unexpected weight change.  HENT:  Negative for mouth sores, nosebleeds, sore throat and trouble swallowing.   Eyes: Negative for eye problems and icterus.  Respiratory: Negative for cough, hemoptysis, shortness of breath and wheezing.   Cardiovascular: Negative for chest pain and leg swelling.  Gastrointestinal: Negative for abdominal pain, constipation, diarrhea, nausea and vomiting.  Genitourinary: Negative for bladder incontinence, difficulty urinating, dysuria, frequency and hematuria.   Musculoskeletal: Negative for back pain, gait problem, neck pain and neck stiffness.  Skin: Negative for itching and rash.  Neurological: Negative for dizziness, extremity weakness, gait problem, headaches, light-headedness and seizures.  Hematological: Negative for adenopathy. Does not bruise/bleed easily.  Psychiatric/Behavioral: Negative for confusion, depression and sleep disturbance. The patient is not nervous/anxious.     PHYSICAL EXAMINATION:  There were no vitals taken for this visit.  ECOG PERFORMANCE STATUS: {CHL ONC ECOG [id number]  Physical Exam  Constitutional: Oriented to person, place, and time and well-developed, well-nourished, and in no distress. No distress.  HENT:  Head: Normocephalic and atraumatic.  Mouth/Throat: Oropharynx is clear and moist. No oropharyngeal exudate.  Eyes: Conjunctivae are normal. Right eye exhibits no discharge. Left eye exhibits no discharge. No scleral icterus.  Neck: Normal range of motion. Neck supple.  Cardiovascular: Normal rate, regular rhythm, normal heart sounds and intact distal pulses.   Pulmonary/Chest: Effort normal and breath sounds normal. No respiratory distress. No wheezes. No rales.  Abdominal: Soft. Bowel sounds are normal. Exhibits no distension and no mass. There is no tenderness.  Musculoskeletal: Normal range of motion. Exhibits no edema.  Lymphadenopathy:    No cervical adenopathy.  Neurological: Alert and oriented  to person, place, and time. Exhibits normal muscle tone. Gait normal. Coordination normal.  Skin: Skin is warm and dry. No rash noted. Not diaphoretic. No erythema. No pallor.  Psychiatric: Mood, memory and judgment normal.  Vitals reviewed.  LABORATORY DATA: Lab Results  Component Value Date   WBC 2.6 (L) 04/15/2020   HGB 7.7 (L) 04/15/2020   HCT 25.7 (L) 04/15/2020   MCV 77.9 (L) 04/15/2020   PLT 223 04/15/2020      Chemistry      Component Value Date/Time   NA 138 04/15/2020 0610   K 3.0 (L) 04/15/2020 0610   CL 108 04/15/2020 0610   CO2 19 (L) 04/15/2020 0610   BUN 11 04/15/2020 0610   CREATININE 0.56 (L) 04/15/2020 0610   CREATININE 0.77 04/04/2020 1143      Component Value Date/Time   CALCIUM 8.0 (L) 04/15/2020 0610   ALKPHOS 47 04/10/2020 0554   AST 15 04/10/2020 0554   AST 9 (L) 04/04/2020 1143   ALT 14 04/10/2020 0554   ALT 12 04/04/2020 1143   BILITOT 1.0 04/10/2020 0554   BILITOT 0.9 04/04/2020 1143       RADIOGRAPHIC STUDIES:  CT HEAD WO CONTRAST  Result Date: 04/09/2020 CLINICAL DATA:  Delirium.  Increased altered mental status. EXAM: CT HEAD WITHOUT CONTRAST TECHNIQUE: Contiguous axial images were obtained from the base of the skull through the vertex without intravenous contrast. COMPARISON:  Brain MRI 03/22/2020, head CT 11/26/2013. FINDINGS: Brain: The examination is significantly motion degraded, limiting evaluation. Stable, moderate generalized parenchymal atrophy. Redemonstrated chronic cortically based infarcts within the right frontoparietal lobes. Stable, moderate ill-defined hypoattenuation within the cerebral white matter which is nonspecific, but consistent with chronic small vessel ischemic disease. No acute intracranial hemorrhage is identified. No acute demarcated cortical infarct is identified. No extra-axial fluid collection. No evidence of intracranial mass. No midline shift. Vascular: No hyperdense vessel.  Atherosclerotic calcifications  Skull: Normal. Negative for fracture or focal lesion. Sinuses/Orbits: Visualized orbits show no acute finding. No significant paranasal sinus disease or mastoid effusion at the imaged levels. IMPRESSION: Significantly motion degraded examination, limiting evaluation. No acute intracranial abnormality is identified. Redemonstrated chronic cortically based infarcts within the right frontoparietal lobes. Stable background moderate generalized parenchymal atrophy and cerebral white matter chronic small vessel ischemic disease. Electronically Signed   By: Kellie Simmering DO   On: 04/09/2020 15:18   CT Angio Chest PE W and/or Wo Contrast  Result Date: 04/07/2020 CLINICAL DATA:  Positive D-dimer. Suspected pulmonary embolus with low to intermediate probability. Diagnosis of stage III lung cancer with radiation today. EXAM: CT ANGIOGRAPHY CHEST WITH CONTRAST TECHNIQUE: Multidetector CT imaging of the chest was performed using the standard protocol during bolus administration of intravenous contrast. Multiplanar CT image reconstructions and MIPs were obtained to evaluate the vascular anatomy. CONTRAST:  143m OMNIPAQUE IOHEXOL 350 MG/ML SOLN COMPARISON:  03/04/2020 FINDINGS: Cardiovascular: There is good opacification of the central and segmental pulmonary arteries. No focal filling defects. No evidence of significant pulmonary embolus. Normal heart size. No pericardial effusions. Normal caliber thoracic aorta. Scattered aortic and coronary artery calcifications. Mediastinum/Nodes: Right hilar lymphadenopathy and right hilar mass as seen on previous studies. The mass measures about 6.6 x 6.5 cm in diameter. Small esophageal hiatal hernia. Esophagus is decompressed. Lungs/Pleura: Emphysematous changes in the lungs. Scarring and pleural thickening in the left apex. Nodular scarring with spiculation in the left apex measures about 11 mm in diameter. Neoplasm is not excluded. Appearance is unchanged since prior study. Right  hilar mass as previously indicated.Postobstructive pneumonia and atelectasis in the right lower lung medially and in the right middle lung. Upper Abdomen: No acute process demonstrated in the visualized upper abdominal contents. Musculoskeletal: No destructive bone lesions. Review of the MIP images confirms the above findings. IMPRESSION: 1. No evidence of significant pulmonary embolus. 2. Right hilar lymphadenopathy and right hilar mass as seen on previous studies. Postobstructive pneumonia and atelectasis in the right lower lung medially and in the right middle lung. 3. Nodular scarring with spiculation in the left apex measures about 11 mm in diameter. Neoplasm is not excluded. 4. Emphysematous changes in the lungs. 5. Small esophageal hiatal hernia. 6. Emphysema and aortic atherosclerosis. Aortic Atherosclerosis (ICD10-I70.0) and Emphysema (ICD10-J43.9). Electronically Signed   By: WLucienne CapersM.D.   On: 04/07/2020 23:19   MR Brain W Wo Contrast  Result Date: 03/22/2020 CLINICAL DATA:  Staging non-small cell lung cancer. EXAM: MRI HEAD WITHOUT AND WITH CONTRAST TECHNIQUE: Multiplanar, multiecho pulse sequences of the brain and surrounding structures were obtained without and with intravenous contrast. CONTRAST:  931mGADAVIST GADOBUTROL 1 MMOL/ML IV SOLN COMPARISON:  11/27/2013 FINDINGS: Brain: Diffusion imaging does not show any acute or subacute infarction. No focal abnormality affects the brainstem or cerebellum. Cerebral hemispheres show generalized atrophy with chronic small-vessel ischemic change of the hemispheric white matter. Old right frontoparietal vertex cortical and subcortical infarction. No evidence of primary or metastatic mass lesion, hemorrhage, hydrocephalus or extra-axial collection. Vascular: Major vessels at the base of the brain show flow. Skull and upper cervical spine: Negative Sinuses/Orbits: Clear/normal Other: None IMPRESSION: 1. No evidence of metastatic disease. 2. Atrophy  and chronic small-vessel ischemic changes as outlined above. Old right frontoparietal vertex cortical and subcortical infarction. Electronically Signed   By: MaNelson Chimes.D.   On: 03/22/2020 21:25   NM PET Image Initial (PI) Skull Base To Thigh  Addendum  Date: 03/22/2020   ADDENDUM REPORT: 03/22/2020 14:10 ADDENDUM: As noted in the body of the report, there is hypermetabolism in the distal esophagus. This may be related to esophagitis, but consider upper endoscopy to exclude neoplasm. Electronically Signed   By: Misty Stanley M.D.   On: 03/22/2020 14:10   Result Date: 03/22/2020 CLINICAL DATA:  Initial treatment strategy for non-small-cell lung cancer. EXAM: NUCLEAR MEDICINE PET SKULL BASE TO THIGH TECHNIQUE: 10.4 mCi F-18 FDG was injected intravenously. Full-ring PET imaging was performed from the skull base to thigh after the radiotracer. CT data was obtained and used for attenuation correction and anatomic localization. Fasting blood glucose: 127 mg/dl COMPARISON:  Insert CT angio chest 03/04/2020 FINDINGS: Mediastinal blood pool activity: SUV max 3.3 Liver activity: SUV max NA NECK: Asymmetric uptake noted right inferior parotid gland without a discrete mass lesion evident. No lymphadenopathy in this region. Incidental CT findings: none CHEST: Right parahilar mass is hypermetabolic with central necrosis. SUV max = 23.3. Hypermetabolic activity extends into the right hilum. No hypermetabolic mediastinal or left hilar lymphadenopathy. Focal nodular pleural thickening in the posterior left lung apex (image 55/4). Nodular component to this thickening measures 1.6 x 1.4 cm. SUV max = 5.1 Hypermetabolism is identified in the distal esophagus. Small to moderate hiatal hernia noted. Incidental CT findings: Coronary artery calcification is evident. Atherosclerotic calcification is noted in the wall of the thoracic aorta. Centrilobular emphsyema noted. ABDOMEN/PELVIS: No abnormal hypermetabolic activity within the  liver, pancreas, adrenal glands, or spleen. No hypermetabolic lymph nodes in the abdomen or pelvis. There is marked focal hypermetabolism in the region of the ileocecal valve, potentially with mild intussusception in the terminal ileum. Although this is a noncontrast study, there is a suggestion of a soft tissue lesion in the cecum (see axial 158/4). As there are other areas of focal hypermetabolism in the colon, this may be physiologic. Incidental CT findings: There is abdominal aortic atherosclerosis without aneurysm. Right common iliac artery is aneurysmal up to 3.9 cm diameter. Left common iliac artery at the bifurcation is 1.7 cm diameter. Advanced diverticular disease noted in the left colon. SKELETON: No focal hypermetabolic activity to suggest skeletal metastasis. Incidental CT findings: none IMPRESSION: 1. Right parahilar mass lesion is markedly hypermetabolic consistent with known malignancy. 2. Hypermetabolic nodular pleural thickening posterior left lung apex. This could represent metastatic disease or a synchronous primary. 3. Marked focal hypermetabolism in the right colon, in the region the ileocecal valve. Noncontrast CT imaging for attenuation correction raises concern for a soft tissue lesion at this location. CT abdomen/pelvis with oral and intravenous contrast would likely prove helpful to further evaluate. There may be an associated short segment intussusception of the terminal ileum. 4. Right common iliac artery aneurysm measuring up to 3.9 cm diameter. Interventional Radiology consultation recommended. 5. Asymmetric uptake inferior right parotid region without discrete mass lesion or lymphadenopathy evident. CT neck with contrast may prove helpful to assess for lesion not visible on noncontrast CT imaging today. 6.  Emphysema. (ICD10-J43.9) 7.  Aortic Atherosclerois (ICD10-170.0) Electronically Signed: By: Misty Stanley M.D. On: 03/22/2020 13:39   CT BIOPSY  Result Date:  04/12/2020 INDICATION: 79 year old male with a history of right-sided lung cancer and a new hypermetabolic pleural based nodule in the periphery of the left lung apex. It is unclear if this represents a metastasis or a second primary lung malignancy. He presents for CT-guided biopsy to facilitate tissue diagnosis. EXAM: CT-guided biopsy left upper lobe pulmonary nodule Interventional Radiologist:  Antonietta Jewel.  Laurence Ferrari, MD MEDICATIONS: None. ANESTHESIA/SEDATION: Fentanyl 100 mcg IV; Versed 2 mg IV Moderate Sedation Time:  10 minutes The patient was continuously monitored during the procedure by the interventional radiology nurse under my direct supervision. FLUOROSCOPY TIME:  None. COMPLICATIONS: None immediate. Estimated blood loss:  0 PROCEDURE: Informed written consent was obtained from the patient after a thorough discussion of the procedural risks, benefits and alternatives. All questions were addressed. Maximal Sterile Barrier Technique was utilized including caps, mask, sterile gowns, sterile gloves, sterile drape, hand hygiene and skin antiseptic. A timeout was performed prior to the initiation of the procedure. A planning axial CT scan was performed. The nodule in the left upper lobe was successfully identified. A suitable skin entry site was selected and marked. The region was then sterilely prepped and draped in standard fashion using Betadine skin prep. Local anesthesia was attained by infiltration with 1% lidocaine. A small dermatotomy was made. Under intermittent CT fluoroscopic guidance, a 17 gauge trocar needle was advanced into the lung and positioned at the margin of the nodule. Multiple 18 gauge core biopsies were then coaxially obtained using the BioPince automated biopsy device. Biopsy specimens were placed in formalin and delivered to pathology for further analysis. The biopsy device and introducer needle were removed. Post biopsy axial CT imaging demonstrates no evidence of immediate  complication. There is no pneumothorax. Mild perilesional alveolar hemorrhage is not unexpected. The patient tolerated the procedure well. IMPRESSION: Technically successful CT-guided biopsy left upper lobe pulmonary nodule. Electronically Signed   By: Jacqulynn Cadet M.D.   On: 04/12/2020 14:06   DG Chest Port 1 View  Result Date: 04/12/2020 CLINICAL DATA:  Status post lung biopsy. EXAM: PORTABLE CHEST 1 VIEW COMPARISON:  April 07, 2020. FINDINGS: Heart size is within normal limits. Stable appearance of right infrahilar mass. No pneumothorax or pleural effusion is noted. The visualized skeletal structures are unremarkable. IMPRESSION: Stable appearance of right infrahilar mass. No other abnormality seen in the chest. Electronically Signed   By: Marijo Conception M.D.   On: 04/12/2020 13:54   DG Chest Port 1 View  Result Date: 04/07/2020 CLINICAL DATA:  Stage III right lower lobe non-small cell lung cancer, dyspnea, cough EXAM: PORTABLE CHEST 1 VIEW COMPARISON:  03/04/2020 FINDINGS: 2 frontal views of the chest demonstrate right infrahilar mass unchanged since prior study. No new consolidation, effusion, or pneumothorax. Cardiac silhouette is stable. Loop recorder unchanged. IMPRESSION: 1. Stable right infrahilar mass. 2. No acute airspace disease. Electronically Signed   By: Randa Ngo M.D.   On: 04/07/2020 20:51     ASSESSMENT/PLAN:  This is a very pleasant 79 year old African American male with stage IIIB (T3, N2, M0) Non-Small Cell Lung Cancer, Adenocarcinoma. He presented with a right paratracheal mass, nodal pleural thickening posterior left lung apex. There was marked focal hypermetabolism in the right colon, in the region the ileocecal valve as well as distal esophagus which may be related to esophagitis but neoplasm cannot be excluded. He was diagnosed in August 2021. His PDL1 expression is 100% and he has no actionable mutations.   He is scheduled to begin concurrent chemoradiation  with carboplatin for an AUC if 2 and paclitaxel 45 mg/m2 weekly. He is status post 2 cycles. He was recently hospitalized and is feeling _ today.   The patient was seen with Dr. Julien Nordmann. Dr. Julien Nordmann recommends _. He will _ with cycle #3 today.   He was encouraged to follow up as scheduled with GI.  He  was instructed to take an oral iron supplement.   We will see him back for a follow up visit in 2 weeks for evaluation before starting cycle #5.   The patient was advised to call immediately if he has any concerning symptoms in the interval. The patient voices understanding of current disease status and treatment options and is in agreement with the current care plan. All questions were answered. The patient knows to call the clinic with any problems, questions or concerns. We can certainly see the patient much sooner if necessary  No orders of the defined types were placed in this encounter.    Ermine Stebbins L Kennedi Lizardo, PA-C 04/15/20

## 2020-04-15 NOTE — Plan of Care (Signed)

## 2020-04-15 NOTE — Progress Notes (Signed)
Rn discussed d/c instructions with pt and his family. No questions on d/c instructions and education. Rn did encourage pt to eat more at home to help with his weakness. Pt to follow up with Gi and oncology. Home Health also arranged prior to d/c.

## 2020-04-15 NOTE — Plan of Care (Signed)
  Problem: Clinical Measurements: Goal: Respiratory complications will improve Outcome: Progressing   Problem: Clinical Measurements: Goal: Cardiovascular complication will be avoided Outcome: Progressing   Problem: Elimination: Goal: Will not experience complications related to bowel motility Outcome: Progressing   Problem: Pain Managment: Goal: General experience of comfort will improve Outcome: Progressing   Problem: Safety: Goal: Ability to remain free from injury will improve Outcome: Progressing   Problem: Skin Integrity: Goal: Risk for impaired skin integrity will decrease Outcome: Progressing

## 2020-04-15 NOTE — Progress Notes (Signed)
Dr. Grandville Silos to call family about update on discharge plan. Pt remains stable. Home health is arranged for discharge. Rn will continue to monitor.

## 2020-04-15 NOTE — Plan of Care (Signed)
Pt to d/c home with family today. No needs at this time. No changes in patient condition.

## 2020-04-17 ENCOUNTER — Inpatient Hospital Stay: Payer: Medicare HMO

## 2020-04-17 ENCOUNTER — Other Ambulatory Visit: Payer: Self-pay

## 2020-04-17 ENCOUNTER — Ambulatory Visit: Payer: Medicare HMO | Admitting: Physician Assistant

## 2020-04-17 ENCOUNTER — Encounter (HOSPITAL_COMMUNITY): Payer: Self-pay

## 2020-04-17 ENCOUNTER — Ambulatory Visit: Payer: Medicare HMO

## 2020-04-17 ENCOUNTER — Observation Stay (HOSPITAL_COMMUNITY)
Admission: EM | Admit: 2020-04-17 | Discharge: 2020-04-20 | Disposition: A | Discharge status: Home / Self Care | Payer: Medicare HMO | Attending: Internal Medicine | Admitting: Internal Medicine

## 2020-04-17 DIAGNOSIS — R531 Weakness: Secondary | ICD-10-CM | POA: Diagnosis not present

## 2020-04-17 DIAGNOSIS — C3412 Malignant neoplasm of upper lobe, left bronchus or lung: Secondary | ICD-10-CM | POA: Diagnosis not present

## 2020-04-17 DIAGNOSIS — E860 Dehydration: Secondary | ICD-10-CM | POA: Insufficient documentation

## 2020-04-17 DIAGNOSIS — M6281 Muscle weakness (generalized): Secondary | ICD-10-CM | POA: Diagnosis not present

## 2020-04-17 DIAGNOSIS — D649 Anemia, unspecified: Secondary | ICD-10-CM | POA: Diagnosis not present

## 2020-04-17 DIAGNOSIS — J189 Pneumonia, unspecified organism: Secondary | ICD-10-CM | POA: Insufficient documentation

## 2020-04-17 DIAGNOSIS — E119 Type 2 diabetes mellitus without complications: Secondary | ICD-10-CM | POA: Insufficient documentation

## 2020-04-17 DIAGNOSIS — E440 Moderate protein-calorie malnutrition: Secondary | ICD-10-CM | POA: Insufficient documentation

## 2020-04-17 DIAGNOSIS — E876 Hypokalemia: Secondary | ICD-10-CM | POA: Diagnosis not present

## 2020-04-17 DIAGNOSIS — R52 Pain, unspecified: Secondary | ICD-10-CM | POA: Diagnosis not present

## 2020-04-17 DIAGNOSIS — I10 Essential (primary) hypertension: Secondary | ICD-10-CM | POA: Diagnosis not present

## 2020-04-17 DIAGNOSIS — Z723 Lack of physical exercise: Secondary | ICD-10-CM | POA: Diagnosis not present

## 2020-04-17 DIAGNOSIS — Z8673 Personal history of transient ischemic attack (TIA), and cerebral infarction without residual deficits: Secondary | ICD-10-CM | POA: Diagnosis not present

## 2020-04-17 DIAGNOSIS — Z20822 Contact with and (suspected) exposure to covid-19: Secondary | ICD-10-CM | POA: Insufficient documentation

## 2020-04-17 DIAGNOSIS — E785 Hyperlipidemia, unspecified: Secondary | ICD-10-CM | POA: Insufficient documentation

## 2020-04-17 LAB — BASIC METABOLIC PANEL
Anion gap: 15 (ref 5–15)
BUN: 9 mg/dL (ref 8–23)
CO2: 19 mmol/L — ABNORMAL LOW (ref 22–32)
Calcium: 8.3 mg/dL — ABNORMAL LOW (ref 8.9–10.3)
Chloride: 105 mmol/L (ref 98–111)
Creatinine, Ser: 0.61 mg/dL (ref 0.61–1.24)
GFR calc Af Amer: >60 mL/min (ref >60)
GFR calc non Af Amer: >60 mL/min (ref >60)
Glucose, Bld: 120 mg/dL — ABNORMAL HIGH (ref 70–99)
Potassium: 3.5 mmol/L (ref 3.5–5.1)
Sodium: 139 mmol/L (ref 135–145)

## 2020-04-17 LAB — CBC
HCT: 29.3 % — ABNORMAL LOW (ref 39.0–52.0)
Hemoglobin: 8.6 g/dL — ABNORMAL LOW (ref 13.0–17.0)
MCH: 23.9 pg — ABNORMAL LOW (ref 26.0–34.0)
MCHC: 29.4 g/dL — ABNORMAL LOW (ref 30.0–36.0)
MCV: 81.4 fL (ref 80.0–100.0)
Platelets: 277 10*3/uL (ref 150–400)
RBC: 3.6 MIL/uL — ABNORMAL LOW (ref 4.22–5.81)
RDW: 23.6 % — ABNORMAL HIGH (ref 11.5–15.5)
WBC: 4.9 10*3/uL (ref 4.0–10.5)
nRBC: 0.6 % — ABNORMAL HIGH (ref 0.0–0.2)

## 2020-04-17 LAB — CBG MONITORING, ED: Glucose-Capillary: 104 mg/dL — ABNORMAL HIGH (ref 70–99)

## 2020-04-17 LAB — SARS CORONAVIRUS 2 BY RT PCR (HOSPITAL ORDER, PERFORMED IN ~~LOC~~ HOSPITAL LAB): SARS Coronavirus 2: NEGATIVE

## 2020-04-17 LAB — GLUCOSE, CAPILLARY: Glucose-Capillary: 104 mg/dL — ABNORMAL HIGH (ref 70–99)

## 2020-04-17 MED ORDER — POTASSIUM CHLORIDE 2 MEQ/ML IV SOLN: INTRAVENOUS | Status: DC

## 2020-04-17 MED ORDER — ENOXAPARIN SODIUM 40 MG/0.4ML ~~LOC~~ SOLN
40.0000 mg | SUBCUTANEOUS | Status: DC
  Administered 2020-04-17 – 2020-04-19 (×3): 40 mg via SUBCUTANEOUS
  Filled 2020-04-17 (×3): qty 0.4

## 2020-04-17 MED ORDER — PROCHLORPERAZINE MALEATE 10 MG PO TABS
10.0000 mg | ORAL_TABLET | Freq: Four times a day (QID) | ORAL | Status: DC | PRN
  Filled 2020-04-17: qty 1

## 2020-04-17 MED ORDER — ASPIRIN EC 81 MG PO TBEC
81.0000 mg | DELAYED_RELEASE_TABLET | Freq: Every day | ORAL | Status: DC
  Administered 2020-04-17 – 2020-04-20 (×4): 81 mg via ORAL
  Filled 2020-04-17 (×4): qty 1

## 2020-04-17 MED ORDER — ACETAMINOPHEN 325 MG PO TABS
650.0000 mg | ORAL_TABLET | Freq: Once | ORAL | Status: AC
  Administered 2020-04-17: 650 mg via ORAL
  Filled 2020-04-17: qty 2

## 2020-04-17 MED ORDER — POTASSIUM CHLORIDE 2 MEQ/ML IV SOLN
INTRAVENOUS | Status: AC
  Filled 2020-04-17 (×3): qty 1000

## 2020-04-17 MED ORDER — CARVEDILOL 6.25 MG PO TABS
6.2500 mg | ORAL_TABLET | Freq: Two times a day (BID) | ORAL | Status: DC
  Administered 2020-04-17 – 2020-04-20 (×6): 6.25 mg via ORAL
  Filled 2020-04-17 (×3): qty 1
  Filled 2020-04-17: qty 2
  Filled 2020-04-17 (×2): qty 1

## 2020-04-17 MED ORDER — ENSURE ENLIVE PO LIQD
237.0000 mL | Freq: Two times a day (BID) | ORAL | Status: DC
  Administered 2020-04-20 (×2): 237 mL via ORAL

## 2020-04-17 MED ORDER — SUCRALFATE 1 GM/10ML PO SUSP
1.0000 g | Freq: Three times a day (TID) | ORAL | Status: DC
  Administered 2020-04-17 – 2020-04-20 (×11): 1 g via ORAL
  Filled 2020-04-17 (×12): qty 10

## 2020-04-17 MED ORDER — PANTOPRAZOLE SODIUM 40 MG PO TBEC
40.0000 mg | DELAYED_RELEASE_TABLET | Freq: Two times a day (BID) | ORAL | Status: DC
  Administered 2020-04-17 – 2020-04-20 (×6): 40 mg via ORAL
  Filled 2020-04-17 (×6): qty 1

## 2020-04-17 MED ORDER — INSULIN ASPART 100 UNIT/ML ~~LOC~~ SOLN
0.0000 [IU] | Freq: Three times a day (TID) | SUBCUTANEOUS | Status: DC
  Filled 2020-04-17: qty 0.06

## 2020-04-17 MED ORDER — BUDESONIDE 0.25 MG/2ML IN SUSP
0.2500 mg | Freq: Two times a day (BID) | RESPIRATORY_TRACT | Status: DC
  Administered 2020-04-17 – 2020-04-20 (×6): 0.25 mg via RESPIRATORY_TRACT
  Filled 2020-04-17 (×6): qty 2

## 2020-04-17 MED ORDER — ARFORMOTEROL TARTRATE 15 MCG/2ML IN NEBU
15.0000 ug | INHALATION_SOLUTION | Freq: Two times a day (BID) | RESPIRATORY_TRACT | Status: DC
  Administered 2020-04-17 – 2020-04-20 (×6): 15 ug via RESPIRATORY_TRACT
  Filled 2020-04-17 (×7): qty 2

## 2020-04-17 MED ORDER — ATORVASTATIN CALCIUM 40 MG PO TABS
80.0000 mg | ORAL_TABLET | Freq: Every day | ORAL | Status: DC
  Administered 2020-04-17 – 2020-04-20 (×4): 80 mg via ORAL
  Filled 2020-04-17 (×4): qty 2

## 2020-04-17 MED ORDER — FOLIC ACID 1 MG PO TABS
1.0000 mg | ORAL_TABLET | Freq: Every day | ORAL | Status: DC
  Administered 2020-04-18 – 2020-04-20 (×3): 1 mg via ORAL
  Filled 2020-04-17 (×3): qty 1

## 2020-04-17 MED ORDER — TRAMADOL HCL 50 MG PO TABS
50.0000 mg | ORAL_TABLET | Freq: Four times a day (QID) | ORAL | Status: DC | PRN
  Administered 2020-04-18 – 2020-04-19 (×2): 50 mg via ORAL
  Filled 2020-04-17 (×2): qty 1

## 2020-04-17 MED ORDER — DOCUSATE SODIUM 100 MG PO CAPS
200.0000 mg | ORAL_CAPSULE | Freq: Two times a day (BID) | ORAL | Status: DC
  Administered 2020-04-17 – 2020-04-20 (×3): 200 mg via ORAL
  Filled 2020-04-17 (×6): qty 2

## 2020-04-17 MED ORDER — CEFDINIR 300 MG PO CAPS
300.0000 mg | ORAL_CAPSULE | Freq: Two times a day (BID) | ORAL | Status: DC
  Administered 2020-04-17 – 2020-04-20 (×5): 300 mg via ORAL
  Filled 2020-04-17 (×7): qty 1

## 2020-04-17 MED ORDER — ADULT MULTIVITAMIN W/MINERALS CH
1.0000 | ORAL_TABLET | Freq: Every day | ORAL | Status: DC
  Administered 2020-04-18 – 2020-04-20 (×3): 1 via ORAL
  Filled 2020-04-17 (×3): qty 1

## 2020-04-17 NOTE — ED Triage Notes (Signed)
Pt arrived via EMS aox4, c/o increasing generalized weakness, was just discharged 9/18 admitted for PNA, worsening fatigue and inability to ambulate at home

## 2020-04-17 NOTE — H&P (Signed)
History and Physical  Scott Conley:[id number] DOB: [birth year] DOA: 04/17/2020  Referring physician: Margarita Mail, PA PCP: Golden Circle, FNP  Outpatient Specialists: Oncology Patient coming from:  Home  Chief Complaint: Generalized weakness   HPI: Scott Conley is a 79 y.o. male with medical history significant for recently diagnosed non-small cell lung cancer on chemotherapy and radiation, presented to [facility] ED due to generalized weakness and difficulty taking care of his ADLs.   States his legs felt so weak today that he had to cancel his radiation appointment.  Associated with poor appetite and poor oral intake.  Denies any fevers or chills.  He was discharged from [facility] on 04/16/2020 with home health services, was admitted for postobstructive pneumonia, currently on his last 3 days of oral antibiotics.  He lives at home with his daughter who works.  Will likely need SNF placement.  On presentation to the ED vital signs and lab studies are unremarkable.  UA pending at the time of this dictation.  Mildly dry mucous membranes on exam with concern for dehydration.  TRH asked to admit.  ED Course: Previous CT head done on 04/09/2020 did not show any evidence of acute intracranial abnormalities.  Review of Systems: Review of systems as noted in the HPI. All other systems reviewed and are negative.   Past Medical History:  Diagnosis Date  . Aortic atherosclerosis ([place])   . Arthritis   . Blood clot in vein   . CVA (cerebral infarction)   . Diabetes ([place])   . DVT (deep venous thrombosis) ([place])   . Emphysema lung ([place])   . Hypertension   . Lung cancer ([place])   . Osteoarthritis   . Stroke [facility])    Left hand stiffness    Past Surgical History:  Procedure Laterality Date  . APPENDECTOMY    . BIOPSY  03/05/2020   Procedure: BIOPSY;  Surgeon: Garner Nash, DO;  Location: [facility] ENDOSCOPY;  Service: Pulmonary;;  . BIOPSY  04/11/2020   Procedure: BIOPSY;  Surgeon: Irving Copas., MD;  Location: [facility] ENDOSCOPY;  Service: Gastroenterology;;  . Cydney Ok  03/05/2020   Procedure: CRYOTHERAPY;  Surgeon: Garner Nash, DO;  Location: [facility] ENDOSCOPY;  Service: Pulmonary;;  . CYST REMOVAL LEG     removed from right groin  . ESOPHAGOGASTRODUODENOSCOPY (EGD) WITH PROPOFOL N/A 04/11/2020   Procedure: ESOPHAGOGASTRODUODENOSCOPY (EGD) WITH PROPOFOL;  Surgeon: Rush Landmark Telford Nab., MD;  Location: [facility] ENDOSCOPY;  Service: Gastroenterology;  Laterality: N/A;  . HEMOSTASIS CONTROL  03/05/2020   Procedure: HEMOSTASIS CONTROL;  Surgeon: Garner Nash, DO;  Location: [facility] ENDOSCOPY;  Service: Pulmonary;;  . LOOP RECORDER IMPLANT  11/29/13   MDT LinQ implanted by Dr Caryl Comes for cryptogenic stroke  . LOOP RECORDER IMPLANT N/A 11/29/2013   Procedure: LOOP RECORDER IMPLANT;  Surgeon: Deboraha Sprang, MD;  Location: [facility] CATH LAB;  Service: Cardiovascular;  Laterality: N/A;  . TEE WITHOUT CARDIOVERSION N/A 11/29/2013   Procedure: TRANSESOPHAGEAL ECHOCARDIOGRAM (TEE);  Surgeon: Sueanne Margarita, MD;  Location: [facility] ENDOSCOPY;  Service: Cardiovascular;  Laterality: N/A;  . TONSILLECTOMY    . VIDEO BRONCHOSCOPY WITH ENDOBRONCHIAL ULTRASOUND N/A 03/05/2020   Procedure: VIDEO BRONCHOSCOPY WITH ENDOBRONCHIAL ULTRASOUND;  Surgeon: Garner Nash, DO;  Location: [place];  Service: Pulmonary;  Laterality: N/A;    Social History:  reports that he has quit smoking. His smoking use included cigarettes. He started smoking about 6 years ago. He has a 29.00 pack-year smoking history. He has never  used smokeless tobacco. He reports that he does not drink alcohol and does not use drugs.   No Known Allergies  Family History  Problem Relation Age of Onset  . Breast cancer Mother   . Breast cancer Maternal Grandmother   . Cirrhosis Brother       Prior to Admission medications   Medication Sig Start Date End Date Taking? Authorizing Provider  aspirin EC 81 MG tablet Take 1 tablet (81 mg total) by mouth  daily. Swallow whole. 03/06/20 03/06/21 Yes Robertlee Rogacki N, DO  atorvastatin (LIPITOR) 80 MG tablet Take 1 tablet (80 mg total) by mouth daily. Must keep appt w/new provider for future refills 03/05/20  Yes Irene Pap N, DO  carvedilol (COREG) 6.25 MG tablet Take 1 tablet (6.25 mg total) by mouth 2 (two) times daily with a meal. 03/05/20  Yes Kayleen Memos, DO  cefdinir (OMNICEF) 300 MG capsule Take 1 capsule (300 mg total) by mouth every 12 (twelve) hours for 3 days. 04/15/20 04/18/20 Yes Eugenie Filler, MD  docusate sodium (COLACE) 100 MG capsule Take 200 mg by mouth 2 (two) times daily.   Yes [provider]  feeding supplement, ENSURE ENLIVE, (ENSURE ENLIVE) LIQD Take 237 mLs by mouth 2 (two) times daily between meals. 04/16/20  Yes Eugenie Filler, MD  folic acid (FOLVITE) 1 MG tablet Take 1 tablet (1 mg total) by mouth daily. 04/16/20  Yes Eugenie Filler, MD  Multiple Vitamin (MULTIVITAMIN WITH MINERALS) TABS tablet Take 1 tablet by mouth daily. 04/16/20  Yes Eugenie Filler, MD  pantoprazole (PROTONIX) 40 MG tablet Take 1 tablet (40 mg total) by mouth 2 (two) times daily. 04/15/20  Yes Eugenie Filler, MD  prochlorperazine (COMPAZINE) 10 MG tablet Take 1 tablet (10 mg total) by mouth every 6 (six) hours as needed for nausea or vomiting. 03/20/20  Yes Curt Bears, MD  sucralfate (CARAFATE) 1 GM/10ML suspension Take 10 mLs (1 g total) by mouth 4 (four) times daily -  with meals and at bedtime. 04/15/20 05/15/20 Yes Eugenie Filler, MD  traMADol (ULTRAM) 50 MG tablet Take 1 tablet (50 mg total) by mouth every 6 (six) hours as needed for moderate pain. 04/15/20  Yes Eugenie Filler, MD  arformoterol (BROVANA) 15 MCG/2ML NEBU Take 2 mLs (15 mcg total) by nebulization 2 (two) times daily. 04/15/20   Eugenie Filler, MD  bisacodyl (DULCOLAX) 10 MG suppository Place 1 suppository (10 mg total) rectally daily as needed for severe constipation. Patient not taking: Reported on  03/20/2020 03/05/20   Kayleen Memos, DO  budesonide (PULMICORT) 0.25 MG/2ML nebulizer solution Take 2 mLs (0.25 mg total) by nebulization 2 (two) times daily. 04/15/20 07/14/20  Eugenie Filler, MD  polyethylene glycol (MIRALAX) 17 g packet Take 17 g by mouth daily as needed for moderate constipation. Patient not taking: Reported on 03/20/2020 03/05/20   Kayleen Memos, DO  senna (SENOKOT) 8.6 MG TABS tablet Take 2 tablets (17.2 mg total) by mouth daily as needed for moderate constipation. Patient not taking: Reported on 03/20/2020 03/05/20   Kayleen Memos, DO  sitaGLIPtin (JANUVIA) 100 MG tablet Take 1 tablet (100 mg total) by mouth daily. Must keep appt w/new provider for future refills Patient not taking: Reported on 04/17/2020 06/23/17   Biagio Borg, MD    Physical Exam: BP (!) 122/94 (BP Location: Right Arm)   Pulse 83   Temp 98.4 F (36.9 C) (Oral)   Resp  16   Ht 6\' 3"  (1.905 m)   Wt 89.7 kg   SpO2 99%   BMI 24.72 kg/m   . General: 79 y.o. year-old male well developed well nourished in no acute distress.  Alert and oriented x3. . Cardiovascular: Regular rate and rhythm with no rubs or gallops.  No thyromegaly or JVD noted.  Trace lower extremity edema bilaterally. Marland Kitchen Respiratory: Clear to auscultation with no wheezes or rales. Good inspiratory effort. . Abdomen: Soft nontender nondistended with normal bowel sounds x4 quadrants. . Muskuloskeletal: No cyanosis, clubbing or edema noted bilaterally . Neuro: CN II-XII intact, strength, sensation, reflexes . Skin: No ulcerative lesions noted or rashes . Psychiatry: Judgement and insight appear normal. Mood is appropriate for condition and setting          Labs on Admission:  Basic Metabolic Panel: Recent Labs  Lab 04/11/20 0525 04/11/20 0525 04/12/20 0512 04/13/20 0555 04/14/20 0540 04/15/20 0610 04/17/20 1330  NA 135   < > 137 136 136 138 139  K 3.5   < > 3.3* 3.1* 3.4* 3.0* 3.5  CL 106   < > 108 109 109 108 105  CO2 17*    < > 17* 18* 17* 19* 19*  GLUCOSE 101*   < > 126* 110* 105* 98 120*  BUN 10   < > 14 9 10 11 9   CREATININE 0.59*   < > 0.61 0.61 0.64 0.56* 0.61  CALCIUM 7.8*   < > 7.9* 7.8* 7.7* 8.0* 8.3*  MG 1.9  --  2.1 1.9  --  2.0  --    < > = values in this interval not displayed.   Liver Function Tests: No results for input(s): AST, ALT, ALKPHOS, BILITOT, PROT, ALBUMIN in the last 168 hours. No results for input(s): LIPASE, AMYLASE in the last 168 hours. No results for input(s): AMMONIA in the last 168 hours. CBC: Recent Labs  Lab 04/11/20 0525 04/11/20 0525 04/12/20 0512 04/13/20 0555 04/14/20 0540 04/15/20 0610 04/17/20 1330  WBC 2.2*   < > 2.5* 2.6* 2.9* 2.6* 4.9  NEUTROABS 1.4*  --  1.6* 1.7  --  1.5*  --   HGB 7.7*   < > 7.5* 7.5* 7.5* 7.7* 8.6*  HCT 26.0*   < > 25.3* 25.0* 25.8* 25.7* 29.3*  MCV 78.5*   < > 78.8* 78.6* 80.6 77.9* 81.4  PLT 203   < > 215 196 219 223 277   < > = values in this interval not displayed.   Cardiac Enzymes: No results for input(s): CKTOTAL, CKMB, CKMBINDEX, TROPONINI in the last 168 hours.  BNP (last 3 results) Recent Labs    04/07/20 2007  BNP 101.6*    ProBNP (last 3 results) No results for input(s): PROBNP in the last 8760 hours.  CBG: Recent Labs  Lab 04/14/20 1137 04/14/20 1653 04/14/20 2040 04/15/20 0725 04/15/20 1143  GLUCAP 107* 166* 97 94 114*    Radiological Exams on Admission: No results found.  EKG: I independently viewed the EKG done and my findings are as followed: None available at the time of this visit.  Assessment/Plan Present on Admission: **None**  Active Problems:   Generalized weakness   Generalized weakness, likely secondary to dehydration with poor appetite and poor oral intake Patient presented with generalized weakness after being discharged from the hospital the day prior to his presentation. Has not been able to take care of his ADLs, lives at home with his daughter who works. Mildly  dry mucous  membranes on exam with concern for dehydration.   May need placement, TOC consulted to assist with SNF placement. PT to assess Fall precautions Start gentle IV fluid hydration LR KCL 10meq at 75 cc/h. Monitor urine output Encourage to increase protein calorie intake  Acute dehydration/Mild hypokalemia Hypovolemic on exam Hemoconcentration on lab study Management as stated above.  Anemia of chronic disease in the setting of malignancy Continue to monitor H&H No overt bleeding Transfuse hemoglobin less than 7  Type 2 diabetes, well controlled  Hemoglobin A1c 6.6 on 03/04/20 Start insulin sliding scale.  Hyperlipidemia Resume home Lipitor  Hypertension BP is at goal Resume home antihypertensives  Recent postobstructive pneumonia Complete course of p.o. antibiotics  History of dysphagia likely secondary to severe esophagitis Continue home PPI Will need to follow-up with GI outpatient  Submucosal gastric papule Follow-up with GI outpatient.  Physical deconditioning PT to assess Fall precautions  Moderate protein calorie malnutrition Albumin 2.2 on 04/10/2020 Muscle mass loss BMI 24 Start oral supplement Encouraged to increase oral protein calorie intake    DVT prophylaxis: SQ Lovenox daily  Code Status: Full code as stated by the patient himself.   Family Communication: None at bedside   Disposition Plan: Admit to Med-surg   Consults called: None   Admission status: Observation status    Status is: Observation   Dispo:  Patient From: Home  Planned Disposition: [place]  Expected discharge date: 04/18/20  Medically stable for discharge: No, needs IV fluid   Kayleen Memos MD [facility] Pager [phone]  If 7PM-7AM, please contact night-coverage [email or web address] Password [email or web address]  04/17/2020, 4:07 PM

## 2020-04-17 NOTE — ED Provider Notes (Signed)
[facility] DEPT Provider Note   CSN: [id number] Arrival date & time: 04/17/20  1324     History Chief Complaint  Patient presents with   Weakness    Scott Conley is a 79 y.o. male who was discharged from the hospital yesterday after admission for postobstructive pneumonia.The patient was recently diagnosed with non-small cell lung carcinoma and is currently on chemotherapy and radiation.  He presented to the emergency department with complaint of shortness of breath on 04/08/2020.  Patient returns emergency department today stating that he is "too weak to walk."  Review of EMR shows that the patient did have a PT evaluation on the 18th and was recommended for home health.  Patient states that he was doing better while he was in the hospital but since that time has been too weak to walk.  He states that when he does try to walk with his walker he can only walk about 2-3 steps before he gets so weak that he has to stop.  He states "my legs will not hold me up."  He is living with his daughter who is not there with him all the time.  He states that he is so weak he has been urinating and defecating on himself in bed and his daughters had to help him.  He does have a history of previous CVA with left-sided hemiparesis but normally is able to ambulate with a walker.  He denies any new symptoms except states that his left arm is hurting.  Denies fevers, chills, cough.  The patient is a very poor historian and has difficulty expressing his complaints.  HPI     Past Medical History:  Diagnosis Date   Aortic atherosclerosis (HCC)    Arthritis    Blood clot in vein    CVA (cerebral infarction)    Diabetes ([place])    DVT (deep venous thrombosis) (HCC)    Emphysema lung (HCC)    Hypertension    Lung cancer (HCC)    Osteoarthritis    Stroke ([place])    Left hand stiffness     Patient Active Problem List   Diagnosis Date Noted   Dyspnea    History of  lung biopsy    Abnormal PET scan of colon    Erosive esophagitis    Esophageal dysphagia    Nodule of left lung    Controlled type 2 diabetes mellitus with hyperglycemia ([place]) 04/08/2020   Postobstructive pneumonia 04/07/2020   Goals of care, counseling/discussion 03/20/2020   Encounter for antineoplastic chemotherapy 03/20/2020   Malignant neoplasm of bronchus of right lower lobe (HCC)    Hilar mass    HLD (hyperlipidemia)    Hypokalemia    Hyponatremia    Microcytic anemia    Medicare annual wellness visit, subsequent 11/21/2016   Obesity 11/21/2016   Diabetes ([place]) 11/21/2015   Hypertension 11/21/2015   Low back strain 11/21/2015   Tobacco use 03/01/2014   Ataxia, late effect of cerebrovascular disease 01/18/2014   CVA (cerebral infarction) 11/30/2013   Stroke ([place]) 11/26/2013    Past Surgical History:  Procedure Laterality Date   APPENDECTOMY     BIOPSY  03/05/2020   Procedure: BIOPSY;  Surgeon: Garner Nash, DO;  Location: [facility];  Service: Pulmonary;;   BIOPSY  04/11/2020   Procedure: BIOPSY;  Surgeon: Irving Copas., MD;  Location: [facility];  Service: Gastroenterology;;   Cydney Ok  03/05/2020   Procedure: CRYOTHERAPY;  Surgeon: Garner Nash, DO;  Location: [facility];  Service: Pulmonary;;   CYST REMOVAL LEG     removed from right groin   ESOPHAGOGASTRODUODENOSCOPY (EGD) WITH PROPOFOL N/A 04/11/2020   Procedure: ESOPHAGOGASTRODUODENOSCOPY (EGD) WITH PROPOFOL;  Surgeon: Rush Landmark Telford Nab., MD;  Location: [facility];  Service: Gastroenterology;  Laterality: N/A;   HEMOSTASIS CONTROL  03/05/2020   Procedure: HEMOSTASIS CONTROL;  Surgeon: Garner Nash, DO;  Location: [facility];  Service: Pulmonary;;   LOOP RECORDER IMPLANT  11/29/13   MDT LinQ implanted by Dr Caryl Comes for cryptogenic stroke   LOOP RECORDER IMPLANT N/A 11/29/2013   Procedure: LOOP RECORDER IMPLANT;  Surgeon: Deboraha Sprang, MD;  Location: [facility]  CATH LAB;  Service: Cardiovascular;  Laterality: N/A;   TEE WITHOUT CARDIOVERSION N/A 11/29/2013   Procedure: TRANSESOPHAGEAL ECHOCARDIOGRAM (TEE);  Surgeon: Sueanne Margarita, MD;  Location: [facility];  Service: Cardiovascular;  Laterality: N/A;   TONSILLECTOMY     VIDEO BRONCHOSCOPY WITH ENDOBRONCHIAL ULTRASOUND N/A 03/05/2020   Procedure: VIDEO BRONCHOSCOPY WITH ENDOBRONCHIAL ULTRASOUND;  Surgeon: Garner Nash, DO;  Location: [facility];  Service: Pulmonary;  Laterality: N/A;       Family History  Problem Relation Age of Onset   Breast cancer Mother    Breast cancer Maternal Grandmother    Cirrhosis Brother     Social History   Tobacco Use   Smoking status: Former Smoker    Packs/day: 0.50    Years: 58.00    Pack years: 29.00    Types: Cigarettes    Start date: 11/30/2013   Smokeless tobacco: Never Used  Vaping Use   Vaping Use: Never used  Substance Use Topics   Alcohol use: No    Comment: none since 1985; hx alcohol abuse   Drug use: No    Home Medications Prior to Admission medications   Medication Sig Start Date End Date Taking? Authorizing Provider  arformoterol (BROVANA) 15 MCG/2ML NEBU Take 2 mLs (15 mcg total) by nebulization 2 (two) times daily. 04/15/20  Yes Eugenie Filler, MD  aspirin EC 81 MG tablet Take 1 tablet (81 mg total) by mouth daily. Swallow whole. 03/06/20 03/06/21 Yes Hall, Carole N, DO  atorvastatin (LIPITOR) 80 MG tablet Take 1 tablet (80 mg total) by mouth daily. Must keep appt w/new provider for future refills 03/05/20  Yes Irene Pap N, DO  budesonide (PULMICORT) 0.25 MG/2ML nebulizer solution Take 2 mLs (0.25 mg total) by nebulization 2 (two) times daily. 04/15/20 07/14/20 Yes Eugenie Filler, MD  carvedilol (COREG) 6.25 MG tablet Take 1 tablet (6.25 mg total) by mouth 2 (two) times daily with a meal. 03/05/20  Yes Hall, Carole N, DO  bisacodyl (DULCOLAX) 10 MG suppository Place 1 suppository (10 mg total) rectally daily as needed  for severe constipation. Patient not taking: Reported on 03/20/2020 03/05/20   Kayleen Memos, DO  cefdinir (OMNICEF) 300 MG capsule Take 1 capsule (300 mg total) by mouth every 12 (twelve) hours for 3 days. 04/15/20 04/18/20  Eugenie Filler, MD  docusate sodium (COLACE) 100 MG capsule Take 200 mg by mouth 2 (two) times daily.    [provider]  feeding supplement, ENSURE ENLIVE, (ENSURE ENLIVE) LIQD Take 237 mLs by mouth 2 (two) times daily between meals. 04/16/20   Eugenie Filler, MD  folic acid (FOLVITE) 1 MG tablet Take 1 tablet (1 mg total) by mouth daily. 04/16/20   Eugenie Filler, MD  Multiple Vitamin (MULTIVITAMIN WITH MINERALS) TABS tablet Take 1 tablet by  mouth daily. 04/16/20   Eugenie Filler, MD  pantoprazole (PROTONIX) 40 MG tablet Take 1 tablet (40 mg total) by mouth 2 (two) times daily. 04/15/20   Eugenie Filler, MD  polyethylene glycol (MIRALAX) 17 g packet Take 17 g by mouth daily as needed for moderate constipation. Patient not taking: Reported on 03/20/2020 03/05/20   Kayleen Memos, DO  prochlorperazine (COMPAZINE) 10 MG tablet Take 1 tablet (10 mg total) by mouth every 6 (six) hours as needed for nausea or vomiting. 03/20/20   Curt Bears, MD  senna (SENOKOT) 8.6 MG TABS tablet Take 2 tablets (17.2 mg total) by mouth daily as needed for moderate constipation. Patient not taking: Reported on 03/20/2020 03/05/20   Kayleen Memos, DO  sitaGLIPtin (JANUVIA) 100 MG tablet Take 1 tablet (100 mg total) by mouth daily. Must keep appt w/new provider for future refills 06/23/17   Biagio Borg, MD  sucralfate (CARAFATE) 1 g tablet Take 1 g by mouth 5 (five) times daily. 04/16/20   [provider]  sucralfate (CARAFATE) 1 GM/10ML suspension Take 10 mLs (1 g total) by mouth 4 (four) times daily -  with meals and at bedtime. 04/15/20 05/15/20  Eugenie Filler, MD  traMADol (ULTRAM) 50 MG tablet Take 1 tablet (50 mg total) by mouth every 6 (six) hours as needed for  moderate pain. 04/15/20   Eugenie Filler, MD    Allergies    Patient has no known allergies.  Review of Systems   Review of Systems Ten systems reviewed and are negative for acute change, except as noted in the HPI.   Physical Exam Updated Vital Signs BP (!) 122/94 (BP Location: Right Arm)    Pulse 83    Temp 98.4 F (36.9 C) (Oral)    Resp 16    Ht 6\' 3"  (1.905 m)    Wt 89.7 kg    SpO2 99%    BMI 24.72 kg/m   Physical Exam Vitals and nursing note reviewed.  Constitutional:      General: He is not in acute distress.    Appearance: He is well-developed. He is ill-appearing. He is not diaphoretic.  HENT:     Head: Normocephalic and atraumatic.  Eyes:     General: No scleral icterus.    Conjunctiva/sclera: Conjunctivae normal.  Cardiovascular:     Rate and Rhythm: Normal rate and regular rhythm.     Heart sounds: Normal heart sounds.  Pulmonary:     Effort: Pulmonary effort is normal. No respiratory distress.     Breath sounds: Normal breath sounds.  Abdominal:     Palpations: Abdomen is soft.     Tenderness: There is no abdominal tenderness.  Musculoskeletal:     Cervical back: Normal range of motion and neck supple.  Skin:    General: Skin is warm and dry.  Neurological:     Mental Status: He is alert.  Psychiatric:        Behavior: Behavior normal.     ED Results / Procedures / Treatments   Labs (all labs ordered are listed, but only abnormal results are displayed) Labs Reviewed - No data to display  EKG None  Radiology No results found.  Procedures Procedures (including critical care time)  Medications Ordered in ED Medications - No data to display  ED Course  I have reviewed the triage vital signs and the nursing notes.  Pertinent labs & imaging results that were available during my care of  the patient were reviewed by me and considered in my medical decision making (see chart for details).    MDM Rules/Calculators/A&P                            Patient here with weakness.  Unable to ambulate.  Discharged yesterday.  Will readmit for [facility]. Final Clinical Impression(s) / ED Diagnoses Final diagnoses:  None    Rx / DC Orders ED Discharge Orders    None       Margarita Mail, PA-C 04/17/20 Mechele Claude, MD 04/19/20 1312

## 2020-04-17 NOTE — TOC Initial Note (Addendum)
Transition of Care [facility]) - Initial/Assessment Note    Patient Details  Name: Scott Conley MRN: [id number] Date of Birth: 1941-03-07  [facility] CM/SW Contact:    Erenest Rasher, RN Phone Number: [phone] 04/17/2020, 5:25 PM  Clinical Narrative:                  TOC CM spoke to pt at bedside and agreeable to SNF rehab. States best to speak to his dtr, [name] or [name]. He is active with [facility] for [facility]. Gave permission to create FL2 and fax referral to SNF rehab. Spoke to dtr, and she is in agreement on SNF rehab. She states he lives alone. Waiting PT evaluation. Attending updated.       Expected Discharge Plan: Skilled Nursing Facility Barriers to Discharge: Continued Medical Work up   Patient Goals and CMS Choice Patient states their goals for this hospitalization and ongoing recovery are:: wants to go to rehab CMS Medicare.gov Compare Post Acute Care list provided to:: Patient Choice offered to / list presented to : Patient  Expected Discharge Plan and Services Expected Discharge Plan: [place] In-house Referral: Clinical Social Work Discharge Planning Services: CM Consult Post Acute Care Choice: Home Health, [place] Living arrangements for the past 2 months: Apartment                                      Prior Living Arrangements/Services Living arrangements for the past 2 months: Apartment Lives alone Patient language and need for interpreter reviewed:: Yes        Need for Family Participation in Patient Care: Yes (Comment) Care giver support system in place?: Yes (comment) Current home services: DME, Home RN, Home PT, Home OT (rolling walker, wheelchair) Criminal Activity/Legal Involvement Pertinent to Current Situation/Hospitalization: No - Comment as needed  Activities of Daily Living Home Assistive Devices/Equipment: Environmental consultant (specify type) ADL Screening (condition at time of admission) Patient's  cognitive ability adequate to safely complete daily activities?: Yes Is the patient deaf or have difficulty hearing?: No Does the patient have difficulty seeing, even when wearing glasses/contacts?: No Does the patient have difficulty concentrating, remembering, or making decisions?: No Patient able to express need for assistance with ADLs?: No Does the patient have difficulty dressing or bathing?: Yes Independently performs ADLs?: No Communication: Independent Dressing (OT): Needs assistance Is this a change from baseline?: Pre-admission baseline Grooming: Independent Feeding: Independent Bathing: Needs assistance Is this a change from baseline?: Pre-admission baseline Toileting: Needs assistance Is this a change from baseline?: Pre-admission baseline In/Out Bed: Needs assistance Is this a change from baseline?: Pre-admission baseline Walks in Home: Needs assistance Is this a change from baseline?: Pre-admission baseline Does the patient have difficulty walking or climbing stairs?: Yes (patient uses walker at home ) Weakness of Legs: Both Weakness of Arms/Hands: None  Permission Sought/Granted Permission sought to share information with : Case Manager, Facility Sport and exercise psychologist, PCP, Family Supports Permission granted to share information with : Yes, Verbal Permission Granted  Share Information with NAME: Yoshiaki Kreuser, [place] granted to share info w AGENCY: SNF rehab, [facility]  Permission granted to share info w Relationship: daughters  Permission granted to share info w Contact Information: [phone], [phone]  Emotional Assessment Appearance:: Appears stated age Attitude/Demeanor/Rapport: Engaged Affect (typically observed): Accepting Orientation: : Oriented to Self, Oriented to Place, Oriented to  Time, Oriented  to Situation   Psych Involvement: No (comment)  Admission diagnosis:  Generalized weakness [R53.1] Patient Active Problem List    Diagnosis Date Noted  . Generalized weakness 04/17/2020  . Dyspnea   . History of lung biopsy   . Abnormal PET scan of colon   . Erosive esophagitis   . Esophageal dysphagia   . Nodule of left lung   . Controlled type 2 diabetes mellitus with hyperglycemia ([place]) 04/08/2020  . Postobstructive pneumonia 04/07/2020  . Goals of care, counseling/discussion 03/20/2020  . Encounter for antineoplastic chemotherapy 03/20/2020  . Malignant neoplasm of bronchus of right lower lobe ([place])   . Hilar mass   . HLD (hyperlipidemia)   . Hypokalemia   . Hyponatremia   . Microcytic anemia   . Medicare annual wellness visit, subsequent 11/21/2016  . Obesity 11/21/2016  . Diabetes ([place]) 11/21/2015  . Hypertension 11/21/2015  . Low back strain 11/21/2015  . Tobacco use 03/01/2014  . Ataxia, late effect of cerebrovascular disease 01/18/2014  . CVA (cerebral infarction) 11/30/2013  . Stroke ([place]) 11/26/2013   PCP:  [place], FNP Pharmacy:   [facility] ([place]), [place] - [address] [place] [address] Phone: [phone] Fax: [phone]     Social Determinants of Health (SDOH) Interventions    Readmission Risk Interventions No flowsheet data found.

## 2020-04-18 ENCOUNTER — Other Ambulatory Visit: Payer: Self-pay

## 2020-04-18 ENCOUNTER — Ambulatory Visit
Admission: RE | Admit: 2020-04-18 | Discharge: 2020-04-18 | Disposition: A | Discharge status: Home / Self Care | Payer: Medicare HMO | Source: Ambulatory Visit | Attending: Radiation Oncology | Admitting: Radiation Oncology

## 2020-04-18 DIAGNOSIS — C3411 Malignant neoplasm of upper lobe, right bronchus or lung: Secondary | ICD-10-CM | POA: Diagnosis not present

## 2020-04-18 DIAGNOSIS — Z510 Encounter for antineoplastic radiation therapy: Secondary | ICD-10-CM | POA: Diagnosis not present

## 2020-04-18 DIAGNOSIS — R531 Weakness: Secondary | ICD-10-CM | POA: Diagnosis not present

## 2020-04-18 DIAGNOSIS — C3431 Malignant neoplasm of lower lobe, right bronchus or lung: Secondary | ICD-10-CM | POA: Diagnosis not present

## 2020-04-18 LAB — BASIC METABOLIC PANEL
Anion gap: 11 (ref 5–15)
BUN: 9 mg/dL (ref 8–23)
CO2: 21 mmol/L — ABNORMAL LOW (ref 22–32)
Calcium: 8.1 mg/dL — ABNORMAL LOW (ref 8.9–10.3)
Chloride: 108 mmol/L (ref 98–111)
Creatinine, Ser: 0.59 mg/dL — ABNORMAL LOW (ref 0.61–1.24)
GFR calc Af Amer: >60 mL/min (ref >60)
GFR calc non Af Amer: >60 mL/min (ref >60)
Glucose, Bld: 94 mg/dL (ref 70–99)
Potassium: 3.5 mmol/L (ref 3.5–5.1)
Sodium: 140 mmol/L (ref 135–145)

## 2020-04-18 LAB — CBC
HCT: 26.7 % — ABNORMAL LOW (ref 39.0–52.0)
Hemoglobin: 7.7 g/dL — ABNORMAL LOW (ref 13.0–17.0)
MCH: 23.3 pg — ABNORMAL LOW (ref 26.0–34.0)
MCHC: 28.8 g/dL — ABNORMAL LOW (ref 30.0–36.0)
MCV: 80.7 fL (ref 80.0–100.0)
Platelets: 248 10*3/uL (ref 150–400)
RBC: 3.31 MIL/uL — ABNORMAL LOW (ref 4.22–5.81)
RDW: 23.5 % — ABNORMAL HIGH (ref 11.5–15.5)
WBC: 3.7 10*3/uL — ABNORMAL LOW (ref 4.0–10.5)
nRBC: 0 % (ref 0.0–0.2)

## 2020-04-18 LAB — GLUCOSE, CAPILLARY
Glucose-Capillary: 80 mg/dL (ref 70–99)
Glucose-Capillary: 95 mg/dL (ref 70–99)
Glucose-Capillary: 80 mg/dL (ref 70–99)
Glucose-Capillary: 80 mg/dL (ref 70–99)

## 2020-04-18 LAB — MAGNESIUM: Magnesium: 2 mg/dL (ref 1.7–2.4)

## 2020-04-18 LAB — PHOSPHORUS: Phosphorus: 2.4 mg/dL — ABNORMAL LOW (ref 2.5–4.6)

## 2020-04-18 MED ORDER — TRAMADOL HCL 50 MG PO TABS
50.0000 mg | ORAL_TABLET | Freq: Two times a day (BID) | ORAL | 0 refills | Status: AC | PRN
Start: 2020-04-18 — End: 2020-04-21

## 2020-04-18 MED ORDER — POTASSIUM CHLORIDE 2 MEQ/ML IV SOLN
INTRAVENOUS | Status: DC
  Filled 2020-04-18 (×2): qty 1000

## 2020-04-18 MED ORDER — POTASSIUM CHLORIDE 2 MEQ/ML IV SOLN
INTRAVENOUS | Status: AC
  Filled 2020-04-18 (×2): qty 1000

## 2020-04-18 NOTE — Discharge Summary (Signed)
Discharge Summary  Scott Conley JHE:[id number] DOB: 05/23/1941  PCP: Golden Circle, FNP  Admit date: 04/17/2020 Discharge date: 04/18/2020  Time spent: 35 minutes   Recommendations for Outpatient Follow-up:  1. Follow-up with Tye Savoy, NP gastroenterology on 04/25/2020 at 11:30 AM as scheduled. 2. Follow-up with Dr. Lorna Few, oncology in 1 week or as previously scheduled. 3. Patient was discharged with home health therapies. 4. Follow-up with Golden Circle, FNP in 2 weeks.  On follow-up patient will need a basic metabolic profile done to follow-up on electrolytes and renal function.  5. Continue PT OT with assistance and fall precautions.  Discharge Diagnoses:  Active Hospital Problems   Diagnosis Date Noted  . Generalized weakness 04/17/2020    Resolved Hospital Problems  No resolved problems to display.    Discharge Condition: Stable   Diet recommendation: Soft diet.  Vitals:   04/18/20 0509 04/18/20 0741  BP: 126/73   Pulse: 87   Resp: 17   Temp: 97.7 F (36.5 C)   SpO2: 97% 98%    History of present illness:  Scott Conley is a 79 y.o. male with medical history significant for recently diagnosed non-small cell lung cancer on chemotherapy and radiation, presented to [facility] ED due to generalized weakness and difficulty taking care of his ADLs.   States his legs felt so weak today that he had to cancel his radiation appointment.  Associated with poor appetite and poor oral intake.  Denies any fevers or chills.  He was discharged from [facility] on 04/16/2020 with home health services, was admitted for postobstructive pneumonia, currently on his last 3 days of oral antibiotics.  He lives at home with his daughter who works.  Will likely need SNF placement.  On presentation to the ED vital signs and lab studies are unremarkable.  UA pending at the time of this dictation.  Mildly dry mucous membranes on exam with concern for dehydration.  TRH asked to  admit.  ED Course: Previous CT head done on 04/09/2020 did not show any evidence of acute intracranial abnormalities.  04/18/20:  Seen and examined.  No acute events overnight.  No new complaints.  Does not appear in distress.  Evaluated by PT with recommendation for SNF.  TOC consulted to assist with SNF placement.   Hospital Course:  Active Problems:   Generalized weakness  Generalized weakness, likely secondary to dehydration with poor appetite and poor oral intake Patient presented with generalized weakness after being discharged from the hospital the day prior to his presentation. Has not been able to take care of his ADLs, lives at home with his daughter who works. Mildly dry mucous membranes on exam with concern for dehydration.   Received IV fluid hydration Continue to encourage to increase oral protein calorie intake Continue oral supplement PT evaluated and recommended SNF TOC consulted to assist with SNF placement. Continue PT OT with assistance and fall precautions  Improving acute dehydration/Mild hypokalemia Mildly hypovolemic on exam Resolved hemoconcentration on lab study, hemoglobin 8.6 on presentation due to hemoconcentration, after IV fluid 7.7 Encourage oral intake.  Non-small cell lung cancer/left upper lobe nodule Patient noted to have been recently diagnosed with non-small cell lung cancer on chemotherapy and radiation treatment. Being followed by oncology in the outpatient setting. Patient currently full code. Per Dr. Starla Link patient thinks he might not be able to tolerate next round of chemo/radiation. This information was communicated with patient's oncologist Dr. Lorna Few via secure chat will follow up with  patient in the outpatient setting. Patient followed by IR and patient status post left upper lobe biopsy 04/12/2020 which was consistent with adenocarcinoma. Outpatient follow-up with oncology.   Anemia of chronic disease in the setting of  malignancy Continue to monitor H&H No overt bleeding at the time of this visit Keep your appointment with your hematologist oncologist.  Type 2 diabetes, well controlled  Hemoglobin A1c 6.6 on 03/04/20 Continue home regimen.  Hyperlipidemia Continue home Lipitor  Hypertension BP is at goal Continue home antihypertensives  Recent postobstructive pneumonia Complete your course of p.o. antibiotics  History of dysphagia likely secondary to severe esophagitis Continue home PPI Follow-up with GI outpatient as previously planned.  Submucosal gastric papule Follow-up with GI outpatient.  Physical deconditioning PT recommends SNF  Moderate protein calorie malnutrition Albumin 2.2 on 04/10/2020 Muscle mass loss BMI 24 Continue oral supplement Encouraged to increase oral protein calorie intake    DVT prophylaxis: SQ Lovenox daily  Code Status: Full code as stated by the patient himself.   Family Communication: None at bedside   Disposition Plan: Admit to Med-surg   Consults called: None    Discharge Exam: BP 126/73 (BP Location: Right Arm)   Pulse 87   Temp 97.7 F (36.5 C) (Oral)   Resp 17   Ht 6\' 3"  (1.905 m)   Wt 89.7 kg   SpO2 98%   BMI 24.72 kg/m  . General: 79 y.o. year-old male well developed well nourished in no acute distress.  Alert and interactive. . Cardiovascular: Regular rate and rhythm with no rubs or gallops.  No thyromegaly or JVD noted.   Marland Kitchen Respiratory: Clear to auscultation with no wheezes or rales. Good inspiratory effort. . Abdomen: Soft nontender nondistended with normal bowel sounds. . Musculoskeletal: No lower extremity edema bilaterally . Psychiatry: Mood is appropriate for condition and setting  Discharge Instructions You were cared for by a hospitalist during your hospital stay. If you have any questions about your discharge medications or the care you received while you were in the hospital after you are discharged, you  can call the unit and asked to speak with the hospitalist on call if the hospitalist that took care of you is not available. Once you are discharged, your primary care physician will handle any further medical issues. Please note that NO REFILLS for any discharge medications will be authorized once you are discharged, as it is imperative that you return to your primary care physician (or establish a relationship with a primary care physician if you do not have one) for your aftercare needs so that they can reassess your need for medications and monitor your lab values.   Allergies as of 04/18/2020   No Known Allergies     Medication List    STOP taking these medications   bisacodyl 10 MG suppository Commonly known as: Dulcolax   polyethylene glycol 17 g packet Commonly known as: MiraLax   senna 8.6 MG Tabs tablet Commonly known as: SENOKOT   sitaGLIPtin 100 MG tablet Commonly known as: Januvia     TAKE these medications   arformoterol 15 MCG/2ML Nebu Commonly known as: BROVANA Take 2 mLs (15 mcg total) by nebulization 2 (two) times daily.   aspirin EC 81 MG tablet Take 1 tablet (81 mg total) by mouth daily. Swallow whole.   atorvastatin 80 MG tablet Commonly known as: LIPITOR Take 1 tablet (80 mg total) by mouth daily. Must keep appt w/new provider for future refills   budesonide 0.25  MG/2ML nebulizer solution Commonly known as: PULMICORT Take 2 mLs (0.25 mg total) by nebulization 2 (two) times daily.   carvedilol 6.25 MG tablet Commonly known as: Coreg Take 1 tablet (6.25 mg total) by mouth 2 (two) times daily with a meal.   cefdinir 300 MG capsule Commonly known as: OMNICEF Take 1 capsule (300 mg total) by mouth every 12 (twelve) hours for 3 days.   docusate sodium 100 MG capsule Commonly known as: COLACE Take 200 mg by mouth 2 (two) times daily.   feeding supplement (ENSURE ENLIVE) Liqd Take 237 mLs by mouth 2 (two) times daily between meals.   folic acid 1 MG  tablet Commonly known as: FOLVITE Take 1 tablet (1 mg total) by mouth daily.   multivitamin with minerals Tabs tablet Take 1 tablet by mouth daily.   pantoprazole 40 MG tablet Commonly known as: PROTONIX Take 1 tablet (40 mg total) by mouth 2 (two) times daily.   prochlorperazine 10 MG tablet Commonly known as: COMPAZINE Take 1 tablet (10 mg total) by mouth every 6 (six) hours as needed for nausea or vomiting.   sucralfate 1 GM/10ML suspension Commonly known as: CARAFATE Take 10 mLs (1 g total) by mouth 4 (four) times daily -  with meals and at bedtime.   traMADol 50 MG tablet Commonly known as: ULTRAM Take 1 tablet (50 mg total) by mouth every 12 (twelve) hours as needed for up to 3 days for moderate pain. What changed: when to take this      No Known Allergies  Follow-up Information    Golden Circle, FNP. Call in 1 day(s).   Specialties: Family Medicine, Infectious Diseases Why: Please call for a post hospital follow up appointment Contact information: [place] [address] [phone]        Curt Bears, MD. Call in 1 day(s).   Specialty: Oncology Why: Please call for a post hospital follow up appointment. Contact information: [place] [address] [phone]        Willia Craze, NP. Call in 1 day(s).   Specialty: Gastroenterology Why: Please call for a post hospital follow up appointment. Contact information: [place] [address] [phone]                The results of significant diagnostics from this hospitalization (including imaging, microbiology, ancillary and laboratory) are listed below for reference.    Significant Diagnostic Studies: CT HEAD WO CONTRAST  Result Date: 04/09/2020 CLINICAL DATA:  Delirium.  Increased altered mental status. EXAM: CT HEAD WITHOUT CONTRAST TECHNIQUE: Contiguous axial images were obtained from the base of the skull through the vertex  without intravenous contrast. COMPARISON:  Brain MRI 03/22/2020, head CT 11/26/2013. FINDINGS: Brain: The examination is significantly motion degraded, limiting evaluation. Stable, moderate generalized parenchymal atrophy. Redemonstrated chronic cortically based infarcts within the right frontoparietal lobes. Stable, moderate ill-defined hypoattenuation within the cerebral white matter which is nonspecific, but consistent with chronic small vessel ischemic disease. No acute intracranial hemorrhage is identified. No acute demarcated cortical infarct is identified. No extra-axial fluid collection. No evidence of intracranial mass. No midline shift. Vascular: No hyperdense vessel.  Atherosclerotic calcifications Skull: Normal. Negative for fracture or focal lesion. Sinuses/Orbits: Visualized orbits show no acute finding. No significant paranasal sinus disease or mastoid effusion at the imaged levels. IMPRESSION: Significantly motion degraded examination, limiting evaluation. No acute intracranial abnormality is identified. Redemonstrated chronic cortically based infarcts within the right frontoparietal lobes.  Stable background moderate generalized parenchymal atrophy and cerebral white matter chronic small vessel ischemic disease. Electronically Signed   By: Kellie Simmering DO   On: 04/09/2020 15:18   CT Angio Chest PE W and/or Wo Contrast  Result Date: 04/07/2020 CLINICAL DATA:  Positive D-dimer. Suspected pulmonary embolus with low to intermediate probability. Diagnosis of stage III lung cancer with radiation today. EXAM: CT ANGIOGRAPHY CHEST WITH CONTRAST TECHNIQUE: Multidetector CT imaging of the chest was performed using the standard protocol during bolus administration of intravenous contrast. Multiplanar CT image reconstructions and MIPs were obtained to evaluate the vascular anatomy. CONTRAST:  179mL OMNIPAQUE IOHEXOL 350 MG/ML SOLN COMPARISON:  03/04/2020 FINDINGS: Cardiovascular: There is good opacification  of the central and segmental pulmonary arteries. No focal filling defects. No evidence of significant pulmonary embolus. Normal heart size. No pericardial effusions. Normal caliber thoracic aorta. Scattered aortic and coronary artery calcifications. Mediastinum/Nodes: Right hilar lymphadenopathy and right hilar mass as seen on previous studies. The mass measures about 6.6 x 6.5 cm in diameter. Small esophageal hiatal hernia. Esophagus is decompressed. Lungs/Pleura: Emphysematous changes in the lungs. Scarring and pleural thickening in the left apex. Nodular scarring with spiculation in the left apex measures about 11 mm in diameter. Neoplasm is not excluded. Appearance is unchanged since prior study. Right hilar mass as previously indicated.Postobstructive pneumonia and atelectasis in the right lower lung medially and in the right middle lung. Upper Abdomen: No acute process demonstrated in the visualized upper abdominal contents. Musculoskeletal: No destructive bone lesions. Review of the MIP images confirms the above findings. IMPRESSION: 1. No evidence of significant pulmonary embolus. 2. Right hilar lymphadenopathy and right hilar mass as seen on previous studies. Postobstructive pneumonia and atelectasis in the right lower lung medially and in the right middle lung. 3. Nodular scarring with spiculation in the left apex measures about 11 mm in diameter. Neoplasm is not excluded. 4. Emphysematous changes in the lungs. 5. Small esophageal hiatal hernia. 6. Emphysema and aortic atherosclerosis. Aortic Atherosclerosis (ICD10-I70.0) and Emphysema (ICD10-J43.9). Electronically Signed   By: Lucienne Capers M.D.   On: 04/07/2020 23:19   MR Brain W Wo Contrast  Result Date: 03/22/2020 CLINICAL DATA:  Staging non-small cell lung cancer. EXAM: MRI HEAD WITHOUT AND WITH CONTRAST TECHNIQUE: Multiplanar, multiecho pulse sequences of the brain and surrounding structures were obtained without and with intravenous contrast.  CONTRAST:  44mL GADAVIST GADOBUTROL 1 MMOL/ML IV SOLN COMPARISON:  11/27/2013 FINDINGS: Brain: Diffusion imaging does not show any acute or subacute infarction. No focal abnormality affects the brainstem or cerebellum. Cerebral hemispheres show generalized atrophy with chronic small-vessel ischemic change of the hemispheric white matter. Old right frontoparietal vertex cortical and subcortical infarction. No evidence of primary or metastatic mass lesion, hemorrhage, hydrocephalus or extra-axial collection. Vascular: Major vessels at the base of the brain show flow. Skull and upper cervical spine: Negative Sinuses/Orbits: Clear/normal Other: None IMPRESSION: 1. No evidence of metastatic disease. 2. Atrophy and chronic small-vessel ischemic changes as outlined above. Old right frontoparietal vertex cortical and subcortical infarction. Electronically Signed   By: Nelson Chimes M.D.   On: 03/22/2020 21:25   NM PET Image Initial (PI) Skull Base To Thigh  Addendum Date: 03/22/2020   ADDENDUM REPORT: 03/22/2020 14:10 ADDENDUM: As noted in the body of the report, there is hypermetabolism in the distal esophagus. This may be related to esophagitis, but consider upper endoscopy to exclude neoplasm. Electronically Signed   By: Misty Stanley M.D.   On: 03/22/2020 14:10  Result Date: 03/22/2020 CLINICAL DATA:  Initial treatment strategy for non-small-cell lung cancer. EXAM: NUCLEAR MEDICINE PET SKULL BASE TO THIGH TECHNIQUE: 10.4 mCi F-18 FDG was injected intravenously. Full-ring PET imaging was performed from the skull base to thigh after the radiotracer. CT data was obtained and used for attenuation correction and anatomic localization. Fasting blood glucose: 127 mg/dl COMPARISON:  Insert CT angio chest 03/04/2020 FINDINGS: Mediastinal blood pool activity: SUV max 3.3 Liver activity: SUV max NA NECK: Asymmetric uptake noted right inferior parotid gland without a discrete mass lesion evident. No lymphadenopathy in this  region. Incidental CT findings: none CHEST: Right parahilar mass is hypermetabolic with central necrosis. SUV max = 23.3. Hypermetabolic activity extends into the right hilum. No hypermetabolic mediastinal or left hilar lymphadenopathy. Focal nodular pleural thickening in the posterior left lung apex (image 55/4). Nodular component to this thickening measures 1.6 x 1.4 cm. SUV max = 5.1 Hypermetabolism is identified in the distal esophagus. Small to moderate hiatal hernia noted. Incidental CT findings: Coronary artery calcification is evident. Atherosclerotic calcification is noted in the wall of the thoracic aorta. Centrilobular emphsyema noted. ABDOMEN/PELVIS: No abnormal hypermetabolic activity within the liver, pancreas, adrenal glands, or spleen. No hypermetabolic lymph nodes in the abdomen or pelvis. There is marked focal hypermetabolism in the region of the ileocecal valve, potentially with mild intussusception in the terminal ileum. Although this is a noncontrast study, there is a suggestion of a soft tissue lesion in the cecum (see axial 158/4). As there are other areas of focal hypermetabolism in the colon, this may be physiologic. Incidental CT findings: There is abdominal aortic atherosclerosis without aneurysm. Right common iliac artery is aneurysmal up to 3.9 cm diameter. Left common iliac artery at the bifurcation is 1.7 cm diameter. Advanced diverticular disease noted in the left colon. SKELETON: No focal hypermetabolic activity to suggest skeletal metastasis. Incidental CT findings: none IMPRESSION: 1. Right parahilar mass lesion is markedly hypermetabolic consistent with known malignancy. 2. Hypermetabolic nodular pleural thickening posterior left lung apex. This could represent metastatic disease or a synchronous primary. 3. Marked focal hypermetabolism in the right colon, in the region the ileocecal valve. Noncontrast CT imaging for attenuation correction raises concern for a soft tissue lesion  at this location. CT abdomen/pelvis with oral and intravenous contrast would likely prove helpful to further evaluate. There may be an associated short segment intussusception of the terminal ileum. 4. Right common iliac artery aneurysm measuring up to 3.9 cm diameter. Interventional Radiology consultation recommended. 5. Asymmetric uptake inferior right parotid region without discrete mass lesion or lymphadenopathy evident. CT neck with contrast may prove helpful to assess for lesion not visible on noncontrast CT imaging today. 6.  Emphysema. (ICD10-J43.9) 7.  Aortic Atherosclerois (ICD10-170.0) Electronically Signed: By: Misty Stanley M.D. On: 03/22/2020 13:39   CT BIOPSY  Result Date: 04/12/2020 INDICATION: 79 year old male with a history of right-sided lung cancer and a new hypermetabolic pleural based nodule in the periphery of the left lung apex. It is unclear if this represents a metastasis or a second primary lung malignancy. He presents for CT-guided biopsy to facilitate tissue diagnosis. EXAM: CT-guided biopsy left upper lobe pulmonary nodule Interventional Radiologist:  Criselda Peaches, MD MEDICATIONS: None. ANESTHESIA/SEDATION: Fentanyl 100 mcg IV; Versed 2 mg IV Moderate Sedation Time:  10 minutes The patient was continuously monitored during the procedure by the interventional radiology nurse under my direct supervision. FLUOROSCOPY TIME:  None. COMPLICATIONS: None immediate. Estimated blood loss:  0 PROCEDURE: Informed written consent was  obtained from the patient after a thorough discussion of the procedural risks, benefits and alternatives. All questions were addressed. Maximal Sterile Barrier Technique was utilized including caps, mask, sterile gowns, sterile gloves, sterile drape, hand hygiene and skin antiseptic. A timeout was performed prior to the initiation of the procedure. A planning axial CT scan was performed. The nodule in the left upper lobe was successfully identified. A  suitable skin entry site was selected and marked. The region was then sterilely prepped and draped in standard fashion using Betadine skin prep. Local anesthesia was attained by infiltration with 1% lidocaine. A small dermatotomy was made. Under intermittent CT fluoroscopic guidance, a 17 gauge trocar needle was advanced into the lung and positioned at the margin of the nodule. Multiple 18 gauge core biopsies were then coaxially obtained using the BioPince automated biopsy device. Biopsy specimens were placed in formalin and delivered to pathology for further analysis. The biopsy device and introducer needle were removed. Post biopsy axial CT imaging demonstrates no evidence of immediate complication. There is no pneumothorax. Mild perilesional alveolar hemorrhage is not unexpected. The patient tolerated the procedure well. IMPRESSION: Technically successful CT-guided biopsy left upper lobe pulmonary nodule. Electronically Signed   By: Jacqulynn Cadet M.D.   On: 04/12/2020 14:06   DG Chest Port 1 View  Result Date: 04/12/2020 CLINICAL DATA:  Status post lung biopsy. EXAM: PORTABLE CHEST 1 VIEW COMPARISON:  April 07, 2020. FINDINGS: Heart size is within normal limits. Stable appearance of right infrahilar mass. No pneumothorax or pleural effusion is noted. The visualized skeletal structures are unremarkable. IMPRESSION: Stable appearance of right infrahilar mass. No other abnormality seen in the chest. Electronically Signed   By: Marijo Conception M.D.   On: 04/12/2020 13:54   DG Chest Port 1 View  Result Date: 04/07/2020 CLINICAL DATA:  Stage III right lower lobe non-small cell lung cancer, dyspnea, cough EXAM: PORTABLE CHEST 1 VIEW COMPARISON:  03/04/2020 FINDINGS: 2 frontal views of the chest demonstrate right infrahilar mass unchanged since prior study. No new consolidation, effusion, or pneumothorax. Cardiac silhouette is stable. Loop recorder unchanged. IMPRESSION: 1. Stable right infrahilar mass.  2. No acute airspace disease. Electronically Signed   By: Randa Ngo M.D.   On: 04/07/2020 20:51    Microbiology: Recent Results (from the past 240 hour(s))  SARS Coronavirus 2 by RT PCR (hospital order, performed in [facility] hospital lab) Nasopharyngeal Nasopharyngeal Swab     Status: None   Collection Time: 04/17/20  3:13 PM   Specimen: Nasopharyngeal Swab  Result Value Ref Range Status   SARS Coronavirus 2 NEGATIVE NEGATIVE Final    Comment: (NOTE) SARS-CoV-2 target nucleic acids are NOT DETECTED.  The SARS-CoV-2 RNA is generally detectable in upper and lower respiratory specimens during the acute phase of infection. The lowest concentration of SARS-CoV-2 viral copies this assay can detect is 250 copies / mL. A negative result does not preclude SARS-CoV-2 infection and should not be used as the sole basis for treatment or other patient management decisions.  A negative result may occur with improper specimen collection / handling, submission of specimen other than nasopharyngeal swab, presence of viral mutation(s) within the areas targeted by this assay, and inadequate number of viral copies (<250 copies / mL). A negative result must be combined with clinical observations, patient history, and epidemiological information.  Fact Sheet for Patients:   StrictlyIdeas.no  Fact Sheet for Healthcare Providers: BankingDealers.co.za  This test is not yet approved or  cleared by  the [employer] and has been authorized for detection and/or diagnosis of SARS-CoV-2 by FDA under an Emergency Use Authorization (EUA).  This EUA will remain in effect (meaning this test can be used) for the duration of the COVID-19 declaration under Section 564(b)(1) of the Act, 21 U.S.C. section 360bbb-3(b)(1), unless the authorization is terminated or revoked sooner.  Performed at [facility], [place] [address] [place],  [place] [address]      Labs: Basic Metabolic Panel: Recent Labs  Lab 04/12/20 0512 04/12/20 0512 04/13/20 0555 04/14/20 0540 04/15/20 0610 04/17/20 1330 04/18/20 0330  NA 137   < > 136 136 138 139 140  K 3.3*   < > 3.1* 3.4* 3.0* 3.5 3.5  CL 108   < > 109 109 108 105 108  CO2 17*   < > 18* 17* 19* 19* 21*  GLUCOSE 126*   < > 110* 105* 98 120* 94  BUN 14   < > 9 10 11 9 9   CREATININE 0.61   < > 0.61 0.64 0.56* 0.61 0.59*  CALCIUM 7.9*   < > 7.8* 7.7* 8.0* 8.3* 8.1*  MG 2.1  --  1.9  --  2.0  --  2.0  PHOS  --   --   --   --   --   --  2.4*   < > = values in this interval not displayed.   Liver Function Tests: No results for input(s): AST, ALT, ALKPHOS, BILITOT, PROT, ALBUMIN in the last 168 hours. No results for input(s): LIPASE, AMYLASE in the last 168 hours. No results for input(s): AMMONIA in the last 168 hours. CBC: Recent Labs  Lab 04/12/20 0512 04/12/20 0512 04/13/20 0555 04/14/20 0540 04/15/20 0610 04/17/20 1330 04/18/20 0330  WBC 2.5*   < > 2.6* 2.9* 2.6* 4.9 3.7*  NEUTROABS 1.6*  --  1.7  --  1.5*  --   --   HGB 7.5*   < > 7.5* 7.5* 7.7* 8.6* 7.7*  HCT 25.3*   < > 25.0* 25.8* 25.7* 29.3* 26.7*  MCV 78.8*   < > 78.6* 80.6 77.9* 81.4 80.7  PLT 215   < > 196 219 223 277 248   < > = values in this interval not displayed.   Cardiac Enzymes: No results for input(s): CKTOTAL, CKMB, CKMBINDEX, TROPONINI in the last 168 hours. BNP: BNP (last 3 results) Recent Labs    04/07/20 2007  BNP 101.6*    ProBNP (last 3 results) No results for input(s): PROBNP in the last 8760 hours.  CBG: Recent Labs  Lab 04/15/20 1143 04/17/20 1753 04/17/20 2105 04/18/20 0759 04/18/20 1138  GLUCAP 114* 104* 104* 80 95       Signed:  Kayleen Memos, MD [facility] 04/18/2020, 2:18 PM

## 2020-04-18 NOTE — Discharge Instructions (Addendum)
Dehydration, Adult Dehydration is condition in which there is not enough water or other fluids in the body. This happens when a person loses more fluids than he or she takes in. Important body parts cannot work right without the right amount of fluids. Any loss of fluids from the body can cause dehydration. Dehydration can be mild, worse, or very bad. It should be treated right away to keep it from getting very bad. What are the causes? This condition may be caused by:  Conditions that cause loss of water or other fluids, such as: ? Watery poop (diarrhea). ? Vomiting. ? Sweating a lot. ? Peeing (urinating) a lot.  Not drinking enough fluids, especially when you: ? Are ill. ? Are doing things that take a lot of energy to do.  Other illnesses and conditions, such as fever or infection.  Certain medicines, such as medicines that take extra fluid out of the body (diuretics).  Lack of safe drinking water.  Not being able to get enough water and food. What increases the risk? The following factors may make you more likely to develop this condition:  Having a long-term (chronic) illness that has not been treated the right way, such as: ? Diabetes. ? Heart disease. ? Kidney disease.  Being 65 years of age or older.  Having a disability.  Living in a place that is high above the ground or sea (high in altitude). The thinner, dried air causes more fluid loss.  Doing exercises that put stress on your body for a long time. What are the signs or symptoms? Symptoms of dehydration depend on how bad it is. Mild or worse dehydration  Thirst.  Dry lips or dry mouth.  Feeling dizzy or light-headed, especially when you stand up from sitting.  Muscle cramps.  Your body making: ? Dark pee (urine). Pee may be the color of tea. ? Less pee than normal. ? Less tears than normal.  Headache. Very bad dehydration  Changes in skin. Skin may: ? Be cold to the touch (clammy). ? Be blotchy  or pale. ? Not go back to normal right after you lightly pinch it and let it go.  Little or no tears, pee, or sweat.  Changes in vital signs, such as: ? Fast breathing. ? Low blood pressure. ? Weak pulse. ? Pulse that is more than 100 beats a minute when you are sitting still.  Other changes, such as: ? Feeling very thirsty. ? Eyes that look hollow (sunken). ? Cold hands and feet. ? Being mixed up (confused). ? Being very tired (lethargic) or having trouble waking from sleep. ? Short-term weight loss. ? Loss of consciousness. How is this treated? Treatment for this condition depends on how bad it is. Treatment should start right away. Do not wait until your condition gets very bad. Very bad dehydration is an emergency. You will need to go to a hospital.  Mild or worse dehydration can be treated at home. You may be asked to: ? Drink more fluids. ? Drink an oral rehydration solution (ORS). This drink helps get the right amounts of fluids and salts and minerals in the blood (electrolytes).  Very bad dehydration can be treated: ? With fluids through an IV tube. ? By getting normal levels of salts and minerals in your blood. This is often done by giving salts and minerals through a tube. The tube is passed through your nose and into your stomach. ? By treating the root cause. Follow these instructions at   home: Oral rehydration solution If told by your doctor, drink an ORS:  Make an ORS. Use instructions on the package.  Start by drinking small amounts, about  cup (120 mL) every 5-10 minutes.  Slowly drink more until you have had the amount that your doctor said to have. Eating and drinking         Drink enough clear fluid to keep your pee pale yellow. If you were told to drink an ORS, finish the ORS first. Then, start slowly drinking other clear fluids. Drink fluids such as: ? Water. Do not drink only water. Doing that can make the salt (sodium) level in your body get too  low. ? Water from ice chips you suck on. ? Fruit juice that you have added water to (diluted). ? Low-calorie sports drinks.  Eat foods that have the right amounts of salts and minerals, such as: ? Bananas. ? Oranges. ? Potatoes. ? Tomatoes. ? Spinach.  Do not drink alcohol.  Avoid: ? Drinks that have a lot of sugar. These include:  High-calorie sports drinks.  Fruit juice that you did not add water to.  Soda.  Caffeine. ? Foods that are greasy or have a lot of fat or sugar. General instructions  Take over-the-counter and prescription medicines only as told by your doctor.  Do not take salt tablets. Doing that can make the salt level in your body get too high.  Return to your normal activities as told by your doctor. Ask your doctor what activities are safe for you.  Keep all follow-up visits as told by your doctor. This is important. Contact a doctor if:  You have pain in your belly (abdomen) and the pain: ? Gets worse. ? Stays in one place.  You have a rash.  You have a stiff neck.  You get angry or annoyed (irritable) more easily than normal.  You are more tired or have a harder time waking than normal.  You feel: ? Weak or dizzy. ? Very thirsty. Get help right away if you have:  Any symptoms of very bad dehydration.  Symptoms of vomiting, such as: ? You cannot eat or drink without vomiting. ? Your vomiting gets worse or does not go away. ? Your vomit has blood or green stuff in it.  Symptoms that get worse with treatment.  A fever.  A very bad headache.  Problems with peeing or pooping (having a bowel movement), such as: ? Watery poop that gets worse or does not go away. ? Blood in your poop (stool). This may cause poop to look black and tarry. ? Not peeing in 6-8 hours. ? Peeing only a small amount of very dark pee in 6-8 hours.  Trouble breathing. These symptoms may be an emergency. Do not wait to see if the symptoms will go away. Get  medical help right away. Call your local emergency services (911 in the [place] Do not drive yourself to the hospital. Summary  Dehydration is a condition in which there is not enough water or other fluids in the body. This happens when a person loses more fluids than he or she takes in.  Treatment for this condition depends on how bad it is. Treatment should be started right away. Do not wait until your condition gets very bad.  Drink enough clear fluid to keep your pee pale yellow. If you were told to drink an oral rehydration solution (ORS), finish the ORS first. Then, start slowly drinking other clear fluids.  Take over-the-counter and prescription medicines only as told by your doctor.  Get help right away if you have any symptoms of very bad dehydration. This information is not intended to replace advice given to you by your health care provider. Make sure you discuss any questions you have with your health care provider. Document Revised: 02/25/2019 Document Reviewed: 02/25/2019 Elsevier Patient Education  [place] refers to the changes in the body that occur during a period of inactivity. The changes happen in the heart, lungs, and muscles. They make you feel tired and weak (fatigued) and decrease your ability to be active. The three stages of deconditioning include:  Mild deconditioning. This is a change in your ability to do your usual exercise activities, such as running, biking, or swimming.  Moderate deconditioning. This is a change in your ability to do normal everyday activities, such as walking, shopping for groceries, and doing chores.  Severe deconditioning. In this stage, you may not be able to do minimal activity or usual self-care. What are the causes? Deconditioning can occur after only a few days of inactivity. The longer the period of inactivity, the more severe the deconditioning will be, and the longer it will take to return to  your previous level of functioning. Deconditioning is often caused by inactivity due to:  Illnesses, such as cancer, stroke, heart attack, fibromyalgia, and chronic fatigue syndrome.  Injuries, especially back injuries, broken bones, and injuries to soft tissues, such as ligaments and tendons.  A long stay in the hospital.  Pregnancy, especially if long periods of bed rest are needed. What increases the risk? The following factors may make you more likely to develop this condition:  Staying in the hospital or being on bed rest.  Obesity.  Poor nutrition.  Being an older adult.  Having an injury or illness that affects your movement and activity. What are the signs or symptoms? Symptoms of this condition include:  Weakness and tiredness.  Shortness of breath with minor physical effort (exertion).  A heartbeat that is faster than normal. You may not notice this without taking your pulse.  Pain or discomfort with activity.  Decreased strength, endurance, and balance.  Difficulty doing your usual forms of exercise.  Difficulty doing activities of daily living, such as grocery shopping or chores. You may also have problems walking around the house and doing basic self-care, such as getting to the bathroom, preparing meals, or doing laundry. How is this diagnosed? This condition is diagnosed based on your medical history and a physical exam. During the physical exam, your health care provider will check for signs of deconditioning, such as:  Decreased size of muscles.  Decreased strength.  Trouble with balance.  Shortness of breath or a heart rate that is faster than normal after minor exertion. How is this treated? Treatment for this condition involves an exercise program in which activity is increased slowly. Your health care provider will tell you which exercises are right for you. The exercise program will likely include:  Aerobic exercise. This type of exercise helps  improve the functioning of the heart, lungs, and muscles.  Strength training. This type of exercise helps increase muscle size and strength. Both of these types of exercise will improve your endurance. You may be referred to a physical therapist who can create a safe strengthening program for you to follow. Follow these instructions at home: Eating and drinking   Eat a healthy, well-balanced diet. This includes: ? Proteins, such as  lean meats and fish, to build muscles. ? Fresh fruits and vegetables. ? Carbohydrates, such as whole grains, to boost energy.  Drink enough fluid to keep your urine pale yellow. Activity   Follow the exercise program that is recommended by your health care provider or physical therapist.  Do not increase your exercise any faster than directed. General instructions  Take over-the-counter and prescription medicines only as told by your health care provider.  Do not use any products that contain nicotine or tobacco, such as cigarettes, e-cigarettes, and chewing tobacco. If you need help quitting, ask your health care provider.  Keep all follow-up visits as told by your health care provider. This is important. Contact a health care provider if:  You are not able to do the recommended exercise program.  You are becoming more and more tired and weak.  You become light-headed when rising to a sitting or standing position.  Your level of endurance decreases after it has improved. Get help right away if you:  Have chest pain.  Are very short of breath.  Have any episodes of fainting. Summary  Deconditioning refers to the changes in the body that occur during a period of inactivity.  Deconditioning happens in the heart, lungs, and muscles. The changes make you feel tired and weak and decrease your ability to be active.  Treatment for deconditioning involves an exercise program in which activity is increased slowly. This information is not intended  to replace advice given to you by your health care provider. Make sure you discuss any questions you have with your health care provider. Document Revised: 12/10/2018 Document Reviewed: 12/10/2018 Elsevier Patient Education  [place] Cancer-Related Fatigue Fatigue is the most common side effect of cancer and treatments for cancer such as chemotherapy and radiation. It can make you feel tired, weak, and drained of energy even after a small amount of activity. Unlike the fatigue that healthy people feel, cancer-related fatigue may not go away with sleep or rest. Fatigue is typically worse during treatment, but survivors may continue to feel some fatigue after treatment ends. There are several things you can do to help manage your fatigue. What can I do to help prevent cancer-related fatigue? Cancer treatment, low blood counts, appetite changes, and changes in sleep routines can all cause fatigue. Some ways to reduce or prevent fatigue include:  Eating healthy and drinking enough fluid to stay hydrated. Talk with a nutritionist or dietitian who can help you plan the best diet for you.  Staying active and getting regular exercise. Check with your cancer care team first to make sure this is safe.  Getting plenty of rest and taking it easy. Try taking a few short naps or rest breaks throughout the day. Tell your health care provider about any cancer-related side effects. He or she may be able to adjust your medicines or treatment to reduce your fatigue. Follow these instructions at home: Lifestyle   Exercise regularly. ? Aim for 20 minutes of moderate exercise 4-5 times a week, or 60 minutes 3 times a week. ? Examples of moderate exercise include walking, biking, or swimming. Ask your health care provider what activities are safe for you. ? Remember to pace yourself and take short rest breaks.  Find an activity or hobby that you enjoy, and spend 20-30 minutes on it a few  times a week. This can help improve your mood, your focus, and your overall mental health.  Get plenty of rest and  keep a regular sleep routine. ? Go to bed at the same time every night. ? Only use your bed for sleep. ? Avoid noise during sleep.  Take short naps or rest breaks during the day. Do not nap for more than 20 minutes.  Try to lower your stress. If you need help with this, talk with your health care provider about ways to manage stress. Some things you can try include: ? Doing mind and body exercises such as meditation, yoga, tai chi, or qigong. ? Writing in a journal. ? Making a to-do list and prioritizing only the important tasks. Eating and drinking   Eat a healthy diet that includes fresh fruits and vegetables, lean proteins, healthy fats, low-fat dairy products, and whole grains.  Avoid simple, refined sugars, such as soft drinks and sweets.  Try eating small meals and snacks throughout the day. A piece of fruit and some nuts are good choices for snacking.  Try to drink at least 8 glasses of water every day.  Limit the amount of alcohol and caffeine that you drink. Medicines  Talk with your health care provider about prescription medicines and over-the-counter vitamins and supplements that you take. Where to find support Your health care provider or cancer care team will be able to give you advice about how to cope with fatigue. They can also guide you in making health and lifestyle changes that may help you feel less tired. Your health care provider may also refer you to a therapist for counseling. Talking with a therapist may help you reduce fatigue by working through any stresses or fears that you may have. Where to find more information There are a number of trusted print and online resources that can help you learn more about cancer and its side effects. Ask your health care provider what he or she recommends. You can start with:  The American Cancer Society:  www.cancer.org  The American Society of Clinical Oncology: www.cancer.net  The [employer]: www.cancer.gov Contact a health care provider if:  You feel depressed.  Your fatigue gets worse or does not go away with sleep or rest.  You are unable to sleep at night.  You become confused or you are not able to think clearly.  You feel short of breath, feel dizzy, or have a racing heartbeat after little activity.  You are thinking about changing your medicines or treatment.  You would like to make changes to your diet or are thinking about taking dietary supplements. Summary  Fatigue is the most common side effect of cancer treatments such as chemotherapy and radiation.  Unlike the fatigue that healthy people feel, cancer-related fatigue may not go away with sleep and rest alone.  There are several things you can do to help manage your fatigue, including eating a healthy diet, getting exercise, and keeping a regular sleep routine.  Your health care provider or cancer care team will be able to give you advice about how to cope with fatigue. They can also guide you in making health and lifestyle changes that may help you feel less tired. This information is not intended to replace advice given to you by your health care provider. Make sure you discuss any questions you have with your health care provider. Document Revised: 06/27/2017 Document Reviewed: 03/11/2016 Elsevier Patient Education  2020 Elsevier Inc.   Weakness Weakness is a lack of strength. You may feel weak all over your body (generalized), or you may feel weak in one part of  your body (focal). There are many potential causes of weakness. Sometimes, the cause of your weakness may not be known. Some causes of weakness can be serious, so it is important to see your doctor. Follow these instructions at home: Activity  Rest as needed.  Try to get enough sleep. Most adults need 7-8 hours of sleep each night.  Talk to your doctor about how much sleep you need each night.  Do exercises, such as arm curls and leg raises, for 30 minutes at least 2 days a week or as told by your doctor.  Think about working with a physical therapist or trainer to help you get stronger. General instructions   Take over-the-counter and prescription medicines only as told by your doctor.  Eat a healthy, well-balanced diet. This includes: ? Proteins to build muscles, such as lean meats and fish. ? Fresh fruits and vegetables. ? Carbohydrates to boost energy, such as whole grains.  Drink enough fluid to keep your pee (urine) pale yellow.  Keep all follow-up visits as told by your doctor. This is important. Contact a doctor if:  Your weakness does not get better or it gets worse.  Your weakness affects your ability to: ? Think clearly. ? Do your normal daily activities. Get help right away if you:  Have sudden weakness on one side of your face or body.  Have chest pain.  Have trouble breathing or shortness of breath.  Have problems with your vision.  Have trouble talking or swallowing.  Have trouble standing or walking.  Are light-headed.  Pass out (lose consciousness). Summary  Weakness is a lack of strength. You may feel weak all over your body or just in one part of your body.  There are many potential causes of weakness. Sometimes, the cause of your weakness may not be known.  Rest as needed, and try to get enough sleep. Most adults need 7-8 hours of sleep each night.  Eat a healthy, well-balanced diet. This information is not intended to replace advice given to you by your health care provider. Make sure you discuss any questions you have with your health care provider. Document Revised: 02/18/2018 Document Reviewed: 02/18/2018 Elsevier Patient Education  [place].

## 2020-04-18 NOTE — Progress Notes (Signed)
Pt returned from radiation treatment in stable condition.

## 2020-04-18 NOTE — Patient Outreach (Signed)
[facility]) Care Management  04/18/2020  Scott Conley Apr 15, 1941 [id number]   Referral Date: 04/18/20 Referral Source: [employer] Report Date of Discharge: 04/15/20 Facility:  [facility]: [facility]   Referral received.  No outreach warranted at this time.  Patient currently hospitalized with pending SNF placement.  Plan: RN CM will close case.    Jone Baseman, RN, MSN [facility] Care Management Care Management Coordinator Direct Line [phone] Toll Free: [phone]  Fax: [phone]

## 2020-04-18 NOTE — Evaluation (Signed)
Physical Therapy Evaluation Patient Details Name: Scott Conley MRN: [id number] DOB: 02/23/1941 Today's Date: 04/18/2020   History of Present Illness  Patient is a 79 y.o. male with PMH significant for recently diagnosed non-small cell lung cancer on CXR. Pt presented to [facility] ED 04/17/20 due to generalized weakness and difficulty taking care of his ADLs. Pt with recent hospitalization for postobstructive pneumonia from 9/10-9/19; and was discharged with [facility] services. PMH includes CVA with left residual weakness, HTN and DM.    Clinical Impression  Scott Conley is 79 y.o. male admitted with above HPI and diagnosis. Patient is currently limited by functional impairments below (see PT problem list). Patient lives alone and is independent with occasional use of RW at baseline. He has been having increased difficulty completing ADL's and mobilizing due to weakness. Patient currentyl requires Min Assist for bed mobility and transfers with RW. He declined to ambulate in room or out in hall and was focused on returning to bed for radiation today. Patient requires encouragement to mobilize and time spent educating on benefits of functional activity to improve strength and activity tolerance. Patient will benefit from continued skilled PT interventions to address impairments and progress independence with mobility, recommending SNF follow up for rehab. Acute PT will follow and progress as able.     Follow Up Recommendations SNF    Equipment Recommendations  None recommended by PT    Recommendations for Other Services       Precautions / Restrictions Precautions Precautions: Fall Restrictions Weight Bearing Restrictions: No      Mobility  Bed Mobility Overal bed mobility: Needs Assistance Bed Mobility: Sit to Supine;Supine to Sit     Supine to sit: Min assist;HOB elevated Sit to supine: Min assist   General bed mobility comments: Min assist for LE's off EOB, and cues to use bed rail to  raise trunk. Assist to fully raise trunk upright and cues to scoot forward to place feet on floor. Min assist to raise LE's into bed.   Transfers Overall transfer level: Needs assistance Equipment used: Rolling walker (2 wheeled) Transfers: Sit to/from Stand Sit to Stand: Min guard;From elevated surface         General transfer comment: VC's for safety with RW, min guard for safety with rise, no assist from elevated surface.   Ambulation/Gait Ambulation/Gait assistance: Min assist Gait Distance (Feet): 4 Feet Assistive device: Rolling walker (2 wheeled) Gait Pattern/deviations: Step-to pattern;Decreased stride length Gait velocity: decr   General Gait Details: VC's for safety with side stepping EOB, min assist to steady and to manage walker. Pt continues to state "I can't" throughout mobility.  Stairs            Wheelchair Mobility    Modified Rankin (Stroke Patients Only)       Balance Overall balance assessment: Needs assistance Sitting-balance support: No upper extremity supported;Feet supported Sitting balance-Leahy Scale: Good     Standing balance support: Bilateral upper extremity supported Standing balance-Leahy Scale: Poor                Pertinent Vitals/Pain Pain Assessment: Faces Faces Pain Scale: No hurt Pain Intervention(s): Monitored during session;Premedicated before session (premedicated for arm pain)    Home Living Family/patient expects to be discharged to:: Skilled nursing facility Living Arrangements: Alone Available Help at Discharge: Family;Available PRN/intermittently Type of Home: Apartment Home Access: Level entry     Home Layout: One level Home Equipment: Walker - 2 wheels      Prior  Function Level of Independence: Independent with assistive device(s)         Comments: Uses RW for ambulation. No falls. Drives. Daughter brings food and gets groceries delivered.     Hand Dominance   Dominant Hand: Right     Extremity/Trunk Assessment   Upper Extremity Assessment Upper Extremity Assessment: Generalized weakness    Lower Extremity Assessment Lower Extremity Assessment: Generalized weakness    Cervical / Trunk Assessment Cervical / Trunk Assessment: Kyphotic  Communication   Communication: No difficulties  Cognition Arousal/Alertness: Awake/alert Behavior During Therapy: WFL for tasks assessed/performed Overall Cognitive Status: Within Functional Limits for tasks assessed          General Comments: pt repeatedly stating "I can't do it" to all tasks for functional mobility. Pt requires MAX encouragement to participate an perseverates on time of radiation appointment today.      General Comments      Exercises     Assessment/Plan    PT Assessment Patient needs continued PT services  PT Problem List Decreased strength;Decreased activity tolerance;Decreased balance;Decreased mobility;Decreased knowledge of use of DME       PT Treatment Interventions DME instruction;Gait training;Functional mobility training;Therapeutic activities;Therapeutic exercise;Patient/family education;Balance training    PT Goals (Current goals can be found in the Care Plan section)  Acute Rehab PT Goals Patient Stated Goal: none stated by patient PT Goal Formulation: With patient Time For Goal Achievement: 05/02/20 Potential to Achieve Goals: Good    Frequency Min 3X/week   Barriers to discharge           AM-PAC PT "6 Clicks" Mobility  Outcome Measure Help needed turning from your back to your side while in a flat bed without using bedrails?: A Little Help needed moving from lying on your back to sitting on the side of a flat bed without using bedrails?: A Little Help needed moving to and from a bed to a chair (including a wheelchair)?: A Little Help needed standing up from a chair using your arms (e.g., wheelchair or bedside chair)?: A Little Help needed to walk in hospital room?: A  Little Help needed climbing 3-5 steps with a railing? : A Lot 6 Click Score: 17    End of Session Equipment Utilized During Treatment: Gait belt Activity Tolerance: Other (comment) (pt self limiting "I can't do it" despite mobilizing. ) Patient left: in bed;with call bell/phone within reach;with bed alarm set Nurse Communication: Mobility status PT Visit Diagnosis: Unsteadiness on feet (R26.81);Difficulty in walking, not elsewhere classified (R26.2);Muscle weakness (generalized) (M62.81)    Time: 8144-8185 PT Time Calculation (min) (ACUTE ONLY): 18 min   Charges:   PT Evaluation $PT Eval Moderate Complexity: 1 Mod        Verner Mould, DPT Acute Rehabilitation Services  Office [phone] Pager [phone]  04/18/2020 10:30 AM

## 2020-04-18 NOTE — TOC Progression Note (Addendum)
Transition of Care [facility]) - Progression Note    Patient Details  Name: Scott Conley MRN: [id number] Date of Birth: 1941/02/07  [facility]) CM/SW Contact  Lennart Pall, LCSW Phone Number: 04/18/2020, 1:09 PM  Clinical Narrative:    Met patient this morning and have spoken with daughter, Jeannene Patella, to confirm plan for SNF.  Both in agreement and daughter adds that family is prepared to provide pt's transportation from facility for daily radiation txs and weekly chemo.  Both hopeful this will be short term rehab for pt.  Bed search underway.  1400 addendum:  Have now spoken with pt's other daughter, [name], who is aware and agreeable with plan.  Expected Discharge Plan: [place] Barriers to Discharge: Continued Medical Work up  Expected Discharge Plan and Services Expected Discharge Plan: [place] In-house Referral: Clinical Social Work Discharge Planning Services: CM Consult Post Acute Care Choice: [place], [place] Living arrangements for the past 2 months: Apartment Expected Discharge Date: 04/18/20                                     Social Determinants of Health (SDOH) Interventions    Readmission Risk Interventions No flowsheet data found.

## 2020-04-18 NOTE — NC FL2 (Signed)
Scott Conley MEDICAID FL2 LEVEL OF CARE SCREENING TOOL     IDENTIFICATION  Patient Name: Scott Conley Birthdate: [birth year] Sex: male Admission Date (Current Location): 04/17/2020  [facility] and [place] Number:  Herbalist and Address:  [facility],  [place] Scott Conley, Scott Conley      Provider Number: [id number]  Attending Physician Name and Address:  Kayleen Memos, DO  Relative Name and Phone Number:  daughter, [name] @ [phone]    Current Level of Care: Hospital Recommended Level of Care: [place] Prior Approval Number:    Date Approved/Denied:   PASRR Number: [id number]  Discharge Plan: SNF    Current Diagnoses: Patient Active Problem List   Diagnosis Date Noted  . Generalized weakness 04/17/2020  . Dyspnea   . History of lung biopsy   . Abnormal PET scan of colon   . Erosive esophagitis   . Esophageal dysphagia   . Nodule of left lung   . Controlled type 2 diabetes mellitus with hyperglycemia ([place]) 04/08/2020  . Postobstructive pneumonia 04/07/2020  . Goals of care, counseling/discussion 03/20/2020  . Encounter for antineoplastic chemotherapy 03/20/2020  . Malignant neoplasm of bronchus of right lower lobe ([place])   . Hilar mass   . HLD (hyperlipidemia)   . Hypokalemia   . Hyponatremia   . Microcytic anemia   . Medicare annual wellness visit, subsequent 11/21/2016  . Obesity 11/21/2016  . Diabetes ([place]) 11/21/2015  . Hypertension 11/21/2015  . Low back strain 11/21/2015  . Tobacco use 03/01/2014  . Ataxia, late effect of cerebrovascular disease 01/18/2014  . CVA (cerebral infarction) 11/30/2013  . Stroke ([place]) 11/26/2013    Orientation RESPIRATION BLADDER Height & Weight     Self, Time, Situation, Place  Normal Incontinent Weight: 197 lb 12 oz (89.7 kg) Height:  6\' 3"  (190.5 cm)  BEHAVIORAL SYMPTOMS/MOOD NEUROLOGICAL BOWEL NUTRITION STATUS      Continent    AMBULATORY STATUS COMMUNICATION OF  NEEDS Skin   Limited Assist Verbally Normal                       Personal Care Assistance Level of Assistance  Bathing, Dressing Bathing Assistance: Limited assistance   Dressing Assistance: Limited assistance     Functional Limitations Info             SPECIAL CARE FACTORS FREQUENCY  PT (By licensed PT), OT (By licensed OT)     PT Frequency: 5x/wk OT Frequency: 5x/wk            Contractures Contractures Info: Not present    Additional Factors Info  Code Status, Allergies Code Status Info: Full Allergies Info: NKDA           Current Medications (04/18/2020):  This is the current hospital active medication list Current Facility-Administered Medications  Medication Dose Route Frequency Provider Last Rate Last Admin  . arformoterol (BROVANA) nebulizer solution 15 mcg  15 mcg Nebulization BID Irene Pap N, DO   15 mcg at 04/18/20 0741  . aspirin EC tablet 81 mg  81 mg Oral Daily Irene Pap N, DO   81 mg at 04/18/20 0949  . atorvastatin (LIPITOR) tablet 80 mg  80 mg Oral Daily Irene Pap N, DO   80 mg at 04/18/20 0949  . budesonide (PULMICORT) nebulizer solution 0.25 mg  0.25 mg Nebulization BID Irene Pap N, DO   0.25 mg at 04/18/20 0746  . carvedilol (COREG) tablet 6.25  mg  6.25 mg Oral BID WC Irene Pap N, DO   6.25 mg at 04/18/20 1655  . cefdinir (OMNICEF) capsule 300 mg  300 mg Oral Q12H Hall, Carole N, DO   300 mg at 04/18/20 0949  . docusate sodium (COLACE) capsule 200 mg  200 mg Oral BID Irene Pap N, DO   200 mg at 04/17/20 2318  . enoxaparin (LOVENOX) injection 40 mg  40 mg Subcutaneous Q24H Irene Pap N, DO   40 mg at 04/17/20 2319  . feeding supplement (ENSURE ENLIVE) (ENSURE ENLIVE) liquid 237 mL  237 mL Oral BID BM Hall, Carole N, DO      . folic acid (FOLVITE) tablet 1 mg  1 mg Oral Daily [place], Carole N, DO   1 mg at 04/18/20 0950  . insulin aspart (novoLOG) injection 0-6 Units  0-6 Units Subcutaneous TID WC Hall, Carole N, DO      .  lactated ringers 1,000 mL with potassium chloride 20 mEq infusion   Intravenous Continuous Kayleen Memos, DO 75 mL/hr at 04/17/20 1812 New Bag at 04/17/20 1812  . multivitamin with minerals tablet 1 tablet  1 tablet Oral Daily Irene Pap N, DO   1 tablet at 04/18/20 0949  . pantoprazole (PROTONIX) EC tablet 40 mg  40 mg Oral BID Irene Pap N, DO   40 mg at 04/18/20 0950  . prochlorperazine (COMPAZINE) tablet 10 mg  10 mg Oral Q6H PRN Irene Pap N, DO      . sucralfate (CARAFATE) 1 GM/10ML suspension 1 g  1 g Oral TID WC & HS Hall, Carole N, DO   1 g at 04/18/20 0811  . traMADol (ULTRAM) tablet 50 mg  50 mg Oral Q6H PRN Irene Pap N, DO   50 mg at 04/18/20 3748     Discharge Medications: Please see discharge summary for a list of discharge medications.  Relevant Imaging Results:  Relevant Lab Results:   Additional Information SS# [id number]  Lennart Pall, LCSW

## 2020-04-19 ENCOUNTER — Ambulatory Visit
Admission: RE | Admit: 2020-04-19 | Discharge: 2020-04-19 | Disposition: A | Discharge status: Home / Self Care | Payer: Medicare HMO | Source: Ambulatory Visit | Attending: Radiation Oncology | Admitting: Radiation Oncology

## 2020-04-19 ENCOUNTER — Observation Stay: Payer: Medicare HMO

## 2020-04-19 DIAGNOSIS — R531 Weakness: Secondary | ICD-10-CM

## 2020-04-19 DIAGNOSIS — Z23 Encounter for immunization: Secondary | ICD-10-CM

## 2020-04-19 DIAGNOSIS — Z510 Encounter for antineoplastic radiation therapy: Secondary | ICD-10-CM | POA: Diagnosis not present

## 2020-04-19 DIAGNOSIS — C3411 Malignant neoplasm of upper lobe, right bronchus or lung: Secondary | ICD-10-CM | POA: Diagnosis not present

## 2020-04-19 DIAGNOSIS — C3431 Malignant neoplasm of lower lobe, right bronchus or lung: Secondary | ICD-10-CM | POA: Diagnosis not present

## 2020-04-19 LAB — URINALYSIS, ROUTINE W REFLEX MICROSCOPIC
Bacteria, UA: NONE SEEN
Bilirubin Urine: NEGATIVE
Glucose, UA: NEGATIVE mg/dL
Hgb urine dipstick: NEGATIVE
Ketones, ur: 20 mg/dL — AB
Nitrite: NEGATIVE
Protein, ur: 30 mg/dL — AB
Specific Gravity, Urine: 1.023 (ref 1.005–1.030)
pH: 5 (ref 5.0–8.0)

## 2020-04-19 LAB — GLUCOSE, CAPILLARY
Glucose-Capillary: 73 mg/dL (ref 70–99)
Glucose-Capillary: 98 mg/dL (ref 70–99)
Glucose-Capillary: 110 mg/dL — ABNORMAL HIGH (ref 70–99)
Glucose-Capillary: 98 mg/dL (ref 70–99)

## 2020-04-19 MED ORDER — FENTANYL CITRATE (PF) 100 MCG/2ML IJ SOLN
12.5000 ug | Freq: Once | INTRAMUSCULAR | Status: AC
  Administered 2020-04-20: 12.5 ug via INTRAVENOUS
  Filled 2020-04-19: qty 2

## 2020-04-19 NOTE — Progress Notes (Signed)
Occupational Therapy Evaluation  Patient with functional deficits listed below impacting safety and independence with self care. Patient min A for functional transfers and ambulation to bathroom with cues for safety with rolling walker as patient pushes too far forward. Patient with decreased standing tolerance requiring mod A for hygiene thoroughness after bowel movement and min A standing sink side for hand hygiene with patient stating "I'm going to fall." Reassure patient and provide min A for safety to ambulate to recliner chair. Recommend continued acute OT services for ADL and transfer training and activity tolerance to facilitate D/C to venue listed below.    04/19/20 1400  OT Visit Information  Last OT Received On 04/19/20  Assistance Needed +1  History of Present Illness Patient is a 79 y.o. male with PMH significant for recently diagnosed non-small cell lung cancer on CXR. Pt [facility] to [facility] ED 04/17/20 due to generalized weakness and difficulty taking care of his ADLs. Pt with recent hospitalization for postobstructive pneumonia from 9/10-9/19; and was discharged with [facility] services. PMH includes CVA with left residual weakness, HTN and DM.  Precautions  Precautions Fall  Restrictions  Weight Bearing Restrictions No  Home Living  Family/patient expects to be discharged to: [place] Alone  Available Help at Discharge Family;Available PRN/intermittently  Type of Home Apartment  Home Access Level entry  Home Layout One level  Bathroom Shower/Tub Tub/shower unit  Corporate treasurer Yes  Home Equipment Walker - 2 wheels  Prior Function  Level of Independence Independent with assistive device(s)  Comments Uses RW for ambulation. No falls. Drives. Daughter brings food and gets groceries delivered.  Communication  Communication No difficulties  Pain Assessment  Pain Assessment No/denies pain  Cognition   Arousal/Alertness Awake/alert  Behavior During Therapy WFL for tasks assessed/performed  Overall Cognitive Status Within Functional Limits for tasks assessed  Upper Extremity Assessment  Upper Extremity Assessment Generalized weakness  Lower Extremity Assessment  Lower Extremity Assessment Defer to PT evaluation  Cervical / Trunk Assessment  Cervical / Trunk Assessment Kyphotic  ADL  Overall ADL's  Needs assistance/impaired  Grooming Minimal assistance;Wash/dry hands;Standing  Grooming Details (indicate cue type and reason) pt stating "I'm going to fall" min A for safety however no loss of balance noted   Upper Body Bathing Set up;Sitting  Lower Body Bathing Moderate assistance;Sitting/lateral leans;Sit to/from stand  Upper Body Dressing  Set up;Sitting  Lower Body Dressing Moderate assistance;Sitting/lateral leans;Sit to/from Retail buyer Minimal assistance;Ambulation;RW (BSC over toilet)  Toilet Transfer Details (indicate cue type and reason) cues for safety, patient pushing walker too far forward  Toileting- Clothing Manipulation and Hygiene Moderate assistance;Sit to/from stand  Toileting - Clothing Manipulation Details (indicate cue type and reason) patient fatigue with prolonged standing for perianal care after bowel movement requiring mod A for thoroughness   Functional mobility during ADLs Minimal assistance;Cueing for safety;Cueing for sequencing;Rolling walker  General ADL Comments patient with decreased activity tolerance, strength, safety, balance, impacting independence with self care  Bed Mobility  Overal bed mobility Needs Assistance  Bed Mobility Supine to Sit  Supine to sit Min guard;HOB elevated  General bed mobility comments min G for safety managing LEs OOB   Transfers  Overall transfer level Needs assistance  Equipment used Rolling walker (2 wheeled)  Transfers Sit to/from Stand  Sit to Stand Min assist  General transfer comment cues for safety with  walker as patient pushes very far forward  Balance  Overall balance  assessment Needs assistance  Sitting-balance support Feet supported  Sitting balance-Leahy Scale Fair  Standing balance support Bilateral upper extremity supported  Standing balance-Leahy Scale Poor  OT - End of Session  Equipment Utilized During Treatment Rolling walker  Activity Tolerance Patient tolerated treatment well  Patient left in chair;with call bell/phone within reach;with chair alarm set  Nurse Communication Mobility status  OT Assessment  OT Recommendation/Assessment Patient needs continued OT Services  OT Visit Diagnosis Other abnormalities of gait and mobility (R26.89);Muscle weakness (generalized) (M62.81)  OT Problem List Decreased strength;Decreased activity tolerance;Impaired balance (sitting and/or standing);Decreased safety awareness  OT Plan  OT Frequency (ACUTE ONLY) Min 2X/week  OT Treatment/Interventions (ACUTE ONLY) Self-care/ADL training;Therapeutic exercise;Energy conservation;DME and/or AE instruction;Therapeutic activities;Patient/family education;Balance training  AM-PAC OT "6 Clicks" Daily Activity Outcome Measure (Version 2)  Help from another person eating meals? 4  Help from another person taking care of personal grooming? 3  Help from another person toileting, which includes using toliet, bedpan, or urinal? 2  Help from another person bathing (including washing, rinsing, drying)? 2  Help from another person to put on and taking off regular upper body clothing? 3  Help from another person to put on and taking off regular lower body clothing? 2  6 Click Score 16  OT Recommendation  Follow Up Recommendations SNF  OT Equipment Other (comment) (defer to next venue)  Individuals Consulted  Consulted and Agree with Results and Recommendations Patient  Acute Rehab OT Goals  Patient Stated Goal "I'm weak"  OT Goal Formulation With patient  Time For Goal Achievement 05/03/20  Potential  to Achieve Goals Good  OT Time Calculation  OT Start Time (ACUTE ONLY) 1255  OT Stop Time (ACUTE ONLY) 1314  OT Time Calculation (min) 19 min  OT General Charges  $OT Visit 1 Visit  OT Evaluation  $OT Eval Low Complexity 1 Low  Written Expression  Dominant Hand Right   Delbert Phenix OT OT pager: [phone]

## 2020-04-19 NOTE — Progress Notes (Signed)
PROGRESS NOTE    Scott Conley  DGL:[id number] DOB: 14-Feb-1941 DOA: 04/17/2020 PCP: [facility], FNP    Chief Complaint  Patient presents with  . Weakness    Brief Narrative:   79 year old male prior history of recently diagnosed non-small lung cancer on chemotherapy and radiation presents to ED for generalized weakness and difficulty taking care of his ADLs.  CT of the head done did not show any acute intracranial abnormalities.  He was admitted to [facility] for evaluation of generalized weakness probably secondary to dehydration. PT OT evaluation recommending SNF.  Assessment & Plan:   Active Problems:   Generalized weakness  Lateralized weakness probably secondary to dehydration with poor appetite and poor oral intake Dry mucous membranes on exam with concern for dehydration on admission.  Gently hydrated with IV fluids and increased oral intake.  PT evaluation recommending SNF.  TOC on board and plan for discharge to SNF tomorrow.    Mild hypokalemia Replaced.    Non-small cell lung cancer Follows up with oncologist Dr. Julien Nordmann as outpatient.    Anemia of chronic disease in the setting of malignancy Transfuse to keep hemoglobin greater than 7.   Essential hypertension Well-controlled at this time.    Type 2 diabetes mellitus Well-controlled Hemoglobin A1c is 6.6.    Hyperlipidemia Continue with the Lipitor.    History of dysphagia secondary to severe esophagitis. Continue with PPI and recommend outpatient follow-up with GI.    Submucosal gastric papule Recommend outpatient follow-up with GI.    Moderate protein calorie malnutrition Dietary on board and recommend continue with oral supplementation and encouraged to increase oral protein intake.       DVT prophylaxis: (Lovenox) Code Status: (Fullcode  Family Communication: none at bedside.  Disposition:   Status is: Observation    Dispo:  Patient From: Home  Planned  Disposition: [place]  Expected discharge date: 04/20/20  Medically stable for discharge: yes   Consultants:   None.   Procedures: none.   Antimicrobials: none.    Subjective: No new complaints.   Objective: Vitals:   04/18/20 2036 04/19/20 0621 04/19/20 0850 04/19/20 1344  BP: 132/64 139/75  98/69  Pulse: 74 79  96  Resp: 18 18    Temp: 98 F (36.7 C) 97.6 F (36.4 C)  97.9 F (36.6 C)  TempSrc: Oral Oral    SpO2: 98% 100% 94% 100%  Weight:      Height:        Intake/Output Summary (Last 24 hours) at 04/19/2020 1722 Last data filed at 04/19/2020 1700 Gross per 24 hour  Intake 2337.97 ml  Output 1675 ml  Net 662.97 ml   Filed Weights   04/17/20 1354  Weight: 89.7 kg    Examination:  General exam: Appears calm and comfortable  Respiratory system: Clear to auscultation. Respiratory effort normal. Cardiovascular system: S1 & S2 heard, RRR. No JVD,  No pedal edema. Gastrointestinal system: Abdomen is nondistended, soft and nontender.  Normal bowel sounds heard. Central nervous system: Alert and oriented. No focal neurological deficits. Extremities: Symmetric 5 x 5 power. Skin: No rashes, lesions or ulcers Psychiatry: Mood & affect appropriate.     Data Reviewed: I have personally reviewed following labs and imaging studies  CBC: Recent Labs  Lab 04/13/20 0555 04/14/20 0540 04/15/20 0610 04/17/20 1330 04/18/20 0330  WBC 2.6* 2.9* 2.6* 4.9 3.7*  NEUTROABS 1.7  --  1.5*  --   --   HGB 7.5* 7.5* 7.7* 8.6*  7.7*  HCT 25.0* 25.8* 25.7* 29.3* 26.7*  MCV 78.6* 80.6 77.9* 81.4 80.7  PLT 196 219 223 277 914    Basic Metabolic Panel: Recent Labs  Lab 04/13/20 0555 04/14/20 0540 04/15/20 0610 04/17/20 1330 04/18/20 0330  NA 136 136 138 139 140  K 3.1* 3.4* 3.0* 3.5 3.5  CL 109 109 108 105 108  CO2 18* 17* 19* 19* 21*  GLUCOSE 110* 105* 98 120* 94  BUN 9 10 11 9 9   CREATININE 0.61 0.64 0.56* 0.61 0.59*  CALCIUM 7.8* 7.7* 8.0* 8.3*  8.1*  MG 1.9  --  2.0  --  2.0  PHOS  --   --   --   --  2.4*    GFR: Estimated Creatinine Clearance: 88 mL/min (A) (by C-G formula based on SCr of 0.59 mg/dL (L)).  Liver Function Tests: No results for input(s): AST, ALT, ALKPHOS, BILITOT, PROT, ALBUMIN in the last 168 hours.  CBG: Recent Labs  Lab 04/18/20 1641 04/18/20 1945 04/19/20 0727 04/19/20 1129 04/19/20 1639  GLUCAP 80 80 73 98 110*     Recent Results (from the past 240 hour(s))  SARS Coronavirus 2 by RT PCR (hospital order, performed in [facility] hospital lab) Nasopharyngeal Nasopharyngeal Swab     Status: None   Collection Time: 04/17/20  3:13 PM   Specimen: Nasopharyngeal Swab  Result Value Ref Range Status   SARS Coronavirus 2 NEGATIVE NEGATIVE Final    Comment: (NOTE) SARS-CoV-2 target nucleic acids are NOT DETECTED.  The SARS-CoV-2 RNA is generally detectable in upper and lower respiratory specimens during the acute phase of infection. The lowest concentration of SARS-CoV-2 viral copies this assay can detect is 250 copies / mL. A negative result does not preclude SARS-CoV-2 infection and should not be used as the sole basis for treatment or other patient management decisions.  A negative result may occur with improper specimen collection / handling, submission of specimen other than nasopharyngeal swab, presence of viral mutation(s) within the areas targeted by this assay, and inadequate number of viral copies (<250 copies / mL). A negative result must be combined with clinical observations, patient history, and epidemiological information.  Fact Sheet for Patients:   StrictlyIdeas.no  Fact Sheet for Healthcare Providers: BankingDealers.co.za  This test is not yet approved or  cleared by the [place] FDA and has been authorized for detection and/or diagnosis of SARS-CoV-2 by FDA under an Emergency Use Authorization (EUA).  This EUA will remain in  effect (meaning this test can be used) for the duration of the COVID-19 declaration under Section 564(b)(1) of the Act, 21 U.S.C. section 360bbb-3(b)(1), unless the authorization is terminated or revoked sooner.  Performed at [facility], [place] [address] [place], [place] [address]          Radiology Studies: No results found.      Scheduled Meds: . arformoterol  15 mcg Nebulization BID  . aspirin EC  81 mg Oral Daily  . atorvastatin  80 mg Oral Daily  . budesonide  0.25 mg Nebulization BID  . carvedilol  6.25 mg Oral BID WC  . cefdinir  300 mg Oral Q12H  . docusate sodium  200 mg Oral BID  . enoxaparin (LOVENOX) injection  40 mg Subcutaneous Q24H  . feeding supplement (ENSURE ENLIVE)  237 mL Oral BID BM  . folic acid  1 mg Oral Daily  . insulin aspart  0-6 Units Subcutaneous TID WC  . multivitamin with minerals  1 tablet Oral Daily  . pantoprazole  40 mg Oral BID  . sucralfate  1 g Oral TID WC & HS   Continuous Infusions: . lactated ringers with kcl Stopped (04/19/20 1700)     LOS: 0 days        Hosie Poisson, MD [facility]   To contact the attending provider between 7A-7P or the covering provider during after hours 7P-7A, please log into the web site [email or web address] and access using universal [place] password for that web site. If you do not have the password, please call the hospital operator.  04/19/2020, 5:22 PM

## 2020-04-19 NOTE — Progress Notes (Signed)
   Covid-19 Vaccination Clinic  Name:  Scott Conley    MRN: [id number] DOB: 1941/05/09  04/19/2020  Scott Conley was observed post Covid-19 immunization for 15 minutes without incident. He was provided with Vaccine Information Sheet and instruction to access the V-Safe system.   Scott Conley was instructed to call 911 with any severe reactions post vaccine: Marland Kitchen Difficulty breathing  . Swelling of face and throat  . A fast heartbeat  . A bad rash all over body  . Dizziness and weakness   Immunizations Administered    Name Date Dose VIS Date Route   JANSSEN COVID-19 VACCINE 04/19/2020  1:18 PM 0.5 mL 09/25/2019 Intramuscular   Manufacturer: Scott Conley   Lot: [id number]   [place]: [id number]

## 2020-04-19 NOTE — TOC Progression Note (Signed)
Transition of Care [facility]) - Progression Note    Patient Details  Name: ELLIAS MCELREATH MRN: [id number] Date of Birth: 1941-06-07  [facility] CM/SW Contact  Lennart Pall, LCSW Phone Number: 04/19/2020, 9:47 AM  Clinical Narrative:    Have received SNF bed offer from [facility] who can admit pt tomorrow.  Pt and daughter Jeannene Patella) are aware and accepting offer.  Daughter notes that pt has transportation services for rad tx in place via [place] and she will alert them to pt's change in pick up address.  She is aware that pt will also be placed in 14 day quarantine due to not being vaccinated.  MD aware.   Expected Discharge Plan: [place] Barriers to Discharge: Continued Medical Work up  Expected Discharge Plan and Services Expected Discharge Plan: [place] In-house Referral: Clinical Social Work Discharge Planning Services: CM Consult Post Acute Care Choice: [place], [place] Living arrangements for the past 2 months: Apartment Expected Discharge Date: 04/18/20                                     Social Determinants of Health (SDOH) Interventions    Readmission Risk Interventions No flowsheet data found.

## 2020-04-20 ENCOUNTER — Ambulatory Visit
Admission: RE | Admit: 2020-04-20 | Discharge: 2020-04-20 | Disposition: A | Discharge status: Home / Self Care | Payer: Medicare HMO | Source: Ambulatory Visit | Attending: Radiation Oncology | Admitting: Radiation Oncology

## 2020-04-20 DIAGNOSIS — Z5111 Encounter for antineoplastic chemotherapy: Secondary | ICD-10-CM | POA: Diagnosis not present

## 2020-04-20 DIAGNOSIS — D649 Anemia, unspecified: Secondary | ICD-10-CM | POA: Diagnosis not present

## 2020-04-20 DIAGNOSIS — E1165 Type 2 diabetes mellitus with hyperglycemia: Secondary | ICD-10-CM | POA: Diagnosis not present

## 2020-04-20 DIAGNOSIS — E440 Moderate protein-calorie malnutrition: Secondary | ICD-10-CM | POA: Diagnosis not present

## 2020-04-20 DIAGNOSIS — R531 Weakness: Secondary | ICD-10-CM | POA: Diagnosis not present

## 2020-04-20 DIAGNOSIS — M6281 Muscle weakness (generalized): Secondary | ICD-10-CM | POA: Diagnosis not present

## 2020-04-20 DIAGNOSIS — E876 Hypokalemia: Secondary | ICD-10-CM | POA: Diagnosis not present

## 2020-04-20 DIAGNOSIS — I723 Aneurysm of iliac artery: Secondary | ICD-10-CM | POA: Diagnosis not present

## 2020-04-20 DIAGNOSIS — E860 Dehydration: Secondary | ICD-10-CM | POA: Diagnosis not present

## 2020-04-20 DIAGNOSIS — R0781 Pleurodynia: Secondary | ICD-10-CM | POA: Diagnosis not present

## 2020-04-20 DIAGNOSIS — I69993 Ataxia following unspecified cerebrovascular disease: Secondary | ICD-10-CM | POA: Diagnosis not present

## 2020-04-20 DIAGNOSIS — R0602 Shortness of breath: Secondary | ICD-10-CM | POA: Diagnosis not present

## 2020-04-20 DIAGNOSIS — C3411 Malignant neoplasm of upper lobe, right bronchus or lung: Secondary | ICD-10-CM | POA: Diagnosis not present

## 2020-04-20 DIAGNOSIS — I2510 Atherosclerotic heart disease of native coronary artery without angina pectoris: Secondary | ICD-10-CM | POA: Diagnosis not present

## 2020-04-20 DIAGNOSIS — R0789 Other chest pain: Secondary | ICD-10-CM | POA: Diagnosis not present

## 2020-04-20 DIAGNOSIS — I10 Essential (primary) hypertension: Secondary | ICD-10-CM | POA: Diagnosis not present

## 2020-04-20 DIAGNOSIS — W19XXXA Unspecified fall, initial encounter: Secondary | ICD-10-CM | POA: Diagnosis not present

## 2020-04-20 DIAGNOSIS — Z20822 Contact with and (suspected) exposure to covid-19: Secondary | ICD-10-CM | POA: Diagnosis not present

## 2020-04-20 DIAGNOSIS — J9819 Other pulmonary collapse: Secondary | ICD-10-CM | POA: Diagnosis not present

## 2020-04-20 DIAGNOSIS — R1310 Dysphagia, unspecified: Secondary | ICD-10-CM | POA: Diagnosis not present

## 2020-04-20 DIAGNOSIS — Z87891 Personal history of nicotine dependence: Secondary | ICD-10-CM | POA: Diagnosis not present

## 2020-04-20 DIAGNOSIS — R911 Solitary pulmonary nodule: Secondary | ICD-10-CM | POA: Diagnosis not present

## 2020-04-20 DIAGNOSIS — R627 Adult failure to thrive: Secondary | ICD-10-CM | POA: Diagnosis not present

## 2020-04-20 DIAGNOSIS — J439 Emphysema, unspecified: Secondary | ICD-10-CM | POA: Diagnosis not present

## 2020-04-20 DIAGNOSIS — C3490 Malignant neoplasm of unspecified part of unspecified bronchus or lung: Secondary | ICD-10-CM | POA: Diagnosis not present

## 2020-04-20 DIAGNOSIS — Z85118 Personal history of other malignant neoplasm of bronchus and lung: Secondary | ICD-10-CM | POA: Diagnosis not present

## 2020-04-20 DIAGNOSIS — E119 Type 2 diabetes mellitus without complications: Secondary | ICD-10-CM | POA: Diagnosis not present

## 2020-04-20 DIAGNOSIS — R079 Chest pain, unspecified: Secondary | ICD-10-CM | POA: Diagnosis not present

## 2020-04-20 DIAGNOSIS — Z7982 Long term (current) use of aspirin: Secondary | ICD-10-CM | POA: Diagnosis not present

## 2020-04-20 DIAGNOSIS — R918 Other nonspecific abnormal finding of lung field: Secondary | ICD-10-CM | POA: Diagnosis not present

## 2020-04-20 DIAGNOSIS — C3431 Malignant neoplasm of lower lobe, right bronchus or lung: Secondary | ICD-10-CM | POA: Diagnosis not present

## 2020-04-20 DIAGNOSIS — R1312 Dysphagia, oropharyngeal phase: Secondary | ICD-10-CM | POA: Diagnosis not present

## 2020-04-20 DIAGNOSIS — J9811 Atelectasis: Secondary | ICD-10-CM | POA: Diagnosis not present

## 2020-04-20 DIAGNOSIS — I745 Embolism and thrombosis of iliac artery: Secondary | ICD-10-CM | POA: Diagnosis not present

## 2020-04-20 DIAGNOSIS — J189 Pneumonia, unspecified organism: Secondary | ICD-10-CM | POA: Diagnosis not present

## 2020-04-20 DIAGNOSIS — R0600 Dyspnea, unspecified: Secondary | ICD-10-CM | POA: Diagnosis not present

## 2020-04-20 DIAGNOSIS — R2689 Other abnormalities of gait and mobility: Secondary | ICD-10-CM | POA: Diagnosis not present

## 2020-04-20 DIAGNOSIS — R41841 Cognitive communication deficit: Secondary | ICD-10-CM | POA: Diagnosis not present

## 2020-04-20 DIAGNOSIS — K561 Intussusception: Secondary | ICD-10-CM | POA: Diagnosis not present

## 2020-04-20 DIAGNOSIS — R7981 Abnormal blood-gas level: Secondary | ICD-10-CM | POA: Diagnosis not present

## 2020-04-20 DIAGNOSIS — Z7401 Bed confinement status: Secondary | ICD-10-CM | POA: Diagnosis not present

## 2020-04-20 DIAGNOSIS — R5381 Other malaise: Secondary | ICD-10-CM | POA: Diagnosis not present

## 2020-04-20 DIAGNOSIS — Z8701 Personal history of pneumonia (recurrent): Secondary | ICD-10-CM | POA: Diagnosis not present

## 2020-04-20 DIAGNOSIS — Z510 Encounter for antineoplastic radiation therapy: Secondary | ICD-10-CM | POA: Diagnosis not present

## 2020-04-20 DIAGNOSIS — E785 Hyperlipidemia, unspecified: Secondary | ICD-10-CM | POA: Diagnosis not present

## 2020-04-20 DIAGNOSIS — Z79899 Other long term (current) drug therapy: Secondary | ICD-10-CM | POA: Diagnosis not present

## 2020-04-20 DIAGNOSIS — R059 Cough, unspecified: Secondary | ICD-10-CM | POA: Diagnosis not present

## 2020-04-20 DIAGNOSIS — I700 Atherosclerosis of aorta: Secondary | ICD-10-CM | POA: Diagnosis not present

## 2020-04-20 DIAGNOSIS — M2550 Pain in unspecified joint: Secondary | ICD-10-CM | POA: Diagnosis not present

## 2020-04-20 DIAGNOSIS — U071 COVID-19: Secondary | ICD-10-CM | POA: Diagnosis not present

## 2020-04-20 LAB — GLUCOSE, CAPILLARY
Glucose-Capillary: 93 mg/dL (ref 70–99)
Glucose-Capillary: 115 mg/dL — ABNORMAL HIGH (ref 70–99)

## 2020-04-20 NOTE — Progress Notes (Signed)
RN called [place] x2 to give report on Scott Conley. No answer after being transferred to nursing hall.    Patient transported via [facility] to SNF.      [place], Therapist, sports

## 2020-04-20 NOTE — Discharge Summary (Signed)
Discharge Summary  Scott Conley ZCH:[id number] DOB: 06/03/1941  PCP: Golden Circle, FNP  Admit date: 04/17/2020 Discharge date: 04/20/2020  Time spent: 35 minutes   Recommendations for Outpatient Follow-up:  1. Follow-up with Tye Savoy, NP gastroenterology on 04/25/2020 at 11:30 AM as scheduled. 2. Follow-up with Dr. Lorna Few, oncology in 1 week or as previously scheduled. 3. Follow-up with Golden Circle, FNP in 2 weeks.  On follow-up patient will need a basic metabolic profile done to follow-up on electrolytes and renal function.  4. Continue PT OT with assistance and fall precautions.  Discharge Diagnoses:  Active Hospital Problems   Diagnosis Date Noted   Generalized weakness 04/17/2020    Resolved Hospital Problems  No resolved problems to display.    Discharge Condition: Stable   Diet recommendation: Soft diet.  Vitals:   04/19/20 2051 04/20/20 0535  BP:  (!) 146/79  Pulse:  86  Resp:  18  Temp:  97.9 F (36.6 C)  SpO2: 99% 95%    History of present illness:  Scott Conley is a 79 y.o. male with medical history significant for recently diagnosed non-small cell lung cancer on chemotherapy and radiation, presented to [facility] ED due to generalized weakness and difficulty taking care of his ADLs.   States his legs felt so weak today that he had to cancel his radiation appointment.  Associated with poor appetite and poor oral intake.  Denies any fevers or chills.  He was discharged from [facility] on 04/16/2020 with home health services, was admitted for postobstructive pneumonia, currently on his last 3 days of oral antibiotics.  He lives at home with his daughter who works.  Will likely need SNF placement.  On presentation to the ED vital signs and lab studies are unremarkable.  UA pending at the time of this dictation.  Mildly dry mucous membranes on exam with concern for dehydration.  TRH asked to admit.  ED Course: Previous CT head done on 04/09/2020 did  not show any evidence of acute intracranial abnormalities.  04/20/20 Seen and examined.  No acute events overnight.  No new complaints.  Does not appear in distress.  Evaluated by PT with recommendation for SNF.  TOC consulted to assist with SNF placement.   Hospital Course:  Active Problems:   Generalized weakness  Generalized weakness, likely secondary to dehydration with poor appetite and poor oral intake Patient presented with generalized weakness after being discharged from the hospital the day prior to his presentation. Has not been able to take care of his ADLs, lives at home with his daughter who works. Mildly dry mucous membranes on exam with concern for dehydration.   Received IV fluid hydration Continue to encourage to increase oral protein calorie intake Continue oral supplement PT evaluated and recommended SNF TOC consulted to assist with SNF placement. Continue PT OT with assistance and fall precautions  Improving acute dehydration/Mild hypokalemia Mildly hypovolemic on exam Resolved hemoconcentration on lab study, hemoglobin 8.6 on presentation due to hemoconcentration, after IV fluid 7.7 Encourage oral intake.  Non-small cell lung cancer/left upper lobe nodule Patient noted to have been recently diagnosed with non-small cell lung cancer on chemotherapy and radiation treatment. Being followed by oncology in the outpatient setting. Patient currently full code. Per Dr. Starla Link patient thinks he might not be able to tolerate next round of chemo/radiation. This information was communicated with patient's oncologist Dr. Lorna Few via secure chat will follow up with patient in the outpatient setting. Patient followed by  IR and patient status post left upper lobe biopsy 04/12/2020 which was consistent with adenocarcinoma. Outpatient follow-up with oncology.   Anemia of chronic disease in the setting of malignancy Continue to monitor H&H No overt bleeding at the time  of this visit Keep your appointment with your hematologist oncologist.  Type 2 diabetes, well controlled  Hemoglobin A1c 6.6 on 03/04/20 Continue home regimen.  Hyperlipidemia Continue home Lipitor  Hypertension BP is at goal Continue home antihypertensives  Recent postobstructive pneumonia Complete your course of p.o. antibiotics  History of dysphagia likely secondary to severe esophagitis Continue home PPI Follow-up with GI outpatient as previously planned.  Submucosal gastric papule Follow-up with GI outpatient.  Physical deconditioning PT recommends SNF  Moderate protein calorie malnutrition Albumin 2.2 on 04/10/2020 Muscle mass loss BMI 24 Continue oral supplement Encouraged to increase oral protein calorie intake    DVT prophylaxis: SQ Lovenox daily  Code Status: Full code as stated by the patient himself.   Family Communication: None at bedside   Disposition Plan: Admit to Med-surg   Consults called: None    Discharge Exam: BP (!) 146/79 (BP Location: Left Arm)    Pulse 86    Temp 97.9 F (36.6 C) (Oral)    Resp 18    Ht 6\' 3"  (1.905 m)    Wt 89.7 kg    SpO2 95%    BMI 24.72 kg/m   General: 79 y.o. year-old male well developed well nourished in no acute distress.  Alert and interactive.  Cardiovascular: Regular rate and rhythm with no rubs or gallops.  No thyromegaly or JVD noted.    Respiratory: Clear to auscultation with no wheezes or rales. Good inspiratory effort.  Abdomen: Soft nontender nondistended with normal bowel sounds.  Musculoskeletal: No lower extremity edema bilaterally  Psychiatry: Mood is appropriate for condition and setting  Discharge Instructions You were cared for by a hospitalist during your hospital stay. If you have any questions about your discharge medications or the care you received while you were in the hospital after you are discharged, you can call the unit and asked to speak with the hospitalist on  call if the hospitalist that took care of you is not available. Once you are discharged, your primary care physician will handle any further medical issues. Please note that NO REFILLS for any discharge medications will be authorized once you are discharged, as it is imperative that you return to your primary care physician (or establish a relationship with a primary care physician if you do not have one) for your aftercare needs so that they can reassess your need for medications and monitor your lab values.  Discharge Instructions    Diet - low sodium heart healthy   Complete by: As directed    Discharge instructions   Complete by: As directed    Follow up with oncology as recommended.     Allergies as of 04/20/2020   No Known Allergies     Medication List    STOP taking these medications   bisacodyl 10 MG suppository Commonly known as: Dulcolax   polyethylene glycol 17 g packet Commonly known as: MiraLax   senna 8.6 MG Tabs tablet Commonly known as: SENOKOT   sitaGLIPtin 100 MG tablet Commonly known as: Januvia     TAKE these medications   arformoterol 15 MCG/2ML Nebu Commonly known as: BROVANA Take 2 mLs (15 mcg total) by nebulization 2 (two) times daily.   aspirin EC 81 MG tablet Take 1  tablet (81 mg total) by mouth daily. Swallow whole.   atorvastatin 80 MG tablet Commonly known as: LIPITOR Take 1 tablet (80 mg total) by mouth daily. Must keep appt w/new provider for future refills   budesonide 0.25 MG/2ML nebulizer solution Commonly known as: PULMICORT Take 2 mLs (0.25 mg total) by nebulization 2 (two) times daily.   carvedilol 6.25 MG tablet Commonly known as: Coreg Take 1 tablet (6.25 mg total) by mouth 2 (two) times daily with a meal.   docusate sodium 100 MG capsule Commonly known as: COLACE Take 200 mg by mouth 2 (two) times daily.   feeding supplement (ENSURE ENLIVE) Liqd Take 237 mLs by mouth 2 (two) times daily between meals.   folic acid 1 MG  tablet Commonly known as: FOLVITE Take 1 tablet (1 mg total) by mouth daily.   multivitamin with minerals Tabs tablet Take 1 tablet by mouth daily.   pantoprazole 40 MG tablet Commonly known as: PROTONIX Take 1 tablet (40 mg total) by mouth 2 (two) times daily.   prochlorperazine 10 MG tablet Commonly known as: COMPAZINE Take 1 tablet (10 mg total) by mouth every 6 (six) hours as needed for nausea or vomiting.   sucralfate 1 GM/10ML suspension Commonly known as: CARAFATE Take 10 mLs (1 g total) by mouth 4 (four) times daily -  with meals and at bedtime.   traMADol 50 MG tablet Commonly known as: ULTRAM Take 1 tablet (50 mg total) by mouth every 12 (twelve) hours as needed for up to 3 days for moderate pain. What changed: when to take this     ASK your doctor about these medications   cefdinir 300 MG capsule Commonly known as: OMNICEF Take 1 capsule (300 mg total) by mouth every 12 (twelve) hours for 3 days. Ask about: Should I take this medication?      No Known Allergies  Contact information for follow-up providers    Golden Circle, FNP. Call in 1 day(s).   Specialties: Family Medicine, Infectious Diseases Why: Please call for a post hospital follow up appointment Contact information: [place] [address] [phone]        Curt Bears, MD. Call in 1 day(s).   Specialty: Oncology Why: Please call for a post hospital follow up appointment. Contact information: [place] [address] [phone]        Willia Craze, NP. Call in 1 day(s).   Specialty: Gastroenterology Why: Please call for a post hospital follow up appointment. Contact information: [place] [address] [phone]            Contact information for after-discharge care    Destination    [facility] Preferred SNF .   Service: Skilled Nursing Contact information: [place] [phone]                   The results of significant diagnostics from this hospitalization (including imaging, microbiology, ancillary and laboratory) are listed below for reference.    Significant Diagnostic Studies: CT HEAD WO CONTRAST  Result Date: 04/09/2020 CLINICAL DATA:  Delirium.  Increased altered mental status. EXAM: CT HEAD WITHOUT CONTRAST TECHNIQUE: Contiguous axial images were obtained from the base of the skull through the vertex without intravenous contrast. COMPARISON:  Brain MRI 03/22/2020, head CT 11/26/2013. FINDINGS: Brain: The examination is significantly motion degraded, limiting evaluation. Stable, moderate generalized parenchymal atrophy. Redemonstrated chronic cortically based infarcts within  the right frontoparietal lobes. Stable, moderate ill-defined hypoattenuation within the cerebral white matter which is nonspecific, but consistent with chronic small vessel ischemic disease. No acute intracranial hemorrhage is identified. No acute demarcated cortical infarct is identified. No extra-axial fluid collection. No evidence of intracranial mass. No midline shift. Vascular: No hyperdense vessel.  Atherosclerotic calcifications Skull: Normal. Negative for fracture or focal lesion. Sinuses/Orbits: Visualized orbits show no acute finding. No significant paranasal sinus disease or mastoid effusion at the imaged levels. IMPRESSION: Significantly motion degraded examination, limiting evaluation. No acute intracranial abnormality is identified. Redemonstrated chronic cortically based infarcts within the right frontoparietal lobes. Stable background moderate generalized parenchymal atrophy and cerebral white matter chronic small vessel ischemic disease. Electronically Signed   By: Kellie Simmering DO   On: 04/09/2020 15:18   CT Angio Chest PE W and/or Wo Contrast  Result Date: 04/07/2020 CLINICAL DATA:  Positive D-dimer. Suspected pulmonary embolus with low to intermediate  probability. Diagnosis of stage III lung cancer with radiation today. EXAM: CT ANGIOGRAPHY CHEST WITH CONTRAST TECHNIQUE: Multidetector CT imaging of the chest was performed using the standard protocol during bolus administration of intravenous contrast. Multiplanar CT image reconstructions and MIPs were obtained to evaluate the vascular anatomy. CONTRAST:  139mL OMNIPAQUE IOHEXOL 350 MG/ML SOLN COMPARISON:  03/04/2020 FINDINGS: Cardiovascular: There is good opacification of the central and segmental pulmonary arteries. No focal filling defects. No evidence of significant pulmonary embolus. Normal heart size. No pericardial effusions. Normal caliber thoracic aorta. Scattered aortic and coronary artery calcifications. Mediastinum/Nodes: Right hilar lymphadenopathy and right hilar mass as seen on previous studies. The mass measures about 6.6 x 6.5 cm in diameter. Small esophageal hiatal hernia. Esophagus is decompressed. Lungs/Pleura: Emphysematous changes in the lungs. Scarring and pleural thickening in the left apex. Nodular scarring with spiculation in the left apex measures about 11 mm in diameter. Neoplasm is not excluded. Appearance is unchanged since prior study. Right hilar mass as previously indicated.Postobstructive pneumonia and atelectasis in the right lower lung medially and in the right middle lung. Upper Abdomen: No acute process demonstrated in the visualized upper abdominal contents. Musculoskeletal: No destructive bone lesions. Review of the MIP images confirms the above findings. IMPRESSION: 1. No evidence of significant pulmonary embolus. 2. Right hilar lymphadenopathy and right hilar mass as seen on previous studies. Postobstructive pneumonia and atelectasis in the right lower lung medially and in the right middle lung. 3. Nodular scarring with spiculation in the left apex measures about 11 mm in diameter. Neoplasm is not excluded. 4. Emphysematous changes in the lungs. 5. Small esophageal hiatal  hernia. 6. Emphysema and aortic atherosclerosis. Aortic Atherosclerosis (ICD10-I70.0) and Emphysema (ICD10-J43.9). Electronically Signed   By: Lucienne Capers M.D.   On: 04/07/2020 23:19   MR Brain W Wo Contrast  Result Date: 03/22/2020 CLINICAL DATA:  Staging non-small cell lung cancer. EXAM: MRI HEAD WITHOUT AND WITH CONTRAST TECHNIQUE: Multiplanar, multiecho pulse sequences of the brain and surrounding structures were obtained without and with intravenous contrast. CONTRAST:  28mL GADAVIST GADOBUTROL 1 MMOL/ML IV SOLN COMPARISON:  11/27/2013 FINDINGS: Brain: Diffusion imaging does not show any acute or subacute infarction. No focal abnormality affects the brainstem or cerebellum. Cerebral hemispheres show generalized atrophy with chronic small-vessel ischemic change of the hemispheric white matter. Old right frontoparietal vertex cortical and subcortical infarction. No evidence of primary or metastatic mass lesion, hemorrhage, hydrocephalus or extra-axial collection. Vascular: Major vessels at the base of the brain show flow. Skull and upper cervical spine: Negative Sinuses/Orbits: Clear/normal Other:  None IMPRESSION: 1. No evidence of metastatic disease. 2. Atrophy and chronic small-vessel ischemic changes as outlined above. Old right frontoparietal vertex cortical and subcortical infarction. Electronically Signed   By: Nelson Chimes M.D.   On: 03/22/2020 21:25   NM PET Image Initial (PI) Skull Base To Thigh  Addendum Date: 03/22/2020   ADDENDUM REPORT: 03/22/2020 14:10 ADDENDUM: As noted in the body of the report, there is hypermetabolism in the distal esophagus. This may be related to esophagitis, but consider upper endoscopy to exclude neoplasm. Electronically Signed   By: Misty Stanley M.D.   On: 03/22/2020 14:10   Result Date: 03/22/2020 CLINICAL DATA:  Initial treatment strategy for non-small-cell lung cancer. EXAM: NUCLEAR MEDICINE PET SKULL BASE TO THIGH TECHNIQUE: 10.4 mCi F-18 FDG was injected  intravenously. Full-ring PET imaging was performed from the skull base to thigh after the radiotracer. CT data was obtained and used for attenuation correction and anatomic localization. Fasting blood glucose: 127 mg/dl COMPARISON:  Insert CT angio chest 03/04/2020 FINDINGS: Mediastinal blood pool activity: SUV max 3.3 Liver activity: SUV max NA NECK: Asymmetric uptake noted right inferior parotid gland without a discrete mass lesion evident. No lymphadenopathy in this region. Incidental CT findings: none CHEST: Right parahilar mass is hypermetabolic with central necrosis. SUV max = 23.3. Hypermetabolic activity extends into the right hilum. No hypermetabolic mediastinal or left hilar lymphadenopathy. Focal nodular pleural thickening in the posterior left lung apex (image 55/4). Nodular component to this thickening measures 1.6 x 1.4 cm. SUV max = 5.1 Hypermetabolism is identified in the distal esophagus. Small to moderate hiatal hernia noted. Incidental CT findings: Coronary artery calcification is evident. Atherosclerotic calcification is noted in the wall of the thoracic aorta. Centrilobular emphsyema noted. ABDOMEN/PELVIS: No abnormal hypermetabolic activity within the liver, pancreas, adrenal glands, or spleen. No hypermetabolic lymph nodes in the abdomen or pelvis. There is marked focal hypermetabolism in the region of the ileocecal valve, potentially with mild intussusception in the terminal ileum. Although this is a noncontrast study, there is a suggestion of a soft tissue lesion in the cecum (see axial 158/4). As there are other areas of focal hypermetabolism in the colon, this may be physiologic. Incidental CT findings: There is abdominal aortic atherosclerosis without aneurysm. Right common iliac artery is aneurysmal up to 3.9 cm diameter. Left common iliac artery at the bifurcation is 1.7 cm diameter. Advanced diverticular disease noted in the left colon. SKELETON: No focal hypermetabolic activity to  suggest skeletal metastasis. Incidental CT findings: none IMPRESSION: 1. Right parahilar mass lesion is markedly hypermetabolic consistent with known malignancy. 2. Hypermetabolic nodular pleural thickening posterior left lung apex. This could represent metastatic disease or a synchronous primary. 3. Marked focal hypermetabolism in the right colon, in the region the ileocecal valve. Noncontrast CT imaging for attenuation correction raises concern for a soft tissue lesion at this location. CT abdomen/pelvis with oral and intravenous contrast would likely prove helpful to further evaluate. There may be an associated short segment intussusception of the terminal ileum. 4. Right common iliac artery aneurysm measuring up to 3.9 cm diameter. Interventional Radiology consultation recommended. 5. Asymmetric uptake inferior right parotid region without discrete mass lesion or lymphadenopathy evident. CT neck with contrast may prove helpful to assess for lesion not visible on noncontrast CT imaging today. 6.  Emphysema. (ICD10-J43.9) 7.  Aortic Atherosclerois (ICD10-170.0) Electronically Signed: By: Misty Stanley M.D. On: 03/22/2020 13:39   CT BIOPSY  Result Date: 04/12/2020 INDICATION: 79 year old male with a history of right-sided  lung cancer and a new hypermetabolic pleural based nodule in the periphery of the left lung apex. It is unclear if this represents a metastasis or a second primary lung malignancy. He presents for CT-guided biopsy to facilitate tissue diagnosis. EXAM: CT-guided biopsy left upper lobe pulmonary nodule Interventional Radiologist:  Criselda Peaches, MD MEDICATIONS: None. ANESTHESIA/SEDATION: Fentanyl 100 mcg IV; Versed 2 mg IV Moderate Sedation Time:  10 minutes The patient was continuously monitored during the procedure by the interventional radiology nurse under my direct supervision. FLUOROSCOPY TIME:  None. COMPLICATIONS: None immediate. Estimated blood loss:  0 PROCEDURE: Informed written  consent was obtained from the patient after a thorough discussion of the procedural risks, benefits and alternatives. All questions were addressed. Maximal Sterile Barrier Technique was utilized including caps, mask, sterile gowns, sterile gloves, sterile drape, hand hygiene and skin antiseptic. A timeout was performed prior to the initiation of the procedure. A planning axial CT scan was performed. The nodule in the left upper lobe was successfully identified. A suitable skin entry site was selected and marked. The region was then sterilely prepped and draped in standard fashion using Betadine skin prep. Local anesthesia was attained by infiltration with 1% lidocaine. A small dermatotomy was made. Under intermittent CT fluoroscopic guidance, a 17 gauge trocar needle was advanced into the lung and positioned at the margin of the nodule. Multiple 18 gauge core biopsies were then coaxially obtained using the BioPince automated biopsy device. Biopsy specimens were placed in formalin and delivered to pathology for further analysis. The biopsy device and introducer needle were removed. Post biopsy axial CT imaging demonstrates no evidence of immediate complication. There is no pneumothorax. Mild perilesional alveolar hemorrhage is not unexpected. The patient tolerated the procedure well. IMPRESSION: Technically successful CT-guided biopsy left upper lobe pulmonary nodule. Electronically Signed   By: Jacqulynn Cadet M.D.   On: 04/12/2020 14:06   DG Chest Port 1 View  Result Date: 04/12/2020 CLINICAL DATA:  Status post lung biopsy. EXAM: PORTABLE CHEST 1 VIEW COMPARISON:  April 07, 2020. FINDINGS: Heart size is within normal limits. Stable appearance of right infrahilar mass. No pneumothorax or pleural effusion is noted. The visualized skeletal structures are unremarkable. IMPRESSION: Stable appearance of right infrahilar mass. No other abnormality seen in the chest. Electronically Signed   By: Marijo Conception  M.D.   On: 04/12/2020 13:54   DG Chest Port 1 View  Result Date: 04/07/2020 CLINICAL DATA:  Stage III right lower lobe non-small cell lung cancer, dyspnea, cough EXAM: PORTABLE CHEST 1 VIEW COMPARISON:  03/04/2020 FINDINGS: 2 frontal views of the chest demonstrate right infrahilar mass unchanged since prior study. No new consolidation, effusion, or pneumothorax. Cardiac silhouette is stable. Loop recorder unchanged. IMPRESSION: 1. Stable right infrahilar mass. 2. No acute airspace disease. Electronically Signed   By: Randa Ngo M.D.   On: 04/07/2020 20:51    Microbiology: Recent Results (from the past 240 hour(s))  SARS Coronavirus 2 by RT PCR (hospital order, performed in [facility] hospital lab) Nasopharyngeal Nasopharyngeal Swab     Status: None   Collection Time: 04/17/20  3:13 PM   Specimen: Nasopharyngeal Swab  Result Value Ref Range Status   SARS Coronavirus 2 NEGATIVE NEGATIVE Final    Comment: (NOTE) SARS-CoV-2 target nucleic acids are NOT DETECTED.  The SARS-CoV-2 RNA is generally detectable in upper and lower respiratory specimens during the acute phase of infection. The lowest concentration of SARS-CoV-2 viral copies this assay can detect is 250 copies /  mL. A negative result does not preclude SARS-CoV-2 infection and should not be used as the sole basis for treatment or other patient management decisions.  A negative result may occur with improper specimen collection / handling, submission of specimen other than nasopharyngeal swab, presence of viral mutation(s) within the areas targeted by this assay, and inadequate number of viral copies (<250 copies / mL). A negative result must be combined with clinical observations, patient history, and epidemiological information.  Fact Sheet for Patients:   StrictlyIdeas.no  Fact Sheet for Healthcare Providers: BankingDealers.co.za  This test is not yet approved or  cleared by  the [place] FDA and has been authorized for detection and/or diagnosis of SARS-CoV-2 by FDA under an Emergency Use Authorization (EUA).  This EUA will remain in effect (meaning this test can be used) for the duration of the COVID-19 declaration under Section 564(b)(1) of the Act, 21 U.S.C. section 360bbb-3(b)(1), unless the authorization is terminated or revoked sooner.  Performed at [facility], [place] [address], [place], [place] [address]      Labs: Basic Metabolic Panel: Recent Labs  Lab 04/14/20 0540 04/15/20 0610 04/17/20 1330 04/18/20 0330  NA 136 138 139 140  K 3.4* 3.0* 3.5 3.5  CL 109 108 105 108  CO2 17* 19* 19* 21*  GLUCOSE 105* 98 120* 94  BUN 10 11 9 9   CREATININE 0.64 0.56* 0.61 0.59*  CALCIUM 7.7* 8.0* 8.3* 8.1*  MG  --  2.0  --  2.0  PHOS  --   --   --  2.4*   Liver Function Tests: No results for input(s): AST, ALT, ALKPHOS, BILITOT, PROT, ALBUMIN in the last 168 hours. No results for input(s): LIPASE, AMYLASE in the last 168 hours. No results for input(s): AMMONIA in the last 168 hours. CBC: Recent Labs  Lab 04/14/20 0540 04/15/20 0610 04/17/20 1330 04/18/20 0330  WBC 2.9* 2.6* 4.9 3.7*  NEUTROABS  --  1.5*  --   --   HGB 7.5* 7.7* 8.6* 7.7*  HCT 25.8* 25.7* 29.3* 26.7*  MCV 80.6 77.9* 81.4 80.7  PLT 219 223 277 248   Cardiac Enzymes: No results for input(s): CKTOTAL, CKMB, CKMBINDEX, TROPONINI in the last 168 hours. BNP: BNP (last 3 results) Recent Labs    04/07/20 2007  BNP 101.6*    ProBNP (last 3 results) No results for input(s): PROBNP in the last 8760 hours.  CBG: Recent Labs  Lab 04/19/20 0727 04/19/20 1129 04/19/20 1639 04/19/20 2131 04/20/20 0724  GLUCAP 73 98 110* 98 93       Signed:  Hosie Poisson, MD [facility] 04/20/2020, 8:12 AM

## 2020-04-20 NOTE — TOC Transition Note (Signed)
Transition of Care [facility]) - CM/SW Discharge Note   Patient Details  Name: Scott Conley MRN: [id number] Date of Birth: [birth year]  Transition of Care [facility]) CM/SW Contact:  Lennart Pall, LCSW Phone Number: 04/20/2020, 11:17 AM   Clinical Narrative:    Pt cleared for transport to [facility] today.  Pt and family agreed and daughter, [name], has notified [facility] transport service of change in pick up address.  No further TOC needs.   Final next level of care: Skilled Nursing Facility Barriers to Discharge: Barriers Resolved   Patient Goals and CMS Choice Patient states their goals for this hospitalization and ongoing recovery are:: wants to go to rehab CMS Medicare.gov Compare Post Acute Care list provided to:: Patient Choice offered to / list presented to : Patient  Discharge Placement PASRR number recieved: 04/18/20            Patient chooses bed at: [facility] Patient to be transferred to facility by: [facility] Name of family member notified: daughter, [name] Patient and family notified of of transfer: 04/20/20  Discharge Plan and Services In-house Referral: Clinical Social Work Discharge Planning Services: [employer] Consult Post Acute Care Choice: [place]          DME Arranged: N/A DME Agency: NA       HH Arranged: NA HH Agency: NA        Social Determinants of Health (SDOH) Interventions     Readmission Risk Interventions No flowsheet data found.

## 2020-04-20 NOTE — Plan of Care (Signed)
Plan of care reviewed and discussed with the patient. 

## 2020-04-21 ENCOUNTER — Ambulatory Visit: Admission: RE | Admit: 2020-04-21 | Payer: Medicare HMO | Source: Ambulatory Visit

## 2020-04-21 MED FILL — Dexamethasone Sodium Phosphate Inj 100 MG/10ML: INTRAMUSCULAR | Qty: 2 | Status: AC

## 2020-04-24 ENCOUNTER — Inpatient Hospital Stay: Payer: Medicare HMO | Admitting: Internal Medicine

## 2020-04-24 ENCOUNTER — Telehealth: Payer: Self-pay | Admitting: Radiation Oncology

## 2020-04-24 ENCOUNTER — Inpatient Hospital Stay: Payer: Medicare HMO

## 2020-04-24 ENCOUNTER — Ambulatory Visit: Payer: Medicare HMO

## 2020-04-24 ENCOUNTER — Telehealth: Payer: Self-pay | Admitting: Medical Oncology

## 2020-04-24 DIAGNOSIS — R7981 Abnormal blood-gas level: Secondary | ICD-10-CM | POA: Diagnosis not present

## 2020-04-24 NOTE — Telephone Encounter (Signed)
Received notification from Scott Simpson, PA-C that the patient did not present for radiation therapy today. Scott Conley explained the patient was discharged from the hospital to [facility]. Phoned Scott Conley and spoke with patient's nurse, Scott Conley. Scott Conley explains that she and the physical therapist tried working with the patient this morning to get him ready for radiation. Scott Conley explains that the patient refused because he "just doesn't feel up to it today." Scott Conley confirms they do have a copy of his radiation schedule. Also, she reports the patient's daughter is aware he didn't feel up to coming for radiation therapy today. Scott Conley confirms they will try again tomorrow. Will inform radiation treatment team of these findings.

## 2020-04-24 NOTE — Telephone Encounter (Signed)
Pam said her father refused treatment today.  She and her other sister will talk to pt today and call me aback about his decision about future treatment .

## 2020-04-25 ENCOUNTER — Ambulatory Visit
Admission: RE | Admit: 2020-04-25 | Discharge: 2020-04-25 | Disposition: A | Discharge status: Home / Self Care | Payer: Medicare HMO | Source: Ambulatory Visit | Attending: Radiation Oncology | Admitting: Radiation Oncology

## 2020-04-25 ENCOUNTER — Ambulatory Visit: Payer: Medicare HMO | Admitting: Nurse Practitioner

## 2020-04-25 ENCOUNTER — Other Ambulatory Visit: Payer: Self-pay

## 2020-04-25 DIAGNOSIS — E860 Dehydration: Secondary | ICD-10-CM | POA: Diagnosis not present

## 2020-04-25 DIAGNOSIS — R627 Adult failure to thrive: Secondary | ICD-10-CM | POA: Diagnosis not present

## 2020-04-25 DIAGNOSIS — C3490 Malignant neoplasm of unspecified part of unspecified bronchus or lung: Secondary | ICD-10-CM | POA: Diagnosis not present

## 2020-04-25 DIAGNOSIS — Z510 Encounter for antineoplastic radiation therapy: Secondary | ICD-10-CM | POA: Diagnosis not present

## 2020-04-25 DIAGNOSIS — C3431 Malignant neoplasm of lower lobe, right bronchus or lung: Secondary | ICD-10-CM | POA: Diagnosis not present

## 2020-04-25 DIAGNOSIS — R5381 Other malaise: Secondary | ICD-10-CM | POA: Diagnosis not present

## 2020-04-25 DIAGNOSIS — C3411 Malignant neoplasm of upper lobe, right bronchus or lung: Secondary | ICD-10-CM | POA: Diagnosis not present

## 2020-04-26 ENCOUNTER — Other Ambulatory Visit: Payer: Self-pay

## 2020-04-26 ENCOUNTER — Ambulatory Visit
Admission: RE | Admit: 2020-04-26 | Discharge: 2020-04-26 | Disposition: A | Discharge status: Home / Self Care | Payer: Medicare HMO | Source: Ambulatory Visit | Attending: Radiation Oncology | Admitting: Radiation Oncology

## 2020-04-26 DIAGNOSIS — C3411 Malignant neoplasm of upper lobe, right bronchus or lung: Secondary | ICD-10-CM | POA: Diagnosis not present

## 2020-04-26 DIAGNOSIS — Z510 Encounter for antineoplastic radiation therapy: Secondary | ICD-10-CM | POA: Diagnosis not present

## 2020-04-26 DIAGNOSIS — C3431 Malignant neoplasm of lower lobe, right bronchus or lung: Secondary | ICD-10-CM | POA: Diagnosis not present

## 2020-04-27 ENCOUNTER — Ambulatory Visit
Admission: RE | Admit: 2020-04-27 | Discharge: 2020-04-27 | Disposition: A | Discharge status: Home / Self Care | Payer: Medicare HMO | Source: Ambulatory Visit | Attending: Radiation Oncology | Admitting: Radiation Oncology

## 2020-04-27 ENCOUNTER — Other Ambulatory Visit: Payer: Self-pay

## 2020-04-27 DIAGNOSIS — C3411 Malignant neoplasm of upper lobe, right bronchus or lung: Secondary | ICD-10-CM | POA: Diagnosis not present

## 2020-04-27 DIAGNOSIS — Z510 Encounter for antineoplastic radiation therapy: Secondary | ICD-10-CM | POA: Diagnosis not present

## 2020-04-27 DIAGNOSIS — C3431 Malignant neoplasm of lower lobe, right bronchus or lung: Secondary | ICD-10-CM | POA: Diagnosis not present

## 2020-04-28 ENCOUNTER — Other Ambulatory Visit: Payer: Self-pay

## 2020-04-28 ENCOUNTER — Ambulatory Visit
Admission: RE | Admit: 2020-04-28 | Discharge: 2020-04-28 | Disposition: A | Discharge status: Home / Self Care | Payer: Medicare HMO | Source: Ambulatory Visit | Attending: Radiation Oncology | Admitting: Radiation Oncology

## 2020-04-28 DIAGNOSIS — Z510 Encounter for antineoplastic radiation therapy: Secondary | ICD-10-CM | POA: Diagnosis not present

## 2020-04-28 DIAGNOSIS — C3411 Malignant neoplasm of upper lobe, right bronchus or lung: Secondary | ICD-10-CM | POA: Insufficient documentation

## 2020-04-28 DIAGNOSIS — C3431 Malignant neoplasm of lower lobe, right bronchus or lung: Secondary | ICD-10-CM | POA: Diagnosis not present

## 2020-04-28 MED FILL — Dexamethasone Sodium Phosphate Inj 100 MG/10ML: INTRAMUSCULAR | Qty: 2 | Status: AC

## 2020-04-29 ENCOUNTER — Emergency Department (HOSPITAL_COMMUNITY): Payer: Medicare HMO

## 2020-04-29 ENCOUNTER — Emergency Department (HOSPITAL_COMMUNITY)
Admission: EM | Admit: 2020-04-29 | Discharge: 2020-04-29 | Disposition: A | Discharge status: Home / Self Care | Payer: Medicare HMO | Attending: Emergency Medicine | Admitting: Emergency Medicine

## 2020-04-29 ENCOUNTER — Encounter (HOSPITAL_COMMUNITY): Payer: Self-pay

## 2020-04-29 ENCOUNTER — Other Ambulatory Visit: Payer: Self-pay

## 2020-04-29 DIAGNOSIS — I723 Aneurysm of iliac artery: Secondary | ICD-10-CM | POA: Insufficient documentation

## 2020-04-29 DIAGNOSIS — E1165 Type 2 diabetes mellitus with hyperglycemia: Secondary | ICD-10-CM | POA: Diagnosis not present

## 2020-04-29 DIAGNOSIS — R0789 Other chest pain: Secondary | ICD-10-CM | POA: Insufficient documentation

## 2020-04-29 DIAGNOSIS — Z85118 Personal history of other malignant neoplasm of bronchus and lung: Secondary | ICD-10-CM | POA: Diagnosis not present

## 2020-04-29 DIAGNOSIS — R1310 Dysphagia, unspecified: Secondary | ICD-10-CM | POA: Diagnosis not present

## 2020-04-29 DIAGNOSIS — R0602 Shortness of breath: Secondary | ICD-10-CM | POA: Insufficient documentation

## 2020-04-29 DIAGNOSIS — I10 Essential (primary) hypertension: Secondary | ICD-10-CM | POA: Diagnosis not present

## 2020-04-29 DIAGNOSIS — R5381 Other malaise: Secondary | ICD-10-CM | POA: Diagnosis not present

## 2020-04-29 DIAGNOSIS — R059 Cough, unspecified: Secondary | ICD-10-CM | POA: Diagnosis not present

## 2020-04-29 DIAGNOSIS — J439 Emphysema, unspecified: Secondary | ICD-10-CM | POA: Diagnosis not present

## 2020-04-29 DIAGNOSIS — M2550 Pain in unspecified joint: Secondary | ICD-10-CM | POA: Diagnosis not present

## 2020-04-29 DIAGNOSIS — C3490 Malignant neoplasm of unspecified part of unspecified bronchus or lung: Secondary | ICD-10-CM | POA: Diagnosis not present

## 2020-04-29 DIAGNOSIS — I700 Atherosclerosis of aorta: Secondary | ICD-10-CM | POA: Insufficient documentation

## 2020-04-29 DIAGNOSIS — I2510 Atherosclerotic heart disease of native coronary artery without angina pectoris: Secondary | ICD-10-CM | POA: Insufficient documentation

## 2020-04-29 DIAGNOSIS — K561 Intussusception: Secondary | ICD-10-CM | POA: Insufficient documentation

## 2020-04-29 DIAGNOSIS — R079 Chest pain, unspecified: Secondary | ICD-10-CM | POA: Diagnosis not present

## 2020-04-29 DIAGNOSIS — Z87891 Personal history of nicotine dependence: Secondary | ICD-10-CM | POA: Insufficient documentation

## 2020-04-29 DIAGNOSIS — R531 Weakness: Secondary | ICD-10-CM | POA: Diagnosis not present

## 2020-04-29 DIAGNOSIS — J9811 Atelectasis: Secondary | ICD-10-CM | POA: Diagnosis not present

## 2020-04-29 DIAGNOSIS — Z7982 Long term (current) use of aspirin: Secondary | ICD-10-CM | POA: Insufficient documentation

## 2020-04-29 DIAGNOSIS — Z7401 Bed confinement status: Secondary | ICD-10-CM | POA: Diagnosis not present

## 2020-04-29 DIAGNOSIS — Z79899 Other long term (current) drug therapy: Secondary | ICD-10-CM | POA: Diagnosis not present

## 2020-04-29 DIAGNOSIS — Z5111 Encounter for antineoplastic chemotherapy: Secondary | ICD-10-CM | POA: Diagnosis not present

## 2020-04-29 DIAGNOSIS — R0781 Pleurodynia: Secondary | ICD-10-CM | POA: Diagnosis not present

## 2020-04-29 DIAGNOSIS — R918 Other nonspecific abnormal finding of lung field: Secondary | ICD-10-CM | POA: Diagnosis not present

## 2020-04-29 DIAGNOSIS — I745 Embolism and thrombosis of iliac artery: Secondary | ICD-10-CM | POA: Diagnosis not present

## 2020-04-29 LAB — COMPREHENSIVE METABOLIC PANEL
ALT: 33 U/L (ref 0–44)
AST: 38 U/L (ref 15–41)
Albumin: 2.2 g/dL — ABNORMAL LOW (ref 3.5–5.0)
Alkaline Phosphatase: 63 U/L (ref 38–126)
Anion gap: 14 (ref 5–15)
BUN: 12 mg/dL (ref 8–23)
CO2: 22 mmol/L (ref 22–32)
Calcium: 7.9 mg/dL — ABNORMAL LOW (ref 8.9–10.3)
Chloride: 102 mmol/L (ref 98–111)
Creatinine, Ser: 0.73 mg/dL (ref 0.61–1.24)
GFR calc Af Amer: >60 mL/min (ref >60)
GFR calc non Af Amer: >60 mL/min (ref >60)
Glucose, Bld: 118 mg/dL — ABNORMAL HIGH (ref 70–99)
Potassium: 3.9 mmol/L (ref 3.5–5.1)
Sodium: 138 mmol/L (ref 135–145)
Total Bilirubin: 1.5 mg/dL — ABNORMAL HIGH (ref 0.3–1.2)
Total Protein: 6.5 g/dL (ref 6.5–8.1)

## 2020-04-29 LAB — CBC WITH DIFFERENTIAL/PLATELET
Abs Immature Granulocytes: 0.06 10*3/uL (ref 0.00–0.07)
Basophils Absolute: 0 10*3/uL (ref 0.0–0.1)
Basophils Relative: 0 %
Eosinophils Absolute: 0.1 10*3/uL (ref 0.0–0.5)
Eosinophils Relative: 1 %
HCT: 27.3 % — ABNORMAL LOW (ref 39.0–52.0)
Hemoglobin: 8.2 g/dL — ABNORMAL LOW (ref 13.0–17.0)
Immature Granulocytes: 1 %
Lymphocytes Relative: 7 %
Lymphs Abs: 0.6 10*3/uL — ABNORMAL LOW (ref 0.7–4.0)
MCH: 23.8 pg — ABNORMAL LOW (ref 26.0–34.0)
MCHC: 30 g/dL (ref 30.0–36.0)
MCV: 79.1 fL — ABNORMAL LOW (ref 80.0–100.0)
Monocytes Absolute: 0.8 10*3/uL (ref 0.1–1.0)
Monocytes Relative: 9 %
Neutro Abs: 7.5 10*3/uL (ref 1.7–7.7)
Neutrophils Relative %: 82 %
Platelets: 262 10*3/uL (ref 150–400)
RBC: 3.45 MIL/uL — ABNORMAL LOW (ref 4.22–5.81)
RDW: 22.5 % — ABNORMAL HIGH (ref 11.5–15.5)
WBC: 9.1 10*3/uL (ref 4.0–10.5)
nRBC: 0 % (ref 0.0–0.2)

## 2020-04-29 LAB — LIPASE, BLOOD: Lipase: 19 U/L (ref 11–51)

## 2020-04-29 MED ORDER — IOHEXOL 300 MG/ML  SOLN
100.0000 mL | Freq: Once | INTRAMUSCULAR | Status: AC | PRN
  Administered 2020-04-29: 100 mL via INTRAVENOUS

## 2020-04-29 MED ORDER — SODIUM CHLORIDE (PF) 0.9 % IJ SOLN
INTRAMUSCULAR | Status: AC
  Filled 2020-04-29: qty 50

## 2020-04-29 MED ORDER — SODIUM CHLORIDE 0.9 % IV SOLN: INTRAVENOUS | Status: DC

## 2020-04-29 MED ORDER — SODIUM CHLORIDE 0.9 % IV BOLUS
1000.0000 mL | Freq: Once | INTRAVENOUS | Status: AC
  Administered 2020-04-29: 1000 mL via INTRAVENOUS

## 2020-04-29 NOTE — ED Notes (Signed)
Advised pt that urine sample is needed and pt has urinal

## 2020-04-29 NOTE — ED Notes (Signed)
Pt noted to have stage 2 sacral decubitus ulcer. Barrier cream applied and pressure relieved

## 2020-04-29 NOTE — ED Provider Notes (Signed)
[facility] DEPT Provider Note   CSN: [id number] Arrival date & time: 04/29/20  1303     History Chief Complaint  Patient presents with  . Rib pain    Scott Conley is a 79 y.o. male.  79 year old male with history of small cell cancer presents with 2-week history of trouble swallowing and now with increasing shortness of breath. States that his cough is gotten worse and feels as if he can chest cough the phlegm up he would feel better. Denies any orthopnea but does note some dyspnea on exertion. Has had some chest tightness that is worse when he coughs. Also notes that he is a trouble swallowing for 2 weeks and that recently it has become worse. Denies any fever or chills. Has had 2 hospitalizations over the past 2 weeks for dehydration. Is currently undergoing radiation treatment.        Past Medical History:  Diagnosis Date  . Aortic atherosclerosis ([place])   . Arthritis   . Blood clot in vein   . CVA (cerebral infarction)   . Diabetes ([place])   . DVT (deep venous thrombosis) ([place])   . Emphysema lung ([place])   . Hypertension   . Lung cancer ([place])   . Osteoarthritis   . Stroke [facility])    Left hand stiffness     Patient Active Problem List   Diagnosis Date Noted  . Generalized weakness 04/17/2020  . Dyspnea   . History of lung biopsy   . Abnormal PET scan of colon   . Erosive esophagitis   . Esophageal dysphagia   . Nodule of left lung   . Controlled type 2 diabetes mellitus with hyperglycemia ([place]) 04/08/2020  . Postobstructive pneumonia 04/07/2020  . Goals of care, counseling/discussion 03/20/2020  . Encounter for antineoplastic chemotherapy 03/20/2020  . Malignant neoplasm of bronchus of right lower lobe ([place])   . Hilar mass   . HLD (hyperlipidemia)   . Hypokalemia   . Hyponatremia   . Microcytic anemia   . Medicare annual wellness visit, subsequent 11/21/2016  . Obesity 11/21/2016  . Diabetes ([place]) 11/21/2015  . Hypertension  11/21/2015  . Low back strain 11/21/2015  . Tobacco use 03/01/2014  . Ataxia, late effect of cerebrovascular disease 01/18/2014  . CVA (cerebral infarction) 11/30/2013  . Stroke [facility]) 11/26/2013    Past Surgical History:  Procedure Laterality Date  . APPENDECTOMY    . BIOPSY  03/05/2020   Procedure: BIOPSY;  Surgeon: Garner Nash, DO;  Location: [facility] ENDOSCOPY;  Service: Pulmonary;;  . BIOPSY  04/11/2020   Procedure: BIOPSY;  Surgeon: Irving Copas., MD;  Location: [facility];  Service: Gastroenterology;;  . Cydney Ok  03/05/2020   Procedure: CRYOTHERAPY;  Surgeon: Garner Nash, DO;  Location: [facility] ENDOSCOPY;  Service: Pulmonary;;  . CYST REMOVAL LEG     removed from right groin  . ESOPHAGOGASTRODUODENOSCOPY (EGD) WITH PROPOFOL N/A 04/11/2020   Procedure: ESOPHAGOGASTRODUODENOSCOPY (EGD) WITH PROPOFOL;  Surgeon: Rush Landmark Telford Nab., MD;  Location: [facility] ENDOSCOPY;  Service: Gastroenterology;  Laterality: N/A;  . HEMOSTASIS CONTROL  03/05/2020   Procedure: HEMOSTASIS CONTROL;  Surgeon: Garner Nash, DO;  Location: [facility];  Service: Pulmonary;;  . LOOP RECORDER IMPLANT  11/29/13   MDT LinQ implanted by Dr Caryl Comes for cryptogenic stroke  . LOOP RECORDER IMPLANT N/A 11/29/2013   Procedure: LOOP RECORDER IMPLANT;  Surgeon: Deboraha Sprang, MD;  Location: [facility] CATH LAB;  Service: Cardiovascular;  Laterality: N/A;  . TEE  WITHOUT CARDIOVERSION N/A 11/29/2013   Procedure: TRANSESOPHAGEAL ECHOCARDIOGRAM (TEE);  Surgeon: Sueanne Margarita, MD;  Location: [facility] ENDOSCOPY;  Service: Cardiovascular;  Laterality: N/A;  . TONSILLECTOMY    . VIDEO BRONCHOSCOPY WITH ENDOBRONCHIAL ULTRASOUND N/A 03/05/2020   Procedure: VIDEO BRONCHOSCOPY WITH ENDOBRONCHIAL ULTRASOUND;  Surgeon: Garner Nash, DO;  Location: [facility];  Service: Pulmonary;  Laterality: N/A;       Family History  Problem Relation Age of Onset  . Breast cancer Mother   . Breast cancer Maternal Grandmother   . Cirrhosis  Brother     Social History   Tobacco Use  . Smoking status: Former Smoker    Packs/day: 0.50    Years: 58.00    Pack years: 29.00    Types: Cigarettes    Start date: 11/30/2013  . Smokeless tobacco: Never Used  Vaping Use  . Vaping Use: Never used  Substance Use Topics  . Alcohol use: No    Comment: none since 1985; hx alcohol abuse  . Drug use: No    Home Medications Prior to Admission medications   Medication Sig Start Date End Date Taking? Authorizing Provider  arformoterol (BROVANA) 15 MCG/2ML NEBU Take 2 mLs (15 mcg total) by nebulization 2 (two) times daily. 04/15/20   Eugenie Filler, MD  aspirin EC 81 MG tablet Take 1 tablet (81 mg total) by mouth daily. Swallow whole. 03/06/20 03/06/21  Kayleen Memos, DO  atorvastatin (LIPITOR) 80 MG tablet Take 1 tablet (80 mg total) by mouth daily. Must keep appt w/new provider for future refills 03/05/20   Kayleen Memos, DO  budesonide (PULMICORT) 0.25 MG/2ML nebulizer solution Take 2 mLs (0.25 mg total) by nebulization 2 (two) times daily. 04/15/20 07/14/20  Eugenie Filler, MD  carvedilol (COREG) 6.25 MG tablet Take 1 tablet (6.25 mg total) by mouth 2 (two) times daily with a meal. 03/05/20   Kayleen Memos, DO  docusate sodium (COLACE) 100 MG capsule Take 200 mg by mouth 2 (two) times daily.    [provider]  feeding supplement, ENSURE ENLIVE, (ENSURE ENLIVE) LIQD Take 237 mLs by mouth 2 (two) times daily between meals. 04/16/20   Eugenie Filler, MD  folic acid (FOLVITE) 1 MG tablet Take 1 tablet (1 mg total) by mouth daily. 04/16/20   Eugenie Filler, MD  Multiple Vitamin (MULTIVITAMIN WITH MINERALS) TABS tablet Take 1 tablet by mouth daily. 04/16/20   Eugenie Filler, MD  pantoprazole (PROTONIX) 40 MG tablet Take 1 tablet (40 mg total) by mouth 2 (two) times daily. 04/15/20   Eugenie Filler, MD  prochlorperazine (COMPAZINE) 10 MG tablet Take 1 tablet (10 mg total) by mouth every 6 (six) hours as needed for nausea or  vomiting. 03/20/20   Curt Bears, MD  sucralfate (CARAFATE) 1 GM/10ML suspension Take 10 mLs (1 g total) by mouth 4 (four) times daily -  with meals and at bedtime. 04/15/20 05/15/20  Eugenie Filler, MD    Allergies    Patient has no known allergies.  Review of Systems   Review of Systems  All other systems reviewed and are negative.   Physical Exam Updated Vital Signs BP 114/79 (BP Location: Left Arm)   Pulse 97   Temp 97.8 F (36.6 C) (Oral)   Resp 16   Ht 1.93 m (6\' 4" )   Wt 89.8 kg   SpO2 96%   BMI 24.10 kg/m   Physical Exam Vitals and nursing note reviewed.  Constitutional:      General: He is not in acute distress.    Appearance: He is cachectic. He is not toxic-appearing.  HENT:     Head: Normocephalic and atraumatic.  Eyes:     General: Lids are normal.     Conjunctiva/sclera: Conjunctivae normal.     Pupils: Pupils are equal, round, and reactive to light.  Neck:     Thyroid: No thyroid mass.     Trachea: No tracheal deviation.  Cardiovascular:     Rate and Rhythm: Normal rate and regular rhythm.     Heart sounds: Normal heart sounds. No murmur heard.  No gallop.   Pulmonary:     Effort: Pulmonary effort is normal. No respiratory distress.     Breath sounds: Normal breath sounds. No stridor. No decreased breath sounds, wheezing, rhonchi or rales.  Abdominal:     General: Bowel sounds are normal. There is no distension.     Palpations: Abdomen is soft.     Tenderness: There is no abdominal tenderness. There is no rebound.  Musculoskeletal:        General: No tenderness. Normal range of motion.     Cervical back: Normal range of motion and neck supple.  Skin:    General: Skin is warm and dry.     Findings: No abrasion or rash.  Neurological:     Mental Status: He is alert and oriented to person, place, and time.     GCS: GCS eye subscore is 4. GCS verbal subscore is 5. GCS motor subscore is 6.     Cranial Nerves: No cranial nerve deficit.      Sensory: No sensory deficit.  Psychiatric:        Speech: Speech normal.        Behavior: Behavior normal.     ED Results / Procedures / Treatments   Labs (all labs ordered are listed, but only abnormal results are displayed) Labs Reviewed  URINALYSIS, ROUTINE W REFLEX MICROSCOPIC  CBC WITH DIFFERENTIAL/PLATELET  COMPREHENSIVE METABOLIC PANEL  LIPASE, BLOOD    EKG None  Radiology No results found.  Procedures Procedures (including critical care time)  Medications Ordered in ED Medications  sodium chloride 0.9 % bolus 1,000 mL (has no administration in time range)  0.9 %  sodium chloride infusion (has no administration in time range)    ED Course  I have reviewed the triage vital signs and the nursing notes.  Pertinent labs & imaging results that were available during my care of the patient were reviewed by me and considered in my medical decision making (see chart for details).    MDM Rules/Calculators/A&P                          Patient's labs and x-rays reviewed. Patient's abdominal CT did show an ileocecal intussusception but patient has no abdominal discomfort. He has not had any emesis. Concern for possible cancer and patient will refer to GI. Patient was given IV hydration here. Chest that he is unchanged from prior. Patient states that his trouble swallowing has been going on for well over a month when questioned further. Will discharge back to facility Final Clinical Impression(s) / ED Diagnoses Final diagnoses:  None    Rx / DC Orders ED Discharge Orders    None       Lacretia Leigh, MD 04/29/20 2030

## 2020-04-29 NOTE — Discharge Instructions (Addendum)
Follow-up with your gastroenterologist. Return here if you should develop severe abdominal pain or vomiting.

## 2020-04-29 NOTE — ED Triage Notes (Signed)
Pt presents via EMS with c/o rib cage/intercostal for 2 weeks. Pt is a lung cancer pt currently undergoing chemo. Pt has followed up with his MD for the rib cage pain/intercostal pain with no new orders at this time from his MD.

## 2020-04-29 NOTE — ED Notes (Signed)
PTAR left with pt

## 2020-04-29 NOTE — ED Notes (Signed)
[facility] called for transport of pt back to [facility]

## 2020-05-01 ENCOUNTER — Telehealth: Payer: Self-pay | Admitting: Medical Oncology

## 2020-05-01 ENCOUNTER — Inpatient Hospital Stay: Payer: Medicare HMO

## 2020-05-01 ENCOUNTER — Ambulatory Visit: Payer: Medicare HMO

## 2020-05-01 DIAGNOSIS — Z8701 Personal history of pneumonia (recurrent): Secondary | ICD-10-CM | POA: Diagnosis not present

## 2020-05-01 DIAGNOSIS — E860 Dehydration: Secondary | ICD-10-CM | POA: Diagnosis not present

## 2020-05-01 DIAGNOSIS — E119 Type 2 diabetes mellitus without complications: Secondary | ICD-10-CM | POA: Diagnosis not present

## 2020-05-01 DIAGNOSIS — C3490 Malignant neoplasm of unspecified part of unspecified bronchus or lung: Secondary | ICD-10-CM | POA: Diagnosis not present

## 2020-05-01 NOTE — Telephone Encounter (Signed)
Appt today -Mix up re transportation. Appts cancelled.

## 2020-05-02 ENCOUNTER — Ambulatory Visit: Payer: Medicare HMO

## 2020-05-02 DIAGNOSIS — U071 COVID-19: Secondary | ICD-10-CM | POA: Diagnosis not present

## 2020-05-03 ENCOUNTER — Ambulatory Visit
Admission: RE | Admit: 2020-05-03 | Discharge: 2020-05-03 | Disposition: A | Discharge status: Home / Self Care | Payer: Medicare HMO | Source: Ambulatory Visit | Attending: Radiation Oncology | Admitting: Radiation Oncology

## 2020-05-03 ENCOUNTER — Other Ambulatory Visit: Payer: Self-pay

## 2020-05-03 DIAGNOSIS — C3411 Malignant neoplasm of upper lobe, right bronchus or lung: Secondary | ICD-10-CM | POA: Diagnosis not present

## 2020-05-03 DIAGNOSIS — Z510 Encounter for antineoplastic radiation therapy: Secondary | ICD-10-CM | POA: Diagnosis not present

## 2020-05-03 DIAGNOSIS — C3431 Malignant neoplasm of lower lobe, right bronchus or lung: Secondary | ICD-10-CM | POA: Diagnosis not present

## 2020-05-04 ENCOUNTER — Ambulatory Visit
Admission: RE | Admit: 2020-05-04 | Discharge: 2020-05-04 | Disposition: A | Discharge status: Home / Self Care | Payer: Medicare HMO | Source: Ambulatory Visit | Attending: Radiation Oncology | Admitting: Radiation Oncology

## 2020-05-04 ENCOUNTER — Other Ambulatory Visit: Payer: Self-pay

## 2020-05-04 DIAGNOSIS — C3431 Malignant neoplasm of lower lobe, right bronchus or lung: Secondary | ICD-10-CM | POA: Diagnosis not present

## 2020-05-04 DIAGNOSIS — Z510 Encounter for antineoplastic radiation therapy: Secondary | ICD-10-CM | POA: Diagnosis not present

## 2020-05-04 DIAGNOSIS — U071 COVID-19: Secondary | ICD-10-CM | POA: Diagnosis not present

## 2020-05-04 DIAGNOSIS — C3411 Malignant neoplasm of upper lobe, right bronchus or lung: Secondary | ICD-10-CM | POA: Diagnosis not present

## 2020-05-05 ENCOUNTER — Ambulatory Visit: Payer: Medicare HMO

## 2020-05-05 MED FILL — Dexamethasone Sodium Phosphate Inj 100 MG/10ML: INTRAMUSCULAR | Qty: 2 | Status: AC

## 2020-05-05 NOTE — Progress Notes (Signed)
[facility] OFFICE PROGRESS NOTE  [place], FNP [address] [place] [address]   Stage IIIB (T3, N2, M0) Non-Small Cell Lung Cancer, Adenocarcinoma. He presented with a right paratracheal mass, nodal pleural thickening posterior left lung apex. There was marked focal hypermetabolism in the right colon, in the region the ileocecal valve as well as hypermetabolism in the distal esophagus which may be related to esophagitis but cannot exclude neoplasm. He was diagnosed in August 2021.   PDL1 Expression: 100%  Molecular Biomarkers: Microsatellite status - MS-Stable Tumor Mutational Burden - 4 Muts/Mb Genomic Findings For a complete list of the genes assayed, please refer to the Appendix. KRAS G12V CDKN2A/B p16INK4a W110* and p14ARF G125R JAK2 V617F TERT promoter -124C>T TP53 W91* 7 Disease relevant genes with no reportable alterations: ALK, BRAF, EGFR, ERBB2, MET, RET, ROS1  PRIOR THERAPY: None  CURRENT THERAPY: Concurrent chemoradiation with carboplatin for an AUC of 2 and paclitaxel 45 mg/m2. First dose expected on 03/28/20. Status post 2 cycles.   INTERVAL HISTORY: Scott Conley 79 y.o. male returns to the clinic today for a follow-up visit. The patient is feeling short of breath today and is reporting left sided chest pain that reportedly started last night. He states he "can't hardly breath". He is currently a resident at [facility]. He does not wear oxygen at home. He is not on any blood thinners but states he has a history of a blood clot. Denies calf pain or swelling. His oxygen saturation is 91% and he typically is 100% in the clinic.   The patient's current treatment consists of weekly concurrent chemoradiation. The patient has not had treatment in several weeks due to refusing treatment and several visits to the ER. The patient's last chemotherapy was administered on 04/04/2020. In the interval since his last appointment with [place],  the patient was seen in the emergency room/hospital from 04/17/2020-04/20/2020 for weakness and dehydration. The patient was discharged to a SNF at [facility]. He again was seen in the emergency room on 04/29/2020 for rib pain shortness of breath and trouble swallowing. He received IV fluids and was referred to GI. He had his staging PET scan which noted focal hypermetabolism in the colon as well as hypermetabolism in the esophagus which may be related to esophagitis, but cannot exclude neoplasm. He was referred to GI and had an appointment; however, he was unable to make that appointment time due to a scheduling conflict. He had not rescheduled. The patient denies any abnormal bleeding or bruising although he has had anemia on labs despite missing several cycles of chemotherapy and the fact that his chemotherapy is mild. He is here for evaluation and consideration of starting cycle #3 of his treatment.     MEDICAL HISTORY: Past Medical History:  Diagnosis Date  . Aortic atherosclerosis ([place])   . Arthritis   . Blood clot in vein   . CVA (cerebral infarction)   . Diabetes ([place])   . DVT (deep venous thrombosis) ([place])   . Emphysema lung ([place])   . Hypertension   . Lung cancer ([place])   . Osteoarthritis   . Stroke [facility])    Left hand stiffness     ALLERGIES:  has No Known Allergies.  MEDICATIONS:  No current facility-administered medications for this visit.   Current Outpatient Medications  Medication Sig Dispense Refill  . arformoterol (BROVANA) 15 MCG/2ML NEBU Take 2 mLs (15 mcg total) by nebulization 2 (two) times  daily. 120 mL 1  . aspirin EC 81 MG tablet Take 1 tablet (81 mg total) by mouth daily. Swallow whole. 360 tablet 0  . atorvastatin (LIPITOR) 80 MG tablet Take 1 tablet (80 mg total) by mouth daily. Must keep appt w/new provider for future refills (Patient taking differently: Take 80 mg by mouth daily. ) 30 tablet 0  . budesonide (PULMICORT) 0.25 MG/2ML nebulizer solution Take 2  mLs (0.25 mg total) by nebulization 2 (two) times daily. 360 each 0  . carvedilol (COREG) 6.25 MG tablet Take 1 tablet (6.25 mg total) by mouth 2 (two) times daily with a meal. 60 tablet 0  . docusate sodium (COLACE) 100 MG capsule Take 200 mg by mouth 2 (two) times daily.    . ferrous gluconate (FERGON) 324 MG tablet Take 324 mg by mouth daily.    . folic acid (FOLVITE) 1 MG tablet Take 1 tablet (1 mg total) by mouth daily.    Marland Kitchen guaiFENesin (MUCINEX) 600 MG 12 hr tablet Take 600 mg by mouth 2 (two) times daily.    . Multiple Vitamin (MULTIVITAMIN WITH MINERALS) TABS tablet Take 1 tablet by mouth daily.    . pantoprazole (PROTONIX) 40 MG tablet Take 1 tablet (40 mg total) by mouth 2 (two) times daily. 60 tablet 1  . potassium chloride SA (KLOR-CON) 20 MEQ tablet Take 20 mEq by mouth daily.    . sucralfate (CARAFATE) 1 GM/10ML suspension Take 10 mLs (1 g total) by mouth 4 (four) times daily -  with meals and at bedtime. (Patient taking differently: Take 10 g by mouth 4 (four) times daily. ) 1200 mL 0  . traMADol (ULTRAM) 50 MG tablet Take 50 mg by mouth every 12 (twelve) hours as needed for moderate pain.    . feeding supplement, ENSURE ENLIVE, (ENSURE ENLIVE) LIQD Take 237 mLs by mouth 2 (two) times daily between meals. (Patient not taking: Reported on 04/29/2020) 237 mL 12  . prochlorperazine (COMPAZINE) 10 MG tablet Take 1 tablet (10 mg total) by mouth every 6 (six) hours as needed for nausea or vomiting. (Patient not taking: Reported on 05/08/2020) 30 tablet 0   Facility-Administered Medications Ordered in Other Visits  Medication Dose Route Frequency Provider Last Rate Last Admin  . lactated ringers bolus 1,000 mL  1,000 mL Intravenous Once Blanchie Dessert, MD        SURGICAL HISTORY:  Past Surgical History:  Procedure Laterality Date  . APPENDECTOMY    . BIOPSY  03/05/2020   Procedure: BIOPSY;  Surgeon: Garner Nash, DO;  Location: [facility] ENDOSCOPY;  Service: Pulmonary;;  . BIOPSY   04/11/2020   Procedure: BIOPSY;  Surgeon: Irving Copas., MD;  Location: [facility];  Service: Gastroenterology;;  . Cydney Ok  03/05/2020   Procedure: CRYOTHERAPY;  Surgeon: Garner Nash, DO;  Location: [facility] ENDOSCOPY;  Service: Pulmonary;;  . CYST REMOVAL LEG     removed from right groin  . ESOPHAGOGASTRODUODENOSCOPY (EGD) WITH PROPOFOL N/A 04/11/2020   Procedure: ESOPHAGOGASTRODUODENOSCOPY (EGD) WITH PROPOFOL;  Surgeon: Rush Landmark Telford Nab., MD;  Location: [facility] ENDOSCOPY;  Service: Gastroenterology;  Laterality: N/A;  . HEMOSTASIS CONTROL  03/05/2020   Procedure: HEMOSTASIS CONTROL;  Surgeon: Garner Nash, DO;  Location: [facility]OSCOPY;  Service: Pulmonary;;  . LOOP RECORDER IMPLANT  11/29/13   MDT LinQ implanted by Dr Caryl Comes for cryptogenic stroke  . LOOP RECORDER IMPLANT N/A 11/29/2013   Procedure: LOOP RECORDER IMPLANT;  Surgeon: Deboraha Sprang, MD;  Location: [facility] CATH  LAB;  Service: Cardiovascular;  Laterality: N/A;  . TEE WITHOUT CARDIOVERSION N/A 11/29/2013   Procedure: TRANSESOPHAGEAL ECHOCARDIOGRAM (TEE);  Surgeon: Sueanne Margarita, MD;  Location: [facility] ENDOSCOPY;  Service: Cardiovascular;  Laterality: N/A;  . TONSILLECTOMY    . VIDEO BRONCHOSCOPY WITH ENDOBRONCHIAL ULTRASOUND N/A 03/05/2020   Procedure: VIDEO BRONCHOSCOPY WITH ENDOBRONCHIAL ULTRASOUND;  Surgeon: Garner Nash, DO;  Location: [facility];  Service: Pulmonary;  Laterality: N/A;    REVIEW OF SYSTEMS:   Review of Systems  Constitutional: Positive for fatigue, weakness, weight loss. Negative for chills and fever HENT: Negative for mouth sores, nosebleeds, sore throat and trouble swallowing.   Eyes: Negative for eye problems and icterus.  Respiratory: Positive for shortness of breath. Negative for cough, hemoptysis, and wheezing.   Cardiovascular: Negative for chest pain and leg swelling.  Gastrointestinal: Negative for abdominal pain, constipation, diarrhea, nausea and vomiting.  Genitourinary: Negative for  bladder incontinence, difficulty urinating, dysuria, frequency and hematuria.   Musculoskeletal: Negative for back pain, gait problem, neck pain and neck stiffness.  Skin: Negative for itching and rash.  Neurological: Negative for dizziness, extremity weakness, gait problem, headaches, light-headedness and seizures.  Hematological: Negative for adenopathy. Does not bruise/bleed easily.  Psychiatric/Behavioral: Negative for confusion, depression and sleep disturbance. The patient is not nervous/anxious.     PHYSICAL EXAMINATION:  Blood pressure 96/84, pulse 60, temperature 98.2 F (36.8 C), temperature source Tympanic, resp. rate 20, height _0  (1.93 m), SpO2 91 %.  ECOG PERFORMANCE STATUS: 3 - Symptomatic, >50% confined to bed  Physical Exam  Constitutional: Oriented to person, place, and time and elderly appearing and chronically ill appearing male and in no distress. HENT:  Head: Normocephalic and atraumatic.  Mouth/Throat: Oropharynx is clear and moist. No oropharyngeal exudate.  Eyes: Conjunctivae are normal. Right eye exhibits no discharge. Left eye exhibits no discharge. No scleral icterus.  Neck: Normal range of motion. Neck supple.  Cardiovascular: Normal rate, regular rhythm, normal heart sounds and intact distal pulses.   Pulmonary/Chest: Effort normal and breath sounds normal. No respiratory distress. No wheezes. No rales.  Abdominal: Soft. Bowel sounds are normal. Exhibits no distension and no mass. There is no tenderness.  Musculoskeletal: Normal range of motion. Exhibits no edema.  Lymphadenopathy:    No cervical adenopathy.  Neurological: Alert and oriented to person, place, and time. Exhibits muscle wasting. Examined in the wheelchair.  Skin: Skin is warm and dry. No rash noted. Not diaphoretic. No erythema. No pallor.  Psychiatric: Mood, memory and judgment normal.  Vitals reviewed.  LABORATORY DATA: Lab Results  Component Value Date   WBC 10.1 05/08/2020   HGB  8.4 (L) 05/08/2020   HCT 28.6 (L) 05/08/2020   MCV 80.3 05/08/2020   PLT 256 05/08/2020      Chemistry      Component Value Date/Time   NA 140 05/08/2020 1059   K 2.7 (LL) 05/08/2020 1059   CL 106 05/08/2020 1059   CO2 22 05/08/2020 1059   BUN 10 05/08/2020 1059   CREATININE 0.84 05/08/2020 1059      Component Value Date/Time   CALCIUM 8.5 (L) 05/08/2020 1059   ALKPHOS 68 05/08/2020 1059   AST 29 05/08/2020 1059   ALT 31 05/08/2020 1059   BILITOT 1.0 05/08/2020 1059       RADIOGRAPHIC STUDIES:  CT HEAD WO CONTRAST  Result Date: 04/09/2020 CLINICAL DATA:  Delirium.  Increased altered mental status. EXAM: CT HEAD WITHOUT CONTRAST TECHNIQUE: Contiguous axial images were obtained from  the base of the skull through the vertex without intravenous contrast. COMPARISON:  Brain MRI 03/22/2020, head CT 11/26/2013. FINDINGS: Brain: The examination is significantly motion degraded, limiting evaluation. Stable, moderate generalized parenchymal atrophy. Redemonstrated chronic cortically based infarcts within the right frontoparietal lobes. Stable, moderate ill-defined hypoattenuation within the cerebral white matter which is nonspecific, but consistent with chronic small vessel ischemic disease. No acute intracranial hemorrhage is identified. No acute demarcated cortical infarct is identified. No extra-axial fluid collection. No evidence of intracranial mass. No midline shift. Vascular: No hyperdense vessel.  Atherosclerotic calcifications Skull: Normal. Negative for fracture or focal lesion. Sinuses/Orbits: Visualized orbits show no acute finding. No significant paranasal sinus disease or mastoid effusion at the imaged levels. IMPRESSION: Significantly motion degraded examination, limiting evaluation. No acute intracranial abnormality is identified. Redemonstrated chronic cortically based infarcts within the right frontoparietal lobes. Stable background moderate generalized parenchymal atrophy and  cerebral white matter chronic small vessel ischemic disease. Electronically Signed   By: Kellie Simmering DO   On: 04/09/2020 15:18   CT Chest W Contrast  Result Date: 04/29/2020 CLINICAL DATA:  Ribcage, intercostal pain for 2 weeks, lung cancer, current chemotherapy EXAM: CT CHEST, ABDOMEN, AND PELVIS WITH CONTRAST TECHNIQUE: Multidetector CT imaging of the chest, abdomen and pelvis was performed following the standard protocol during bolus administration of intravenous contrast. CONTRAST:  125m OMNIPAQUE IOHEXOL 300 MG/ML  SOLN COMPARISON:  CT chest angiogram, 04/07/2020, PET-CT, 03/22/2020 FINDINGS: CT CHEST FINDINGS Cardiovascular: Aortic atherosclerosis. Normal heart size. Three-vessel coronary artery calcifications. No pericardial effusion. Mediastinum/Nodes: No remaining enlarged mediastinal, hilar, or axillary lymph nodes. Small hiatal hernia. Thyroid gland, trachea, and esophagus demonstrate no significant findings. Lungs/Pleura: Right hilar mass is slightly decreased in size compared to prior examination, measuring 6.0 x 5.9 cm, previously 7.4 x 6.7 cm when measured similarly (series 2, image 39). There is redemonstrated obstruction of the right lower lobe bronchus with total obstructive atelectasis of the right lower lobe. Unchanged subpleural nodule of the posteromedial left pulmonary apex measuring 1.2 x 1.1 cm (series 4, image 8). Mild centrilobular and paraseptal emphysema. Musculoskeletal: No chest wall mass or suspicious bone lesions identified. CT ABDOMEN PELVIS FINDINGS Hepatobiliary: No solid liver abnormality is seen. No gallstones, gallbladder wall thickening, or biliary dilatation. Pancreas: Unremarkable. No pancreatic ductal dilatation or surrounding inflammatory changes. Spleen: Normal in size without significant abnormality. Adrenals/Urinary Tract: Adrenal glands are unremarkable. Kidneys are normal, without renal calculi, solid lesion, or hydronephrosis. Bladder is unremarkable.  Stomach/Bowel: Stomach is within normal limits. Appendix appears normal. There is a sizable ileocecal intussusception, the lead point of which is likely a intramural lipoma of the terminal ileum more clearly demonstrated on prior examination (series 5, image 80). Descending and sigmoid diverticulosis. No evidence of bowel wall thickening, distention, or inflammatory changes. Vascular/Lymphatic: Aortic atherosclerosis. 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 No enlarged abdominal or pelvic lymph nodes. Reproductive: No mass or other abnormality. Other: No abdominal wall hernia or abnormality. No abdominopelvic ascites. Musculoskeletal: No acute or significant osseous findings. IMPRESSION: 1. No acute findings to explain right-sided chest pain. 2. Right hilar mass is slightly decreased in size compared to prior examination, consistent with treatment response. There is redemonstrated obstruction of the right lower lobe bronchus with total obstructive atelectasis of the right lower lobe. 3. Unchanged subpleural nodule of the posteromedial left pulmonary apex measuring 1.2 x 1.1 cm, previously PET avid  and concerning for metastatic nodule versus synchronous primary lesion. 4. No evidence of distant metastatic disease in the abdomen or pelvis. 5. There is a sizable ileocecal intussusception, the lead point of which is likely an intramural lipoma of the terminal ileum more clearly demonstrated on prior examination. Correlate for acutely referable symptoms and consider colonoscopy to further evaluate and exclude colon malignancy. 6. 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 Recommend vascular referral on a nonemergent basis. Aortic Atherosclerosis (ICD10-I70.0).  7. Coronary artery disease. 8. Emphysema (ICD10-J43.9). Electronically Signed   By: Eddie Candle M.D.   On: 04/29/2020 19:53   CT Abdomen Pelvis W Contrast  Result Date: 04/29/2020 CLINICAL DATA:  Ribcage, intercostal pain for 2 weeks, lung cancer, current chemotherapy EXAM: CT CHEST, ABDOMEN, AND PELVIS WITH CONTRAST TECHNIQUE: Multidetector CT imaging of the chest, abdomen and pelvis was performed following the standard protocol during bolus administration of intravenous contrast. CONTRAST:  178m OMNIPAQUE IOHEXOL 300 MG/ML  SOLN COMPARISON:  CT chest angiogram, 04/07/2020, PET-CT, 03/22/2020 FINDINGS: CT CHEST FINDINGS Cardiovascular: Aortic atherosclerosis. Normal heart size. Three-vessel coronary artery calcifications. No pericardial effusion. Mediastinum/Nodes: No remaining enlarged mediastinal, hilar, or axillary lymph nodes. Small hiatal hernia. Thyroid gland, trachea, and esophagus demonstrate no significant findings. Lungs/Pleura: Right hilar mass is slightly decreased in size compared to prior examination, measuring 6.0 x 5.9 cm, previously 7.4 x 6.7 cm when measured similarly (series 2, image 39). There is redemonstrated obstruction of the right lower lobe bronchus with total obstructive atelectasis of the right lower lobe. Unchanged subpleural nodule of the posteromedial left pulmonary apex measuring 1.2 x 1.1 cm (series 4, image 8). Mild centrilobular and paraseptal emphysema. Musculoskeletal: No chest wall mass or suspicious bone lesions identified. CT ABDOMEN PELVIS FINDINGS Hepatobiliary: No solid liver abnormality is seen. No gallstones, gallbladder wall thickening, or biliary dilatation. Pancreas: Unremarkable. No pancreatic ductal dilatation or surrounding inflammatory changes. Spleen: Normal in size without significant abnormality. Adrenals/Urinary Tract: Adrenal glands are unremarkable. Kidneys are normal, without renal calculi, solid lesion, or hydronephrosis. Bladder is unremarkable.  Stomach/Bowel: Stomach is within normal limits. Appendix appears normal. There is a sizable ileocecal intussusception, the lead point of which is likely a intramural lipoma of the terminal ileum more clearly demonstrated on prior examination (series 5, image 80). Descending and sigmoid diverticulosis. No evidence of bowel wall thickening, distention, or inflammatory changes. Vascular/Lymphatic: Aortic atherosclerosis. 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 No enlarged abdominal or pelvic lymph nodes. Reproductive: No mass or other abnormality. Other: No abdominal wall hernia or abnormality. No abdominopelvic ascites. Musculoskeletal: No acute or significant osseous findings. IMPRESSION: 1. No acute findings to explain right-sided chest pain. 2. Right hilar mass is slightly decreased in size compared to prior examination, consistent with treatment response. There is redemonstrated obstruction of the right lower lobe bronchus with total obstructive atelectasis of the right lower lobe. 3. Unchanged subpleural nodule of the posteromedial left pulmonary apex measuring 1.2 x 1.1 cm, previously PET avid and concerning for metastatic nodule versus synchronous primary lesion. 4. No evidence of distant metastatic disease in the abdomen or pelvis. 5. There is a sizable ileocecal intussusception, the lead point of which is likely an intramural lipoma of the terminal ileum more clearly demonstrated on prior examination. Correlate  for acutely referable symptoms and consider colonoscopy to further evaluate and exclude colon malignancy. 6. 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 Recommend vascular referral on a nonemergent basis. Aortic Atherosclerosis (ICD10-I70.0).  7. Coronary artery disease. 8. Emphysema (ICD10-J43.9). Electronically Signed   By: Eddie Candle M.D.   On: 04/29/2020 19:53   CT BIOPSY  Result Date: 04/12/2020 INDICATION: 79 year old male with a history of right-sided lung cancer and a new hypermetabolic pleural based nodule in the periphery of the left lung apex. It is unclear if this represents a metastasis or a second primary lung malignancy. He presents for CT-guided biopsy to facilitate tissue diagnosis. EXAM: CT-guided biopsy left upper lobe pulmonary nodule Interventional Radiologist:  Criselda Peaches, MD MEDICATIONS: None. ANESTHESIA/SEDATION: Fentanyl 100 mcg IV; Versed 2 mg IV Moderate Sedation Time:  10 minutes The patient was continuously monitored during the procedure by the interventional radiology nurse under my direct supervision. FLUOROSCOPY TIME:  None. COMPLICATIONS: None immediate. Estimated blood loss:  0 PROCEDURE: Informed written consent was obtained from the patient after a thorough discussion of the procedural risks, benefits and alternatives. All questions were addressed. Maximal Sterile Barrier Technique was utilized including caps, mask, sterile gowns, sterile gloves, sterile drape, hand hygiene and skin antiseptic. A timeout was performed prior to the initiation of the procedure. A planning axial CT scan was performed. The nodule in the left upper lobe was successfully identified. A suitable skin entry site was selected and marked. The region was then sterilely prepped and draped in standard fashion using Betadine skin prep. Local anesthesia was attained by infiltration with 1% lidocaine. A small dermatotomy was made. Under intermittent CT fluoroscopic guidance, a 17 gauge trocar needle was advanced into the lung and positioned at the margin of the nodule. Multiple 18 gauge core biopsies were then coaxially obtained using the BioPince automated biopsy device. Biopsy specimens were placed in formalin and delivered to pathology  for further analysis. The biopsy device and introducer needle were removed. Post biopsy axial CT imaging demonstrates no evidence of immediate complication. There is no pneumothorax. Mild perilesional alveolar hemorrhage is not unexpected. The patient tolerated the procedure well. IMPRESSION: Technically successful CT-guided biopsy left upper lobe pulmonary nodule. Electronically Signed   By: Jacqulynn Cadet M.D.   On: 04/12/2020 14:06   DG Chest Port 1 View  Result Date: 04/29/2020 CLINICAL DATA:  Cough, rib pain HX 2 weeks. Lung cancer history undergoing chemo. Diabetes, hypertension, stroke, former smoker. EXAM: PORTABLE CHEST 1 VIEW COMPARISON:  Chest x-ray 04/12/2020, CT chest 04/07/2020, chest x-ray 11/27/2013 FINDINGS: Loop recorder noted overlying the left chest. Cardiomediastinal silhouette is unchanged. Partially visualized right hilar mass measuring at least 5.5 x 6.8 cm consistent with known lung malignancy. Associated right lower lobe collapse. Redemonstration of compressive atelectasis within the right middle lobe. Otherwise no focal consolidation. No pulmonary edema. No pleural effusion. No pneumothorax. No acute osseous abnormality. Redemonstration of a sclerotic lesion of the seventh left posterior rib (finding stable from the 2021 studies but appears to be new compared to 2015 study). IMPRESSION: 1. Right hilar mass consistent with known lung malignancy with associated right lower lobe collapse. Cannot exclude underlying infection/inflammation within the right lower lobe. 2. No new cardiopulmonary abnormality. 3. Indeterminate similar-appearing sclerotic lesion of the seventh left posterior rib. Electronically Signed   By: Iven Finn M.D.   On:  04/29/2020 15:50   DG Chest Port 1 View  Result Date: 04/12/2020 CLINICAL DATA:  Status post lung biopsy. EXAM: PORTABLE CHEST 1 VIEW COMPARISON:  April 07, 2020. FINDINGS: Heart size is within normal limits. Stable appearance of right  infrahilar mass. No pneumothorax or pleural effusion is noted. The visualized skeletal structures are unremarkable. IMPRESSION: Stable appearance of right infrahilar mass. No other abnormality seen in the chest. Electronically Signed   By: Marijo Conception M.D.   On: 04/12/2020 13:54     ASSESSMENT/PLAN:  This is a very pleasant 79 year old African American male with stage IIIB (T3, N2, M0) Non-Small Cell Lung Cancer, Adenocarcinoma. He presented with a right paratracheal mass, nodal pleural thickening in the posterior left lung apex. There was marked focal hypermetabolism in the right colon, in the region of the ileocecal valve as well as distal esophagus which may be related to esophagitis but neoplasm cannot be excluded. He was diagnosed in August 2021. His PDL1 expression is 100% and he has no actionable mutations.   He is scheduled to begin concurrent chemoradiation with carboplatin for an AUC if 2 and paclitaxel 45 mg/m2 weekly. He is status post two cycles. The patient has not had treatment in several weeks and had refused treatment. He is currently residing in a SNF at [facility].  The patient was seen with Dr. Julien Nordmann. The patient states that he "can't hardly breath". His oxygen saturation is 91% on RA. He is also endorsing left sided chest pain. His labs also demonstrate hypokalemia with a potassium of 2.7 today.   Given the shortness of breath, hypokalemia, and chest pain, the patient will be evaluated in the ER for a more extensive workup.   We will reschedule his appointments until next week until improvement in his symptoms.   Recommend he follow up with GI referral as well on an outpatient basis.   The patient was advised to call immediately if she has any concerning symptoms in the interval. The patient voices understanding of current disease status and treatment options and is in agreement with the current care plan. All questions were answered. The patient knows to call the  clinic with any problems, questions or concerns. We can certainly see the patient much sooner if necessary   No orders of the defined types were placed in this encounter.    Nirali Magouirk L Mikhaela Zaugg, PA-C 05/08/20  ADDENDUM:  Hematology/oncology Attending: I had a face-to-face encounter with the patient today.  I recommended his care plan.  This is a very pleasant 79 years old African-American male with history of stage IIIb non-small cell lung cancer and the patient was undergoing systemic a course of concurrent chemoradiation with weekly carboplatin and paclitaxel status post 2 cycles.  He missed several weeks of his treatment.  He is currently a resident of a skilled nursing facility.  The patient was here today for evaluation before resuming his treatment and he presented with a complaint of unable to breathe with left-sided chest pain.  His oxygen saturation was 91% (normally 100%).  We recommended for the patient to go immediately to the emergency department for evaluation and to rule out underlying cardiac condition or new pulmonary embolism. The patient also has hypokalemia and he will need potassium replacement during his evaluation at the emergency department. We will arrange for the patient to come back for follow-up visit after discharge to resume his treatment. He was advised to call immediately if he has any concerning symptoms in  the interval.  Disclaimer: This note was dictated with voice recognition software. Similar sounding words can inadvertently be transcribed and may be missed upon review. Eilleen Kempf, MD 05/08/20

## 2020-05-08 ENCOUNTER — Ambulatory Visit
Admission: RE | Admit: 2020-05-08 | Discharge: 2020-05-08 | Disposition: A | Discharge status: Home / Self Care | Payer: Medicare HMO | Source: Ambulatory Visit | Attending: Radiation Oncology | Admitting: Radiation Oncology

## 2020-05-08 ENCOUNTER — Emergency Department (HOSPITAL_COMMUNITY): Payer: Medicare HMO

## 2020-05-08 ENCOUNTER — Ambulatory Visit: Payer: Medicare HMO | Admitting: Gastroenterology

## 2020-05-08 ENCOUNTER — Inpatient Hospital Stay: Payer: Medicare HMO | Attending: Physician Assistant | Admitting: Physician Assistant

## 2020-05-08 ENCOUNTER — Encounter (HOSPITAL_COMMUNITY): Payer: Self-pay

## 2020-05-08 ENCOUNTER — Telehealth: Payer: Self-pay

## 2020-05-08 ENCOUNTER — Emergency Department (HOSPITAL_COMMUNITY)
Admission: EM | Admit: 2020-05-08 | Discharge: 2020-05-09 | Disposition: A | Discharge status: Home / Self Care | Payer: Medicare HMO | Attending: Emergency Medicine | Admitting: Emergency Medicine

## 2020-05-08 ENCOUNTER — Encounter: Payer: Self-pay | Admitting: Nutrition

## 2020-05-08 ENCOUNTER — Inpatient Hospital Stay: Payer: Medicare HMO

## 2020-05-08 ENCOUNTER — Encounter: Payer: Self-pay | Admitting: Physician Assistant

## 2020-05-08 ENCOUNTER — Inpatient Hospital Stay: Payer: Medicare HMO | Admitting: Nutrition

## 2020-05-08 ENCOUNTER — Other Ambulatory Visit: Payer: Self-pay

## 2020-05-08 VITALS — BP 96/84 | HR 60 | Temp 98.2°F | Resp 20 | Ht 76.0 in

## 2020-05-08 DIAGNOSIS — Z87891 Personal history of nicotine dependence: Secondary | ICD-10-CM | POA: Insufficient documentation

## 2020-05-08 DIAGNOSIS — R0602 Shortness of breath: Secondary | ICD-10-CM

## 2020-05-08 DIAGNOSIS — Z20822 Contact with and (suspected) exposure to covid-19: Secondary | ICD-10-CM | POA: Diagnosis not present

## 2020-05-08 DIAGNOSIS — C3431 Malignant neoplasm of lower lobe, right bronchus or lung: Secondary | ICD-10-CM | POA: Diagnosis not present

## 2020-05-08 DIAGNOSIS — E860 Dehydration: Secondary | ICD-10-CM | POA: Diagnosis not present

## 2020-05-08 DIAGNOSIS — Z79899 Other long term (current) drug therapy: Secondary | ICD-10-CM | POA: Insufficient documentation

## 2020-05-08 DIAGNOSIS — E876 Hypokalemia: Secondary | ICD-10-CM

## 2020-05-08 DIAGNOSIS — C3490 Malignant neoplasm of unspecified part of unspecified bronchus or lung: Secondary | ICD-10-CM | POA: Diagnosis not present

## 2020-05-08 DIAGNOSIS — E1165 Type 2 diabetes mellitus with hyperglycemia: Secondary | ICD-10-CM | POA: Diagnosis not present

## 2020-05-08 DIAGNOSIS — R5381 Other malaise: Secondary | ICD-10-CM | POA: Diagnosis not present

## 2020-05-08 DIAGNOSIS — R079 Chest pain, unspecified: Secondary | ICD-10-CM | POA: Insufficient documentation

## 2020-05-08 DIAGNOSIS — Z510 Encounter for antineoplastic radiation therapy: Secondary | ICD-10-CM | POA: Diagnosis not present

## 2020-05-08 DIAGNOSIS — R627 Adult failure to thrive: Secondary | ICD-10-CM | POA: Diagnosis not present

## 2020-05-08 DIAGNOSIS — I10 Essential (primary) hypertension: Secondary | ICD-10-CM | POA: Insufficient documentation

## 2020-05-08 DIAGNOSIS — J9819 Other pulmonary collapse: Secondary | ICD-10-CM | POA: Diagnosis not present

## 2020-05-08 DIAGNOSIS — I700 Atherosclerosis of aorta: Secondary | ICD-10-CM | POA: Diagnosis not present

## 2020-05-08 DIAGNOSIS — J189 Pneumonia, unspecified organism: Secondary | ICD-10-CM

## 2020-05-08 DIAGNOSIS — R0600 Dyspnea, unspecified: Secondary | ICD-10-CM | POA: Diagnosis not present

## 2020-05-08 DIAGNOSIS — Z7982 Long term (current) use of aspirin: Secondary | ICD-10-CM | POA: Diagnosis not present

## 2020-05-08 DIAGNOSIS — R918 Other nonspecific abnormal finding of lung field: Secondary | ICD-10-CM | POA: Diagnosis not present

## 2020-05-08 DIAGNOSIS — C3411 Malignant neoplasm of upper lobe, right bronchus or lung: Secondary | ICD-10-CM | POA: Diagnosis not present

## 2020-05-08 LAB — CBC WITH DIFFERENTIAL (CANCER CENTER ONLY)
Abs Immature Granulocytes: 0.05 10*3/uL (ref 0.00–0.07)
Basophils Absolute: 0 10*3/uL (ref 0.0–0.1)
Basophils Relative: 0 %
Eosinophils Absolute: 0 10*3/uL (ref 0.0–0.5)
Eosinophils Relative: 0 %
HCT: 28.6 % — ABNORMAL LOW (ref 39.0–52.0)
Hemoglobin: 8.4 g/dL — ABNORMAL LOW (ref 13.0–17.0)
Immature Granulocytes: 1 %
Lymphocytes Relative: 12 %
Lymphs Abs: 1.2 10*3/uL (ref 0.7–4.0)
MCH: 23.6 pg — ABNORMAL LOW (ref 26.0–34.0)
MCHC: 29.4 g/dL — ABNORMAL LOW (ref 30.0–36.0)
MCV: 80.3 fL (ref 80.0–100.0)
Monocytes Absolute: 0.6 10*3/uL (ref 0.1–1.0)
Monocytes Relative: 5 %
Neutro Abs: 8.2 10*3/uL — ABNORMAL HIGH (ref 1.7–7.7)
Neutrophils Relative %: 82 %
Platelet Count: 256 10*3/uL (ref 150–400)
RBC: 3.56 MIL/uL — ABNORMAL LOW (ref 4.22–5.81)
RDW: 22.2 % — ABNORMAL HIGH (ref 11.5–15.5)
WBC Count: 10.1 10*3/uL (ref 4.0–10.5)
nRBC: 0 % (ref 0.0–0.2)

## 2020-05-08 LAB — CMP (CANCER CENTER ONLY)
ALT: 31 U/L (ref 0–44)
AST: 29 U/L (ref 15–41)
Albumin: 2 g/dL — ABNORMAL LOW (ref 3.5–5.0)
Alkaline Phosphatase: 68 U/L (ref 38–126)
Anion gap: 12 (ref 5–15)
BUN: 10 mg/dL (ref 8–23)
CO2: 22 mmol/L (ref 22–32)
Calcium: 8.5 mg/dL — ABNORMAL LOW (ref 8.9–10.3)
Chloride: 106 mmol/L (ref 98–111)
Creatinine: 0.84 mg/dL (ref 0.61–1.24)
GFR, Estimated: >60 mL/min (ref >60)
Glucose, Bld: 188 mg/dL — ABNORMAL HIGH (ref 70–99)
Potassium: 2.7 mmol/L — CL (ref 3.5–5.1)
Sodium: 140 mmol/L (ref 135–145)
Total Bilirubin: 1 mg/dL (ref 0.3–1.2)
Total Protein: 6.8 g/dL (ref 6.5–8.1)

## 2020-05-08 LAB — RESPIRATORY PANEL BY RT PCR (FLU A&B, COVID)
Influenza A by PCR: NEGATIVE
Influenza B by PCR: NEGATIVE
SARS Coronavirus 2 by RT PCR: NEGATIVE

## 2020-05-08 LAB — TROPONIN I (HIGH SENSITIVITY)
Troponin I (High Sensitivity): 14 ng/L (ref <18)
Troponin I (High Sensitivity): 13 ng/L (ref <18)

## 2020-05-08 MED ORDER — POTASSIUM CHLORIDE CRYS ER 20 MEQ PO TBCR
40.0000 meq | EXTENDED_RELEASE_TABLET | Freq: Once | ORAL | Status: AC
  Administered 2020-05-08: 40 meq via ORAL
  Filled 2020-05-08: qty 2

## 2020-05-08 MED ORDER — SODIUM CHLORIDE (PF) 0.9 % IJ SOLN
INTRAMUSCULAR | Status: AC
  Filled 2020-05-08: qty 50

## 2020-05-08 MED ORDER — LACTATED RINGERS IV BOLUS
1000.0000 mL | Freq: Once | INTRAVENOUS | Status: AC
  Administered 2020-05-08: 1000 mL via INTRAVENOUS

## 2020-05-08 MED ORDER — POTASSIUM CHLORIDE CRYS ER 20 MEQ PO TBCR: 20.0000 meq | EXTENDED_RELEASE_TABLET | Freq: Two times a day (BID) | ORAL | 0 refills | Status: AC

## 2020-05-08 MED ORDER — DOXYCYCLINE HYCLATE 100 MG PO CAPS: 100.0000 mg | ORAL_CAPSULE | Freq: Two times a day (BID) | ORAL | 0 refills | Status: AC

## 2020-05-08 MED ORDER — IOHEXOL 350 MG/ML SOLN
100.0000 mL | Freq: Once | INTRAVENOUS | Status: AC | PRN
  Administered 2020-05-08: 100 mL via INTRAVENOUS

## 2020-05-08 MED ORDER — POTASSIUM CHLORIDE 10 MEQ/100ML IV SOLN
10.0000 meq | Freq: Once | INTRAVENOUS | Status: AC
  Administered 2020-05-08: 10 meq via INTRAVENOUS
  Filled 2020-05-08: qty 100

## 2020-05-08 NOTE — Telephone Encounter (Signed)
Critical Value: K 2.7 Notified: Yetta Glassman, CMA

## 2020-05-08 NOTE — Progress Notes (Signed)
VAST consulted to obtain IV access. Upon arrival at pt's bedside, ER provider obtained IV access utilizing ultrasound.

## 2020-05-08 NOTE — ED Notes (Signed)
Patient came in heavily soiled/ large black tarry stool Patient cleaned/ external male purewick attached to patient

## 2020-05-08 NOTE — ED Notes (Signed)
Call received from pt daughter [name] MCRF@ [phone] requesting rtn call for pt status/updates when possible. [employer]

## 2020-05-08 NOTE — ED Triage Notes (Signed)
Pt sent from cancer center, arrived today from [facility], at cancer center for treatment, developed some chest pain and SOB. Pt placed on 2L [place] and brought to ED. Pt c/o some left sided chest pain and SOB still, states o2 is helping but still feels some SOB

## 2020-05-08 NOTE — ED Provider Notes (Signed)
  Physical Exam  BP 132/84   Pulse 92   Temp 97.7 F (36.5 C)   Resp (!) 21   SpO2 97%   Physical Exam  ED Course/Procedures     Procedures  MDM   Patient sent in for shortness of breath.  Sent from oncology.  Rule out PE.  Negative CT scan for pulmonary embolism and negative Covid test but CT scan showed possible pneumonia/potential viral pneumonia.  Not hypoxic.  Also hypokalemia.  Will supplement.  Discharge home with outpatient follow-up.      Davonna Belling, MD 05/08/20 [phone]

## 2020-05-08 NOTE — Discharge Instructions (Addendum)
Your CT scan showed possible pneumonia.  Will treat with antibiotics.  Watch for worsening difficulty breathing.  Your Covid test is negative.  Follow-up with your doctors.  There was no pulmonary embolism on your scan. Your potassium was also low.  Take the supplementation.

## 2020-05-08 NOTE — ED Notes (Signed)
Patient vomited/ large/ clear/ yellow Sheets, gown changed

## 2020-05-08 NOTE — Progress Notes (Signed)
Patient complained of new onset chest pain/worsening shortness of breath, critical labs.  MD and PA advised to transport to Emergency Room.  Patient was transported to Room 18, report given to [place].  Patient only belongings clothing and personal wheelchair left in room with patient.    [place] notified of transfer to ER.  Spoke with [employer], LPN.  Report given.

## 2020-05-08 NOTE — Progress Notes (Signed)
RD was scheduled to see patient today during treatment however patient was transferred to the ED to evaluate shortness of breath.  Will reschedule as appropriate.

## 2020-05-08 NOTE — ED Provider Notes (Signed)
[facility] DEPT Provider Note   CSN: [id number] Arrival date & time: 05/08/20  1247     History Chief Complaint  Patient presents with  . Shortness of Breath  . Chest Pain    Scott Conley is a 79 y.o. male.  Patient is an 79 year old male with a history of lung cancer with mets currently on chemoradiation, stroke, DVT who is not currently anticoagulated, hypertension who is presenting today from oncology clinic for chest pain or shortness of breath.  He reports that last night he started having pain in the left side of his chest that went into his arm and made him also feel short of breath like he could not catch his breath.  Patient reports that the pain is still present but not as severe.  He was placed on 2 L of oxygen in the clinic but reports the shortness of breath feels about the same.  He denies any new cough, hemoptysis, fever or sputum production.  He reports he has been generally weak and not been eating or drinking much because he just does not have an appetite.  He has been seen multiple times in the last 1 month for dehydration and poor oral intake.  He denies any unilateral leg pain or swelling.  He has still been having bowel movements and has no abdominal pain at this time.  No prior history of cardiac disease or stent placement.  Patient no longer smokes cigarettes.  The history is provided by the patient and medical records.  Shortness of Breath Severity:  Moderate Onset quality:  Sudden Duration:  1 day Timing:  Constant Progression:  Unchanged Chronicity:  New Associated symptoms: chest pain   Associated symptoms: no cough, no fever, no sputum production and no wheezing   Chest Pain Associated symptoms: shortness of breath   Associated symptoms: no cough and no fever        Past Medical History:  Diagnosis Date  . Aortic atherosclerosis ([place])   . Arthritis   . Blood clot in vein   . CVA (cerebral infarction)   .  Diabetes ([place])   . DVT (deep venous thrombosis) ([place])   . Emphysema lung ([place])   . Hypertension   . Lung cancer ([place])   . Osteoarthritis   . Stroke [facility])    Left hand stiffness     Patient Active Problem List   Diagnosis Date Noted  . Shortness of breath 05/08/2020  . Generalized weakness 04/17/2020  . Dyspnea   . History of lung biopsy   . Abnormal PET scan of colon   . Erosive esophagitis   . Esophageal dysphagia   . Nodule of left lung   . Controlled type 2 diabetes mellitus with hyperglycemia ([place]) 04/08/2020  . Postobstructive pneumonia 04/07/2020  . Goals of care, counseling/discussion 03/20/2020  . Encounter for antineoplastic chemotherapy 03/20/2020  . Malignant neoplasm of bronchus of right lower lobe ([place])   . Hilar mass   . HLD (hyperlipidemia)   . Hypokalemia   . Hyponatremia   . Microcytic anemia   . Medicare annual wellness visit, subsequent 11/21/2016  . Obesity 11/21/2016  . Diabetes ([place]) 11/21/2015  . Hypertension 11/21/2015  . Low back strain 11/21/2015  . Tobacco use 03/01/2014  . Ataxia, late effect of cerebrovascular disease 01/18/2014  . CVA (cerebral infarction) 11/30/2013  . Stroke [facility]) 11/26/2013    Past Surgical History:  Procedure Laterality Date  . APPENDECTOMY    . BIOPSY  03/05/2020   Procedure: BIOPSY;  Surgeon: Garner Nash, DO;  Location: [facility];  Service: Pulmonary;;  . BIOPSY  04/11/2020   Procedure: BIOPSY;  Surgeon: Irving Copas., MD;  Location: [facility];  Service: Gastroenterology;;  . Cydney Ok  03/05/2020   Procedure: CRYOTHERAPY;  Surgeon: Garner Nash, DO;  Location: [facility] ENDOSCOPY;  Service: Pulmonary;;  . CYST REMOVAL LEG     removed from right groin  . ESOPHAGOGASTRODUODENOSCOPY (EGD) WITH PROPOFOL N/A 04/11/2020   Procedure: ESOPHAGOGASTRODUODENOSCOPY (EGD) WITH PROPOFOL;  Surgeon: Rush Landmark Telford Nab., MD;  Location: [facility] ENDOSCOPY;  Service: Gastroenterology;  Laterality: N/A;  . HEMOSTASIS  CONTROL  03/05/2020   Procedure: HEMOSTASIS CONTROL;  Surgeon: Garner Nash, DO;  Location: [facility]OSCOPY;  Service: Pulmonary;;  . LOOP RECORDER IMPLANT  11/29/13   MDT LinQ implanted by Dr Caryl Comes for cryptogenic stroke  . LOOP RECORDER IMPLANT N/A 11/29/2013   Procedure: LOOP RECORDER IMPLANT;  Surgeon: Deboraha Sprang, MD;  Location: [facility] CATH LAB;  Service: Cardiovascular;  Laterality: N/A;  . TEE WITHOUT CARDIOVERSION N/A 11/29/2013   Procedure: TRANSESOPHAGEAL ECHOCARDIOGRAM (TEE);  Surgeon: Sueanne Margarita, MD;  Location: [facility] ENDOSCOPY;  Service: Cardiovascular;  Laterality: N/A;  . TONSILLECTOMY    . VIDEO BRONCHOSCOPY WITH ENDOBRONCHIAL ULTRASOUND N/A 03/05/2020   Procedure: VIDEO BRONCHOSCOPY WITH ENDOBRONCHIAL ULTRASOUND;  Surgeon: Garner Nash, DO;  Location: [facility];  Service: Pulmonary;  Laterality: N/A;       Family History  Problem Relation Age of Onset  . Breast cancer Mother   . Breast cancer Maternal Grandmother   . Cirrhosis Brother     Social History   Tobacco Use  . Smoking status: Former Smoker    Packs/day: 0.50    Years: 58.00    Pack years: 29.00    Types: Cigarettes    Start date: 11/30/2013  . Smokeless tobacco: Never Used  Vaping Use  . Vaping Use: Never used  Substance Use Topics  . Alcohol use: No    Comment: none since 1985; hx alcohol abuse  . Drug use: No    Home Medications Prior to Admission medications   Medication Sig Start Date End Date Taking? Authorizing Provider  arformoterol (BROVANA) 15 MCG/2ML NEBU Take 2 mLs (15 mcg total) by nebulization 2 (two) times daily. 04/15/20   Eugenie Filler, MD  aspirin EC 81 MG tablet Take 1 tablet (81 mg total) by mouth daily. Swallow whole. 03/06/20 03/06/21  Kayleen Memos, DO  atorvastatin (LIPITOR) 80 MG tablet Take 1 tablet (80 mg total) by mouth daily. Must keep appt w/new provider for future refills Patient taking differently: Take 80 mg by mouth daily.  03/05/20   Kayleen Memos, DO  budesonide  (PULMICORT) 0.25 MG/2ML nebulizer solution Take 2 mLs (0.25 mg total) by nebulization 2 (two) times daily. 04/15/20 07/14/20  Eugenie Filler, MD  carvedilol (COREG) 6.25 MG tablet Take 1 tablet (6.25 mg total) by mouth 2 (two) times daily with a meal. 03/05/20   Kayleen Memos, DO  docusate sodium (COLACE) 100 MG capsule Take 200 mg by mouth 2 (two) times daily.    [provider]  feeding supplement, ENSURE ENLIVE, (ENSURE ENLIVE) LIQD Take 237 mLs by mouth 2 (two) times daily between meals. Patient not taking: Reported on 04/29/2020 04/16/20   Eugenie Filler, MD  ferrous gluconate (FERGON) 324 MG tablet Take 324 mg by mouth daily.    [provider]  folic acid (FOLVITE)  1 MG tablet Take 1 tablet (1 mg total) by mouth daily. 04/16/20   Eugenie Filler, MD  guaiFENesin (MUCINEX) 600 MG 12 hr tablet Take 600 mg by mouth 2 (two) times daily.    [provider]  Multiple Vitamin (MULTIVITAMIN WITH MINERALS) TABS tablet Take 1 tablet by mouth daily. 04/16/20   Eugenie Filler, MD  pantoprazole (PROTONIX) 40 MG tablet Take 1 tablet (40 mg total) by mouth 2 (two) times daily. 04/15/20   Eugenie Filler, MD  potassium chloride SA (KLOR-CON) 20 MEQ tablet Take 20 mEq by mouth daily.    [provider]  prochlorperazine (COMPAZINE) 10 MG tablet Take 1 tablet (10 mg total) by mouth every 6 (six) hours as needed for nausea or vomiting. Patient not taking: Reported on 05/08/2020 03/20/20   Curt Bears, MD  sucralfate (CARAFATE) 1 GM/10ML suspension Take 10 mLs (1 g total) by mouth 4 (four) times daily -  with meals and at bedtime. Patient taking differently: Take 10 g by mouth 4 (four) times daily.  04/15/20 05/15/20  Eugenie Filler, MD  traMADol (ULTRAM) 50 MG tablet Take 50 mg by mouth every 12 (twelve) hours as needed for moderate pain.    [provider]    Allergies    Patient has no known allergies.  Review of Systems   Review of Systems    Constitutional: Negative for fever.  Respiratory: Positive for shortness of breath. Negative for cough, sputum production and wheezing.   Cardiovascular: Positive for chest pain.  All other systems reviewed and are negative.   Physical Exam Updated Vital Signs BP 101/63   Pulse 90   Temp (!) 97.2 F (36.2 C) (Oral)   Resp 18   SpO2 98%   Physical Exam Vitals and nursing note reviewed.  Constitutional:      General: He is not in acute distress.    Appearance: He is well-developed. He is obese.  HENT:     Head: Normocephalic and atraumatic.     Mouth/Throat:     Mouth: Mucous membranes are dry.  Eyes:     Pupils: Pupils are equal, round, and reactive to light.     Comments: Pale conjunctive  Cardiovascular:     Rate and Rhythm: Normal rate and regular rhythm.     Pulses: Normal pulses.     Heart sounds: Murmur heard.   Pulmonary:     Effort: Pulmonary effort is normal. No respiratory distress.     Breath sounds: Decreased breath sounds present. No wheezing or rales.  Abdominal:     General: There is no distension.     Palpations: Abdomen is soft.     Tenderness: There is no abdominal tenderness. There is no guarding or rebound.  Musculoskeletal:        General: No tenderness. Normal range of motion.     Cervical back: Normal range of motion and neck supple.  Skin:    General: Skin is warm and dry.     Coloration: Skin is pale.     Findings: No erythema or rash.  Neurological:     General: No focal deficit present.     Mental Status: He is alert and oriented to person, place, and time. Mental status is at baseline.     Comments: No unilateral weakness or numbness  Psychiatric:        Mood and Affect: Mood normal.        Behavior: Behavior normal.  Thought Content: Thought content normal.     ED Results / Procedures / Treatments   Labs (all labs ordered are listed, but only abnormal results are displayed) Labs Reviewed  TROPONIN I (HIGH SENSITIVITY)     EKG EKG Interpretation  Date/Time:  Monday May 08 2020 13:05:36 EDT Ventricular Rate:  94 PR Interval:    QRS Duration: 117 QT Interval:  443 QTC Calculation: 554 R Axis:   -37 Text Interpretation: Sinus rhythm Nonspecific IVCD with LAD Abnormal T, consider ischemia, diffuse leads Artifact Confirmed by Blanchie Dessert [phone]) on 05/08/2020 1:48:47 PM   Radiology DG Chest Port 1 View  Result Date: 05/08/2020 CLINICAL DATA:  Shortness of breath, known lung cancer EXAM: PORTABLE CHEST 1 VIEW COMPARISON:  04/29/2020 FINDINGS: Right hilar mass is again identified reflecting known malignancy. There is persistent right lower lobe collapse. No new lung findings. Stable cardiomediastinal contours with normal heart size. No pleural effusion or pneumothorax. Small sclerotic lesion of posterior left seventh rib is again identified. IMPRESSION: No significant change since 04/29/2020. Right hilar mass and right lower lobe collapse are again identified. Electronically Signed   By: Macy Mis M.D.   On: 05/08/2020 13:27    Procedures Ultrasound ED Peripheral IV (Provider)  Date/Time: 05/08/2020 3:25 PM Performed by: Blanchie Dessert, MD Authorized by: Blanchie Dessert, MD   Procedure details:    Indications: hydration and multiple failed IV attempts     Skin Prep: chlorhexidine gluconate     Location:  Right AC   Angiocath:  20 G   Bedside Ultrasound Guided: Yes     Images: not archived     Patient tolerated procedure without complications: Yes     Dressing applied: Yes     (including critical care time)  Medications Ordered in ED Medications  lactated ringers bolus 1,000 mL (has no administration in time range)    ED Course  I have reviewed the triage vital signs and the nursing notes.  Pertinent labs & imaging results that were available during my care of the patient were reviewed by me and considered in my medical decision making (see chart for details).    MDM  Rules/Calculators/A&P                          Elderly male with multiple medical problems including cancer currently on chemotherapy presenting with left-sided chest pain or shortness of breath.  The pain is been present since last night.  Denies any new infectious symptoms but was noted in clinic to have an oxygen saturation of 91% when he normally is 100%.  Patient does not wear oxygen at home.  Upon arrival here patient is wearing 2 L of oxygen is satting 98%.  Vital signs show a pulse rate of 90 and a normal blood pressure.  Patient has been seen several times for dehydration and poor oral intake which she does endorse again today.  There is no evidence of fluid overload at this time.  Patient has not been eating or drinking well and do suspect a degree of dehydration.  Chest x-ray without evidence of pleural effusion or new pneumonia.  However concern for possible PE as the cause of his chest pain and shortness of breath.  Patient will need a CT PE scan for further evaluation.  He did have lab work done in clinic today which showed hypokalemia of 2.7 with normal creatinine of 0.84 and normal LFTs.  CBC with stable anemia with  a hemoglobin of 8.4 normal platelet count and white blood cell count.  Chest x-ray today is unchanged from prior.  EKG has significant artifact and will need to be repeated.  Will give patient IV fluids.  Will need to replace potassium and CTA pending.  Final Clinical Impression(s) / ED Diagnoses Final diagnoses:  None    Rx / DC Orders ED Discharge Orders    None       Blanchie Dessert, MD 05/08/20 1526

## 2020-05-09 ENCOUNTER — Ambulatory Visit: Payer: Medicare HMO

## 2020-05-09 DIAGNOSIS — W19XXXA Unspecified fall, initial encounter: Secondary | ICD-10-CM | POA: Diagnosis not present

## 2020-05-09 DIAGNOSIS — Z7401 Bed confinement status: Secondary | ICD-10-CM | POA: Diagnosis not present

## 2020-05-09 DIAGNOSIS — M2550 Pain in unspecified joint: Secondary | ICD-10-CM | POA: Diagnosis not present

## 2020-05-09 DIAGNOSIS — R0602 Shortness of breath: Secondary | ICD-10-CM | POA: Diagnosis not present

## 2020-05-09 NOTE — ED Notes (Signed)
Discharged no concerns at this time.  °

## 2020-05-10 ENCOUNTER — Ambulatory Visit
Admission: RE | Admit: 2020-05-10 | Discharge: 2020-05-10 | Disposition: A | Discharge status: Home / Self Care | Payer: Medicare HMO | Source: Ambulatory Visit | Attending: Radiation Oncology | Admitting: Radiation Oncology

## 2020-05-10 ENCOUNTER — Other Ambulatory Visit: Payer: Self-pay

## 2020-05-10 ENCOUNTER — Ambulatory Visit: Payer: Medicare HMO

## 2020-05-10 ENCOUNTER — Encounter: Payer: Self-pay | Admitting: Radiation Oncology

## 2020-05-10 ENCOUNTER — Other Ambulatory Visit: Payer: Self-pay | Admitting: Radiation Oncology

## 2020-05-10 ENCOUNTER — Telehealth: Payer: Self-pay

## 2020-05-10 DIAGNOSIS — C3431 Malignant neoplasm of lower lobe, right bronchus or lung: Secondary | ICD-10-CM | POA: Diagnosis not present

## 2020-05-10 DIAGNOSIS — R911 Solitary pulmonary nodule: Secondary | ICD-10-CM | POA: Diagnosis not present

## 2020-05-10 DIAGNOSIS — R531 Weakness: Secondary | ICD-10-CM | POA: Diagnosis not present

## 2020-05-10 DIAGNOSIS — Z510 Encounter for antineoplastic radiation therapy: Secondary | ICD-10-CM | POA: Diagnosis not present

## 2020-05-10 DIAGNOSIS — C3411 Malignant neoplasm of upper lobe, right bronchus or lung: Secondary | ICD-10-CM | POA: Diagnosis not present

## 2020-05-10 NOTE — Progress Notes (Signed)
Called patient and left detailed voicemail on documentation to bring by to qualify for the [employer]. Left my name and number in case the patient had any questions.

## 2020-05-10 NOTE — Telephone Encounter (Signed)
Okay to consider palliative care and even hospice if needed.  Thank you.

## 2020-05-10 NOTE — Telephone Encounter (Signed)
Pt daughter called Palliative care "because he cannot walk" and is weak. Pt has appt and transportation on 05/15/20 and daughter cannot attend. Please advise regarding sx.

## 2020-05-11 ENCOUNTER — Ambulatory Visit: Payer: Medicare HMO

## 2020-05-11 ENCOUNTER — Telehealth: Payer: Self-pay | Admitting: General Practice

## 2020-05-11 ENCOUNTER — Encounter: Payer: Self-pay | Admitting: General Practice

## 2020-05-11 NOTE — Progress Notes (Signed)
[place] CSW Progress Notes  Referral received from [place] PA, patient expressed concerns w transportation and finances.  Spoke w daughter [name].  She states that transportation concerns have been resolved - he is now at his home and they have scheduled wheelchair transport via [employer].  Family is concerned that patient is becoming weaker and may not be able to tolerate chemotherapy.  They would like to discuss this w oncologist, message sent.  Daughter [name] does not know what services were arranged at discharge from SNF.  Family has some limited caregiver hours in the home on a private pay basis and daughter [name] checks on patient daily.  They were advised to inquire w his Medicare carrier to determine if any in home caregiver support was available under the terms of his plan.  At SNF discharge, he was referred to [facility] for [facility] needs.   Edwyna Shell, LCSW Clinical Social Worker Phone:  [phone]

## 2020-05-11 NOTE — Telephone Encounter (Signed)
[place] CSW Progress Notes  Call to patient (then hsi daughters) to address concerns from distress screen re transportation and finances.  Left VMs requesting call back.  Per [employer], they are aware that he is at home and wheelchair transport is ongoing for his [place] appts.  They have had no problems with his transport when he is at home, there were some difficulties when he was at [facility].  Financial Advocate A Troutman has left VM w patient re Advertising account executive.  Awaiting return calls from family or patient.  Edwyna Shell, LCSW Clinical Social Worker Phone:  [phone]

## 2020-05-11 NOTE — Telephone Encounter (Signed)
I have LM for Langley Gauss and advised as indicated regarding this pt.

## 2020-05-12 ENCOUNTER — Other Ambulatory Visit: Payer: Self-pay

## 2020-05-12 ENCOUNTER — Ambulatory Visit: Payer: Medicare HMO

## 2020-05-12 ENCOUNTER — Ambulatory Visit
Admission: RE | Admit: 2020-05-12 | Discharge: 2020-05-12 | Disposition: A | Discharge status: Home / Self Care | Payer: Medicare HMO | Source: Ambulatory Visit | Attending: Radiation Oncology | Admitting: Radiation Oncology

## 2020-05-12 DIAGNOSIS — R1312 Dysphagia, oropharyngeal phase: Secondary | ICD-10-CM | POA: Diagnosis not present

## 2020-05-12 DIAGNOSIS — E119 Type 2 diabetes mellitus without complications: Secondary | ICD-10-CM | POA: Diagnosis not present

## 2020-05-12 DIAGNOSIS — J189 Pneumonia, unspecified organism: Secondary | ICD-10-CM | POA: Diagnosis not present

## 2020-05-12 DIAGNOSIS — D701 Agranulocytosis secondary to cancer chemotherapy: Secondary | ICD-10-CM | POA: Diagnosis not present

## 2020-05-12 DIAGNOSIS — L89156 Pressure-induced deep tissue damage of sacral region: Secondary | ICD-10-CM | POA: Diagnosis not present

## 2020-05-12 DIAGNOSIS — C3411 Malignant neoplasm of upper lobe, right bronchus or lung: Secondary | ICD-10-CM | POA: Diagnosis not present

## 2020-05-12 DIAGNOSIS — D630 Anemia in neoplastic disease: Secondary | ICD-10-CM | POA: Diagnosis not present

## 2020-05-12 DIAGNOSIS — I69393 Ataxia following cerebral infarction: Secondary | ICD-10-CM | POA: Diagnosis not present

## 2020-05-12 DIAGNOSIS — I10 Essential (primary) hypertension: Secondary | ICD-10-CM | POA: Diagnosis not present

## 2020-05-12 DIAGNOSIS — Z510 Encounter for antineoplastic radiation therapy: Secondary | ICD-10-CM | POA: Diagnosis not present

## 2020-05-12 DIAGNOSIS — C3431 Malignant neoplasm of lower lobe, right bronchus or lung: Secondary | ICD-10-CM | POA: Diagnosis not present

## 2020-05-12 MED FILL — Dexamethasone Sodium Phosphate Inj 100 MG/10ML: INTRAMUSCULAR | Qty: 2 | Status: AC

## 2020-05-12 NOTE — Progress Notes (Deleted)
[facility] OFFICE PROGRESS NOTE  [place], FNP [address] [place] [address]  DIAGNOSIS: Stage IIIB (T3, N2, M0) Non-Small Cell Lung Cancer, Adenocarcinoma. He presented with a right paratracheal mass, nodal pleural thickening posterior left lung apex. There was marked focal hypermetabolism in the right colon, in the region the ileocecal valveas well as hypermetabolism in the distal esophagus which may be related to esophagitis but cannot exclude neoplasm. He was diagnosed in August 2021.   PDL1 Expression:100%  Molecular Biomarkers: Microsatellite status - MS-Stable Tumor Mutational Burden - 4 Muts/Mb Genomic Findings For a complete list of the genes assayed, please refer to the Appendix. KRAS G12V CDKN2A/B p16INK4a W110* and p14ARF G125R JAK2 V617F TERT promoter -124C>T TP53 W91* 7 Disease relevant genes with no reportable alterations: ALK, BRAF, EGFR, ERBB2, MET, RET, ROS1  PRIOR THERAPY: None  CURRENT THERAPY: Concurrent chemoradiation with carboplatin for an AUC of 2 and paclitaxel 45 mg/m2. First dose expected on 03/28/20.Status post 2 cycles.   INTERVAL HISTORY: Scott Conley 79 y.o. male returns to the clinic today for follow-up visit.  The patient has not received any chemotherapy since 04/04/2020 due to refusing treatment due to his weakness.  He has been receiving his daily radiation as planned.  Did return to the clinic last week for consideration of chemotherapy; however, the patient was saying that he could not "hardly breathe".  He was then seen in the emergency room for a PE rule out.  He was treated for possible pneumonia.  He was Covid negative.  No evidence of PE on CT angiogram.  He was discharged.  He was also seen in the emergency room several times over the course of the last month and a half.  He was seen in the emergency room from 04/17/2020-04/20/2020 for weakness and dehydration. The patient was discharged to a SNF at  [facility]. He again was seen in the emergency room on 04/29/2020 for rib pain shortness of breath and trouble swallowing. He received IV fluids and was referred to GI. He had his staging PET scan which noted focal hypermetabolism in the colonas well as hypermetabolism in the esophagus which may be related to esophagitis, but cannot exclude neoplasm. He was referred to [facility] had an appointment; however, he was unable to make that appointment time due to a scheduling conflict. He had not rescheduled.  The patient currently is a resident at [facility].  He is followed by social work for his transportation and financial concerns.  The patient's family is concerned because he is so weak and cannot walk.  The patient denies any fever, chills, or night sweats.  Weight loss?Marland Kitchen  The patient denies hemoptysis but reports cough, shortness of breath, and occasional chest discomfort.  He denies any nausea, vomiting, diarrhea, or constipation.  He denies any headache or visual changes.  The patient is here today for evaluation and consideration of starting cycle #3 of his weekly dose reduced chemotherapy.  MEDICAL HISTORY: Past Medical History:  Diagnosis Date  . Aortic atherosclerosis ([place])   . Arthritis   . Blood clot in vein   . CVA (cerebral infarction)   . Diabetes ([place])   . DVT (deep venous thrombosis) ([place])   . Emphysema lung ([place])   . Hypertension   . Lung cancer ([place])   . Osteoarthritis   . Stroke [facility])    Left hand stiffness     ALLERGIES:  has No Known Allergies.  MEDICATIONS:  Current Outpatient Medications  Medication Sig Dispense Refill  . arformoterol (BROVANA) 15 MCG/2ML NEBU Take 2 mLs (15 mcg total) by nebulization 2 (two) times daily. 120 mL 1  . aspirin EC 81 MG tablet Take 1 tablet (81 mg total) by mouth daily. Swallow whole. 360 tablet 0  . atorvastatin (LIPITOR) 80 MG tablet Take 1 tablet (80 mg total) by mouth daily. Must keep appt w/new provider for future refills  (Patient taking differently: Take 80 mg by mouth at bedtime. ) 30 tablet 0  . budesonide (PULMICORT) 0.25 MG/2ML nebulizer solution Take 2 mLs (0.25 mg total) by nebulization 2 (two) times daily. 360 each 0  . carvedilol (COREG) 6.25 MG tablet Take 1 tablet (6.25 mg total) by mouth 2 (two) times daily with a meal. 60 tablet 0  . docusate sodium (COLACE) 100 MG capsule Take 200 mg by mouth 2 (two) times daily.    Marland Kitchen doxycycline (VIBRAMYCIN) 100 MG capsule Take 1 capsule (100 mg total) by mouth 2 (two) times daily. 20 capsule 0  . feeding supplement, ENSURE ENLIVE, (ENSURE ENLIVE) LIQD Take 237 mLs by mouth 2 (two) times daily between meals. (Patient not taking: Reported on 04/29/2020) 237 mL 12  . ferrous gluconate (FERGON) 324 MG tablet Take 324 mg by mouth daily.    . folic acid (FOLVITE) 1 MG tablet Take 1 tablet (1 mg total) by mouth daily.    Marland Kitchen guaiFENesin (MUCINEX) 600 MG 12 hr tablet Take 600 mg by mouth 2 (two) times daily.    . Multiple Vitamin (MULTIVITAMIN WITH MINERALS) TABS tablet Take 1 tablet by mouth daily.    . pantoprazole (PROTONIX) 40 MG tablet Take 1 tablet (40 mg total) by mouth 2 (two) times daily. 60 tablet 1  . potassium chloride SA (KLOR-CON) 20 MEQ tablet Take 1 tablet (20 mEq total) by mouth 2 (two) times daily. 8 tablet 0  . prochlorperazine (COMPAZINE) 10 MG tablet Take 1 tablet (10 mg total) by mouth every 6 (six) hours as needed for nausea or vomiting. (Patient taking differently: Take 10 mg by mouth every 6 (six) hours as needed for nausea or vomiting. For nausea /vomiting) 30 tablet 0  . sucralfate (CARAFATE) 1 GM/10ML suspension Take 10 mLs (1 g total) by mouth 4 (four) times daily -  with meals and at bedtime. (Patient taking differently: Take 1 g by mouth 4 (four) times daily. ) 1200 mL 0  . traMADol (ULTRAM) 50 MG tablet Take 50 mg by mouth every 12 (twelve) hours as needed for moderate pain.     No current facility-administered medications for this visit.     SURGICAL HISTORY:  Past Surgical History:  Procedure Laterality Date  . APPENDECTOMY    . BIOPSY  03/05/2020   Procedure: BIOPSY;  Surgeon: Garner Nash, DO;  Location: [facility]COPY;  Service: Pulmonary;;  . BIOPSY  04/11/2020   Procedure: BIOPSY;  Surgeon: Irving Copas., MD;  Location: [facility];  Service: Gastroenterology;;  . Cydney Ok  03/05/2020   Procedure: CRYOTHERAPY;  Surgeon: Garner Nash, DO;  Location: [facility] ENDOSCOPY;  Service: Pulmonary;;  . CYST REMOVAL LEG     removed from right groin  . ESOPHAGOGASTRODUODENOSCOPY (EGD) WITH PROPOFOL N/A 04/11/2020   Procedure: ESOPHAGOGASTRODUODENOSCOPY (EGD) WITH PROPOFOL;  Surgeon: Rush Landmark Telford Nab., MD;  Location: [facility] ENDOSCOPY;  Service: Gastroenterology;  Laterality: N/A;  . HEMOSTASIS CONTROL  03/05/2020   Procedure: HEMOSTASIS CONTROL;  Surgeon: Garner Nash, DO;  Location: [facility]  ENDOSCOPY;  Service: Pulmonary;;  . LOOP RECORDER IMPLANT  11/29/13   MDT LinQ implanted by Dr Klein for cryptogenic stroke  . LOOP RECORDER IMPLANT N/A 11/29/2013   Procedure: LOOP RECORDER IMPLANT;  Surgeon: Steven C Klein, MD;  Location: [facility] CATH LAB;  Service: Cardiovascular;  Laterality: N/A;  . TEE WITHOUT CARDIOVERSION N/A 11/29/2013   Procedure: TRANSESOPHAGEAL ECHOCARDIOGRAM (TEE);  Surgeon: Traci R Turner, MD;  Location: [facility] ENDOSCOPY;  Service: Cardiovascular;  Laterality: N/A;  . TONSILLECTOMY    . VIDEO BRONCHOSCOPY WITH ENDOBRONCHIAL ULTRASOUND N/A 03/05/2020   Procedure: VIDEO BRONCHOSCOPY WITH ENDOBRONCHIAL ULTRASOUND;  Surgeon: Icard, Bradley L, DO;  Location: [facility] ENDOSCOPY;  Service: Pulmonary;  Laterality: N/A;    REVIEW OF SYSTEMS:   Review of Systems  Constitutional: Negative for appetite change, chills, fatigue, fever and unexpected weight change.  HENT:   Negative for mouth sores, nosebleeds, sore throat and trouble swallowing.   Eyes: Negative for eye problems and icterus.  Respiratory: Negative for cough,  hemoptysis, shortness of breath and wheezing.   Cardiovascular: Negative for chest pain and leg swelling.  Gastrointestinal: Negative for abdominal pain, constipation, diarrhea, nausea and vomiting.  Genitourinary: Negative for bladder incontinence, difficulty urinating, dysuria, frequency and hematuria.   Musculoskeletal: Negative for back pain, gait problem, neck pain and neck stiffness.  Skin: Negative for itching and rash.  Neurological: Negative for dizziness, extremity weakness, gait problem, headaches, light-headedness and seizures.  Hematological: Negative for adenopathy. Does not bruise/bleed easily.  Psychiatric/Behavioral: Negative for confusion, depression and sleep disturbance. The patient is not nervous/anxious.     PHYSICAL EXAMINATION:  There were no vitals taken for this visit.  ECOG PERFORMANCE STATUS: {CHL ONC ECOG PS:[id number]}  Physical Exam  Constitutional: Oriented to person, place, and time and well-developed, well-nourished, and in no distress. No distress.  HENT:  Head: Normocephalic and atraumatic.  Mouth/Throat: Oropharynx is clear and moist. No oropharyngeal exudate.  Eyes: Conjunctivae are normal. Right eye exhibits no discharge. Left eye exhibits no discharge. No scleral icterus.  Neck: Normal range of motion. Neck supple.  Cardiovascular: Normal rate, regular rhythm, normal heart sounds and intact distal pulses.   Pulmonary/Chest: Effort normal and breath sounds normal. No respiratory distress. No wheezes. No rales.  Abdominal: Soft. Bowel sounds are normal. Exhibits no distension and no mass. There is no tenderness.  Musculoskeletal: Normal range of motion. Exhibits no edema.  Lymphadenopathy:    No cervical adenopathy.  Neurological: Alert and oriented to person, place, and time. Exhibits normal muscle tone. Gait normal. Coordination normal.  Skin: Skin is warm and dry. No rash noted. Not diaphoretic. No erythema. No pallor.  Psychiatric: Mood, memory  and judgment normal.  Vitals reviewed.  LABORATORY DATA: Lab Results  Component Value Date   WBC 10.1 05/08/2020   HGB 8.4 (L) 05/08/2020   HCT 28.6 (L) 05/08/2020   MCV 80.3 05/08/2020   PLT 256 05/08/2020      Chemistry      Component Value Date/Time   NA 140 05/08/2020 1059   K 2.7 (LL) 05/08/2020 1059   CL 106 05/08/2020 1059   CO2 22 05/08/2020 1059   BUN 10 05/08/2020 1059   CREATININE 0.84 05/08/2020 1059      Component Value Date/Time   CALCIUM 8.5 (L) 05/08/2020 1059   ALKPHOS 68 05/08/2020 1059   AST 29 05/08/2020 1059   ALT 31 05/08/2020 1059   BILITOT 1.0 05/08/2020 1059       RADIOGRAPHIC STUDIES:  CT Chest W   Contrast  Result Date: 04/29/2020 CLINICAL DATA:  Ribcage, intercostal pain for 2 weeks, lung cancer, current chemotherapy EXAM: CT CHEST, ABDOMEN, AND PELVIS WITH CONTRAST TECHNIQUE: Multidetector CT imaging of the chest, abdomen and pelvis was performed following the standard protocol during bolus administration of intravenous contrast. CONTRAST:  162m OMNIPAQUE IOHEXOL 300 MG/ML  SOLN COMPARISON:  CT chest angiogram, 04/07/2020, PET-CT, 03/22/2020 FINDINGS: CT CHEST FINDINGS Cardiovascular: Aortic atherosclerosis. Normal heart size. Three-vessel coronary artery calcifications. No pericardial effusion. Mediastinum/Nodes: No remaining enlarged mediastinal, hilar, or axillary lymph nodes. Small hiatal hernia. Thyroid gland, trachea, and esophagus demonstrate no significant findings. Lungs/Pleura: Right hilar mass is slightly decreased in size compared to prior examination, measuring 6.0 x 5.9 cm, previously 7.4 x 6.7 cm when measured similarly (series 2, image 39). There is redemonstrated obstruction of the right lower lobe bronchus with total obstructive atelectasis of the right lower lobe. Unchanged subpleural nodule of the posteromedial left pulmonary apex measuring 1.2 x 1.1 cm (series 4, image 8). Mild centrilobular and paraseptal emphysema.  Musculoskeletal: No chest wall mass or suspicious bone lesions identified. CT ABDOMEN PELVIS FINDINGS Hepatobiliary: No solid liver abnormality is seen. No gallstones, gallbladder wall thickening, or biliary dilatation. Pancreas: Unremarkable. No pancreatic ductal dilatation or surrounding inflammatory changes. Spleen: Normal in size without significant abnormality. Adrenals/Urinary Tract: Adrenal glands are unremarkable. Kidneys are normal, without renal calculi, solid lesion, or hydronephrosis. Bladder is unremarkable. Stomach/Bowel: Stomach is within normal limits. Appendix appears normal. There is a sizable ileocecal intussusception, the lead point of which is likely a intramural lipoma of the terminal ileum more clearly demonstrated on prior examination (series 5, image 80). Descending and sigmoid diverticulosis. No evidence of bowel wall thickening, distention, or inflammatory changes. Vascular/Lymphatic: Aortic atherosclerosis. 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 No enlarged abdominal or pelvic lymph nodes. Reproductive: No mass or other abnormality. Other: No abdominal wall hernia or abnormality. No abdominopelvic ascites. Musculoskeletal: No acute or significant osseous findings. IMPRESSION: 1. No acute findings to explain right-sided chest pain. 2. Right hilar mass is slightly decreased in size compared to prior examination, consistent with treatment response. There is redemonstrated obstruction of the right lower lobe bronchus with total obstructive atelectasis of the right lower lobe. 3. Unchanged subpleural nodule of the posteromedial left pulmonary apex measuring 1.2 x 1.1 cm, previously PET avid and concerning for metastatic nodule versus synchronous primary lesion. 4. No evidence of distant metastatic disease in the abdomen or pelvis. 5. There is a sizable ileocecal intussusception,  the lead point of which is likely an intramural lipoma of the terminal ileum more clearly demonstrated on prior examination. Correlate for acutely referable symptoms and consider colonoscopy to further evaluate and exclude colon malignancy. 6. 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 Recommend vascular referral on a nonemergent basis. Aortic Atherosclerosis (ICD10-I70.0). 7. Coronary artery disease. 8. Emphysema (ICD10-J43.9). Electronically Signed   By: AEddie CandleM.D.   On: 04/29/2020 19:53   CT Angio Chest PE W and/or Wo Contrast  Result Date: 05/08/2020 CLINICAL DATA:  Metastatic lung cancer, undergoing chemo radiation therapy, acute dyspnea EXAM: CT ANGIOGRAPHY CHEST WITH CONTRAST TECHNIQUE: Multidetector CT imaging of the chest was performed using the standard protocol during bolus administration of intravenous contrast. Multiplanar CT image reconstructions  and MIPs were obtained to evaluate the vascular anatomy. CONTRAST:  100mL OMNIPAQUE IOHEXOL 350 MG/ML SOLN COMPARISON:  04/07/2020 FINDINGS: Cardiovascular: There is excellent opacification of the pulmonary arterial tree. No intraluminal filling defect is seen to suggest acute pulmonary embolism. The central pulmonary arteries are of normal caliber. Mild coronary artery calcification. Global cardiac size within normal limits. No pericardial effusion. The thoracic aorta is of normal caliber. Mild scattered atherosclerotic calcification is seen throughout the thoracic aorta. Mediastinum/Nodes: The right hilar mass appears slightly decreased in size measuring 6.3 x 5.9 cm at axial image # 94/7. This appears confluent with the hilar nodal structures. No additional pathologic adenopathy within the thorax. Moderate hiatal hernia is again identified with radiodense material within the herniated gastric lumen.  6 mm right thyroid nodule is un likely of clinical significance. No further follow-up is required for this finding. Lungs/Pleura: Moderate centrilobular emphysema. Chronic right lower lobe collapse is again noted. Stable atelectasis within the right middle lobe. Spiculated pleural based opacity at the left apex is unchanged, possibly representing the sequela of prior therapy. This measures 13 mm at axial image # 29/9. There has developed new rounded ground-glass pulmonary infiltrate peripherally within the left lower lobe, possibly infectious or inflammatory. No pneumothorax or pleural effusion. Upper Abdomen: No acute abnormality. Musculoskeletal: No lytic or blastic bone lesions are seen. Review of the MIP images confirms the above findings. IMPRESSION: No pulmonary embolism. Slight interval decrease in right hilar neoplasm confluent with the right hilar nodal structures. Stable chronic collapse of the right lower lobe and atelectasis within the right middle lobe. Stable spiculated pleural based opacity at the left apex, possibly representing the sequela of treated disease. New focal ground-glass pulmonary infiltrate within the peripheral left lower lobe. This is likely infectious or inflammatory in nature. Serologic correlation for COVID-19 pneumonia may be helpful. Aortic Atherosclerosis (ICD10-I70.0). Emphysema (ICD10-J43.9). Electronically Signed   By: Ashesh  Parikh MD   On: 05/08/2020 19:56   CT Abdomen Pelvis W Contrast  Result Date: 04/29/2020 CLINICAL DATA:  Ribcage, intercostal pain for 2 weeks, lung cancer, current chemotherapy EXAM: CT CHEST, ABDOMEN, AND PELVIS WITH CONTRAST TECHNIQUE: Multidetector CT imaging of the chest, abdomen and pelvis was performed following the standard protocol during bolus administration of intravenous contrast. CONTRAST:  100mL OMNIPAQUE IOHEXOL 300 MG/ML  SOLN COMPARISON:  CT chest angiogram, 04/07/2020, PET-CT, 03/22/2020 FINDINGS: CT CHEST FINDINGS Cardiovascular:  Aortic atherosclerosis. Normal heart size. Three-vessel coronary artery calcifications. No pericardial effusion. Mediastinum/Nodes: No remaining enlarged mediastinal, hilar, or axillary lymph nodes. Small hiatal hernia. Thyroid gland, trachea, and esophagus demonstrate no significant findings. Lungs/Pleura: Right hilar mass is slightly decreased in size compared to prior examination, measuring 6.0 x 5.9 cm, previously 7.4 x 6.7 cm when measured similarly (series 2, image 39). There is redemonstrated obstruction of the right lower lobe bronchus with total obstructive atelectasis of the right lower lobe. Unchanged subpleural nodule of the posteromedial left pulmonary apex measuring 1.2 x 1.1 cm (series 4, image 8). Mild centrilobular and paraseptal emphysema. Musculoskeletal: No chest wall mass or suspicious bone lesions identified. CT ABDOMEN PELVIS FINDINGS Hepatobiliary: No solid liver abnormality is seen. No gallstones, gallbladder wall thickening, or biliary dilatation. Pancreas: Unremarkable. No pancreatic ductal dilatation or surrounding inflammatory changes. Spleen: Normal in size without significant abnormality. Adrenals/Urinary Tract: Adrenal glands are unremarkable. Kidneys are normal, without renal calculi, solid lesion, or hydronephrosis. Bladder is unremarkable. Stomach/Bowel: Stomach is within normal limits. Appendix appears normal. There is a sizable ileocecal intussusception, the lead   point of which is likely a intramural lipoma of the terminal ileum more clearly demonstrated on prior examination (series 5, image 80). Descending and sigmoid diverticulosis. No evidence of bowel wall thickening, distention, or inflammatory changes. Vascular/Lymphatic: Aortic atherosclerosis. 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 No enlarged abdominal or pelvic lymph nodes. Reproductive: No mass or other  abnormality. Other: No abdominal wall hernia or abnormality. No abdominopelvic ascites. Musculoskeletal: No acute or significant osseous findings. IMPRESSION: 1. No acute findings to explain right-sided chest pain. 2. Right hilar mass is slightly decreased in size compared to prior examination, consistent with treatment response. There is redemonstrated obstruction of the right lower lobe bronchus with total obstructive atelectasis of the right lower lobe. 3. Unchanged subpleural nodule of the posteromedial left pulmonary apex measuring 1.2 x 1.1 cm, previously PET avid and concerning for metastatic nodule versus synchronous primary lesion. 4. No evidence of distant metastatic disease in the abdomen or pelvis. 5. There is a sizable ileocecal intussusception, the lead point of which is likely an intramural lipoma of the terminal ileum more clearly demonstrated on prior examination. Correlate for acutely referable symptoms and consider colonoscopy to further evaluate and exclude colon malignancy. 6. 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 Recommend vascular referral on a nonemergent basis. Aortic Atherosclerosis (ICD10-I70.0). 7. Coronary artery disease. 8. Emphysema (ICD10-J43.9). Electronically Signed   By: Eddie Candle M.D.   On: 04/29/2020 19:53   DG Chest Port 1 View  Result Date: 05/08/2020 CLINICAL DATA:  Shortness of breath, known lung cancer EXAM: PORTABLE CHEST 1 VIEW COMPARISON:  04/29/2020 FINDINGS: Right hilar mass is again identified reflecting known malignancy. There is persistent right lower lobe collapse. No new lung findings. Stable cardiomediastinal contours with normal heart size. No pleural effusion or pneumothorax. Small sclerotic lesion of posterior left seventh rib is again identified. IMPRESSION: No significant change since 04/29/2020.  Right hilar mass and right lower lobe collapse are again identified. Electronically Signed   By: Macy Mis M.D.   On: 05/08/2020 13:27   DG Chest Port 1 View  Result Date: 04/29/2020 CLINICAL DATA:  Cough, rib pain HX 2 weeks. Lung cancer history undergoing chemo. Diabetes, hypertension, stroke, former smoker. EXAM: PORTABLE CHEST 1 VIEW COMPARISON:  Chest x-ray 04/12/2020, CT chest 04/07/2020, chest x-ray 11/27/2013 FINDINGS: Loop recorder noted overlying the left chest. Cardiomediastinal silhouette is unchanged. Partially visualized right hilar mass measuring at least 5.5 x 6.8 cm consistent with known lung malignancy. Associated right lower lobe collapse. Redemonstration of compressive atelectasis within the right middle lobe. Otherwise no focal consolidation. No pulmonary edema. No pleural effusion. No pneumothorax. No acute osseous abnormality. Redemonstration of a sclerotic lesion of the seventh left posterior rib (finding stable from the 2021 studies but appears to be new compared to 2015 study). IMPRESSION: 1. Right hilar mass consistent with known lung malignancy with associated right lower lobe collapse. Cannot exclude underlying infection/inflammation within the right lower lobe. 2. No new cardiopulmonary abnormality. 3. Indeterminate similar-appearing sclerotic lesion of the seventh left posterior rib. Electronically Signed   By: Iven Finn M.D.   On: 04/29/2020 15:50     ASSESSMENT/PLAN:  This is a very pleasant 79 year old African American male with stage IIIB (T3, N2, M0) Non-Small  Cell Lung Cancer, Adenocarcinoma. He presented with a right paratracheal mass, nodal pleural thickening in the posterior left lung apex. There was marked focal hypermetabolism in the right colon, in the region of the ileocecal valveas well as distal esophagus which may be related to esophagitis but neoplasm cannot be excluded. He was diagnosed in August 2021. His PDL1 expression is100%and he has no  actionable mutations.   He is scheduled to begin concurrent chemoradiation with carboplatin for an AUC if 2 and paclitaxel 45 mg/m2 weekly. He is status post two cycles. The patient has not had treatment in several weeks and had refused treatment. He is currently residing in a SNF at [facility].  The patient was seen with Dr. Mohammed.  Labs were reviewed.  Dr. Mohammed had a lengthy discussion with the patient about his current condition discussion about continuing treatment versus referral to palliative care versus hospice.  The patient wishes to _   He will proceed with cycle number _today as scheduled.   We will see him back for follow-up visit in 2 weeks for evaluation before starting cycle #5.   Recommend he follow up with GI referral as well on an outpatient basis.   The patient was advised to call immediately if he has any concerning symptoms in the interval. The patient voices understanding of current disease status and treatment options and is in agreement with the current care plan. All questions were answered. The patient knows to call the clinic with any problems, questions or concerns. We can certainly see the patient much sooner if necessary       No orders of the defined types were placed in this encounter.     L , PA-C 05/12/20  

## 2020-05-12 NOTE — Patient Outreach (Signed)
[facility]) Care Management  05/12/2020  MURIEL WILBER 07/12/1941 [id number]     Transition of Care Referral  Referral Date: 05/12/20 Referral Source: [facility] Discharge Report Date of Discharge: 05/10/20 Facility: [facility] Insurance: [facility]   Referral received. Transition of care calls being completed via EMMI-automated calls. RN CM will outreach patient for any red flags received.     Plan: RN CM will close case at this time.   Enzo Montgomery, RN,BSN,CCM [place] Management Telephonic Care Management Coordinator Direct Phone: [phone] Toll Free: [phone] Fax: [phone]

## 2020-05-15 ENCOUNTER — Ambulatory Visit: Payer: Medicare HMO

## 2020-05-15 ENCOUNTER — Telehealth: Payer: Self-pay | Admitting: Internal Medicine

## 2020-05-15 ENCOUNTER — Inpatient Hospital Stay: Payer: Medicare HMO

## 2020-05-15 ENCOUNTER — Ambulatory Visit: Payer: Medicare HMO | Admitting: Physician Assistant

## 2020-05-15 ENCOUNTER — Other Ambulatory Visit: Payer: Medicare HMO

## 2020-05-15 ENCOUNTER — Telehealth: Payer: Self-pay | Admitting: Medical Oncology

## 2020-05-15 NOTE — Telephone Encounter (Signed)
Scheduled per 10/11 los, patient has been called and voicemail was left.

## 2020-05-15 NOTE — Telephone Encounter (Signed)
Nurse Reports pt has "stage 11 pressure ulcer" and requests nursing services referral . I told her it is okay for nursing to see pt.

## 2020-05-16 ENCOUNTER — Ambulatory Visit: Payer: Medicare HMO

## 2020-05-16 ENCOUNTER — Other Ambulatory Visit: Payer: Self-pay

## 2020-05-16 ENCOUNTER — Telehealth: Payer: Self-pay | Admitting: Hospice

## 2020-05-16 ENCOUNTER — Telehealth: Payer: Self-pay | Admitting: Radiation Oncology

## 2020-05-16 ENCOUNTER — Telehealth: Payer: Self-pay

## 2020-05-16 ENCOUNTER — Encounter: Payer: Self-pay | Admitting: *Deleted

## 2020-05-16 ENCOUNTER — Ambulatory Visit
Admission: RE | Admit: 2020-05-16 | Discharge: 2020-05-16 | Disposition: A | Discharge status: Home / Self Care | Payer: Medicare HMO | Source: Ambulatory Visit | Attending: Radiation Oncology | Admitting: Radiation Oncology

## 2020-05-16 ENCOUNTER — Encounter: Payer: Self-pay | Admitting: Radiation Oncology

## 2020-05-16 DIAGNOSIS — Z510 Encounter for antineoplastic radiation therapy: Secondary | ICD-10-CM | POA: Diagnosis not present

## 2020-05-16 DIAGNOSIS — C3411 Malignant neoplasm of upper lobe, right bronchus or lung: Secondary | ICD-10-CM | POA: Diagnosis not present

## 2020-05-16 DIAGNOSIS — C3431 Malignant neoplasm of lower lobe, right bronchus or lung: Secondary | ICD-10-CM | POA: Diagnosis not present

## 2020-05-16 NOTE — Telephone Encounter (Signed)
Called patient's daughter, [name], to schedule the Palliative Consult, no answer - left message with reason for call along with my name and call back number.

## 2020-05-16 NOTE — Telephone Encounter (Signed)
I called [facility] and confirmed that referral for Palliative care was received. The rep advised it has not been scheduled yet but they will follow-up to get it scheduled.

## 2020-05-16 NOTE — Telephone Encounter (Signed)
The patient arrived to our department in a wheelchair. When therapy staff went to bring him around the department for treatment, he was hunched over in the wheelchair, with his bottom almost completely off the seat, and unattended by any family or support person. I called and notified the patient's daughter [name], that it would be best if he continued to come in to the office by wheelchair that someone try to accompany him with hopes to avoid him sliding out of the wheelchair. He has pressure ulcers that are likely the source of why he can't comfortably sit in a wheelchair. He was able to have treatment and be lifted up onto the table by our staff. He was taken back upstairs to the lobby on a stretcher by our staff. Social work became aware of his transportation issues and will be reaching out to coordinate safer options of transport. Nursing has has concerns about him as well in terms of why he was discharged from [facility] since he does not appear to be thriving at home. I'll copy medical oncology as well on these notes.     Carola Rhine, PAC

## 2020-05-16 NOTE — Telephone Encounter (Signed)
The patient's daughter [name] called back and let us know that they have a daytime sitter at home with her dad and that he has been telling his daughters he would like to stop treatments. She lives out of town but will be back on Thursday and can come in with him on Friday. She would like to meet with Dr. Julien Nordmann and Dr. Lisbeth Renshaw with her dad to discuss options of stopping therapy and what that might look like, or possibly discontinuing just radiation. I'll message the team to see if we can coordinate with medical oncology and he will continue xrt until Friday.

## 2020-05-16 NOTE — Progress Notes (Signed)
[place] Clinical Social Work  Holiday representative contacted patients daughter; [name] to discuss transportation and care at home.  [name] confirmed [facility] came to the home on Monday. [name] stated they have not been informed what services [facility] planned to provide. CSW encouraged [name] to call the [facility] contact; which she plans to do this afternoon. She also shared that Mr. Doukas has a Actuary that stays with him while she is at work. I explained the concern for transportation and safety. [name] plans to ask the sitter to attend Mr. Record's appointment tomorrow, and will call CSW with updates.  Above information shared with treatment team.  Johnnye Lana, MSW, LCSW, OSW-C Clinical Social Worker [facility] [phone]

## 2020-05-16 NOTE — Telephone Encounter (Signed)
Pts daughter, Scott Conley, requests a hospital bed be ordered for the pt.  Pts daughter also clarified she wants Pallative care for the pt and she wishes for him to continue tx at this time.

## 2020-05-17 ENCOUNTER — Ambulatory Visit: Payer: Medicare HMO

## 2020-05-17 ENCOUNTER — Telehealth: Payer: Self-pay | Admitting: Hospice

## 2020-05-17 NOTE — Telephone Encounter (Signed)
Spoke with patient's daughter, [name], regarding Palliative services and she was in agreement with scheduling visit with NP.  I have scheduled an In-person Consult for 05/19/20 @ 8:30 AM.

## 2020-05-18 ENCOUNTER — Ambulatory Visit: Payer: Medicare HMO

## 2020-05-18 ENCOUNTER — Telehealth: Payer: Self-pay | Admitting: Medical Oncology

## 2020-05-18 DIAGNOSIS — L89156 Pressure-induced deep tissue damage of sacral region: Secondary | ICD-10-CM | POA: Diagnosis not present

## 2020-05-18 DIAGNOSIS — D701 Agranulocytosis secondary to cancer chemotherapy: Secondary | ICD-10-CM | POA: Diagnosis not present

## 2020-05-18 DIAGNOSIS — I69393 Ataxia following cerebral infarction: Secondary | ICD-10-CM | POA: Diagnosis not present

## 2020-05-18 DIAGNOSIS — I10 Essential (primary) hypertension: Secondary | ICD-10-CM | POA: Diagnosis not present

## 2020-05-18 DIAGNOSIS — D630 Anemia in neoplastic disease: Secondary | ICD-10-CM | POA: Diagnosis not present

## 2020-05-18 DIAGNOSIS — C3431 Malignant neoplasm of lower lobe, right bronchus or lung: Secondary | ICD-10-CM | POA: Diagnosis not present

## 2020-05-18 DIAGNOSIS — E119 Type 2 diabetes mellitus without complications: Secondary | ICD-10-CM | POA: Diagnosis not present

## 2020-05-18 DIAGNOSIS — R1312 Dysphagia, oropharyngeal phase: Secondary | ICD-10-CM | POA: Diagnosis not present

## 2020-05-18 DIAGNOSIS — J189 Pneumonia, unspecified organism: Secondary | ICD-10-CM | POA: Diagnosis not present

## 2020-05-18 NOTE — Telephone Encounter (Signed)
Pt is not taking xrt or infusion therapy. Tom notified and I told him to cancel PT.

## 2020-05-19 ENCOUNTER — Ambulatory Visit: Payer: Medicare HMO

## 2020-05-19 ENCOUNTER — Other Ambulatory Visit: Payer: Self-pay | Admitting: Physician Assistant

## 2020-05-19 ENCOUNTER — Other Ambulatory Visit: Payer: Medicare HMO | Admitting: Hospice

## 2020-05-19 ENCOUNTER — Other Ambulatory Visit: Payer: Self-pay

## 2020-05-19 DIAGNOSIS — C3431 Malignant neoplasm of lower lobe, right bronchus or lung: Secondary | ICD-10-CM

## 2020-05-19 DIAGNOSIS — Z515 Encounter for palliative care: Secondary | ICD-10-CM

## 2020-05-19 MED FILL — Dexamethasone Sodium Phosphate Inj 100 MG/10ML: INTRAMUSCULAR | Qty: 2 | Status: AC

## 2020-05-19 NOTE — Progress Notes (Signed)
[place] Consult Note Telephone: [phone]  Fax: [phone]  PATIENT NAME: Scott Conley [address] [place] [address] [phone] (home)  DOB: 1941/03/14 MRN: [id number]  PRIMARY CARE PROVIDER:    [address], [place],  [place] [address] [phone]  REFERRING PROVIDER:   Dr. Curt Bears   RESPONSIBLE PARTY:  [name] - daughter [phone] and [facility]   Extended Emergency Contact Information Primary Emergency Contact: [place] Mobile Phone: [phone] Relation: Daughter Secondary Emergency Contact: [name] Address: [address] [address] [place] Mobile Phone: [phone] Relation: Daughter Patient's phone number is [phone] I met face to face with patient and family in home/facility.  RECOMMENDATIONS/PLAN:    Visit at the request of Dr. Curt Bears    for palliative consult. Visit consisted of building trust and discussions on Palliative Medicine as specialized medical care for people living with serious illness, aimed at facilitating better quality of life through symptoms relief, assisting with advance care plan and establishing goals of care.   Discussion on the difference between Palliative and Hospice care. Palliative care and hospice have similar goals of managing symptoms, promoting comfort, improving quality of life, and maintaining a person's dignity. However, palliative care may be offered during any phase of a serious illness, while hospice care is usually offered when a person is expected to live for 6 months or less.  Patient elected hospice service today.  Advance Care Planning: Our advance care planning conversation included a discussion about:    The value and importance of advance care planning  Experiences with loved ones who have been seriously  ill or have died  Exploration of personal, cultural or spiritual beliefs that might influence medical decisions  Exploration of goals of care in the event of a sudden injury or illness  Identification and preparation of a healthcare agent  Review and updating or creation of an  advance directive document  CODE STATUS: Discussion on implications and ramifications of CODE STATUS.  Patient elected DO NOT RESUSCITATE.  NP signed DNR form for patient; same document uploaded to epic today.  GOALS OF CARE: Comfort measures. Patient has decided not to continue chemo/radiation. Goals of care include to maximize quality of life and symptom management.   Functional decline/Symptom Management: Ongoing functional decline related to Lung CA: worsening weakness, bedbound, poor appetite not able to eat solid food; PPS 20%; patient has decided no more chemo/radiation. He desires comfort care only. He denied pain during visit, resting in bed, FLACC 0.  Daughter affirmed patient has not eaten food in 3 days. [facility] chief Nutritional therapist endorsed patient's eligibility for hospice. NP called [facility] office and was informed patient is no longer a patient of theirs.  NP called Dr. Julien Nordmann and left a voicemail for patient's eligibility and his serving as  Attending.  Expecting callback. I spent  One hour and 46 minutes providing this initial consultation; time includes time spent with patient/family, chart review, provider coordination,  and documentation. More than 50% of the time in this consultation was spent on coordinating communication  CHIEF COMPLAIN/HISTORY OF PRESENT ILLNESS:  Scott Conley is a 79 y.o. male with multiple medical problems including lung cancer, recent pneumonia, hx of CVA HTN. Palliative Care was asked to follow this patient by consultation request of Dr.  Curt Bears  to help address advance care planning and goals of care.  CODE STATUS: DNR  PPS: 20%  HOSPICE  ELIGIBILITY/DIAGNOSIS: TBD  PAST MEDICAL HISTORY:  Past Medical History:  Diagnosis Date  . Aortic atherosclerosis ([place])   . Arthritis   . Blood clot in vein   . CVA (cerebral infarction)   . Diabetes ([place])   . DVT (deep venous thrombosis) ([place])   . Emphysema lung ([place])   . Hypertension   . Lung cancer ([place])   . Osteoarthritis   . Stroke [facility])    Left hand stiffness     SOCIAL HX:  Social History   Tobacco Use  . Smoking status: Former Smoker    Packs/day: 0.50    Years: 58.00    Pack years: 29.00    Types: Cigarettes    Start date: 11/30/2013  . Smokeless tobacco: Never Used  Substance Use Topics  . Alcohol use: No    Comment: none since 1985; hx alcohol abuse   FAMILY HX:  Family History  Problem Relation Age of Onset  . Breast cancer Mother   . Breast cancer Maternal Grandmother   . Cirrhosis Brother     ALLERGIES: No Known Allergies   PERTINENT MEDICATIONS:  Outpatient Encounter Medications as of 05/19/2020  Medication Sig  . arformoterol (BROVANA) 15 MCG/2ML NEBU Take 2 mLs (15 mcg total) by nebulization 2 (two) times daily.  Marland Kitchen aspirin EC 81 MG tablet Take 1 tablet (81 mg total) by mouth daily. Swallow whole.  Marland Kitchen atorvastatin (LIPITOR) 80 MG tablet Take 1 tablet (80 mg total) by mouth daily. Must keep appt w/new provider for future refills (Patient taking differently: Take 80 mg by mouth at bedtime. )  . budesonide (PULMICORT) 0.25 MG/2ML nebulizer solution Take 2 mLs (0.25 mg total) by nebulization 2 (two) times daily.  . carvedilol (COREG) 6.25 MG tablet Take 1 tablet (6.25 mg total) by mouth 2 (two) times daily with a meal.  . docusate sodium (COLACE) 100 MG capsule Take 200 mg by mouth 2 (two) times daily.  Marland Kitchen doxycycline (VIBRAMYCIN) 100 MG capsule Take 1 capsule (100 mg total) by mouth 2 (two) times daily.  . feeding supplement, ENSURE ENLIVE, (ENSURE ENLIVE) LIQD Take 237 mLs by mouth 2 (two) times daily between meals. (Patient not taking: Reported on  04/29/2020)  . ferrous gluconate (FERGON) 324 MG tablet Take 324 mg by mouth daily.  . folic acid (FOLVITE) 1 MG tablet Take 1 tablet (1 mg total) by mouth daily.  Marland Kitchen guaiFENesin (MUCINEX) 600 MG 12 hr tablet Take 600 mg by mouth 2 (two) times daily.  . Multiple Vitamin (MULTIVITAMIN WITH MINERALS) TABS tablet Take 1 tablet by mouth daily.  . pantoprazole (PROTONIX) 40 MG tablet Take 1 tablet (40 mg total) by mouth 2 (two) times daily.  . potassium chloride SA (KLOR-CON) 20 MEQ tablet Take 1 tablet (20 mEq total) by mouth 2 (two) times daily.  . prochlorperazine (COMPAZINE) 10 MG tablet Take 1 tablet (10 mg total) by mouth every 6 (six) hours as needed for nausea or vomiting. (Patient taking differently: Take 10 mg by mouth every 6 (six) hours as needed for nausea or vomiting. For nausea /vomiting)  . sucralfate (CARAFATE) 1 GM/10ML suspension Take 10 mLs (1 g total) by mouth 4 (four) times daily -  with meals and at bedtime. (Patient taking differently: Take 1 g by mouth 4 (four) times daily. )  . traMADol (ULTRAM) 50 MG tablet Take 50  mg by mouth every 12 (twelve) hours as needed for moderate pain.   No facility-administered encounter medications on file as of 05/19/2020.    PHYSICAL EXAM / ROS: Height: 6 feet 4 inches  Weight:187 Ibs self reported General:In no acute distress, frail appearing, cooperative Cardiovascular: regular rate and rhythm, no chest pain reported Pulmonary: no cough, no shortness of breath, clear ant/post fields, normal respiratory effort on room air Abdomen: soft, non tender, positive bowel sounds in all quadrants GU: denies dysuria, no suprapubic tenderness Extremities: no edema, no joint deformities Skin: no rashes to exposed skin Neurological: Weakness but otherwise non focal  Teodoro Spray, NP

## 2020-05-22 ENCOUNTER — Inpatient Hospital Stay: Payer: Medicare HMO

## 2020-05-22 ENCOUNTER — Ambulatory Visit: Payer: Medicare HMO

## 2020-05-22 ENCOUNTER — Ambulatory Visit: Payer: Medicare HMO | Admitting: Radiation Oncology

## 2020-05-22 ENCOUNTER — Inpatient Hospital Stay: Payer: Medicare HMO | Admitting: Internal Medicine

## 2020-05-22 ENCOUNTER — Telehealth: Payer: Self-pay | Admitting: Internal Medicine

## 2020-05-22 NOTE — Telephone Encounter (Signed)
Scheduled appointment per 10/25 sch msg. Spoke to patient's daughter, [name], who is aware of appointment date and time.

## 2020-05-23 ENCOUNTER — Ambulatory Visit: Payer: Medicare HMO

## 2020-05-23 ENCOUNTER — Ambulatory Visit
Admission: RE | Admit: 2020-05-23 | Discharge: 2020-05-23 | Disposition: A | Discharge status: Home / Self Care | Payer: Medicare HMO | Source: Ambulatory Visit | Attending: Radiation Oncology | Admitting: Radiation Oncology

## 2020-05-23 NOTE — Progress Notes (Signed)
I spoke with the patient's daughter today regarding his overall status.  The patient completed 61 [address] Cables and we discussed that this is a reasonable dose of radiation treatment.  Certainly, if the tumor is going to be responsive to radiation treatment, then this dose would be expected to be helpful.  He is doing a little bit better right now and part of this is getting a hospital bed which seems to be helping.  She is also scheduled to discuss his situation with Dr. Inda Merlin next Monday.  I did discuss that we have targeted the area in the right lung and had an area in the left upper lung which we were going to follow and consider potentially treating at some point, depending on how this looks on future scans and also how he is doing.  Given his circumstances, I do not believe that this is something that needs to be considered right now but rather I can follow along and see how he does and see what other decisions he makes with other providers like Dr. Inda Merlin.  If he does continue with any treatment, then I would certainly be happy to look at future imaging and see if there were a role for radiation treatment at some point to another area such as the left lung.  Depending on how the patient is doing and what he is capable of proceeding with, he may not decide to proceed with any further treatment which is certainly his decision and may be very reasonable depending on the circumstances.  I have not scheduled any further follow-up therefore at this time but I will follow up on his upcoming further visits and would be happy to see the patient or discuss his case on the phone if there are any further questions.

## 2020-05-24 ENCOUNTER — Ambulatory Visit: Payer: Medicare HMO

## 2020-05-25 ENCOUNTER — Ambulatory Visit: Payer: Medicare HMO

## 2020-05-25 ENCOUNTER — Ambulatory Visit: Payer: Medicare HMO | Admitting: Nurse Practitioner

## 2020-05-26 ENCOUNTER — Ambulatory Visit: Payer: Medicare HMO

## 2020-05-26 MED FILL — Dexamethasone Sodium Phosphate Inj 100 MG/10ML: INTRAMUSCULAR | Qty: 2 | Status: AC

## 2020-05-27 ENCOUNTER — Emergency Department (HOSPITAL_COMMUNITY)

## 2020-05-27 ENCOUNTER — Inpatient Hospital Stay (HOSPITAL_COMMUNITY)
Admission: EM | Admit: 2020-05-27 | Discharge: 2020-06-28 | DRG: 180 | Disposition: E | Attending: Family Medicine | Admitting: Family Medicine

## 2020-05-27 DIAGNOSIS — N179 Acute kidney failure, unspecified: Secondary | ICD-10-CM | POA: Diagnosis not present

## 2020-05-27 DIAGNOSIS — Z66 Do not resuscitate: Secondary | ICD-10-CM | POA: Diagnosis present

## 2020-05-27 DIAGNOSIS — R404 Transient alteration of awareness: Secondary | ICD-10-CM | POA: Diagnosis not present

## 2020-05-27 DIAGNOSIS — E119 Type 2 diabetes mellitus without complications: Secondary | ICD-10-CM | POA: Diagnosis present

## 2020-05-27 DIAGNOSIS — Z87891 Personal history of nicotine dependence: Secondary | ICD-10-CM | POA: Diagnosis not present

## 2020-05-27 DIAGNOSIS — J90 Pleural effusion, not elsewhere classified: Secondary | ICD-10-CM | POA: Diagnosis not present

## 2020-05-27 DIAGNOSIS — Z7951 Long term (current) use of inhaled steroids: Secondary | ICD-10-CM

## 2020-05-27 DIAGNOSIS — C3431 Malignant neoplasm of lower lobe, right bronchus or lung: Secondary | ICD-10-CM | POA: Diagnosis not present

## 2020-05-27 DIAGNOSIS — Z79891 Long term (current) use of opiate analgesic: Secondary | ICD-10-CM | POA: Diagnosis not present

## 2020-05-27 DIAGNOSIS — Z79899 Other long term (current) drug therapy: Secondary | ICD-10-CM

## 2020-05-27 DIAGNOSIS — D61818 Other pancytopenia: Secondary | ICD-10-CM | POA: Diagnosis present

## 2020-05-27 DIAGNOSIS — J439 Emphysema, unspecified: Secondary | ICD-10-CM | POA: Diagnosis present

## 2020-05-27 DIAGNOSIS — J9819 Other pulmonary collapse: Secondary | ICD-10-CM | POA: Diagnosis present

## 2020-05-27 DIAGNOSIS — J9620 Acute and chronic respiratory failure, unspecified whether with hypoxia or hypercapnia: Secondary | ICD-10-CM | POA: Diagnosis present

## 2020-05-27 DIAGNOSIS — I10 Essential (primary) hypertension: Secondary | ICD-10-CM | POA: Diagnosis present

## 2020-05-27 DIAGNOSIS — M1990 Unspecified osteoarthritis, unspecified site: Secondary | ICD-10-CM | POA: Diagnosis present

## 2020-05-27 DIAGNOSIS — R0602 Shortness of breath: Secondary | ICD-10-CM | POA: Diagnosis not present

## 2020-05-27 DIAGNOSIS — Z792 Long term (current) use of antibiotics: Secondary | ICD-10-CM | POA: Diagnosis not present

## 2020-05-27 DIAGNOSIS — R4020 Unspecified coma: Secondary | ICD-10-CM | POA: Diagnosis not present

## 2020-05-27 DIAGNOSIS — E785 Hyperlipidemia, unspecified: Secondary | ICD-10-CM | POA: Diagnosis present

## 2020-05-27 DIAGNOSIS — E43 Unspecified severe protein-calorie malnutrition: Secondary | ICD-10-CM | POA: Diagnosis not present

## 2020-05-27 DIAGNOSIS — Z515 Encounter for palliative care: Secondary | ICD-10-CM | POA: Diagnosis not present

## 2020-05-27 DIAGNOSIS — R0902 Hypoxemia: Secondary | ICD-10-CM | POA: Diagnosis not present

## 2020-05-27 DIAGNOSIS — Z803 Family history of malignant neoplasm of breast: Secondary | ICD-10-CM

## 2020-05-27 DIAGNOSIS — R627 Adult failure to thrive: Secondary | ICD-10-CM | POA: Diagnosis not present

## 2020-05-27 DIAGNOSIS — J9621 Acute and chronic respiratory failure with hypoxia: Secondary | ICD-10-CM | POA: Diagnosis not present

## 2020-05-27 DIAGNOSIS — Z7982 Long term (current) use of aspirin: Secondary | ICD-10-CM

## 2020-05-27 DIAGNOSIS — Z6824 Body mass index (BMI) 24.0-24.9, adult: Secondary | ICD-10-CM

## 2020-05-27 DIAGNOSIS — Z86718 Personal history of other venous thrombosis and embolism: Secondary | ICD-10-CM | POA: Diagnosis not present

## 2020-05-27 DIAGNOSIS — Z8673 Personal history of transient ischemic attack (TIA), and cerebral infarction without residual deficits: Secondary | ICD-10-CM

## 2020-05-27 DIAGNOSIS — R0689 Other abnormalities of breathing: Secondary | ICD-10-CM | POA: Diagnosis not present

## 2020-05-27 DIAGNOSIS — E860 Dehydration: Secondary | ICD-10-CM | POA: Diagnosis present

## 2020-05-27 DIAGNOSIS — Z20822 Contact with and (suspected) exposure to covid-19: Secondary | ICD-10-CM | POA: Diagnosis not present

## 2020-05-27 LAB — CBC WITH DIFFERENTIAL/PLATELET
Abs Immature Granulocytes: 0.02 10*3/uL (ref 0.00–0.07)
Basophils Absolute: 0 10*3/uL (ref 0.0–0.1)
Basophils Relative: 1 %
Eosinophils Absolute: 0 10*3/uL (ref 0.0–0.5)
Eosinophils Relative: 0 %
HCT: 35.9 % — ABNORMAL LOW (ref 39.0–52.0)
Hemoglobin: 9.6 g/dL — ABNORMAL LOW (ref 13.0–17.0)
Immature Granulocytes: 0 %
Lymphocytes Relative: 20 %
Lymphs Abs: 1.1 10*3/uL (ref 0.7–4.0)
MCH: 24.7 pg — ABNORMAL LOW (ref 26.0–34.0)
MCHC: 26.7 g/dL — ABNORMAL LOW (ref 30.0–36.0)
MCV: 92.5 fL (ref 80.0–100.0)
Monocytes Absolute: 0.3 10*3/uL (ref 0.1–1.0)
Monocytes Relative: 4 %
Neutro Abs: 4.2 10*3/uL (ref 1.7–7.7)
Neutrophils Relative %: 75 %
Platelets: 150 10*3/uL (ref 150–400)
RBC: 3.88 MIL/uL — ABNORMAL LOW (ref 4.22–5.81)
RDW: 23.9 % — ABNORMAL HIGH (ref 11.5–15.5)
WBC Morphology: INCREASED
WBC: 5.7 10*3/uL (ref 4.0–10.5)
nRBC: 2.7 % — ABNORMAL HIGH (ref 0.0–0.2)

## 2020-05-27 LAB — COMPREHENSIVE METABOLIC PANEL
ALT: 29 U/L (ref 0–44)
AST: 40 U/L (ref 15–41)
Albumin: 1.8 g/dL — ABNORMAL LOW (ref 3.5–5.0)
Alkaline Phosphatase: 84 U/L (ref 38–126)
Anion gap: 25 — ABNORMAL HIGH (ref 5–15)
BUN: 25 mg/dL — ABNORMAL HIGH (ref 8–23)
CO2: 10 mmol/L — ABNORMAL LOW (ref 22–32)
Calcium: 8.3 mg/dL — ABNORMAL LOW (ref 8.9–10.3)
Chloride: 111 mmol/L (ref 98–111)
Creatinine, Ser: 2.19 mg/dL — ABNORMAL HIGH (ref 0.61–1.24)
GFR, Estimated: 30 mL/min — ABNORMAL LOW (ref >60)
Glucose, Bld: 239 mg/dL — ABNORMAL HIGH (ref 70–99)
Potassium: 4.9 mmol/L (ref 3.5–5.1)
Sodium: 146 mmol/L — ABNORMAL HIGH (ref 135–145)
Total Bilirubin: 1.4 mg/dL — ABNORMAL HIGH (ref 0.3–1.2)
Total Protein: 5.9 g/dL — ABNORMAL LOW (ref 6.5–8.1)

## 2020-05-27 LAB — LACTIC ACID, PLASMA: Lactic Acid, Venous: >11 mmol/L (ref 0.5–1.9)

## 2020-05-27 LAB — CBC
HCT: 33 % — ABNORMAL LOW (ref 39.0–52.0)
Hemoglobin: 8.7 g/dL — ABNORMAL LOW (ref 13.0–17.0)
MCH: 24.4 pg — ABNORMAL LOW (ref 26.0–34.0)
MCHC: 26.4 g/dL — ABNORMAL LOW (ref 30.0–36.0)
MCV: 92.7 fL (ref 80.0–100.0)
Platelets: 112 10*3/uL — ABNORMAL LOW (ref 150–400)
RBC: 3.56 MIL/uL — ABNORMAL LOW (ref 4.22–5.81)
RDW: 23.6 % — ABNORMAL HIGH (ref 11.5–15.5)
WBC: 2.7 10*3/uL — ABNORMAL LOW (ref 4.0–10.5)
nRBC: 5.2 % — ABNORMAL HIGH (ref 0.0–0.2)

## 2020-05-27 LAB — RESPIRATORY PANEL BY RT PCR (FLU A&B, COVID)
Influenza A by PCR: NEGATIVE
Influenza B by PCR: NEGATIVE
SARS Coronavirus 2 by RT PCR: NEGATIVE

## 2020-05-27 MED ORDER — GUAIFENESIN ER 600 MG PO TB12
600.0000 mg | ORAL_TABLET | Freq: Two times a day (BID) | ORAL | Status: DC
  Filled 2020-05-27: qty 1

## 2020-05-27 MED ORDER — VANCOMYCIN HCL 500 MG/100ML IV SOLN
500.0000 mg | Freq: Once | INTRAVENOUS | Status: AC
  Administered 2020-05-27: 500 mg via INTRAVENOUS
  Filled 2020-05-27: qty 100

## 2020-05-27 MED ORDER — VANCOMYCIN HCL IN DEXTROSE 1-5 GM/200ML-% IV SOLN
1000.0000 mg | Freq: Once | INTRAVENOUS | Status: AC
  Administered 2020-05-27: 1000 mg via INTRAVENOUS
  Filled 2020-05-27: qty 200

## 2020-05-27 MED ORDER — SODIUM CHLORIDE 0.9 % IV SOLN
2.0000 g | Freq: Once | INTRAVENOUS | Status: AC
  Administered 2020-05-27: 2 g via INTRAVENOUS
  Filled 2020-05-27: qty 2

## 2020-05-27 MED ORDER — DEXTROSE 5 % IV SOLN: INTRAVENOUS | Status: DC

## 2020-05-27 MED ORDER — SODIUM CHLORIDE 0.9 % IV SOLN: 2.0000 g | INTRAVENOUS | Status: DC

## 2020-05-27 MED ORDER — SODIUM CHLORIDE 0.9 % IV BOLUS
1000.0000 mL | Freq: Once | INTRAVENOUS | Status: AC
  Administered 2020-05-27: 1000 mL via INTRAVENOUS

## 2020-05-27 MED ORDER — HEPARIN SODIUM (PORCINE) 5000 UNIT/ML IJ SOLN
5000.0000 [IU] | Freq: Three times a day (TID) | INTRAMUSCULAR | Status: DC
  Administered 2020-05-27 – 2020-05-28 (×2): 5000 [IU] via SUBCUTANEOUS
  Filled 2020-05-27 (×2): qty 1

## 2020-05-27 MED ORDER — VANCOMYCIN HCL IN DEXTROSE 1-5 GM/200ML-% IV SOLN: 1000.0000 mg | INTRAVENOUS | Status: DC

## 2020-05-27 MED ORDER — SODIUM CHLORIDE 0.9 % IV SOLN
2.0000 g | Freq: Two times a day (BID) | INTRAVENOUS | Status: DC
  Administered 2020-05-28: 2 g via INTRAVENOUS
  Filled 2020-05-27 (×3): qty 2

## 2020-05-27 MED ORDER — MORPHINE SULFATE (PF) 2 MG/ML IV SOLN
2.0000 mg | INTRAVENOUS | Status: DC | PRN
  Administered 2020-05-27 – 2020-05-29 (×9): 2 mg via INTRAVENOUS
  Filled 2020-05-27 (×10): qty 1

## 2020-05-27 NOTE — Progress Notes (Signed)
Brief Palliative Care Progress Note:  PMT consult received and chart reviewed.   Noted patient is currently under [facility]) hospice services. Melissa O'Bryant/ACC liaison has already been to see patient and family and clarified GOC. I reached out to [facility] and she confirms patient will be under GIP status for this admission. She states there are no PMT needs at this time except notifying the Chaplain of family's request for a visit.   Chaplain was paged and notified of family's request for visit.   There are no further PMT needs. ACC will reach out to PMT directly for any additional needs if they arise.  Thank you for allowing PMT to assist in the care of this patient.  Monda Chastain M. [facility] Palliative Medicine Team Team Phone: [phone] NO CHARGE

## 2020-05-27 NOTE — ED Notes (Signed)
RN reported critical lactic acid >11 to Dr. Francia Greaves

## 2020-05-27 NOTE — H&P (Signed)
History and Physical    CLARKSON ROSSELLI ZOX:[id number] DOB: Dec 11, 1940 DOA: 05/09/2020  PCP: [facility], FNP    Patient coming from:  Home  Chief Complaint: Sob.   HPI: Scott Conley is a 79 y.o. male with medical history significant of lung  Cancer currently in hospice care brought to hospital due to worsening sob and PT is tachypnea and therefor hpi is per edmd note and chart review shows Hospice note.Called daughter number from chart and was not able to connect with daughter.Pt prognosis is guarded. He is DNR [facility] note reviewed.  ED Course:  Blood pressure 132/82, pulse (!) 104, resp. rate (!) 25, SpO2 (!) 59 %. SpO2: (!) 59 % O2 Flow Rate (L/min): 15 L/min Today serum sodium of 146, elevated creatinine of 2.19, lactic acid of more than 11. CBC shows hemoglobin of 9.6, platelets of 150, normal white count of 5.7.   Review of Systems: As per HPI otherwise all systems reviewed and negative.  Past Medical History:  Diagnosis Date   Aortic atherosclerosis (HCC)    Arthritis    Blood clot in vein    CVA (cerebral infarction)    Diabetes ([place])    DVT (deep venous thrombosis) (HCC)    Emphysema lung (HCC)    Hypertension    Lung cancer ([place])    Osteoarthritis    Stroke ([place])    Left hand stiffness     Past Surgical History:  Procedure Laterality Date   APPENDECTOMY     BIOPSY  03/05/2020   Procedure: BIOPSY;  Surgeon: Garner Nash, DO;  Location: [facility];  Service: Pulmonary;;   BIOPSY  04/11/2020   Procedure: BIOPSY;  Surgeon: Irving Copas., MD;  Location: [facility];  Service: Gastroenterology;;   Cydney Ok  03/05/2020   Procedure: CRYOTHERAPY;  Surgeon: Garner Nash, DO;  Location: [facility];  Service: Pulmonary;;   CYST REMOVAL LEG     removed from right groin   ESOPHAGOGASTRODUODENOSCOPY (EGD) WITH PROPOFOL N/A 04/11/2020   Procedure: ESOPHAGOGASTRODUODENOSCOPY (EGD) WITH PROPOFOL;  Surgeon:  Irving Copas., MD;  Location: [facility];  Service: Gastroenterology;  Laterality: N/A;   HEMOSTASIS CONTROL  03/05/2020   Procedure: HEMOSTASIS CONTROL;  Surgeon: Garner Nash, DO;  Location: [facility];  Service: Pulmonary;;   LOOP RECORDER IMPLANT  11/29/13   MDT LinQ implanted by Dr Caryl Comes for cryptogenic stroke   LOOP RECORDER IMPLANT N/A 11/29/2013   Procedure: LOOP RECORDER IMPLANT;  Surgeon: Deboraha Sprang, MD;  Location: [facility] CATH LAB;  Service: Cardiovascular;  Laterality: N/A;   TEE WITHOUT CARDIOVERSION N/A 11/29/2013   Procedure: TRANSESOPHAGEAL ECHOCARDIOGRAM (TEE);  Surgeon: Sueanne Margarita, MD;  Location: [facility];  Service: Cardiovascular;  Laterality: N/A;   TONSILLECTOMY     VIDEO BRONCHOSCOPY WITH ENDOBRONCHIAL ULTRASOUND N/A 03/05/2020   Procedure: VIDEO BRONCHOSCOPY WITH ENDOBRONCHIAL ULTRASOUND;  Surgeon: Garner Nash, DO;  Location: [place] [facility];  Service: Pulmonary;  Laterality: N/A;     reports that he has quit smoking. His smoking use included cigarettes. He started smoking about 6 years ago. He has a 29.00 pack-year smoking history. He has never used smokeless tobacco. He reports that he does not drink alcohol and does not use drugs.  No Known Allergies  Family History  Problem Relation Age of Onset   Breast cancer Mother    Breast cancer Maternal Grandmother    Cirrhosis Brother     Prior to Admission medications   Medication Sig  Start Date End Date Taking? Authorizing Provider  arformoterol (BROVANA) 15 MCG/2ML NEBU Take 2 mLs (15 mcg total) by nebulization 2 (two) times daily. 04/15/20   Eugenie Filler, MD  aspirin EC 81 MG tablet Take 1 tablet (81 mg total) by mouth daily. Swallow whole. 03/06/20 03/06/21  Kayleen Memos, DO  atorvastatin (LIPITOR) 80 MG tablet Take 1 tablet (80 mg total) by mouth daily. Must keep appt w/new provider for future refills Patient taking differently: Take 80 mg by mouth at bedtime.  03/05/20   Kayleen Memos, DO  budesonide (PULMICORT) 0.25 MG/2ML nebulizer solution Take 2 mLs (0.25 mg total) by nebulization 2 (two) times daily. 04/15/20 07/14/20  Eugenie Filler, MD  carvedilol (COREG) 6.25 MG tablet Take 1 tablet (6.25 mg total) by mouth 2 (two) times daily with a meal. 03/05/20   Kayleen Memos, DO  docusate sodium (COLACE) 100 MG capsule Take 200 mg by mouth 2 (two) times daily.    [provider]  doxycycline (VIBRAMYCIN) 100 MG capsule Take 1 capsule (100 mg total) by mouth 2 (two) times daily. 05/08/20   Davonna Belling, MD  feeding supplement, ENSURE ENLIVE, (ENSURE ENLIVE) LIQD Take 237 mLs by mouth 2 (two) times daily between meals. Patient not taking: Reported on 04/29/2020 04/16/20   Eugenie Filler, MD  ferrous gluconate (FERGON) 324 MG tablet Take 324 mg by mouth daily.    [provider]  folic acid (FOLVITE) 1 MG tablet Take 1 tablet (1 mg total) by mouth daily. 04/16/20   Eugenie Filler, MD  guaiFENesin (MUCINEX) 600 MG 12 hr tablet Take 600 mg by mouth 2 (two) times daily.    [provider]  Multiple Vitamin (MULTIVITAMIN WITH MINERALS) TABS tablet Take 1 tablet by mouth daily. 04/16/20   Eugenie Filler, MD  pantoprazole (PROTONIX) 40 MG tablet Take 1 tablet (40 mg total) by mouth 2 (two) times daily. 04/15/20   Eugenie Filler, MD  potassium chloride SA (KLOR-CON) 20 MEQ tablet Take 1 tablet (20 mEq total) by mouth 2 (two) times daily. 05/08/20   Davonna Belling, MD  prochlorperazine (COMPAZINE) 10 MG tablet Take 1 tablet (10 mg total) by mouth every 6 (six) hours as needed for nausea or vomiting. Patient taking differently: Take 10 mg by mouth every 6 (six) hours as needed for nausea or vomiting. For nausea /vomiting 03/20/20   Curt Bears, MD  sucralfate (CARAFATE) 1 GM/10ML suspension Take 10 mLs (1 g total) by mouth 4 (four) times daily -  with meals and at bedtime. Patient taking differently: Take 1 g by mouth 4 (four) times daily.   04/15/20 05/15/20  Eugenie Filler, MD  traMADol (ULTRAM) 50 MG tablet Take 50 mg by mouth every 12 (twelve) hours as needed for moderate pain.    [provider]    Physical Exam: There were no vitals filed for this visit.  Constitutional: NAD, calm, comfortable There were no vitals filed for this visit. Eyes: PERRL, EOMI lids and conjunctivae normal ENMT: Mucous membranes are dry .  Neck: normal, supple, no masses, no thyromegaly, no carotid bruit  Respiratory: BL crackles and rales . No accessory muscle use.  Cardiovascular: Regular rate and rhythm. Abdomen: no tenderness, no masses palpated. No hepatosplenomegaly. Bowel sounds positive.  Skin: no rashes, lesions, ulcers. No induration Neurologic: CN 2-12 grossly intact.  Psychiatric: Normal judgment and insight. Alert and oriented x 3. Normal mood.   Labs on Admission: I  have personally reviewed following labs and imaging studies  CBC: Recent Labs  Lab 05/06/2020 1135  WBC 5.7  NEUTROABS 4.2  HGB 9.6*  HCT 35.9*  MCV 92.5  PLT 130   Basic Metabolic Panel: Recent Labs  Lab 05/20/2020 1135  NA 146*  K 4.9  CL 111  CO2 10*  GLUCOSE 239*  BUN 25*  CREATININE 2.19*  CALCIUM 8.3*   GFR: CrCl cannot be calculated (Unknown ideal weight.). Liver Function Tests: Recent Labs  Lab 05/09/2020 1135  AST 40  ALT PENDING  ALKPHOS 84  BILITOT 1.4*  PROT 5.9*  ALBUMIN 1.8*   Urine analysis:    Component Value Date/Time   COLORURINE YELLOW 04/19/2020 0100   APPEARANCEUR CLEAR 04/19/2020 0100   LABSPEC 1.023 04/19/2020 0100   PHURINE 5.0 04/19/2020 0100   GLUCOSEU NEGATIVE 04/19/2020 0100   HGBUR NEGATIVE 04/19/2020 0100   BILIRUBINUR NEGATIVE 04/19/2020 0100   KETONESUR 20 (A) 04/19/2020 0100   PROTEINUR 30 (A) 04/19/2020 0100   UROBILINOGEN 1.0 11/26/2013 1757   NITRITE NEGATIVE 04/19/2020 0100   LEUKOCYTESUR TRACE (A) 04/19/2020 0100   No intake or output data in the 24 hours ending 05/17/2020 1242 Lab  Results  Component Value Date   CREATININE 2.19 (H) 05/26/2020   CREATININE 0.84 05/08/2020   CREATININE 0.73 04/29/2020    COVID-19 Labs  No results for input(s): DDIMER, FERRITIN, LDH, CRP in the last 72 hours.  Lab Results  Component Value Date   SARSCOV2NAA NEGATIVE 05/08/2020   [place] NEGATIVE 04/17/2020   [place] NEGATIVE 04/07/2020   [place] NEGATIVE 03/04/2020    Radiological Exams on Admission: DG Chest Port 1 View  Result Date: 05/25/2020 CLINICAL DATA:  79 year old male with shortness of breath EXAM: PORTABLE CHEST 1 VIEW COMPARISON:  05/08/2020 FINDINGS: Cardiomediastinal silhouette unchanged in size and contour. Unchanged event recorder on the left chest wall. Right infrahilar superimposed linear and rounded opacity along the midline. No pneumothorax or new pleural effusion. No new confluent airspace disease. IMPRESSION: Similar appearance of the chest x-ray to the comparison, with no acute changes and persisting right infrahilar mass and associated lower lobe collapse. Electronically Signed   By: Corrie Mckusick D.O.   On: 04/29/2020 11:51    EKG: Independently reviewed.  Sinus tachycardia 104.  Assessment/Plan Acute hypoxic respiratory failure due to h/o lung  Cancer Suspect pt may have underlying pna, however pt is dying unfortunately have not been able to reach daughter.I suspect pt needs comfort measures . We will start pt on prn morphine for air hunger.  Reached out to andrea and spoke to her about goals and she wants to treat dad as much as possible and make him comfortable.  - we will conti iv abx and and supplemental oxygen and PRN morphine for air hunger.   DVT prophylaxis:  Heparin  Code Status:  DNR   Family Communication:  None at bedside.   Disposition Plan:  Hospice center.   Consults called:  Palliative care and hospice.   Admission status: Observation  Para Skeans MD [facility] Pager [phone] If 7PM-7AM,  please contact night-coverage [email or web address] Password [email or web address] 05/21/2020, 12:42 PM

## 2020-05-27 NOTE — Plan of Care (Signed)

## 2020-05-27 NOTE — Progress Notes (Signed)
[facility] ED [facility] Liaison note  Mr. Specht is a current hospice patient with [facility] with a CTI of Lung Cancer. Daughter Pam initiated call to EMS due to patient having increased SOB, gasping for air, and stated "I am dying". Per MD patient will be admitted to inpatient. This is a related admission per [facility] MD.  Damaris Schooner with both daughters and they are aware of patient admission and would like for a chaplain to see the patient today if possible. Also daughter [name] is coming from out of town and both daughters would like to visit patient at bedside if we could make this possible for EOL. If comfort medication should be given both daughters agree with comfort measures.   VS: 141/80, HR 89, RR 18, O2 96% NRB Labs: Sodium: 146 (H) Chloride: 111 CO2: 10 (L) Glucose: 239 (H) BUN: 25 (H) Creatinine: 2.19 (H) Calcium: 8.3 (L) Anion gap: 25 (H) Albumin: 1.8 (L) Total Protein: 5.9 (L) Total Bilirubin: 1.4 (H) GFR, Estimated: 30 (L) WBC: 5.7 RBC: 3.88 (L) Hemoglobin: 9.6 (L) HCT: 35.9 (L) MCV: 92.5 MCH: 24.7 (L) MCHC: 26.7 (L) RDW: 23.9 (H) Platelets: 150 nRBC: 2.7 (H) Lactic: 11.0 IV/PRN:  Vanc IVPB 1,000/262ml every 24hr, Cefepime 2g q24hr Diagnostics: none at this time Brief summary by ED MD: 79 year old male with prior medical history as detailed below presents for evaluation.  Patient arrives from home with EMS transport.  EMS reports the patient was in respiratory distress upon their initial arrival.  Patient is DNR/DNI.  Patient is in hospice care for treatment of terminal lung cancer.  Patient reportedly was hypoxic and hypotensive upon initial EMS evaluation.  Patient did require brief episode of BVM assisted ventilation.  Upon arrival to the ED the patient is now wearing 1% FiO2 facemask.  He appears to be alert.  He denies pain.  Contact made with the patient's daughter Jeannene Patella.  She confirms patient is DNR/DNI status.  She confirms that he is a hospice patient.  Pam  reports that the patient looked "too uncomfortable at home to stay."  The patient's daughter is concerned that "he will suffer if he is allowed to stay at home."  DC Planning: unsure, would like BP bed if possible Family contact: Both daughters Pam and [name] have been updated and are aware IDG: updated GOC: DNR, could possibly start on comfort meds if no improvement.  Should patient needs ambulance transfer at discharge-please use [facility] as they contract this service for our active hospice patients.  Clementeen Hoof, BSN, [facility] (in [place]) [phone]

## 2020-05-27 NOTE — Progress Notes (Signed)
Pharmacy Antibiotic Note  Scott Conley is a 79 y.o. male admitted on 04/28/2020 with sepsis.  Pharmacy has been consulted for cefepime and vancomycin dosing.  Plan: Vancomycin 1500 mg IV x1, then 1000 mg IV every 24 hours (Target vancomycin trough 15-20) Add MRSA PCR Cefepime 2g IV every 24 hours Monitor renal function, Cx/PCR and clinical progression to narrow Vancomycin trough at steady state     No data recorded.  Recent Labs  Lab 05/21/2020 1135 05/04/2020 1136  WBC 5.7  --   CREATININE 2.19*  --   LATICACIDVEN  --  >11.0*    CrCl cannot be calculated (Unknown ideal weight.).    No Known Allergies  Bertis Ruddy, PharmD Clinical Pharmacist ED Pharmacist Phone # [phone] 05/18/2020 12:30 PM

## 2020-05-27 NOTE — ED Provider Notes (Signed)
[facility] EMERGENCY DEPARTMENT Provider Note   CSN: [id number] Arrival date & time: 05/18/2020  1110     History Chief Complaint  Patient presents with  . Shortness of Breath    Scott Conley is a 79 y.o. male.  79 year old male with prior medical history as detailed below presents for evaluation.  Patient arrives from home with EMS transport.  EMS reports the patient was in respiratory distress upon their initial arrival.  Patient is DNR/DNI.  Patient is in hospice care for treatment of terminal lung cancer.  Patient reportedly was hypoxic and hypotensive upon initial EMS evaluation.  Patient did require brief episode of BVM assisted ventilation.  Upon arrival to the ED the patient is now wearing 1% FiO2 facemask.  He appears to be alert.  He denies pain.  Contact made with the patient's daughter Scott Conley.  She confirms patient is DNR/DNI status.  She confirms that he is a hospice patient.  Scott Conley reports that the patient looked "too uncomfortable at home to stay."  The patient's daughter is concerned that "he will suffer if he is allowed to stay at home."  The history is provided by the patient and medical records.  Shortness of Breath Severity:  Severe Onset quality:  Unable to specify Timing:  Unable to specify Progression:  Unable to specify      Past Medical History:  Diagnosis Date  . Aortic atherosclerosis ([place])   . Arthritis   . Blood clot in vein   . CVA (cerebral infarction)   . Diabetes ([place])   . DVT (deep venous thrombosis) ([place])   . Emphysema lung ([place])   . Hypertension   . Lung cancer ([place])   . Osteoarthritis   . Stroke [facility])    Left hand stiffness     Patient Active Problem List   Diagnosis Date Noted  . Shortness of breath 05/08/2020  . Generalized weakness 04/17/2020  . Dyspnea   . History of lung biopsy   . Abnormal PET scan of colon   . Erosive esophagitis   . Esophageal dysphagia   . Nodule of left lung   . Controlled type 2  diabetes mellitus with hyperglycemia ([place]) 04/08/2020  . Postobstructive pneumonia 04/07/2020  . Goals of care, counseling/discussion 03/20/2020  . Encounter for antineoplastic chemotherapy 03/20/2020  . Malignant neoplasm of bronchus of right lower lobe ([place])   . Hilar mass   . HLD (hyperlipidemia)   . Hypokalemia   . Hyponatremia   . Microcytic anemia   . Medicare annual wellness visit, subsequent 11/21/2016  . Obesity 11/21/2016  . Diabetes ([place]) 11/21/2015  . Hypertension 11/21/2015  . Low back strain 11/21/2015  . Tobacco use 03/01/2014  . Ataxia, late effect of cerebrovascular disease 01/18/2014  . CVA (cerebral infarction) 11/30/2013  . Stroke [facility]) 11/26/2013    Past Surgical History:  Procedure Laterality Date  . APPENDECTOMY    . BIOPSY  03/05/2020   Procedure: BIOPSY;  Surgeon: Garner Nash, DO;  Location: [place] ENDOSCOPY;  Service: Pulmonary;;  . BIOPSY  04/11/2020   Procedure: BIOPSY;  Surgeon: Irving Copas., MD;  Location: [facility];  Service: Gastroenterology;;  . Cydney Ok  03/05/2020   Procedure: CRYOTHERAPY;  Surgeon: Garner Nash, DO;  Location: [place] [facility]COPY;  Service: Pulmonary;;  . CYST REMOVAL LEG     removed from right groin  . ESOPHAGOGASTRODUODENOSCOPY (EGD) WITH PROPOFOL N/A 04/11/2020   Procedure: ESOPHAGOGASTRODUODENOSCOPY (EGD) WITH PROPOFOL;  Surgeon: Irving Copas., MD;  Location: [facility];  Service: Gastroenterology;  Laterality: N/A;  . HEMOSTASIS CONTROL  03/05/2020   Procedure: HEMOSTASIS CONTROL;  Surgeon: Garner Nash, DO;  Location: [place] [facility]OSCOPY;  Service: Pulmonary;;  . LOOP RECORDER IMPLANT  11/29/13   MDT LinQ implanted by Dr Caryl Comes for cryptogenic stroke  . LOOP RECORDER IMPLANT N/A 11/29/2013   Procedure: LOOP RECORDER IMPLANT;  Surgeon: Deboraha Sprang, MD;  Location[facility];  Service: Cardiovascular;  Laterality: N/A;  . TEE WITHOUT CARDIOVERSION N/A 11/29/2013   Procedure: TRANSESOPHAGEAL ECHOCARDIOGRAM  (TEE);  Surgeon: Sueanne Margarita, MD;  Location: [facility] ENDOSCOPY;  Service: Cardiovascular;  Laterality: N/A;  . TONSILLECTOMY    . VIDEO BRONCHOSCOPY WITH ENDOBRONCHIAL ULTRASOUND N/A 03/05/2020   Procedure: VIDEO BRONCHOSCOPY WITH ENDOBRONCHIAL ULTRASOUND;  Surgeon: Garner Nash, DO;  Location: [place];  Service: Pulmonary;  Laterality: N/A;       Family History  Problem Relation Age of Onset  . Breast cancer Mother   . Breast cancer Maternal Grandmother   . Cirrhosis Brother     Social History   Tobacco Use  . Smoking status: Former Smoker    Packs/day: 0.50    Years: 58.00    Pack years: 29.00    Types: Cigarettes    Start date: 11/30/2013  . Smokeless tobacco: Never Used  Vaping Use  . Vaping Use: Never used  Substance Use Topics  . Alcohol use: No    Comment: none since 1985; hx alcohol abuse  . Drug use: No    Home Medications Prior to Admission medications   Medication Sig Start Date End Date Taking? Authorizing Provider  arformoterol (BROVANA) 15 MCG/2ML NEBU Take 2 mLs (15 mcg total) by nebulization 2 (two) times daily. 04/15/20  Yes Eugenie Filler, MD  aspirin EC 81 MG tablet Take 1 tablet (81 mg total) by mouth daily. Swallow whole. 03/06/20 03/06/21 Yes Hall, Carole N, DO  atorvastatin (LIPITOR) 80 MG tablet Take 1 tablet (80 mg total) by mouth daily. Must keep appt w/new provider for future refills Patient taking differently: Take 80 mg by mouth at bedtime.  03/05/20  Yes Hall, Carole N, DO  budesonide (PULMICORT) 0.25 MG/2ML nebulizer solution Take 2 mLs (0.25 mg total) by nebulization 2 (two) times daily. 04/15/20 07/14/20 Yes Eugenie Filler, MD  carvedilol (COREG) 6.25 MG tablet Take 1 tablet (6.25 mg total) by mouth 2 (two) times daily with a meal. 03/05/20  Yes Irene Pap N, DO  docusate sodium (COLACE) 100 MG capsule Take 200 mg by mouth 2 (two) times daily.   Yes [provider]  feeding supplement, ENSURE ENLIVE, (ENSURE ENLIVE) LIQD Take 237  mLs by mouth 2 (two) times daily between meals. 04/16/20  Yes Eugenie Filler, MD  ferrous gluconate (FERGON) 324 MG tablet Take 324 mg by mouth daily.   Yes [provider]  folic acid (FOLVITE) 1 MG tablet Take 1 tablet (1 mg total) by mouth daily. 04/16/20  Yes Eugenie Filler, MD  guaiFENesin (MUCINEX) 600 MG 12 hr tablet Take 600 mg by mouth 2 (two) times daily.   Yes [provider]  Multiple Vitamin (MULTIVITAMIN WITH MINERALS) TABS tablet Take 1 tablet by mouth daily. 04/16/20  Yes Eugenie Filler, MD  pantoprazole (PROTONIX) 40 MG tablet Take 1 tablet (40 mg total) by mouth 2 (two) times daily. 04/15/20  Yes Eugenie Filler, MD  potassium chloride SA (KLOR-CON) 20 MEQ tablet Take 1 tablet (20 mEq total)  by mouth 2 (two) times daily. 05/08/20  Yes Davonna Belling, MD  prochlorperazine (COMPAZINE) 10 MG tablet Take 1 tablet (10 mg total) by mouth every 6 (six) hours as needed for nausea or vomiting. Patient taking differently: Take 10 mg by mouth every 6 (six) hours as needed for nausea or vomiting. For nausea /vomiting 03/20/20  Yes Curt Bears, MD  sucralfate (CARAFATE) 1 GM/10ML suspension Take 10 mLs (1 g total) by mouth 4 (four) times daily -  with meals and at bedtime. Patient taking differently: Take 1 g by mouth 4 (four) times daily.  04/15/20 05/08/2020 Yes Eugenie Filler, MD  traMADol (ULTRAM) 50 MG tablet Take 50 mg by mouth every 12 (twelve) hours as needed for moderate pain.   Yes [provider]  doxycycline (VIBRAMYCIN) 100 MG capsule Take 1 capsule (100 mg total) by mouth 2 (two) times daily. Patient not taking: Reported on 05/22/2020 05/08/20   Davonna Belling, MD    Allergies    Patient has no known allergies.  Review of Systems   Review of Systems  Unable to perform ROS: Acuity of condition  Respiratory: Positive for shortness of breath.     Physical Exam Updated Vital Signs There were no vitals taken for this  visit.  Physical Exam Vitals and nursing note reviewed.  Constitutional:      General: He is in acute distress.     Appearance: He is ill-appearing.  HENT:     Head: Normocephalic and atraumatic.  Eyes:     Conjunctiva/sclera: Conjunctivae normal.     Pupils: Pupils are equal, round, and reactive to light.  Cardiovascular:     Rate and Rhythm: Regular rhythm. Tachycardia present.     Heart sounds: Normal heart sounds.  Pulmonary:     Effort: Tachypnea and respiratory distress present.     Breath sounds: Examination of the right-lower field reveals decreased breath sounds. Examination of the left-lower field reveals decreased breath sounds. Decreased breath sounds present.  Abdominal:     General: There is no distension.     Palpations: Abdomen is soft.     Tenderness: There is no abdominal tenderness.  Musculoskeletal:        General: No deformity. Normal range of motion.     Cervical back: Normal range of motion and neck supple.  Skin:    General: Skin is warm and dry.  Neurological:     Mental Status: He is alert and oriented to person, place, and time.     ED Results / Procedures / Treatments   Labs (all labs ordered are listed, but only abnormal results are displayed) Labs Reviewed  COMPREHENSIVE METABOLIC PANEL - Abnormal; Notable for the following components:      Result Value   Sodium 146 (*)    CO2 10 (*)    Glucose, Bld 239 (*)    BUN 25 (*)    Creatinine, Ser 2.19 (*)    Calcium 8.3 (*)    Total Protein 5.9 (*)    Albumin 1.8 (*)    Total Bilirubin 1.4 (*)    GFR, Estimated 30 (*)    Anion gap 25 (*)    All other components within normal limits  CBC WITH DIFFERENTIAL/PLATELET - Abnormal; Notable for the following components:   RBC 3.88 (*)    Hemoglobin 9.6 (*)    HCT 35.9 (*)    MCH 24.7 (*)    MCHC 26.7 (*)    RDW 23.9 (*)  nRBC 2.7 (*)    All other components within normal limits  LACTIC ACID, PLASMA - Abnormal; Notable for the following  components:   Lactic Acid, Venous >11.0 (*)    All other components within normal limits  CULTURE, BLOOD (ROUTINE X 2)  CULTURE, BLOOD (ROUTINE X 2)  RESPIRATORY PANEL BY RT PCR (FLU A&B, COVID)  MRSA PCR SCREENING  I-STAT CHEM 8, ED  I-STAT VENOUS BLOOD GAS, ED    EKG EKG Interpretation  Date/Time:  Saturday May 27 2020 12:44:09 EDT Ventricular Rate:  104 PR Interval:    QRS Duration: 110 QT Interval:  390 QTC Calculation: 511 R Axis:   48 Text Interpretation: Sinus tachycardia Paired ventricular premature complexes Abnormal R-wave progression, early transition Confirmed by Dene Gentry [phone]) on 05/26/2020 12:49:52 PM   Radiology DG Chest Port 1 View  Result Date: 04/29/2020 CLINICAL DATA:  79 year old male with shortness of breath EXAM: PORTABLE CHEST 1 VIEW COMPARISON:  05/08/2020 FINDINGS: Cardiomediastinal silhouette unchanged in size and contour. Unchanged event recorder on the left chest wall. Right infrahilar superimposed linear and rounded opacity along the midline. No pneumothorax or new pleural effusion. No new confluent airspace disease. IMPRESSION: Similar appearance of the chest x-ray to the comparison, with no acute changes and persisting right infrahilar mass and associated lower lobe collapse. Electronically Signed   By: Corrie Mckusick D.O.   On: 05/18/2020 11:51    Procedures Procedures (including critical care time) CRITICAL CARE Performed by: Valarie Merino   Total critical care time: 30 minutes  Critical care time was exclusive of separately billable procedures and treating other patients.  Critical care was necessary to treat or prevent imminent or life-threatening deterioration.  Critical care was time spent personally by me on the following activities: development of treatment plan with patient and/or surrogate as well as nursing, discussions with consultants, evaluation of patient's response to treatment, examination of patient, obtaining  history from patient or surrogate, ordering and performing treatments and interventions, ordering and review of laboratory studies, ordering and review of radiographic studies, pulse oximetry and re-evaluation of patient's condition.    Medications Ordered in ED Medications  vancomycin (VANCOREADY) IVPB 500 mg/100 mL (500 mg Intravenous New Bag/Given 04/29/2020 1341)  ceFEPIme (MAXIPIME) 2 g in sodium chloride 0.9 % 100 mL IVPB (has no administration in time range)  vancomycin (VANCOCIN) IVPB 1000 mg/200 mL premix (has no administration in time range)  sodium chloride 0.9 % bolus 1,000 mL (1,000 mLs Intravenous New Bag/Given 04/30/2020 1130)  ceFEPIme (MAXIPIME) 2 g in sodium chloride 0.9 % 100 mL IVPB (2 g Intravenous New Bag/Given 05/22/2020 1242)  vancomycin (VANCOCIN) IVPB 1000 mg/200 mL premix (1,000 mg Intravenous New Bag/Given 05/20/2020 1237)  sodium chloride 0.9 % bolus 1,000 mL (1,000 mLs Intravenous New Bag/Given 04/30/2020 1326)    ED Course  I have reviewed the triage vital signs and the nursing notes.  Pertinent labs & imaging results that were available during my care of the patient were reviewed by me and considered in my medical decision making (see chart for details).    MDM Rules/Calculators/A&P                          MDM  Screen complete  Scott Conley was evaluated in Emergency Department on 05/15/2020 for the symptoms described in the history of present illness. He was evaluated in the context of the global COVID-19 pandemic, which necessitated consideration that the patient  might be at risk for infection with the SARS-CoV-2 virus that causes COVID-19. Institutional protocols and algorithms that pertain to the evaluation of patients at risk for COVID-19 are in a state of rapid change based on information released by regulatory bodies including the CDC and federal and state organizations. These policies and algorithms were followed during the patient's care in the  ED.   Patient is presenting with evidence of significant dehydration in the setting of terminal malignancy.  Patient is a hospice patient.  Patient is DNR/DNI.  This was confirmed with conversation with family.  Family is concerned that the patient would not be comfortable at home.  Discussed with Orpah Clinton of Hospice.  She reports that family is unwilling to take the patient home today.  [facility] does not have a current option for hospice placement.  Dr. Posey Pronto of the hospitalist service is aware case and will evaluate for likely admission.   Final Clinical Impression(s) / ED Diagnoses Final diagnoses:  Shortness of breath  Acute renal failure, unspecified acute renal failure type [facility])    Rx / DC Orders ED Discharge Orders    None       Valarie Merino, MD 05/01/2020 1359

## 2020-05-27 NOTE — ED Triage Notes (Addendum)
Pt has been complaining of SOB since this morning. Pt is a hospice pt. On arrial EMS noted resp 45/ min. Pt is unresponsive and on a non rebreather 15 % prior to ED.

## 2020-05-28 DIAGNOSIS — Z7982 Long term (current) use of aspirin: Secondary | ICD-10-CM | POA: Diagnosis not present

## 2020-05-28 DIAGNOSIS — R627 Adult failure to thrive: Secondary | ICD-10-CM

## 2020-05-28 DIAGNOSIS — Z79899 Other long term (current) drug therapy: Secondary | ICD-10-CM | POA: Diagnosis not present

## 2020-05-28 DIAGNOSIS — Z87891 Personal history of nicotine dependence: Secondary | ICD-10-CM | POA: Diagnosis not present

## 2020-05-28 DIAGNOSIS — Z792 Long term (current) use of antibiotics: Secondary | ICD-10-CM | POA: Diagnosis not present

## 2020-05-28 DIAGNOSIS — N179 Acute kidney failure, unspecified: Secondary | ICD-10-CM | POA: Diagnosis present

## 2020-05-28 DIAGNOSIS — J439 Emphysema, unspecified: Secondary | ICD-10-CM | POA: Diagnosis present

## 2020-05-28 DIAGNOSIS — E43 Unspecified severe protein-calorie malnutrition: Secondary | ICD-10-CM

## 2020-05-28 DIAGNOSIS — E860 Dehydration: Secondary | ICD-10-CM | POA: Diagnosis present

## 2020-05-28 DIAGNOSIS — Z515 Encounter for palliative care: Secondary | ICD-10-CM | POA: Diagnosis not present

## 2020-05-28 DIAGNOSIS — C3431 Malignant neoplasm of lower lobe, right bronchus or lung: Principal | ICD-10-CM

## 2020-05-28 DIAGNOSIS — J9621 Acute and chronic respiratory failure with hypoxia: Secondary | ICD-10-CM | POA: Diagnosis present

## 2020-05-28 DIAGNOSIS — M1990 Unspecified osteoarthritis, unspecified site: Secondary | ICD-10-CM | POA: Diagnosis present

## 2020-05-28 DIAGNOSIS — D61818 Other pancytopenia: Secondary | ICD-10-CM | POA: Diagnosis present

## 2020-05-28 DIAGNOSIS — E785 Hyperlipidemia, unspecified: Secondary | ICD-10-CM | POA: Diagnosis present

## 2020-05-28 DIAGNOSIS — E119 Type 2 diabetes mellitus without complications: Secondary | ICD-10-CM | POA: Diagnosis present

## 2020-05-28 DIAGNOSIS — R0602 Shortness of breath: Secondary | ICD-10-CM | POA: Diagnosis present

## 2020-05-28 DIAGNOSIS — J9819 Other pulmonary collapse: Secondary | ICD-10-CM | POA: Diagnosis present

## 2020-05-28 DIAGNOSIS — Z66 Do not resuscitate: Secondary | ICD-10-CM | POA: Diagnosis present

## 2020-05-28 DIAGNOSIS — Z86718 Personal history of other venous thrombosis and embolism: Secondary | ICD-10-CM | POA: Diagnosis not present

## 2020-05-28 DIAGNOSIS — I10 Essential (primary) hypertension: Secondary | ICD-10-CM | POA: Diagnosis present

## 2020-05-28 DIAGNOSIS — Z20822 Contact with and (suspected) exposure to covid-19: Secondary | ICD-10-CM | POA: Diagnosis present

## 2020-05-28 DIAGNOSIS — Z79891 Long term (current) use of opiate analgesic: Secondary | ICD-10-CM | POA: Diagnosis not present

## 2020-05-28 DIAGNOSIS — Z7951 Long term (current) use of inhaled steroids: Secondary | ICD-10-CM | POA: Diagnosis not present

## 2020-05-28 DIAGNOSIS — Z6824 Body mass index (BMI) 24.0-24.9, adult: Secondary | ICD-10-CM | POA: Diagnosis not present

## 2020-05-28 NOTE — Progress Notes (Addendum)
[facility] Liaison note   Mr. Broadfoot is a current hospice patient with [facility] with a CTI of Lung Cancer. Daughter Pam initiated call to EMS due to patient having increased SOB, gasping for air, and stated "I am dying". Per MD patient will be admitted to inpatient. This is a related admission per [facility] MD.   Visited pt at bedside.  Pt did not rouse to voice or gentle touch.  Cheyne Stokes resp noted along with cool extremities.  Mottled noted to knees.  NAD noted.  Spoke with daughter Jeannene Patella by phone who verbalized understanding that her father was "transitioning,"  asked that he be kept under comfort orders until he can be moved to [employer].      VS: 97.30F, 88/52, HR 91, RR 16,  100% NRB 15L Labs: WBC 2.7, Hbg 8.7, Platelets 112 IV/PRN:  D5@75ml /hr (now dc'ed),  Cefepime 2g q24hr (now dc'ed), morphine 2mg  PIV q4h PRN pain or air hunger x3 doses Diagnostics: none new at this time Problem List: Active Problems:   Malignant neoplasm of bronchus of right lower lobe (H[facility])   Acute and chronic respiratory failure (acute-on-chronic) ([place])   #1.  Acute on chronic respiratory failure secondary to lung cancer and collapse of right lower lobe. Patient condition is terminal.  Life expectancy probably less than 2 weeks.  Hospice will see the patient, may be transferred to inpatient hospice if a bed available.   2.  Failure to thrive.  Due to lung cancer. Patient has not been eating for 2 weeks.  Condition terminal.   3.  History of stroke.   4.  Severe protein calorie malnutrition. Patient has significant muscle atrophy, appears very malnourished.   5.  Pancytopenia. No additional work-up.   6.  Acute kidney injury. No treatment due to comfort care.    DC Planning: comfort care at [facility], transition to [facility] when bed availiable Family contact: Both daughters Jeannene Patella and [name] have been updated and are aware IDT: updated GOC: DNR, Comfort Care only at this time    Should patient needs ambulance transfer at discharge-please use [facility] as they contract this service for our active hospice patients.    Domenic Moras, BSN, RN [facility] [phone] (24h on call)

## 2020-05-28 NOTE — Progress Notes (Signed)
PROGRESS NOTE    [name]  JFH:[id number] DOB: April 12, 1941 DOA: 05/18/2020 PCP: [facility], FNP   Chief complaint.  Failure to thrive. Brief Narrative:  Patient is a 79 year old male with lung cancer, prior CVA, type 2 diabetes, COPD who present to the hospital with worsening short of breath.  I met patient daughter [name] today, patient has not been eating for 2 weeks, he is confused.  He was under hospice care. This x-ray showed right sided lung cancer with the right lower lobe collapsed.  I do not see any infection, antibiotic discontinued. Due to poor prognosis, consult from hospice is obtained, patient may be able to transfer to inpatient hospice when bed available.  Start comfort measures.   Assessment & Plan:   Active Problems:   Malignant neoplasm of bronchus of right lower lobe (HCC)   Acute and chronic respiratory failure (acute-on-chronic) ([place])  #1.  Acute on chronic respiratory failure secondary to lung cancer and collapse of right lower lobe. Patient condition is terminal.  Life expectancy probably less than 2 weeks.  Hospice will see the patient, may be transferred to inpatient hospice if a bed available.  2.  Failure to thrive.  Due to lung cancer. Patient has not been eating for 2 weeks.  Condition terminal.  3.  History of stroke.  4.  Severe protein calorie malnutrition. Patient has significant muscle atrophy, appears very malnourished.  5.  Pancytopenia. No additional work-up.  6.  Acute kidney injury. No treatment due to comfort care.    DVT prophylaxis: None Code Status: DNR Family Communication: Had a long discussion with the patient daughter, all questions answered.  .   Status is: Observation  The patient remains OBS appropriate and will d/c before 2 midnights.  We we will discharge to hospice inpatient when bed is available.  Dispo: The patient is from: Home              Anticipated d/c is to: hospice inpatient               Anticipated d/c date is: 1 day              Patient currently is not medically stable to d/c.        I/O last 3 completed shifts: In: 4572.2 [IV Piggyback:4572.2] Out: 0  No intake/output data recorded.     Consultants:   None  Procedures: None  Antimicrobials:None  Subjective: Patient respond to verbal command by opening eyes.  He does not talk.  He is on very high flow oxygen.  No respiratory distress.   Objective: Vitals:   05/05/2020 1530 05/13/2020 1545 05/07/2020 1600 04/28/2020 1721  BP: 104/62 116/74 101/72 (!) 68/53  Pulse: 96 (!) 102 100 (!) 102  Resp: (!) 25 (!) 29 (!) 28 (!) 26  SpO2: 99% (!) 86% 99% 100%  Weight:        Intake/Output Summary (Last 24 hours) at 05/28/2020 1251 Last data filed at 05/23/2020 1717 Gross per 24 hour  Intake 4572.24 ml  Output 0 ml  Net 4572.24 ml   Filed Weights   05/19/2020 1515  Weight: 89.8 kg    Examination:  General exam: Appears calm and comfortable , appears severely malnourished. Respiratory system: Significant decreased breathing sounds bilaterally. Respiratory effort normal. Cardiovascular system: S1 & S2 heard, RRR. No JVD, murmurs, rubs, gallops or clicks. No pedal edema. Gastrointestinal system: Abdomen is nondistended, soft and nontender. No organomegaly or masses felt. Normal bowel sounds  heard. Central nervous system: Alert and confused. No focal neurological deficits. Extremities: Symmetric r. Skin: No rashes, lesions or ulcers      Data Reviewed: I have personally reviewed following labs and imaging studies  CBC: Recent Labs  Lab 05/02/2020 1135 05/23/2020 1603  WBC 5.7 2.7*  NEUTROABS 4.2  --   HGB 9.6* 8.7*  HCT 35.9* 33.0*  MCV 92.5 92.7  PLT 150 014*   Basic Metabolic Panel: Recent Labs  Lab 05/16/2020 1135  NA 146*  K 4.9  CL 111  CO2 10*  GLUCOSE 239*  BUN 25*  CREATININE 2.19*  CALCIUM 8.3*   GFR: Estimated Creatinine Clearance: 33 mL/min (A) (by C-G formula based on SCr of  2.19 mg/dL (H)). Liver Function Tests: Recent Labs  Lab 05/22/2020 1135  AST 40  ALT 29  ALKPHOS 84  BILITOT 1.4*  PROT 5.9*  ALBUMIN 1.8*   No results for input(s): LIPASE, AMYLASE in the last 168 hours. No results for input(s): AMMONIA in the last 168 hours. Coagulation Profile: No results for input(s): INR, PROTIME in the last 168 hours. Cardiac Enzymes: No results for input(s): CKTOTAL, CKMB, CKMBINDEX, TROPONINI in the last 168 hours. BNP (last 3 results) No results for input(s): PROBNP in the last 8760 hours. HbA1C: No results for input(s): HGBA1C in the last 72 hours. CBG: No results for input(s): GLUCAP in the last 168 hours. Lipid Profile: No results for input(s): CHOL, HDL, LDLCALC, TRIG, CHOLHDL, LDLDIRECT in the last 72 hours. Thyroid Function Tests: No results for input(s): TSH, T4TOTAL, FREET4, T3FREE, THYROIDAB in the last 72 hours. Anemia Panel: No results for input(s): VITAMINB12, FOLATE, FERRITIN, TIBC, IRON, RETICCTPCT in the last 72 hours. Sepsis Labs: Recent Labs  Lab 05/06/2020 1136  LATICACIDVEN >11.0*    Recent Results (from the past 240 hour(s))  Culture, blood (routine x 2)     Status: None (Preliminary result)   Collection Time: 04/30/2020 11:24 AM   Specimen: BLOOD LEFT WRIST  Result Value Ref Range Status   Specimen Description BLOOD LEFT WRIST  Final   Special Requests   Final    BOTTLES DRAWN AEROBIC ONLY Blood Culture results may not be optimal due to an inadequate volume of blood received in culture bottles   Culture   Final    NO GROWTH < 24 HOURS Performed at [facility], [place] [address] [place], [place] [address]    Report Status PENDING  Incomplete  Culture, blood (routine x 2)     Status: None (Preliminary result)   Collection Time: 05/20/2020 11:30 AM   Specimen: BLOOD RIGHT WRIST  Result Value Ref Range Status   Specimen Description BLOOD RIGHT WRIST  Final   Special Requests   Final    BOTTLES DRAWN AEROBIC AND ANAEROBIC  Blood Culture results may not be optimal due to an inadequate volume of blood received in culture bottles   Culture   Final    NO GROWTH < 24 HOURS Performed at [facility], [place] [address] [place], [place] [address]    Report Status PENDING  Incomplete  Respiratory Panel by RT PCR (Flu A&B, Covid) - Nasopharyngeal Swab     Status: None   Collection Time: 05/13/2020  2:33 PM   Specimen: Nasopharyngeal Swab  Result Value Ref Range Status   SARS Coronavirus 2 by RT PCR NEGATIVE NEGATIVE Final    Comment: (NOTE) SARS-CoV-2 target nucleic acids are NOT DETECTED.  The SARS-CoV-2 RNA is generally detectable  in upper respiratoy specimens during the acute phase of infection. The lowest concentration of SARS-CoV-2 viral copies this assay can detect is 131 copies/mL. A negative result does not preclude SARS-Cov-2 infection and should not be used as the sole basis for treatment or other patient management decisions. A negative result may occur with  improper specimen collection/handling, submission of specimen other than nasopharyngeal swab, presence of viral mutation(s) within the areas targeted by this assay, and inadequate number of viral copies (<131 copies/mL). A negative result must be combined with clinical observations, patient history, and epidemiological information. The expected result is Negative.  Fact Sheet for Patients:  PinkCheek.be  Fact Sheet for Healthcare Providers:  GravelBags.it  This test is no t yet approved or cleared by the [place] FDA and  has been authorized for detection and/or diagnosis of SARS-CoV-2 by FDA under an Emergency Use Authorization (EUA). This EUA will remain  in effect (meaning this test can be used) for the duration of the COVID-19 declaration under Section 564(b)(1) of the Act, 21 U.S.C. section 360bbb-3(b)(1), unless the authorization is terminated or revoked sooner.      Influenza A by PCR NEGATIVE NEGATIVE Final   Influenza B by PCR NEGATIVE NEGATIVE Final    Comment: (NOTE) The Xpert Xpress SARS-CoV-2/FLU/RSV assay is intended as an aid in  the diagnosis of influenza from Nasopharyngeal swab specimens and  should not be used as a sole basis for treatment. Nasal washings and  aspirates are unacceptable for Xpert Xpress SARS-CoV-2/FLU/RSV  testing.  Fact Sheet for Patients: PinkCheek.be  Fact Sheet for Healthcare Providers: GravelBags.it  This test is not yet approved or cleared by the [place] FDA and  has been authorized for detection and/or diagnosis of SARS-CoV-2 by  FDA under an Emergency Use Authorization (EUA). This EUA will remain  in effect (meaning this test can be used) for the duration of the  Covid-19 declaration under Section 564(b)(1) of the Act, 21  U.S.C. section 360bbb-3(b)(1), unless the authorization is  terminated or revoked. Performed at [facility], [place] [address] [place], [place] [address]          Radiology Studies: DG Chest Port 1 View  Result Date: 04/29/2020 CLINICAL DATA:  79 year old male with shortness of breath EXAM: PORTABLE CHEST 1 VIEW COMPARISON:  05/08/2020 FINDINGS: Cardiomediastinal silhouette unchanged in size and contour. Unchanged event recorder on the left chest wall. Right infrahilar superimposed linear and rounded opacity along the midline. No pneumothorax or new pleural effusion. No new confluent airspace disease. IMPRESSION: Similar appearance of the chest x-ray to the comparison, with no acute changes and persisting right infrahilar mass and associated lower lobe collapse. Electronically Signed   By: Corrie Mckusick D.O.   On: 05/04/2020 11:51        Scheduled Meds: . guaiFENesin  600 mg Oral BID  . heparin  5,000 Units Subcutaneous Q8H   Continuous Infusions:   LOS: 0 days    Time spent: 35 minutes, more than 50% time  involved in direct patient care    Sharen Hones, MD [facility]   To contact the attending provider between 7A-7P or the covering provider during after hours 7P-7A, please log into the web site [email or web address] and access using universal [place] password for that web site. If you do not have the password, please call the hospital operator.  05/28/2020, 12:51 PM

## 2020-05-28 NOTE — Plan of Care (Signed)

## 2020-05-29 ENCOUNTER — Inpatient Hospital Stay: Payer: Medicare HMO | Admitting: Internal Medicine

## 2020-05-29 ENCOUNTER — Inpatient Hospital Stay: Payer: Medicare HMO

## 2020-05-29 ENCOUNTER — Ambulatory Visit: Payer: Medicare HMO

## 2020-05-29 DIAGNOSIS — J9621 Acute and chronic respiratory failure with hypoxia: Secondary | ICD-10-CM | POA: Diagnosis not present

## 2020-05-29 MED ORDER — GLYCOPYRROLATE 0.2 MG/ML IJ SOLN: 0.2000 mg | INTRAMUSCULAR | Status: DC | PRN

## 2020-05-29 MED ORDER — MORPHINE BOLUS VIA INFUSION
2.0000 mg | INTRAVENOUS | Status: DC | PRN
  Filled 2020-05-29: qty 2

## 2020-05-29 MED ORDER — BIOTENE DRY MOUTH MT LIQD: 15.0000 mL | OROMUCOSAL | Status: DC | PRN

## 2020-05-29 MED ORDER — MORPHINE 100MG IN NS 100ML (1MG/ML) PREMIX INFUSION
1.0000 mg/h | INTRAVENOUS | Status: DC
  Administered 2020-05-29: 1 mg/h via INTRAVENOUS
  Filled 2020-05-29: qty 100

## 2020-05-29 MED ORDER — ACETAMINOPHEN 325 MG PO TABS: 650.0000 mg | ORAL_TABLET | Freq: Four times a day (QID) | ORAL | Status: DC | PRN

## 2020-05-29 MED ORDER — HALOPERIDOL 1 MG PO TABS: 0.5000 mg | ORAL_TABLET | ORAL | Status: DC | PRN

## 2020-05-29 MED ORDER — ONDANSETRON HCL 4 MG/2ML IJ SOLN: 4.0000 mg | Freq: Four times a day (QID) | INTRAMUSCULAR | Status: DC | PRN

## 2020-05-29 MED ORDER — MORPHINE 100MG IN NS 100ML (1MG/ML) PREMIX INFUSION
1.0000 mg/h | INTRAVENOUS | Status: DC
Start: 2020-05-29 — End: 2020-05-29

## 2020-05-29 MED ORDER — HALOPERIDOL LACTATE 5 MG/ML IJ SOLN: 0.5000 mg | INTRAMUSCULAR | Status: DC | PRN

## 2020-05-29 MED ORDER — MORPHINE SULFATE (PF) 2 MG/ML IV SOLN
2.0000 mg | INTRAVENOUS | Status: DC | PRN
  Administered 2020-05-29: 2 mg via INTRAVENOUS
  Filled 2020-05-29: qty 1

## 2020-05-29 MED ORDER — GLYCOPYRROLATE 1 MG PO TABS: 1.0000 mg | ORAL_TABLET | ORAL | Status: DC | PRN

## 2020-05-29 MED ORDER — ONDANSETRON 4 MG PO TBDP: 4.0000 mg | ORAL_TABLET | Freq: Four times a day (QID) | ORAL | Status: DC | PRN

## 2020-05-29 MED ORDER — POLYVINYL ALCOHOL 1.4 % OP SOLN
1.0000 [drp] | Freq: Four times a day (QID) | OPHTHALMIC | Status: DC | PRN
  Filled 2020-05-29: qty 15

## 2020-05-29 MED ORDER — ACETAMINOPHEN 650 MG RE SUPP: 650.0000 mg | Freq: Four times a day (QID) | RECTAL | Status: DC | PRN

## 2020-05-29 MED ORDER — HALOPERIDOL LACTATE 2 MG/ML PO CONC
0.5000 mg | ORAL | Status: DC | PRN
  Filled 2020-05-29: qty 0.3

## 2020-05-29 DEATH — deceased

## 2020-05-30 ENCOUNTER — Ambulatory Visit: Payer: Medicare HMO

## 2020-05-30 NOTE — Progress Notes (Signed)
Morphine Gtt turned off and wasted with Francee Nodal RN, remaining amount 94 cc.

## 2020-05-31 ENCOUNTER — Telehealth: Payer: Self-pay | Admitting: Medical Oncology

## 2020-05-31 ENCOUNTER — Ambulatory Visit: Payer: Medicare HMO

## 2020-05-31 NOTE — Telephone Encounter (Signed)
Pt died 06-07-2020 @ 2235.

## 2020-06-01 LAB — CULTURE, BLOOD (ROUTINE X 2)
Culture: NO GROWTH
Culture: NO GROWTH

## 2020-06-02 ENCOUNTER — Ambulatory Visit: Payer: Medicare HMO | Admitting: Internal Medicine

## 2020-06-05 ENCOUNTER — Ambulatory Visit: Payer: Medicare HMO

## 2020-06-05 ENCOUNTER — Inpatient Hospital Stay: Payer: Medicare HMO

## 2020-06-10 DIAGNOSIS — R531 Weakness: Secondary | ICD-10-CM | POA: Diagnosis not present

## 2020-06-10 DIAGNOSIS — R911 Solitary pulmonary nodule: Secondary | ICD-10-CM | POA: Diagnosis not present

## 2020-06-10 DIAGNOSIS — C3431 Malignant neoplasm of lower lobe, right bronchus or lung: Secondary | ICD-10-CM | POA: Diagnosis not present

## 2020-06-28 NOTE — Death Summary Note (Signed)
Expiration Note/ Death Summary  Scott Conley  MR#: [id number]  DOB:May 25, 1941  Date of Admission: 06/10/20 Date of Death: 06/13/20 Time of Death: 19-Oct-2233  Attending Physician:Matisse Roskelley P [name]  Patient's PCP: [facility], FNP  Consults:  None    Brief H and P: For complete details please refer to admission H and P, but in brief Scott Conley was an 79 y.o. M with PMHx significant for DM, emphysema, and stroke as well as recently diagnosed T3N2M0 NSCLC of the right lower lobe who presented to the hospital with dyspnea and failure to thrive.     Hospital Course: On arrival to the hospital, the patient was dyspneic, hypoxic, and struggling for comfort.  Despite aggressive chemotherapy and radiation, his cancer had progressed, and patient's family made clear his wishes were for palliation and a dignified death.  The patient was provided oxygen and morphine for relief of air hunger.  He quickly became less responsive, and passed away with family at the bedside.    The patient passed away from progression of his right lower lobe non-small cell lung cancer.       Signed:  Edwin Dada M.D. [facility] 06/13/2020, 9:47 PM

## 2020-06-28 NOTE — Progress Notes (Signed)
Manufacturing engineer [facility]) Hospital Liaison note.    [place] is able to offer a bed to Mr. Parkison today.  Pam, daughter, reports being told her father is actively dying and reports she is en route to [employer].  Bed declined at this time in favor of a hospital death.  The [facility] hospital liaisons will continue to follow.  Appreciate the care being given to Mr Hawker and his family.   Chrislyn Edison Pace, BSN, RN [place] (listed on [place] under [facility]ospice[facility])    [phone]

## 2020-06-28 NOTE — Progress Notes (Signed)
[facility] PROGRESS NOTE    Scott Conley  OXB:[id number] DOB: 12-12-1940 DOA: 05/22/2020 PCP: [facility], FNP      Brief Narrative:  Patient is a 79 year old male with lung cancer, prior CVA, type 2 diabetes, COPD who present to the hospital with worsening short of breath.  I met patient daughter [name] today, patient has not been eating for 2 weeks, he is confused.  He was under hospice care. This x-ray showed right sided lung cancer with the right lower lobe collapsed.  I do not see any infection, antibiotic discontinued. Due to poor prognosis, consult from hospice is obtained, patient may be able to transfer to inpatient hospice when bed available.  Start comfort measures.       Assessment & Plan:  #1.  Acute on chronic respiratory failure secondary to lung cancer and collapse of right lower lobe. -Start morphine gtt -Likely in hospital death  2.  Failure to thrive.  Due to lung cancer. Patient has not been eating for 2 weeks.  Condition terminal.  3.  History of stroke.  4.  Severe protein calorie malnutrition. Patient has significant muscle atrophy, appears very malnourished.  5.  Pancytopenia. No additional work-up.  6.  Acute kidney injury. No treatment due to comfort care.         Disposition: Status is: Inpatient  Remains inpatient appropriate because:Inpatient level of care appropriate due to severity of illness   Dispo: The patient is from: Home              Anticipated d/c is to: likely in hospital death              Anticipated d/c date is: 1 day              Patient currently is not medically stable to d/c.              MDM: The below labs and imaging reports were reviewed and summarized above.  Medication management as above.   DVT prophylaxis:   Code Status: DNR Family Communication: children at bedside          Subjective: Patient unresponsvie.  Breaths agonal.  Some agitation, no  weakness.  Objective: Vitals:   05/17/2020 1600 05/25/2020 1721 05/28/20 1423 05/28/20 2119  BP: 101/72 (!) 68/53 (!) 88/52 (!) 74/52  Pulse: 100 (!) 102 91 99  Resp: (!) 28 (!) 26  20  Temp:   (!) 97.5 F (36.4 C) 97.7 F (36.5 C)  TempSrc:    Axillary  SpO2: 99% 100%  100%  Weight:        Intake/Output Summary (Last 24 hours) at 2020-06-05 1648 Last data filed at June 05, 2020 1549 Gross per 24 hour  Intake 0 ml  Output --  Net 0 ml   Filed Weights   05/13/2020 1515  Weight: 89.8 kg    Examination: General appearance: thin elderly adult male, somnolent, agonal cachectic HEENT: Anicteric, conjunctiva pink, lids and lashes normal. No nasal deformity, discharge, epistaxis.      Skin: Warm and dry.  . Cardiac: Normal rate and rhythm, nl S1-S2, no murmurs appreciated.  Capillary refill is brisk.    Respiratory: Agonal breathing, rattle noted.   Lung sounds diminished. Abdomen: Abdomen scaphoid, no rebound or guarding No ascites, distension, hepatosplenomegaly.   MSK: No deformities or effusions.diffuse severe cachexia Neuro: somnolent.does not follow commands or make any spontaenous movements or verbalizations. Psych:      Data Reviewed: I  have personally reviewed following labs and imaging studies:  CBC: Recent Labs  Lab 05/25/2020 1135 05/06/2020 1603  WBC 5.7 2.7*  NEUTROABS 4.2  --   HGB 9.6* 8.7*  HCT 35.9* 33.0*  MCV 92.5 92.7  PLT 150 176*   Basic Metabolic Panel: Recent Labs  Lab 05/22/2020 1135  NA 146*  K 4.9  CL 111  CO2 10*  GLUCOSE 239*  BUN 25*  CREATININE 2.19*  CALCIUM 8.3*   GFR: Estimated Creatinine Clearance: 33 mL/min (A) (by C-G formula based on SCr of 2.19 mg/dL (H)). Liver Function Tests: Recent Labs  Lab 05/25/2020 1135  AST 40  ALT 29  ALKPHOS 84  BILITOT 1.4*  PROT 5.9*  ALBUMIN 1.8*   No results for input(s): LIPASE, AMYLASE in the last 168 hours. No results for input(s): AMMONIA in the last 168 hours. Coagulation Profile: No  results for input(s): INR, PROTIME in the last 168 hours. Cardiac Enzymes: No results for input(s): CKTOTAL, CKMB, CKMBINDEX, TROPONINI in the last 168 hours. BNP (last 3 results) No results for input(s): PROBNP in the last 8760 hours. HbA1C: No results for input(s): HGBA1C in the last 72 hours. CBG: No results for input(s): GLUCAP in the last 168 hours. Lipid Profile: No results for input(s): CHOL, HDL, LDLCALC, TRIG, CHOLHDL, LDLDIRECT in the last 72 hours. Thyroid Function Tests: No results for input(s): TSH, T4TOTAL, FREET4, T3FREE, THYROIDAB in the last 72 hours. Anemia Panel: No results for input(s): VITAMINB12, FOLATE, FERRITIN, TIBC, IRON, RETICCTPCT in the last 72 hours. Urine analysis:    Component Value Date/Time   COLORURINE YELLOW 04/19/2020 0100   APPEARANCEUR CLEAR 04/19/2020 0100   LABSPEC 1.023 04/19/2020 0100   PHURINE 5.0 04/19/2020 0100   GLUCOSEU NEGATIVE 04/19/2020 0100   HGBUR NEGATIVE 04/19/2020 0100   BILIRUBINUR NEGATIVE 04/19/2020 0100   KETONESUR 20 (A) 04/19/2020 0100   PROTEINUR 30 (A) 04/19/2020 0100   UROBILINOGEN 1.0 11/26/2013 1757   NITRITE NEGATIVE 04/19/2020 0100   LEUKOCYTESUR TRACE (A) 04/19/2020 0100   Sepsis Labs: '@LABRCNTIP' (procalcitonin:4,lacticacidven:4)  ) Recent Results (from the past 240 hour(s))  Culture, blood (routine x 2)     Status: None (Preliminary result)   Collection Time: 05/09/2020 11:24 AM   Specimen: BLOOD LEFT WRIST  Result Value Ref Range Status   Specimen Description BLOOD LEFT WRIST  Final   Special Requests   Final    BOTTLES DRAWN AEROBIC ONLY Blood Culture results may not be optimal due to an inadequate volume of blood received in culture bottles   Culture   Final    NO GROWTH 2 DAYS Performed at [facility], [place] [address], [place], [place] [address]    Report Status PENDING  Incomplete  Culture, blood (routine x 2)     Status: None (Preliminary result)   Collection Time: 05/28/2020 11:30 AM    Specimen: BLOOD RIGHT WRIST  Result Value Ref Range Status   Specimen Description BLOOD RIGHT WRIST  Final   Special Requests   Final    BOTTLES DRAWN AEROBIC AND ANAEROBIC Blood Culture results may not be optimal due to an inadequate volume of blood received in culture bottles   Culture   Final    NO GROWTH 2 DAYS Performed at [facility], [place] [address] [place], [place] [address]    Report Status PENDING  Incomplete  Respiratory Panel by RT PCR (Flu A&B, Covid) - Nasopharyngeal Swab     Status: None   Collection  Time: 05/04/2020  2:33 PM   Specimen: Nasopharyngeal Swab  Result Value Ref Range Status   SARS Coronavirus 2 by RT PCR NEGATIVE NEGATIVE Final    Comment: (NOTE) SARS-CoV-2 target nucleic acids are NOT DETECTED.  The SARS-CoV-2 RNA is generally detectable in upper respiratoy specimens during the acute phase of infection. The lowest concentration of SARS-CoV-2 viral copies this assay can detect is 131 copies/mL. A negative result does not preclude SARS-Cov-2 infection and should not be used as the sole basis for treatment or other patient management decisions. A negative result may occur with  improper specimen collection/handling, submission of specimen other than nasopharyngeal swab, presence of viral mutation(s) within the areas targeted by this assay, and inadequate number of viral copies (<131 copies/mL). A negative result must be combined with clinical observations, patient history, and epidemiological information. The expected result is Negative.  Fact Sheet for Patients:  PinkCheek.be  Fact Sheet for Healthcare Providers:  GravelBags.it  This test is no t yet approved or cleared by the [place] FDA and  has been authorized for detection and/or diagnosis of SARS-CoV-2 by FDA under an Emergency Use Authorization (EUA). This EUA will remain  in effect (meaning this test can be used) for the  duration of the COVID-19 declaration under Section 564(b)(1) of the Act, 21 U.S.C. section 360bbb-3(b)(1), unless the authorization is terminated or revoked sooner.     Influenza A by PCR NEGATIVE NEGATIVE Final   Influenza B by PCR NEGATIVE NEGATIVE Final    Comment: (NOTE) The Xpert Xpress SARS-CoV-2/FLU/RSV assay is intended as an aid in  the diagnosis of influenza from Nasopharyngeal swab specimens and  should not be used as a sole basis for treatment. Nasal washings and  aspirates are unacceptable for Xpert Xpress SARS-CoV-2/FLU/RSV  testing.  Fact Sheet for Patients: PinkCheek.be  Fact Sheet for Healthcare Providers: GravelBags.it  This test is not yet approved or cleared by the [place] FDA and  has been authorized for detection and/or diagnosis of SARS-CoV-2 by  FDA under an Emergency Use Authorization (EUA). This EUA will remain  in effect (meaning this test can be used) for the duration of the  Covid-19 declaration under Section 564(b)(1) of the Act, 21  U.S.C. section 360bbb-3(b)(1), unless the authorization is  terminated or revoked. Performed at [facility], [place] [address], [place], [place] [address]          Radiology Studies: No results found.      Scheduled Meds: . guaiFENesin  600 mg Oral BID   Continuous Infusions: . morphine 1 mg/hr (06/05/2020 1549)     LOS: 1 day    Time spent: 35 minutes    Edwin Dada, MD [facility] Jun 05, 2020, 4:48 PM     Please page though [place] or Epic secure chat:  For [employer], Adult nurse

## 2020-06-28 NOTE — Consult Note (Signed)
   [facility] Inpatient Consult   2020-05-30  QUINNTIN MALTER 1940-09-08 [id number]  [place] Organization [ACO] Patient: [facility] Medicare  Patient screened for extreme high risk for unplanned readmission score and for 4 hospitalizations in the past 6 months. Patient has been previously outreached by [facility] Management team for post facility follow up in the past and assessed for service needs.  Review of patient's medical record reveals patient is currently under Hospice comfort measures.  Plan:  Patient was active with Manufacturing engineer for hospice care. No current [facility] CM needs noted.  For questions contact:   Natividad Brood, RN BSN [facility] Liaison  [phone] business mobile phone Toll free office [phone]  Fax number: [phone] [email or web address]

## 2020-06-28 NOTE — Progress Notes (Signed)
[facility] Liaison note  [name] is a current hospice patient with [facility] with a CTI of Lung Cancer. Daughter Scott Conley initiated call to EMS due to patient having increased SOB, gasping for air, and stated "I am dying". Per MD patient will be admitted to inpatient. This is a related admission per [facility] MD.  Visited pt at bedside.  Family at bedside. Patient was to transfer to [facility] but was deemed unstable for transfer. Anticipated hospital death. Responds to pain only. Cheyne Stokes resp noted along with cool extremities. NAD noted.    VS: 97.7, 74/52, 99, 100% NRB  Medications: Morphine 1mg /hr continuous   No new diagnostics or labs.  Problem list: #1. Acute on chronic respiratory failure secondary to lung cancer and collapse of right lower lobe. -Start morphine gtt -Likely in hospital death  2. Failure to thrive. Due to lung cancer. Patient has not been eating for 2 weeks. Condition terminal.  3. History of stroke.  4. Severe protein calorie malnutrition. Patient has significantmuscle atrophy, appears very malnourished.  5. Pancytopenia. No additional work-up.  6. Acute kidney injury. No treatmentdue tocomfort care.  DC Planning: anticipated hospital death Family contact: updated at bedside  IDT: updated GOC: DNR, Comfort Care only at this time  Should patient needs ambulance transfer at discharge-please use [facility] as they contract this service for our active hospice patients.  Scott Gordon, RN, CCM       [facility] Liaison (listed on [place] under [facility])     [phone]

## 2020-06-28 DEATH — deceased

## 2020-07-09 NOTE — Progress Notes (Signed)
  Radiation Oncology         [phone] ________________________________  Name: Scott Conley MRN: [id number]  Date: 05/16/2020  DOB: [birth year]  End of Treatment Note  Diagnosis:  Lung cancer     Indication for treatment::  curative       Radiation treatment dates:   03/27/20 - 05/16/20  Site/dose:   The patient was planned to the disease within the right lung initially to a dose of 60 Gy using a 5 field, 3-D conformal technique. The patient then was planned to receive a cone down boost treatment for an additional 6 Gy. This yielded a final total dose of 66 Gy.   Narrative: The patient tolerated radiation treatment relatively well.   The patient's treatement was discontinued after 52 Gy/ 26 fractions.     Plan: The patient has completed radiation treatment. The patient will return to radiation oncology clinic for routine followup in one month. I advised the patient to call or return sooner if they have any questions or concerns related to their recovery or treatment. ________________________________  Jodelle Gross, M.D., Ph.D.

## 2020-07-12 ENCOUNTER — Telehealth: Payer: Self-pay | Admitting: Medical Oncology

## 2020-07-12 NOTE — Telephone Encounter (Signed)
[facility] [place] is confidentially investigating for the state to see if:  1.  Was pt identified with a wound? 2. Was pt ever transported to a facility because of a wound?

## 2020-07-12 NOTE — Telephone Encounter (Signed)
I am not sure but there could be a pressure ulcer.  Thank you.

## 2020-07-13 ENCOUNTER — Telehealth: Payer: Self-pay | Admitting: Medical Oncology

## 2021-06-08 IMAGING — CT CT ABD-PELV W/ CM
2 of 5 series · 12 of 36 positions shown, 15 images · IV contrast (omnipaque)
Comparison: CT chest angiogram, 04/07/2020, PET-CT, 03/22/2020

CLINICAL DATA: Ribcage, intercostal pain for 2 weeks, lung cancer,
current chemotherapy

EXAM:
CT CHEST, ABDOMEN, AND PELVIS WITH CONTRAST
TECHNIQUE: Multidetector CT imaging of the chest, abdomen and pelvis was
performed following the standard protocol during bolus
administration of intravenous contrast.
CONTRAST:  100mL OMNIPAQUE IOHEXOL 300 MG/ML  SOLN

[Series 2: cap with · axial · 0.87mm/px · z∈[-642,-102]mm · 9 of 136 slices shown, 12 images]
[im 14/136  mediastinal]
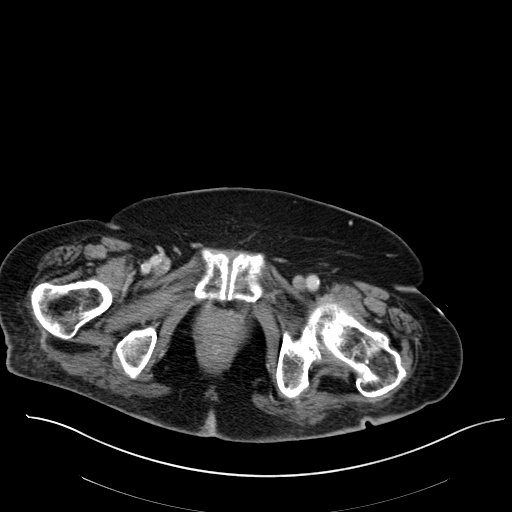
[im 14/136  lung]
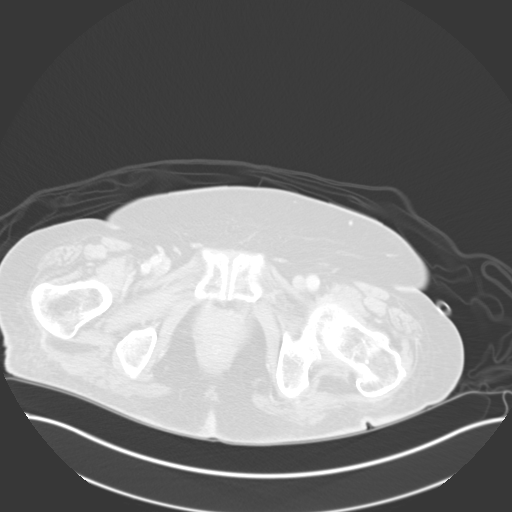
[im 28/136  lung]
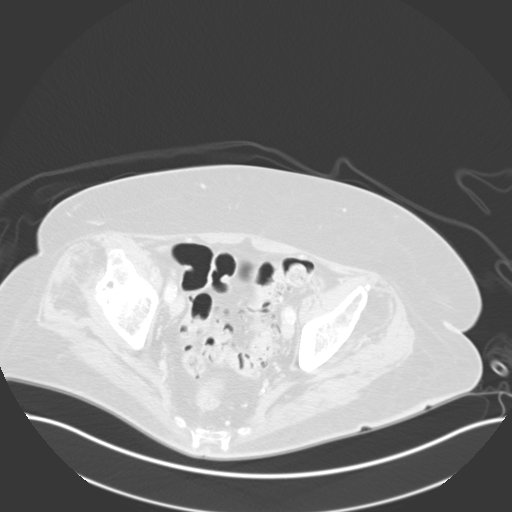
[im 41/136  lung]
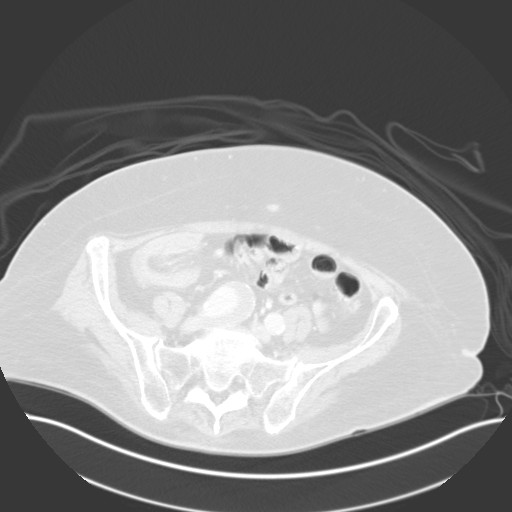
[im 55/136  lung]
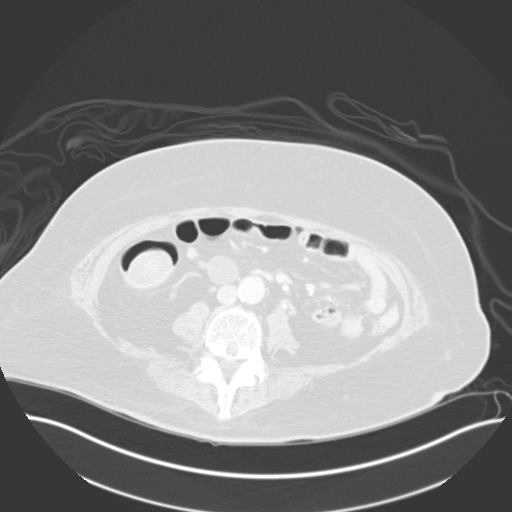
[im 68/136  mediastinal]
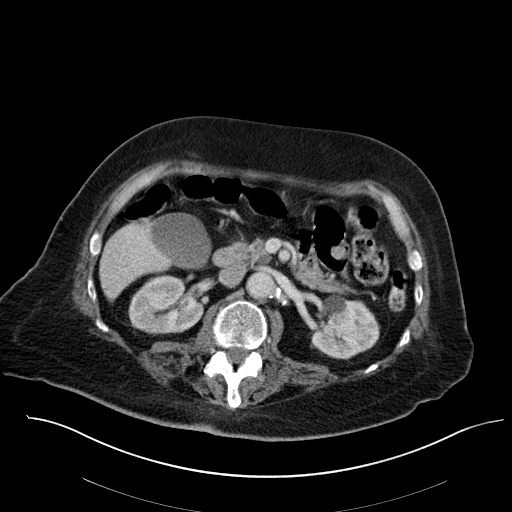
[im 68/136  lung]
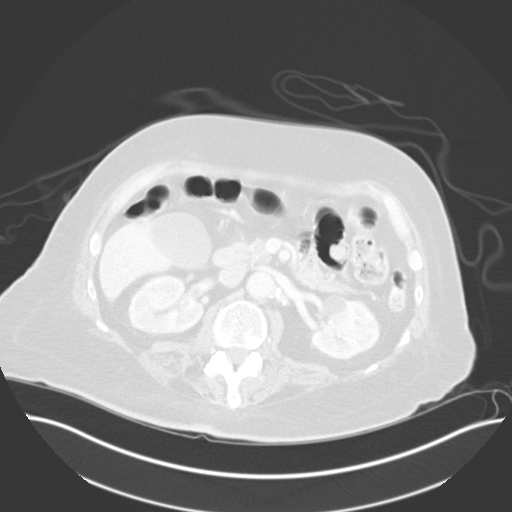
[im 82/136  lung]
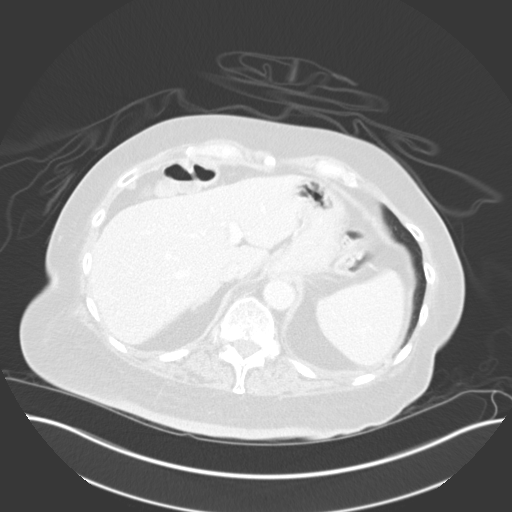
[im 95/136  lung]
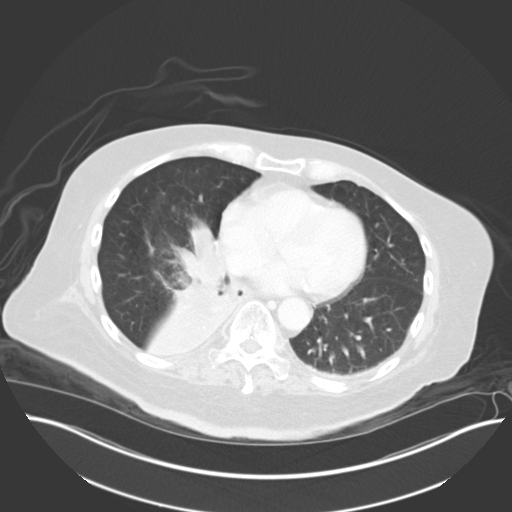
[im 109/136  lung]
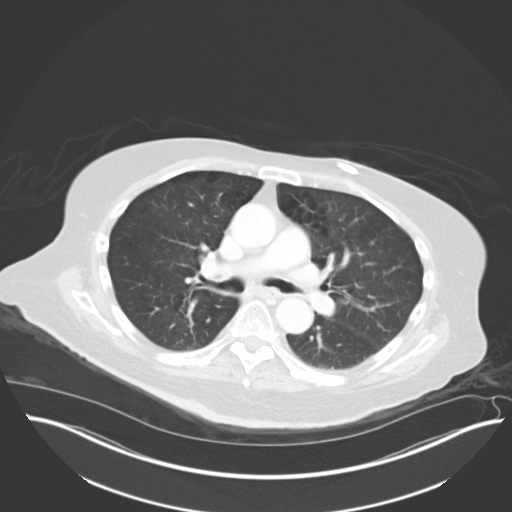
[im 122/136  mediastinal]
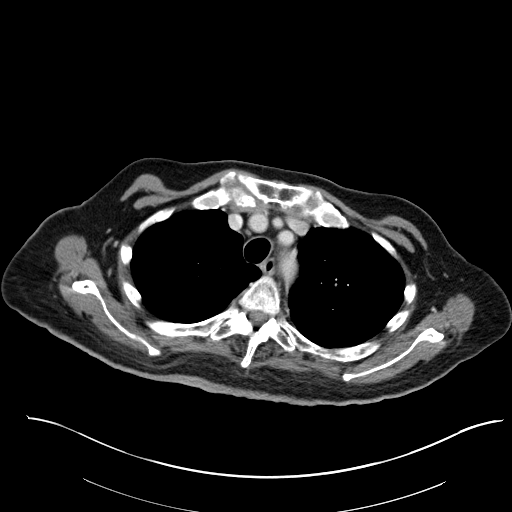
[im 122/136  lung]
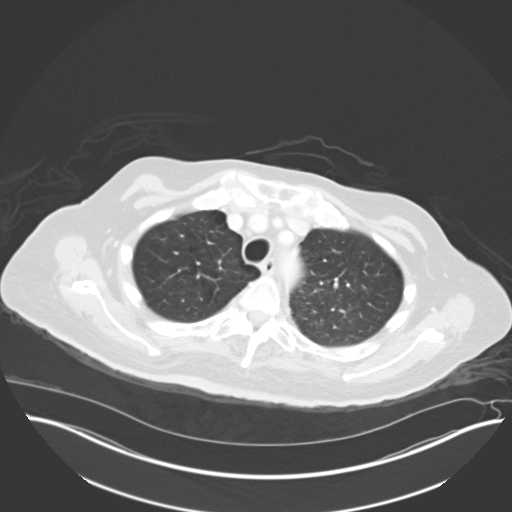

[Series 5: coronals · coronal · 0.89mm/px · 3 of 151 slices shown]
[im 31/151  lung]
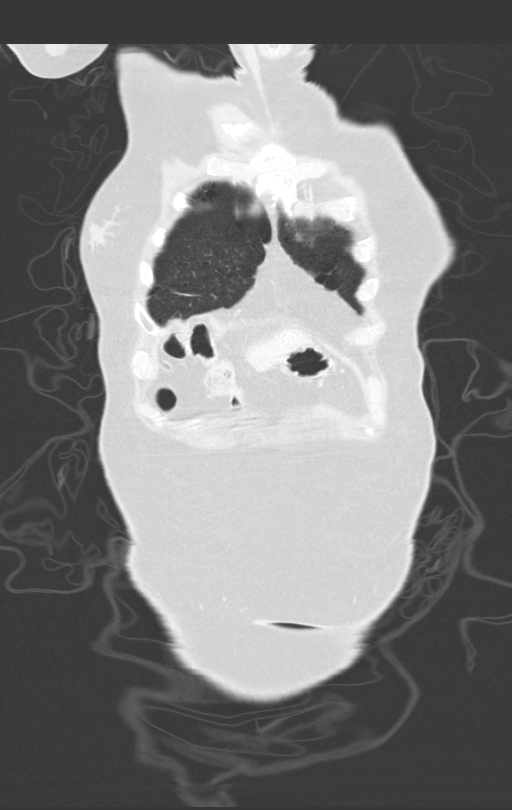
[im 61/151  lung]
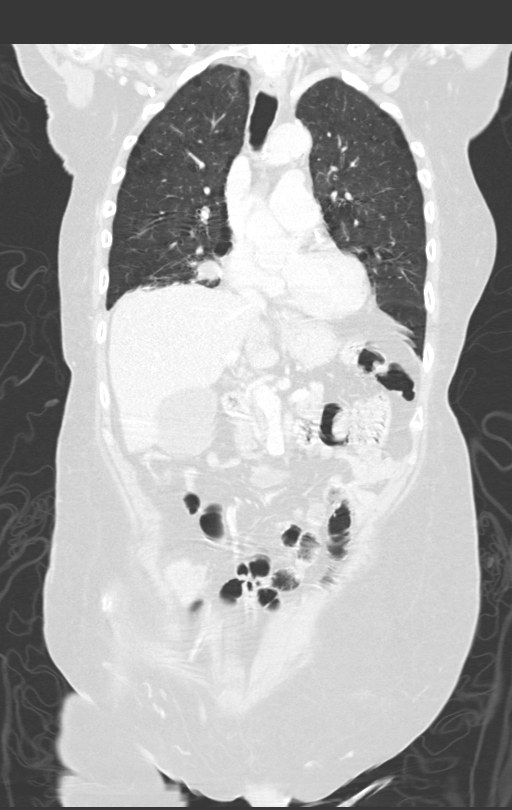
[im 91/151  lung]
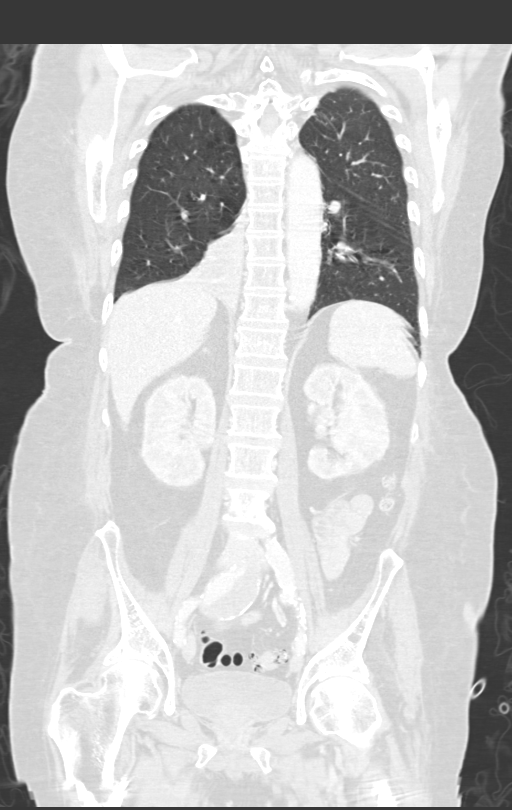

[12 of 36 positions shown; findings below may reference images not displayed]

FINDINGS: CT CHEST FINDINGS

Cardiovascular: Aortic atherosclerosis. Normal heart size.
Three-vessel coronary artery calcifications. No pericardial
effusion.

Mediastinum/Nodes: No remaining enlarged mediastinal, hilar, or
axillary lymph nodes. Small hiatal hernia. Thyroid gland, trachea,
and esophagus demonstrate no significant findings.

Lungs/Pleura: Right hilar mass is slightly decreased in size
compared to prior examination, measuring 6.0 x 5.9 cm, previously
7.4 x 6.7 cm when measured similarly (series 2, image 39). There is
redemonstrated obstruction of the right lower lobe bronchus with
total obstructive atelectasis of the right lower lobe. Unchanged
subpleural nodule of the posteromedial left pulmonary apex measuring
1.2 x 1.1 cm (series 4, image 8). Mild centrilobular and paraseptal
emphysema.

Musculoskeletal: No chest wall mass or suspicious bone lesions
identified.

CT ABDOMEN PELVIS FINDINGS

Hepatobiliary: No solid liver abnormality is seen. No gallstones,
gallbladder wall thickening, or biliary dilatation.

Pancreas: Unremarkable. No pancreatic ductal dilatation or
surrounding inflammatory changes.

Spleen: Normal in size without significant abnormality.

Adrenals/Urinary Tract: Adrenal glands are unremarkable. Kidneys are
normal, without renal calculi, solid lesion, or hydronephrosis.
Bladder is unremarkable.

Stomach/Bowel: Stomach is within normal limits. Appendix appears
normal. There is a sizable ileocecal intussusception, the lead point
of which is likely a intramural lipoma of the terminal ileum more
clearly demonstrated on prior examination (series 5, image 80).
Descending and sigmoid diverticulosis. No evidence of bowel wall
thickening, distention, or inflammatory changes.

Vascular/Lymphatic: Aortic atherosclerosis. There is an aneurysm of
the right common iliac artery measuring approximately 4.0 cm in
caliber with a large burden of mural thrombus (series 5, image 99).
No enlarged abdominal or pelvic lymph nodes.

Reproductive: No mass or other abnormality.

Other: No abdominal wall hernia or abnormality. No abdominopelvic
ascites.

Musculoskeletal: No acute or significant osseous findings.
IMPRESSION: 1. No acute findings to explain right-sided chest pain.
2. Right hilar mass is slightly decreased in size compared to prior
examination, consistent with treatment response. There is
redemonstrated obstruction of the right lower lobe bronchus with
total obstructive atelectasis of the right lower lobe.
3. Unchanged subpleural nodule of the posteromedial left pulmonary
apex measuring 1.2 x 1.1 cm, previously PET avid and concerning for
metastatic nodule versus synchronous primary lesion.
4. No evidence of distant metastatic disease in the abdomen or
pelvis.
5. There is a sizable ileocecal intussusception, the lead point of
which is likely an intramural lipoma of the terminal ileum more
clearly demonstrated on prior examination. Correlate for acutely
referable symptoms and consider colonoscopy to further evaluate and
exclude colon malignancy.
6. There is an aneurysm of the right common iliac artery measuring
approximately 4.0 cm in caliber with a large burden of mural
thrombus, not significantly changed compared to prior examination.
Recommend vascular referral on a nonemergent basis. Aortic
Atherosclerosis ([id number].T).
7. Coronary artery disease.
8. Emphysema ([id number]).
